# Patient Record
Sex: Female | Born: 1940 | Race: White | Hispanic: No | Smoking: Never smoker
Health system: Southern US, Community
[De-identification: ages and names within clinical notes are randomized; demographics above are authoritative.]

## PROBLEM LIST (undated history)

## (undated) DIAGNOSIS — I340 Nonrheumatic mitral (valve) insufficiency: Secondary | ICD-10-CM

## (undated) DIAGNOSIS — M4125 Other idiopathic scoliosis, thoracolumbar region: Secondary | ICD-10-CM

## (undated) DIAGNOSIS — N8111 Cystocele, midline: Secondary | ICD-10-CM

## (undated) DIAGNOSIS — Z9289 Personal history of other medical treatment: Secondary | ICD-10-CM

## (undated) DIAGNOSIS — G629 Polyneuropathy, unspecified: Secondary | ICD-10-CM

## (undated) DIAGNOSIS — G20A1 Parkinson's disease without dyskinesia, without mention of fluctuations: Secondary | ICD-10-CM

## (undated) DIAGNOSIS — M810 Age-related osteoporosis without current pathological fracture: Secondary | ICD-10-CM

## (undated) DIAGNOSIS — I10 Essential (primary) hypertension: Secondary | ICD-10-CM

## (undated) DIAGNOSIS — M1990 Unspecified osteoarthritis, unspecified site: Secondary | ICD-10-CM

## (undated) DIAGNOSIS — G8929 Other chronic pain: Secondary | ICD-10-CM

## (undated) DIAGNOSIS — G20 Parkinson's disease: Secondary | ICD-10-CM

## (undated) DIAGNOSIS — M545 Low back pain, unspecified: Secondary | ICD-10-CM

## (undated) DIAGNOSIS — K219 Gastro-esophageal reflux disease without esophagitis: Secondary | ICD-10-CM

## (undated) DIAGNOSIS — E785 Hyperlipidemia, unspecified: Secondary | ICD-10-CM

## (undated) DIAGNOSIS — K5790 Diverticulosis of intestine, part unspecified, without perforation or abscess without bleeding: Secondary | ICD-10-CM

## (undated) DIAGNOSIS — R2681 Unsteadiness on feet: Secondary | ICD-10-CM

## (undated) DIAGNOSIS — M25519 Pain in unspecified shoulder: Secondary | ICD-10-CM

## (undated) DIAGNOSIS — I480 Paroxysmal atrial fibrillation: Secondary | ICD-10-CM

## (undated) DIAGNOSIS — I341 Nonrheumatic mitral (valve) prolapse: Secondary | ICD-10-CM

## (undated) DIAGNOSIS — R928 Other abnormal and inconclusive findings on diagnostic imaging of breast: Secondary | ICD-10-CM

## (undated) HISTORY — DX: Gastro-esophageal reflux disease without esophagitis: K21.9

## (undated) HISTORY — DX: Age-related osteoporosis without current pathological fracture: M81.0

## (undated) HISTORY — DX: Other abnormal and inconclusive findings on diagnostic imaging of breast: R92.8

## (undated) HISTORY — DX: Personal history of other medical treatment: Z92.89

## (undated) HISTORY — DX: Unsteadiness on feet: R26.81

## (undated) HISTORY — DX: Hyperlipidemia, unspecified: E78.5

## (undated) HISTORY — DX: Low back pain, unspecified: M54.50

## (undated) HISTORY — DX: Diverticulosis of intestine, part unspecified, without perforation or abscess without bleeding: K57.90

## (undated) HISTORY — DX: Parkinson's disease: G20

## (undated) HISTORY — PX: VAGINAL HYSTERECTOMY: SUR661

## (undated) HISTORY — DX: Essential (primary) hypertension: I10

## (undated) HISTORY — PX: TONSILLECTOMY: SUR1361

## (undated) HISTORY — DX: Low back pain: M54.5

## (undated) HISTORY — DX: Cystocele, midline: N81.11

## (undated) HISTORY — DX: Parkinson's disease without dyskinesia, without mention of fluctuations: G20.A1

## (undated) HISTORY — DX: Polyneuropathy, unspecified: G62.9

## (undated) HISTORY — DX: Paroxysmal atrial fibrillation: I48.0

## (undated) HISTORY — DX: Other chronic pain: G89.29

## (undated) HISTORY — DX: Pain in unspecified shoulder: M25.519

## (undated) HISTORY — PX: TRANSTHORACIC ECHOCARDIOGRAM: SHX275

## (undated) HISTORY — DX: Unspecified osteoarthritis, unspecified site: M19.90

## (undated) HISTORY — DX: Nonrheumatic mitral (valve) prolapse: I34.1

## (undated) HISTORY — DX: Other idiopathic scoliosis, thoracolumbar region: M41.25

## (undated) HISTORY — PX: CYSTOCELE REPAIR: SHX163

## (undated) HISTORY — DX: Nonrheumatic mitral (valve) insufficiency: I34.0

---

## 1988-08-04 HISTORY — PX: COMBINED HYSTERECTOMY VAGINAL / OOPHORECTOMY / A&P REPAIR: SUR294

## 2000-01-03 ENCOUNTER — Encounter: Payer: Self-pay | Admitting: Obstetrics and Gynecology

## 2000-01-03 ENCOUNTER — Encounter: Admission: RE | Admit: 2000-01-03 | Discharge: 2000-01-03 | Payer: Self-pay | Admitting: Obstetrics and Gynecology

## 2000-01-08 ENCOUNTER — Other Ambulatory Visit: Admission: RE | Admit: 2000-01-08 | Discharge: 2000-01-08 | Payer: Self-pay | Admitting: Obstetrics and Gynecology

## 2001-02-18 ENCOUNTER — Encounter: Admission: RE | Admit: 2001-02-18 | Discharge: 2001-02-18 | Payer: Self-pay | Admitting: Obstetrics and Gynecology

## 2001-02-18 ENCOUNTER — Encounter: Payer: Self-pay | Admitting: Obstetrics and Gynecology

## 2001-03-01 ENCOUNTER — Encounter: Admission: RE | Admit: 2001-03-01 | Discharge: 2001-03-01 | Payer: Self-pay | Admitting: Internal Medicine

## 2001-03-17 ENCOUNTER — Encounter: Admission: RE | Admit: 2001-03-17 | Discharge: 2001-03-17 | Payer: Self-pay | Admitting: *Deleted

## 2001-03-17 ENCOUNTER — Encounter: Payer: Self-pay | Admitting: *Deleted

## 2001-03-31 ENCOUNTER — Encounter: Admission: RE | Admit: 2001-03-31 | Discharge: 2001-03-31 | Payer: Self-pay | Admitting: *Deleted

## 2001-03-31 ENCOUNTER — Encounter: Payer: Self-pay | Admitting: *Deleted

## 2002-02-02 ENCOUNTER — Other Ambulatory Visit: Admission: RE | Admit: 2002-02-02 | Discharge: 2002-02-02 | Payer: Self-pay | Admitting: Obstetrics and Gynecology

## 2002-02-22 ENCOUNTER — Encounter: Admission: RE | Admit: 2002-02-22 | Discharge: 2002-02-22 | Payer: Self-pay | Admitting: Obstetrics and Gynecology

## 2002-02-22 ENCOUNTER — Encounter: Payer: Self-pay | Admitting: Obstetrics and Gynecology

## 2002-03-01 ENCOUNTER — Encounter: Payer: Self-pay | Admitting: Obstetrics and Gynecology

## 2002-03-01 ENCOUNTER — Encounter: Admission: RE | Admit: 2002-03-01 | Discharge: 2002-03-01 | Payer: Self-pay | Admitting: Obstetrics and Gynecology

## 2002-10-03 HISTORY — PX: COLONOSCOPY: SHX174

## 2003-03-03 ENCOUNTER — Encounter: Admission: RE | Admit: 2003-03-03 | Discharge: 2003-03-03 | Payer: Self-pay | Admitting: Obstetrics and Gynecology

## 2003-03-03 ENCOUNTER — Encounter: Payer: Self-pay | Admitting: Obstetrics and Gynecology

## 2004-03-15 ENCOUNTER — Ambulatory Visit (HOSPITAL_COMMUNITY): Admission: RE | Admit: 2004-03-15 | Discharge: 2004-03-15 | Payer: Self-pay | Admitting: Obstetrics and Gynecology

## 2004-04-24 ENCOUNTER — Encounter: Payer: Self-pay | Admitting: Internal Medicine

## 2005-03-20 ENCOUNTER — Ambulatory Visit (HOSPITAL_COMMUNITY): Admission: RE | Admit: 2005-03-20 | Discharge: 2005-03-20 | Payer: Self-pay | Admitting: Obstetrics and Gynecology

## 2005-04-03 ENCOUNTER — Ambulatory Visit: Payer: Self-pay | Admitting: Internal Medicine

## 2005-04-29 ENCOUNTER — Encounter: Admission: RE | Admit: 2005-04-29 | Discharge: 2005-05-02 | Payer: Self-pay | Admitting: Orthopedic Surgery

## 2005-06-04 ENCOUNTER — Ambulatory Visit: Payer: Self-pay | Admitting: Family Medicine

## 2005-07-10 ENCOUNTER — Encounter: Admission: RE | Admit: 2005-07-10 | Discharge: 2005-07-25 | Payer: Self-pay | Admitting: Orthopedic Surgery

## 2005-09-24 ENCOUNTER — Ambulatory Visit: Payer: Self-pay

## 2006-01-20 ENCOUNTER — Ambulatory Visit: Payer: Self-pay | Admitting: Internal Medicine

## 2006-05-13 ENCOUNTER — Ambulatory Visit (HOSPITAL_COMMUNITY): Admission: RE | Admit: 2006-05-13 | Discharge: 2006-05-13 | Payer: Self-pay | Admitting: Obstetrics and Gynecology

## 2006-05-18 ENCOUNTER — Ambulatory Visit: Payer: Self-pay | Admitting: Internal Medicine

## 2006-05-21 ENCOUNTER — Ambulatory Visit: Payer: Self-pay | Admitting: Internal Medicine

## 2006-06-30 ENCOUNTER — Ambulatory Visit: Payer: Self-pay | Admitting: Internal Medicine

## 2006-06-30 LAB — CONVERTED CEMR LAB
Basophils Absolute: 0 10*3/uL (ref 0.0–0.1)
Hemoglobin: 13.7 g/dL (ref 12.0–15.0)
MCV: 88.3 fL (ref 78.0–100.0)
Neutro Abs: 2.3 10*3/uL (ref 1.4–7.7)
RBC: 4.53 M/uL (ref 3.87–5.11)
RDW: 12 % (ref 11.5–14.6)

## 2006-08-04 DIAGNOSIS — G629 Polyneuropathy, unspecified: Secondary | ICD-10-CM

## 2006-08-04 HISTORY — DX: Polyneuropathy, unspecified: G62.9

## 2007-03-30 ENCOUNTER — Ambulatory Visit: Payer: Self-pay | Admitting: Internal Medicine

## 2007-04-05 LAB — CONVERTED CEMR LAB
AST: 21 units/L (ref 0–37)
Cholesterol: 203 mg/dL (ref 0–200)
Triglycerides: 173 mg/dL — ABNORMAL HIGH (ref 0–149)
VLDL: 35 mg/dL (ref 0–40)

## 2007-04-06 ENCOUNTER — Encounter (INDEPENDENT_AMBULATORY_CARE_PROVIDER_SITE_OTHER): Payer: Self-pay | Admitting: *Deleted

## 2007-04-08 ENCOUNTER — Ambulatory Visit: Payer: Self-pay | Admitting: Internal Medicine

## 2007-04-08 DIAGNOSIS — M545 Low back pain, unspecified: Secondary | ICD-10-CM | POA: Insufficient documentation

## 2007-05-05 ENCOUNTER — Telehealth (INDEPENDENT_AMBULATORY_CARE_PROVIDER_SITE_OTHER): Payer: Self-pay | Admitting: *Deleted

## 2007-05-17 ENCOUNTER — Ambulatory Visit (HOSPITAL_COMMUNITY): Admission: RE | Admit: 2007-05-17 | Discharge: 2007-05-17 | Payer: Self-pay | Admitting: Obstetrics and Gynecology

## 2007-10-26 ENCOUNTER — Ambulatory Visit: Payer: Self-pay | Admitting: Internal Medicine

## 2007-11-01 LAB — CONVERTED CEMR LAB
ALT: 18 units/L (ref 0–35)
AST: 21 units/L (ref 0–37)
Cholesterol: 175 mg/dL (ref 0–200)
HDL: 85 mg/dL (ref >39.0)
Total CHOL/HDL Ratio: 2.1
VLDL: 18 mg/dL (ref 0–40)

## 2007-11-02 ENCOUNTER — Encounter (INDEPENDENT_AMBULATORY_CARE_PROVIDER_SITE_OTHER): Payer: Self-pay | Admitting: *Deleted

## 2007-11-23 ENCOUNTER — Encounter: Payer: Self-pay | Admitting: Internal Medicine

## 2007-11-23 ENCOUNTER — Ambulatory Visit: Payer: Self-pay | Admitting: Internal Medicine

## 2007-11-24 ENCOUNTER — Encounter: Payer: Self-pay | Admitting: Internal Medicine

## 2007-12-09 ENCOUNTER — Telehealth (INDEPENDENT_AMBULATORY_CARE_PROVIDER_SITE_OTHER): Payer: Self-pay | Admitting: *Deleted

## 2008-02-18 ENCOUNTER — Ambulatory Visit (HOSPITAL_BASED_OUTPATIENT_CLINIC_OR_DEPARTMENT_OTHER): Admission: RE | Admit: 2008-02-18 | Discharge: 2008-02-18 | Payer: Self-pay | Admitting: Urology

## 2008-05-17 ENCOUNTER — Ambulatory Visit (HOSPITAL_COMMUNITY): Admission: RE | Admit: 2008-05-17 | Discharge: 2008-05-17 | Payer: Self-pay | Admitting: Obstetrics and Gynecology

## 2008-06-08 ENCOUNTER — Ambulatory Visit: Payer: Self-pay | Admitting: Internal Medicine

## 2008-08-04 HISTORY — PX: BREAST EXCISIONAL BIOPSY: SUR124

## 2008-08-10 ENCOUNTER — Encounter
Admission: RE | Admit: 2008-08-10 | Discharge: 2008-08-10 | Payer: Self-pay | Admitting: Physical Medicine and Rehabilitation

## 2008-08-25 ENCOUNTER — Ambulatory Visit (HOSPITAL_COMMUNITY): Admission: RE | Admit: 2008-08-25 | Discharge: 2008-08-25 | Payer: Self-pay | Admitting: Obstetrics and Gynecology

## 2009-04-03 ENCOUNTER — Ambulatory Visit: Payer: Self-pay | Admitting: Internal Medicine

## 2009-04-03 DIAGNOSIS — M6740 Ganglion, unspecified site: Secondary | ICD-10-CM | POA: Insufficient documentation

## 2009-05-18 ENCOUNTER — Ambulatory Visit (HOSPITAL_COMMUNITY): Admission: RE | Admit: 2009-05-18 | Discharge: 2009-05-18 | Payer: Self-pay | Admitting: Internal Medicine

## 2009-05-29 ENCOUNTER — Telehealth (INDEPENDENT_AMBULATORY_CARE_PROVIDER_SITE_OTHER): Payer: Self-pay | Admitting: *Deleted

## 2009-05-29 ENCOUNTER — Encounter: Admission: RE | Admit: 2009-05-29 | Discharge: 2009-05-29 | Payer: Self-pay | Admitting: Internal Medicine

## 2009-05-30 ENCOUNTER — Encounter: Admission: RE | Admit: 2009-05-30 | Discharge: 2009-05-30 | Payer: Self-pay | Admitting: Internal Medicine

## 2009-05-30 ENCOUNTER — Encounter: Payer: Self-pay | Admitting: Internal Medicine

## 2009-06-04 HISTORY — PX: BREAST SURGERY: SHX581

## 2009-06-04 HISTORY — PX: OTHER SURGICAL HISTORY: SHX169

## 2009-06-11 ENCOUNTER — Encounter: Payer: Self-pay | Admitting: Internal Medicine

## 2009-06-22 ENCOUNTER — Encounter: Payer: Self-pay | Admitting: Internal Medicine

## 2009-06-25 ENCOUNTER — Ambulatory Visit (HOSPITAL_BASED_OUTPATIENT_CLINIC_OR_DEPARTMENT_OTHER): Admission: RE | Admit: 2009-06-25 | Discharge: 2009-06-25 | Payer: Self-pay | Admitting: General Surgery

## 2009-06-25 ENCOUNTER — Encounter: Admission: RE | Admit: 2009-06-25 | Discharge: 2009-06-25 | Payer: Self-pay | Admitting: General Surgery

## 2009-07-10 ENCOUNTER — Encounter: Payer: Self-pay | Admitting: Internal Medicine

## 2009-08-27 ENCOUNTER — Ambulatory Visit (HOSPITAL_COMMUNITY): Admission: RE | Admit: 2009-08-27 | Discharge: 2009-08-27 | Payer: Self-pay | Admitting: Obstetrics and Gynecology

## 2009-09-04 ENCOUNTER — Ambulatory Visit: Payer: Self-pay | Admitting: Internal Medicine

## 2009-09-04 DIAGNOSIS — M1710 Unilateral primary osteoarthritis, unspecified knee: Secondary | ICD-10-CM

## 2009-09-04 DIAGNOSIS — E785 Hyperlipidemia, unspecified: Secondary | ICD-10-CM | POA: Insufficient documentation

## 2009-09-04 DIAGNOSIS — G608 Other hereditary and idiopathic neuropathies: Secondary | ICD-10-CM | POA: Insufficient documentation

## 2009-09-04 DIAGNOSIS — IMO0002 Reserved for concepts with insufficient information to code with codable children: Secondary | ICD-10-CM | POA: Insufficient documentation

## 2009-09-04 DIAGNOSIS — G629 Polyneuropathy, unspecified: Secondary | ICD-10-CM

## 2009-09-04 DIAGNOSIS — R9431 Abnormal electrocardiogram [ECG] [EKG]: Secondary | ICD-10-CM | POA: Insufficient documentation

## 2009-09-04 HISTORY — PX: TOTAL KNEE ARTHROPLASTY: SHX125

## 2009-09-06 ENCOUNTER — Ambulatory Visit: Payer: Self-pay | Admitting: Internal Medicine

## 2009-09-06 DIAGNOSIS — M810 Age-related osteoporosis without current pathological fracture: Secondary | ICD-10-CM | POA: Insufficient documentation

## 2009-09-06 DIAGNOSIS — K5730 Diverticulosis of large intestine without perforation or abscess without bleeding: Secondary | ICD-10-CM | POA: Insufficient documentation

## 2009-09-07 ENCOUNTER — Encounter: Payer: Self-pay | Admitting: Internal Medicine

## 2009-09-07 LAB — CONVERTED CEMR LAB
ALT: 15 units/L (ref 0–35)
AST: 18 units/L (ref 0–37)
Basophils Absolute: 0 10*3/uL (ref 0.0–0.1)
CO2: 30 meq/L (ref 19–32)
Chloride: 109 meq/L (ref 96–112)
Eosinophils Absolute: 0.1 10*3/uL (ref 0.0–0.7)
Eosinophils Relative: 0.8 % (ref 0.0–5.0)
GFR calc non Af Amer: 105.54 mL/min (ref >60)
HDL: 105 mg/dL (ref >39.00)
Lymphocytes Relative: 29.1 % (ref 12.0–46.0)
MCV: 91.5 fL (ref 78.0–100.0)
Monocytes Absolute: 1 10*3/uL (ref 0.1–1.0)
Neutrophils Relative %: 55.6 % (ref 43.0–77.0)
Platelets: 197 10*3/uL (ref 150.0–400.0)
RBC: 4.38 M/uL (ref 3.87–5.11)
RDW: 12.6 % (ref 11.5–14.6)
Sodium: 143 meq/L (ref 135–145)
Total Bilirubin: 0.7 mg/dL (ref 0.3–1.2)
Total Protein: 6.9 g/dL (ref 6.0–8.3)
Triglycerides: 116 mg/dL (ref 0.0–149.0)

## 2009-09-12 ENCOUNTER — Telehealth (INDEPENDENT_AMBULATORY_CARE_PROVIDER_SITE_OTHER): Payer: Self-pay | Admitting: *Deleted

## 2009-09-13 ENCOUNTER — Ambulatory Visit: Payer: Self-pay

## 2009-09-13 ENCOUNTER — Encounter: Payer: Self-pay | Admitting: Cardiology

## 2009-09-13 ENCOUNTER — Ambulatory Visit: Payer: Self-pay | Admitting: Cardiology

## 2009-09-13 ENCOUNTER — Encounter (HOSPITAL_COMMUNITY): Admission: RE | Admit: 2009-09-13 | Discharge: 2009-11-07 | Payer: Self-pay | Admitting: Internal Medicine

## 2009-09-25 ENCOUNTER — Inpatient Hospital Stay (HOSPITAL_COMMUNITY): Admission: RE | Admit: 2009-09-25 | Discharge: 2009-09-27 | Payer: Self-pay | Admitting: Orthopedic Surgery

## 2009-10-23 ENCOUNTER — Encounter: Admission: RE | Admit: 2009-10-23 | Discharge: 2010-01-21 | Payer: Self-pay | Admitting: Orthopedic Surgery

## 2010-01-29 ENCOUNTER — Telehealth (INDEPENDENT_AMBULATORY_CARE_PROVIDER_SITE_OTHER): Payer: Self-pay | Admitting: *Deleted

## 2010-02-14 ENCOUNTER — Ambulatory Visit: Payer: Self-pay | Admitting: Internal Medicine

## 2010-02-14 ENCOUNTER — Telehealth (INDEPENDENT_AMBULATORY_CARE_PROVIDER_SITE_OTHER): Payer: Self-pay | Admitting: *Deleted

## 2010-02-21 ENCOUNTER — Ambulatory Visit: Payer: Self-pay | Admitting: Internal Medicine

## 2010-03-04 HISTORY — PX: CHOLECYSTECTOMY: SHX55

## 2010-03-12 ENCOUNTER — Ambulatory Visit: Payer: Self-pay | Admitting: Family

## 2010-03-13 ENCOUNTER — Telehealth: Payer: Self-pay | Admitting: Family

## 2010-03-13 ENCOUNTER — Encounter: Payer: Self-pay | Admitting: Family

## 2010-03-13 ENCOUNTER — Ambulatory Visit: Payer: Self-pay | Admitting: Radiology

## 2010-03-13 ENCOUNTER — Ambulatory Visit (HOSPITAL_BASED_OUTPATIENT_CLINIC_OR_DEPARTMENT_OTHER)
Admission: RE | Admit: 2010-03-13 | Discharge: 2010-03-13 | Payer: Self-pay | Source: Home / Self Care | Admitting: Internal Medicine

## 2010-03-14 ENCOUNTER — Encounter: Payer: Self-pay | Admitting: Internal Medicine

## 2010-03-14 ENCOUNTER — Telehealth: Payer: Self-pay | Admitting: Family

## 2010-03-14 ENCOUNTER — Telehealth (INDEPENDENT_AMBULATORY_CARE_PROVIDER_SITE_OTHER): Payer: Self-pay | Admitting: *Deleted

## 2010-03-14 LAB — CONVERTED CEMR LAB
Albumin: 4.2 g/dL (ref 3.5–5.2)
Amylase: 91 units/L (ref 0–105)
Basophils Relative: 0 % (ref 0–1)
Bilirubin, Direct: 0.3 mg/dL (ref 0.0–0.3)
Eosinophils Absolute: 0 10*3/uL (ref 0.0–0.7)
Eosinophils Relative: 1 % (ref 0–5)
Lymphs Abs: 1.5 10*3/uL (ref 0.7–4.0)
MCHC: 32.1 g/dL (ref 30.0–36.0)
Monocytes Absolute: 0.9 10*3/uL (ref 0.1–1.0)
Neutro Abs: 3 10*3/uL (ref 1.7–7.7)
Neutrophils Relative %: 55 % (ref 43–77)
Platelets: 201 10*3/uL (ref 150–400)
RBC: 4.28 M/uL (ref 3.87–5.11)

## 2010-03-15 ENCOUNTER — Telehealth: Payer: Self-pay | Admitting: Family

## 2010-04-09 ENCOUNTER — Encounter: Payer: Self-pay | Admitting: Internal Medicine

## 2010-04-10 ENCOUNTER — Ambulatory Visit: Payer: Self-pay | Admitting: Internal Medicine

## 2010-04-11 ENCOUNTER — Telehealth (INDEPENDENT_AMBULATORY_CARE_PROVIDER_SITE_OTHER): Payer: Self-pay | Admitting: *Deleted

## 2010-04-19 ENCOUNTER — Ambulatory Visit: Payer: Self-pay | Admitting: Internal Medicine

## 2010-04-22 LAB — CONVERTED CEMR LAB: Vit D, 25-Hydroxy: 75 ng/mL (ref 30–89)

## 2010-05-20 ENCOUNTER — Ambulatory Visit (HOSPITAL_COMMUNITY): Admission: RE | Admit: 2010-05-20 | Discharge: 2010-05-20 | Payer: Self-pay | Admitting: Internal Medicine

## 2010-08-25 ENCOUNTER — Encounter: Payer: Self-pay | Admitting: Obstetrics and Gynecology

## 2010-09-03 NOTE — Assessment & Plan Note (Signed)
Summary: Cardiology Nuclear Study  Nuclear Med Background Indications for Stress Test: Evaluation for Ischemia, Surgical Clearance, Abnormal EKG  Indications Comments: Pending (R) TKR on 09/25/09 by Dr. Durene Romans  History: Myocardial Perfusion Study, MVP  History Comments: > 5 years ago MPS:no ischemia, EF=60%  Symptoms: DOE, Palpitations    Nuclear Pre-Procedure Cardiac Risk Factors: Family History - CAD, Lipids Caffeine/Decaff Intake: None NPO After: 6:00 PM Lungs: Clear.  O2 Sat 97% on RA. IV 0.9% NS with Angio Cath: 20g     IV Site: (R) AC IV Started by: Stanton Kidney EMT-P Chest Size (in) 36     Cup Size D     Height (in): 63 Weight (lb): 145 BMI: 25.78  Nuclear Med Study 1 or 2 day study:  1 day     Stress Test Type:  Eugenie Birks Reading MD:  Marca Ancona, MD     Referring MD:  Marga Melnick, MD Resting Radionuclide:  Technetium 74m Tetrofosmin     Resting Radionuclide Dose:  10.6 mCi  Stress Radionuclide:  Technetium 21m Tetrofosmin     Stress Radionuclide Dose:  33.0 mCi   Stress Protocol   Lexiscan: 0.4 mg   Stress Test Technologist:  Rea College CMA-N     Nuclear Technologist:  Burna Mortimer Deal RT-N  Rest Procedure  Myocardial perfusion imaging was performed at rest 45 minutes following the intravenous administration of Myoview Technetium 78m Tetrofosmin.  Stress Procedure  The patient received IV Lexiscan 0.4 mg over 15-seconds.  Myoview injected at 30-seconds.  There were no significant changes with lexiscan, only occasional PVC's.  She did c/o of chest fullness.  Quantitative spect images were obtained after a 45 minute delay.  QPS Raw Data Images:  There is a breast shadow that accounts for the anterior attenuation. Stress Images:  Apical perfusion defect Rest Images:  Apical perfusion defect Subtraction (SDS):  Fixed apical defect.  Transient Ischemic Dilatation:  .93  (Normal <1.22)  Lung/Heart Ratio:  .24  (Normal <0.45)  Quantitative Gated Spect  Images QGS EDV:  79 ml QGS ESV:  25 ml QGS EF:  69 % QGS cine images:  Normal wall motion.    Overall Impression  Exercise Capacity: Lexiscan study.  BP Response: Normal blood pressure response. Clinical Symptoms: Chest fullness.  ECG Impression: Occasional PVC, no ischemic changes Overall Impression: Fixed apical perfusion defect with no ischemia.  I suspect this is breast attenuation (significant shadow on planar images) versus apical thinning.  Overall Impression Comments: Probably normal study.   Appended Document: Cardiology Nuclear Study No suggestion of unstable coronary artery disease. OK for Orthopedic surgery 09/25/2009 by Dr Charlann Boxer  Appended Document: Cardiology Nuclear Study mailed

## 2010-09-03 NOTE — Assessment & Plan Note (Signed)
Summary: surgical clearence/ns/kdc   Vital Signs:  Patient profile:   70 year old female Height:      63 inches Weight:      144.8 pounds BMI:     25.74 Temp:     98.1 degrees F oral Pulse rate:   76 / minute Resp:     14 per minute BP sitting:   102 / 66  (left arm) Cuff size:   large  Vitals Entered By: Shonna Chock (September 04, 2009 2:12 PM) CC: Right Knee Replacement surgical clearance, Pre-op Evaluation Comments REVIEWED MED LIST, PATIENT AGREED DOSE AND INSTRUCTION CORRECT    CC:  Right Knee Replacement surgical clearance and Pre-op Evaluation.  History of Present Illness: She is scheduled for R TKR 09/25/2009 by Dr Charlann Boxer  for intractable pain due to "bone on bone"  which affects walking. Rx to date:S/P bilaterall steroid injections & removal of L knee effusion. Glucosamine minimally helpful. Gabapentin has  helped neuropathic pain  in feet but not knee pain .The patient denies respiratory symptoms, GI bleeding, chest pain, edema, PND, heavy ETOH use, and smoking.  Patient has no history of acute or recent MI, unstable or severe angina, decompensated CHF, high grade AV block, symptomatic ventricular arrhythmia, and severe valvular disease.  Positive PMH placing the patient at moderate risk for surgery includes abnormal ECG(l).Her preop(Lumpectomy)  EKG 06/22/2009 : inverted T wave 1 & aVL & unifocal PVCsThese changes were NOT present on 04/24/2004 EKG.Remotely she has had a cardiac stress test which was negative. Lumpectomy was completed w/o Cardiology consult.  Patient has no history of mild angina(m), previous MI(m), compensated CHF(m), diabetes(m), renal insufficiency(m), advanced age(l), and rhythm other than sinus(l).  There is no history of antiplatelet agents, chronic steroids, warfarin, diabetes meds, antianginal meds, and bleeding disorder.    Preventive Screening-Counseling & Management  Alcohol-Tobacco     Smoking Status: never  Caffeine-Diet-Exercise     Does Patient  Exercise: no  Allergies: 1)  ! Sulfa  Past History:  Past Medical History: DDD of back with Lumbar Stenosis ; Motor Neuropathy Hyperlipidemia: NMR 01/2006: LDL 109(1466/578),TG 132,HDL 99. LDL goal = < 100, ideally < 75 Osteoporosis: T score -3.44 2 spine 2001 Diverticulosis, colon  Past Surgical History: epidurals X 2 LS spine; G3 P3; Lumpectomy 06/2009 "fatty necrosis" Tonsillectomy Hysterectomy for "uterine deviation"  Family History: Father: MI @ 55,d CVA @ 81 Mother: ovarian CA d @ 60 Siblings:bro COAD, CAD, Endocarditis   Social History: Retired Married Never Smoked Alcohol use-no Regular exercise-no Does Patient Exercise:  no  Review of Systems General:  Denies fatigue and sleep disorder. Eyes:  Denies blurring, double vision, and vision loss-both eyes. ENT:  Denies difficulty swallowing and hoarseness. CV:  Denies leg cramps with exertion, lightheadness, and near fainting. Resp:  Denies cough, shortness of breath, sputum productive, and wheezing. GI:  Denies abdominal pain, bloody stools, dark tarry stools, and indigestion. GU:  Denies discharge, dysuria, and hematuria. MS:  See HPI; Complains of joint pain and joint swelling; denies joint redness. Derm:  Denies changes in nail beds, dryness, hair loss, and lesion(s). Neuro:  Complains of numbness and tingling; N&T and burning in feet from neuropathy. Endo:  Denies cold intolerance, excessive hunger, excessive thirst, excessive urination, and heat intolerance. Heme:  Denies abnormal bruising and bleeding.  Physical Exam  General:  well-nourished,in no acute distress; alert,appropriate and cooperative throughout examination Head:  Normocephalic and atraumatic without obvious abnormalities.  Neck:  No deformities, masses, or  tenderness noted. Lungs:  Normal respiratory effort, chest expands symmetrically. Lungs are clear to auscultation, no crackles or wheezes. Heart:  Normal rate and regular rhythm. S1 and  S2 normal without gallop,  click, rub. S4.grade 1/2-1  /6 systolic murmur L base.   Abdomen:  Bowel sounds positive,abdomen soft and non-tender without masses, organomegaly or hernias noted. Msk:  No deformity or scoliosis noted of thoracic or lumbar spine.   Pulses:  R and L carotid,radial,dorsalis pedis and posterior tibial pulses are full and equal bilaterally Extremities:  No clubbing, cyanosis, edema. High arches. Crepitus & varus  deviation with ROM R knee Neurologic:  alert & oriented X3.   Skin:  Intact without suspicious lesions or rashes Cervical Nodes:  No lymphadenopathy noted Axillary Nodes:  No palpable lymphadenopathy Psych:  memory intact for recent and remote, normally interactive, and good eye contact.     Impression & Recommendations:  Problem # 1:  OSTEOARTHRITIS, KNEE, RIGHT, SEVERE (ICD-715.96) End stage                                                                                                                                                                                                                              Her updated medication list for this problem includes:    Tramadol Hcl 50 Mg Tabs (Tramadol hcl) .Marland Kitchen... 1 q 6 hrs as needed for  pain  Problem # 2:  ABNORMAL ELECTROCARDIOGRAM (ICD-794.31)  new T wave inversion & unifocal PVCs since 2005  Orders: Cardiology Referral (Cardiology)  Problem # 3:  HYPERLIPIDEMIA (ICD-272.4)  Her updated medication list for this problem includes:    Pravastatin Sodium 20 Mg Tabs (Pravastatin sodium) .Marland Kitchen... 1 by mouth mon, wed, fri  Problem # 4:  ATHEROSCLEROTIC HEART DISEASE, FAMILY HX (ICD-V17.3) Father premature CAD/MI Orders: Cardiology Referral (Cardiology)  Problem # 5:  OSTEOPOROSIS (ICD-733.00)  Problem # 6:  PERIPHERAL NEUROPATHY (ICD-356.9) stable on gabapentin  Complete Medication List: 1)  Pravastatin Sodium 20 Mg Tabs (Pravastatin sodium) .Marland Kitchen.. 1 by mouth mon, wed, fri 2)  Menest 0.625  3)  Calcium  1700mg  Qd  4)  Vit D 4000 Iuqd  5)  Glucosamine Sulfate 2 Tabs Daily  6)  Basa  7)  Tramadol Hcl 50 Mg Tabs (Tramadol hcl) .Marland Kitchen.. 1 q 6 hrs as needed for  pain 8)  Gabapentin 100 Mg Caps (Gabapentin) .Marland Kitchen.. 1 by mouth two times a day 9)  Vitamin C 500 Mg  Tabs (Ascorbic acid) .Marland Kitchen.. 1 by mouth once daily  Patient Instructions: 1)  Fasting labs:BMP ;Hepatic Panel ;Lipid Panel ;CBC w/ Diff. Rest stress test necessary pre op due to new EKG changes, cholesterol elevation, & family history of premature heart disease.

## 2010-09-03 NOTE — Progress Notes (Signed)
Summary: Test Results  Phone Note Outgoing Call   Call placed by: Lemont Fillers FNP,  March 14, 2010 10:27 AM Summary of Call: Please call patient and let her know that her liver function tests are elevated.  I suspect that this is due to her gallbladder.  I would like for her to complete a HIDA scan today if possible.  I have ordered.   I would also like for her to see a surgeon for further evaluation and follow up with Dr. Alwyn Ren on Monday.  If she develops worsening abdominal pain, then she should go to the ER.   Initial call taken by: Lemont Fillers FNP,  March 14, 2010 10:30 AM  Follow-up for Phone Call        call placed to patient at [phone], she has been advised per [facility] instructions Follow-up by: Glendell Docker CMA,  March 14, 2010 11:14 AM

## 2010-09-03 NOTE — Assessment & Plan Note (Signed)
Summary: YEARLY EXAM////SPH   Vital Signs:  Patient profile:   70 year old female Height:      63.5 inches Weight:      149.2 pounds BMI:     26.11 Temp:     97.9 degrees F oral Pulse rate:   76 / minute Resp:     14 per minute BP sitting:   128 / 78  (left arm) Cuff size:   large  Vitals Entered By: Shonna Chock CMA (February 14, 2010 1:06 PM) CC: Yearly, Lipid Management Comments REVIEWED MED LIST, PATIENT AGREED DOSE AND INSTRUCTION CORRECT    CC:  Yearly and Lipid Management.  History of Present Illness: Here for Medicare AWV: 1.Risk factors based on Past M, S, F history:neuropathy;Osteoporosis;dyslipidemia 2.Physical Activities: see Entry 3.Depression/mood: denied 4.Hearing: whisper heard @ 6 ft 5.ADL's: no limitations 6.Fall Risk: see Entry data 7.Home Safety: see data 8.Height, weight, &visual acuity:distant vision WNL reading wall chart 9.Counseling: none requested 10.Labs ordered based on risk factors: see Orders 11.Referral Coordination: Rheumatology referral requested 12. Care Plan: see Instructions; POA up to date 13.Cognitive Assessment: Orientation(X3), memory & recall , mood  WNL. "WORLD" spelled backwards Hyperlipidemia Follow-Up      This is a 70 year old woman who also presents for Hyperlipidemia follow-up.  The patient reports muscle aches, but denies GI upset, abdominal pain, flushing, itching, constipation, diarrhea, and fatigue.  Other symptoms include exercise intolerance due to easy  fatiguability & LBP  with exercise(not @ rest)  and occasioanl palpitations.  The patient denies the following symptoms: chest pain/pressure, dypsnea, syncope, and pedal edema.  Compliance with medications (by patient report) has been near 100%.  Dietary compliance has been fair.  The patient reports no exercise.  Labs & EKG were done pre TKR in 09/2009; labs reviewed  Lipid Management History:      Positive NCEP/ATP III risk factors include female age 20 years old or older  and family history for ischemic heart disease (males less than 76 years old).  Negative NCEP/ATP III risk factors include non-diabetic, HDL cholesterol greater than 60, non-tobacco-user status, non-hypertensive, no ASHD (atherosclerotic heart disease), no prior stroke/TIA, no peripheral vascular disease, and no history of aortic aneurysm.     Preventive Screening-Counseling & Management  Alcohol-Tobacco     Alcohol drinks/day: 0     Smoking Status: never  Caffeine-Diet-Exercise     Caffeine use/day: 1diet  coke qd      Diet Comments: no diet     Does Patient Exercise: no due to hip & back pain  Hep-HIV-STD-Contraception     Dental Visit-last 6 months yes     Sun Exposure-Excessive: no  Safety-Violence-Falls     Seat Belt Use: yes     Firearms in the Home: firearms in the home     Firearm Counseling: not indicated; uses recommended firearm safety measures     Smoke Detectors: yes     Violence in the Home: no risk noted     Sexual Abuse: no     Fall Risk: neuropathy & DJD; wears tennis shoes; PT completed      Sexual History:  currently monogamous.        Drug Use:  never.        Blood Transfusions:  no.        Travel History:  no travel.    Allergies: 1)  ! Sulfa  Past History:  Past Medical History: DDD of back with Lumbar Stenosis ; Motor Neuropathy Hyperlipidemia:  NMR 01/2006: LDL 109(1466/578),TG 132,HDL 99. LDL goal = < 100, ideally < 75. Framingham Study LDL goal = < 160. 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, Dr Charlann Boxer  Family History: Father: MI @ 55,d CVA @ 28 Mother: ovarian cancer, d @ 68 Siblings:bro :COAD, CAD, Endocarditis ; son : MI @ 38(smoker)  Social History: Caffeine use/day:  1diet  coke qd  Does Patient Exercise:  no due to hip &  back pain Dental Care w/in 6 mos.:  yes Sun Exposure-Excessive:  no Seat Belt Use:  yes Fall Risk:  neuropathy & DJD; wears tennis shoes; PT completed Sexual History:  currently monogamous Drug Use:  never Blood Transfusions:  no  Review of Systems  The patient denies anorexia, fever, vision loss, decreased hearing, hoarseness, prolonged cough, headaches, hemoptysis, abdominal pain, melena, hematochezia, severe indigestion/heartburn, hematuria, incontinence, suspicious skin lesions, depression, unusual weight change, abnormal bleeding, enlarged lymph nodes, and angioedema.         Weight up w/o CVE  Physical Exam  General:  well-nourished,in no acute distress; alert,appropriate and cooperative throughout examination Head:  Normocephalic and atraumatic without obvious abnormalities.  Eyes:  No corneal or conjunctival inflammation noted.Perrla. Funduscopic exam benign, without hemorrhages, exudates or papilledema. Ears:  External ear exam shows no significant lesions or deformities.  Otoscopic examination reveals clear canals, tympanic membranes are intact bilaterally without bulging, retraction, inflammation or discharge. Hearing is grossly normal bilaterally. Nose:  External nasal examination shows no deformity or inflammation. Nasal mucosa are pink and moist without lesions or exudates. Mouth:  Oral mucosa and oropharynx without lesions or exudates.  Teeth in good repair. Neck:  No deformities, masses, or tenderness noted. Lungs:  Normal respiratory effort, chest expands symmetrically. Lungs are clear to auscultation, no crackles or wheezes. Heart:  normal rate, regular rhythm, no gallop, no rub, no JVD, no HJR, and grade 1/2-1/6 systolic murmur.   Abdomen:  Bowel sounds positive,abdomen soft and non-tender without masses, organomegaly or hernias noted. Genitalia:  Dr Genice Rouge Msk:  No deformity or scoliosis noted of thoracic or lumbar spine but R lower paraspinus muscles > L.   Pulses:   R and L carotid,radia,dorsalis pedis and posterior tibial pulses are full and equal bilaterally Extremities:  No clubbing, cyanosis, edema, or deformity noted with normal full range of motion of all joints.   Neg SLR to 90 degrees Neurologic:  alert & oriented X3 and DTRs symmetrical and normal.   Skin:  Intact without suspicious lesions or rashes Cervical Nodes:  No lymphadenopathy noted Axillary Nodes:  No palpable lymphadenopathy Psych:  Oriented X3, memory intact for recent and remote, normally interactive, good eye contact, not anxious appearing, and not depressed appearing.     Impression & Recommendations:  Problem # 1:  PREVENTIVE HEALTH CARE (ICD-V70.0)  Orders: Subsequent annual wellness visit with prevention plan ([id number])  Problem # 2:  LOW BACK PAIN, CHRONIC (ICD-724.2)  The following medications were removed from the medication list:    Tramadol Hcl 50 Mg Tabs (Tramadol hcl) .Marland Kitchen... 1 q 6 hrs as needed for  pain  Orders: Orthopedic Referral (Ortho)  Problem # 3:  OSTEOPOROSIS (ICD-733.00)  Problem # 4:  HYPERLIPIDEMIA (ICD-272.4)  Her updated medication list for this problem includes:    Pravastatin Sodium 20 Mg Tabs (Pravastatin sodium) .Marland Kitchen... 1 by  mouth mon, wed, fri  Problem # 5:  PERIPHERAL NEUROPATHY (ICD-356.9)  Complete Medication List: 1)  Pravastatin Sodium 20 Mg Tabs (Pravastatin sodium) .Marland Kitchen.. 1 by mouth mon, wed, fri 2)  Menest 0.625  3)  Calcium 1700mg  Qd  4)  Vit D2 1.25  .... Once weekly 5)  Glucosamine Sulfate 2 Tabs Daily  6)  Basa  7)  Gabapentin 100 Mg Caps (Gabapentin) .Marland Kitchen.. 1am, 1noon, 2 at bedtime 8)  Vitamin C 500 Mg Tabs (Ascorbic acid) .Marland Kitchen.. 1 by mouth once daily  Other Orders: Tdap => 64yrs IM (36644) Admin 1st Vaccine (03474)  Lipid Assessment/Plan:      Based on NCEP/ATP III, the patient's risk factor category is "0-1 risk factors".  The patient's lipid goals are as follows: Total cholesterol goal is 200; LDL cholesterol goal is 160; HDL  cholesterol goal is 40; Triglyceride goal is 150.  Her LDL cholesterol goal has been met.    Patient Instructions: 1)  BMD every 25 months(scheduled 07/21).Take an Aspirin every day.Keep appt with Dr Ethelene Hal.Check vitamin D level annually @ Gyn   Immunizations Administered:  Tetanus Vaccine:    Vaccine Type: Tdap    Site: right deltoid    Mfr: GlaxoSmithKline    Dose: 0.5 ml    Route: IM    Given by: Shonna Chock CMA    Exp. Date: 10/27/2011    Lot #: [id number]    VIS given: 06/22/07 version given February 14, 2010.

## 2010-09-03 NOTE — Assessment & Plan Note (Signed)
Summary: stomache pain /cbs per chrae--Rm 5   Vital Signs:  Patient profile:   70 year old female Height:      63.5 inches Weight:      149.25 pounds BMI:     26.12 Temp:     97.5 degrees F oral Pulse rate:   84 / minute Pulse rhythm:   regular Resp:     16 per minute BP sitting:   124 / 78  (right arm) Cuff size:   regular  Vitals Entered By: Cassie White CMA Cassie White) (March 12, 2010 3:04 PM) CC: Room 5   Pt states she had episode of severe abdominal pain around noon today, very sharp & stabbing. Also noticed some nausea, symptoms seem to be easing now. Is Patient Diabetic? No Pain Assessment Patient in pain? yes     Location: abdomen Intensity: 10 Type: stabbing Onset of pain  noon today   CC:  Room 5   Pt states she had episode of severe abdominal pain around noon today, very sharp & stabbing. Also noticed some nausea, and symptoms seem to be easing now..  History of Present Illness: Cassie White is a 70 year old female here today with complaint of abdominal pain. Oset of symptoms was  24 hours ago. Course has been sudden onset and now occurs in intermittent  pattern. Problem precipitated by prolonged sitting or empty stomach. Symptom characterized as stabbing pain in her epigastric area- which today has lasted approximately 3 hours- now completely resolved at time of examination. Symptom radiates to her back- she denies chest pain or shortness of breath. Problem associated with  nausea. Symptoms are  improved by nothing. Patietn denies prior hx of same symptoms. Denies fever.  Last BM was this AM- denies melena or BRBPR.  Denies NSAID use. Denies abdominal distension.     Allergies: 1)  ! Sulfa  Past History:  Past Medical History: Last updated: 02/14/2010 DDD of back with Lumbar Stenosis ; Motor Neuropathy Hyperlipidemia: NMR 01/2006: LDL 109(1466/578),TG 132,HDL 99. LDL goal = < 100, ideally < 75. Framingham Study LDL goal = < 160. 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 Father: MI @ 55,d CVA @ 15 Mother: ovarian cancer, d @ 45 Siblings:bro :COAD, CAD, Endocarditis ; son : MI @ 38(smoker)  Social History: Reviewed history from 09/04/2009 and no changes required. Retired Married Never Smoked Alcohol use-no Regular exercise-no  Review of Systems       see HPI  Physical Exam  General:  Well-developed,well-nourished,in no acute distress; alert,appropriate and cooperative throughout examination Lungs:  Normal respiratory effort, chest expands symmetrically. Lungs are clear to auscultation, no crackles or wheezes. Heart:  Normal rate and regular rhythm. S1 and S2 normal without gallop, murmur, click, rub or other  extra sounds. Abdomen:  Abdomen is soft, non-tender, non-distended.  + BS's, negative murphy's sign.  No acute abdominal findings. Psych:  Cognition and judgment appear intact. Alert and cooperative with normal attention span and concentration. No apparent delusions, illusions, hallucinations   Impression & Recommendations:  Problem # 1:  EPIGASTRIC PAIN (ICD-789.06) Assessment New EKG notes normal sinus rythm  without acute changes.  Will send for abdominal ultrasound and below laboratories.  Differential includes acute cholecystitis, pancreatitis, PUD/GERD.  Will also start empiric PPI, pt instructed to call if recurrent symptoms.  Otherwise, plan f/u in 1 month with Dr. Alwyn Ren.  Can consider GI referral at that time. Orders: T-CBC w/Diff [phone]) T-Amylase [phone]) T-Lipase [phone]) T-Hepatic Function [phone]) EKG w/ Interpretation (93000) Abdomen Ultrasound ([facility])  Complete Medication List: 1)  Pravastatin Sodium 20 Mg Tabs (Pravastatin sodium) .Marland Kitchen.. 1 by mouth mon, wed, fri 2)  Menest 0.625  3)  Calcium 1700mg  Daily  4)  Vit D2 1.25  .... Once weekly 5)  Glucosamine Sulfate 2 Tabs Daily  6)  Ecotrin Low Strength 81 Mg Tbec (Aspirin) .... Take 1 tablet by mouth once a day 7)  Gabapentin 100 Mg Caps (Gabapentin) .Marland Kitchen.. 1am, 1noon, 2 at bedtime 8)  Vitamin C 500 Mg Tabs (Ascorbic acid) .Marland Kitchen.. 1 by mouth once daily 9)  Methocarbamol 500 Mg Tabs (Methocarbamol) .... As needed. 10)  Prilosec Otc 20 Mg Tbec (Omeprazole magnesium) .... 2 tabs by mouth daily  Patient Instructions: 1)  Start prilosec. 2)  Complete your blood work downstairs today. 3)  Complete your ultrasound in the AM tomorrow. 4)  Go to ER if you develop recurrent/worsening abdominal pain, black or bloody stools. 5)  Call if you develop fever. 6)  Follow up with Dr. Alwyn Ren in 2 weeks, sooner if symptoms worsen.  Current Allergies (reviewed today): ! SULFA

## 2010-09-03 NOTE — Progress Notes (Signed)
  Phone Note Outgoing Call   Call placed by: Lemont Fillers FNP,  March 15, 2010 1:32 PM Call placed to: Patient Summary of Call: Called patient to see how she is feeling.  She reports that she is feeling well.  She saw Dr. Michaell Cowing yesterday and he has recommended cholecystectomy which is scheduled for next Friday. Initial call taken by: Lemont Fillers FNP,  March 15, 2010 1:33 PM

## 2010-09-03 NOTE — Progress Notes (Signed)
Summary: Refill Request  Phone Note Refill Request Message from:  Patient on February 14, 2010 2:58 PM  Refills Requested: Medication #1:  PRAVASTATIN SODIUM 20 MG  TABS 1 by mouth mon   Dosage confirmed as above?Dosage Confirmed   [facility] [place]  Next Appointment Scheduled: none Initial call taken by: Lavell Islam,  February 14, 2010 2:58 PM    Prescriptions: PRAVASTATIN SODIUM 20 MG  TABS (PRAVASTATIN SODIUM) 1 by mouth mon, wed, fri  #90 Each x 2   Entered by:   Shonna Chock CMA   Authorized by:   Marga Melnick MD   Signed by:   Shonna Chock CMA on 02/14/2010   Method used:   [facility]ronically to        [phone][facility] (retail)       [address]       [place], [place]  [address]       Ph: [phone] or [phone]       Fax: [phone]   RxID:   [id number]

## 2010-09-03 NOTE — Assessment & Plan Note (Signed)
Summary: DISCUSS TREATMENT/KN   Vital Signs:  Patient profile:   70 year old female Weight:      144.4 pounds BMI:     25.27 Temp:     97.6 degrees F oral Pulse rate:   72 / minute Resp:     14 per minute BP sitting:   100 / 66  (left arm) Cuff size:   regular  Vitals Entered By: Shonna Chock CMA (April 10, 2010 9:33 AM) CC: Discuss Osteoprosis treatment   CC:  Discuss Osteoprosis treatment.  History of Present Illness: BMD reviewed : frank Osteoporosis @ spine & femoral neck. Ca++ 9.7 in 09/2009. T score was -3.44 in 2001  & now -2.7 in 2011. No vit D on record; she is taking 500 International Units once daily of vitamin D?3. Walking has been curtailed because of surgeries. PMH fractured arm age 79. No  FH  Osteoporosis. PMH of bone building therapy (Fosamax ) X  years.. No significant GI issues or symptoms.  Current Medications (verified): 1)  Pravastatin Sodium 20 Mg  Tabs (Pravastatin Sodium) .Marland Kitchen.. 1 By Mouth Mon, Wed, Fri 2)  Menest 0.625 3)  Calcium 1700mg  Daily 4)  Vit D2 1.25 .... Once Weekly 5)  Glucosamine Sulfate 2 Tabs Daily 6)  Ecotrin Low Strength 81 Mg Tbec (Aspirin) .... Take 1 Tablet By Mouth Once A Day 7)  Gabapentin 100 Mg Caps (Gabapentin) .Marland Kitchen.. 1am, 1noon, 2 At Bedtime 8)  Vitamin C 500 Mg Tabs (Ascorbic Acid) .Marland Kitchen.. 1 By Mouth Once Daily 9)  Methocarbamol 500 Mg Tabs (Methocarbamol) .... As Needed. 10)  Prilosec Otc 20 Mg Tbec (Omeprazole Magnesium) .... 2 Tabs By Mouth Daily  Allergies: 1)  ! Sulfa  Past History:  Past Medical History: DDD of back with Lumbar Stenosis ; Motor Neuropathy Hyperlipidemia: NMR 01/2006: LDL 109(1466/578),TG 132,HDL 99. LDL goal = < 100, ideally < 75. Framingham Study LDL goal = < 160. Osteoporosis: T score -3.44 @ spine in  2001; post ? 10 years of Fosamax . T score - 2.7  @ spine 02/2010 & -2.8 @ femoral neck. 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 Slightly decreased ROM R knee. Crepitus bilaterally. No significant DJD of hands   Impression & Recommendations:  Problem # 1:  OSTEOPOROSIS (ICD-733.00)  S/P > 5 years Bisphosphonate therapy; serial BMDs improved but Osteoporosis persists.  Orders: Venipuncture (16010) T-Vitamin D (25-Hydroxy) [phone])  Complete Medication List: 1)  Pravastatin Sodium 20 Mg Tabs (Pravastatin sodium) .Marland Kitchen.. 1 by mouth mon, wed, fri 2)  Menest 0.625  3)  Calcium 1700mg  Daily  4)  Vit D2 1.25  .... Once weekly 5)  Glucosamine Sulfate 2 Tabs Daily  6)  Ecotrin Low Strength 81 Mg Tbec (Aspirin) .... Take 1 tablet by mouth once a day 7)  Gabapentin 100 Mg Caps (Gabapentin) .Marland Kitchen.. 1am, 1noon, 2 at bedtime 8)  Vitamin C 500 Mg Tabs (Ascorbic acid) .Marland Kitchen.. 1 by mouth once daily 9)  Methocarbamol 500 Mg Tabs (Methocarbamol) .... As needed. 10)  Prilosec Otc 20 Mg Tbec (Omeprazole magnesium) .... 2 tabs by mouth daily  Patient Instructions: 1)  Ca++ 600 mg two times a day . Walking 30 min3-4X/ week. BMD every 25 months. (Note: on vit D 2   500 International Units once daily ). IV agents for Osteoporosis if it progresses off Fosamax.

## 2010-09-03 NOTE — Progress Notes (Signed)
Summary: Labs Due Per Dr.Hopper  Phone Note Outgoing Call Call back at [facility] Phone [phone]   Call placed by: Shonna Chock CMA,  March 14, 2010 4:39 PM Call placed to: Patient Details for Reason: Lab Appointment Due Summary of Call: Left message on VM informing patient to call to schedule lab appointment for tomorrow per Dr.Hopper  Recommend fasting LFTs , amylase & lipase in am 08/12 (789.00) Chrae Malloy CMA  March 14, 2010 4:39 PM

## 2010-09-03 NOTE — Letter (Signed)
Summary: Surgical Clearance/[facility]  Surgical Clearance/[facility]   Imported By: Lanelle Bal 09/14/2009 08:58:21  _____________________________________________________________________  External Attachment:    Type:   Image     Comment:   External Document

## 2010-09-03 NOTE — Progress Notes (Signed)
Summary: u/s result  Phone Note Outgoing Call   Call placed by: Lemont Fillers FNP,  March 13, 2010 1:10 PM Call placed to: Patient Summary of Call: Left message on patient's cell phone for her to call us to discuss her ultrasound results.  Ultrasound shows gallstones- which may or may not be causing her pain.  If she is still having symptoms I would like for her to complete a HIDA scan which would give [place] more info o whether or not these gallstones are really contributing to her pain.  Also,  did she complete lab work?  If not I would like her to do so please. Initial call taken by: Lemont Fillers FNP,  March 13, 2010 1:12 PM  Follow-up for Phone Call        Pt advised per [facility] instructions. Pt states she has not had pain since she saw [place] last. Has appt today at 3pm with surgeon.  Nicki Guadalajara Fergerson CMA Duncan Dull)  March 14, 2010 12:56 PM

## 2010-09-03 NOTE — Consult Note (Signed)
Summary: [facility]   Imported By: Lanelle Bal 04/01/2010 13:10:21  _____________________________________________________________________  External Attachment:    Type:   Image     Comment:   External Document

## 2010-09-03 NOTE — Progress Notes (Signed)
Summary: Nuclear Pre-Procedure  Phone Note Outgoing Call Call back at [facility] Phone [phone]   Call placed by: Stanton Kidney, EMT-P,  September 12, 2009 2:50 PM Call placed to: Patient Action Taken: Phone Call Completed Summary of Call: Reviewed information on Myoview Information Sheet (see scanned document for further details).  Spoke with Patient.    Nuclear Med Background Indications for Stress Test: Evaluation for Ischemia, Surgical Clearance, Abnormal EKG  Indications Comments: Pending (R) TKR: 09/25/09; Dr. Luna Fuse  History: Myocardial Perfusion Study, MVP  History Comments: > 5 years ago MPS: EF=60%, (-) ischemia     Nuclear Pre-Procedure Cardiac Risk Factors: Family History - CAD, Lipids Height (in): 63

## 2010-09-03 NOTE — Progress Notes (Signed)
Summary: INFO ON VIT D2 PER REQUEST FROM DR HOPPER  Phone Note Call from Patient Call back at Home Phone [phone]   Caller: Patient Summary of Call: PATIENT SAYS SHE SAW DR HOPPER RECEENTLY AND HE ASKED HER ABOUT VIT D THAT SHE WAS TAKING  SHE IS TAKING VIT D2--1.25 MG   SAYS BOTTLE SAYS 50,000 UNITS ---TAKES PILL ONE TIME PER WEEK Initial call taken by: Jerolyn Shin,  April 11, 2010 2:39 PM  Follow-up for Phone Call        pt aware med list updated.............Marland KitchenFelecia Deloach CMA  April 12, 2010 9:06 AM     New/Updated Medications: VITAMIN D (ERGOCALCIFEROL) 50000 UNIT CAPS (ERGOCALCIFEROL) Take 1 tab once weekly

## 2010-09-03 NOTE — Miscellaneous (Signed)
Summary: BONE DENSITY  Clinical Lists Changes  Orders: Added new Test order of T-Bone Densitometry (77080) - Signed Added new Test order of T-Lumbar Vertebral Assessment (77082) - Signed 

## 2010-09-03 NOTE — Progress Notes (Signed)
Summary: NEED LABS FOR YEARLY EXAM  Phone Note Call from Patient   Caller: Patient Summary of Call: PATIENT HAS BEEN SCHEDULED FOR A YEARLY EXAM ON 7/14, SO HER LABS ARE FOR 7/7 AT 8AM  WHAT LABS DO YOU NEED FOR HER??  LET ME KNOW AND I WILL ADD THEM IN.....THANKS Initial call taken by: Jerolyn Shin,  January 29, 2010 5:03 PM  Follow-up for Phone Call        Patient had surgical [place] 09/2009 and had labs drawn. No repeat on labs indicated. If patient needs any additional labs Dr.Hopper will indicate at OV Follow-up by: Shonna Chock,  January 30, 2010 8:34 AM  Additional Follow-up for Phone Call Additional follow up Details #1::        SPOKE TO MS Berrocal--ADVISED HER THAT SHE DID NOT NEED LAB APPOINTMENT; SHE ONLY NEEDED THE APPOINTMENT WITH DR HOPPER ON 7/14---PATIENT SAID THAT WAS OK Additional Follow-up by: Jerolyn Shin,  February 05, 2010 9:24 AM

## 2010-09-03 NOTE — Consult Note (Signed)
Summary: [facility]   Imported By: Lanelle Bal 04/29/2010 10:23:58  _____________________________________________________________________  External Attachment:    Type:   Image     Comment:   External Document

## 2010-09-19 ENCOUNTER — Inpatient Hospital Stay: Admit: 2010-09-19 | Payer: Self-pay | Admitting: Internal Medicine

## 2010-09-19 ENCOUNTER — Observation Stay (HOSPITAL_COMMUNITY)
Admission: AD | Admit: 2010-09-19 | Discharge: 2010-09-21 | DRG: 641 | Disposition: A | Discharge status: Home / Self Care | Payer: Medicare Other | Source: Other Acute Inpatient Hospital | Attending: Internal Medicine | Admitting: Internal Medicine

## 2010-09-19 ENCOUNTER — Emergency Department (HOSPITAL_BASED_OUTPATIENT_CLINIC_OR_DEPARTMENT_OTHER)
Admission: EM | Admit: 2010-09-19 | Discharge: 2010-09-19 | Disposition: A | Discharge status: Other Acute Inpatient Hospital | Payer: Medicare Other | Source: Home / Self Care | Attending: Emergency Medicine | Admitting: Emergency Medicine

## 2010-09-19 ENCOUNTER — Emergency Department (INDEPENDENT_AMBULATORY_CARE_PROVIDER_SITE_OTHER): Payer: Medicare Other

## 2010-09-19 DIAGNOSIS — E7800 Pure hypercholesterolemia, unspecified: Secondary | ICD-10-CM | POA: Diagnosis present

## 2010-09-19 DIAGNOSIS — M810 Age-related osteoporosis without current pathological fracture: Secondary | ICD-10-CM | POA: Diagnosis present

## 2010-09-19 DIAGNOSIS — Z79899 Other long term (current) drug therapy: Secondary | ICD-10-CM | POA: Insufficient documentation

## 2010-09-19 DIAGNOSIS — E538 Deficiency of other specified B group vitamins: Secondary | ICD-10-CM | POA: Diagnosis present

## 2010-09-19 DIAGNOSIS — Z882 Allergy status to sulfonamides status: Secondary | ICD-10-CM

## 2010-09-19 DIAGNOSIS — R42 Dizziness and giddiness: Secondary | ICD-10-CM | POA: Insufficient documentation

## 2010-09-19 DIAGNOSIS — R269 Unspecified abnormalities of gait and mobility: Secondary | ICD-10-CM | POA: Diagnosis present

## 2010-09-19 DIAGNOSIS — IMO0002 Reserved for concepts with insufficient information to code with codable children: Secondary | ICD-10-CM | POA: Insufficient documentation

## 2010-09-19 DIAGNOSIS — I059 Rheumatic mitral valve disease, unspecified: Secondary | ICD-10-CM | POA: Diagnosis present

## 2010-09-19 DIAGNOSIS — G589 Mononeuropathy, unspecified: Secondary | ICD-10-CM | POA: Insufficient documentation

## 2010-09-19 DIAGNOSIS — Z7982 Long term (current) use of aspirin: Secondary | ICD-10-CM

## 2010-09-19 DIAGNOSIS — M1990 Unspecified osteoarthritis, unspecified site: Secondary | ICD-10-CM | POA: Insufficient documentation

## 2010-09-19 DIAGNOSIS — K5730 Diverticulosis of large intestine without perforation or abscess without bleeding: Secondary | ICD-10-CM | POA: Diagnosis present

## 2010-09-19 DIAGNOSIS — Z96659 Presence of unspecified artificial knee joint: Secondary | ICD-10-CM

## 2010-09-19 DIAGNOSIS — E785 Hyperlipidemia, unspecified: Secondary | ICD-10-CM | POA: Diagnosis present

## 2010-09-19 LAB — PROTIME-INR
INR: 0.89 (ref 0.00–1.49)
Prothrombin Time: 12.3 seconds (ref 11.6–15.2)

## 2010-09-19 LAB — COMPREHENSIVE METABOLIC PANEL
Albumin: 4.3 g/dL (ref 3.5–5.2)
CO2: 25 mEq/L (ref 19–32)
GFR calc Af Amer: >60 mL/min (ref >60)
GFR calc non Af Amer: >60 mL/min (ref >60)
Sodium: 145 mEq/L (ref 135–145)
Total Bilirubin: 0.6 mg/dL (ref 0.3–1.2)
Total Protein: 7.6 g/dL (ref 6.0–8.3)

## 2010-09-19 LAB — CBC
HCT: 39.7 % (ref 36.0–46.0)
Hemoglobin: 13.6 g/dL (ref 12.0–15.0)
Platelets: 194 10*3/uL (ref 150–400)
WBC: 5.1 10*3/uL (ref 4.0–10.5)

## 2010-09-19 LAB — HEMOGLOBIN A1C
Hgb A1c MFr Bld: 5.3 % (ref <5.7)
Mean Plasma Glucose: 105 mg/dL (ref <117)

## 2010-09-19 LAB — POCT CARDIAC MARKERS: CKMB, poc: <1 ng/mL — ABNORMAL LOW (ref 1.0–8.0)

## 2010-09-20 ENCOUNTER — Inpatient Hospital Stay (HOSPITAL_COMMUNITY): Payer: Medicare Other

## 2010-09-20 DIAGNOSIS — I359 Nonrheumatic aortic valve disorder, unspecified: Secondary | ICD-10-CM

## 2010-09-20 LAB — CBC
Hemoglobin: 12.5 g/dL (ref 12.0–15.0)
MCHC: 33.3 g/dL (ref 30.0–36.0)
MCV: 89.3 fL (ref 78.0–100.0)
Platelets: 181 10*3/uL (ref 150–400)

## 2010-09-20 LAB — BASIC METABOLIC PANEL
CO2: 23 mEq/L (ref 19–32)
Calcium: 8.9 mg/dL (ref 8.4–10.5)
Creatinine, Ser: 0.66 mg/dL (ref 0.4–1.2)
GFR calc Af Amer: >60 mL/min (ref >60)

## 2010-09-20 LAB — URINALYSIS, ROUTINE W REFLEX MICROSCOPIC
Protein, ur: NEGATIVE mg/dL
Urine Glucose, Fasting: NEGATIVE mg/dL
Urobilinogen, UA: 0.2 mg/dL (ref 0.0–1.0)

## 2010-09-20 LAB — LIPID PANEL
LDL Cholesterol: 68 mg/dL (ref 0–99)
VLDL: 18 mg/dL (ref 0–40)

## 2010-09-23 NOTE — Discharge Summary (Signed)
Cassie White, Cassie White               ACCOUNT NO.:  [id number]  MEDICAL RECORD NO.:  [id number]           PATIENT TYPE:  I  LOCATION:  3008                         FACILITY:  [facility]  PHYSICIAN:  Cassie White, M.D. DATE OF BIRTH:  06-10-1941  DATE OF ADMISSION:  09/19/2010 DATE OF DISCHARGE:  09/21/2010                              DISCHARGE SUMMARY   PRIMARY CARE PHYSICIAN:  Cassie White. Cassie Ren, MD, FACP, FCCP  DISCHARGE DIAGNOSES: 1. Gait imbalance/dizziness, likely secondary to vitamin B12     deficiency. 2. Vitamin B12 deficiency. 3. Hyperlipidemia. 4. Mitral valve prolapse. 5. History of degenerative disk disease and osteoarthritis. 6. History of osteoporosis. 7. History of diverticulosis.  DISCHARGE MEDICATIONS: 1. Vitamin B12 1000 mcg injected intramuscularly daily for 5 days,     weekly for a month, and then monthly thereafter. 2. Neurontin 100 mg 2 tablets 3 times a day. 3. Pravastatin 20 mg on Monday, Wednesday, Friday. 4. Calcium 500 mg 1 tablet twice daily. 5. Vitamin D2 50,000 units weekly on Fridays. 6. Menest 0.625 mg 1 tablet daily. 7. Vitamin C 500 mg daily. 8. Glucosamine 2 tablets daily. 9. Aspirin 81 mg daily. 10.Fish oil 1 capsule daily. 11.Biotin 500 mg daily. 12.Tylenol Arthritis 2 tablets at bedtime for pain.  DISPOSITION AND FOLLOWUP:  Cassie White will be discharged home today in stable and improved condition.  She will need to perform a vitamin B12 injections as stated above.  I have recommended that she follow up with her PCP, Dr. Alwyn White in 3-4 weeks.  CONSULTATION THIS HOSPITALIZATION:  None.  IMAGES AND PROCEDURES: 1. A CT scan of the head on February 16 that showed no acute     abnormality with mild atrophy and minimal chronic small vessel     white matter ischemic changes. 2. MRI of the brain on February 17 that showed no acute findings with     no specific cause of the patient's described symptoms with minimal     small-vessel change  of the hemispheric white matter, less often     than seen in healthy individuals of this age. 3. The patient had a 2-D echocardiogram on February 17 that showed an     ejection fraction of 55-60% with grade 1 diastolic dysfunction with     mitral valve prolapse and moderate mitral valve regurgitation.  HISTORY AND PHYSICAL:  For full details, please see dictation on February 16 by Dr. Gwenlyn White, but in brief, Cassie White is an extremely pleasant 70 year old Caucasian lady who looks much younger than her stated age, who presented to the hospital with complaints of dizziness and some gait imbalance for 24 hours prior to her admission.  Because of this, we were asked to admit her for further evaluation and management.  HOSPITAL COURSE BY PROBLEM: 1. Gait imbalance and dizziness.  MRI and CT of the brain have been     negative for acute CVA.  Physical therapy did evaluate her for     vestibular training and they did not believe that the dizziness was     vestibular in nature.  She did not have nystagmus.  However,  she     does have vitamin B12 deficiency, we believe that this is the cause     of her symptoms. 2. Vitamin B12 deficiency.  She had a vitamin B12 level at 193.  This     is possibly the cause for her symptoms.  We have started her on     intramuscular replacement on a schedule as above, which she is     agreeable to performing at home. 3. We did notice a heart murmur on exam.  We proceeded to order an     echocardiogram which showed mitral valve prolapse with some     moderate mitral regurgitation.  This will need to be followed     clinically. 4. Hyperlipidemia.  She is on a statin and fish oil.  Her fasting     lipid panel was great and is as follows:  Total cholesterol of 172,     triglycerides of 88, HDL of 86, and LDL of 68.  We have continued     her hyperlipidemic medications as prior. 5. Rest of her issues have been stable.  VITALS ON DAY OF DISCHARGE:  Blood pressure  103/65, heart rate 82, respirations 18, sats of 94% on room air and a temperature 98.7.     Cassie White, M.D.     EH/MEDQ  D:  09/21/2010  T:  09/21/2010  Job:  [id number]  cc:   Cassie White. Cassie Ren, MD,FACP,FCCP  Electronically Signed by Cassie White M.D. on 09/23/2010 08:04:53 AM

## 2010-10-23 LAB — DIFFERENTIAL
Basophils Absolute: 0 10*3/uL (ref 0.0–0.1)
Basophils Relative: 1 % (ref 0–1)
Lymphocytes Relative: 28 % (ref 12–46)
Neutro Abs: 3.8 10*3/uL (ref 1.7–7.7)

## 2010-10-23 LAB — PROTIME-INR: Prothrombin Time: 12.7 seconds (ref 11.6–15.2)

## 2010-10-23 LAB — BASIC METABOLIC PANEL
BUN: 13 mg/dL (ref 6–23)
BUN: 2 mg/dL — ABNORMAL LOW (ref 6–23)
CO2: 27 mEq/L (ref 19–32)
Calcium: 8.3 mg/dL — ABNORMAL LOW (ref 8.4–10.5)
Calcium: 8.7 mg/dL (ref 8.4–10.5)
Calcium: 9.2 mg/dL (ref 8.4–10.5)
Chloride: 110 mEq/L (ref 96–112)
Creatinine, Ser: 0.5 mg/dL (ref 0.4–1.2)
Creatinine, Ser: 0.56 mg/dL (ref 0.4–1.2)
Creatinine, Ser: 0.7 mg/dL (ref 0.4–1.2)
GFR calc non Af Amer: >60 mL/min (ref >60)
GFR calc non Af Amer: >60 mL/min (ref >60)
Glucose, Bld: 116 mg/dL — ABNORMAL HIGH (ref 70–99)
Glucose, Bld: 93 mg/dL (ref 70–99)
Glucose, Bld: 97 mg/dL (ref 70–99)
Sodium: 138 mEq/L (ref 135–145)
Sodium: 141 mEq/L (ref 135–145)

## 2010-10-23 LAB — ABO/RH: ABO/RH(D): O POS

## 2010-10-23 LAB — URINALYSIS, ROUTINE W REFLEX MICROSCOPIC
Bilirubin Urine: NEGATIVE
Ketones, ur: NEGATIVE mg/dL
Nitrite: NEGATIVE
Protein, ur: NEGATIVE mg/dL
Specific Gravity, Urine: 1.014 (ref 1.005–1.030)
Urobilinogen, UA: 0.2 mg/dL (ref 0.0–1.0)

## 2010-10-23 LAB — CBC
Hemoglobin: 10.4 g/dL — ABNORMAL LOW (ref 12.0–15.0)
Hemoglobin: 10.6 g/dL — ABNORMAL LOW (ref 12.0–15.0)
MCHC: 34.7 g/dL (ref 30.0–36.0)
MCHC: 35 g/dL (ref 30.0–36.0)
MCV: 89.3 fL (ref 78.0–100.0)
Platelets: 143 10*3/uL — ABNORMAL LOW (ref 150–400)
Platelets: 203 10*3/uL (ref 150–400)
RDW: 13.5 % (ref 11.5–15.5)
RDW: 13.7 % (ref 11.5–15.5)
RDW: 13.9 % (ref 11.5–15.5)

## 2010-10-23 LAB — TYPE AND SCREEN: ABO/RH(D): O POS

## 2010-10-23 LAB — APTT: aPTT: 28 seconds (ref 24–37)

## 2010-11-06 LAB — DIFFERENTIAL
Basophils Absolute: 0 10*3/uL (ref 0.0–0.1)
Eosinophils Relative: 1 % (ref 0–5)
Lymphocytes Relative: 31 % (ref 12–46)
Lymphs Abs: 1.6 10*3/uL (ref 0.7–4.0)
Monocytes Absolute: 0.6 10*3/uL (ref 0.1–1.0)
Neutro Abs: 3 10*3/uL (ref 1.7–7.7)

## 2010-11-06 LAB — BASIC METABOLIC PANEL
Calcium: 9.5 mg/dL (ref 8.4–10.5)
GFR calc Af Amer: >60 mL/min (ref >60)
GFR calc non Af Amer: >60 mL/min (ref >60)
Potassium: 4.3 mEq/L (ref 3.5–5.1)
Sodium: 140 mEq/L (ref 135–145)

## 2010-11-06 LAB — CBC
HCT: 39.1 % (ref 36.0–46.0)
Hemoglobin: 13.5 g/dL (ref 12.0–15.0)
RBC: 4.38 MIL/uL (ref 3.87–5.11)
WBC: 5.3 10*3/uL (ref 4.0–10.5)

## 2010-11-20 ENCOUNTER — Ambulatory Visit (INDEPENDENT_AMBULATORY_CARE_PROVIDER_SITE_OTHER): Payer: Medicare Other | Admitting: Internal Medicine

## 2010-11-20 ENCOUNTER — Encounter: Payer: Self-pay | Admitting: Internal Medicine

## 2010-11-20 DIAGNOSIS — M1710 Unilateral primary osteoarthritis, unspecified knee: Secondary | ICD-10-CM

## 2010-11-20 DIAGNOSIS — M545 Low back pain, unspecified: Secondary | ICD-10-CM

## 2010-11-20 DIAGNOSIS — IMO0002 Reserved for concepts with insufficient information to code with codable children: Secondary | ICD-10-CM

## 2010-11-20 DIAGNOSIS — M810 Age-related osteoporosis without current pathological fracture: Secondary | ICD-10-CM

## 2010-11-20 DIAGNOSIS — M2550 Pain in unspecified joint: Secondary | ICD-10-CM

## 2010-11-20 LAB — SEDIMENTATION RATE: Sed Rate: 9 mm/hr (ref 0–22)

## 2010-11-20 LAB — URIC ACID: Uric Acid, Serum: 4.7 mg/dL (ref 2.4–7.0)

## 2010-11-20 MED ORDER — CYANOCOBALAMIN 1000 MCG/ML IJ SOLN: 1000.0000 ug | INTRAMUSCULAR | Status: DC

## 2010-11-20 MED ORDER — TRAMADOL HCL 50 MG PO TABS: 50.0000 mg | ORAL_TABLET | Freq: Four times a day (QID) | ORAL | Status: DC | PRN

## 2010-11-20 NOTE — Assessment & Plan Note (Signed)
S/P TKR

## 2010-11-20 NOTE — Progress Notes (Signed)
Addended by: Stephan Minister on: 11/20/2010 11:27 AM   Modules accepted: Orders

## 2010-11-20 NOTE — Progress Notes (Signed)
  Subjective:    Patient ID: Cassie White, female    DOB: 03-06-1941, 70 y.o.   MRN: [id number]  HPI she describes diffuse arthralgias "for years" w/o swelling or redness. This involves the arms below the shoulders as well as back, hips, knees, ankles, and feet. The right hand appears to be  Involved  most. Arthritis strength Tylenol has not helped. Ibuprofen on an as-needed basis has been more beneficial. Her knee pain is worse climbing stairs. The pain is bad enough that she is considering TKR on L  Her father did have arthritis. She has a history of osteoporosis.   Review of Systems Patient reports no  adenopathy,fever, weight change, significant heartburn or  bwoel changes,GU symptoms(dysuria, hematuria,pyuria ), focal weakness, numbness & tingling, skin/hair /nail changes,abnormal bruising or bleeding .    Objective:   Physical Exam she is in no acute distress; she appears younger than her stated age.  Skin is warm and dry. She has no jaundice.  Pupils are equally round; there is no scleral icterus.  The neck reveals no masses or thyromegaly. There is full range of motion of the neck.  She does have some mild crepitus with range of motion of the shoulders.  There are no significant hand or extremity changes. Specifically there is no clubbing, cyanosis or edema. With straight leg raising she is able to extend the legs greater than 90. Strength & DTRs are normal.Mild crepitus R > L knee w/o effusion  She has no lymphadenopathy about the neck or axilla.        Assessment & Plan:  #1 diffuse arthralgias with relative sparing of the neck and shoulder girdle  #2 osteoporosis  #3 past medical history of degenerative joint disease right knee, S/P TKR   Plan: #1 sedimentation rate, RA factor, uric acid, and vitamin D level.  #2 tramadol 50 mg every 6 hours as needed for pain.

## 2010-11-20 NOTE — Patient Instructions (Signed)
Please assess response to Tramadol  Every 6 hrs as needed

## 2010-11-21 LAB — RHEUMATOID FACTOR: Rhuematoid fact SerPl-aCnc: 11 IU/mL (ref <=14)

## 2010-12-17 NOTE — Op Note (Signed)
Cassie White, Cassie White               ACCOUNT NO.:  [id number]   MEDICAL RECORD NO.:  [id number]          PATIENT TYPE:  AMB   LOCATION:  [facility]                         FACILITY:  [facility]   PHYSICIAN:  Jamison Neighbor, M.D.  DATE OF BIRTH:  06-Oct-1940   DATE OF PROCEDURE:  02/18/2008  DATE OF DISCHARGE:                               OPERATIVE REPORT   PREOPERATIVE DIAGNOSIS:  Cystocele, with vault prolapse.   POSTOPERATIVE DIAGNOSIS:  Cystocele, with vault prolapse.   PROCEDURE:  Anterior repair with mesh, including vault prolapse repair.   SURGEON:  Jamison Neighbor, M.D.   ANESTHESIA:  General.   COMPLICATIONS:  None.   DRAINS:  Foley catheter.   HISTORY:  This 70 year old female has a cystocele that prolapses out to  and somewhat beyond the introitus.  She does not have any evidence of  stress incontinence.  The patient underwent urodynamic testing which  showed normal bladder function.  Even with a pack in place to elevate  the cystocele, she had no loss of urine.  The decision was made to  repair the cystocele but not necessary to place a sling.  The patient  can have a sling placed at a later date if necessary.  She gave full  informed consent for the procedure.  She realized there may be  postoperative stress incontinence, urgency, and incomplete emptying, or  eventual problems with either enterocele or rectocele there may require  additional surgery.  Full informed consent was obtained.   PROCEDURE:  After successful induction of general anesthesia, the  patient was placed in the dorsal lithotomy position, prepped with  Betadine, and draped in the usual sterile fashion.  The anterior vaginal  mucosa was infiltrated with local anesthetic.  An  incision was made  beginning at the proximal level of the bladder neck and extending all  way back to the vaginal cuff.  Flaps of mucosa were raised bilaterally,  trying to keep those as thick as possible.  The dissection proceeded  all  the way back to the endopelvic fascia.  Everything was mobilized, and  the spine was identified, including the ligament.  The tissue underneath  the bladder was very thinned out.  Normally, the mesh should be placed  without any plication, but it was felt that it would be safer in terms  of preventing erosion to plicate the bladder somewhat in the midline and  flatten things out.  The plication was performed with a series of  mattress sutures of 2-0 Vicryl, nicely reducing the cystocele.  The  Capio device was then placed into the ligament on both sides and then  also into the white line on both sides, so that the mesh could be  appropriately suspended.  The device was then tacked down.  The  elevation of the vaginal vault was very nice, and there was good support  for the entire bladder.  The mesh was trimmed appropriately.  The  decision was made not to place anything at the bladder neck or mid-  urethra, because the patient appeared to have good support.  A  cystoscopic examination showed a normal bladder, with no evidence of any  injury to the bladder.  With a full bladder, there was no loss of urine,  and it was felt that the patient did not require any for the urethra.  The area was carefully irrigated both before and after placement of the  mesh and was irrigated one more time.  Because there had been such a  redundant cystocele, a very small amount of mucosa was trimmed in order  to expedite the repair.  The  closure was then performed with a running  suture of 2-0 Vicryl.  The the patient underwent a rectal examination,  which showed that there had been no injury to the rectum, and there did  not appear to be any impingement of the rectum by the mesh.  The gloves  were changed.  The area was packed with an antibiotic cream and packing.  The patient tolerated the procedure well and was taken to the recovery  room in good condition.  She will have a Foley catheter in overnight  and  will have a voiding trial tomorrow morning.      Jamison Neighbor, M.D.  Electronically Signed     RJE/MEDQ  D:  02/18/2008  T:  02/18/2008  Job:  [id number]

## 2010-12-20 NOTE — Assessment & Plan Note (Signed)
[facility]                        [place] OFFICE NOTE   NAME:Cassie White, Cassie White                      MRN:          [id number]  DATE:06/30/2006                            DOB:          Oct 09, 1940    Cassie White was seen June 30, 2006, at age 70, for follow up of  her density.  She has been on Fosamax for 5 years and is on 1500 mg of  calcium and 400 international units of Vitamin D.  She has been on  hormonal replacement therapy for 14 years.  She has not been on  steroids.   She walks two times per week but is having severe pain in her feet.  There is no history of fractures.   Her bone density was reviewed.  There has been stability or slight  improvement in the femoral neck but significant decrease in the T-score  at the total hip.  She has gone from -1.2 to -1.8.  There is slight  deterioration in the spine as well at -3.2.  This is despite the  therapies listed above.   A Vitamin D level was performed which was normal at 40.  Vitamin D 1000  international units in addition to 1500 mg of calcium would be  recommended.  Pending are TSH, CBC and urine for Bence Jones protein.  If there is no evidence of secondary process then the bone density will  be repeated in 12 months.  The other consideration would be teriparatide  injections for 2 years in view of the significant osteoporosis and her  age of 62.   Her daughter was present and expressed concern about the pain in her  feet and paresthesias.  There are no skin changes noted.  The  musculoskeletal exam was unremarkable except for crepitus of her knees.   An ABI study was normal of September 24, 2005.  An RPR, sed rate, B12  level, TSH and RA have been normal or negative.  The RA was 21.3 with  normals less than 20.   The paresthesias are localized to the feet and do not suggest a spinal  condition such as spinal stenosis.A Neurology consultation will be  scheduled; I will  defer nerve conduction /EMG studies  vs.  MRI of the  lumbosacral spine to the specialist.   If that evaluation is negative then podiatric consultation could be  pursued.     Titus Dubin. Alwyn Ren, MD,FACP,FCCP  Electronically Signed    [facility]/MedQ  DD: 07/03/2006  DT: 07/03/2006  Job #: [id number]

## 2011-04-14 ENCOUNTER — Other Ambulatory Visit: Payer: Self-pay | Admitting: Obstetrics and Gynecology

## 2011-04-14 DIAGNOSIS — Z1231 Encounter for screening mammogram for malignant neoplasm of breast: Secondary | ICD-10-CM

## 2011-04-15 ENCOUNTER — Other Ambulatory Visit: Payer: Self-pay | Admitting: Internal Medicine

## 2011-05-22 ENCOUNTER — Ambulatory Visit (HOSPITAL_COMMUNITY)
Admission: RE | Admit: 2011-05-22 | Discharge: 2011-05-22 | Disposition: A | Discharge status: Home / Self Care | Payer: Medicare Other | Source: Ambulatory Visit | Attending: Obstetrics and Gynecology | Admitting: Obstetrics and Gynecology

## 2011-05-22 DIAGNOSIS — Z1231 Encounter for screening mammogram for malignant neoplasm of breast: Secondary | ICD-10-CM

## 2011-05-27 ENCOUNTER — Other Ambulatory Visit: Payer: Self-pay | Admitting: Obstetrics and Gynecology

## 2011-05-27 DIAGNOSIS — R928 Other abnormal and inconclusive findings on diagnostic imaging of breast: Secondary | ICD-10-CM

## 2011-06-10 ENCOUNTER — Ambulatory Visit
Admission: RE | Admit: 2011-06-10 | Discharge: 2011-06-10 | Disposition: A | Discharge status: Home / Self Care | Payer: Medicare Other | Source: Ambulatory Visit | Attending: Obstetrics and Gynecology | Admitting: Obstetrics and Gynecology

## 2011-06-10 DIAGNOSIS — R928 Other abnormal and inconclusive findings on diagnostic imaging of breast: Secondary | ICD-10-CM

## 2011-10-28 DIAGNOSIS — Z01419 Encounter for gynecological examination (general) (routine) without abnormal findings: Secondary | ICD-10-CM | POA: Diagnosis not present

## 2011-10-28 DIAGNOSIS — Z124 Encounter for screening for malignant neoplasm of cervix: Secondary | ICD-10-CM | POA: Diagnosis not present

## 2011-10-28 DIAGNOSIS — Z0000 Encounter for general adult medical examination without abnormal findings: Secondary | ICD-10-CM | POA: Diagnosis not present

## 2011-11-12 ENCOUNTER — Other Ambulatory Visit: Payer: Self-pay | Admitting: Internal Medicine

## 2011-11-12 NOTE — Telephone Encounter (Signed)
Lipid/Hep 272.4/995.20  

## 2011-11-28 ENCOUNTER — Other Ambulatory Visit: Payer: Self-pay | Admitting: Internal Medicine

## 2011-11-28 NOTE — Telephone Encounter (Signed)
B12 LEVEL, dx: 285.9

## 2011-12-18 ENCOUNTER — Other Ambulatory Visit: Payer: Self-pay | Admitting: Internal Medicine

## 2011-12-19 NOTE — Telephone Encounter (Signed)
Patient needs to schedule CPX and fasting labs  

## 2011-12-22 ENCOUNTER — Other Ambulatory Visit: Payer: Self-pay | Admitting: Internal Medicine

## 2011-12-23 NOTE — Telephone Encounter (Signed)
Lipid/Hep 272.4/995.20  

## 2012-02-02 ENCOUNTER — Other Ambulatory Visit: Payer: Self-pay | Admitting: Internal Medicine

## 2012-02-04 ENCOUNTER — Other Ambulatory Visit (INDEPENDENT_AMBULATORY_CARE_PROVIDER_SITE_OTHER): Payer: Medicare Other

## 2012-02-04 DIAGNOSIS — T887XXA Unspecified adverse effect of drug or medicament, initial encounter: Secondary | ICD-10-CM

## 2012-02-04 DIAGNOSIS — E785 Hyperlipidemia, unspecified: Secondary | ICD-10-CM

## 2012-02-04 LAB — HEPATIC FUNCTION PANEL
AST: 18 U/L (ref 0–37)
Albumin: 3.7 g/dL (ref 3.5–5.2)
Total Protein: 6.8 g/dL (ref 6.0–8.3)

## 2012-02-04 LAB — LIPID PANEL
Cholesterol: 231 mg/dL — ABNORMAL HIGH (ref 0–200)
Total CHOL/HDL Ratio: 2
Triglycerides: 85 mg/dL (ref 0.0–149.0)

## 2012-02-04 LAB — LDL CHOLESTEROL, DIRECT: Direct LDL: 96.7 mg/dL

## 2012-02-06 ENCOUNTER — Encounter: Payer: Self-pay | Admitting: *Deleted

## 2012-03-31 ENCOUNTER — Other Ambulatory Visit: Payer: Self-pay | Admitting: Internal Medicine

## 2012-05-10 ENCOUNTER — Other Ambulatory Visit: Payer: Self-pay | Admitting: Obstetrics and Gynecology

## 2012-05-10 DIAGNOSIS — Z1231 Encounter for screening mammogram for malignant neoplasm of breast: Secondary | ICD-10-CM

## 2012-05-24 DIAGNOSIS — Z23 Encounter for immunization: Secondary | ICD-10-CM | POA: Diagnosis not present

## 2012-06-10 ENCOUNTER — Ambulatory Visit (HOSPITAL_COMMUNITY)
Admission: RE | Admit: 2012-06-10 | Discharge: 2012-06-10 | Disposition: A | Discharge status: Home / Self Care | Payer: Medicare Other | Source: Ambulatory Visit | Attending: Obstetrics and Gynecology | Admitting: Obstetrics and Gynecology

## 2012-06-10 DIAGNOSIS — Z1231 Encounter for screening mammogram for malignant neoplasm of breast: Secondary | ICD-10-CM | POA: Diagnosis not present

## 2012-06-14 ENCOUNTER — Other Ambulatory Visit: Payer: Self-pay | Admitting: Obstetrics and Gynecology

## 2012-06-14 DIAGNOSIS — R928 Other abnormal and inconclusive findings on diagnostic imaging of breast: Secondary | ICD-10-CM

## 2012-06-22 ENCOUNTER — Ambulatory Visit
Admission: RE | Admit: 2012-06-22 | Discharge: 2012-06-22 | Disposition: A | Discharge status: Home / Self Care | Payer: Medicare Other | Source: Ambulatory Visit | Attending: Obstetrics and Gynecology | Admitting: Obstetrics and Gynecology

## 2012-06-22 DIAGNOSIS — R928 Other abnormal and inconclusive findings on diagnostic imaging of breast: Secondary | ICD-10-CM

## 2012-06-22 DIAGNOSIS — N6009 Solitary cyst of unspecified breast: Secondary | ICD-10-CM | POA: Diagnosis not present

## 2012-07-06 DIAGNOSIS — G609 Hereditary and idiopathic neuropathy, unspecified: Secondary | ICD-10-CM | POA: Diagnosis not present

## 2012-07-06 DIAGNOSIS — M5137 Other intervertebral disc degeneration, lumbosacral region: Secondary | ICD-10-CM | POA: Diagnosis not present

## 2012-07-06 DIAGNOSIS — M4802 Spinal stenosis, cervical region: Secondary | ICD-10-CM | POA: Diagnosis not present

## 2012-08-03 ENCOUNTER — Encounter: Payer: Self-pay | Admitting: Internal Medicine

## 2012-08-04 HISTORY — PX: OTHER SURGICAL HISTORY: SHX169

## 2012-09-06 ENCOUNTER — Encounter: Payer: Self-pay | Admitting: Lab

## 2012-09-07 ENCOUNTER — Encounter: Payer: Self-pay | Admitting: Internal Medicine

## 2012-09-07 ENCOUNTER — Ambulatory Visit (INDEPENDENT_AMBULATORY_CARE_PROVIDER_SITE_OTHER): Payer: Medicare Other | Admitting: Internal Medicine

## 2012-09-07 VITALS — BP 134/82 | HR 83 | Temp 98.0°F | Resp 12 | Ht 63.0 in | Wt 150.0 lb

## 2012-09-07 DIAGNOSIS — Z23 Encounter for immunization: Secondary | ICD-10-CM

## 2012-09-07 DIAGNOSIS — R079 Chest pain, unspecified: Secondary | ICD-10-CM

## 2012-09-07 DIAGNOSIS — M810 Age-related osteoporosis without current pathological fracture: Secondary | ICD-10-CM

## 2012-09-07 DIAGNOSIS — Z79899 Other long term (current) drug therapy: Secondary | ICD-10-CM | POA: Diagnosis not present

## 2012-09-07 DIAGNOSIS — E785 Hyperlipidemia, unspecified: Secondary | ICD-10-CM

## 2012-09-07 DIAGNOSIS — Z0000 Encounter for general adult medical examination without abnormal findings: Secondary | ICD-10-CM

## 2012-09-07 DIAGNOSIS — G609 Hereditary and idiopathic neuropathy, unspecified: Secondary | ICD-10-CM | POA: Diagnosis not present

## 2012-09-07 DIAGNOSIS — D510 Vitamin B12 deficiency anemia due to intrinsic factor deficiency: Secondary | ICD-10-CM | POA: Insufficient documentation

## 2012-09-07 LAB — HEPATIC FUNCTION PANEL
ALT: 19 U/L (ref 0–35)
AST: 19 U/L (ref 0–37)
Alkaline Phosphatase: 59 U/L (ref 39–117)
Bilirubin, Direct: 0 mg/dL (ref 0.0–0.3)
Total Bilirubin: 0.5 mg/dL (ref 0.3–1.2)
Total Protein: 7.1 g/dL (ref 6.0–8.3)

## 2012-09-07 LAB — BASIC METABOLIC PANEL
Calcium: 10.5 mg/dL (ref 8.4–10.5)
Chloride: 102 mEq/L (ref 96–112)
Creatinine, Ser: 0.7 mg/dL (ref 0.4–1.2)
GFR: 92.11 mL/min (ref >60.00)

## 2012-09-07 LAB — LIPID PANEL: Total CHOL/HDL Ratio: 2

## 2012-09-07 LAB — CBC WITH DIFFERENTIAL/PLATELET
Basophils Relative: 0.6 % (ref 0.0–3.0)
Eosinophils Relative: 0.8 % (ref 0.0–5.0)
Lymphocytes Relative: 29.2 % (ref 12.0–46.0)
MCV: 88.6 fl (ref 78.0–100.0)
Monocytes Relative: 13.2 % — ABNORMAL HIGH (ref 3.0–12.0)
Neutrophils Relative %: 56.2 % (ref 43.0–77.0)
RBC: 4.55 Mil/uL (ref 3.87–5.11)
WBC: 5.6 10*3/uL (ref 4.5–10.5)

## 2012-09-07 LAB — TSH: TSH: 0.61 u[IU]/mL (ref 0.35–5.50)

## 2012-09-07 LAB — VITAMIN B12: Vitamin B-12: 492 pg/mL (ref 211–911)

## 2012-09-07 NOTE — Progress Notes (Signed)
Subjective:    Patient ID: Cassie White, female    DOB: 11-02-1940, 72 y.o.   MRN: [id number]  HPI Medicare Wellness Visit:  Psychosocial & medical history were reviewed as required by Medicare (abuse,antisocial behavioral risks,firearm risk).  Social history: caffeine:1 cola/ day  , alcohol: no  ,  tobacco use:  no  Exercise :  No program No home & personal  safety / fall risk Activities of daily living: no limitations  Seatbelt  and smoke alarm employed. Power of Attorney/Living Will status : needed Ophthalmology exam current Hearing evaluation not current Orientation :oriented X 3  Memory & recall :good Spelling  testing:good Mood & affect : normal . Depression / anxiety: denied Travel history : last 2004 [place]  Immunization status :Shingles , PNA needed Transfusion history:  none  Preventive health surveillance ( colonoscopies, BMD , mammograms,PAP as per protocol/ SOC):  Colonoscopy& BMD ? due Dental care:  Every 6 mos. Chart reviewed &  Updated. Active issues reviewed & addressed.       Review of Systems She is on a heart healthy diet; as noted she is not on a regular exercise program but is physically active. She describes occasional exertional chest pain as SS tightness w/o nausea or sweating.She also has palpitations w/o dyspnea or claudication. Family history is negative for premature coronary disease. Advanced cholesterol testing reveals her LDL goal was less than 100.   She describes positional hand cramps holding a book. She also has numbness in her hands occasionally at night.     Objective:   Physical Exam Gen.: Healthy and well-nourished in appearance. Alert, appropriate and cooperative throughout exam. Appears younger than stated age  Head: Normocephalic without obvious abnormalities Eyes: No corneal or conjunctival inflammation noted. Pupils equal round reactive to light and accommodation. Fundal exam is benign without hemorrhages, exudate, papilledema.  Extraocular motion intact. Vision grossly normal. Ears: External  ear exam reveals no significant lesions or deformities. Some wax bilaterally. Hearing is grossly normal bilaterally. Nose: External nasal exam reveals no deformity or inflammation. Nasal mucosa are pink and moist. No lesions or exudates noted.   Mouth: Oral mucosa and oropharynx reveal no lesions or exudates. Teeth in good repair. Neck: No deformities, masses, or tenderness noted. Range of motion & Thyroid normal Lungs: Normal respiratory effort; chest expands symmetrically. Lungs are clear to auscultation without rales, wheezes, or increased work of breathing. Heart: Normal rate and rhythm. Normal S1 and S2. No gallop, click, or rub.Grade 1/2-1 over 6 systolic murmur  Abdomen: Bowel sounds normal; abdomen soft and nontender. No masses, organomegaly or hernias noted. Genitalia: Dr Tresa Res, Gyn                                    Musculoskeletal/extremities: Accentuated curvature of upper thoracic  spine. There is some asymmetry of the lower posterior thoracic musculature suggesting occult scoliosis . No clubbing, cyanosis, edema, or significant extremity  deformity noted. Range of motion normal .Tone & strength  normal.Joints normal . Nail health good. Able to lie down & sit up w/o help. Negative SLR bilaterally. Slight prominence of the palmar tendons without frank Dupuytren's Vascular: Carotid, radial artery, dorsalis pedis and  posterior tibial pulses are full and equal. No bruits present. Neurologic: Alert and oriented x3. Deep tendon reflexes symmetrical and normal. Skin: Intact without suspicious lesions or rashes.Tinel'snegative bilaterally. Lymph: No cervical, axillary lymphadenopathy present. Psych: Mood and affect are  normal. Normally interactive                                                                                        Assessment & Plan:  #1 Medicare Wellness Exam; criteria met ; data entered #2 exertional  substernal tightness; rule out anginal equivalent. Her neuromusculoskeletal issues preclude treadmill testing. EKG is read as low voltage in the precordial leads which would relate to body habitus and female gender. Intra-atrial conduction delay is suggested in Lead 2. 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

## 2012-09-07 NOTE — Addendum Note (Signed)
Addended by: Maurice Small on: 09/07/2012 10:19 AM   Modules accepted: Orders

## 2012-09-07 NOTE — Patient Instructions (Addendum)
Review and correct the record as indicated. Please share record with all medical staff seen.  

## 2012-09-10 ENCOUNTER — Encounter: Payer: Self-pay | Admitting: Obstetrics and Gynecology

## 2012-09-13 ENCOUNTER — Institutional Professional Consult (permissible substitution): Payer: Medicare Other | Admitting: Cardiology

## 2012-09-13 ENCOUNTER — Encounter: Payer: Self-pay | Admitting: Cardiology

## 2012-09-13 ENCOUNTER — Ambulatory Visit (INDEPENDENT_AMBULATORY_CARE_PROVIDER_SITE_OTHER): Payer: Medicare Other | Admitting: Cardiology

## 2012-09-13 VITALS — BP 128/70 | HR 96 | Ht 63.0 in | Wt 151.0 lb

## 2012-09-13 DIAGNOSIS — E785 Hyperlipidemia, unspecified: Secondary | ICD-10-CM

## 2012-09-13 DIAGNOSIS — R079 Chest pain, unspecified: Secondary | ICD-10-CM | POA: Diagnosis not present

## 2012-09-13 NOTE — Patient Instructions (Signed)
We will schedule you for a nuclear stress test.  Lexiscan Stress Electrocardiography Your caregiver has ordered a Lexiscan Stress Test with nuclear imaging. The purpose of this test is to evaluate the blood supply to your heart muscle. This procedure is referred to as a "Non-Invasive Stress Test." This is because other than having an IV started in your vein, nothing is inserted or "invades" your body. Cardiac stress tests are done to find areas of poor blood flow to the heart by determining the extent of coronary artery disease (CAD). Some patients exercise on a treadmill, which naturally increases the blood flow to your heart. However, almost half of the patients undergoing a treadmill stress test each year are unable to exercise adequately. This is due to various medical conditions. For these patients, a pharmacologic/chemical stress agent called Eugenie Birks is used on patients without a history of asthma. This medicine will mimic walking on a treadmill by temporarily increasing your coronary blood flow. The side effects of this medicine include:  Headache.  Dizziness.  Shortness of breath.  Flushing.  Chest pain/pressure.  Feeling sick to your stomach (nauseous).  Increased heart rate.  Abdominal discomfort.  Low blood pressure. BEFORE THE PROCEDURE   Avoid all forms of caffeine 24 hours before your test. This includes coffee, tea (even decaffeinated brands), caffeinated sodas, chocolate, cocoa and certain pain medications that contain caffeine.  Do not eat anything 6 hours before the test. In hospitalized patients, this means nothing by mouth after midnight.  Patients with diabetes that are not hospitalized (outpatients) should talk to their caregiver about how much, if any, insulin or oral diabetic medications they should take the day of the test. You should also talk about the type of light snack that you should eat the morning of the test.  Patients with diabetes that are  hospitalized (inpatients) will follow the cardiologist's written instructions that will be given to you.  Outpatients should bring a snack. Use the hospital vending machines so that you may eat right after the stress phase.  Wear comfortable shoes. There is at least an hour wait before the stress phase of nuclear scanning is done. The total procedure time is approximately 3 hours.  The following medications should be stopped 24 hours before your stress test: Beta-blockers and nitroglycerine (patches or paste should be removed 4 hours before the test).  Women, tell your caregiver if there is any possibility that you may be pregnant or if you are currently breastfeeding. PROCEDURE  There are two separate nuclear images taken using a nuclear camera. These images require a small dose of a radiotracer called an isotope. Most diagnostic nuclear medicine procedures result in low radiation exposure. Allergic reactions to the isotope may include slight redness going up the arm and pain during the injection. However, these reactions are extremely rare and usually mild. Eugenie Birks is given very rapidly over 10-15 seconds in the vein (intravenously) followed by a nuclear isotope. The medication causes the arteries in your heart to widen (dilate), and the nuclear isotope lights up those arteries so that the nuclear images are clear. Together, this shows whether the coronary blood flow is normal or abnormal. During this stress phase, you will be connected to an EKG machine. Your blood pressure and oxygen levels will be monitored by the cardiologist and cardiac nurse. This part usually lasts 5-10 minutes. For inpatients, the procedure is done in the patient's room. For outpatients, the procedure is done in the stress lab.  The "Resting Phase" is  done before the Lexiscan injection and shows how your heart functions at rest. The "Stress Phase" is done about 1 hour after the Lexiscan injection and determines how your heart  functions under stress.  LEXISCAN STRESS TEST IS USEFUL FOR DETERMINING:  The adequacy of blood flow to your heart during increased levels of activity in order to clear you for discharge home.  The extent of coronary artery blockage caused by CAD.  Your prognosis if you have suffered a heart attack.  The effectiveness of cardiac procedures done, such as an angioplasty, which can increase the circulation in your coronary arteries.  Cause(s) of chest pain/pressure. Finding out the results of your test  Ask when your test results will be ready. Make sure you get your test results. Document Released: 12/07/2008 Document Revised: 10/13/2011 Document Reviewed: 12/07/2008 [facility] Patient Information 2013 [place], [place].

## 2012-09-13 NOTE — Progress Notes (Signed)
Pollyann Samples Date of Birth: 1940-10-04 Medical Record #[id number]  History of Present Illness: [name] is seen at the request of Dr. Alwyn Ren for evaluation of chest tightness. She is a pleasant 72 year old white female who has a history of hyperlipidemia. She also has a history of mitral valve prolapse. She reports that over the past 6 months she has experienced symptoms of moderate tightness in her chest when she overexerts or gets tired. This is centrally located. There is no radiation. She has some associated shortness of breath. Usually she just stops and rests and her tightness will resolve within 30-60 minutes. She did have a Myoview study in 2010 which showed mild breast attenuation but was otherwise normal. She had an echocardiogram in 2011 which demonstrated moderate mitral valve prolapse with moderate mitral insufficiency. LV function was normal.  Current Outpatient Prescriptions on File Prior to Visit  Medication Sig Dispense Refill  . Ascorbic Acid (VITAMIN C) 500 MG tablet Take 500 mg by mouth daily.        Marland Kitchen aspirin 81 MG tablet Take 81 mg by mouth daily.        . Calcium Carbonate-Vitamin D (CALCIUM + D PO) Take by mouth. 1700 mg daily       . cyanocobalamin (,VITAMIN B-12,) 1000 MCG/ML injection INJECT INTO THE MUSCLE EVERY 30 DAYS  10 mL  5  . ergocalciferol (VITAMIN D2) 50000 UNITS capsule Take 50,000 Units by mouth once a week.        . esterified estrogens (MENEST) 0.625 MG tablet Take 0.625 mg by mouth daily.        Marland Kitchen gabapentin (NEURONTIN) 100 MG tablet Take 100 mg by mouth as directed. 1 @ Lunch, 1 @ dinner, 2 @ bedtime      . Glucosamine Sulfate 500 MG CAPS Take by mouth.        . methocarbamol (ROBAXIN) 500 MG tablet Take 500 mg by mouth as needed.        . pravastatin (PRAVACHOL) 20 MG tablet TAKE 1 TABLET BY MOUTH MON,WED,FRI  90 tablet  3   No current facility-administered medications on file prior to visit.    Allergies  Allergen Reactions  .  Sulfonamide Derivatives     Facial swelling    Past Medical History  Diagnosis Date  . Arthritis     DJD  . Hyperlipidemia   . GERD (gastroesophageal reflux disease)   . Osteoporosis, senile   . Diverticulosis   . Scoliosis   . Peripheral neuropathy     Past Surgical History  Procedure Laterality Date  . Tonsillectomy    . Vaginal hysterectomy      For Uterine Deviation   . Total knee arthroplasty  09/2009    Right ;Dr Charlann Boxer  . Cholecystectomy  03/2010    Dr.Gross  . Lumpectomy  06/2009    "Fatty Necrosis"  . Breast surgery  Nov 2010    Benign biopsy, right  . Cystocele repair    . Combined hysterectomy vaginal / oophorectomy / a&p repair  1990    uncertain if ovaries were removed or not    History  Smoking status  . Never Smoker   Smokeless tobacco  . Not on file    History  Alcohol Use No    Family History  Problem Relation Age of Onset  . Heart attack Father     in 52s  . Stroke Father 10  . Ovarian cancer Mother   . Coronary  artery disease Brother   . Heart attack Son 74    Smoker  . Diabetes Neg Hx     Review of Systems: The review of systems is positive for peripheral neuropathy. This limits her ability to to walk.  All other systems were reviewed and are negative.  Physical Exam: BP 128/70  Pulse 96  Ht 5\' 3"  (1.6 m)  Wt 151 lb (68.493 kg)  BMI 26.76 kg/m2  SpO2 93% She is a pleasant white female in no acute distress. The patient is alert and oriented x 3.  The mood and affect are normal.  The skin is warm and dry.  Color is normal.  The HEENT exam reveals that the sclera are nonicteric.  The mucous membranes are moist.  The carotids are 2+ without bruits.  There is no thyromegaly.  There is no JVD.  The lungs are clear.  The chest wall is non tender.  The heart exam reveals a regular rate with a normal S1 and S2.  There is a soft grade 1-2/6 systolic murmur heard best at the apex..  The PMI is not displaced.   Abdominal exam reveals good  bowel sounds.  There is no guarding or rebound.  There is no hepatosplenomegaly or tenderness.  There are no masses.  Exam of the legs reveal no clubbing, cyanosis, or edema.  The legs are without rashes.  The distal pulses are intact.  Cranial nerves II - XII are intact.  Motor and sensory functions are intact.  The gait is normal.  LABORATORY DATA: ECG demonstrates normal sinus rhythm with low voltage. It is otherwise normal.  Assessment / Plan: 1. Exertional chest tightness. The to rule out coronary disease. Her last evaluation 2002 and was unremarkable but her symptoms have been more recent in onset. We will schedule her for a Lexiscan Myoview study. I think it is unlikely m valve prolapse is causing the symptoms. On a previous echocardiogram showed moderate insufficiency her exam is fairly unimpressive.  2. Mitral valve prolapse.  3. Hyperlipidemia.

## 2012-09-20 ENCOUNTER — Ambulatory Visit (INDEPENDENT_AMBULATORY_CARE_PROVIDER_SITE_OTHER)
Admission: RE | Admit: 2012-09-20 | Discharge: 2012-09-20 | Disposition: A | Discharge status: Home / Self Care | Payer: Medicare Other | Source: Ambulatory Visit | Attending: Internal Medicine | Admitting: Internal Medicine

## 2012-09-20 DIAGNOSIS — M810 Age-related osteoporosis without current pathological fracture: Secondary | ICD-10-CM | POA: Diagnosis not present

## 2012-09-21 ENCOUNTER — Ambulatory Visit (HOSPITAL_COMMUNITY): Payer: Medicare Other | Attending: Cardiovascular Disease | Admitting: Radiology

## 2012-09-21 VITALS — BP 129/70 | Ht 63.0 in | Wt 148.0 lb

## 2012-09-21 DIAGNOSIS — R0609 Other forms of dyspnea: Secondary | ICD-10-CM | POA: Insufficient documentation

## 2012-09-21 DIAGNOSIS — R0789 Other chest pain: Secondary | ICD-10-CM | POA: Diagnosis not present

## 2012-09-21 DIAGNOSIS — Z8249 Family history of ischemic heart disease and other diseases of the circulatory system: Secondary | ICD-10-CM | POA: Diagnosis not present

## 2012-09-21 DIAGNOSIS — R0602 Shortness of breath: Secondary | ICD-10-CM | POA: Insufficient documentation

## 2012-09-21 DIAGNOSIS — R42 Dizziness and giddiness: Secondary | ICD-10-CM | POA: Diagnosis not present

## 2012-09-21 DIAGNOSIS — R0989 Other specified symptoms and signs involving the circulatory and respiratory systems: Secondary | ICD-10-CM | POA: Diagnosis not present

## 2012-09-21 DIAGNOSIS — R002 Palpitations: Secondary | ICD-10-CM | POA: Insufficient documentation

## 2012-09-21 DIAGNOSIS — R079 Chest pain, unspecified: Secondary | ICD-10-CM

## 2012-09-21 MED ORDER — TECHNETIUM TC 99M SESTAMIBI GENERIC - CARDIOLITE
11.0000 | Freq: Once | INTRAVENOUS | Status: AC | PRN
  Administered 2012-09-21: 11 via INTRAVENOUS

## 2012-09-21 MED ORDER — REGADENOSON 0.4 MG/5ML IV SOLN
0.4000 mg | Freq: Once | INTRAVENOUS | Status: AC
  Administered 2012-09-21: 0.4 mg via INTRAVENOUS

## 2012-09-21 MED ORDER — TECHNETIUM TC 99M SESTAMIBI GENERIC - CARDIOLITE
33.0000 | Freq: Once | INTRAVENOUS | Status: AC | PRN
  Administered 2012-09-21: 33 via INTRAVENOUS

## 2012-09-21 NOTE — Progress Notes (Signed)
[facility] 3 NUCLEAR MED [address] [place], [place] [address] [phone]    Cardiology Nuclear Med Study  Cassie White is a 72 y.o. female     MRN : [id number]     DOB: 07-02-1941  Procedure Date: 09/21/2012  Nuclear Med Background Indication for Stress Test:  Evaluation for Ischemia History:  MVP and 2011 ECHO: mod mitral mild MVP, MPS: EF: 69% fixed apical perfussion defect with (-) ischemia ? Breast Attenuation,  Cardiac Risk Factors: Family History - CAD and Lipids  Symptoms:  Chest Tightness, Dizziness, DOE and Palpitations   Nuclear Pre-Procedure Caffeine/Decaff Intake:  None NPO After: 7:00pm   Lungs:  clear O2 Sat: 95% on room air. IV 0.9% NS with Angio Cath:  20g  IV Site: R Wrist  IV Started by:  Cathlyn Parsons, RN  Chest Size (in):  38 Cup Size: D  Height: 5\' 3"  (1.6 m)  Weight:  148 lb (67.132 kg)  BMI:  Body mass index is 26.22 kg/(m^2). Tech Comments:  This patient was a walking Lexiscan. She was stumbling and unsteady, if she has this test again she should be a sitting Lexiscan.    Nuclear Med Study 1 or 2 day study: 1 day  Stress Test Type:  Treadmill/Lexiscan  Reading MD: Kristeen Miss, MD  Order Authorizing Provider:  Vonna Drafts  Resting Radionuclide: Technetium 27m Sestamibi  Resting Radionuclide Dose: 11.0 mCi   Stress Radionuclide:  Technetium 75m Sestamibi  Stress Radionuclide Dose: 33.0 mCi           Stress Protocol Rest HR: 63 Stress HR: 117  Rest BP: 129/70 Stress BP: 148/70  Exercise Time (min): n/a METS: n/a   Predicted Max HR: 149 bpm % Max HR: 78.52 bpm Rate Pressure Product: 78469   Dose of Adenosine (mg):  n/a Dose of Lexiscan: 0.4 mg  Dose of Atropine (mg): n/a Dose of Dobutamine: n/a mcg/kg/min (at max HR)  Stress Test Technologist: Milana Na, EMT-P  Nuclear Technologist:  Domenic Polite, CNMT     Rest Procedure:  Myocardial perfusion imaging was performed at rest 45 minutes following the  intravenous administration of Technetium 4m Sestamibi. Rest ECG: NSR - Normal EKG  Stress Procedure:  The patient received IV Lexiscan 0.4 mg over 15-seconds.  Technetium 19m Sestamibi injected at 30-seconds. This patient was Sob with Lexiscan with rare pvcs. Quantitative spect images were obtained after a 45 minute delay. Stress ECG: No significant change from baseline ECG  QPS Raw Data Images:  There is a breast shadow that accounts for the anterior attenuation. Stress Images:  There is a mild, medium sized defect in the distal anterior wall and apex with normal uptake in the other areas.  Rest Images:  There is a mild, medium sized defect in the distal anterior wall and apex with normal uptake in the other areas Subtraction (SDS):  No evidence of ischemia. Transient Ischemic Dilatation (Normal <1.22):  1.00 Lung/Heart Ratio (Normal <0.45):  0.24  Quantitative Gated Spect Images QGS EDV:  76 ml QGS ESV:  24 ml  Impression Exercise Capacity:  Lexiscan with no exercise. BP Response:  Normal blood pressure response. Clinical Symptoms:  No significant symptoms noted. ECG Impression:  No significant ST segment change suggestive of ischemia. Comparison with Prior Nuclear Study: No significant change from previous study 09/13/09  Overall Impression:  Low risk stress nuclear study.  There is a small fixed anterior defect that is likely to be breast attenuation.  This is similar to her previous study.  There is no ischemia.   LV Ejection Fraction: 69%.  LV Wall Motion:  NL LV Function; NL Wall Motion.   Vesta Mixer, Montez Hageman., MD, [facility] 09/21/2012, 6:05 PM Office - [phone] Pager [phone]

## 2012-09-26 ENCOUNTER — Encounter: Payer: Self-pay | Admitting: Internal Medicine

## 2012-09-29 ENCOUNTER — Other Ambulatory Visit: Payer: Self-pay | Admitting: *Deleted

## 2012-09-29 DIAGNOSIS — I341 Nonrheumatic mitral (valve) prolapse: Secondary | ICD-10-CM

## 2012-09-30 ENCOUNTER — Ambulatory Visit (HOSPITAL_COMMUNITY): Payer: Medicare Other | Attending: Cardiology

## 2012-09-30 DIAGNOSIS — I059 Rheumatic mitral valve disease, unspecified: Secondary | ICD-10-CM | POA: Diagnosis not present

## 2012-09-30 DIAGNOSIS — I341 Nonrheumatic mitral (valve) prolapse: Secondary | ICD-10-CM

## 2012-09-30 NOTE — Progress Notes (Signed)
Echocardiogram performed.  

## 2012-10-14 ENCOUNTER — Encounter: Payer: Self-pay | Admitting: Internal Medicine

## 2012-10-28 ENCOUNTER — Ambulatory Visit: Payer: Medicare Other | Admitting: Cardiology

## 2012-11-09 ENCOUNTER — Telehealth: Payer: Self-pay | Admitting: Internal Medicine

## 2012-11-09 NOTE — Telephone Encounter (Signed)
Patient instructed to contact insurance company to see if they cover. If so patient to let us know and we will order prolia and schedule patient for nurse visit

## 2012-11-09 NOTE — Telephone Encounter (Signed)
Patient is requesting Prolia. Wants to know if Dr. Alwyn Ren thinks she should take it.

## 2012-11-11 ENCOUNTER — Telehealth: Payer: Self-pay | Admitting: Internal Medicine

## 2012-11-11 NOTE — Telephone Encounter (Signed)
Patient called Medicare & they told her that the doctor's office has to call. They would not give her information about the injection especially since she didn't have the procedure code.

## 2012-11-16 ENCOUNTER — Ambulatory Visit: Payer: Self-pay | Admitting: Obstetrics and Gynecology

## 2012-11-16 ENCOUNTER — Encounter: Payer: Self-pay | Admitting: Obstetrics and Gynecology

## 2012-11-17 NOTE — Telephone Encounter (Signed)
All information was faxed to prolia verification, still awaiting to hear back

## 2012-12-08 ENCOUNTER — Telehealth: Payer: Self-pay | Admitting: Obstetrics and Gynecology

## 2012-12-08 NOTE — Telephone Encounter (Signed)
Dr. Tresa Res, Patient called requesting $50 fee for missed appointment 11/16/12 with be waived. The appointment was scheduled as a procedure in a procedure slot by Dr. Tresa Res with no appointment notes. Thus, the appointment was converted into EPIC as a procedure. The patient was charged $50 but, looking at patient's history in [place], I think this should have been an AEX rescheduled from 10/29/12 but the slot type was not changed. Patient got upset and hung up on me when I said I needed to look into it further. Please confirm my thoughts about the research and let me know if my conclusions are correct. If so, I will contact the patient and waive the fee. Thank you, Mervin Kung

## 2012-12-08 NOTE — Telephone Encounter (Signed)
Yes, this should have been an AnEx and the appt slot type just wasn't changed.  So it's fine to just waive the fee.  Thanks.

## 2012-12-09 NOTE — Telephone Encounter (Signed)
Patient called back and scheduled appointment for next Wednesday 12/15/12

## 2012-12-09 NOTE — Telephone Encounter (Signed)
Prolia verification received and sent for scanning, left message on voicemail for patient to call and scheduled appointment for Prolia ( I will need to order prolia, if patient ok to receive prolia), cost 171.40 (28.57 equivalent monthly cost)

## 2012-12-15 ENCOUNTER — Ambulatory Visit (INDEPENDENT_AMBULATORY_CARE_PROVIDER_SITE_OTHER): Payer: Medicare Other | Admitting: *Deleted

## 2012-12-15 DIAGNOSIS — M810 Age-related osteoporosis without current pathological fracture: Secondary | ICD-10-CM

## 2012-12-15 MED ORDER — DENOSUMAB 60 MG/ML ~~LOC~~ SOLN
60.0000 mg | Freq: Once | SUBCUTANEOUS | Status: AC
  Administered 2012-12-15: 60 mg via SUBCUTANEOUS

## 2012-12-23 ENCOUNTER — Telehealth: Payer: Self-pay | Admitting: Internal Medicine

## 2012-12-23 NOTE — Telephone Encounter (Signed)
Patient called requesting Rx for Shingles vaccine. Patient has verified coverage with Medicare. Want Rx sent to [facility] in [place] (ph# [phone]).

## 2012-12-24 MED ORDER — ZOSTER VACCINE LIVE 19400 UNT/0.65ML ~~LOC~~ SOLR: 0.6500 mL | Freq: Once | SUBCUTANEOUS | Status: DC

## 2012-12-24 NOTE — Telephone Encounter (Signed)
Rx sent 

## 2013-02-24 DIAGNOSIS — M2040 Other hammer toe(s) (acquired), unspecified foot: Secondary | ICD-10-CM | POA: Diagnosis not present

## 2013-02-24 DIAGNOSIS — R262 Difficulty in walking, not elsewhere classified: Secondary | ICD-10-CM | POA: Diagnosis not present

## 2013-02-24 DIAGNOSIS — M79609 Pain in unspecified limb: Secondary | ICD-10-CM | POA: Diagnosis not present

## 2013-05-12 ENCOUNTER — Ambulatory Visit (INDEPENDENT_AMBULATORY_CARE_PROVIDER_SITE_OTHER): Payer: Medicare Other

## 2013-05-12 DIAGNOSIS — Z23 Encounter for immunization: Secondary | ICD-10-CM | POA: Diagnosis not present

## 2013-05-15 ENCOUNTER — Other Ambulatory Visit: Payer: Self-pay | Admitting: Internal Medicine

## 2013-05-16 ENCOUNTER — Other Ambulatory Visit: Payer: Self-pay | Admitting: *Deleted

## 2013-05-16 MED ORDER — PRAVASTATIN SODIUM 20 MG PO TABS: ORAL_TABLET | ORAL | Status: DC

## 2013-05-16 NOTE — Telephone Encounter (Signed)
Pravastatin refill sent to pharmacy

## 2013-05-25 ENCOUNTER — Other Ambulatory Visit: Payer: Self-pay | Admitting: Internal Medicine

## 2013-05-25 DIAGNOSIS — Z1231 Encounter for screening mammogram for malignant neoplasm of breast: Secondary | ICD-10-CM

## 2013-06-03 ENCOUNTER — Ambulatory Visit: Payer: Medicare Other | Admitting: Cardiology

## 2013-06-13 DIAGNOSIS — I499 Cardiac arrhythmia, unspecified: Secondary | ICD-10-CM | POA: Diagnosis not present

## 2013-06-13 DIAGNOSIS — Z0000 Encounter for general adult medical examination without abnormal findings: Secondary | ICD-10-CM | POA: Diagnosis not present

## 2013-06-13 DIAGNOSIS — Z01419 Encounter for gynecological examination (general) (routine) without abnormal findings: Secondary | ICD-10-CM | POA: Diagnosis not present

## 2013-06-14 ENCOUNTER — Ambulatory Visit (INDEPENDENT_AMBULATORY_CARE_PROVIDER_SITE_OTHER)
Admission: RE | Admit: 2013-06-14 | Discharge: 2013-06-14 | Disposition: A | Discharge status: Home / Self Care | Payer: Medicare Other | Source: Ambulatory Visit | Attending: Internal Medicine | Admitting: Internal Medicine

## 2013-06-14 ENCOUNTER — Ambulatory Visit (INDEPENDENT_AMBULATORY_CARE_PROVIDER_SITE_OTHER): Payer: Medicare Other | Admitting: Internal Medicine

## 2013-06-14 ENCOUNTER — Encounter: Payer: Self-pay | Admitting: Internal Medicine

## 2013-06-14 VITALS — BP 131/72 | HR 89 | Temp 97.7°F | Wt 151.4 lb

## 2013-06-14 DIAGNOSIS — R0609 Other forms of dyspnea: Secondary | ICD-10-CM | POA: Diagnosis not present

## 2013-06-14 DIAGNOSIS — R079 Chest pain, unspecified: Secondary | ICD-10-CM | POA: Diagnosis not present

## 2013-06-14 DIAGNOSIS — R002 Palpitations: Secondary | ICD-10-CM | POA: Diagnosis not present

## 2013-06-14 DIAGNOSIS — R0600 Dyspnea, unspecified: Secondary | ICD-10-CM

## 2013-06-14 DIAGNOSIS — I341 Nonrheumatic mitral (valve) prolapse: Secondary | ICD-10-CM

## 2013-06-14 DIAGNOSIS — I059 Rheumatic mitral valve disease, unspecified: Secondary | ICD-10-CM

## 2013-06-14 MED ORDER — TRAMADOL HCL 50 MG PO TABS: 50.0000 mg | ORAL_TABLET | Freq: Three times a day (TID) | ORAL | Status: DC | PRN

## 2013-06-14 NOTE — Progress Notes (Signed)
Pre visit review using our clinic review tool, if applicable. No additional management support is needed unless otherwise documented below in the visit note. 

## 2013-06-14 NOTE — Progress Notes (Signed)
Subjective:    Patient ID: Cassie White, female    DOB: 1941-01-04, 72 y.o.   MRN: [id number]  HPI Dohle chest tightness associated with dyspnea after exercising such as yardwork or housework. This is also associated with palpitations. Symptoms can last up to 20 minutes. There is no associated nausea or sweating with the symptoms. Symptoms do improve when she takes an aspirin and rests.  She has had mitral valve prolapse. She was seen by Dr. [name] to evaluate similar symptoms in February; myocardial perfusion study was essentially normal  except for some breast  attenuation. This was similar to a prior study.  She has no history of asthma has never smoked. There is a strong family history coronary artery disease    Review of Systems She denies dyspepsia, dysphagia, abdominal pain, unexplained weight loss, melena, rectal bleeding.  She does not have any hemoptysis or but does have some rattly cough w/o sputum production. There is no associated back pain or  radicular character to the pain.  She also denies pedal edema.  She has no paroxysmal nocturnal dyspnea. She also denies claudication.  There's no history of syncope or significant associated dizziness.       Objective:   Physical Exam Gen.: Healthy and well-nourished in appearance. Alert, appropriate and cooperative throughout exam.Appears younger than stated age  Head: Normocephalic without obvious abnormalities  Eyes: No corneal or conjunctival inflammation noted. Pupils equal round reactive to light and accommodation. No icterus Ears: External  ear exam reveals no significant lesions or deformities. Canals clear .TMs normal. Hearing is grossly normal bilaterally. Nose: External nasal exam reveals no deformity or inflammation. Nasal mucosa are pink and moist. No lesions or exudates noted.   Mouth: Oral mucosa and oropharynx reveal no lesions or exudates. Teeth in good repair. Neck: No deformities, masses, or tenderness noted.  Range of motion slightly decreased. Thyroid normal Lungs: Normal respiratory effort; chest expands symmetrically. Lungs are clear to auscultation without rales, wheezes, or increased work of breathing. Heart: Normal rate and rhythm. Normal S1 and S2. No gallop, click, or rub. S4 w/o murmur. Abdomen: Bowel sounds normal; abdomen soft and nontender. No masses, organomegaly or hernias noted. Genitalia:  as per Gyn                                  Musculoskeletal/extremities:  There is some asymmetry of the posterior thoracic musculature with scoliosis. No clubbing, cyanosis, edema, or significant extremity  deformity noted. Range of motion normal .Tone & strength normal. Hand joints normal . Fingernail  health good. Able to lie down & sit up w/o help. Negative SLR bilaterally Vascular: Carotid, radial artery, dorsalis pedis and  posterior tibial pulses are full and equal. No bruits present. Neurologic: Alert and oriented x3. Deep tendon reflexes symmetrical and normal.       Skin: Intact without suspicious lesions or rashes. Lymph: No cervical, axillary, or inguinal lymphadenopathy present. Psych: Mood and affect are normal. Normally interactive  Assessment & Plan:  #1 exertional chest discomfort, dyspnea, and palpitations  Plan: Chest x-ray and pulmonary function test will be performed. If these are negative; I will ask Dr. [name] to reevaluate her. Pulmonary stress testing could also be considered

## 2013-06-14 NOTE — Patient Instructions (Signed)
I recommend a follow up Cardiology consultation if pulmonary evaluation is negative.

## 2013-06-15 ENCOUNTER — Encounter: Payer: Self-pay | Admitting: *Deleted

## 2013-06-15 NOTE — Progress Notes (Signed)
Letter mailed to patient.

## 2013-06-22 ENCOUNTER — Ambulatory Visit: Payer: Medicare Other | Admitting: Physician Assistant

## 2013-06-23 ENCOUNTER — Ambulatory Visit (HOSPITAL_COMMUNITY)
Admission: RE | Admit: 2013-06-23 | Discharge: 2013-06-23 | Disposition: A | Discharge status: Home / Self Care | Payer: Medicare Other | Source: Ambulatory Visit | Attending: Internal Medicine | Admitting: Internal Medicine

## 2013-06-23 DIAGNOSIS — Z1231 Encounter for screening mammogram for malignant neoplasm of breast: Secondary | ICD-10-CM

## 2013-07-04 ENCOUNTER — Ambulatory Visit (INDEPENDENT_AMBULATORY_CARE_PROVIDER_SITE_OTHER): Payer: Medicare Other | Admitting: Physician Assistant

## 2013-07-04 ENCOUNTER — Encounter: Payer: Self-pay | Admitting: Physician Assistant

## 2013-07-04 VITALS — BP 150/80 | HR 90 | Ht 63.0 in | Wt 151.4 lb

## 2013-07-04 DIAGNOSIS — E785 Hyperlipidemia, unspecified: Secondary | ICD-10-CM | POA: Diagnosis not present

## 2013-07-04 DIAGNOSIS — I059 Rheumatic mitral valve disease, unspecified: Secondary | ICD-10-CM

## 2013-07-04 DIAGNOSIS — I341 Nonrheumatic mitral (valve) prolapse: Secondary | ICD-10-CM

## 2013-07-04 DIAGNOSIS — R079 Chest pain, unspecified: Secondary | ICD-10-CM

## 2013-07-04 DIAGNOSIS — I340 Nonrheumatic mitral (valve) insufficiency: Secondary | ICD-10-CM

## 2013-07-04 MED ORDER — METOPROLOL TARTRATE 25 MG PO TABS: ORAL_TABLET | ORAL | Status: DC

## 2013-07-04 MED ORDER — NITROGLYCERIN 0.4 MG SL SUBL: 0.4000 mg | SUBLINGUAL_TABLET | SUBLINGUAL | Status: DC | PRN

## 2013-07-04 NOTE — Patient Instructions (Signed)
Your physician has recommended you make the following change in your medication:   1. Start Metoprolol tartrate 25 mg 1/2 tablet by mouth twice a day.  2. Start Nitroglycerin 0.4 mg as needed for CP.  Your physician recommends that you schedule a follow-up appointment in: 4-6 weeks with Dr. [name].

## 2013-07-04 NOTE — Progress Notes (Signed)
[address], [address] [place], [place]  [address] Phone: [phone] Fax:  [phone]  Date:  07/04/2013   ID:  Cassie White, DOB Jan 10, 1941, MRN [id number]  PCP:  Marga Melnick, MD  Cardiologist:  Dr. Peter [name]     History of Present Illness: Cassie White is a 72 y.o. female with a hx of MVP, mitral regurgitation, HL, GERD.  She was evaluated by Dr. Peter [name] in 09/2012 for exertional chest tightness.  She previously had a myoview in 2010 with anterior breast attenuation, o/w normal.  She underwent stress testing with a Lexiscan Myoview (2/14):  Low risk, small ant defect likely breast attenuation, no ischemia, EF 69% (no change from 2010).    Echo (2/14):  EF 60-65%, Gr 1 DD, mild AI, mild bileaflet MVP, mod post directed MR, mild LAE, PASP 35.  She was recently seen by her PCP with symptoms of chest pain with exertion.  PFTs were ordered (results not in electronic chart).  CXR was unremarkable.    She continues to note chest tightness with increased amounts of activity.  This typically occurs if she does a lot of housework or yard work.  She will have substernal tightness that lasts for approx 30 minutes after she stops her activity.  No radiating symptoms.  No assoc dyspnea, diaphoresis, nausea.  She denies orthopnea, PND, edema.  She denies syncope.  Theses symptoms are similar to what she had in 09/2012 when she had a low risk myoview.  She notes that her symptoms are largely unchanged.  They may be more frequent.  She denies pleuritic CP or CP with lying supine.  She has not done her PFTs yet.   Recent Labs: 09/07/2012: ALT 19; Creatinine 0.7; Direct LDL 87.5; HDL 101.60; Hemoglobin 13.6; Potassium 4.1; TSH 0.61  CXR (06/14/2013):   IMPRESSION: No active cardiopulmonary disease.   Wt Readings from Last 3 Encounters:  07/04/13 151 lb 6.4 oz (68.675 kg)  06/14/13 151 lb 6.4 oz (68.675 kg)  09/21/12 148 lb (67.132 kg)     Past Medical History  Diagnosis Date  .  Arthritis     DJD  . Hyperlipidemia   . GERD (gastroesophageal reflux disease)   . Osteoporosis, senile   . Diverticulosis   . Scoliosis   . Peripheral neuropathy   . Hx of cardiovascular stress test     Lexiscan Myoview (2/14):  Low risk, small ant defect likely breast attenuation, no ischemia, EF 69% (no change from 2010).    . Mitral regurgitation     Echo (2/14):  EF 60-65%, Gr 1 DD, mild AI, mild bileaflet MVP, mod post directed MR, mild LAE, PASP 35.  Marland Kitchen MVP (mitral valve prolapse)     Current Outpatient Prescriptions  Medication Sig Dispense Refill  . Ascorbic Acid (VITAMIN C) 500 MG tablet Take 500 mg by mouth daily.        Marland Kitchen aspirin 81 MG tablet Take 81 mg by mouth daily.        . Biotin 5000 MCG CAPS Take by mouth daily.      . Calcium Carbonate-Vitamin D (CALCIUM + D PO) Take by mouth. 1700 mg daily       . cyanocobalamin (,VITAMIN B-12,) 1000 MCG/ML injection INJECT INTO THE MUSCLE EVERY 30 DAYS  10 mL  5  . ergocalciferol (VITAMIN D2) 50000 UNITS capsule Take 50,000 Units by mouth once a week.        . gabapentin (NEURONTIN)  100 MG tablet Take 100 mg by mouth as directed. 1 @ Lunch, 1 @ dinner, 2 @ bedtime      . Glucosamine Sulfate 500 MG CAPS Take by mouth.        . methocarbamol (ROBAXIN) 500 MG tablet Take 500 mg by mouth as needed.        . pravastatin (PRAVACHOL) 20 MG tablet TAKE 1 TABLET BY MOUTH MONDAY,WEDNESDAY AND FRIDAY  90 tablet  2  . traMADol (ULTRAM) 50 MG tablet Take 1 tablet (50 mg total) by mouth every 8 (eight) hours as needed.  30 tablet  0   No current facility-administered medications for this visit.    Allergies:   Sulfonamide derivatives   Social History:  The patient  reports that she has never smoked. She does not have any smokeless tobacco history on file. She reports that she does not drink alcohol or use illicit drugs.   Family History:  The patient's family history includes Coronary artery disease in her brother; Heart attack in her  father; Heart attack (age of onset: 11) in her son; Ovarian cancer in her mother; Stroke (age of onset: 80) in her father. There is no history of Diabetes.   ROS:  Please see the history of present illness.   She has occ indigestion.   All other systems reviewed and negative.   PHYSICAL EXAM: VS:  BP 150/80  Pulse 90  Ht 5\' 3"  (1.6 m)  Wt 151 lb 6.4 oz (68.675 kg)  BMI 26.83 kg/m2 Well nourished, well developed, in no acute distress HEENT: normal Neck: no JVD Vascular: No carotid bruits Cardiac:  normal S1, S2; RRR; no murmur Lungs:  clear to auscultation bilaterally, no wheezing, rhonchi or rales Abd: soft, nontender, no hepatomegaly Ext: no edema Skin: warm and dry Neuro:  CNs 2-12 intact, no focal abnormalities noted  EKG:  NSR, HR 92, normal axis, no acute change     ASSESSMENT AND PLAN:  1. Chest Pain: She has exertional chest discomfort. This has been overall stable for the last year. She had a low risk Myoview less than 12 months ago. Her ECG is unchanged. Exact etiology of her symptoms is not clear.  Given her recent low risk myoview, I have suggested a trial of medical Rx. I will place her on low dose beta blocker with metoprolol tartrate 12.5 mg bid.  I will give her NTG to use prn.  She will have her PFTs soon.  If she fails medical Rx or her symptoms worsen, we may need to consider invasive evaluation.   2. Mitral Regurgitation:  Moderate by recent echo.  Her symptoms are not likely related to this.   3. Hyperlipidemia:  F/u by primary care. 4. GERD:  If symptoms persist, could consider adding H2RA or PPI.  Although, her symptoms are atypical for GERD. 5. Disposition: f/u with Dr. Peter [name] in 6-8 weeks.   Signed, Tereso Newcomer, PA-C  07/04/2013 2:16 PM

## 2013-07-15 ENCOUNTER — Ambulatory Visit (INDEPENDENT_AMBULATORY_CARE_PROVIDER_SITE_OTHER): Payer: Medicare Other | Admitting: Internal Medicine

## 2013-07-15 DIAGNOSIS — R059 Cough, unspecified: Secondary | ICD-10-CM | POA: Diagnosis not present

## 2013-07-15 DIAGNOSIS — R05 Cough: Secondary | ICD-10-CM | POA: Diagnosis not present

## 2013-07-15 LAB — PULMONARY FUNCTION TEST
DL/VA % pred: 101 %
DLCO unc: 20.05 ml/min/mmHg
FEF2575-%Change-Post: 11 %
FEF2575-%Pred-Pre: 173 %
FEV1-%Change-Post: 1 %
FEV1-%Pred-Post: 107 %
FEV1FVC-%Pred-Pre: 117 %
FEV6-%Pred-Post: 95 %
FEV6-Post: 2.54 L
FEV6-Pre: 2.52 L
FEV6FVC-%Pred-Pre: 105 %
FVC-%Pred-Pre: 90 %
FVC-Post: 2.54 L
Post FEV1/FVC ratio: 89 %
Post FEV6/FVC ratio: 100 %
Pre FEV6/FVC Ratio: 100 %
RV: 1.93 L
TLC % pred: 92 %
TLC: 4.51 L

## 2013-07-15 NOTE — Progress Notes (Signed)
PFT done today. 

## 2013-07-26 ENCOUNTER — Encounter: Payer: Self-pay | Admitting: *Deleted

## 2013-08-02 ENCOUNTER — Other Ambulatory Visit: Payer: Self-pay

## 2013-08-02 DIAGNOSIS — R079 Chest pain, unspecified: Secondary | ICD-10-CM

## 2013-08-02 MED ORDER — METOPROLOL TARTRATE 25 MG PO TABS: ORAL_TABLET | ORAL | Status: DC

## 2013-09-02 ENCOUNTER — Ambulatory Visit: Payer: Medicare Other | Admitting: Cardiology

## 2013-09-06 ENCOUNTER — Inpatient Hospital Stay (HOSPITAL_BASED_OUTPATIENT_CLINIC_OR_DEPARTMENT_OTHER)
Admission: EM | Admit: 2013-09-06 | Discharge: 2013-09-07 | DRG: 392 | Disposition: A | Discharge status: Home / Self Care | Payer: Medicare Other | Attending: Internal Medicine | Admitting: Internal Medicine

## 2013-09-06 ENCOUNTER — Encounter (HOSPITAL_BASED_OUTPATIENT_CLINIC_OR_DEPARTMENT_OTHER): Payer: Self-pay | Admitting: Emergency Medicine

## 2013-09-06 DIAGNOSIS — Z823 Family history of stroke: Secondary | ICD-10-CM | POA: Diagnosis not present

## 2013-09-06 DIAGNOSIS — M810 Age-related osteoporosis without current pathological fracture: Secondary | ICD-10-CM | POA: Diagnosis present

## 2013-09-06 DIAGNOSIS — Z96659 Presence of unspecified artificial knee joint: Secondary | ICD-10-CM

## 2013-09-06 DIAGNOSIS — E785 Hyperlipidemia, unspecified: Secondary | ICD-10-CM | POA: Diagnosis present

## 2013-09-06 DIAGNOSIS — I059 Rheumatic mitral valve disease, unspecified: Secondary | ICD-10-CM | POA: Diagnosis present

## 2013-09-06 DIAGNOSIS — Z8249 Family history of ischemic heart disease and other diseases of the circulatory system: Secondary | ICD-10-CM

## 2013-09-06 DIAGNOSIS — R5381 Other malaise: Secondary | ICD-10-CM | POA: Diagnosis present

## 2013-09-06 DIAGNOSIS — Z8041 Family history of malignant neoplasm of ovary: Secondary | ICD-10-CM

## 2013-09-06 DIAGNOSIS — E872 Acidosis, unspecified: Secondary | ICD-10-CM | POA: Diagnosis present

## 2013-09-06 DIAGNOSIS — R112 Nausea with vomiting, unspecified: Principal | ICD-10-CM | POA: Diagnosis present

## 2013-09-06 DIAGNOSIS — E860 Dehydration: Secondary | ICD-10-CM

## 2013-09-06 DIAGNOSIS — K219 Gastro-esophageal reflux disease without esophagitis: Secondary | ICD-10-CM | POA: Diagnosis present

## 2013-09-06 DIAGNOSIS — D510 Vitamin B12 deficiency anemia due to intrinsic factor deficiency: Secondary | ICD-10-CM | POA: Diagnosis present

## 2013-09-06 DIAGNOSIS — R5383 Other fatigue: Secondary | ICD-10-CM | POA: Diagnosis present

## 2013-09-06 LAB — COMPREHENSIVE METABOLIC PANEL
ALBUMIN: 3.6 g/dL (ref 3.5–5.2)
ALT: 20 U/L (ref 0–35)
AST: 28 U/L (ref 0–37)
Alkaline Phosphatase: 59 U/L (ref 39–117)
BILIRUBIN TOTAL: 0.6 mg/dL (ref 0.3–1.2)
BUN: 15 mg/dL (ref 6–23)
CHLORIDE: 101 meq/L (ref 96–112)
CO2: 21 mEq/L (ref 19–32)
CREATININE: 0.6 mg/dL (ref 0.50–1.10)
Calcium: 9.4 mg/dL (ref 8.4–10.5)
GFR calc Af Amer: >90 mL/min (ref >90)
GFR calc non Af Amer: 89 mL/min — ABNORMAL LOW (ref >90)
GLUCOSE: 142 mg/dL — AB (ref 70–99)
Potassium: 3.8 mEq/L (ref 3.7–5.3)
Sodium: 137 mEq/L (ref 137–147)
Total Protein: 7.1 g/dL (ref 6.0–8.3)

## 2013-09-06 LAB — CBC
HEMATOCRIT: 39.9 % (ref 36.0–46.0)
HEMOGLOBIN: 13.4 g/dL (ref 12.0–15.0)
MCH: 30.3 pg (ref 26.0–34.0)
MCHC: 33.6 g/dL (ref 30.0–36.0)
MCV: 90.3 fL (ref 78.0–100.0)
Platelets: 168 10*3/uL (ref 150–400)
RBC: 4.42 MIL/uL (ref 3.87–5.11)
RDW: 14.1 % (ref 11.5–15.5)
WBC: 5.6 10*3/uL (ref 4.0–10.5)

## 2013-09-06 LAB — CG4 I-STAT (LACTIC ACID): LACTIC ACID, VENOUS: 3.17 mmol/L — AB (ref 0.5–2.2)

## 2013-09-06 LAB — TROPONIN I

## 2013-09-06 MED ORDER — SODIUM CHLORIDE 0.9 % IV BOLUS (SEPSIS)
1000.0000 mL | Freq: Once | INTRAVENOUS | Status: AC
  Administered 2013-09-06: 1000 mL via INTRAVENOUS

## 2013-09-06 MED ORDER — ONDANSETRON HCL 4 MG/2ML IJ SOLN
4.0000 mg | Freq: Once | INTRAMUSCULAR | Status: AC
  Administered 2013-09-06: 4 mg via INTRAVENOUS
  Filled 2013-09-06: qty 2

## 2013-09-06 NOTE — ED Notes (Signed)
Pt in with c/o flu like symptoms with some occasional V this morning.

## 2013-09-06 NOTE — ED Provider Notes (Signed)
CSN: [id number]     Arrival date & time 09/06/13  2106 History  This chart was scribed for Osvaldo Shipper, MD by Zettie Pho, ED Scribe. This patient was seen in room MH05/MH05 and the patient's care was started at 10:02 PM.    Chief Complaint  Patient presents with  . Influenza   Patient is a 73 y.o. female presenting with flu symptoms. The history is provided by the patient. No language interpreter was used.  Influenza Presenting symptoms: myalgias, nausea, rhinorrhea and vomiting   Presenting symptoms: no cough, no diarrhea, no fever, no shortness of breath and no sore throat   Myalgias:    Location:  Generalized   Severity:  Moderate   Onset quality:  Gradual   Timing:  Constant   Progression:  Worsening Nausea:    Severity:  Moderate   Onset quality:  Gradual   Timing:  Constant   Progression:  Worsening Rhinorrhea:    Quality:  Clear   Severity:  Mild   Timing:  Intermittent   Progression:  Unchanged Severity:  Moderate Onset quality:  Gradual Progression:  Worsening Chronicity:  New Risk factors: being elderly and heart disease    HPI Comments: Cassie White is a 73 y.o. female who presents to the Emergency Department complaining of influenza-like symptoms including nausea, diffuse myalgias, and rhinorrhea onset last night that has been progressively worsening. Patient reports some associated emesis (7-8 episodes today) that was non-bloody and decreased urine output onset earlier today. She denies fever, cough, shortness of breath, sore throat, abdominal pain, diarrhea, dysuria. Patient has a history of hyperlipidemia, diverticulosis, peripheral neuropathy, mitral regurgitation, and mitral valve prolapse.   Past Medical History  Diagnosis Date  . Arthritis     DJD  . Hyperlipidemia   . GERD (gastroesophageal reflux disease)   . Osteoporosis, senile   . Diverticulosis   . Scoliosis   . Peripheral neuropathy   . Hx of cardiovascular stress test     Lexiscan  Myoview (2/14):  Low risk, small ant defect likely breast attenuation, no ischemia, EF 69% (no change from 2010).    . Mitral regurgitation     Echo (2/14):  EF 60-65%, Gr 1 DD, mild AI, mild bileaflet MVP, mod post directed MR, mild LAE, PASP 35.  Marland Kitchen MVP (mitral valve prolapse)    Past Surgical History  Procedure Laterality Date  . Tonsillectomy    . Vaginal hysterectomy      For Uterine Deviation   . 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 Cholecystectomy  03/2010    Dr.Gross  . Lumpectomy  06/2009    "Fatty Necrosis"  . Breast surgery  Nov 2010    Benign biopsy, right  . Cystocele repair    . Combined hysterectomy vaginal / oophorectomy / a&p repair  2841    uncertain if ovaries were removed or not   Family History  Problem Relation Age of Onset  . Heart attack Father     in 21s  . Stroke Father 25  . Ovarian cancer Mother   . Coronary artery disease Brother   . Heart attack Son 15    Smoker  . Diabetes Neg Hx    History  Substance Use Topics  . Smoking status: Never Smoker   . Smokeless tobacco: Not on file  . Alcohol Use: No   OB History   Grav Para Term Preterm Abortions TAB SAB Ect Mult Living  Review of Systems  Constitutional: Negative for fever.  HENT: Positive for rhinorrhea. Negative for sore throat.   Respiratory: Negative for cough and shortness of breath.   Gastrointestinal: Positive for nausea and vomiting. Negative for abdominal pain and diarrhea.  Genitourinary: Positive for decreased urine volume. Negative for dysuria.  Musculoskeletal: Positive for myalgias.  All other systems reviewed and are negative.    Allergies  Sulfonamide derivatives  Home Medications   Current Outpatient Rx  Name  Route  Sig  Dispense  Refill  . Ascorbic Acid (VITAMIN C) 500 MG tablet   Oral   Take 500 mg by mouth daily.           Marland Kitchen aspirin 81 MG tablet   Oral   Take 81 mg by mouth daily.           . Biotin 5000 MCG  CAPS   Oral   Take by mouth daily.         . Calcium Carbonate-Vitamin D (CALCIUM + D PO)   Oral   Take by mouth. 1700 mg daily          . cyanocobalamin (,VITAMIN B-12,) 1000 MCG/ML injection      INJECT 1ML INTO THE MUSCLE EVERY 30 DAYS   10 mL   5     LABS OVERDUE   . ergocalciferol (VITAMIN D2) 50000 UNITS capsule   Oral   Take 50,000 Units by mouth once a week.           . gabapentin (NEURONTIN) 100 MG tablet   Oral   Take 100 mg by mouth as directed. 1 @ Lunch, 1 @ dinner, 2 @ bedtime         . Glucosamine Sulfate 500 MG CAPS   Oral   Take by mouth.           . methocarbamol (ROBAXIN) 500 MG tablet   Oral   Take 500 mg by mouth as needed.           . metoprolol tartrate (LOPRESSOR) 25 MG tablet      1/2 tablet by mouth twice a day   90 tablet   3   . nitroGLYCERIN (NITROSTAT) 0.4 MG SL tablet   Sublingual   Place 1 tablet (0.4 mg total) under the tongue every 5 (five) minutes as needed for chest pain.   25 tablet   5   . pravastatin (PRAVACHOL) 20 MG tablet      TAKE 1 TABLET BY MOUTH MONDAY,WEDNESDAY AND FRIDAY   90 tablet   2   . traMADol (ULTRAM) 50 MG tablet   Oral   Take 1 tablet (50 mg total) by mouth every 8 (eight) hours as needed.   30 tablet   0    Triage Vitals: BP 136/74  Pulse 93  Temp(Src) 98.9 F (37.2 C) (Oral)  Resp 18  Wt 143 lb (64.864 kg)  SpO2 99%  Physical Exam  Nursing note and vitals reviewed. Constitutional: She is oriented to person, place, and time. She appears well-developed and well-nourished. No distress.  HENT:  Head: Normocephalic and atraumatic.  Mouth/Throat: Oropharynx is clear and moist. No oropharyngeal exudate.  Eyes: Conjunctivae are normal.  Neck: Normal range of motion. Neck supple.  Cardiovascular: Normal rate, regular rhythm and normal heart sounds.   Pulmonary/Chest: Effort normal and breath sounds normal. No respiratory distress.  Abdominal: Soft. Bowel sounds are normal. She  exhibits no distension. There is no tenderness. There is no  rebound and no guarding.  Musculoskeletal: Normal range of motion.  Neurological: She is alert and oriented to person, place, and time.  Skin: Skin is warm and dry.  Psychiatric: She has a normal mood and affect. Her behavior is normal.    ED Course  Procedures (including critical care time)  DIAGNOSTIC STUDIES: Oxygen Saturation is 99% on room air, normal by my interpretation.    COORDINATION OF CARE: 10:06 PM- Will order blood labs and UA. Will order IV fluids and Zofran to manage symptoms. Discussed treatment plan with patient at bedside and patient verbalized agreement.     Labs Review Labs Reviewed - No data to display Imaging Review No results found.  EKG Interpretation    Date/Time:  Tuesday September 06 2013 22:22:36 EST Ventricular Rate:  77 PR Interval:  196 QRS Duration: 96 QT Interval:  396 QTC Calculation: 448 R Axis:   20 Text Interpretation:  Normal sinus rhythm T wave flattening laterally, new from previous Confirmed by Mingo Amber  MD, [place] ([id number]) on 09/06/2013 10:45:41 PM            MDM   1. Dehydration    38F presents with vomiting. Began last night. 7-8 episodes, no hematemesis. Associated diffuse aching. She has mild rhinorrhea, sore throat. No cough, no SOB, no CP. Hx of osteoporosis. Denies dysuria. Mild decreased urination. Patient with stable vitals, benign exam. Very mild epigastric pain, likely due to multiple episodes of vomiting. Labs with elevated lactate, otherwise normal. Nausea improved. Patient would like to go home with tamiflu. Will repeat lactic acid. Repeat lactate elevated. Will admit for Observation to [facility].   I personally performed the services described in this documentation, which was scribed in my presence. The recorded information has been reviewed and is accurate.      Osvaldo Shipper, MD 09/07/13 [place]

## 2013-09-07 ENCOUNTER — Encounter (HOSPITAL_COMMUNITY): Payer: Self-pay | Admitting: *Deleted

## 2013-09-07 DIAGNOSIS — E872 Acidosis, unspecified: Secondary | ICD-10-CM | POA: Diagnosis present

## 2013-09-07 DIAGNOSIS — R112 Nausea with vomiting, unspecified: Principal | ICD-10-CM

## 2013-09-07 DIAGNOSIS — E860 Dehydration: Secondary | ICD-10-CM

## 2013-09-07 LAB — CG4 I-STAT (LACTIC ACID): Lactic Acid, Venous: 3.84 mmol/L — ABNORMAL HIGH (ref 0.5–2.2)

## 2013-09-07 LAB — URINALYSIS, ROUTINE W REFLEX MICROSCOPIC
Bilirubin Urine: NEGATIVE
GLUCOSE, UA: NEGATIVE mg/dL
Hgb urine dipstick: NEGATIVE
KETONES UR: NEGATIVE mg/dL
Leukocytes, UA: NEGATIVE
Nitrite: NEGATIVE
Protein, ur: NEGATIVE mg/dL
Specific Gravity, Urine: 1.011 (ref 1.005–1.030)
UROBILINOGEN UA: 0.2 mg/dL (ref 0.0–1.0)
pH: 6 (ref 5.0–8.0)

## 2013-09-07 LAB — CBC WITH DIFFERENTIAL/PLATELET
BASOS ABS: 0 10*3/uL (ref 0.0–0.1)
Basophils Relative: 0 % (ref 0–1)
EOS PCT: 0 % (ref 0–5)
Eosinophils Absolute: 0 10*3/uL (ref 0.0–0.7)
HCT: 35.6 % — ABNORMAL LOW (ref 36.0–46.0)
Hemoglobin: 12.1 g/dL (ref 12.0–15.0)
LYMPHS PCT: 25 % (ref 12–46)
Lymphs Abs: 0.8 10*3/uL (ref 0.7–4.0)
MCH: 30.3 pg (ref 26.0–34.0)
MCHC: 34 g/dL (ref 30.0–36.0)
MCV: 89 fL (ref 78.0–100.0)
Monocytes Absolute: 0.5 10*3/uL (ref 0.1–1.0)
Monocytes Relative: 17 % — ABNORMAL HIGH (ref 3–12)
NEUTROS ABS: 1.8 10*3/uL (ref 1.7–7.7)
Neutrophils Relative %: 58 % (ref 43–77)
PLATELETS: 138 10*3/uL — AB (ref 150–400)
RBC: 4 MIL/uL (ref 3.87–5.11)
RDW: 14 % (ref 11.5–15.5)
WBC: 3.1 10*3/uL — AB (ref 4.0–10.5)

## 2013-09-07 LAB — COMPREHENSIVE METABOLIC PANEL
ALT: 17 U/L (ref 0–35)
AST: 21 U/L (ref 0–37)
Albumin: 2.9 g/dL — ABNORMAL LOW (ref 3.5–5.2)
Alkaline Phosphatase: 49 U/L (ref 39–117)
BILIRUBIN TOTAL: 0.4 mg/dL (ref 0.3–1.2)
BUN: 8 mg/dL (ref 6–23)
CHLORIDE: 111 meq/L (ref 96–112)
CO2: 24 mEq/L (ref 19–32)
CREATININE: 0.55 mg/dL (ref 0.50–1.10)
Calcium: 8.4 mg/dL (ref 8.4–10.5)
GLUCOSE: 95 mg/dL (ref 70–99)
Potassium: 4.3 mEq/L (ref 3.7–5.3)
Sodium: 144 mEq/L (ref 137–147)
Total Protein: 5.7 g/dL — ABNORMAL LOW (ref 6.0–8.3)

## 2013-09-07 LAB — INFLUENZA PANEL BY PCR (TYPE A & B)
H1N1 flu by pcr: NOT DETECTED
INFLBPCR: NEGATIVE
Influenza A By PCR: NEGATIVE

## 2013-09-07 LAB — LACTIC ACID, PLASMA
LACTIC ACID, VENOUS: 1.7 mmol/L (ref 0.5–2.2)
Lactic Acid, Venous: 0.9 mmol/L (ref 0.5–2.2)

## 2013-09-07 MED ORDER — SODIUM CHLORIDE 0.9 % IV SOLN
INTRAVENOUS | Status: DC
Start: 2013-09-07 — End: 2013-09-07

## 2013-09-07 MED ORDER — SIMVASTATIN 10 MG PO TABS
10.0000 mg | ORAL_TABLET | Freq: Every day | ORAL | Status: DC
  Filled 2013-09-07: qty 1

## 2013-09-07 MED ORDER — NITROGLYCERIN 0.4 MG SL SUBL: 0.4000 mg | SUBLINGUAL_TABLET | SUBLINGUAL | Status: DC | PRN

## 2013-09-07 MED ORDER — METOPROLOL TARTRATE 12.5 MG HALF TABLET
12.5000 mg | ORAL_TABLET | Freq: Two times a day (BID) | ORAL | Status: DC
  Filled 2013-09-07 (×2): qty 1

## 2013-09-07 MED ORDER — ERGOCALCIFEROL 1.25 MG (50000 UT) PO CAPS
50000.0000 [IU] | ORAL_CAPSULE | ORAL | Status: DC
  Administered 2013-09-07: 50000 [IU] via ORAL
  Filled 2013-09-07: qty 1

## 2013-09-07 MED ORDER — METHOCARBAMOL 500 MG PO TABS: 500.0000 mg | ORAL_TABLET | ORAL | Status: DC | PRN

## 2013-09-07 MED ORDER — SODIUM CHLORIDE 0.9 % IV BOLUS (SEPSIS)
1000.0000 mL | Freq: Once | INTRAVENOUS | Status: AC
  Administered 2013-09-07: 1000 mL via INTRAVENOUS

## 2013-09-07 MED ORDER — ACETAMINOPHEN 325 MG PO TABS: 650.0000 mg | ORAL_TABLET | Freq: Four times a day (QID) | ORAL | Status: DC | PRN

## 2013-09-07 MED ORDER — ONDANSETRON HCL 4 MG PO TABS: 4.0000 mg | ORAL_TABLET | Freq: Four times a day (QID) | ORAL | Status: DC | PRN

## 2013-09-07 MED ORDER — VITAMIN C 500 MG PO TABS
500.0000 mg | ORAL_TABLET | Freq: Every day | ORAL | Status: DC
  Administered 2013-09-07: 500 mg via ORAL
  Filled 2013-09-07: qty 1

## 2013-09-07 MED ORDER — ENOXAPARIN SODIUM 40 MG/0.4ML ~~LOC~~ SOLN
40.0000 mg | SUBCUTANEOUS | Status: DC
  Administered 2013-09-07: 40 mg via SUBCUTANEOUS
  Filled 2013-09-07: qty 0.4

## 2013-09-07 MED ORDER — ASPIRIN 81 MG PO TABS: 81.0000 mg | ORAL_TABLET | Freq: Every day | ORAL | Status: DC

## 2013-09-07 MED ORDER — GABAPENTIN 100 MG PO CAPS
100.0000 mg | ORAL_CAPSULE | Freq: Two times a day (BID) | ORAL | Status: DC
  Administered 2013-09-07: 100 mg via ORAL
  Filled 2013-09-07 (×2): qty 1

## 2013-09-07 MED ORDER — GABAPENTIN 100 MG PO CAPS
200.0000 mg | ORAL_CAPSULE | Freq: Every day | ORAL | Status: DC
  Filled 2013-09-07: qty 2

## 2013-09-07 MED ORDER — ACETAMINOPHEN 650 MG RE SUPP: 650.0000 mg | Freq: Four times a day (QID) | RECTAL | Status: DC | PRN

## 2013-09-07 MED ORDER — ONDANSETRON HCL 4 MG/2ML IJ SOLN
4.0000 mg | Freq: Four times a day (QID) | INTRAMUSCULAR | Status: DC | PRN
Start: 2013-09-07 — End: 2013-09-07

## 2013-09-07 MED ORDER — TRAMADOL HCL 50 MG PO TABS: 50.0000 mg | ORAL_TABLET | Freq: Three times a day (TID) | ORAL | Status: DC | PRN

## 2013-09-07 MED ORDER — ASPIRIN EC 81 MG PO TBEC
81.0000 mg | DELAYED_RELEASE_TABLET | Freq: Every day | ORAL | Status: DC
Start: 2013-09-07 — End: 2013-09-07
  Administered 2013-09-07: 81 mg via ORAL
  Filled 2013-09-07: qty 1

## 2013-09-07 NOTE — Progress Notes (Signed)
UR completed. Patient changed to inpatient- requiring IVF@ 100cc/hr  

## 2013-09-07 NOTE — Progress Notes (Signed)
Pt seen and examined, admitted this am by Dr.Kakrakandy Suspect viral gastroenteritis, Lactic acid still up, but clinically improving IVF Advance diet Home later today or in am Repeat lactic acid  Domenic Polite, MD [phone]

## 2013-09-07 NOTE — Progress Notes (Signed)
NURSING PROGRESS NOTE  Cassie White [id number] Admission Data: 09/07/2013 6:31 AM Attending Provider: Rise Patience, MD UEK:[id number] Linna Darner, MD Code Status: full code   Cassie White is a 73 y.o. female patient admitted from ED:  -No acute distress noted.  -No complaints of shortness of breath.  -No complaints of chest pain.   Cardiac Monitoring: Box # N/A in place. Cardiac monitor yields;N/A.  Blood pressure 137/75, pulse 68, temperature 98.3 F (36.8 C), temperature source Oral, resp. rate 20, height 5' 3.5" (1.613 m), weight 65.772 kg (145 lb), SpO2 95.00%.   IV Fluids:  IV in place, occlusive dsg intact without redness, IV cath forearm right, condition patent and no redness normal saline.   Allergies:  Sulfonamide derivatives  Past Medical History:   has a past medical history of Arthritis; Hyperlipidemia; GERD (gastroesophageal reflux disease); Osteoporosis, senile; Diverticulosis; Scoliosis; Peripheral neuropathy; cardiovascular stress test; Mitral regurgitation; and MVP (mitral valve prolapse).  Past Surgical History:   has past surgical history that includes Tonsillectomy; Vaginal hysterectomy; Total knee arthroplasty (09/2009); Cholecystectomy (03/2010); Lumpectomy (06/2009); Breast surgery (Nov 2010); Cystocele repair; and Combined hysterectomy vaginal / oophorectomy / A&P repair (1990).  Social History:   reports that she has never smoked. She does not have any smokeless tobacco history on file. She reports that she does not drink alcohol or use illicit drugs.  Skin: NSI  Patient/Family orientated to room. Information packet given to patient/family. Admission inpatient armband information verified with patient/family to include name and date of birth and placed on patient arm. Side rails up x 2, fall assessment and education completed with patient/family. Patient/family able to verbalize understanding of risk associated with falls and verbalized understanding to  call for assistance before getting out of bed. Call light within reach. Patient/family able to voice and demonstrate understanding of unit orientation instructions.    Will continue to evaluate and treat per MD orders.

## 2013-09-07 NOTE — ED Notes (Signed)
Report to [facility] with [facility]

## 2013-09-07 NOTE — Progress Notes (Signed)
Cassie White to be D/C'd Home per MD order.  Discussed with the patient and all questions fully answered.    Medication List         aspirin 81 MG tablet  Take 81 mg by mouth daily.     Biotin 5000 MCG Caps  Take 5,000 mcg by mouth daily.     calcium-vitamin D 500-200 MG-UNIT per tablet  Commonly known as:  OSCAL WITH D  Take 1 tablet by mouth 2 (two) times daily.     cholecalciferol 1000 UNITS tablet  Commonly known as:  VITAMIN D  Take 1,000 Units by mouth daily.     cyanocobalamin 1000 MCG/ML injection  Commonly known as:  (VITAMIN B-12)  Inject 1,000 mcg into the muscle every 30 (thirty) days. On the 24th of the month     gabapentin 100 MG capsule  Commonly known as:  NEURONTIN  Take 100-200 mg by mouth 3 (three) times daily. Take  1 capsule at lunch, take 1 capsule at dinner, take 2 capsules at bedtime     glucosamine-chondroitin 500-400 MG tablet  Take 1 tablet by mouth daily.     MENEST 0.625 MG tablet  Generic drug:  esterified estrogens  Take 0.625 mg by mouth daily.     methocarbamol 500 MG tablet  Commonly known as:  ROBAXIN  Take 500 mg by mouth daily as needed for muscle spasms.     metoprolol tartrate 25 MG tablet  Commonly known as:  LOPRESSOR  Take 12.5 mg by mouth 2 (two) times daily.     nitroGLYCERIN 0.4 MG SL tablet  Commonly known as:  NITROSTAT  Place 1 tablet (0.4 mg total) under the tongue every 5 (five) minutes as needed for chest pain.     pravastatin 20 MG tablet  Commonly known as:  PRAVACHOL  Take 20 mg by mouth 3 (three) times a week. Take on Monday, Wednesday, and friday     traMADol 50 MG tablet  Commonly known as:  ULTRAM  Take 50 mg by mouth every 8 (eight) hours as needed for moderate pain.     vitamin C 500 MG tablet  Commonly known as:  ASCORBIC ACID  Take 500 mg by mouth daily.        VVS, Skin clean, dry and intact without evidence of skin break down, no evidence of skin tears noted. IV catheter discontinued  intact. Site without signs and symptoms of complications. Dressing and pressure applied.  An After Visit Summary was printed and given to the patient.  D/c education completed with patient/family including follow up instructions, medication list, d/c activities limitations if indicated, with other d/c instructions as indicated by MD - patient able to verbalize understanding, all questions fully answered.   Patient instructed to return to ED, call 911, or call MD for any changes in condition.   Patient escorted via [place], and D/C home via private auto.  Delman Cheadle 09/07/2013 5:35 PM

## 2013-09-07 NOTE — ED Notes (Signed)
Report called to [facility] on 5W

## 2013-09-07 NOTE — H&P (Signed)
[facility] History and Physical  JAMIL CASTILLO VHQ:[id number] DOB: 1941/03/22 DOA: 09/06/2013  Referring physician: Patient was transferred from [facility]. PCP: Unice Cobble, MD   Chief Complaint: Nausea vomiting and weakness.  HPI: Cassie White is a 73 y.o. female history of pernicious anemia, hyperlipidemia, mitral regurgitation has been experiencing nausea vomiting since night before last. Last afternoon was the last episode she had vomiting. Denies any abdominal pain diarrhea fever chills. Since patient was having multiple episodes of nausea vomiting patient has not had anything to eat. Patient which they felt weak and presented to the [place]. Over there labs reveal increased lactic acid levels. Patient did not have any abdominal pain and on exam patient's abdomen was benign. After IV fluids that he has never still elevated at this time patient has been admitted for hydration. Patient on my exam is appearing comfortable and denies any pain, shortness of breath and feels like eating something.    Review of Systems: As presented in the history of presenting illness, rest negative.  Past Medical History  Diagnosis Date  . Arthritis     DJD  . Hyperlipidemia   . GERD (gastroesophageal reflux disease)   . Osteoporosis, senile   . Diverticulosis   . Scoliosis   . Peripheral neuropathy   . Hx of cardiovascular stress test     Lexiscan Myoview (2/14):  Low risk, small ant defect likely breast attenuation, no ischemia, EF 69% (no change from 2010).    . Mitral regurgitation     Echo (2/14):  EF 60-65%, Gr 1 DD, mild AI, mild bileaflet MVP, mod post directed MR, mild LAE, PASP 35.  Marland Kitchen MVP (mitral valve prolapse)    Past Surgical History  Procedure Laterality Date  . Tonsillectomy    . Vaginal hysterectomy      For Uterine Deviation   . 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 Cholecystectomy  03/2010    Dr.Gross  . Lumpectomy   06/2009    "Fatty Necrosis"  . Breast surgery  Nov 2010    Benign biopsy, right  . Cystocele repair    . Combined hysterectomy vaginal / oophorectomy / a&p repair  4132    uncertain if ovaries were removed or not   Social History:  reports that she has never smoked. She does not have any smokeless tobacco history on file. She reports that she does not drink alcohol or use illicit drugs. Where does patient live  home.  Can patient participate in ADLs? yesAllergies  Allergen Reactions  . Sulfonamide Derivatives     Facial swelling Because of a history of documented adverse serious drug reaction;Medi Alert bracelet  is recommended     Family History:  Family History  Problem Relation Age of Onset  . Heart attack Father     in 56s  . Stroke Father 88  . Ovarian cancer Mother   . Coronary artery disease Brother   . Heart attack Son 69    Smoker  . Diabetes Neg Hx       Prior to Admission medications   Medication Sig Start Date End Date Taking? Authorizing Provider  Ascorbic Acid (VITAMIN C) 500 MG tablet Take 500 mg by mouth daily.      Historical Provider, MD  aspirin 81 MG tablet Take 81 mg by mouth daily.      Historical Provider, MD  Biotin 5000 MCG CAPS Take by mouth daily.  Historical Provider, MD  Calcium Carbonate-Vitamin D (CALCIUM + D PO) Take by mouth. 1700 mg daily     Historical Provider, MD  cyanocobalamin (,VITAMIN B-12,) 1000 MCG/ML injection INJECT 1ML INTO THE MUSCLE EVERY 30 DAYS 11/28/11   Hendricks Limes, MD  ergocalciferol (VITAMIN D2) 50000 UNITS capsule Take 50,000 Units by mouth once a week.      Historical Provider, MD  gabapentin (NEURONTIN) 100 MG tablet Take 100 mg by mouth as directed. 1 @ Lunch, 1 @ dinner, 2 @ bedtime    Historical Provider, MD  Glucosamine Sulfate 500 MG CAPS Take by mouth.      Historical Provider, MD  methocarbamol (ROBAXIN) 500 MG tablet Take 500 mg by mouth as needed.      Historical Provider, MD  metoprolol tartrate  (LOPRESSOR) 25 MG tablet 1/2 tablet by mouth twice a day 08/02/13   Peter M Martinique, MD  nitroGLYCERIN (NITROSTAT) 0.4 MG SL tablet Place 1 tablet (0.4 mg total) under the tongue every 5 (five) minutes as needed for chest pain. 07/04/13   Liliane Shi, PA-C  pravastatin (PRAVACHOL) 20 MG tablet TAKE 1 TABLET BY MOUTH MONDAY,WEDNESDAY AND FRIDAY 05/15/13   Hendricks Limes, MD  traMADol (ULTRAM) 50 MG tablet Take 1 tablet (50 mg total) by mouth every 8 (eight) hours as needed. 06/14/13   Hendricks Limes, MD    Physical Exam: Filed Vitals:   09/07/13 0022 09/07/13 0023 09/07/13 0024 09/07/13 0247  BP: 120/48 116/51 115/60 137/75  Pulse: 76 78 86 68  Temp:    98.3 F (36.8 C)  TempSrc:    Oral  Resp:    20  Height:    5' 3.5" (1.613 m)  Weight:    65.772 kg (145 lb)  SpO2:    95%     General:  Well-developed well-nourished.  Eyes: Anicteric no pallor.  ENT: No discharge from the ears eyes nose mouth.  Neck: No mass felt.  Cardiovascular: S1-S2 heard.  Respiratory: No rhonchi or crepitations.  Abdomen: Soft nontender bowel sounds present. No guarding no rigidity.  Skin: No rash.  Musculoskeletal: No edema.  Psychiatric: Appears normal.  Neurologic: Alert awake oriented to time place and person. Moves all extremities.  Labs on Admission:  Basic Metabolic Panel:  Recent Labs Lab 09/06/13 2212  NA 137  K 3.8  CL 101  CO2 21  GLUCOSE 142*  BUN 15  CREATININE 0.60  CALCIUM 9.4   Liver Function Tests:  Recent Labs Lab 09/06/13 2212  AST 28  ALT 20  ALKPHOS 59  BILITOT 0.6  PROT 7.1  ALBUMIN 3.6   No results found for this basename: LIPASE, AMYLASE,  in the last 168 hours No results found for this basename: AMMONIA,  in the last 168 hours CBC:  Recent Labs Lab 09/06/13 2212  WBC 5.6  HGB 13.4  HCT 39.9  MCV 90.3  PLT 168   Cardiac Enzymes:  Recent Labs Lab 09/06/13 2212  TROPONINI <0.30    BNP (last 3 results) No results found for this  basename: PROBNP,  in the last 8760 hours CBG: No results found for this basename: GLUCAP,  in the last 168 hours  Radiological Exams on Admission: No results found.  EKG: Independently reviewed. Normal sinus rhythm.  Assessment/Plan Principal Problem:   Dehydration Active Problems:   HYPERLIPIDEMIA   Pernicious anemia   Lactic acidosis   Nausea with vomiting   1. Dehydration secondary to nausea and vomiting -  nausea and vomiting most likely viral etiology. Patient states that she has come in contact with people with influenza. At this time I have continued patient IV fluids and place patient on diet as tolerated and repeat labs are been ordered including lactic acid levels. If patient is able to tolerate diet and lactic acid levels are improving then patient may be discharged home later today. Influenza PCR is pending. 2. Lactic acidosis - probably from #1 reason. 3. Pernicious anemia - on B12 shots monthly. 4. History of hyperlipidemia - continue statins. 5. History of mitral valve regurgitation - presently asymptomatic.  I have reviewed patient's old charts and labs.  Code Status: Full code.  Family Communication: None.  Disposition Plan: Admit for observation.    Nellie Chevalier N. [facility] Pager [phone].  If 7PM-7AM, please contact night-coverage [email or web address] Password [email or web address] 09/07/2013, 5:53 AM

## 2013-09-14 ENCOUNTER — Encounter: Payer: Self-pay | Admitting: Cardiology

## 2013-09-14 ENCOUNTER — Ambulatory Visit (INDEPENDENT_AMBULATORY_CARE_PROVIDER_SITE_OTHER): Payer: Medicare Other | Admitting: Cardiology

## 2013-09-14 VITALS — BP 108/61 | HR 68 | Ht 63.0 in | Wt 148.0 lb

## 2013-09-14 DIAGNOSIS — E785 Hyperlipidemia, unspecified: Secondary | ICD-10-CM

## 2013-09-14 DIAGNOSIS — R079 Chest pain, unspecified: Secondary | ICD-10-CM

## 2013-09-14 DIAGNOSIS — I059 Rheumatic mitral valve disease, unspecified: Secondary | ICD-10-CM | POA: Diagnosis not present

## 2013-09-14 DIAGNOSIS — I341 Nonrheumatic mitral (valve) prolapse: Secondary | ICD-10-CM

## 2013-09-14 NOTE — Progress Notes (Signed)
Cassie White Date of Birth: 10/27/1940 Medical Record #[id number]  History of Present Illness: [name] is seen for follow up of chest tightness. She  has a history of hyperlipidemia. She also has a history of mitral valve prolapse. She has moderate MR by Echo but exam is fairly unremarkable. She was seen by Richardson Dopp in Dec. She was started on metoprolol empirically. She states this has helped. PFTs were normal. Now she only gets tightness if she is in a real hurry or doing something more strenuous. She was admitted recently with N/V and dehyrdation with elevated lactate level. This improved with hydration.  Current Outpatient Prescriptions on File Prior to Visit  Medication Sig Dispense Refill  . aspirin 81 MG tablet Take 81 mg by mouth daily.        . Biotin 5000 MCG CAPS Take 5,000 mcg by mouth daily.      . cholecalciferol (VITAMIN D) 1000 UNITS tablet Take 1,000 Units by mouth daily.      Marland Kitchen MENEST 0.625 MG tablet Take 0.625 mg by mouth daily.       . metoprolol tartrate (LOPRESSOR) 25 MG tablet Take 12.5 mg by mouth 2 (two) times daily.      . nitroGLYCERIN (NITROSTAT) 0.4 MG SL tablet Place 1 tablet (0.4 mg total) under the tongue every 5 (five) minutes as needed for chest pain.  25 tablet  5  . pravastatin (PRAVACHOL) 20 MG tablet Take 20 mg by mouth 3 (three) times a week. Take on Monday, Wednesday, and friday      . traMADol (ULTRAM) 50 MG tablet Take 50 mg by mouth every 8 (eight) hours as needed for moderate pain.       No current facility-administered medications on file prior to visit.    Allergies  Allergen Reactions  . Sulfa Antibiotics Swelling    face    Past Medical History  Diagnosis Date  . Arthritis     DJD  . Hyperlipidemia   . GERD (gastroesophageal reflux disease)   . Osteoporosis, senile   . Diverticulosis   . Scoliosis   . Peripheral neuropathy   . Hx of cardiovascular stress test     Lexiscan Myoview (2/14):  Low risk, small ant defect  likely breast attenuation, no ischemia, EF 69% (no change from 2010).    . Mitral regurgitation     Echo (2/14):  EF 60-65%, Gr 1 DD, mild AI, mild bileaflet MVP, mod post directed MR, mild LAE, PASP 35.  Marland Kitchen MVP (mitral valve prolapse)     Past Surgical History  Procedure Laterality Date  . Tonsillectomy    . Vaginal hysterectomy      For Uterine Deviation   . 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 Cholecystectomy  03/2010    Dr.Gross  . Lumpectomy  06/2009    "Fatty Necrosis"  . Breast surgery  Nov 2010    Benign biopsy, right  . Cystocele repair    . Combined hysterectomy vaginal / oophorectomy / a&p repair  2585    uncertain if ovaries were removed or not    History  Smoking status  . Never Smoker   Smokeless tobacco  . Not on file    History  Alcohol Use No    Family History  Problem Relation Age of Onset  . Heart attack Father     in 8s  . Stroke Father 106  . Ovarian cancer Mother   .  Coronary artery disease Brother   . Heart attack Son 24    Smoker  . Diabetes Neg Hx     Review of Systems: The review of systems is positive for peripheral neuropathy. This limits her ability to to walk.  All other systems were reviewed and are negative.  Physical Exam: BP 108/61  Pulse 68  Ht 5\' 3"  (1.6 m)  Wt 148 lb (67.132 kg)  BMI 26.22 kg/m2 She is a pleasant white female in no acute distress.  The HEENT exam is normal.  The carotids are 2+ without bruits.  There is no thyromegaly.  There is no JVD.  The lungs are clear.   The heart exam reveals a regular rate with a normal S1 and S2.  There is a soft grade 0-3/8 systolic murmur heard best at the apex..  The PMI is not displaced.   Abdominal exam reveals good bowel sounds.  There is no guarding or rebound.  There is no hepatosplenomegaly or tenderness.  There are no masses.  Exam of the legs reveal no clubbing, cyanosis, or edema.   The distal pulses are intact.  Cranial nerves II - XII are intact.   Motor and sensory functions are intact.  The gait is normal.  LABORATORY DATA: ECG demonstrates normal sinus rhythm with low voltage. It is otherwise normal.  Assessment / Plan: 1. Exertional chest tightness.  [place] study last year was normal. Symptoms improved with low dose metoprolol. PFTs are normal. Will continue current therapy. If her symptoms progress will need more definitive coronary evaluation with either cath or CTA. I will follow up in 6 months.  2. Mitral valve prolapse.  3. Hyperlipidemia.

## 2013-09-14 NOTE — Patient Instructions (Signed)
Continue your current therapy   I will see you in 6 months.  Let me know if you experience chest pain or thightness with increased severity or frquency.

## 2013-09-25 NOTE — Discharge Summary (Signed)
Physician Discharge Summary  Cassie White YSA:[id number] DOB: Jan 01, 1941 DOA: 09/06/2013  PCP: Unice Cobble, MD  Admit date: 09/06/2013 Discharge date: 09/07/2013  Time spent: 45 minutes  Recommendations for Outpatient Follow-up:  1. PCP in 1 week   Discharge Diagnoses:  Principal Problem:   Dehydration Active Problems:   HYPERLIPIDEMIA   Pernicious anemia   Lactic acidosis   Nausea with vomiting   Discharge Condition: improved  Diet recommendation: regular  [facility] Weights   09/06/13 2112 09/07/13 0247  Weight: 64.864 kg (143 lb) 65.772 kg (145 lb)    History of present illness:  Chief Complaint: Nausea vomiting and weakness.  HPI: Cassie White is a 73 y.o. female history of pernicious anemia, hyperlipidemia, mitral regurgitation has been experiencing nausea vomiting since night before last. Last afternoon was the last episode she had vomiting. Denies any abdominal pain diarrhea fever chills. Since patient was having multiple episodes of nausea vomiting patient has not had anything to eat. Patient which they felt weak and presented to the [place]. Over there labs reveal increased lactic acid levels. Patient did not have any abdominal pain and on exam patient's abdomen was benign.   Hospital Course:  Admitted with suspected viral gastroeneteritis, dehydration and elevated lactic acid levels. Improved with supportive care, hydration Repeat lactic acid normalized and symptoms resolved Discharged home in stable condition   Discharge Exam: Filed Vitals:   09/07/13 1445  BP: 108/65  Pulse: 83  Temp: 97.9 F (36.6 C)  Resp: 20    General: AAOx3 Cardiovascular: S1S2/RRR Respiratory: CTAB  Discharge Instructions  Discharge Orders   Future Orders Complete By Expires   Diet general  As directed    Diet general  As directed    Increase activity slowly  As directed    Increase activity slowly  As directed        Medication List         aspirin  81 MG tablet  Take 81 mg by mouth daily.     Biotin 5000 MCG Caps  Take 5,000 mcg by mouth daily.     cholecalciferol 1000 UNITS tablet  Commonly known as:  VITAMIN D  Take 1,000 Units by mouth daily.     MENEST 0.625 MG tablet  Generic drug:  esterified estrogens  Take 0.625 mg by mouth daily.     metoprolol tartrate 25 MG tablet  Commonly known as:  LOPRESSOR  Take 12.5 mg by mouth 2 (two) times daily.     nitroGLYCERIN 0.4 MG SL tablet  Commonly known as:  NITROSTAT  Place 1 tablet (0.4 mg total) under the tongue every 5 (five) minutes as needed for chest pain.     pravastatin 20 MG tablet  Commonly known as:  PRAVACHOL  Take 20 mg by mouth 3 (three) times a week. Take on Monday, Wednesday, and friday     traMADol 50 MG tablet  Commonly known as:  ULTRAM  Take 50 mg by mouth every 8 (eight) hours as needed for moderate pain.       Allergies  Allergen Reactions  . Sulfa Antibiotics Swelling    face       Follow-up Information   Follow up with Unice Cobble, MD. Schedule an appointment as soon as possible for a visit in 1 week.   Specialty:  Internal Medicine   Contact information:   [phone] [place] [address] [phone]  The results of significant diagnostics from this hospitalization (including imaging, microbiology, ancillary and laboratory) are listed below for reference.    Significant Diagnostic Studies: No results found.  Microbiology: No results found for this or any previous visit (from the past 240 hour(s)).   Labs: Basic Metabolic Panel: No results found for this basename: NA, K, CL, CO2, GLUCOSE, BUN, CREATININE, CALCIUM, MG, PHOS,  in the last 168 hours Liver Function Tests: No results found for this basename: AST, ALT, ALKPHOS, BILITOT, PROT, ALBUMIN,  in the last 168 hours No results found for this basename: LIPASE, AMYLASE,  in the last 168 hours No results found for this basename: AMMONIA,   in the last 168 hours CBC: No results found for this basename: WBC, NEUTROABS, HGB, HCT, MCV, PLT,  in the last 168 hours Cardiac Enzymes: No results found for this basename: CKTOTAL, CKMB, CKMBINDEX, TROPONINI,  in the last 168 hours BNP: BNP (last 3 results) No results found for this basename: PROBNP,  in the last 8760 hours CBG: No results found for this basename: GLUCAP,  in the last 168 hours     Signed:  Siobhan Zaro  [facility] 09/25/2013, 8:57 PM

## 2013-09-28 DIAGNOSIS — M999 Biomechanical lesion, unspecified: Secondary | ICD-10-CM | POA: Diagnosis not present

## 2013-09-28 DIAGNOSIS — M5430 Sciatica, unspecified side: Secondary | ICD-10-CM | POA: Diagnosis not present

## 2013-09-28 DIAGNOSIS — M5137 Other intervertebral disc degeneration, lumbosacral region: Secondary | ICD-10-CM | POA: Diagnosis not present

## 2013-09-28 DIAGNOSIS — IMO0002 Reserved for concepts with insufficient information to code with codable children: Secondary | ICD-10-CM | POA: Diagnosis not present

## 2013-09-30 DIAGNOSIS — M999 Biomechanical lesion, unspecified: Secondary | ICD-10-CM | POA: Diagnosis not present

## 2013-09-30 DIAGNOSIS — M5430 Sciatica, unspecified side: Secondary | ICD-10-CM | POA: Diagnosis not present

## 2013-09-30 DIAGNOSIS — M5137 Other intervertebral disc degeneration, lumbosacral region: Secondary | ICD-10-CM | POA: Diagnosis not present

## 2013-09-30 DIAGNOSIS — IMO0002 Reserved for concepts with insufficient information to code with codable children: Secondary | ICD-10-CM | POA: Diagnosis not present

## 2013-10-01 DIAGNOSIS — M5137 Other intervertebral disc degeneration, lumbosacral region: Secondary | ICD-10-CM | POA: Diagnosis not present

## 2013-10-01 DIAGNOSIS — M5430 Sciatica, unspecified side: Secondary | ICD-10-CM | POA: Diagnosis not present

## 2013-10-01 DIAGNOSIS — M999 Biomechanical lesion, unspecified: Secondary | ICD-10-CM | POA: Diagnosis not present

## 2013-10-01 DIAGNOSIS — IMO0002 Reserved for concepts with insufficient information to code with codable children: Secondary | ICD-10-CM | POA: Diagnosis not present

## 2013-10-03 ENCOUNTER — Other Ambulatory Visit: Payer: Self-pay | Admitting: Internal Medicine

## 2013-10-03 DIAGNOSIS — M999 Biomechanical lesion, unspecified: Secondary | ICD-10-CM | POA: Diagnosis not present

## 2013-10-03 DIAGNOSIS — M5430 Sciatica, unspecified side: Secondary | ICD-10-CM | POA: Diagnosis not present

## 2013-10-03 DIAGNOSIS — IMO0002 Reserved for concepts with insufficient information to code with codable children: Secondary | ICD-10-CM | POA: Diagnosis not present

## 2013-10-03 DIAGNOSIS — M5137 Other intervertebral disc degeneration, lumbosacral region: Secondary | ICD-10-CM | POA: Diagnosis not present

## 2013-10-05 DIAGNOSIS — M5137 Other intervertebral disc degeneration, lumbosacral region: Secondary | ICD-10-CM | POA: Diagnosis not present

## 2013-10-05 DIAGNOSIS — M999 Biomechanical lesion, unspecified: Secondary | ICD-10-CM | POA: Diagnosis not present

## 2013-10-05 DIAGNOSIS — M5430 Sciatica, unspecified side: Secondary | ICD-10-CM | POA: Diagnosis not present

## 2013-10-05 DIAGNOSIS — IMO0002 Reserved for concepts with insufficient information to code with codable children: Secondary | ICD-10-CM | POA: Diagnosis not present

## 2013-10-07 DIAGNOSIS — M5137 Other intervertebral disc degeneration, lumbosacral region: Secondary | ICD-10-CM | POA: Diagnosis not present

## 2013-10-07 DIAGNOSIS — M5430 Sciatica, unspecified side: Secondary | ICD-10-CM | POA: Diagnosis not present

## 2013-10-07 DIAGNOSIS — M999 Biomechanical lesion, unspecified: Secondary | ICD-10-CM | POA: Diagnosis not present

## 2013-10-07 DIAGNOSIS — M25569 Pain in unspecified knee: Secondary | ICD-10-CM | POA: Diagnosis not present

## 2013-10-07 DIAGNOSIS — M1710 Unilateral primary osteoarthritis, unspecified knee: Secondary | ICD-10-CM | POA: Diagnosis not present

## 2013-10-07 DIAGNOSIS — Z96659 Presence of unspecified artificial knee joint: Secondary | ICD-10-CM | POA: Diagnosis not present

## 2013-10-07 DIAGNOSIS — IMO0002 Reserved for concepts with insufficient information to code with codable children: Secondary | ICD-10-CM | POA: Diagnosis not present

## 2013-10-10 DIAGNOSIS — M999 Biomechanical lesion, unspecified: Secondary | ICD-10-CM | POA: Diagnosis not present

## 2013-10-10 DIAGNOSIS — M5137 Other intervertebral disc degeneration, lumbosacral region: Secondary | ICD-10-CM | POA: Diagnosis not present

## 2013-10-10 DIAGNOSIS — M5430 Sciatica, unspecified side: Secondary | ICD-10-CM | POA: Diagnosis not present

## 2013-10-10 DIAGNOSIS — IMO0002 Reserved for concepts with insufficient information to code with codable children: Secondary | ICD-10-CM | POA: Diagnosis not present

## 2013-10-11 ENCOUNTER — Ambulatory Visit: Payer: Medicare Other | Attending: Orthopedic Surgery | Admitting: Physical Therapy

## 2013-10-11 DIAGNOSIS — R5381 Other malaise: Secondary | ICD-10-CM | POA: Diagnosis not present

## 2013-10-11 DIAGNOSIS — M25569 Pain in unspecified knee: Secondary | ICD-10-CM | POA: Diagnosis not present

## 2013-10-11 DIAGNOSIS — IMO0001 Reserved for inherently not codable concepts without codable children: Secondary | ICD-10-CM | POA: Insufficient documentation

## 2013-10-13 ENCOUNTER — Ambulatory Visit: Payer: Medicare Other | Admitting: Physical Therapy

## 2013-10-13 DIAGNOSIS — IMO0001 Reserved for inherently not codable concepts without codable children: Secondary | ICD-10-CM | POA: Diagnosis not present

## 2013-10-13 DIAGNOSIS — M25569 Pain in unspecified knee: Secondary | ICD-10-CM | POA: Diagnosis not present

## 2013-10-13 DIAGNOSIS — R5381 Other malaise: Secondary | ICD-10-CM | POA: Diagnosis not present

## 2013-10-14 DIAGNOSIS — M5137 Other intervertebral disc degeneration, lumbosacral region: Secondary | ICD-10-CM | POA: Diagnosis not present

## 2013-10-14 DIAGNOSIS — M5430 Sciatica, unspecified side: Secondary | ICD-10-CM | POA: Diagnosis not present

## 2013-10-14 DIAGNOSIS — IMO0002 Reserved for concepts with insufficient information to code with codable children: Secondary | ICD-10-CM | POA: Diagnosis not present

## 2013-10-14 DIAGNOSIS — M999 Biomechanical lesion, unspecified: Secondary | ICD-10-CM | POA: Diagnosis not present

## 2013-10-17 DIAGNOSIS — M5137 Other intervertebral disc degeneration, lumbosacral region: Secondary | ICD-10-CM | POA: Diagnosis not present

## 2013-10-17 DIAGNOSIS — M999 Biomechanical lesion, unspecified: Secondary | ICD-10-CM | POA: Diagnosis not present

## 2013-10-17 DIAGNOSIS — M5430 Sciatica, unspecified side: Secondary | ICD-10-CM | POA: Diagnosis not present

## 2013-10-17 DIAGNOSIS — IMO0002 Reserved for concepts with insufficient information to code with codable children: Secondary | ICD-10-CM | POA: Diagnosis not present

## 2013-10-18 ENCOUNTER — Other Ambulatory Visit: Payer: Self-pay | Admitting: Neurology

## 2013-10-18 ENCOUNTER — Ambulatory Visit: Payer: Medicare Other | Admitting: Physical Therapy

## 2013-10-18 DIAGNOSIS — R5381 Other malaise: Secondary | ICD-10-CM | POA: Diagnosis not present

## 2013-10-18 DIAGNOSIS — M25569 Pain in unspecified knee: Secondary | ICD-10-CM | POA: Diagnosis not present

## 2013-10-18 DIAGNOSIS — IMO0001 Reserved for inherently not codable concepts without codable children: Secondary | ICD-10-CM | POA: Diagnosis not present

## 2013-10-20 ENCOUNTER — Ambulatory Visit: Payer: Medicare Other | Admitting: Physical Therapy

## 2013-10-20 DIAGNOSIS — R5381 Other malaise: Secondary | ICD-10-CM | POA: Diagnosis not present

## 2013-10-20 DIAGNOSIS — M25569 Pain in unspecified knee: Secondary | ICD-10-CM | POA: Diagnosis not present

## 2013-10-20 DIAGNOSIS — IMO0001 Reserved for inherently not codable concepts without codable children: Secondary | ICD-10-CM | POA: Diagnosis not present

## 2013-10-21 DIAGNOSIS — M999 Biomechanical lesion, unspecified: Secondary | ICD-10-CM | POA: Diagnosis not present

## 2013-10-21 DIAGNOSIS — M5430 Sciatica, unspecified side: Secondary | ICD-10-CM | POA: Diagnosis not present

## 2013-10-21 DIAGNOSIS — M5137 Other intervertebral disc degeneration, lumbosacral region: Secondary | ICD-10-CM | POA: Diagnosis not present

## 2013-10-21 DIAGNOSIS — IMO0002 Reserved for concepts with insufficient information to code with codable children: Secondary | ICD-10-CM | POA: Diagnosis not present

## 2013-10-24 DIAGNOSIS — M5137 Other intervertebral disc degeneration, lumbosacral region: Secondary | ICD-10-CM | POA: Diagnosis not present

## 2013-10-24 DIAGNOSIS — M999 Biomechanical lesion, unspecified: Secondary | ICD-10-CM | POA: Diagnosis not present

## 2013-10-24 DIAGNOSIS — M5430 Sciatica, unspecified side: Secondary | ICD-10-CM | POA: Diagnosis not present

## 2013-10-24 DIAGNOSIS — IMO0002 Reserved for concepts with insufficient information to code with codable children: Secondary | ICD-10-CM | POA: Diagnosis not present

## 2013-10-24 DIAGNOSIS — H25049 Posterior subcapsular polar age-related cataract, unspecified eye: Secondary | ICD-10-CM | POA: Diagnosis not present

## 2013-10-25 DIAGNOSIS — M79609 Pain in unspecified limb: Secondary | ICD-10-CM | POA: Diagnosis not present

## 2013-10-25 DIAGNOSIS — M722 Plantar fascial fibromatosis: Secondary | ICD-10-CM | POA: Diagnosis not present

## 2013-10-25 DIAGNOSIS — M2040 Other hammer toe(s) (acquired), unspecified foot: Secondary | ICD-10-CM | POA: Diagnosis not present

## 2013-10-28 DIAGNOSIS — IMO0002 Reserved for concepts with insufficient information to code with codable children: Secondary | ICD-10-CM | POA: Diagnosis not present

## 2013-10-28 DIAGNOSIS — M999 Biomechanical lesion, unspecified: Secondary | ICD-10-CM | POA: Diagnosis not present

## 2013-10-28 DIAGNOSIS — M5137 Other intervertebral disc degeneration, lumbosacral region: Secondary | ICD-10-CM | POA: Diagnosis not present

## 2013-10-28 DIAGNOSIS — M5430 Sciatica, unspecified side: Secondary | ICD-10-CM | POA: Diagnosis not present

## 2013-11-02 DIAGNOSIS — IMO0002 Reserved for concepts with insufficient information to code with codable children: Secondary | ICD-10-CM | POA: Diagnosis not present

## 2013-11-02 DIAGNOSIS — M5137 Other intervertebral disc degeneration, lumbosacral region: Secondary | ICD-10-CM | POA: Diagnosis not present

## 2013-11-02 DIAGNOSIS — M999 Biomechanical lesion, unspecified: Secondary | ICD-10-CM | POA: Diagnosis not present

## 2013-11-02 DIAGNOSIS — M5430 Sciatica, unspecified side: Secondary | ICD-10-CM | POA: Diagnosis not present

## 2013-11-03 ENCOUNTER — Other Ambulatory Visit: Payer: Self-pay | Admitting: Neurology

## 2013-11-15 DIAGNOSIS — M2040 Other hammer toe(s) (acquired), unspecified foot: Secondary | ICD-10-CM | POA: Diagnosis not present

## 2013-11-15 DIAGNOSIS — M79609 Pain in unspecified limb: Secondary | ICD-10-CM | POA: Diagnosis not present

## 2013-11-19 ENCOUNTER — Other Ambulatory Visit: Payer: Self-pay | Admitting: Neurology

## 2013-11-19 DIAGNOSIS — J209 Acute bronchitis, unspecified: Secondary | ICD-10-CM | POA: Diagnosis not present

## 2013-11-19 DIAGNOSIS — J019 Acute sinusitis, unspecified: Secondary | ICD-10-CM | POA: Diagnosis not present

## 2013-11-23 DIAGNOSIS — M1710 Unilateral primary osteoarthritis, unspecified knee: Secondary | ICD-10-CM | POA: Diagnosis not present

## 2013-11-23 DIAGNOSIS — M25569 Pain in unspecified knee: Secondary | ICD-10-CM | POA: Diagnosis not present

## 2013-11-23 DIAGNOSIS — M79609 Pain in unspecified limb: Secondary | ICD-10-CM | POA: Diagnosis not present

## 2013-11-23 DIAGNOSIS — M5137 Other intervertebral disc degeneration, lumbosacral region: Secondary | ICD-10-CM | POA: Diagnosis not present

## 2013-11-24 DIAGNOSIS — M2040 Other hammer toe(s) (acquired), unspecified foot: Secondary | ICD-10-CM | POA: Diagnosis not present

## 2013-11-24 DIAGNOSIS — Z853 Personal history of malignant neoplasm of breast: Secondary | ICD-10-CM | POA: Diagnosis not present

## 2013-11-24 DIAGNOSIS — G589 Mononeuropathy, unspecified: Secondary | ICD-10-CM | POA: Diagnosis not present

## 2013-11-24 DIAGNOSIS — M810 Age-related osteoporosis without current pathological fracture: Secondary | ICD-10-CM | POA: Diagnosis not present

## 2013-11-24 DIAGNOSIS — M4120 Other idiopathic scoliosis, site unspecified: Secondary | ICD-10-CM | POA: Diagnosis not present

## 2013-11-24 DIAGNOSIS — M129 Arthropathy, unspecified: Secondary | ICD-10-CM | POA: Diagnosis not present

## 2013-11-24 DIAGNOSIS — Z79899 Other long term (current) drug therapy: Secondary | ICD-10-CM | POA: Diagnosis not present

## 2013-11-24 DIAGNOSIS — Z882 Allergy status to sulfonamides status: Secondary | ICD-10-CM | POA: Diagnosis not present

## 2013-11-24 DIAGNOSIS — M205X9 Other deformities of toe(s) (acquired), unspecified foot: Secondary | ICD-10-CM | POA: Diagnosis not present

## 2013-11-26 ENCOUNTER — Other Ambulatory Visit: Payer: Self-pay | Admitting: Neurology

## 2013-11-28 NOTE — Telephone Encounter (Signed)
I spoke with the patient, and she said she will call back to schedule appt

## 2013-11-29 NOTE — Telephone Encounter (Signed)
Pt called for refill on her gabapentin (NEURONTIN) 300 MG capsule pt did set up an apt to see Dr. Brett Fairy. Please call pt when called in. Thanks

## 2013-12-06 DIAGNOSIS — M2040 Other hammer toe(s) (acquired), unspecified foot: Secondary | ICD-10-CM | POA: Diagnosis not present

## 2013-12-12 ENCOUNTER — Other Ambulatory Visit: Payer: Self-pay | Admitting: Internal Medicine

## 2014-01-03 DIAGNOSIS — M2040 Other hammer toe(s) (acquired), unspecified foot: Secondary | ICD-10-CM | POA: Diagnosis not present

## 2014-01-30 DIAGNOSIS — J029 Acute pharyngitis, unspecified: Secondary | ICD-10-CM | POA: Diagnosis not present

## 2014-03-02 DIAGNOSIS — M79609 Pain in unspecified limb: Secondary | ICD-10-CM | POA: Diagnosis not present

## 2014-03-02 DIAGNOSIS — M4180 Other forms of scoliosis, site unspecified: Secondary | ICD-10-CM | POA: Diagnosis not present

## 2014-03-02 DIAGNOSIS — M5137 Other intervertebral disc degeneration, lumbosacral region: Secondary | ICD-10-CM | POA: Diagnosis not present

## 2014-03-09 ENCOUNTER — Telehealth: Payer: Self-pay | Admitting: *Deleted

## 2014-03-09 NOTE — Telephone Encounter (Signed)
Spoke with patient to see if she wanted a sooner appointment, patient refused, name will be removed from wait list. Patient's next appointment is for 06/13/14 at 2 pm with Dr. Brett Fairy.

## 2014-04-26 DIAGNOSIS — M5137 Other intervertebral disc degeneration, lumbosacral region: Secondary | ICD-10-CM | POA: Diagnosis not present

## 2014-04-26 DIAGNOSIS — M4180 Other forms of scoliosis, site unspecified: Secondary | ICD-10-CM | POA: Diagnosis not present

## 2014-04-28 ENCOUNTER — Encounter: Payer: Self-pay | Admitting: Internal Medicine

## 2014-04-28 ENCOUNTER — Other Ambulatory Visit: Payer: Self-pay | Admitting: Internal Medicine

## 2014-04-28 ENCOUNTER — Ambulatory Visit (INDEPENDENT_AMBULATORY_CARE_PROVIDER_SITE_OTHER): Payer: Medicare Other | Admitting: Internal Medicine

## 2014-04-28 ENCOUNTER — Other Ambulatory Visit (INDEPENDENT_AMBULATORY_CARE_PROVIDER_SITE_OTHER): Payer: Medicare Other

## 2014-04-28 VITALS — BP 140/82 | HR 57 | Temp 97.8°F | Resp 13 | Ht 63.0 in | Wt 140.4 lb

## 2014-04-28 DIAGNOSIS — E785 Hyperlipidemia, unspecified: Secondary | ICD-10-CM | POA: Diagnosis not present

## 2014-04-28 DIAGNOSIS — D510 Vitamin B12 deficiency anemia due to intrinsic factor deficiency: Secondary | ICD-10-CM | POA: Diagnosis not present

## 2014-04-28 DIAGNOSIS — I10 Essential (primary) hypertension: Secondary | ICD-10-CM

## 2014-04-28 DIAGNOSIS — Z23 Encounter for immunization: Secondary | ICD-10-CM

## 2014-04-28 DIAGNOSIS — M810 Age-related osteoporosis without current pathological fracture: Secondary | ICD-10-CM | POA: Diagnosis not present

## 2014-04-28 LAB — BASIC METABOLIC PANEL
BUN: 10 mg/dL (ref 6–23)
CHLORIDE: 105 meq/L (ref 96–112)
CO2: 28 meq/L (ref 19–32)
Calcium: 10.1 mg/dL (ref 8.4–10.5)
Creatinine, Ser: 0.8 mg/dL (ref 0.4–1.2)
GFR: 78.09 mL/min (ref >60.00)
Glucose, Bld: 89 mg/dL (ref 70–99)
POTASSIUM: 3.8 meq/L (ref 3.5–5.1)
SODIUM: 139 meq/L (ref 135–145)

## 2014-04-28 LAB — HEPATIC FUNCTION PANEL
ALT: 33 U/L (ref 0–35)
AST: 31 U/L (ref 0–37)
Albumin: 4.3 g/dL (ref 3.5–5.2)
Alkaline Phosphatase: 52 U/L (ref 39–117)
BILIRUBIN TOTAL: 0.7 mg/dL (ref 0.2–1.2)
Bilirubin, Direct: 0.1 mg/dL (ref 0.0–0.3)
Total Protein: 7.2 g/dL (ref 6.0–8.3)

## 2014-04-28 LAB — CBC WITH DIFFERENTIAL/PLATELET
BASOS PCT: 0.3 % (ref 0.0–3.0)
Basophils Absolute: 0 10*3/uL (ref 0.0–0.1)
Eosinophils Absolute: 0.1 10*3/uL (ref 0.0–0.7)
Eosinophils Relative: 0.7 % (ref 0.0–5.0)
HEMATOCRIT: 40.4 % (ref 36.0–46.0)
HEMOGLOBIN: 13.7 g/dL (ref 12.0–15.0)
LYMPHS PCT: 39.4 % (ref 12.0–46.0)
Lymphs Abs: 2.9 10*3/uL (ref 0.7–4.0)
MCHC: 33.9 g/dL (ref 30.0–36.0)
MCV: 89.3 fl (ref 78.0–100.0)
Monocytes Absolute: 0.9 10*3/uL (ref 0.1–1.0)
Monocytes Relative: 11.8 % (ref 3.0–12.0)
NEUTROS ABS: 3.5 10*3/uL (ref 1.4–7.7)
Neutrophils Relative %: 47.8 % (ref 43.0–77.0)
Platelets: 174 10*3/uL (ref 150.0–400.0)
RBC: 4.52 Mil/uL (ref 3.87–5.11)
RDW: 12.9 % (ref 11.5–15.5)
WBC: 7.3 10*3/uL (ref 4.0–10.5)

## 2014-04-28 LAB — VITAMIN D 25 HYDROXY (VIT D DEFICIENCY, FRACTURES): VITD: 80.04 ng/mL (ref 30.00–100.00)

## 2014-04-28 LAB — TSH: TSH: 0.31 u[IU]/mL — ABNORMAL LOW (ref 0.35–4.50)

## 2014-04-28 LAB — VITAMIN B12: Vitamin B-12: 1250 pg/mL — ABNORMAL HIGH (ref 211–911)

## 2014-04-28 NOTE — Assessment & Plan Note (Signed)
Lipoprofile, LFTs, TSH 

## 2014-04-28 NOTE — Progress Notes (Signed)
Subjective:    Patient ID: Cassie White, female    DOB: 10-12-1940, 73 y.o.   MRN: [id number]  HPI She is here to assess active health issues & conditions. PMH, FH, & Social history verified & updated    She is compliant with her antihypertensive and statin medications. She has no adverse effects. She is on prednisone for her scoliosis related back pain; this is causing insomnia  She is on a heart healthy diet. She is on no regular exercise program but engages in yard work  She is not checking blood pressures at home.  The family history does include premature myocardial infarction in her son who smokes.    Review of Systems Occasional positional hand cramping. She does have occasional palpitations. There's burning in feet related to her peripheral neuropathy.  All below NEGATIVE: Chest pain    Dyspnea Edema Claudication  Lightheadedness,Syncope Weight change Polyuria/phagia/dipsia    Blurred vision /diplopia/lossof vision Non healing skin lesions Abd pain, bowel changes   Myalgias      Objective:   Physical Exam   Pertinent or positive findings include: The nasal septum is deviated to the right. She has some wax in otic canals without impactions. There is asymmetry of the posterior thoracic musculature indicative of scoliosis. The right knee reflex is 1/2+; the left is 1+.  Gen.: Healthy and well-nourished in appearance. Alert, appropriate and cooperative throughout exam. Appears younger than stated age  Head: Normocephalic without obvious abnormalities  Eyes: No corneal or conjunctival inflammation noted. Pupils equal round reactive to light and accommodation. Extraocular motion intact. Ears: External  ear exam reveals no significant lesions or deformities. Hearing is grossly normal bilaterally. Nose: External nasal exam reveals no deformity or inflammation. Nasal mucosa are pink and moist. No lesions or exudates noted.   Mouth: Oral mucosa and oropharynx  reveal no lesions or exudates. Teeth in good repair. Neck: No deformities, masses, or tenderness noted. Range of motion  & Thyroid  normal. Lungs: Normal respiratory effort; chest expands symmetrically. Lungs are clear to auscultation without rales, wheezes, or increased work of breathing. Heart: Normal rate and rhythm. Normal S1 and S2. No gallop, click, or rub. No murmur. Abdomen: Bowel sounds normal; abdomen soft and nontender. No masses, organomegaly or hernias noted. Genitalia:  as per Gyn                                  Musculoskeletal/extremities:  No clubbing, cyanosis, edema, or significant extremity  deformity noted. Range of motion normal .Tone & strength normal. Hand joints normal Fingernail  health good. Able to lie down & sit up w/o help. Negative SLR bilaterally Vascular: Carotid, radial artery, dorsalis pedis and  posterior tibial pulses are full and equal. No bruits present. Neurologic: Alert and oriented x3.  Gait normal  .   Skin: Intact without suspicious lesions or rashes. Lymph: No cervical, axillary lymphadenopathy present. Psych: Mood and affect are normal. Normally interactive  Assessment & Plan:  See Current Assessment & Plan in Problem List under specific Diagnosis and and a is in the and the

## 2014-04-28 NOTE — Assessment & Plan Note (Signed)
CBC & B12 level 

## 2014-04-28 NOTE — Progress Notes (Signed)
Pre visit review using our clinic review tool, if applicable. No additional management support is needed unless otherwise documented below in the visit note. 

## 2014-04-28 NOTE — Assessment & Plan Note (Signed)
DEXA Vitamin D level

## 2014-04-28 NOTE — Patient Instructions (Addendum)
Minimal Blood Pressure Goal= AVERAGE < 140/90;  Ideal is an AVERAGE < 135/85. This AVERAGE should be calculated from @ least 5-7 BP readings taken @ different times of day on different days of week. You should not respond to isolated BP readings , but rather the AVERAGE for that week .Please bring your  blood pressure cuff to office visits to verify that it is reliable.It  can also be checked against the blood pressure device at the pharmacy. Finger or wrist cuffs are not dependable; an arm cuff is.   Plain Mucinex (NOT D) for thick secretions ;force NON dairy fluids .   Nasal cleansing in the shower as discussed with lather of mild shampoo.After 10 seconds wash off lather while  exhaling through nostrils. Make sure that all residual soap is removed to prevent irritation.  Flonase OR Nasacort AQ 1 spray in each nostril twice a day as needed. Use the "crossover" technique into opposite nostril spraying toward opposite ear @ 45 degree angle, not straight up into nostril.  Use a Neti pot daily only  as needed for significant sinus congestion; going from open side to congested side . Plain Allegra (NOT D )  160 daily , Loratidine 10 mg , OR Zyrtec 10 mg @ bedtime  as needed for itchy eyes & sneezing.  Your next office appointment will be determined based upon review of your pending labs & BMD. Those instructions will be transmitted to you  by mail.

## 2014-04-28 NOTE — Assessment & Plan Note (Signed)
Blood pressure goals reviewed. BMET 

## 2014-04-30 ENCOUNTER — Other Ambulatory Visit: Payer: Self-pay | Admitting: Internal Medicine

## 2014-04-30 DIAGNOSIS — R946 Abnormal results of thyroid function studies: Secondary | ICD-10-CM

## 2014-05-01 ENCOUNTER — Encounter: Payer: Self-pay | Admitting: Internal Medicine

## 2014-05-01 LAB — NMR LIPOPROFILE WITH LIPIDS
Cholesterol, Total: 211 mg/dL — ABNORMAL HIGH (ref 100–199)
HDL Particle Number: 44.6 umol/L (ref >=30.5)
HDL Size: 9.9 nm (ref >=9.2)
HDL-C: 101 mg/dL (ref >39)
LARGE HDL: 15.9 umol/L (ref >=4.8)
LDL (calc): 91 mg/dL (ref 0–99)
LDL Particle Number: 1022 nmol/L — ABNORMAL HIGH (ref <1000)
LDL Size: 21.8 nm (ref >=20.8)
LP-IR Score: <25 (ref <=45)
Large VLDL-P: 1.6 nmol/L (ref <=2.7)
Small LDL Particle Number: <90 nmol/L (ref <=527)
TRIGLYCERIDES: 95 mg/dL (ref 0–149)
VLDL Size: 42 nm (ref <=46.6)

## 2014-05-11 ENCOUNTER — Telehealth: Payer: Self-pay | Admitting: *Deleted

## 2014-05-11 NOTE — Telephone Encounter (Signed)
   A low TSH is suggestive of an overactive thyroid. We need the TSH to be repeated along with a free T4 and free T3 to evaluate this adequately. Those orders have been entered; she simply needs to come to the lab

## 2014-05-11 NOTE — Telephone Encounter (Signed)
Left msg on triage stating received lab results and her TSH low. She is thinking that she need medicine for her thyroid. Pt states her hair is coming out, and she think its because of her thyroid...Cassie White

## 2014-05-11 NOTE — Telephone Encounter (Signed)
Notified pt with md response.../lmb 

## 2014-05-12 ENCOUNTER — Other Ambulatory Visit (INDEPENDENT_AMBULATORY_CARE_PROVIDER_SITE_OTHER): Payer: Medicare Other

## 2014-05-12 ENCOUNTER — Ambulatory Visit (INDEPENDENT_AMBULATORY_CARE_PROVIDER_SITE_OTHER): Payer: Medicare Other | Admitting: Cardiology

## 2014-05-12 ENCOUNTER — Encounter: Payer: Self-pay | Admitting: Cardiology

## 2014-05-12 VITALS — BP 136/70 | HR 82 | Ht 63.0 in | Wt 143.2 lb

## 2014-05-12 DIAGNOSIS — I10 Essential (primary) hypertension: Secondary | ICD-10-CM

## 2014-05-12 DIAGNOSIS — I340 Nonrheumatic mitral (valve) insufficiency: Secondary | ICD-10-CM

## 2014-05-12 DIAGNOSIS — R946 Abnormal results of thyroid function studies: Secondary | ICD-10-CM | POA: Diagnosis not present

## 2014-05-12 DIAGNOSIS — I341 Nonrheumatic mitral (valve) prolapse: Secondary | ICD-10-CM

## 2014-05-12 LAB — TSH: TSH: 1.41 u[IU]/mL (ref 0.35–4.50)

## 2014-05-12 LAB — T3, FREE: T3, Free: 2.6 pg/mL (ref 2.3–4.2)

## 2014-05-12 LAB — T4, FREE: FREE T4: 1.04 ng/dL (ref 0.60–1.60)

## 2014-05-12 NOTE — Patient Instructions (Signed)
Continue your current therapy  I will see you in one year   

## 2014-05-12 NOTE — Progress Notes (Signed)
Cassie White Date of Birth: 1940-10-18 Medical Record #[id number]  History of Present Illness: Cassie White is seen for follow up. She  has a history of hyperlipidemia. She also has a history of mitral valve prolapse. She has moderate MR by Echo in 2/12 and 2/14 but exam has been unremarkable. She was experiencing some chest tightness last year.  She was started on metoprolol empirically. She states this has helped. PFTs were normal. Now she only gets tightness if she is in a real hurry or over doing it.   Current Outpatient Prescriptions on File Prior to Visit  Medication Sig Dispense Refill  . aspirin 81 MG tablet Take 81 mg by mouth daily.        . B Complex-C (B-COMPLEX WITH VITAMIN C) tablet Take 1 tablet by mouth daily.      . Biotin 5000 MCG CAPS Take 5,000 mcg by mouth daily.      . Calcium Carbonate (CALCIUM 600 PO) Take 600 mg by mouth 2 (two) times daily.       . cholecalciferol (VITAMIN D) 1000 UNITS tablet Take 5,000 Units by mouth daily.       Marland Kitchen gabapentin (NEURONTIN) 300 MG capsule TAKE 1 CAPSULE BY MOUTH AT LUNCH, 1 AT DINNER, AND 2 AT BEDTIME. NEEDS TO BE SEEN  120 capsule  6  . metoprolol tartrate (LOPRESSOR) 25 MG tablet Take 12.5 mg by mouth 2 (two) times daily.      . nitroGLYCERIN (NITROSTAT) 0.4 MG SL tablet Place 1 tablet (0.4 mg total) under the tongue every 5 (five) minutes as needed for chest pain.  25 tablet  5  . Omega-3 Fatty Acids (FISH OIL) 1200 MG CAPS Take 1,000 mg by mouth 2 (two) times daily.       . pravastatin (PRAVACHOL) 20 MG tablet Take 20 mg by mouth 3 (three) times a week. Take on Monday, Wednesday, and friday      . predniSONE (DELTASONE) 50 MG tablet 50 mg. 1 tablet daily for 1 week       No current facility-administered medications on file prior to visit.    Allergies  Allergen Reactions  . Sulfa Antibiotics Swelling    face    Past Medical History  Diagnosis Date  . Arthritis     DJD  . Hyperlipidemia   . GERD (gastroesophageal  reflux disease)   . Osteoporosis, senile   . Diverticulosis   . Scoliosis   . Peripheral neuropathy   . Hx of cardiovascular stress test     Lexiscan Myoview (2/14):  Low risk, small ant defect likely breast attenuation, no ischemia, EF 69% (no change from 2010).    . Mitral regurgitation     Echo (2/14):  EF 60-65%, Gr 1 DD, mild AI, mild bileaflet MVP, mod post directed MR, mild LAE, PASP 35.  Marland Kitchen MVP (mitral valve prolapse)     Past Surgical History  Procedure Laterality Date  . Tonsillectomy    . Vaginal hysterectomy      For Uterine Deviation   . 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 Cholecystectomy  03/2010    Dr.Gross  . Lumpectomy  06/2009    "Fatty Necrosis"  . Breast surgery  Nov 2010    Benign biopsy, right  . Cystocele repair    . Combined hysterectomy vaginal / oophorectomy / a&p repair  7353    uncertain if ovaries were removed or not  History  Smoking status  . Never Smoker   Smokeless tobacco  . Not on file    History  Alcohol Use No    Family History  Problem Relation Age of Onset  . Heart attack Father     in 59s  . Stroke Father 41  . Ovarian cancer Mother   . Coronary artery disease Brother   . Heart attack Son 84    Smoker  . Diabetes Neg Hx     Review of Systems: The review of systems is positive for peripheral neuropathy. This limits her ability to to walk.  All other systems were reviewed and are negative.  Physical Exam: BP 136/70  Pulse 82  Ht 5\' 3"  (1.6 m)  Wt 143 lb 3 oz (64.949 kg)  BMI 25.37 kg/m2 She is a pleasant white female in no acute distress.  The HEENT exam is normal.  The carotids are 2+ without bruits.  There is no thyromegaly.  There is no JVD.  The lungs are clear.   The heart exam reveals a regular rate with a normal S1 and S2.  There is a soft grade 1/6 systolic murmur heard best at the apex..  The PMI is not displaced.   Abdominal exam reveals good bowel sounds.   There is no hepatosplenomegaly  or tenderness.  Exam of the legs reveal no clubbing, cyanosis, or edema.   The distal pulses are intact.  Cranial nerves II - XII are intact.  Motor and sensory functions are intact.  The gait is normal.  LABORATORY DATA:   Assessment / Plan: 1. Exertional chest tightness.  Improved with low dose metoprolol. Continue current therapy and follow up in one year.  2. Mitral valve prolapse with moderate MR. Last Echo 2/14. Exam is stable. Will follow up Echo every 3-4 years unless there is a clinical change.  3. Hyperlipidemia.

## 2014-05-16 ENCOUNTER — Telehealth: Payer: Self-pay | Admitting: *Deleted

## 2014-05-16 NOTE — Telephone Encounter (Signed)
Left msg on triage stating had labs done for her thyroid. MD was going to rx something for her thyroid. Called pt back no answer LMOM md has mailed out lab letter thyroid testing was normal so he stated did not need thyroid replacement...Cassie White

## 2014-05-19 ENCOUNTER — Ambulatory Visit (HOSPITAL_COMMUNITY): Admission: RE | Admit: 2014-05-19 | Payer: Medicare Other | Source: Ambulatory Visit

## 2014-05-25 ENCOUNTER — Ambulatory Visit (HOSPITAL_COMMUNITY)
Admission: RE | Admit: 2014-05-25 | Discharge: 2014-05-25 | Disposition: A | Discharge status: Home / Self Care | Payer: Medicare Other | Source: Ambulatory Visit | Attending: Internal Medicine | Admitting: Internal Medicine

## 2014-05-25 DIAGNOSIS — Z780 Asymptomatic menopausal state: Secondary | ICD-10-CM | POA: Insufficient documentation

## 2014-05-25 DIAGNOSIS — Z13820 Encounter for screening for osteoporosis: Secondary | ICD-10-CM | POA: Insufficient documentation

## 2014-05-25 DIAGNOSIS — M810 Age-related osteoporosis without current pathological fracture: Secondary | ICD-10-CM | POA: Diagnosis not present

## 2014-05-28 ENCOUNTER — Encounter: Payer: Self-pay | Admitting: Internal Medicine

## 2014-05-29 ENCOUNTER — Telehealth: Payer: Self-pay

## 2014-05-29 NOTE — Telephone Encounter (Signed)
Patient has been notified

## 2014-05-29 NOTE — Telephone Encounter (Signed)
Message copied by Shelly Coss on Mon May 29, 2014  8:23 AM ------      Message from: Hendricks Limes      Created: Sun May 28, 2014  4:46 PM       BMD unchanged vs 2014; repeat in 2017 ------

## 2014-06-13 ENCOUNTER — Ambulatory Visit: Payer: Self-pay | Admitting: Neurology

## 2014-07-05 DIAGNOSIS — S46812A Strain of other muscles, fascia and tendons at shoulder and upper arm level, left arm, initial encounter: Secondary | ICD-10-CM | POA: Diagnosis not present

## 2014-07-06 ENCOUNTER — Encounter: Payer: Self-pay | Admitting: Internal Medicine

## 2014-07-12 DIAGNOSIS — M25512 Pain in left shoulder: Secondary | ICD-10-CM | POA: Diagnosis not present

## 2014-07-14 ENCOUNTER — Ambulatory Visit: Payer: Medicare Other | Admitting: Internal Medicine

## 2014-07-25 DIAGNOSIS — M25512 Pain in left shoulder: Secondary | ICD-10-CM | POA: Diagnosis not present

## 2014-08-01 DIAGNOSIS — M19012 Primary osteoarthritis, left shoulder: Secondary | ICD-10-CM | POA: Diagnosis not present

## 2014-08-01 DIAGNOSIS — M25512 Pain in left shoulder: Secondary | ICD-10-CM | POA: Diagnosis not present

## 2014-08-01 DIAGNOSIS — S43432D Superior glenoid labrum lesion of left shoulder, subsequent encounter: Secondary | ICD-10-CM | POA: Diagnosis not present

## 2014-08-06 ENCOUNTER — Other Ambulatory Visit: Payer: Self-pay | Admitting: Neurology

## 2014-08-07 ENCOUNTER — Encounter: Payer: Self-pay | Admitting: Internal Medicine

## 2014-08-07 ENCOUNTER — Ambulatory Visit (INDEPENDENT_AMBULATORY_CARE_PROVIDER_SITE_OTHER): Payer: Medicare Other | Admitting: Internal Medicine

## 2014-08-07 ENCOUNTER — Other Ambulatory Visit: Payer: Self-pay | Admitting: Neurology

## 2014-08-07 VITALS — BP 142/82 | HR 81 | Temp 97.8°F | Resp 15 | Ht 63.0 in | Wt 142.5 lb

## 2014-08-07 DIAGNOSIS — M810 Age-related osteoporosis without current pathological fracture: Secondary | ICD-10-CM | POA: Diagnosis not present

## 2014-08-07 NOTE — Patient Instructions (Signed)
Recommended lifestyle interventions to prevent Osteoporosis progression  include calcium 600 mg twice a day  & vitamin D3 supplementation to keep vit D  level @ least 40-60. The usual vitamin D3 dose is 1000 IU daily; but individual dose is determined by annual vitamin D level monitor. Also weight bearing exercise such as  walking 30-45 minutes 3-4  X per week is recommended.

## 2014-08-07 NOTE — Telephone Encounter (Signed)
Patient has appt

## 2014-08-07 NOTE — Progress Notes (Signed)
Pre visit review using our clinic review tool, if applicable. No additional management support is needed unless otherwise documented below in the visit note. 

## 2014-08-07 NOTE — Progress Notes (Signed)
   Subjective:    Patient ID: Cassie White, female    DOB: Feb 19, 1941, 74 y.o.   MRN: [id number]  HPI  She is here to review the bone densities. There was progressive loss 2011-2014. From February 2014-October 2015 there was essentially no change.  She does have significant osteoporosis with T score of -2.7 @ the left femoral neck. She's taken Fosamax for 10 years. She's had 1 Prolene injection   She does have a history of fracture of the wrist as a child. There is no family history of osteoporosis.  She has a history of GERD without history of stricture.  Vitamin D level was 80.04 in 9/15 on 5000 IU vit D3 daily.   Review of Systems She has been treated by Dr. Alma Friendly for rotator cuff tear. She's taken oral steroids for 1 week as well as having a steroid injection in the shoulder 1.   Her TSH had been 0.31; on recheck it was 1.41.    Objective:   Physical Exam  Positive or pertinent findings include: There is asymmetry of the lumbosacral musculature. These are more prominent on the right than the left suggesting occult scoliosis She does have crepitus of the knees without associated effusion There is decrease elevation of the upper extremities above the shoulders.  General appearance :adequately nourished; in no distress. Appears younger than stated age Eyes: No conjunctival inflammation or scleral icterus is present.Slight ptosis Heart:  Normal rate and regular rhythm. S1 and S2 normal without gallop, murmur, click, rub or other extra sounds   Lungs:Chest clear to auscultation; no wheezes, rhonchi,rales ,or rubs present.No increased work of breathing.  Abdomen: bowel sounds normal, soft and non-tender without masses, organomegaly or hernias noted.  No guarding or rebound.  Vascular : all pulses equal ; no bruits present. Skin:Warm & dry.  Intact without suspicious lesions or rashes ; no jaundice or tenting Lymphatic: No lymphadenopathy is noted about the head, neck,  axilla         Assessment & Plan:  #1 osteoporosis, stable at the left hip 2014-2015.  Plan: Monitor the bone density @ [facility] every 25 months. If there is progression of osteoporosis or if Dr Veverly Fells feels long term steroids indicated; reevaluate Prolia or other parenteral agent  A copy this will be sent to Dr. Alma Friendly

## 2014-08-21 ENCOUNTER — Other Ambulatory Visit (HOSPITAL_COMMUNITY): Payer: Self-pay | Admitting: Obstetrics and Gynecology

## 2014-08-21 DIAGNOSIS — Z1231 Encounter for screening mammogram for malignant neoplasm of breast: Secondary | ICD-10-CM

## 2014-08-22 ENCOUNTER — Telehealth: Payer: Self-pay | Admitting: Internal Medicine

## 2014-08-22 ENCOUNTER — Ambulatory Visit (HOSPITAL_COMMUNITY)
Admission: RE | Admit: 2014-08-22 | Discharge: 2014-08-22 | Disposition: A | Discharge status: Home / Self Care | Payer: Medicare Other | Source: Ambulatory Visit | Attending: Obstetrics and Gynecology | Admitting: Obstetrics and Gynecology

## 2014-08-22 ENCOUNTER — Telehealth: Payer: Self-pay

## 2014-08-22 DIAGNOSIS — R928 Other abnormal and inconclusive findings on diagnostic imaging of breast: Secondary | ICD-10-CM | POA: Diagnosis not present

## 2014-08-22 DIAGNOSIS — Z1231 Encounter for screening mammogram for malignant neoplasm of breast: Secondary | ICD-10-CM | POA: Diagnosis not present

## 2014-08-22 NOTE — Telephone Encounter (Signed)
-----   Message from Hendricks Limes, MD sent at 08/22/2014  4:13 PM EST ----- Please copy pre op clearance & FAX to Dr Veverly Fells. Thanks, [employer]

## 2014-08-22 NOTE — Telephone Encounter (Signed)
   She was seen 08/07/13 to review her bone densities.  In reference to clearance for the surgery; I have not seen the form.  She has been evaluated by her Cardiologist in October 2015 for mitral regurgitation. He has requested a follow-up in 1 year.  Unless she has had new symptoms develop in the interim; there would be no medical contraindication to the rotator cuff surgery.  Please print this note and fax it to the Orthopedist to serve his clearance for the surgery.

## 2014-08-22 NOTE — Telephone Encounter (Signed)
08/07/14 office note faxed to Dr Veverly Fells

## 2014-08-22 NOTE — Telephone Encounter (Signed)
Do you have the form? Maybe this is one I have already faxed if so it's down in medical records to be scanned

## 2014-08-22 NOTE — Telephone Encounter (Signed)
Pt needs clearance for her rotator cuff surgery, pt seen 1/4. Pls send to Dr Veverly Fells at [facility].

## 2014-08-24 ENCOUNTER — Other Ambulatory Visit: Payer: Self-pay | Admitting: Obstetrics and Gynecology

## 2014-08-24 ENCOUNTER — Telehealth: Payer: Self-pay | Admitting: Internal Medicine

## 2014-08-24 DIAGNOSIS — R928 Other abnormal and inconclusive findings on diagnostic imaging of breast: Secondary | ICD-10-CM

## 2014-08-24 NOTE — Telephone Encounter (Signed)
On 08/22/14 I faxed 08/07/14 office note to Dr Gilberto Better office

## 2014-08-24 NOTE — Telephone Encounter (Signed)
Pt called stated Dr. Veverly Fells do not have the medical clearance that we faxed in. Please check

## 2014-08-29 NOTE — Telephone Encounter (Signed)
Pt called in again about Clearance?  Does she need appt if she was seen on 08/07/2014?

## 2014-08-29 NOTE — Telephone Encounter (Signed)
No appt needed. Again 08/07/14 office note has been faxed to Dr Norris's office serving as her surgical clearance per Dr Linna Darner

## 2014-09-01 ENCOUNTER — Other Ambulatory Visit: Payer: Self-pay | Admitting: Neurology

## 2014-09-01 NOTE — Telephone Encounter (Signed)
Patient has appt scheduled

## 2014-09-04 ENCOUNTER — Ambulatory Visit
Admission: RE | Admit: 2014-09-04 | Discharge: 2014-09-04 | Disposition: A | Discharge status: Home / Self Care | Payer: Medicare Other | Source: Ambulatory Visit | Attending: Obstetrics and Gynecology | Admitting: Obstetrics and Gynecology

## 2014-09-04 DIAGNOSIS — R928 Other abnormal and inconclusive findings on diagnostic imaging of breast: Secondary | ICD-10-CM

## 2014-09-04 DIAGNOSIS — N63 Unspecified lump in breast: Secondary | ICD-10-CM | POA: Diagnosis not present

## 2014-09-04 DIAGNOSIS — N6002 Solitary cyst of left breast: Secondary | ICD-10-CM | POA: Diagnosis not present

## 2014-09-06 ENCOUNTER — Telehealth: Payer: Self-pay | Admitting: Internal Medicine

## 2014-09-06 NOTE — Telephone Encounter (Signed)
Danielle with [facility] looking for a Surgical clearance form that they she said she has faxed over 3 times.  Have you seen it by chance?       [phone] Dr Veverly Fells office

## 2014-09-07 DIAGNOSIS — J019 Acute sinusitis, unspecified: Secondary | ICD-10-CM | POA: Diagnosis not present

## 2014-09-07 NOTE — Telephone Encounter (Signed)
Phone call to [facility]. She had the wrong fax #. I gave her the correct # to fax the surgical clearance form.

## 2014-09-07 NOTE — Telephone Encounter (Signed)
Surgical clearance form has been faxed back to [phone] / [facility]

## 2014-09-07 NOTE — Telephone Encounter (Signed)
Pt called again about this.  Have you seen this by chance.  They cant schedule surgery until they get this clearance per pt .     [phone]-  She is request call back today.

## 2014-09-08 ENCOUNTER — Encounter: Payer: Self-pay | Admitting: Neurology

## 2014-09-08 ENCOUNTER — Ambulatory Visit (INDEPENDENT_AMBULATORY_CARE_PROVIDER_SITE_OTHER): Payer: Medicare Other | Admitting: Neurology

## 2014-09-08 VITALS — BP 111/70 | HR 70 | Resp 22 | Ht 63.25 in | Wt 145.0 lb

## 2014-09-08 DIAGNOSIS — G609 Hereditary and idiopathic neuropathy, unspecified: Secondary | ICD-10-CM

## 2014-09-08 DIAGNOSIS — G2581 Restless legs syndrome: Secondary | ICD-10-CM | POA: Diagnosis not present

## 2014-09-08 DIAGNOSIS — J209 Acute bronchitis, unspecified: Secondary | ICD-10-CM

## 2014-09-08 MED ORDER — GABAPENTIN 300 MG PO CAPS
ORAL_CAPSULE | ORAL | Status: DC
Start: 2014-09-08 — End: 2015-09-10

## 2014-09-08 MED ORDER — MAGNESIUM GLUCONATE 30 MG PO TABS: 30.0000 mg | ORAL_TABLET | Freq: Two times a day (BID) | ORAL | Status: DC

## 2014-09-08 NOTE — Patient Instructions (Signed)
Charcot-Marie-Tooth Disorder Charcot-Marie-Tooth (CMT) Disorder is a group of inherited diseases which affect the nerves to the arms and legs. The problems can range from very mild to severe weakness. The feet and legs tend to be affected first. High foot arches and curled toes are often the first signs of this disorder. Because the muscles are not getting the right signals from the brain, walking may become difficult. There may be numbness as well. Fingers and hands may also be involved. Over time, the feet and hands may be deformed.  Peripheral Neuropathy Peripheral neuropathy is a type of nerve damage. It affects nerves that carry signals between the spinal cord and other parts of the body. These are called peripheral nerves. With peripheral neuropathy, one nerve or a group of nerves may be damaged.  CAUSES  Many things can damage peripheral nerves. For some people with peripheral neuropathy, the cause is unknown. Some causes include:  Diabetes. This is the most common cause of peripheral neuropathy.  Injury to a nerve.  Pressure or stress on a nerve that lasts a long time.  Too little vitamin B. Alcoholism can lead to this.  Infections.  Autoimmune diseases, such as multiple sclerosis and systemic lupus erythematosus.  Inherited nerve diseases.  Some medicines, such as cancer drugs.  Toxic substances, such as lead and mercury.  Too little blood flowing to the legs.  Kidney disease.  Thyroid disease. SIGNS AND SYMPTOMS  Different people have different symptoms. The symptoms you have will depend on which of your nerves is damaged. Common symptoms include:  Loss of feeling (numbness) in the feet and hands.  Tingling in the feet and hands.  Pain that burns.  Very sensitive skin.  Weakness.  Not being able to move a part of the body (paralysis).  Muscle twitching.  Clumsiness or poor coordination.  Loss of balance.  Not being able to control your bladder.  Feeling  dizzy.  Sexual problems. DIAGNOSIS  Peripheral neuropathy is a symptom, not a disease. Finding the cause of peripheral neuropathy can be hard. To figure that out, your health care provider will take a medical history and do a physical exam. A neurological exam will also be done. This involves checking things affected by your brain, spinal cord, and nerves (nervous system). For example, your health care provider will check your reflexes, how you move, and what you can feel.  Other types of tests may also be ordered, such as:  Blood tests.  A test of the fluid in your spinal cord.  Imaging tests, such as CT scans or an MRI.  Electromyography (EMG). This test checks the nerves that control muscles.  Nerve conduction velocity tests. These tests check how fast messages pass through your nerves.  Nerve biopsy. A small piece of nerve is removed. It is then checked under a microscope. TREATMENT   Medicine is often used to treat peripheral neuropathy. Medicines may include:  Pain-relieving medicines. Prescription or over-the-counter medicine may be suggested.  Antiseizure medicine. This may be used for pain.  Antidepressants. These also may help ease pain from neuropathy.  Lidocaine. This is a numbing medicine. You might wear a patch or be given a shot.  Mexiletine. This medicine is typically used to help control irregular heart rhythms.  Surgery. Surgery may be needed to relieve pressure on a nerve or to destroy a nerve that is causing pain.  Physical therapy to help movement.  Assistive devices to help movement. HOME CARE INSTRUCTIONS   Only take over-the-counter  or prescription medicines as directed by your health care provider. Follow the instructions carefully for any given medicines. Do not take any other medicines without first getting approval from your health care provider.  If you have diabetes, work closely with your health care provider to keep your blood sugar under  control.  If you have numbness in your feet:  Check every day for signs of injury or infection. Watch for redness, warmth, and swelling.  Wear padded socks and comfortable shoes. These help protect your feet.  Do not do things that put pressure on your damaged nerve.  Do not smoke. Smoking keeps blood from getting to damaged nerves.  Avoid or limit alcohol. Too much alcohol can cause a lack of B vitamins. These vitamins are needed for healthy nerves.  Develop a good support system. Coping with peripheral neuropathy can be stressful. Talk to a mental health specialist or join a support group if you are struggling.  Follow up with your health care provider as directed. SEEK MEDICAL CARE IF:   You have new signs or symptoms of peripheral neuropathy.  You are struggling emotionally from dealing with peripheral neuropathy.  You have a fever. SEEK IMMEDIATE MEDICAL CARE IF:   You have an injury or infection that is not healing.  You feel very dizzy or begin vomiting.  You have chest pain.  You have trouble breathing. Document Released: 07/11/2002 Document Revised: 04/02/2011 Document Reviewed: 03/28/2013 [facility] Patient Information 2015 [place], [place]. This information is not intended to replace advice given to you by your health care provider. Make sure you discuss any questions you have with your health care provider.

## 2014-09-08 NOTE — Progress Notes (Signed)
[facility]   Provider:  Larey Seat, M D  Referring Provider: Hendricks Limes, MD Primary Care Physician:  Unice Cobble, MD  Chief Complaint  Patient presents with  . RV  Neuropathy    Rm 10, alone    HPI:  Cassie White is a 74 y.o. femaleseen here as a prolonged  revisit  from Dr. Linna Darner for  Neuropathy/ RLS.   If indicated Cassie White was previously seen here by my colleague Dr. Elvia Collum until March 2008. 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 The neuropathy was classified as idiopathic or hereditary. Numbness and burning in her feet improved but is not gone. Previous nerve conduction studies and EMGs have shown a more towards greater than sensory polyneuropathy. 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 She had tried Lyrica but could not continue due to severe drowsiness. 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 The patient walks without any assistive device and has not had any recent falls. She is here today to get refills for her gabapentin. We also added magnesium to her medication list. She still drinks caffeine once a day but denies any tobacco alcohol or drug use. She is right-handed of normal weight, and has regular,  normal vital signs. Review of Systems: Out of a complete 14 system review, the patient complains of only the following symptoms, and all other reviewed systems are negative. Burning and tingling, numbness in feet and all 10 toes.   History   Social History  . Marital Status: Married    Spouse Name: N/A    Number of Children: 3  . Years of Education: HS grad   Occupational History  . [employer]- bus driver- retired    Social History Main Topics  . Smoking status: Never  Smoker   . Smokeless tobacco: Not on file  . Alcohol Use: No  . Drug Use: No  . Sexual Activity: No   Other Topics Concern  . Not on file   Social History Narrative   Caffiene, 1 cup daily avg.   Pt is widow.      Family History  Problem Relation Age of Onset  . Heart attack Father     in 78s  . Stroke Father 11  . Ovarian cancer Mother   . Coronary artery disease Brother   . Heart attack Son 15    Smoker  . Diabetes Neg Hx     Past Medical History  Diagnosis Date  . Arthritis     DJD  . Hyperlipidemia   . GERD (gastroesophageal reflux disease)   . Osteoporosis, senile   . Diverticulosis   . Scoliosis   . Peripheral neuropathy   . Hx of cardiovascular stress test     Lexiscan Myoview (2/14):  Low risk, small ant defect likely breast attenuation, no ischemia, EF 69% (no change from 2010).    . Mitral regurgitation     Echo (2/14):  EF 60-65%, Gr 1 DD, mild AI, mild bileaflet MVP, mod post directed MR, mild LAE, PASP 35.  Marland Kitchen MVP (mitral valve prolapse)   . Shoulder pain left    to have surgery 10/02/14 for rotator cuff repair    Past Surgical History  Procedure Laterality Date  .  Tonsillectomy    . Vaginal hysterectomy      For Uterine Deviation   . 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 Cholecystectomy  03/2010    Dr.Gross  . Lumpectomy  06/2009    "Fatty Necrosis"  . Breast surgery  Nov 2010    Benign biopsy, right  . Cystocele repair    . Combined hysterectomy vaginal / oophorectomy / a&p repair  2979    uncertain if ovaries were removed or not    Current Outpatient Prescriptions  Medication Sig Dispense Refill  . amoxicillin (AMOXIL) 875 MG tablet Take 875 mg by mouth 2 (two) times daily.   0  . aspirin 81 MG tablet Take 81 mg by mouth daily.      . B Complex-C (B-COMPLEX WITH VITAMIN C) tablet Take 1 tablet by mouth daily.    . Biotin 5000 MCG CAPS Take 5,000 mcg by mouth daily.    . Calcium Carbonate (CALCIUM 600 PO) Take 600 mg by  mouth 2 (two) times daily.     . cholecalciferol (VITAMIN D) 1000 UNITS tablet Take 5,000 Units by mouth daily.     Marland Kitchen gabapentin (NEURONTIN) 300 MG capsule TAKE 1 CAPSULE BY MOUTH AT LUNCH, 1 AT DINNER, AND 2 AT BEDTIME 360 capsule 3  . metoprolol tartrate (LOPRESSOR) 25 MG tablet Take 12.5 mg by mouth 2 (two) times daily.    . nitroGLYCERIN (NITROSTAT) 0.4 MG SL tablet Place 1 tablet (0.4 mg total) under the tongue every 5 (five) minutes as needed for chest pain. 25 tablet 5  . Omega-3 Fatty Acids (FISH OIL) 1200 MG CAPS Take 1,000 mg by mouth 2 (two) times daily.     . pravastatin (PRAVACHOL) 20 MG tablet Take 20 mg by mouth 3 (three) times a week. Take on Monday, Wednesday, and friday     No current facility-administered medications for this visit.    Allergies as of 09/08/2014 - Review Complete 09/08/2014  Allergen Reaction Noted  . Sulfa antibiotics Swelling 09/07/2013    Vitals: BP 111/70 mmHg  Pulse 70  Resp 22  Ht 5' 3.25" (1.607 m)  Wt 145 lb (65.772 kg)  BMI 25.47 kg/m2 Last Weight:  Wt Readings from Last 1 Encounters:  09/08/14 145 lb (65.772 kg)       Last Height:   Ht Readings from Last 1 Encounters:  09/08/14 5' 3.25" (1.607 m)    Physical exam:  General: The patient is awake, alert and appears not in acute distress. The patient is well groomed. Head: Normocephalic, atraumatic. Neck is supple. Mallampati 3 , neck circumference: 14  Cardiovascular:  Regular rate and rhythm , without  murmurs or carotid bruit, and without distended neck veins. Respiratory: Lungs are clear to auscultation. Skin:  Without evidence of edema, or rash Trunk: scoliosis affected posture. The patent walks hunched.    Neurologic exam : The patient is awake and alert, oriented to place and time.  Memory subjective  described as intact. There is a normal attention span & concentration ability. Speech is fluent without dysarthria, dysphonia or aphasia. Mood and affect are  appropriate.  Cranial nerves: Pupils are equal and briskly reactive to light. Funduscopic exam without  evidence of pallor or edema. Extraocular movements  in vertical and horizontal planes intact and without nystagmus. Visual fields by finger perimetry are intact. Hearing to finger rub intact.  Facial sensation intact to fine touch. Facial motor strength is symmetric and tongue and  uvula move midline.  Motor exam: Normal tone  and symmetric normal strength in all extremities. Please note that the patient has over stretchable finger joints and atrophy of the meter carpal phalanx. She also can rotate her thumbs to an unusual high degree. She is not of very tall build and rather petite. I do wonder however if this could be an Ehlers  Danlos syndrome. Sensory:  Fine touch, pinprick and vibration were tested in all extremities.  Her feet are tingling and the bottom of the foot is the most affected and the toes, all 10.   Cramping sensation in the legs are improved on magnesium, Proprioception is normal.  Coordination: Rapid alternating movements in the fingers/hands/ Finger-to-nose maneuver tested and normal without evidence of ataxia, dysmetria or tremor.  Gait and station: Patient walks without assistive device and is able and assisted stool climb up to the exam table.  Strength within normal limits. Stance is stable and normal.  Tandem gait is affected by scoliosis, she is bend forwards. Steps are unfragmented. Romberg testing is  normal.  Deep tendon reflexes: in the  upper and lower extremities are symmetric and intact. Babinski maneuver response is equivocal / downgoing.     Assessment:  After physical and neurologic examination, review of laboratory studies, imaging, neurophysiology testing and pre-existing records,  30 minutes assessment :  Mrs. Peake has been diagnosed in March 2008 with an etiopathic neuropathy by my colleague Dr. Elvia Collum. Gabapentin was added at that time  previously she had tried Lyrica. The numbness and burning in her feet has improved but is never gone. Nerve conduction studies and EMGs about 5 years ago documented a more poor greater than sensory motor polyneuropathy. Please also note that this patient has limbal spinal stenosis, scoliosis and diffuse degenerative disc disease. He has hammertoes, hypermobility of the hand and finger joints. And some muscle atrophy at the basal phalanx.  I will refill the patient's Neurontin and recommend to stay on her magnesium supplement which has helped to eliminate leg cramps. Neither a thyroid disorder nor diabetes has been diagnosed in the meantime since the patient was last seen 2-1/2 years ago. She continues to take a vitamin B complex biotin vitamin D magnesium and fish oil. Remarkable is that she is on Pravachol 20 mg every other day.    Plan:  Treatment plan : see above.

## 2014-09-14 ENCOUNTER — Other Ambulatory Visit: Payer: Self-pay

## 2014-09-14 MED ORDER — METOPROLOL TARTRATE 25 MG PO TABS: 12.5000 mg | ORAL_TABLET | Freq: Two times a day (BID) | ORAL | Status: DC

## 2014-10-02 DIAGNOSIS — X58XXXA Exposure to other specified factors, initial encounter: Secondary | ICD-10-CM | POA: Diagnosis not present

## 2014-10-02 DIAGNOSIS — S43432A Superior glenoid labrum lesion of left shoulder, initial encounter: Secondary | ICD-10-CM | POA: Diagnosis not present

## 2014-10-02 DIAGNOSIS — M75102 Unspecified rotator cuff tear or rupture of left shoulder, not specified as traumatic: Secondary | ICD-10-CM | POA: Diagnosis not present

## 2014-10-02 DIAGNOSIS — Y929 Unspecified place or not applicable: Secondary | ICD-10-CM | POA: Diagnosis not present

## 2014-10-02 DIAGNOSIS — S46012A Strain of muscle(s) and tendon(s) of the rotator cuff of left shoulder, initial encounter: Secondary | ICD-10-CM | POA: Diagnosis not present

## 2014-10-02 DIAGNOSIS — M75101 Unspecified rotator cuff tear or rupture of right shoulder, not specified as traumatic: Secondary | ICD-10-CM | POA: Diagnosis not present

## 2014-10-02 DIAGNOSIS — G8918 Other acute postprocedural pain: Secondary | ICD-10-CM | POA: Diagnosis not present

## 2014-10-02 DIAGNOSIS — M19012 Primary osteoarthritis, left shoulder: Secondary | ICD-10-CM | POA: Diagnosis not present

## 2014-10-02 HISTORY — PX: ROTATOR CUFF REPAIR: SHX139

## 2014-10-09 ENCOUNTER — Ambulatory Visit: Payer: Medicare Other | Attending: Orthopedic Surgery | Admitting: Physical Therapy

## 2014-10-09 DIAGNOSIS — M25612 Stiffness of left shoulder, not elsewhere classified: Secondary | ICD-10-CM | POA: Insufficient documentation

## 2014-10-09 DIAGNOSIS — M25512 Pain in left shoulder: Secondary | ICD-10-CM

## 2014-10-09 NOTE — Therapy (Signed)
[facility] [place], [place], [address] Phone: [phone]   Fax:  [phone]  Physical Therapy Evaluation  Patient Details  Name: Cassie White MRN: [id number] Date of Birth: [birth year] Referring Provider:  Netta Cedars, MD  Encounter Date: 10/09/2014      PT End of Session - 10/09/14 1121    Visit Number 1   Number of Visits 12   PT Start Time 1116   PT Stop Time 1154   PT Time Calculation (min) 38 min   Activity Tolerance Patient tolerated treatment well   Behavior During Therapy [facility] for tasks assessed/performed      Past Medical History  Diagnosis Date  . Arthritis     DJD  . Hyperlipidemia   . GERD (gastroesophageal reflux disease)   . Osteoporosis, senile   . Diverticulosis   . Scoliosis   . Peripheral neuropathy   . Hx of cardiovascular stress test     Lexiscan Myoview (2/14):  Low risk, small ant defect likely breast attenuation, no ischemia, EF 69% (no change from 2010).    . Mitral regurgitation     Echo (2/14):  EF 60-65%, Gr 1 DD, mild AI, mild bileaflet MVP, mod post directed MR, mild LAE, PASP 35.  Marland Kitchen MVP (mitral valve prolapse)   . Shoulder pain left    to have surgery 10/02/14 for rotator cuff repair    Past Surgical History  Procedure Laterality Date  . Tonsillectomy    . Vaginal hysterectomy      For Uterine Deviation   . 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 Cholecystectomy  03/2010    Dr.Gross  . Lumpectomy  06/2009    "Fatty Necrosis"  . Breast surgery  Nov 2010    Benign biopsy, right  . Cystocele repair    . Combined hysterectomy vaginal / oophorectomy / a&p repair  9323    uncertain if ovaries were removed or not    There were no vitals taken for this visit.  Visit Diagnosis:  Left shoulder pain - Plan: PT plan of care cert/re-cert  Shoulder stiffness, left - Plan: PT plan of care cert/re-cert      Subjective Assessment - 10/09/14 1121    Symptoms  Dr. said "the worst he has ever seen."          [facility] PT Assessment - 10/09/14 0001    Assessment   Medical Diagnosis S/P Rotator Cuff Surgery.   Onset Date --  January 2016.   Next MD Visit 10/12/14   Precautions   Precautions Shoulder   Precaution Comments Left shoulder gentle ROM.   Balance Screen   Has the patient fallen in the past 6 months No   Has the patient had a decrease in activity level because of a fear of falling?  No   Is the patient reluctant to leave their home because of a fear of falling?  No   Home Environment   Living Enviornment Private residence   ROM / Strength   AROM / PROM / Strength PROM   PROM   Overall PROM Comments Flexion= 102 degrees and ER= -17 degrees from neutral.   PROM Assessment Site Shoulder   Right/Left Shoulder Left   Palpation   Palpation --  Steri-strips intact over left shoulder.                  [facility] Adult PT Treatment/Exercise - 10/09/14 0001  Manual Therapy   Manual Therapy Passive ROM   Passive ROM Left shoulder passive-assistive ROM into flexion and ER x 8 minutes.                  PT Short Term Goals - Nov 05, 2014 1157    PT SHORT TERM GOAL #1   Title STG's=LTG's.           PT Long Term Goals - Nov 05, 2014 1159    PT LONG TERM GOAL #1   Title Independent with an advanced HEP.   Time 4   Period Weeks   Status New   PT LONG TERM GOAL #2   Title Active left shoulder flexion to 150 degrees so the patient can easily reach overhead   Time 4   Period Weeks   Status New   PT LONG TERM GOAL #3   Title Active ER to 70 degrees+ to allow for easily donning/doffing of apparel   Time 4   Period Weeks   Status New   PT LONG TERM GOAL #4   Title Increase ROM so patient is able to reach behind back to L3 with left hand   Time 4   Period Weeks   Status New   PT LONG TERM GOAL #5   Title Increase left shoulder strength to a solid 4+/5 to increase stability for performance of functional activities    Additional Long Term Goals   Additional Long Term Goals Yes   PT LONG TERM GOAL #6   Title Perform ADL's with pain not > 2-3/10.   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  Left shoulder sore then hurting.  Underwent a Left RTC repair surgery 10/02/14.    Rehab Potential Excellent   PT Frequency 3x / week   PT Duration 4 weeks  12 visits.   PT Next Visit Plan Gentle let shoulder passive-asisitive ROM.   PT Home Exercise Plan Pendulum.   Consulted and Agree with Plan of Care Patient          G-Codes - 11/05/2014 11-28-1204    Functional Assessment Tool Used FOTO   Functional Limitation Mobility: Walking and moving around   Mobility: Walking and Moving Around Current Status [phone]) 100 percent impaired, limited or restricted   Mobility: Walking and Moving Around Goal Status [id number]) At least 1 percent but less than 20 percent impaired, limited or restricted       Problem List Patient Active Problem List   Diagnosis Date Noted  . Acute bronchitis 09/08/2014  . Mitral insufficiency 05/12/2014  . HTN (hypertension) 04/28/2014  . Lactic acidosis 09/07/2013  . Mitral valve prolapse 06/14/2013  . Chest pain, exertional 09/13/2012  . Pernicious anemia 09/07/2012  . DIVERTICULOSIS, COLON 09/06/2009  . Osteoporosis 09/06/2009  . HYPERLIPIDEMIA 09/04/2009  . PERIPHERAL NEUROPATHY 09/04/2009  . LOW BACK PAIN, CHRONIC 04/08/2007    APPLEGATE, [place] MPT 2014/11/05, 12:11 PM  [facility] [address] [place], [place], [address] Phone: [phone]   Fax:  [phone]

## 2014-10-12 ENCOUNTER — Encounter: Payer: BLUE CROSS/BLUE SHIELD | Admitting: Physical Therapy

## 2014-10-12 DIAGNOSIS — S43432D Superior glenoid labrum lesion of left shoulder, subsequent encounter: Secondary | ICD-10-CM | POA: Diagnosis not present

## 2014-10-13 ENCOUNTER — Ambulatory Visit: Payer: Medicare Other | Admitting: *Deleted

## 2014-10-13 ENCOUNTER — Encounter: Payer: Self-pay | Admitting: *Deleted

## 2014-10-13 DIAGNOSIS — M25612 Stiffness of left shoulder, not elsewhere classified: Secondary | ICD-10-CM

## 2014-10-13 DIAGNOSIS — M25512 Pain in left shoulder: Secondary | ICD-10-CM | POA: Diagnosis not present

## 2014-10-13 NOTE — Therapy (Signed)
[facility] [place], [place], [address] Phone: [phone]   Fax:  [phone]  Physical Therapy Treatment  Patient Details  Name: Cassie White MRN: [id number] Date of Birth: 1940-10-13 Referring Provider:  Hendricks Limes, MD  Encounter Date: 10/13/2014      PT End of Session - 10/13/14 1115    Visit Number 2   Number of Visits 12   PT Start Time 1030   PT Stop Time 1116   PT Time Calculation (min) 46 min      Past Medical History  Diagnosis Date  . Arthritis     DJD  . Hyperlipidemia   . GERD (gastroesophageal reflux disease)   . Osteoporosis, senile   . Diverticulosis   . Scoliosis   . Peripheral neuropathy   . Hx of cardiovascular stress test     Lexiscan Myoview (2/14):  Low risk, small ant defect likely breast attenuation, no ischemia, EF 69% (no change from 2010).    . Mitral regurgitation     Echo (2/14):  EF 60-65%, Gr 1 DD, mild AI, mild bileaflet MVP, mod post directed MR, mild LAE, PASP 35.  Marland Kitchen MVP (mitral valve prolapse)   . Shoulder pain left    to have surgery 10/02/14 for rotator cuff repair    Past Surgical History  Procedure Laterality Date  . Tonsillectomy    . Vaginal hysterectomy      For Uterine Deviation   . 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 Cholecystectomy  03/2010    Dr.Gross  . Lumpectomy  06/2009    "Fatty Necrosis"  . Breast surgery  Nov 2010    Benign biopsy, right  . Cystocele repair    . Combined hysterectomy vaginal / oophorectomy / a&p repair  3893    uncertain if ovaries were removed or not    There were no vitals filed for this visit.  Visit Diagnosis:  Left shoulder pain  Shoulder stiffness, left      Subjective Assessment - 10/13/14 1107    Symptoms LT shldr doing good   Currently in Pain? Yes   Pain Score 3    Pain Location Shoulder   Pain Orientation Left   Aggravating Factors  certain motions   Pain Relieving Factors meds                       [facility] Adult PT Treatment/Exercise - 10/13/14 0001    Manual Therapy   Manual Therapy Passive ROM   Passive ROM Left shoulder passive-assistive ROM into flexion and ER /IR to abdomen                  PT Short Term Goals - 10/09/14 1157    PT SHORT TERM GOAL #1   Title STG's=LTG's.           PT Long Term Goals - 10/09/14 1159    PT LONG TERM GOAL #1   Title Independent with an advanced HEP.   Time 4   Period Weeks   Status New   PT LONG TERM GOAL #2   Title Active left shoulder flexion to 150 degrees so the patient can easily reach overhead   Time 4   Period Weeks   Status New   PT LONG TERM GOAL #3   Title Active ER to 70 degrees+ to allow for easily donning/doffing of apparel   Time 4  Period Weeks   Status New   PT LONG TERM GOAL #4   Title Increase ROM so patient is able to reach behind back to L3 with left hand   Time 4   Period Weeks   Status New   PT LONG TERM GOAL #5   Title Increase left shoulder strength to a solid 4+/5 to increase stability for performance of functional activities   Additional Long Term Goals   Additional Long Term Goals Yes   PT LONG TERM GOAL #6   Title Perform ADL's with pain not > 2-3/10.   Time 4   Period Weeks   Status New               Plan - 10/13/14 1116    Clinical Impression Statement Did good after last RX   Rehab Potential Excellent   PT Frequency 3x / week   PT Duration 4 weeks   PT Next Visit Plan Gentle let shoulder passive-asisitive ROM.   PT Home Exercise Plan Pendulum.   Consulted and Agree with Plan of Care Patient        Problem List Patient Active Problem List   Diagnosis Date Noted  . Acute bronchitis 09/08/2014  . Mitral insufficiency 05/12/2014  . HTN (hypertension) 04/28/2014  . Lactic acidosis 09/07/2013  . Mitral valve prolapse 06/14/2013  . Chest pain, exertional 09/13/2012  . Pernicious anemia 09/07/2012  . DIVERTICULOSIS, COLON  09/06/2009  . Osteoporosis 09/06/2009  . HYPERLIPIDEMIA 09/04/2009  . PERIPHERAL NEUROPATHY 09/04/2009  . LOW BACK PAIN, CHRONIC 04/08/2007    Arieonna Medine,CHRIS, PTA 10/13/2014, 11:34 AM  [facility] [address] [place], [place], [address] Phone: [phone]   Fax:  [phone]

## 2014-10-18 ENCOUNTER — Ambulatory Visit: Payer: Medicare Other | Admitting: Physical Therapy

## 2014-10-18 ENCOUNTER — Encounter: Payer: Self-pay | Admitting: Physical Therapy

## 2014-10-18 DIAGNOSIS — M25512 Pain in left shoulder: Secondary | ICD-10-CM

## 2014-10-18 DIAGNOSIS — M25612 Stiffness of left shoulder, not elsewhere classified: Secondary | ICD-10-CM | POA: Diagnosis not present

## 2014-10-18 NOTE — Therapy (Signed)
[facility] [place], [place], [address] Phone: [phone]   Fax:  [phone]  Physical Therapy Treatment  Patient Details  Name: Cassie White MRN: [id number] Date of Birth: 08-Jun-1941 Referring Provider:  Hendricks Limes, MD  Encounter Date: 10/18/2014      PT End of Session - 10/18/14 1111    Visit Number 3   Number of Visits 12   PT Start Time 1030   PT Stop Time 1102   PT Time Calculation (min) 32 min   Activity Tolerance Patient tolerated treatment well   Behavior During Therapy [facility] for tasks assessed/performed      Past Medical History  Diagnosis Date  . Arthritis     DJD  . Hyperlipidemia   . GERD (gastroesophageal reflux disease)   . Osteoporosis, senile   . Diverticulosis   . Scoliosis   . Peripheral neuropathy   . Hx of cardiovascular stress test     Lexiscan Myoview (2/14):  Low risk, small ant defect likely breast attenuation, no ischemia, EF 69% (no change from 2010).    . Mitral regurgitation     Echo (2/14):  EF 60-65%, Gr 1 DD, mild AI, mild bileaflet MVP, mod post directed MR, mild LAE, PASP 35.  Marland Kitchen MVP (mitral valve prolapse)   . Shoulder pain left    to have surgery 10/02/14 for rotator cuff repair    Past Surgical History  Procedure Laterality Date  . Tonsillectomy    . Vaginal hysterectomy      For Uterine Deviation   . 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 Cholecystectomy  03/2010    Dr.Gross  . Lumpectomy  06/2009    "Fatty Necrosis"  . Breast surgery  Nov 2010    Benign biopsy, right  . Cystocele repair    . Combined hysterectomy vaginal / oophorectomy / a&p repair  3875    uncertain if ovaries were removed or not    There were no vitals filed for this visit.  Visit Diagnosis:  Left shoulder pain  Shoulder stiffness, left      Subjective Assessment - 10/18/14 1032    Symptoms Left shoulder reported as sore and was reported as painful when sling  was removed for treatment. States that her daughter has her copy of HEP but she will get that today. Patient reported that she took her pain medication 1 hour before treatment.   Currently in Pain? Yes   Pain Score 7    Pain Location Shoulder   Pain Orientation Left;Anterior  Patient palpated the painful area of left shoulder and she was predominately reporting anterior shoulder into bicipital groove region.   Pain Descriptors / Indicators Sore;Aching   Aggravating Factors  Move shoulder in wrong direction   Pain Relieving Factors Medication and rest            [facility] PT Assessment - 10/18/14 0001    Assessment   Medical Diagnosis S/P Rotator Cuff Surgery.   ROM / Strength   AROM / PROM / Strength --  PROM/AAROM   PROM   Overall PROM  Deficits   Overall PROM Comments flex=125 deg/ scap= 140 deg/ ER= 30 deg/ IR= 70 deg   PROM Assessment Site Shoulder   Right/Left Shoulder Left                           PT Education - 10/18/14  1112    Education provided Yes   Education Details Patient encouraged to complete HEP as instructed.   Person(s) Educated Patient   Methods Explanation   Comprehension Verbalized understanding;Need further instruction          PT Short Term Goals - 10/09/14 1157    PT SHORT TERM GOAL #1   Title STG's=LTG's.           PT Long Term Goals - 10/18/14 1114    PT LONG TERM GOAL #1   Title Independent with an advanced HEP.   Time 4   Period Weeks   Status On-going   PT LONG TERM GOAL #2   Title Active left shoulder flexion to 150 degrees so the patient can easily reach overhead   Time 4   Period Weeks   Status On-going   PT LONG TERM GOAL #3   Title Active ER to 70 degrees+ to allow for easily donning/doffing of apparel   Time 4   Period Weeks   Status New   PT LONG TERM GOAL #4   Title Increase ROM so patient is able to reach behind back to L3 with left hand   Time 4   Period Weeks   Status On-going   PT LONG TERM  GOAL #5   Title Increase left shoulder strength to a solid 4+/5 to increase stability for performance of functional activities   Time 4   Period Weeks   Status On-going   PT LONG TERM GOAL #6   Title Perform ADL's with pain not > 2-3/10.   Time 4   Period Weeks   Status On-going               Plan - 10/18/14 1102    Clinical Impression Statement Patient tolerated treatment well. ER demonstrated tight end-feel. Patient was encouraged to retrieve HEP from daughter in order to complete the HEP appropriately. Patient denied pain at the end of treatment.  All long term goals on-going at this treatment.   Rehab Potential Excellent   PT Frequency 3x / week   PT Duration 4 weeks   PT Treatment/Interventions ADLs/Self Care Home Management;Moist Heat;Therapeutic activities;Patient/family education;Scar mobilization;Passive range of motion;Therapeutic exercise;Ultrasound;Manual techniques;Neuromuscular re-education;Electrical Stimulation   PT Next Visit Plan Continue Passive- AAROM next session. Patient reports that she is to see the MD in three weeks.    Consulted and Agree with Plan of Care Patient        Problem List Patient Active Problem List   Diagnosis Date Noted  . Acute bronchitis 09/08/2014  . Mitral insufficiency 05/12/2014  . HTN (hypertension) 04/28/2014  . Lactic acidosis 09/07/2013  . Mitral valve prolapse 06/14/2013  . Chest pain, exertional 09/13/2012  . Pernicious anemia 09/07/2012  . DIVERTICULOSIS, COLON 09/06/2009  . Osteoporosis 09/06/2009  . HYPERLIPIDEMIA 09/04/2009  . PERIPHERAL NEUROPATHY 09/04/2009  . LOW BACK PAIN, CHRONIC 04/08/2007    Wynelle Fanny, PTA 10/18/2014, 12:59 PM  [facility] [address] [place], [place], [address] Phone: [phone]   Fax:  [phone]

## 2014-10-18 NOTE — Therapy (Signed)
[facility] [place], [place], [address] Phone: [phone]   Fax:  [phone]  Physical Therapy Treatment  Patient Details  Name: Cassie White MRN: [id number] Date of Birth: [birth year] Referring Provider:  Hendricks Limes, MD  Encounter Date: 10/18/2014      PT End of Session - 10/18/14 1111    Visit Number 3   Number of Visits 12   PT Start Time 1030   PT Stop Time 1102   PT Time Calculation (min) 32 min   Activity Tolerance Patient tolerated treatment well   Behavior During Therapy [facility] for tasks assessed/performed      Past Medical History  Diagnosis Date  . Arthritis     DJD  . Hyperlipidemia   . GERD (gastroesophageal reflux disease)   . Osteoporosis, senile   . Diverticulosis   . Scoliosis   . Peripheral neuropathy   . Hx of cardiovascular stress test     Lexiscan Myoview (2/14):  Low risk, small ant defect likely breast attenuation, no ischemia, EF 69% (no change from 2010).    . Mitral regurgitation     Echo (2/14):  EF 60-65%, Gr 1 DD, mild AI, mild bileaflet MVP, mod post directed MR, mild LAE, PASP 35.  Marland Kitchen MVP (mitral valve prolapse)   . Shoulder pain left    to have surgery 10/02/14 for rotator cuff repair    Past Surgical History  Procedure Laterality Date  . Tonsillectomy    . Vaginal hysterectomy      For Uterine Deviation   . 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 Cholecystectomy  03/2010    Dr.Gross  . Lumpectomy  06/2009    "Fatty Necrosis"  . Breast surgery  Nov 2010    Benign biopsy, right  . Cystocele repair    . Combined hysterectomy vaginal / oophorectomy / a&p repair  6962    uncertain if ovaries were removed or not    There were no vitals filed for this visit.  Visit Diagnosis:  Left shoulder pain  Shoulder stiffness, left      Subjective Assessment - 10/18/14 1032    Symptoms Left shoulder reported as sore and was reported as painful when sling  was removed for treatment. States that her daughter has her copy of HEP but she will get that today. Patient reported that she took her pain medication 1 hour before treatment.   Currently in Pain? Yes   Pain Score 7    Pain Location Shoulder   Pain Orientation Left   Pain Descriptors / Indicators Sore;Aching   Aggravating Factors  Move shoulder in wrong direction   Pain Relieving Factors Medication and rest            [facility] PT Assessment - 10/18/14 0001    Assessment   Medical Diagnosis S/P Rotator Cuff Surgery.   ROM / Strength   AROM / PROM / Strength --  PROM/AAROM   PROM   Overall PROM  Deficits   Overall PROM Comments flex=125 deg/ scap= 140 deg/ ER= 30 deg/ IR= 70 deg   PROM Assessment Site Shoulder   Right/Left Shoulder Left                           PT Education - 10/18/14 1112    Education provided Yes   Education Details Patient encouraged to complete HEP as instructed.  Person(s) Educated Patient   Methods Explanation   Comprehension Verbalized understanding;Need further instruction          PT Short Term Goals - 10/09/14 1157    PT SHORT TERM GOAL #1   Title STG's=LTG's.           PT Long Term Goals - 10/18/14 1114    PT LONG TERM GOAL #1   Title Independent with an advanced HEP.   Time 4   Period Weeks   Status On-going   PT LONG TERM GOAL #2   Title Active left shoulder flexion to 150 degrees so the patient can easily reach overhead   Time 4   Period Weeks   Status On-going   PT LONG TERM GOAL #3   Title Active ER to 70 degrees+ to allow for easily donning/doffing of apparel   Time 4   Period Weeks   Status New   PT LONG TERM GOAL #4   Title Increase ROM so patient is able to reach behind back to L3 with left hand   Time 4   Period Weeks   Status On-going   PT LONG TERM GOAL #5   Title Increase left shoulder strength to a solid 4+/5 to increase stability for performance of functional activities   Time 4    Period Weeks   Status On-going   PT LONG TERM GOAL #6   Title Perform ADL's with pain not > 2-3/10.   Time 4   Period Weeks   Status On-going               Plan - 10/18/14 1102    Clinical Impression Statement Patient tolerated treatment well. ER demonstrated tight end-feel. Patient was encouraged to retrieve HEP from daughter in order to complete the HEP appropriately. Patient denied pain at the end of treatment.  All long term goals on-going at this treatment.   Rehab Potential Excellent   PT Frequency 3x / week   PT Duration 4 weeks   PT Treatment/Interventions ADLs/Self Care Home Management;Moist Heat;Therapeutic activities;Patient/family education;Scar mobilization;Passive range of motion;Therapeutic exercise;Ultrasound;Manual techniques;Neuromuscular re-education;Electrical Stimulation   PT Next Visit Plan Continue Passive- AAROM next session. Patient reports that she is to see the MD in three weeks.    Consulted and Agree with Plan of Care Patient        Problem List Patient Active Problem List   Diagnosis Date Noted  . Acute bronchitis 09/08/2014  . Mitral insufficiency 05/12/2014  . HTN (hypertension) 04/28/2014  . Lactic acidosis 09/07/2013  . Mitral valve prolapse 06/14/2013  . Chest pain, exertional 09/13/2012  . Pernicious anemia 09/07/2012  . DIVERTICULOSIS, COLON 09/06/2009  . Osteoporosis 09/06/2009  . HYPERLIPIDEMIA 09/04/2009  . PERIPHERAL NEUROPATHY 09/04/2009  . LOW BACK PAIN, CHRONIC 04/08/2007    Wynelle Fanny, PTA 10/18/2014, 11:17 AM  [facility] [address] [place], [place], [address] Phone: [phone]   Fax:  [phone]

## 2014-10-20 ENCOUNTER — Ambulatory Visit: Payer: Medicare Other | Admitting: Physical Therapy

## 2014-10-20 ENCOUNTER — Encounter: Payer: Self-pay | Admitting: Physical Therapy

## 2014-10-20 DIAGNOSIS — M25512 Pain in left shoulder: Secondary | ICD-10-CM | POA: Diagnosis not present

## 2014-10-20 DIAGNOSIS — M25612 Stiffness of left shoulder, not elsewhere classified: Secondary | ICD-10-CM

## 2014-10-20 NOTE — Therapy (Signed)
[facility] [place], [place], [address] Phone: [phone]   Fax:  [phone]  Physical Therapy Treatment  Patient Details  Name: Cassie White MRN: [id number] Date of Birth: 1941-01-22 Referring Provider:  Hendricks Limes, MD  Encounter Date: 10/20/2014      PT End of Session - 10/20/14 1105    Visit Number 4   Number of Visits 12   PT Start Time 1030   PT Stop Time 1114   PT Time Calculation (min) 44 min   Activity Tolerance Patient tolerated treatment well   Behavior During Therapy [facility] for tasks assessed/performed      Past Medical History  Diagnosis Date  . Arthritis     DJD  . Hyperlipidemia   . GERD (gastroesophageal reflux disease)   . Osteoporosis, senile   . Diverticulosis   . Scoliosis   . Peripheral neuropathy   . Hx of cardiovascular stress test     Lexiscan Myoview (2/14):  Low risk, small ant defect likely breast attenuation, no ischemia, EF 69% (no change from 2010).    . Mitral regurgitation     Echo (2/14):  EF 60-65%, Gr 1 DD, mild AI, mild bileaflet MVP, mod post directed MR, mild LAE, PASP 35.  Marland Kitchen MVP (mitral valve prolapse)   . Shoulder pain left    to have surgery 10/02/14 for rotator cuff repair    Past Surgical History  Procedure Laterality Date  . Tonsillectomy    . Vaginal hysterectomy      For Uterine Deviation   . 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 Cholecystectomy  03/2010    Dr.Gross  . Lumpectomy  06/2009    "Fatty Necrosis"  . Breast surgery  Nov 2010    Benign biopsy, right  . Cystocele repair    . Combined hysterectomy vaginal / oophorectomy / a&p repair  1062    uncertain if ovaries were removed or not    There were no vitals filed for this visit.  Visit Diagnosis:  Left shoulder pain  Shoulder stiffness, left      Subjective Assessment - 10/20/14 1050    Pain Score 3    Pain Location Shoulder   Pain Orientation Left   Pain  Descriptors / Indicators Aching;Constant   Multiple Pain Sites No                                 PT Short Term Goals - 10/09/14 1157    PT SHORT TERM GOAL #1   Title STG's=LTG's.           PT Long Term Goals - 10/18/14 1114    PT LONG TERM GOAL #1   Title Independent with an advanced HEP.   Time 4   Period Weeks   Status On-going   PT LONG TERM GOAL #2   Title Active left shoulder flexion to 150 degrees so the patient can easily reach overhead   Time 4   Period Weeks   Status On-going   PT LONG TERM GOAL #3   Title Active ER to 70 degrees+ to allow for easily donning/doffing of apparel   Time 4   Period Weeks   Status New   PT LONG TERM GOAL #4   Title Increase ROM so patient is able to reach behind back to L3 with left hand   Time  4   Period Weeks   Status On-going   PT LONG TERM GOAL #5   Title Increase left shoulder strength to a solid 4+/5 to increase stability for performance of functional activities   Time 4   Period Weeks   Status On-going   PT LONG TERM GOAL #6   Title Perform ADL's with pain not > 2-3/10.   Time 4   Period Weeks   Status On-going               Problem List Patient Active Problem List   Diagnosis Date Noted  . Acute bronchitis 09/08/2014  . Mitral insufficiency 05/12/2014  . HTN (hypertension) 04/28/2014  . Lactic acidosis 09/07/2013  . Mitral valve prolapse 06/14/2013  . Chest pain, exertional 09/13/2012  . Pernicious anemia 09/07/2012  . DIVERTICULOSIS, COLON 09/06/2009  . Osteoporosis 09/06/2009  . HYPERLIPIDEMIA 09/04/2009  . PERIPHERAL NEUROPATHY 09/04/2009  . LOW BACK PAIN, CHRONIC 04/08/2007    Treatment:  In supine performed passive-assistive left shoulder ROM into flexion and ER x 30 minutes.  Myalynn Lingle, [place]  MPT  10/20/2014, 11:26 AM  [facility] [address] [place], [place], [address] Phone: [phone]   Fax:   [phone]

## 2014-10-24 ENCOUNTER — Ambulatory Visit: Payer: Medicare Other | Admitting: *Deleted

## 2014-10-24 ENCOUNTER — Encounter: Payer: Self-pay | Admitting: *Deleted

## 2014-10-24 DIAGNOSIS — M25512 Pain in left shoulder: Secondary | ICD-10-CM | POA: Diagnosis not present

## 2014-10-24 DIAGNOSIS — M25612 Stiffness of left shoulder, not elsewhere classified: Secondary | ICD-10-CM | POA: Diagnosis not present

## 2014-10-24 NOTE — Therapy (Signed)
[facility] [place], [place], [address] Phone: [phone]   Fax:  [phone]  Physical Therapy Treatment  Patient Details  Name: Cassie White MRN: [id number] Date of Birth: [birth year] Referring Provider:  Hendricks Limes, MD  Encounter Date: 10/24/2014      PT End of Session - 10/24/14 1130    Visit Number 5   Number of Visits 12   Date for PT Re-Evaluation 11/06/14   PT Start Time 1033   PT Stop Time 2595   PT Time Calculation (min) 58 min      Past Medical History  Diagnosis Date  . Arthritis     DJD  . Hyperlipidemia   . GERD (gastroesophageal reflux disease)   . Osteoporosis, senile   . Diverticulosis   . Scoliosis   . Peripheral neuropathy   . Hx of cardiovascular stress test     Lexiscan Myoview (2/14):  Low risk, small ant defect likely breast attenuation, no ischemia, EF 69% (no change from 2010).    . Mitral regurgitation     Echo (2/14):  EF 60-65%, Gr 1 DD, mild AI, mild bileaflet MVP, mod post directed MR, mild LAE, PASP 35.  Marland Kitchen MVP (mitral valve prolapse)   . Shoulder pain left    to have surgery 10/02/14 for rotator cuff repair    Past Surgical History  Procedure Laterality Date  . Tonsillectomy    . Vaginal hysterectomy      For Uterine Deviation   . 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 Cholecystectomy  03/2010    Dr.Gross  . Lumpectomy  06/2009    "Fatty Necrosis"  . Breast surgery  Nov 2010    Benign biopsy, right  . Cystocele repair    . Combined hysterectomy vaginal / oophorectomy / a&p repair  6387    uncertain if ovaries were removed or not    There were no vitals filed for this visit.  Visit Diagnosis:  Left shoulder pain  Shoulder stiffness, left      Subjective Assessment - 10/24/14 1105    Symptoms Left shld not as sore at rest.  certain movements cause increased pain. Pain varies from 3/10 to 5/10. continues to wear sling.   Limitations  Lifting;House hold activities   Currently in Pain? Yes   Pain Score 3    Pain Location Shoulder   Pain Orientation Left;Proximal   Pain Descriptors / Indicators Aching;Sore;Tightness;Constant   Pain Type Surgical pain   Pain Onset 1 to 4 weeks ago   Pain Frequency Intermittent   Aggravating Factors  certain movements   Pain Relieving Factors meds and rest   Effect of Pain on Daily Activities unable to do right now   Multiple Pain Sites No                       [facility] Adult PT Treatment/Exercise - 10/24/14 0001    Manual Therapy   Passive ROM --  PROM to LT shldr with low load long holds flex  and ER hold                   PT Short Term Goals - 10/09/14 1157    PT SHORT TERM GOAL #1   Title STG's=LTG's.           PT Long Term Goals - 10/18/14 1114    PT LONG TERM GOAL #1  Title Independent with an advanced HEP.   Time 4   Period Weeks   Status On-going   PT LONG TERM GOAL #2   Title Active left shoulder flexion to 150 degrees so the patient can easily reach overhead   Time 4   Period Weeks   Status On-going   PT LONG TERM GOAL #3   Title Active ER to 70 degrees+ to allow for easily donning/doffing of apparel   Time 4   Period Weeks   Status New   PT LONG TERM GOAL #4   Title Increase ROM so patient is able to reach behind back to L3 with left hand   Time 4   Period Weeks   Status On-going   PT LONG TERM GOAL #5   Title Increase left shoulder strength to a solid 4+/5 to increase stability for performance of functional activities   Time 4   Period Weeks   Status On-going   PT LONG TERM GOAL #6   Title Perform ADL's with pain not > 2-3/10.   Time 4   Period Weeks   Status On-going               Plan - 10/24/14 1141    Clinical Impression Statement Patient wearing sling and still has soreness especially with certain movements. Pain varies...3--5/10.Marland KitchenMarland KitchenPain meds help   Pt will benefit from skilled therapeutic intervention in  order to improve on the following deficits Decreased strength;Pain;Impaired UE functional use;Decreased range of motion   Rehab Potential Excellent   PT Frequency 3x / week   PT Duration 4 weeks   PT Treatment/Interventions Electrical Stimulation;Moist Heat;Passive range of motion   PT Next Visit Plan cont PROM---modalities   PT Home Exercise Plan pendulum   Consulted and Agree with Plan of Care Patient        Problem List Patient Active Problem List   Diagnosis Date Noted  . Acute bronchitis 09/08/2014  . Mitral insufficiency 05/12/2014  . HTN (hypertension) 04/28/2014  . Lactic acidosis 09/07/2013  . Mitral valve prolapse 06/14/2013  . Chest pain, exertional 09/13/2012  . Pernicious anemia 09/07/2012  . DIVERTICULOSIS, COLON 09/06/2009  . Osteoporosis 09/06/2009  . HYPERLIPIDEMIA 09/04/2009  . PERIPHERAL NEUROPATHY 09/04/2009  . LOW BACK PAIN, CHRONIC 04/08/2007    Fabienne Bruns P,PTA 10/24/2014, 11:52 AM  [facility] [address] [place], [place], [address] Phone: [phone]   Fax:  [phone]

## 2014-10-26 ENCOUNTER — Ambulatory Visit: Payer: Medicare Other | Admitting: *Deleted

## 2014-10-26 DIAGNOSIS — M25512 Pain in left shoulder: Secondary | ICD-10-CM

## 2014-10-26 DIAGNOSIS — M25612 Stiffness of left shoulder, not elsewhere classified: Secondary | ICD-10-CM | POA: Diagnosis not present

## 2014-10-26 NOTE — Therapy (Signed)
[facility] [place], [place], [address] Phone: [phone]   Fax:  [phone]  Physical Therapy Treatment  Patient Details  Name: Cassie White MRN: [id number] Date of Birth: 1941/03/11 Referring Provider:  Hendricks Limes, MD  Encounter Date: 10/26/2014      PT End of Session - 10/26/14 1053    Visit Number 6   Number of Visits 12   Date for PT Re-Evaluation 11/06/14   PT Start Time 1031      Past Medical History  Diagnosis Date  . Arthritis     DJD  . Hyperlipidemia   . GERD (gastroesophageal reflux disease)   . Osteoporosis, senile   . Diverticulosis   . Scoliosis   . Peripheral neuropathy   . Hx of cardiovascular stress test     Lexiscan Myoview (2/14):  Low risk, small ant defect likely breast attenuation, no ischemia, EF 69% (no change from 2010).    . Mitral regurgitation     Echo (2/14):  EF 60-65%, Gr 1 DD, mild AI, mild bileaflet MVP, mod post directed MR, mild LAE, PASP 35.  Marland Kitchen MVP (mitral valve prolapse)   . Shoulder pain left    to have surgery 10/02/14 for rotator cuff repair    Past Surgical History  Procedure Laterality Date  . Tonsillectomy    . Vaginal hysterectomy      For Uterine Deviation   . 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 Cholecystectomy  03/2010    Dr.Gross  . Lumpectomy  06/2009    "Fatty Necrosis"  . Breast surgery  Nov 2010    Benign biopsy, right  . Cystocele repair    . Combined hysterectomy vaginal / oophorectomy / a&p repair  0932    uncertain if ovaries were removed or not    There were no vitals filed for this visit.  Visit Diagnosis:  Left shoulder pain  Shoulder stiffness, left      Subjective Assessment - 10/26/14 1052    Symptoms Left shld not as sore at rest.  certain movements cause increased pain. Pain varies from 3/10 to 5/10. continues to wear sling.   Limitations Lifting;House hold activities   Currently in Pain? Yes   Pain  Score 2    Pain Location Shoulder   Pain Orientation Right   Pain Descriptors / Indicators Aching   Pain Type Surgical pain   Pain Onset 1 to 4 weeks ago   Pain Frequency Intermittent   Aggravating Factors  certain mvmnts   Effect of Pain on Daily Activities rest, meds            [facility] PT Assessment - 10/26/14 0001    ROM / Strength   AROM / PROM / Strength PROM   PROM   Overall PROM  Deficits   Overall PROM Comments 150   PROM Assessment Site Shoulder   Right/Left Shoulder Left   Left Shoulder External Rotation 40 Degrees                   [facility] Adult PT Treatment/Exercise - 10/26/14 0001    Manual Therapy   Manual Therapy Passive ROM   Passive ROM Left shoulder passive-assistive ROM into flexion and ER /IR to abdomen and scapular mobs                PT Education - 10/26/14 1120    Education provided Yes   Person(s) Educated  Patient   Methods Explanation;Demonstration;Tactile cues;Verbal cues   Comprehension Verbalized understanding;Returned demonstration;Verbal cues required          PT Short Term Goals - 10/09/14 1157    PT SHORT TERM GOAL #1   Title STG's=LTG's.           PT Long Term Goals - 10/18/14 1114    PT LONG TERM GOAL #1   Title Independent with an advanced HEP.   Time 4   Period Weeks   Status On-going   PT LONG TERM GOAL #2   Title Active left shoulder flexion to 150 degrees so the patient can easily reach overhead   Time 4   Period Weeks   Status On-going   PT LONG TERM GOAL #3   Title Active ER to 70 degrees+ to allow for easily donning/doffing of apparel   Time 4   Period Weeks   Status New   PT LONG TERM GOAL #4   Title Increase ROM so patient is able to reach behind back to L3 with left hand   Time 4   Period Weeks   Status On-going   PT LONG TERM GOAL #5   Title Increase left shoulder strength to a solid 4+/5 to increase stability for performance of functional activities   Time 4   Period Weeks    Status On-going   PT LONG TERM GOAL #6   Title Perform ADL's with pain not > 2-3/10.   Time 4   Period Weeks   Status On-going               Plan - 10/26/14 1118    Clinical Impression Statement pt did great with Rx and was able to relax better today   Pt will benefit from skilled therapeutic intervention in order to improve on the following deficits Decreased strength;Pain;Impaired UE functional use;Decreased range of motion   Rehab Potential Excellent   PT Frequency 3x / week   PT Duration 4 weeks   PT Treatment/Interventions Electrical Stimulation;Moist Heat;Passive range of motion   PT Next Visit Plan cont PROM---modalities   PT Home Exercise Plan pendulum, PROM cane for er   Consulted and Agree with Plan of Care Patient        Problem List Patient Active Problem List   Diagnosis Date Noted  . Acute bronchitis 09/08/2014  . Mitral insufficiency 05/12/2014  . HTN (hypertension) 04/28/2014  . Lactic acidosis 09/07/2013  . Mitral valve prolapse 06/14/2013  . Chest pain, exertional 09/13/2012  . Pernicious anemia 09/07/2012  . DIVERTICULOSIS, COLON 09/06/2009  . Osteoporosis 09/06/2009  . HYPERLIPIDEMIA 09/04/2009  . PERIPHERAL NEUROPATHY 09/04/2009  . LOW BACK PAIN, CHRONIC 04/08/2007    Kylie Simmonds,CHRIS,PTA 10/26/2014, 12:15 PM  [facility] [address] [place], [place], [address] Phone: [phone]   Fax:  [phone]

## 2014-10-26 NOTE — Patient Instructions (Addendum)
ROM: External / Internal Rotation - Wand   Holding wand with left hand palm up, push out from body with other hand, palm down. Keep both elbows bent. When stretch is felt, hold __5-10__ seconds. Repeat to other side, leading with same hand. Keep elbows bent. Repeat _10___ times per set. Do _2___ sets per session. Do _2-3___ sessions per day.  http://orth.exer.us/748   Copyright  VHI. All rights reserved.

## 2014-10-31 ENCOUNTER — Ambulatory Visit: Payer: Medicare Other | Admitting: Physical Therapy

## 2014-10-31 ENCOUNTER — Encounter: Payer: Self-pay | Admitting: Physical Therapy

## 2014-10-31 DIAGNOSIS — M25512 Pain in left shoulder: Secondary | ICD-10-CM

## 2014-10-31 DIAGNOSIS — M25612 Stiffness of left shoulder, not elsewhere classified: Secondary | ICD-10-CM | POA: Diagnosis not present

## 2014-10-31 NOTE — Therapy (Signed)
[facility] [place], [place], [address] Phone: [phone]   Fax:  [phone]  Physical Therapy Treatment  Patient Details  Name: Cassie White MRN: [id number] Date of Birth: 09/10/1940 Referring Provider:  Hendricks Limes, MD  Encounter Date: 10/31/2014      PT End of Session - 10/31/14 1134    Visit Number 7   Number of Visits 12   Date for PT Re-Evaluation 11/06/14   PT Start Time 1115   PT Stop Time 1140   PT Time Calculation (min) 25 min   Activity Tolerance Patient tolerated treatment well   Behavior During Therapy [facility] for tasks assessed/performed      Past Medical History  Diagnosis Date  . Arthritis     DJD  . Hyperlipidemia   . GERD (gastroesophageal reflux disease)   . Osteoporosis, senile   . Diverticulosis   . Scoliosis   . Peripheral neuropathy   . Hx of cardiovascular stress test     Lexiscan Myoview (2/14):  Low risk, small ant defect likely breast attenuation, no ischemia, EF 69% (no change from 2010).    . Mitral regurgitation     Echo (2/14):  EF 60-65%, Gr 1 DD, mild AI, mild bileaflet MVP, mod post directed MR, mild LAE, PASP 35.  Marland Kitchen MVP (mitral valve prolapse)   . Shoulder pain left    to have surgery 10/02/14 for rotator cuff repair    Past Surgical History  Procedure Laterality Date  . Tonsillectomy    . Vaginal hysterectomy      For Uterine Deviation   . 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 Cholecystectomy  03/2010    Dr.Gross  . Lumpectomy  06/2009    "Fatty Necrosis"  . Breast surgery  Nov 2010    Benign biopsy, right  . Cystocele repair    . Combined hysterectomy vaginal / oophorectomy / a&p repair  1941    uncertain if ovaries were removed or not    There were no vitals filed for this visit.  Visit Diagnosis:  Left shoulder pain  Shoulder stiffness, left      Subjective Assessment - 10/31/14 1120    Symptoms no new complaints only soreness   Currently in Pain? Yes   Pain Score 2    Pain Location Shoulder   Pain Orientation Right   Pain Descriptors / Indicators Aching;Sore   Pain Type Surgical pain   Pain Onset 1 to 4 weeks ago   Aggravating Factors  movement   Pain Relieving Factors rest            [facility] PT Assessment - 10/31/14 0001    PROM   Overall PROM  Deficits   PROM Assessment Site Shoulder   Right/Left Shoulder Left   Left Shoulder Flexion 140 Degrees   Left Shoulder External Rotation 50 Degrees                   [facility] Adult PT Treatment/Exercise - 10/31/14 0001    Manual Therapy   Manual Therapy Passive ROM   Passive ROM Left shoulder passive-assistive ROM into flexion and ER with gentle range                  PT Short Term Goals - 10/09/14 1157    PT SHORT TERM GOAL #1   Title STG's=LTG's.           PT Long Term  Goals - 10/18/14 1114    PT LONG TERM GOAL #1   Title Independent with an advanced HEP.   Time 4   Period Weeks   Status On-going   PT LONG TERM GOAL #2   Title Active left shoulder flexion to 150 degrees so the patient can easily reach overhead   Time 4   Period Weeks   Status On-going   PT LONG TERM GOAL #3   Title Active ER to 70 degrees+ to allow for easily donning/doffing of apparel   Time 4   Period Weeks   Status New   PT LONG TERM GOAL #4   Title Increase ROM so patient is able to reach behind back to L3 with left hand   Time 4   Period Weeks   Status On-going   PT LONG TERM GOAL #5   Title Increase left shoulder strength to a solid 4+/5 to increase stability for performance of functional activities   Time 4   Period Weeks   Status On-going   PT LONG TERM GOAL #6   Title Perform ADL's with pain not > 2-3/10.   Time 4   Period Weeks   Status On-going               Plan - 10/31/14 1141    Clinical Impression Statement tolerated tx well with no pain and imporoved P/AAROM today. Goals ongoing   Pt will benefit from skilled  therapeutic intervention in order to improve on the following deficits Decreased strength;Pain;Impaired UE functional use;Decreased range of motion   Rehab Potential Excellent   PT Frequency 3x / week   PT Duration 4 weeks   PT Treatment/Interventions Electrical Stimulation;Moist Heat;Passive range of motion   PT Next Visit Plan cont PROM (MD Veverly Fells on April 7th)   Consulted and Agree with Plan of Care Patient        Problem List Patient Active Problem List   Diagnosis Date Noted  . Acute bronchitis 09/08/2014  . Mitral insufficiency 05/12/2014  . HTN (hypertension) 04/28/2014  . Lactic acidosis 09/07/2013  . Mitral valve prolapse 06/14/2013  . Chest pain, exertional 09/13/2012  . Pernicious anemia 09/07/2012  . DIVERTICULOSIS, COLON 09/06/2009  . Osteoporosis 09/06/2009  . HYPERLIPIDEMIA 09/04/2009  . PERIPHERAL NEUROPATHY 09/04/2009  . LOW BACK PAIN, CHRONIC 04/08/2007    Ladean Raya P,PTA 10/31/2014, 11:43 AM  [facility] [address] [place], [place], [address] Phone: [phone]   Fax:  [phone]

## 2014-11-02 ENCOUNTER — Ambulatory Visit: Payer: Medicare Other | Admitting: Physical Therapy

## 2014-11-02 ENCOUNTER — Encounter: Payer: Self-pay | Admitting: Physical Therapy

## 2014-11-02 DIAGNOSIS — M25612 Stiffness of left shoulder, not elsewhere classified: Secondary | ICD-10-CM | POA: Diagnosis not present

## 2014-11-02 DIAGNOSIS — M25512 Pain in left shoulder: Secondary | ICD-10-CM | POA: Diagnosis not present

## 2014-11-02 NOTE — Therapy (Signed)
[facility] [place], [place], [address] Phone: [phone]   Fax:  [phone]  Physical Therapy Treatment  Patient Details  Name: Cassie White MRN: [id number] Date of Birth: 25-Dec-1940 Referring Provider:  Hendricks Limes, MD  Encounter Date: 11/02/2014      PT End of Session - 11/02/14 1030    Visit Number 8   Number of Visits 12   Date for PT Re-Evaluation 11/06/14   PT Start Time 6294   PT Stop Time 1110   PT Time Calculation (min) 42 min      Past Medical History  Diagnosis Date  . Arthritis     DJD  . Hyperlipidemia   . GERD (gastroesophageal reflux disease)   . Osteoporosis, senile   . Diverticulosis   . Scoliosis   . Peripheral neuropathy   . Hx of cardiovascular stress test     Lexiscan Myoview (2/14):  Low risk, small ant defect likely breast attenuation, no ischemia, EF 69% (no change from 2010).    . Mitral regurgitation     Echo (2/14):  EF 60-65%, Gr 1 DD, mild AI, mild bileaflet MVP, mod post directed MR, mild LAE, PASP 35.  Marland Kitchen MVP (mitral valve prolapse)   . Shoulder pain left    to have surgery 10/02/14 for rotator cuff repair    Past Surgical History  Procedure Laterality Date  . Tonsillectomy    . Vaginal hysterectomy      For Uterine Deviation   . 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 Cholecystectomy  03/2010    Dr.Gross  . Lumpectomy  06/2009    "Fatty Necrosis"  . Breast surgery  Nov 2010    Benign biopsy, right  . Cystocele repair    . Combined hysterectomy vaginal / oophorectomy / a&p repair  7654    uncertain if ovaries were removed or not    There were no vitals filed for this visit.  Visit Diagnosis:  Left shoulder pain  Shoulder stiffness, left      Subjective Assessment - 11/02/14 1029    Symptoms States that shoulder has been good only reports soreness.   Limitations Lifting;House hold activities   Currently in Pain? No/denies             [facility] PT Assessment - 11/02/14 0001    Assessment   Medical Diagnosis S/P Rotator Cuff Surgery.   Onset Date --  January 2016   Next MD Visit 11/09/2014   ROM / Strength   AROM / PROM / Strength PROM   PROM   Overall PROM  Deficits   Overall PROM Comments scaption 139 degrees   PROM Assessment Site Shoulder   Right/Left Shoulder Left   Left Shoulder Flexion 150 Degrees   Left Shoulder Internal Rotation 61 Degrees   Left Shoulder External Rotation 50 Degrees                   [facility] Adult PT Treatment/Exercise - 11/02/14 0001    Manual Therapy   Manual Therapy Passive ROM   Passive ROM L shoulder PROM/AAROM into flexion/scaption/ ER/IR with gentle holds at end range                  PT Short Term Goals - 10/09/14 1157    PT SHORT TERM GOAL #1   Title STG's=LTG's.           PT Long Term Goals -  11/02/14 1108    PT LONG TERM GOAL #1   Title Independent with an advanced HEP.   Time 4   Period Weeks   Status On-going   PT LONG TERM GOAL #2   Title Active left shoulder flexion to 150 degrees so the patient can easily reach overhead   Time 4   Period Weeks   Status On-going  11-02-14 150 degrees PROM   PT LONG TERM GOAL #3   Title Active ER to 70 degrees+ to allow for easily donning/doffing of apparel   Time 4   Period Weeks   Status On-going  11-02-2014 50 degrees PROM   PT LONG TERM GOAL #4   Title Increase ROM so patient is able to reach behind back to L3 with left hand   Time 4   Period Weeks   Status On-going   PT LONG TERM GOAL #5   Title Increase left shoulder strength to a solid 4+/5 to increase stability for performance of functional activities   Time 4   Period Weeks   Status On-going   PT LONG TERM GOAL #6   Title Perform ADL's with pain not > 2-3/10.   Time 4   Period Weeks   Status Achieved               Plan - 11/02/14 1116    Clinical Impression Statement Patient tolerated treatment well with no pain reported  during PROM/AAROM. Improved PROM measurements as noted today. Denied pain and soreness following treatment. Achieved goal #6 of completing ADLs with pain <2-3/10.    Pt will benefit from skilled therapeutic intervention in order to improve on the following deficits Decreased strength;Pain;Impaired UE functional use;Decreased range of motion   Rehab Potential Excellent   PT Frequency 3x / week   PT Duration 4 weeks   PT Treatment/Interventions Electrical Stimulation;Moist Heat;Passive range of motion   PT Next Visit Plan Continue PROM per PT POC. Patient has appt with Dr. Veverly Fells 11-09-2014   Consulted and Agree with Plan of Care Patient        Problem List Patient Active Problem List   Diagnosis Date Noted  . Acute bronchitis 09/08/2014  . Mitral insufficiency 05/12/2014  . HTN (hypertension) 04/28/2014  . Lactic acidosis 09/07/2013  . Mitral valve prolapse 06/14/2013  . Chest pain, exertional 09/13/2012  . Pernicious anemia 09/07/2012  . DIVERTICULOSIS, COLON 09/06/2009  . Osteoporosis 09/06/2009  . HYPERLIPIDEMIA 09/04/2009  . PERIPHERAL NEUROPATHY 09/04/2009  . LOW BACK PAIN, CHRONIC 04/08/2007    Wynelle Fanny, PTA 11/02/2014, 11:27 AM  [facility] [address] [place], [place], [address] Phone: [phone]   Fax:  [phone]

## 2014-11-06 ENCOUNTER — Ambulatory Visit: Payer: Medicare Other | Attending: Orthopedic Surgery | Admitting: Physical Therapy

## 2014-11-06 ENCOUNTER — Encounter: Payer: Self-pay | Admitting: Physical Therapy

## 2014-11-06 DIAGNOSIS — M25612 Stiffness of left shoulder, not elsewhere classified: Secondary | ICD-10-CM | POA: Diagnosis not present

## 2014-11-06 DIAGNOSIS — M25512 Pain in left shoulder: Secondary | ICD-10-CM | POA: Insufficient documentation

## 2014-11-06 NOTE — Therapy (Signed)
[facility] [place], [place], [address] Phone: [phone]   Fax:  [phone]  Physical Therapy Treatment  Patient Details  Name: Cassie White MRN: [id number] Date of Birth: [birth year] Referring Provider:  Hendricks Limes, MD  Encounter Date: 11/06/2014      PT End of Session - 11/06/14 1040    Visit Number 9   Number of Visits 12   Date for PT Re-Evaluation 11/06/14   PT Start Time 1031   PT Stop Time 1115   PT Time Calculation (min) 44 min   Activity Tolerance Patient tolerated treatment well   Behavior During Therapy [facility] for tasks assessed/performed      Past Medical History  Diagnosis Date  . Arthritis     DJD  . Hyperlipidemia   . GERD (gastroesophageal reflux disease)   . Osteoporosis, senile   . Diverticulosis   . Scoliosis   . Peripheral neuropathy   . Hx of cardiovascular stress test     Lexiscan Myoview (2/14):  Low risk, small ant defect likely breast attenuation, no ischemia, EF 69% (no change from 2010).    . Mitral regurgitation     Echo (2/14):  EF 60-65%, Gr 1 DD, mild AI, mild bileaflet MVP, mod post directed MR, mild LAE, PASP 35.  Marland Kitchen MVP (mitral valve prolapse)   . Shoulder pain left    to have surgery 10/02/14 for rotator cuff repair    Past Surgical History  Procedure Laterality Date  . Tonsillectomy    . Vaginal hysterectomy      For Uterine Deviation   . 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 Cholecystectomy  03/2010    Dr.Gross  . Lumpectomy  06/2009    "Fatty Necrosis"  . Breast surgery  Nov 2010    Benign biopsy, right  . Cystocele repair    . Combined hysterectomy vaginal / oophorectomy / a&p repair  1007    uncertain if ovaries were removed or not    There were no vitals filed for this visit.  Visit Diagnosis:  Left shoulder pain  Shoulder stiffness, left      Subjective Assessment - 11/06/14 1039    Subjective Had no problems over the weekend.  Only reports soreness in L shoulder. Did not have sling donned at beginning of appointment.    Limitations Lifting;House hold activities   Currently in Pain? No/denies            [facility] PT Assessment - 11/06/14 0001    Assessment   Medical Diagnosis S/P Rotator Cuff Surgery.   Next MD Visit 11/09/2014   PROM   Overall PROM  Deficits   Overall PROM Comments scaption 141 degrees   PROM Assessment Site Shoulder   Right/Left Shoulder Left   Left Shoulder Flexion 150 Degrees   Left Shoulder Internal Rotation 70 Degrees   Left Shoulder External Rotation 56 Degrees                   [facility] Adult PT Treatment/Exercise - 11/06/14 0001    Exercises   Exercises Shoulder   Shoulder Exercises: Supine   External Rotation AAROM;20 reps  Supine   Flexion AAROM;20 reps  Supine   Other Supine Exercises AAROM Chest press x 20 reps   Manual Therapy   Manual Therapy Passive ROM   Passive ROM L shoulder PROM into scaption/ IR with gentle hold at end range  PT Short Term Goals - 10/09/14 1157    PT SHORT TERM GOAL #1   Title STG's=LTG's.           PT Long Term Goals - 11/06/14 1117    PT LONG TERM GOAL #1   Title Independent with an advanced HEP.   Time 4   Period Weeks   Status On-going   PT LONG TERM GOAL #2   Title Active left shoulder flexion to 150 degrees so the patient can easily reach overhead   Time 4   Period Weeks   Status Partially Met  L shoulder PROM flexion 150 degrees   PT LONG TERM GOAL #3   Title Active ER to 70 degrees+ to allow for easily donning/doffing of apparel   Time 4   Period Weeks   Status On-going   PT LONG TERM GOAL #4   Title Increase ROM so patient is able to reach behind back to L3 with left hand   Time 4   Period Weeks   Status On-going  11-06-14 could reach L4   PT LONG TERM GOAL #5   Title Increase left shoulder strength to a solid 4+/5 to increase stability for performance of functional activities    Time 4   Period Weeks   Status On-going   PT LONG TERM GOAL #6   Title Perform ADL's with pain not > 2-3/10.   Time 4   Period Weeks   Status Achieved               Plan - 11/06/14 1045    Clinical Impression Statement Patient tolerated AAROM exercises well with soreness reported following exercises. PROM measurements improved as noted. Denied pain following treatment. Partially achieved goal #2 of 150 degrees of PROM into flexion.    Pt will benefit from skilled therapeutic intervention in order to improve on the following deficits Decreased strength;Pain;Impaired UE functional use;Decreased range of motion   Rehab Potential Excellent   PT Frequency 3x / week   PT Duration 4 weeks   PT Treatment/Interventions Electrical Stimulation;Moist Heat;Passive range of motion   PT Next Visit Plan Continue PROM/ AAROM per PT POC. Patient has appt with Dr. Veverly Fells 11-09-2014   PT Home Exercise Plan pendulum, PROM cane for er        Problem List Patient Active Problem List   Diagnosis Date Noted  . Acute bronchitis 09/08/2014  . Mitral insufficiency 05/12/2014  . HTN (hypertension) 04/28/2014  . Lactic acidosis 09/07/2013  . Mitral valve prolapse 06/14/2013  . Chest pain, exertional 09/13/2012  . Pernicious anemia 09/07/2012  . DIVERTICULOSIS, COLON 09/06/2009  . Osteoporosis 09/06/2009  . HYPERLIPIDEMIA 09/04/2009  . PERIPHERAL NEUROPATHY 09/04/2009  . LOW BACK PAIN, CHRONIC 04/08/2007    Wynelle Fanny, PTA 11/06/2014, 11:23 AM  [facility] [address] [place], [place], [address] Phone: [phone]   Fax:  [phone]

## 2014-11-08 ENCOUNTER — Ambulatory Visit: Payer: Medicare Other | Admitting: Physical Therapy

## 2014-11-08 DIAGNOSIS — M25612 Stiffness of left shoulder, not elsewhere classified: Secondary | ICD-10-CM | POA: Diagnosis not present

## 2014-11-08 DIAGNOSIS — M25512 Pain in left shoulder: Secondary | ICD-10-CM | POA: Diagnosis not present

## 2014-11-08 NOTE — Therapy (Signed)
[facility] [place], [place], [address] Phone: [phone]   Fax:  [phone]  Physical Therapy Treatment  Patient Details  Name: Cassie White MRN: [id number] Date of Birth: 15-Apr-1941 Referring Provider:  Hendricks Limes, MD  Encounter Date: 11/08/2014      PT End of Session - 11/08/14 1116    Visit Number 10   Number of Visits 12   Date for PT Re-Evaluation 11/06/14   PT Start Time 1032   PT Stop Time 1117   PT Time Calculation (min) 45 min   Activity Tolerance Patient tolerated treatment well   Behavior During Therapy [facility] for tasks assessed/performed      Past Medical History  Diagnosis Date  . Arthritis     DJD  . Hyperlipidemia   . GERD (gastroesophageal reflux disease)   . Osteoporosis, senile   . Diverticulosis   . Scoliosis   . Peripheral neuropathy   . Hx of cardiovascular stress test     Lexiscan Myoview (2/14):  Low risk, small ant defect likely breast attenuation, no ischemia, EF 69% (no change from 2010).    . Mitral regurgitation     Echo (2/14):  EF 60-65%, Gr 1 DD, mild AI, mild bileaflet MVP, mod post directed MR, mild LAE, PASP 35.  Marland Kitchen MVP (mitral valve prolapse)   . Shoulder pain left    to have surgery 10/02/14 for rotator cuff repair    Past Surgical History  Procedure Laterality Date  . Tonsillectomy    . Vaginal hysterectomy      For Uterine Deviation   . 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 Cholecystectomy  03/2010    Dr.Gross  . Lumpectomy  06/2009    "Fatty Necrosis"  . Breast surgery  Nov 2010    Benign biopsy, right  . Cystocele repair    . Combined hysterectomy vaginal / oophorectomy / a&p repair  5329    uncertain if ovaries were removed or not    There were no vitals filed for this visit.  Visit Diagnosis:  Left shoulder pain  Shoulder stiffness, left      Subjective Assessment - 11/08/14 1049    Subjective Shoulder feels good today and  can tell of progress being made.  Sling not donned for appointment.   Limitations Lifting;House hold activities   Currently in Pain? No/denies            [facility] PT Assessment - 11/08/14 0001    Assessment   Medical Diagnosis S/P Rotator Cuff Surgery.   Next MD Visit 11/09/2014   ROM / Strength   AROM / PROM / Strength AROM   AROM   Overall AROM  Deficits   Overall AROM Comments Scaption 170   AROM Assessment Site Shoulder   Right/Left Shoulder Left   Left Shoulder Flexion 142 Degrees   Left Shoulder Internal Rotation 71 Degrees   Left Shoulder External Rotation 73 Degrees                   [facility] Adult PT Treatment/Exercise - 11/08/14 0001    Shoulder Exercises: Supine   External Rotation AAROM;Other (comment)  30 reps   Flexion AAROM  30 reps   Other Supine Exercises AAROM Chest press x 30 reps   Shoulder Exercises: Seated   Flexion AAROM;Other (comment)  with blue ball on table x 30 reps   Other Seated Exercises Seated ball roll clockwise  30 reps, counterclockwise 30 reps   Shoulder Exercises: ROM/Strengthening   Rhythmic Stabilization, Supine Flexion 10x 30 sec, ER/IR 10x 30sec   Manual Therapy   Manual Therapy Passive ROM   Passive ROM L shoulder PROM into scaption/ IR with gentle hold at end range                  PT Short Term Goals - 10/09/14 1157    PT SHORT TERM GOAL #1   Title STG's=LTG's.           PT Long Term Goals - 11/08/14 1111    PT LONG TERM GOAL #1   Title Independent with an advanced HEP.   Time 4   Period Weeks   Status On-going  Patient reports that she does "some" of the exercises   PT LONG TERM GOAL #2   Title Active left shoulder flexion to 150 degrees so the patient can easily reach overhead   Time 4   Period Weeks   Status On-going  L shoulder AROM flexion 142 deg on 11/08/2014   PT LONG TERM GOAL #3   Title Active ER to 70 degrees+ to allow for easily donning/doffing of apparel   Time 4   Period Weeks    Status Achieved  L shoulder AROM ER 73 deg as of 11/08/2014   PT LONG TERM GOAL #4   Title Increase ROM so patient is able to reach behind back to L3 with left hand   Time 4   Period Weeks   Status Achieved  Can reach into upper lumbar vertebrae   PT LONG TERM GOAL #5   Title Increase left shoulder strength to a solid 4+/5 to increase stability for performance of functional activities   Time 4   Period Weeks   Status On-going   PT LONG TERM GOAL #6   Title Perform ADL's with pain not > 2-3/10.   Time 4   Period Weeks   Status Achieved               Plan - 11/08/14 1117    Clinical Impression Statement Patient tolerated AAROM exercises well with no complaints of pain throughout treatment. AROM measurements have improved. Patient has met her ER, IR and ADL goal this far in PT. Has MD appt 11/09/2014.   Pt will benefit from skilled therapeutic intervention in order to improve on the following deficits Decreased strength;Pain;Impaired UE functional use;Decreased range of motion   Rehab Potential Excellent   PT Frequency 3x / week   PT Duration 4 weeks   PT Treatment/Interventions Electrical Stimulation;Moist Heat;Passive range of motion   PT Next Visit Plan Continue PROM/ AAROM per PT POC. Patient has appt with Dr. Veverly Fells 11-09-2014   PT Home Exercise Plan pendulum, PROM cane for er   Consulted and Agree with Plan of Care Patient        Problem List Patient Active Problem List   Diagnosis Date Noted  . Acute bronchitis 09/08/2014  . Mitral insufficiency 05/12/2014  . HTN (hypertension) 04/28/2014  . Lactic acidosis 09/07/2013  . Mitral valve prolapse 06/14/2013  . Chest pain, exertional 09/13/2012  . Pernicious anemia 09/07/2012  . DIVERTICULOSIS, COLON 09/06/2009  . Osteoporosis 09/06/2009  . HYPERLIPIDEMIA 09/04/2009  . PERIPHERAL NEUROPATHY 09/04/2009  . LOW BACK PAIN, CHRONIC 04/08/2007    Wynelle Fanny, PTA 11/08/2014, 11:26 AM  [facility] [address] [place], [place], [address] Phone: [phone]   Fax:  [phone]      

## 2014-11-09 DIAGNOSIS — Z4789 Encounter for other orthopedic aftercare: Secondary | ICD-10-CM | POA: Diagnosis not present

## 2014-11-09 DIAGNOSIS — M75122 Complete rotator cuff tear or rupture of left shoulder, not specified as traumatic: Secondary | ICD-10-CM | POA: Diagnosis not present

## 2014-11-14 ENCOUNTER — Ambulatory Visit: Payer: Medicare Other | Admitting: Physical Therapy

## 2014-11-14 DIAGNOSIS — M25612 Stiffness of left shoulder, not elsewhere classified: Secondary | ICD-10-CM | POA: Diagnosis not present

## 2014-11-14 DIAGNOSIS — M25512 Pain in left shoulder: Secondary | ICD-10-CM | POA: Diagnosis not present

## 2014-11-14 NOTE — Therapy (Signed)
[facility] [place], [place], [address] Phone: [phone]   Fax:  [phone]  Physical Therapy Treatment  Patient Details  Name: Cassie White MRN: [id number] Date of Birth: 04/02/1941 Referring Provider:  Hendricks Limes, MD  Encounter Date: 11/14/2014      PT End of Session - 11/14/14 1038    Visit Number 11   Number of Visits 20   Date for PT Re-Evaluation 12/07/14   PT Start Time 1031   PT Stop Time 1120   PT Time Calculation (min) 49 min   Activity Tolerance Patient tolerated treatment well   Behavior During Therapy [facility] for tasks assessed/performed      Past Medical History  Diagnosis Date  . Arthritis     DJD  . Hyperlipidemia   . GERD (gastroesophageal reflux disease)   . Osteoporosis, senile   . Diverticulosis   . Scoliosis   . Peripheral neuropathy   . Hx of cardiovascular stress test     Lexiscan Myoview (2/14):  Low risk, small ant defect likely breast attenuation, no ischemia, EF 69% (no change from 2010).    . Mitral regurgitation     Echo (2/14):  EF 60-65%, Gr 1 DD, mild AI, mild bileaflet MVP, mod post directed MR, mild LAE, PASP 35.  Marland Kitchen MVP (mitral valve prolapse)   . Shoulder pain left    to have surgery 10/02/14 for rotator cuff repair    Past Surgical History  Procedure Laterality Date  . Tonsillectomy    . Vaginal hysterectomy      For Uterine Deviation   . 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 Cholecystectomy  03/2010    Dr.Gross  . Lumpectomy  06/2009    "Fatty Necrosis"  . Breast surgery  Nov 2010    Benign biopsy, right  . Cystocele repair    . Combined hysterectomy vaginal / oophorectomy / a&p repair  0347    uncertain if ovaries were removed or not    There were no vitals filed for this visit.  Visit Diagnosis:  Left shoulder pain  Shoulder stiffness, left      Subjective Assessment - 11/14/14 1038    Subjective States that MD was pleased with  her progress and to come back in one month. Woke up this morning with her L arm hurting which she attributes to sleeping in her recliner. Took pain medication prior to therapy. States that she may do more than she is supposed to at home like sweeping floors and driving.   Limitations Lifting;House hold activities   Currently in Pain? Yes   Pain Score 2    Pain Location Shoulder   Pain Orientation Left   Pain Descriptors / Indicators Aching   Pain Type Surgical pain   Pain Onset 1 to 4 weeks ago   Pain Frequency Intermittent   Aggravating Factors  movement   Pain Relieving Factors rest            [facility] PT Assessment - 11/14/14 0001    Assessment   Medical Diagnosis S/P Rotator Cuff Surgery.   Next MD Visit 12/07/2014                   [facility] Adult PT Treatment/Exercise - 11/14/14 0001    Shoulder Exercises: Supine   External Rotation AAROM;Left  x30 reps   Flexion AAROM;Left  x30 reps   Other Supine Exercises AAROM Chest press x  30 reps   Shoulder Exercises: Seated   Other Seated Exercises Seated ball rolls up/down, side/side, CW circles x30 reps   Shoulder Exercises: Standing   Protraction Strengthening;Left;Theraband  x30 reps   Theraband Level (Shoulder Protraction) Level 1 (Yellow)   External Rotation Strengthening;Left;Theraband  x30 reps   Theraband Level (Shoulder External Rotation) Level 1 (Yellow)   Internal Rotation Strengthening;Left;Theraband  x30 reps   Theraband Level (Shoulder Internal Rotation) Level 1 (Yellow)   Flexion Strengthening;Left;Theraband  x30 reps   Theraband Level (Shoulder Flexion) Level 1 (Yellow)   Row Strengthening;Left;Theraband  x30 reps   Shoulder Exercises: Pulleys   Flexion 3 minutes   Shoulder Exercises: ROM/Strengthening   Rhythmic Stabilization, Supine Flexion 10x 30 sec, ER/IR 10x 30sec   Other ROM/Strengthening Exercises UE ranger flex and circles x 30 reps each   Modalities   Modalities Cryotherapy   Cryotherapy    Number Minutes Cryotherapy 10 Minutes   Cryotherapy Location Shoulder   Type of Cryotherapy Other (comment)  Vasopneumatic                  PT Short Term Goals - 10/09/14 1157    PT SHORT TERM GOAL #1   Title STG's=LTG's.           PT Long Term Goals - 11/08/14 1111    PT LONG TERM GOAL #1   Title Independent with an advanced HEP.   Time 4   Period Weeks   Status On-going  Patient reports that she does "some" of the exercises   PT LONG TERM GOAL #2   Title Active left shoulder flexion to 150 degrees so the patient can easily reach overhead   Time 4   Period Weeks   Status On-going  L shoulder AROM flexion 142 deg on 11/08/2014   PT LONG TERM GOAL #3   Title Active ER to 70 degrees+ to allow for easily donning/doffing of apparel   Time 4   Period Weeks   Status Achieved  L shoulder AROM ER 73 deg as of 11/08/2014   PT LONG TERM GOAL #4   Title Increase ROM so patient is able to reach behind back to L3 with left hand   Time 4   Period Weeks   Status Achieved  Can reach into upper lumbar vertebrae   PT LONG TERM GOAL #5   Title Increase left shoulder strength to a solid 4+/5 to increase stability for performance of functional activities   Time 4   Period Weeks   Status On-going   PT LONG TERM GOAL #6   Title Perform ADL's with pain not > 2-3/10.   Time 4   Period Weeks   Status Achieved               Plan - 11/14/14 1111    Clinical Impression Statement Patient tolerated AAROM and gentle strengthening exercises well without complaint of pain throughout the treatment. All remaining goals are on-going at this time. Normal cryotherapy response noted following the removal of the vasopneumatic system to control pain and inflammation following the initattion of gentle strengthening. Denied any pain following treatment.   Pt will benefit from skilled therapeutic intervention in order to improve on the following deficits Decreased strength;Pain;Impaired UE  functional use;Decreased range of motion   Rehab Potential Excellent   PT Frequency 3x / week   PT Duration 4 weeks   PT Treatment/Interventions Electrical Stimulation;Moist Heat;Passive range of motion   PT Next Visit Plan Continue  per PT and MD POC. Continue gentle strengthening and AAROM next session. Give [place] 4 HEP next session.   PT Home Exercise Plan pendulum, PROM cane for er   Consulted and Agree with Plan of Care Patient        Problem List Patient Active Problem List   Diagnosis Date Noted  . Acute bronchitis 09/08/2014  . Mitral insufficiency 05/12/2014  . HTN (hypertension) 04/28/2014  . Lactic acidosis 09/07/2013  . Mitral valve prolapse 06/14/2013  . Chest pain, exertional 09/13/2012  . Pernicious anemia 09/07/2012  . DIVERTICULOSIS, COLON 09/06/2009  . Osteoporosis 09/06/2009  . HYPERLIPIDEMIA 09/04/2009  . PERIPHERAL NEUROPATHY 09/04/2009  . LOW BACK PAIN, CHRONIC 04/08/2007    Wynelle Fanny, PTA 11/14/2014, 11:21 AM  [facility] [address] [place], [place], [address] Phone: [phone]   Fax:  [phone]

## 2014-11-16 ENCOUNTER — Encounter: Payer: Self-pay | Admitting: Physical Therapy

## 2014-11-16 ENCOUNTER — Ambulatory Visit: Payer: Medicare Other | Admitting: Physical Therapy

## 2014-11-16 DIAGNOSIS — M25512 Pain in left shoulder: Secondary | ICD-10-CM

## 2014-11-16 DIAGNOSIS — M25612 Stiffness of left shoulder, not elsewhere classified: Secondary | ICD-10-CM | POA: Diagnosis not present

## 2014-11-16 NOTE — Therapy (Signed)
[facility] [place], [place], [address] Phone: [phone]   Fax:  [phone]  Physical Therapy Treatment  Patient Details  Name: Cassie White MRN: [id number] Date of Birth: 04/05/1941 Referring Provider:  Hendricks Limes, MD  Encounter Date: 11/16/2014      PT End of Session - 11/16/14 1033    Visit Number 12   Number of Visits 20   Date for PT Re-Evaluation 12/07/14   PT Start Time 1033   PT Stop Time 1115   PT Time Calculation (min) 42 min   Activity Tolerance Patient tolerated treatment well   Behavior During Therapy [facility] for tasks assessed/performed      Past Medical History  Diagnosis Date  . Arthritis     DJD  . Hyperlipidemia   . GERD (gastroesophageal reflux disease)   . Osteoporosis, senile   . Diverticulosis   . Scoliosis   . Peripheral neuropathy   . Hx of cardiovascular stress test     Lexiscan Myoview (2/14):  Low risk, small ant defect likely breast attenuation, no ischemia, EF 69% (no change from 2010).    . Mitral regurgitation     Echo (2/14):  EF 60-65%, Gr 1 DD, mild AI, mild bileaflet MVP, mod post directed MR, mild LAE, PASP 35.  Marland Kitchen MVP (mitral valve prolapse)   . Shoulder pain left    to have surgery 10/02/14 for rotator cuff repair    Past Surgical History  Procedure Laterality Date  . Tonsillectomy    . Vaginal hysterectomy      For Uterine Deviation   . 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 Cholecystectomy  03/2010    Dr.Gross  . Lumpectomy  06/2009    "Fatty Necrosis"  . Breast surgery  Nov 2010    Benign biopsy, right  . Cystocele repair    . Combined hysterectomy vaginal / oophorectomy / a&p repair  8756    uncertain if ovaries were removed or not    There were no vitals filed for this visit.  Visit Diagnosis:  Left shoulder pain  Shoulder stiffness, left      Subjective Assessment - 11/16/14 1045    Subjective States that her arm is doing  good. Reported sorenss that went away following UE ranger exercises but after warming up soreness was relieved.   Limitations Lifting;House hold activities   Currently in Pain? No/denies            [facility] PT Assessment - 11/16/14 0001    Assessment   Medical Diagnosis S/P Rotator Cuff Surgery.   Next MD Visit 12/07/2014                   [facility] Adult PT Treatment/Exercise - 11/16/14 0001    Shoulder Exercises: Supine   Flexion AAROM;Left  x30 reps   Other Supine Exercises AAROM Chest press x 30 reps   Shoulder Exercises: Seated   External Rotation AAROM;Left  x30 reps   Other Seated Exercises Seated ball rolls up/down, side/side, CW and CCW circles x30 reps   Shoulder Exercises: Pulleys   Flexion 3 minutes   Other Pulley Exercises UE Ranger flex/circles x30 reps each   Other Pulley Exercises Scaption pulleys x 3 min   Shoulder Exercises: Stretch   Table Stretch - Flexion Other (comment)  3 sec hold, x 20 reps   Manual Therapy   Manual Therapy Passive ROM   Passive ROM  L shoulder PROM flex/scap/ER/IR with gentle hold at end range                  PT Short Term Goals - 10/09/14 1157    PT SHORT TERM GOAL #1   Title STG's=LTG's.           PT Long Term Goals - 11/08/14 1111    PT LONG TERM GOAL #1   Title Independent with an advanced HEP.   Time 4   Period Weeks   Status On-going  Patient reports that she does "some" of the exercises   PT LONG TERM GOAL #2   Title Active left shoulder flexion to 150 degrees so the patient can easily reach overhead   Time 4   Period Weeks   Status On-going  L shoulder AROM flexion 142 deg on 11/08/2014   PT LONG TERM GOAL #3   Title Active ER to 70 degrees+ to allow for easily donning/doffing of apparel   Time 4   Period Weeks   Status Achieved  L shoulder AROM ER 73 deg as of 11/08/2014   PT LONG TERM GOAL #4   Title Increase ROM so patient is able to reach behind back to L3 with left hand   Time 4   Period  Weeks   Status Achieved  Can reach into upper lumbar vertebrae   PT LONG TERM GOAL #5   Title Increase left shoulder strength to a solid 4+/5 to increase stability for performance of functional activities   Time 4   Period Weeks   Status On-going   PT LONG TERM GOAL #6   Title Perform ADL's with pain not > 2-3/10.   Time 4   Period Weeks   Status Achieved               Plan - 11/16/14 1118    Clinical Impression Statement Patient tolerated AAROM exericses well without complaint of pain. Strengthening held off further by MPT at this time. All remaining goals on-going. Denied any pain following treatment.   Pt will benefit from skilled therapeutic intervention in order to improve on the following deficits Decreased strength;Pain;Impaired UE functional use;Decreased range of motion   Rehab Potential Excellent   PT Frequency 3x / week   PT Duration 4 weeks   PT Treatment/Interventions Electrical Stimulation;Moist Heat;Passive range of motion   PT Next Visit Plan Continue per PT and MD POC. Continue gentle AAROM. Will be 7 weeks post-op 11/20/2014.   PT Home Exercise Plan pendulum, PROM cane for er   Consulted and Agree with Plan of Care Patient        Problem List Patient Active Problem List   Diagnosis Date Noted  . Acute bronchitis 09/08/2014  . Mitral insufficiency 05/12/2014  . HTN (hypertension) 04/28/2014  . Lactic acidosis 09/07/2013  . Mitral valve prolapse 06/14/2013  . Chest pain, exertional 09/13/2012  . Pernicious anemia 09/07/2012  . DIVERTICULOSIS, COLON 09/06/2009  . Osteoporosis 09/06/2009  . HYPERLIPIDEMIA 09/04/2009  . PERIPHERAL NEUROPATHY 09/04/2009  . LOW BACK PAIN, CHRONIC 04/08/2007    Wynelle Fanny, PTA 11/16/2014, 11:20 AM  [facility] [address] [place], [place], [address] Phone: [phone]   Fax:  [phone]

## 2014-11-21 ENCOUNTER — Ambulatory Visit: Payer: Medicare Other | Admitting: Physical Therapy

## 2014-11-21 ENCOUNTER — Encounter: Payer: Self-pay | Admitting: Physical Therapy

## 2014-11-21 DIAGNOSIS — M25512 Pain in left shoulder: Secondary | ICD-10-CM | POA: Diagnosis not present

## 2014-11-21 DIAGNOSIS — M25612 Stiffness of left shoulder, not elsewhere classified: Secondary | ICD-10-CM

## 2014-11-21 NOTE — Therapy (Signed)
[facility] [place], [place], [address] Phone: [phone]   Fax:  [phone]  Physical Therapy Treatment  Patient Details  Name: Cassie White MRN: [id number] Date of Birth: May 31, 1941 Referring Provider:  Hendricks Limes, MD  Encounter Date: 11/21/2014      PT End of Session - 11/21/14 1039    Visit Number 13   Number of Visits 20   Date for PT Re-Evaluation 12/07/14   PT Start Time 0630   PT Stop Time 1117   PT Time Calculation (min) 42 min   Activity Tolerance Patient tolerated treatment well   Behavior During Therapy [facility] for tasks assessed/performed      Past Medical History  Diagnosis Date  . Arthritis     DJD  . Hyperlipidemia   . GERD (gastroesophageal reflux disease)   . Osteoporosis, senile   . Diverticulosis   . Scoliosis   . Peripheral neuropathy   . Hx of cardiovascular stress test     Lexiscan Myoview (2/14):  Low risk, small ant defect likely breast attenuation, no ischemia, EF 69% (no change from 2010).    . Mitral regurgitation     Echo (2/14):  EF 60-65%, Gr 1 DD, mild AI, mild bileaflet MVP, mod post directed MR, mild LAE, PASP 35.  Marland Kitchen MVP (mitral valve prolapse)   . Shoulder pain left    to have surgery 10/02/14 for rotator cuff repair    Past Surgical History  Procedure Laterality Date  . Tonsillectomy    . Vaginal hysterectomy      For Uterine Deviation   . 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 Cholecystectomy  03/2010    Dr.Gross  . Lumpectomy  06/2009    "Fatty Necrosis"  . Breast surgery  Nov 2010    Benign biopsy, right  . Cystocele repair    . Combined hysterectomy vaginal / oophorectomy / a&p repair  1601    uncertain if ovaries were removed or not    There were no vitals filed for this visit.  Visit Diagnosis:  Left shoulder pain  Shoulder stiffness, left      Subjective Assessment - 11/21/14 1038    Subjective States that her arm has the  usual soreness and doing good.   Limitations Lifting;House hold activities   Currently in Pain? No/denies  Just soreness            [facility] PT Assessment - 11/21/14 0001    Assessment   Medical Diagnosis S/P Rotator Cuff Surgery.   Next MD Visit 12/07/2014                     [facility] Adult PT Treatment/Exercise - 11/21/14 0001    Shoulder Exercises: Supine   Flexion AAROM;Left  x30 reps   Other Supine Exercises AAROM Chest press x 30 reps   Shoulder Exercises: Seated   External Rotation AAROM;Left  x30 reps   Other Seated Exercises Seated ball rolls up/down, side/side, CW and CCW circles x30 reps   Shoulder Exercises: Standing   Other Standing Exercises Wall wash x10 laps   Shoulder Exercises: Pulleys   Flexion Other (comment)  4 minutes   Other Pulley Exercises UE Ranger flex/circles x30 reps each   Other Pulley Exercises Scaption pulleys x 4 min   Shoulder Exercises: ROM/Strengthening   Rhythmic Stabilization, Supine Flexion 10x 30 sec, ER/IR 10x 30sec   Shoulder Exercises: Stretch  Table Stretch - Flexion Other (comment)  3 sec hold x20 reps   Manual Therapy   Manual Therapy Passive ROM   Passive ROM L shoulder PROM flex/scap/ER/IR with gentle hold at end range                  PT Short Term Goals - 10/09/14 1157    PT SHORT TERM GOAL #1   Title STG's=LTG's.           PT Long Term Goals - 11/08/14 1111    PT LONG TERM GOAL #1   Title Independent with an advanced HEP.   Time 4   Period Weeks   Status On-going  Patient reports that she does "some" of the exercises   PT LONG TERM GOAL #2   Title Active left shoulder flexion to 150 degrees so the patient can easily reach overhead   Time 4   Period Weeks   Status On-going  L shoulder AROM flexion 142 deg on 11/08/2014   PT LONG TERM GOAL #3   Title Active ER to 70 degrees+ to allow for easily donning/doffing of apparel   Time 4   Period Weeks   Status Achieved  L shoulder AROM ER 73  deg as of 11/08/2014   PT LONG TERM GOAL #4   Title Increase ROM so patient is able to reach behind back to L3 with left hand   Time 4   Period Weeks   Status Achieved  Can reach into upper lumbar vertebrae   PT LONG TERM GOAL #5   Title Increase left shoulder strength to a solid 4+/5 to increase stability for performance of functional activities   Time 4   Period Weeks   Status On-going   PT LONG TERM GOAL #6   Title Perform ADL's with pain not > 2-3/10.   Time 4   Period Weeks   Status Achieved               Plan - 11/21/14 1119    Clinical Impression Statement Patient tolerated treatment well today without complaint of pain. Tolerated stability exercises well without complaint of pain. All remaining goals on-going. Denied pain and soreness following treatment.   Pt will benefit from skilled therapeutic intervention in order to improve on the following deficits Decreased strength;Pain;Impaired UE functional use;Decreased range of motion   Rehab Potential Excellent   PT Frequency 3x / week   PT Duration 4 weeks   PT Treatment/Interventions Electrical Stimulation;Moist Heat;Passive range of motion   PT Next Visit Plan Continue per PT and MD POC. Continue gentle AAROM. Will be 7 weeks post-op 11/20/2014. Reassess ROM next session.   PT Home Exercise Plan pendulum, PROM cane for er   Consulted and Agree with Plan of Care Patient        Problem List Patient Active Problem List   Diagnosis Date Noted  . Acute bronchitis 09/08/2014  . Mitral insufficiency 05/12/2014  . HTN (hypertension) 04/28/2014  . Lactic acidosis 09/07/2013  . Mitral valve prolapse 06/14/2013  . Chest pain, exertional 09/13/2012  . Pernicious anemia 09/07/2012  . DIVERTICULOSIS, COLON 09/06/2009  . Osteoporosis 09/06/2009  . HYPERLIPIDEMIA 09/04/2009  . PERIPHERAL NEUROPATHY 09/04/2009  . LOW BACK PAIN, CHRONIC 04/08/2007    Wynelle Fanny, PTA 11/21/2014, 11:22 AM  [facility] [address] [place], [place], [address] Phone: [phone]   Fax:  [phone]

## 2014-11-23 ENCOUNTER — Ambulatory Visit: Payer: Medicare Other | Admitting: *Deleted

## 2014-11-23 ENCOUNTER — Encounter: Payer: Self-pay | Admitting: *Deleted

## 2014-11-23 DIAGNOSIS — M25512 Pain in left shoulder: Secondary | ICD-10-CM

## 2014-11-23 DIAGNOSIS — M25612 Stiffness of left shoulder, not elsewhere classified: Secondary | ICD-10-CM

## 2014-11-23 NOTE — Therapy (Signed)
[facility] [place], [place], [address] Phone: [phone]   Fax:  [phone]  Physical Therapy Treatment  Patient Details  Name: Cassie White MRN: [id number] Date of Birth: 09-29-1940 Referring Provider:  Hendricks Limes, MD  Encounter Date: 11/23/2014      PT End of Session - 11/23/14 1341    Visit Number 14   Number of Visits 20   Date for PT Re-Evaluation 12/07/14   PT Start Time 1300   PT Stop Time 1761   PT Time Calculation (min) 47 min      Past Medical History  Diagnosis Date  . Arthritis     DJD  . Hyperlipidemia   . GERD (gastroesophageal reflux disease)   . Osteoporosis, senile   . Diverticulosis   . Scoliosis   . Peripheral neuropathy   . Hx of cardiovascular stress test     Lexiscan Myoview (2/14):  Low risk, small ant defect likely breast attenuation, no ischemia, EF 69% (no change from 2010).    . Mitral regurgitation     Echo (2/14):  EF 60-65%, Gr 1 DD, mild AI, mild bileaflet MVP, mod post directed MR, mild LAE, PASP 35.  Marland Kitchen MVP (mitral valve prolapse)   . Shoulder pain left    to have surgery 10/02/14 for rotator cuff repair    Past Surgical History  Procedure Laterality Date  . Tonsillectomy    . Vaginal hysterectomy      For Uterine Deviation   . 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 Cholecystectomy  03/2010    Dr.Gross  . Lumpectomy  06/2009    "Fatty Necrosis"  . Breast surgery  Nov 2010    Benign biopsy, right  . Cystocele repair    . Combined hysterectomy vaginal / oophorectomy / a&p repair  6073    uncertain if ovaries were removed or not    There were no vitals filed for this visit.  Visit Diagnosis:  Left shoulder pain  Shoulder stiffness, left      Subjective Assessment - 11/23/14 1312    Subjective States that her arm has the usual soreness and doing good.   Limitations Lifting;House hold activities   Currently in Pain? No/denies   Pain  Score 1    Pain Location Shoulder   Pain Orientation Left   Pain Descriptors / Indicators Sore   Pain Onset More than a month ago   Pain Frequency Intermittent            [facility] PT Assessment - 11/23/14 0001    AROM   Overall AROM  Deficits   Overall AROM Comments --   AROM Assessment Site --   Right/Left Shoulder Left   Left Shoulder Flexion 160 Degrees  supine   Left Shoulder Internal Rotation 72 Degrees   Left Shoulder External Rotation 74 Degrees                     [facility] Adult PT Treatment/Exercise - 11/23/14 0001    Shoulder Exercises: Supine   Flexion AAROM;Left   Shoulder Exercises: Pulleys   Flexion 3 minutes   Other Pulley Exercises UE Ranger flex/circles x30 reps each   Shoulder Exercises: ROM/Strengthening   Rhythmic Stabilization, Supine Flexion 10x 30 sec, ER/IR 10x 30sec   Manual Therapy   Manual Therapy Passive ROM   Passive ROM L shoulder PROM flex/scap/ER/IR with gentle hold at end range, Rhytmic stab  for elev. and ER/IR at dif. angles     Supine cane press and overhead 3x10             PT Short Term Goals - 10/09/14 1157    PT SHORT TERM GOAL #1   Title STG's=LTG's.           PT Long Term Goals - 11/08/14 1111    PT LONG TERM GOAL #1   Title Independent with an advanced HEP.   Time 4   Period Weeks   Status On-going  Patient reports that she does "some" of the exercises   PT LONG TERM GOAL #2   Title Active left shoulder flexion to 150 degrees so the patient can easily reach overhead   Time 4   Period Weeks   Status On-going  L shoulder AROM flexion 142 deg on 11/08/2014   PT LONG TERM GOAL #3   Title Active ER to 70 degrees+ to allow for easily donning/doffing of apparel   Time 4   Period Weeks   Status Achieved  L shoulder AROM ER 73 deg as of 11/08/2014   PT LONG TERM GOAL #4   Title Increase ROM so patient is able to reach behind back to L3 with left hand   Time 4   Period Weeks   Status Achieved  Can  reach into upper lumbar vertebrae   PT LONG TERM GOAL #5   Title Increase left shoulder strength to a solid 4+/5 to increase stability for performance of functional activities   Time 4   Period Weeks   Status On-going   PT LONG TERM GOAL #6   Title Perform ADL's with pain not > 2-3/10.   Time 4   Period Weeks   Status Achieved               Plan - 11/23/14 1342    Clinical Impression Statement Pt did great today and conts to progress towards goals with minimal pain   Pt will benefit from skilled therapeutic intervention in order to improve on the following deficits Decreased strength;Pain;Impaired UE functional use;Decreased range of motion   Rehab Potential Excellent   PT Frequency 3x / week   PT Duration 4 weeks   PT Treatment/Interventions Electrical Stimulation;Moist Heat;Passive range of motion   PT Next Visit Plan Continue per PT and MD POC. Continue gentle AAROM. Will be 7 weeks post-op 11/20/2014. Reassess ROM next session.   PT Home Exercise Plan pendulum, PROM cane for er        Problem List Patient Active Problem List   Diagnosis Date Noted  . Acute bronchitis 09/08/2014  . Mitral insufficiency 05/12/2014  . HTN (hypertension) 04/28/2014  . Lactic acidosis 09/07/2013  . Mitral valve prolapse 06/14/2013  . Chest pain, exertional 09/13/2012  . Pernicious anemia 09/07/2012  . DIVERTICULOSIS, COLON 09/06/2009  . Osteoporosis 09/06/2009  . HYPERLIPIDEMIA 09/04/2009  . PERIPHERAL NEUROPATHY 09/04/2009  . LOW BACK PAIN, CHRONIC 04/08/2007    RAMSEUR,CHRIS, PTA 11/23/2014, 2:30 PM  [facility] [address] [place], [place], [address] Phone: [phone]   Fax:  [phone]

## 2014-11-28 ENCOUNTER — Ambulatory Visit: Payer: Medicare Other | Admitting: Physical Therapy

## 2014-11-28 ENCOUNTER — Encounter: Payer: Self-pay | Admitting: Physical Therapy

## 2014-11-28 DIAGNOSIS — M25612 Stiffness of left shoulder, not elsewhere classified: Secondary | ICD-10-CM | POA: Diagnosis not present

## 2014-11-28 DIAGNOSIS — M25512 Pain in left shoulder: Secondary | ICD-10-CM | POA: Diagnosis not present

## 2014-11-28 NOTE — Therapy (Signed)
[facility] [place], [place], [address] Phone: [phone]   Fax:  [phone]  Physical Therapy Treatment  Patient Details  Name: Cassie White MRN: [id number] Date of Birth: [birth year] Referring Provider:  Hendricks Limes, MD  Encounter Date: 11/28/2014      PT End of Session - 11/28/14 1037    Visit Number 15   Number of Visits 20   Date for PT Re-Evaluation 12/07/14   PT Start Time 1032   PT Stop Time 1116   PT Time Calculation (min) 44 min   Activity Tolerance Patient tolerated treatment well   Behavior During Therapy [facility] for tasks assessed/performed      Past Medical History  Diagnosis Date  . Arthritis     DJD  . Hyperlipidemia   . GERD (gastroesophageal reflux disease)   . Osteoporosis, senile   . Diverticulosis   . Scoliosis   . Peripheral neuropathy   . Hx of cardiovascular stress test     Lexiscan Myoview (2/14):  Low risk, small ant defect likely breast attenuation, no ischemia, EF 69% (no change from 2010).    . Mitral regurgitation     Echo (2/14):  EF 60-65%, Gr 1 DD, mild AI, mild bileaflet MVP, mod post directed MR, mild LAE, PASP 35.  Marland Kitchen MVP (mitral valve prolapse)   . Shoulder pain left    to have surgery 10/02/14 for rotator cuff repair    Past Surgical History  Procedure Laterality Date  . Tonsillectomy    . Vaginal hysterectomy      For Uterine Deviation   . 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 Cholecystectomy  03/2010    Dr.Gross  . Lumpectomy  06/2009    "Fatty Necrosis"  . Breast surgery  Nov 2010    Benign biopsy, right  . Cystocele repair    . Combined hysterectomy vaginal / oophorectomy / a&p repair  7253    uncertain if ovaries were removed or not    There were no vitals filed for this visit.  Visit Diagnosis:  Left shoulder pain  Shoulder stiffness, left      Subjective Assessment - 11/28/14 1046    Subjective States that her arm has the  usual soreness and doing good.   Limitations Lifting;House hold activities   Currently in Pain? No/denies            [facility] PT Assessment - 11/28/14 0001    Assessment   Medical Diagnosis S/P Rotator Cuff Surgery.   Onset Date 10/02/14   Next MD Visit 12/07/2014                     [facility] Adult PT Treatment/Exercise - 11/28/14 0001    Shoulder Exercises: Supine   Flexion AAROM;Left;Other (comment)  x30 reps   Other Supine Exercises AAROM Chest press x 30 reps   Shoulder Exercises: Seated   External Rotation AROM;Left;Other (comment)  x30 reps   Shoulder Exercises: Pulleys   Flexion 3 minutes   Other Pulley Exercises UE Ranger flex/circles x30 reps each   Shoulder Exercises: ROM/Strengthening   Rhythmic Stabilization, Supine Flexion 10x 30 sec, ER/IR 10x 30sec   Other ROM/Strengthening Exercises --   Manual Therapy   Manual Therapy Passive ROM   Passive ROM L shoulder PROM flex/scap/ER/IR with gentle hold at end range  PT Short Term Goals - 11/28/14 1036    PT SHORT TERM GOAL #1   Title STG's=LTG's.   Status On-going           PT Long Term Goals - 11/28/14 1036    PT LONG TERM GOAL #1   Title Independent with an advanced HEP.   Time 4   Period Weeks   Status On-going  Patient reports that she does "some" of the exercises   PT LONG TERM GOAL #2   Title Active left shoulder flexion to 150 degrees so the patient can easily reach overhead   Time 4   Period Weeks   Status On-going  L shoulder AROM flexion 142 deg on 11/08/2014   PT LONG TERM GOAL #3   Title Active ER to 70 degrees+ to allow for easily donning/doffing of apparel   Time 4   Period Weeks   Status Achieved  L shoulder AROM ER 73 deg as of 11/08/2014   PT LONG TERM GOAL #4   Title Increase ROM so patient is able to reach behind back to L3 with left hand   Time 4   Period Weeks   Status Achieved  Can reach into upper lumbar vertebrae   PT LONG TERM GOAL #5    Title Increase left shoulder strength to a solid 4+/5 to increase stability for performance of functional activities   Time 4   Period Weeks   Status On-going   PT LONG TERM GOAL #6   Title Perform ADL's with pain not > 2-3/10.   Time 4   Period Weeks   Status Achieved               Plan - 11/28/14 1043    Clinical Impression Statement Patient continues to tolerate treatment well with minimal to no pain in L shoulder. Firm end feels noted in every direction during PROM. Demonstrates good stability during stability exercises. Denied pain following  treatment only soreness.   Pt will benefit from skilled therapeutic intervention in order to improve on the following deficits Decreased strength;Pain;Impaired UE functional use;Decreased range of motion   Clinical Impairments Affecting Rehab Potential L shoulder surgery date 10/02/2014 (will be 9 weeks post-op 12/04/2014)   PT Frequency 3x / week   PT Duration 4 weeks   PT Treatment/Interventions Electrical Stimulation;Moist Heat;Passive range of motion   PT Next Visit Plan Continue per PT and MD POC. Continue gentle AAROM. Reassess ROM next treatment for weekly goal assessment.   PT Home Exercise Plan pendulum, PROM cane for er   Consulted and Agree with Plan of Care Patient        Problem List Patient Active Problem List   Diagnosis Date Noted  . Acute bronchitis 09/08/2014  . Mitral insufficiency 05/12/2014  . HTN (hypertension) 04/28/2014  . Lactic acidosis 09/07/2013  . Mitral valve prolapse 06/14/2013  . Chest pain, exertional 09/13/2012  . Pernicious anemia 09/07/2012  . DIVERTICULOSIS, COLON 09/06/2009  . Osteoporosis 09/06/2009  . HYPERLIPIDEMIA 09/04/2009  . PERIPHERAL NEUROPATHY 09/04/2009  . LOW BACK PAIN, CHRONIC 04/08/2007    Wynelle Fanny, PTA 11/28/2014, 11:26 AM  [facility] [address] [place], [place], [address] Phone: [phone]   Fax:   [phone]

## 2014-11-30 ENCOUNTER — Ambulatory Visit: Payer: Medicare Other | Admitting: Physical Therapy

## 2014-11-30 ENCOUNTER — Encounter: Payer: Self-pay | Admitting: Physical Therapy

## 2014-11-30 DIAGNOSIS — M25512 Pain in left shoulder: Secondary | ICD-10-CM

## 2014-11-30 DIAGNOSIS — M25612 Stiffness of left shoulder, not elsewhere classified: Secondary | ICD-10-CM

## 2014-11-30 NOTE — Therapy (Signed)
[facility] [place], [place], [address] Phone: [phone]   Fax:  [phone]  Physical Therapy Treatment  Patient Details  Name: Cassie White MRN: [id number] Date of Birth: 1941/05/01 Referring Provider:  Hendricks Limes, MD  Encounter Date: 11/30/2014      PT End of Session - 11/30/14 1034    Visit Number 16   Number of Visits 20   Date for PT Re-Evaluation 12/07/14   PT Start Time 1031   PT Stop Time 1116   PT Time Calculation (min) 45 min   Activity Tolerance Patient tolerated treatment well   Behavior During Therapy [facility] for tasks assessed/performed      Past Medical History  Diagnosis Date  . Arthritis     DJD  . Hyperlipidemia   . GERD (gastroesophageal reflux disease)   . Osteoporosis, senile   . Diverticulosis   . Scoliosis   . Peripheral neuropathy   . Hx of cardiovascular stress test     Lexiscan Myoview (2/14):  Low risk, small ant defect likely breast attenuation, no ischemia, EF 69% (no change from 2010).    . Mitral regurgitation     Echo (2/14):  EF 60-65%, Gr 1 DD, mild AI, mild bileaflet MVP, mod post directed MR, mild LAE, PASP 35.  Marland Kitchen MVP (mitral valve prolapse)   . Shoulder pain left    to have surgery 10/02/14 for rotator cuff repair    Past Surgical History  Procedure Laterality Date  . Tonsillectomy    . Vaginal hysterectomy      For Uterine Deviation   . 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 Cholecystectomy  03/2010    Dr.Gross  . Lumpectomy  06/2009    "Fatty Necrosis"  . Breast surgery  Nov 2010    Benign biopsy, right  . Cystocele repair    . Combined hysterectomy vaginal / oophorectomy / a&p repair  2025    uncertain if ovaries were removed or not    There were no vitals filed for this visit.  Visit Diagnosis:  Left shoulder pain  Shoulder stiffness, left      Subjective Assessment - 11/30/14 1033    Subjective States that her shoulder feels  "pretty good" with just a bit of soreness.   Limitations Lifting;House hold activities   Currently in Pain? No/denies            [facility] PT Assessment - 11/30/14 0001    Assessment   Medical Diagnosis S/P Rotator Cuff Surgery.   Onset Date 10/02/14   Next MD Visit 12/07/2014   AROM   Overall AROM  Deficits   Overall AROM Comments scaption 180   AROM Assessment Site Shoulder   Right/Left Shoulder Left   Left Shoulder Flexion 172 Degrees   Left Shoulder Internal Rotation 73 Degrees   Left Shoulder External Rotation 75 Degrees                     [facility] Adult PT Treatment/Exercise - 11/30/14 0001    Shoulder Exercises: Supine   Flexion AAROM;Left;Other (comment)  x30 reps   Other Supine Exercises AAROM Chest press x 30 reps   Shoulder Exercises: Seated   External Rotation AROM;Left  x30 reps   Shoulder Exercises: Pulleys   Flexion 3 minutes   Other Pulley Exercises UE Ranger flex/circles x30 reps each   Shoulder Exercises: ROM/Strengthening   Rhythmic Stabilization, Supine Flex/ext, ER/IR  x8 min   Other ROM/Strengthening Exercises Supine ABCs x1 rep   Manual Therapy   Manual Therapy Passive ROM   Passive ROM L shoulder PROM flex/scap/ER/IR with gentle hold at end range                  PT Short Term Goals - 11/30/14 1118    PT SHORT TERM GOAL #1   Title STG's=LTG's.   Status On-going           PT Long Term Goals - 11/30/14 1116    PT LONG TERM GOAL #1   Title Independent with an advanced HEP.   Time 4   Period Weeks   Status On-going  Patient reports that she does "some" of the exercises   PT LONG TERM GOAL #2   Title Active left shoulder flexion to 150 degrees so the patient can easily reach overhead   Time 4   Period Weeks   Status Achieved   PT LONG TERM GOAL #3   Title Active ER to 70 degrees+ to allow for easily donning/doffing of apparel   Time 4   Period Weeks   Status Achieved   PT LONG TERM GOAL #4   Title Increase ROM so  patient is able to reach behind back to L3 with left hand   Time 4   Period Weeks   Status Achieved   PT LONG TERM GOAL #5   Title Increase left shoulder strength to a solid 4+/5 to increase stability for performance of functional activities   Time 4   Period Weeks   Status On-going   PT LONG TERM GOAL #6   Title Perform ADL's with pain not > 2-3/10.   Time 4   Period Weeks   Status Achieved               Plan - 11/30/14 1119    Clinical Impression Statement Patient continues to excel in therapy with minimal to no pain reported at each appointment. Firm end feels noted in each direction during PROM. Each AROM measurement continues to improve. Achieved LT goal #2 of AROM L shoulder flexion of 150 deg at today's appointment. Denied pain following treatment.   Pt will benefit from skilled therapeutic intervention in order to improve on the following deficits Decreased strength;Pain;Impaired UE functional use;Decreased range of motion   Rehab Potential Excellent   Clinical Impairments Affecting Rehab Potential L shoulder surgery date 10/02/2014 (will be 9 weeks post-op 12/04/2014)   PT Frequency 3x / week   PT Duration 4 weeks   PT Treatment/Interventions Electrical Stimulation;Moist Heat;Passive range of motion   PT Next Visit Plan Continue per PT and MD POC.    PT Home Exercise Plan pendulum, PROM cane for er   Consulted and Agree with Plan of Care Patient        Problem List Patient Active Problem List   Diagnosis Date Noted  . Acute bronchitis 09/08/2014  . Mitral insufficiency 05/12/2014  . HTN (hypertension) 04/28/2014  . Lactic acidosis 09/07/2013  . Mitral valve prolapse 06/14/2013  . Chest pain, exertional 09/13/2012  . Pernicious anemia 09/07/2012  . DIVERTICULOSIS, COLON 09/06/2009  . Osteoporosis 09/06/2009  . HYPERLIPIDEMIA 09/04/2009  . PERIPHERAL NEUROPATHY 09/04/2009  . LOW BACK PAIN, CHRONIC 04/08/2007    Wynelle Fanny, PTA 11/30/2014, 11:23  AM  [facility] [address] [place], [place], [address] Phone: [phone]   Fax:  [phone]

## 2014-12-05 ENCOUNTER — Encounter: Payer: Self-pay | Admitting: Physical Therapy

## 2014-12-05 ENCOUNTER — Ambulatory Visit: Payer: Medicare Other | Attending: Orthopedic Surgery | Admitting: Physical Therapy

## 2014-12-05 DIAGNOSIS — M25612 Stiffness of left shoulder, not elsewhere classified: Secondary | ICD-10-CM

## 2014-12-05 DIAGNOSIS — M25512 Pain in left shoulder: Secondary | ICD-10-CM | POA: Diagnosis not present

## 2014-12-05 NOTE — Therapy (Signed)
[facility] [place], [place], [address] Phone: [phone]   Fax:  [phone]  Physical Therapy Treatment  Patient Details  Name: Cassie White MRN: [id number] Date of Birth: Feb 28, 1941 Referring Provider:  Hendricks Limes, MD  Encounter Date: 12/05/2014      PT End of Session - 12/05/14 1103    Visit Number 17   Number of Visits 20   Date for PT Re-Evaluation 12/07/14   PT Start Time 1029   PT Stop Time 1109   PT Time Calculation (min) 40 min   Activity Tolerance Patient tolerated treatment well   Behavior During Therapy [facility] for tasks assessed/performed      Past Medical History  Diagnosis Date  . Arthritis     DJD  . Hyperlipidemia   . GERD (gastroesophageal reflux disease)   . Osteoporosis, senile   . Diverticulosis   . Scoliosis   . Peripheral neuropathy   . Hx of cardiovascular stress test     Lexiscan Myoview (2/14):  Low risk, small ant defect likely breast attenuation, no ischemia, EF 69% (no change from 2010).    . Mitral regurgitation     Echo (2/14):  EF 60-65%, Gr 1 DD, mild AI, mild bileaflet MVP, mod post directed MR, mild LAE, PASP 35.  Marland Kitchen MVP (mitral valve prolapse)   . Shoulder pain left    to have surgery 10/02/14 for rotator cuff repair    Past Surgical History  Procedure Laterality Date  . Tonsillectomy    . Vaginal hysterectomy      For Uterine Deviation   . 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 Cholecystectomy  03/2010    Dr.Gross  . Lumpectomy  06/2009    "Fatty Necrosis"  . Breast surgery  Nov 2010    Benign biopsy, right  . Cystocele repair    . Combined hysterectomy vaginal / oophorectomy / a&p repair  2542    uncertain if ovaries were removed or not    There were no vitals filed for this visit.  Visit Diagnosis:  Left shoulder pain  Shoulder stiffness, left      Subjective Assessment - 12/05/14 1034    Subjective sore after planting flowers  using both UE   Limitations Lifting;House hold activities   Currently in Pain? Yes   Pain Score 1    Pain Location Shoulder   Pain Orientation Left   Pain Descriptors / Indicators Sore   Pain Type Surgical pain   Pain Onset More than a month ago   Pain Frequency Intermittent   Aggravating Factors  movement   Pain Relieving Factors rest            [facility] PT Assessment - 12/05/14 0001    ROM / Strength   AROM / PROM / Strength AROM;PROM   PROM   Overall PROM  Deficits   Overall PROM Comments supine AAROM   PROM Assessment Site Shoulder   Left Shoulder Flexion 150 Degrees   Left Shoulder External Rotation 68 Degrees                     [facility] Adult PT Treatment/Exercise - 12/05/14 0001    Shoulder Exercises: Supine   Other Supine Exercises AAROM chest press and flexion with cane 2x10 each   Shoulder Exercises: Standing   Other Standing Exercises wall circles 2x10 each way   Shoulder Exercises: Pulleys   Flexion --  57mn   Other Pulley Exercises UE ranger for elevation/circles 2x10 each   Manual Therapy   Manual Therapy Passive ROM   Passive ROM passive assistive ROM for flexion and ER wirh gentle range then rhythmic stabilization for IR/ER in scaption and flex/ext '@90'                   PT Short Term Goals - 11/30/14 1118    PT SHORT TERM GOAL #1   Title STG's=LTG's.   Status On-going           PT Long Term Goals - 11/30/14 1116    PT LONG TERM GOAL #1   Title Independent with an advanced HEP.   Time 4   Period Weeks   Status On-going  Patient reports that she does "some" of the exercises   PT LONG TERM GOAL #2   Title Active left shoulder flexion to 150 degrees so the patient can easily reach overhead   Time 4   Period Weeks   Status Achieved   PT LONG TERM GOAL #3   Title Active ER to 70 degrees+ to allow for easily donning/doffing of apparel   Time 4   Period Weeks   Status Achieved   PT LONG TERM GOAL #4   Title Increase ROM  so patient is able to reach behind back to L3 with left hand   Time 4   Period Weeks   Status Achieved   PT LONG TERM GOAL #5   Title Increase left shoulder strength to a solid 4+/5 to increase stability for performance of functional activities   Time 4   Period Weeks   Status On-going   PT LONG TERM GOAL #6   Title Perform ADL's with pain not > 2-3/10.   Time 4   Period Weeks   Status Achieved               Plan - 12/05/14 1104    Clinical Impression Statement patient is progressing with activities. Not a lot of pain per patient. has good P/AAROM in left shoulder today. Educated patient on non use of arm for ADL's until MD approval to avoid re-injury. no further goals met today.   Pt will benefit from skilled therapeutic intervention in order to improve on the following deficits Decreased strength;Pain;Impaired UE functional use;Decreased range of motion   Rehab Potential Excellent   Clinical Impairments Affecting Rehab Potential L shoulder surgery date 10/02/2014 (will be 9 weeks post-op 12/04/2014)   PT Frequency 3x / week   PT Duration 4 weeks   PT Treatment/Interventions Electrical Stimulation;Moist Heat;Passive range of motion   PT Next Visit Plan Continue with POC/ (will need MD note/renewal next visit from MD. NVeverly Fells   Consulted and Agree with Plan of Care Patient        Problem List Patient Active Problem List   Diagnosis Date Noted  . Acute bronchitis 09/08/2014  . Mitral insufficiency 05/12/2014  . HTN (hypertension) 04/28/2014  . Lactic acidosis 09/07/2013  . Mitral valve prolapse 06/14/2013  . Chest pain, exertional 09/13/2012  . Pernicious anemia 09/07/2012  . DIVERTICULOSIS, COLON 09/06/2009  . Osteoporosis 09/06/2009  . HYPERLIPIDEMIA 09/04/2009  . PERIPHERAL NEUROPATHY 09/04/2009  . LOW BACK PAIN, CHRONIC 04/08/2007    Aleyssa Pike P, PTA 12/05/2014, 11:10 AM  [facility]MP[place] [address]Phone: [phone]  Fax:  [phone]

## 2014-12-06 ENCOUNTER — Ambulatory Visit: Payer: Medicare Other | Admitting: Physical Therapy

## 2014-12-06 ENCOUNTER — Encounter: Payer: Self-pay | Admitting: Physical Therapy

## 2014-12-06 DIAGNOSIS — M25612 Stiffness of left shoulder, not elsewhere classified: Secondary | ICD-10-CM

## 2014-12-06 DIAGNOSIS — M25512 Pain in left shoulder: Secondary | ICD-10-CM

## 2014-12-06 NOTE — Therapy (Signed)
[facility] [place], [place], [address] Phone: [phone]   Fax:  [phone]  Physical Therapy Treatment  Patient Details  Name: Cassie White MRN: [id number] Date of Birth: [birth year] Referring Provider:  Hendricks Limes, MD  Encounter Date: 12/06/2014      PT End of Session - 12/06/14 1058    Visit Number 18   Number of Visits 20   Date for PT Re-Evaluation 12/07/14   PT Start Time 1030   PT Stop Time 1110   PT Time Calculation (min) 40 min   Activity Tolerance Patient tolerated treatment well   Behavior During Therapy [facility] for tasks assessed/performed      Past Medical History  Diagnosis Date  . Arthritis     DJD  . Hyperlipidemia   . GERD (gastroesophageal reflux disease)   . Osteoporosis, senile   . Diverticulosis   . Scoliosis   . Peripheral neuropathy   . Hx of cardiovascular stress test     Lexiscan Myoview (2/14):  Low risk, small ant defect likely breast attenuation, no ischemia, EF 69% (no change from 2010).    . Mitral regurgitation     Echo (2/14):  EF 60-65%, Gr 1 DD, mild AI, mild bileaflet MVP, mod post directed MR, mild LAE, PASP 35.  Marland Kitchen MVP (mitral valve prolapse)   . Shoulder pain left    to have surgery 10/02/14 for rotator cuff repair    Past Surgical History  Procedure Laterality Date  . Tonsillectomy    . Vaginal hysterectomy      For Uterine Deviation   . 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 Cholecystectomy  03/2010    Dr.Gross  . Lumpectomy  06/2009    "Fatty Necrosis"  . Breast surgery  Nov 2010    Benign biopsy, right  . Cystocele repair    . Combined hysterectomy vaginal / oophorectomy / a&p repair  7322    uncertain if ovaries were removed or not    There were no vitals filed for this visit.  Visit Diagnosis:  Left shoulder pain  Shoulder stiffness, left      Subjective Assessment - 12/06/14 1035    Subjective no complaints today   Limitations Lifting;House hold activities   Currently in Pain? No/denies            [facility] PT Assessment - 12/06/14 0001    ROM / Strength   AROM / PROM / Strength AROM;PROM   PROM   Overall PROM  Deficits   Overall PROM Comments supine AAROM   PROM Assessment Site Shoulder   Left Shoulder Flexion 155 Degrees   Left Shoulder External Rotation 68 Degrees                     [facility] Adult PT Treatment/Exercise - 12/06/14 0001    Shoulder Exercises: Supine   Other Supine Exercises AAROM chest press and flexion with cane 2x10 each   Shoulder Exercises: Standing   Other Standing Exercises wall circles 2x10 each way   Shoulder Exercises: Pulleys   Flexion --  67min   Other Pulley Exercises UE ranger for elevation/circles 2x10 each   Manual Therapy   Manual Therapy Passive ROM   Passive ROM passive assistive ROM for flexion and ER wirh gentle range then rhythmic stabilization for IR/ER in scaption and flex/ext @90   PT Short Term Goals - 11/30/14 1118    PT SHORT TERM GOAL #1   Title STG's=LTG's.   Status On-going           PT Long Term Goals - 12/06/14 1110    PT LONG TERM GOAL #1   Title Independent with an advanced HEP.   Time 4   Period Weeks   Status On-going   PT LONG TERM GOAL #2   Title Active left shoulder flexion to 150 degrees so the patient can easily reach overhead   Time 4   Period Weeks   Status On-going  155 degrees in supine   PT LONG TERM GOAL #3   Title Active ER to 70 degrees+ to allow for easily donning/doffing of apparel   Time 4   Period Weeks   Status On-going  68 degrees in supine   PT LONG TERM GOAL #4   Title Increase ROM so patient is able to reach behind back to L3 with left hand   Time 4   Period Weeks   Status On-going   PT LONG TERM GOAL #5   Title Increase left shoulder strength to a solid 4+/5 to increase stability for performance of functional activities   Time 4   Period Weeks    Status On-going   PT LONG TERM GOAL #6   Title Perform ADL's with pain not > 2-3/10.   Time 4   Period Weeks   Status Achieved               Plan - 12/06/14 1102    Clinical Impression Statement patient continues to progress with all activities. no pain compaints today, only soreness from using arm at home. continue to educate patient on non use of arm to avoid injury. improved P/AAROM. goals ongoing.   Pt will benefit from skilled therapeutic intervention in order to improve on the following deficits Decreased strength;Pain;Impaired UE functional use;Decreased range of motion   Clinical Impairments Affecting Rehab Potential L shoulder surgery date 10/02/2014 (will be 9 weeks post-op 12/04/2014)   PT Frequency 3x / week   PT Duration 4 weeks   PT Treatment/Interventions Electrical Stimulation;Moist Heat;Passive range of motion   PT Next Visit Plan will continue with POC per MD   Consulted and Agree with Plan of Care Patient        Problem List Patient Active Problem List   Diagnosis Date Noted  . Acute bronchitis 09/08/2014  . Mitral insufficiency 05/12/2014  . HTN (hypertension) 04/28/2014  . Lactic acidosis 09/07/2013  . Mitral valve prolapse 06/14/2013  . Chest pain, exertional 09/13/2012  . Pernicious anemia 09/07/2012  . DIVERTICULOSIS, COLON 09/06/2009  . Osteoporosis 09/06/2009  . HYPERLIPIDEMIA 09/04/2009  . PERIPHERAL NEUROPATHY 09/04/2009  . LOW BACK PAIN, CHRONIC 04/08/2007   Ladean Raya, PTA 12/06/2014 11:14 AM DUNFORD, CHRISTINA P, PTA 12/06/2014, 11:12 AM  [facility] [address] [place], [place], [address] Phone: [phone]   Fax:  [phone]

## 2014-12-07 ENCOUNTER — Encounter: Payer: BLUE CROSS/BLUE SHIELD | Admitting: Physical Therapy

## 2014-12-07 DIAGNOSIS — M75122 Complete rotator cuff tear or rupture of left shoulder, not specified as traumatic: Secondary | ICD-10-CM | POA: Diagnosis not present

## 2014-12-07 DIAGNOSIS — Z4789 Encounter for other orthopedic aftercare: Secondary | ICD-10-CM | POA: Diagnosis not present

## 2014-12-12 ENCOUNTER — Ambulatory Visit: Payer: Medicare Other | Admitting: Physical Therapy

## 2014-12-12 DIAGNOSIS — M25512 Pain in left shoulder: Secondary | ICD-10-CM

## 2014-12-12 DIAGNOSIS — M25612 Stiffness of left shoulder, not elsewhere classified: Secondary | ICD-10-CM

## 2014-12-12 NOTE — Therapy (Signed)
[facility] [place], [place], [address] Phone: [phone]   Fax:  [phone]  Physical Therapy Treatment  Patient Details  Name: Cassie White MRN: [id number] Date of Birth: January 24, 1941 Referring Provider:  Hendricks Limes, MD  Encounter Date: 12/12/2014      PT End of Session - 12/12/14 1046    Visit Number 19   Number of Visits 32   Date for PT Re-Evaluation 01/23/15   PT Start Time 1032   PT Stop Time 1117   PT Time Calculation (min) 45 min   Activity Tolerance Patient tolerated treatment well   Behavior During Therapy [employer] for tasks assessed/performed      Past Medical History  Diagnosis Date  . Arthritis     DJD  . Hyperlipidemia   . GERD (gastroesophageal reflux disease)   . Osteoporosis, senile   . Diverticulosis   . Scoliosis   . Peripheral neuropathy   . Hx of cardiovascular stress test     Lexiscan Myoview (2/14):  Low risk, small ant defect likely breast attenuation, no ischemia, EF 69% (no change from 2010).    . Mitral regurgitation     Echo (2/14):  EF 60-65%, Gr 1 DD, mild AI, mild bileaflet MVP, mod post directed MR, mild LAE, PASP 35.  Marland Kitchen MVP (mitral valve prolapse)   . Shoulder pain left    to have surgery 10/02/14 for rotator cuff repair    Past Surgical History  Procedure Laterality Date  . Tonsillectomy    . Vaginal hysterectomy      For Uterine Deviation   . 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 Cholecystectomy  03/2010    Dr.Gross  . Lumpectomy  06/2009    "Fatty Necrosis"  . Breast surgery  Nov 2010    Benign biopsy, right  . Cystocele repair    . Combined hysterectomy vaginal / oophorectomy / a&p repair  9622    uncertain if ovaries were removed or not    There were no vitals filed for this visit.  Visit Diagnosis:  Left shoulder pain  Shoulder stiffness, left      Subjective Assessment - 12/12/14 1047    Subjective States that she is a little  sore this morning.   Limitations Lifting;House hold activities   Currently in Pain? No/denies            [facility] PT Assessment - 12/12/14 0001    Assessment   Medical Diagnosis S/P Rotator Cuff Surgery.   Onset Date 10/02/14                     [facility] Adult PT Treatment/Exercise - 12/12/14 0001    Shoulder Exercises: Supine   Other Supine Exercises Supine small circles x30 reps   Shoulder Exercises: Standing   Protraction Strengthening;Left;Theraband;Other (comment)  x30 reps   Theraband Level (Shoulder Protraction) Level 1 (Yellow)   External Rotation Strengthening;Left;Theraband;Other (comment)  x30 reps   Theraband Level (Shoulder External Rotation) Level 1 (Yellow)   Internal Rotation Strengthening;Left;Theraband;Other (comment)  x30 rpes   Theraband Level (Shoulder Internal Rotation) Level 1 (Yellow)   Extension Strengthening;Left;Weights;Other (comment)  x30 reps   Extension Weight (lbs) 1   Row Strengthening;Left;Theraband;Other (comment)  x30 reps   Theraband Level (Shoulder Row) Level 1 (Yellow)   Shoulder Exercises: Pulleys   Flexion Other (comment)  x4min   Other Pulley Exercises UE ranger for elevation/circles 2x10 each  Other Pulley Exercises Wall ladder x63min   Manual Therapy   Manual Therapy Myofascial release   Myofascial Release STW/TPR to L anterior shoulder and humerus to decrease pain and tightness                  PT Short Term Goals - 11/30/14 1118    PT SHORT TERM GOAL #1   Title STG's=LTG's.   Status On-going           PT Long Term Goals - 12/06/14 1110    PT LONG TERM GOAL #1   Title Independent with an advanced HEP.   Time 4   Period Weeks   Status On-going   PT LONG TERM GOAL #2   Title Active left shoulder flexion to 150 degrees so the patient can easily reach overhead   Time 4   Period Weeks   Status On-going  155 degrees in supine   PT LONG TERM GOAL #3   Title Active ER to 70 degrees+ to allow for  easily donning/doffing of apparel   Time 4   Period Weeks   Status On-going  68 degrees in supine   PT LONG TERM GOAL #4   Title Increase ROM so patient is able to reach behind back to L3 with left hand   Time 4   Period Weeks   Status On-going   PT LONG TERM GOAL #5   Title Increase left shoulder strength to a solid 4+/5 to increase stability for performance of functional activities   Time 4   Period Weeks   Status On-going   PT LONG TERM GOAL #6   Title Perform ADL's with pain not > 2-3/10.   Time 4   Period Weeks   Status Achieved               Plan - 12/12/14 1129    Clinical Impression Statement Patient tolerated treatment well other than complaints of soreness during exercises. Tolerated strengthening exercises well. Stabilization exercises were attempted but could not be completed secondary to soreness. Manual therapy on L anterior shoulder and humerus to decrease soreness, tightness and pain associated with the region. Goals remain on-going at this time. Experienced shoulder "feeling much better" following treatment.   Pt will benefit from skilled therapeutic intervention in order to improve on the following deficits Decreased strength;Pain;Impaired UE functional use;Decreased range of motion   Rehab Potential Excellent   Clinical Impairments Affecting Rehab Potential L shoulder surgery date 10/02/2014 (will be 9 weeks post-op 12/04/2014)   PT Frequency 3x / week   PT Duration 4 weeks   PT Treatment/Interventions Electrical Stimulation;Moist Heat;Passive range of motion   PT Next Visit Plan will continue with POC per MD   PT Home Exercise Plan pendulum, PROM cane for er   Consulted and Agree with Plan of Care Patient        Problem List Patient Active Problem List   Diagnosis Date Noted  . Acute bronchitis 09/08/2014  . Mitral insufficiency 05/12/2014  . HTN (hypertension) 04/28/2014  . Lactic acidosis 09/07/2013  . Mitral valve prolapse 06/14/2013  . Chest  pain, exertional 09/13/2012  . Pernicious anemia 09/07/2012  . DIVERTICULOSIS, COLON 09/06/2009  . Osteoporosis 09/06/2009  . HYPERLIPIDEMIA 09/04/2009  . PERIPHERAL NEUROPATHY 09/04/2009  . LOW BACK PAIN, CHRONIC 04/08/2007    Wynelle Fanny, PTA 12/12/2014, 11:35 AM  [facility] [address] [place], [place], [address] Phone: [phone]   Fax:  [phone]

## 2014-12-14 ENCOUNTER — Ambulatory Visit: Payer: Medicare Other | Admitting: Physical Therapy

## 2014-12-14 ENCOUNTER — Encounter: Payer: Self-pay | Admitting: Physical Therapy

## 2014-12-14 DIAGNOSIS — M25612 Stiffness of left shoulder, not elsewhere classified: Secondary | ICD-10-CM | POA: Diagnosis not present

## 2014-12-14 DIAGNOSIS — M25512 Pain in left shoulder: Secondary | ICD-10-CM | POA: Diagnosis not present

## 2014-12-14 NOTE — Therapy (Signed)
[facility] [place], [place], [address] Phone: [phone]   Fax:  [phone]  Physical Therapy Treatment  Patient Details  Name: Cassie White MRN: [id number] Date of Birth: [birth year] Referring Provider:  Hendricks Limes, MD  Encounter Date: 12/14/2014      PT End of Session - 12/14/14 1035    Visit Number 20   Number of Visits 32   Date for PT Re-Evaluation 01/23/15   PT Start Time 1034   PT Stop Time 1117   PT Time Calculation (min) 43 min   Activity Tolerance Patient tolerated treatment well   Behavior During Therapy [facility] for tasks assessed/performed      Past Medical History  Diagnosis Date  . Arthritis     DJD  . Hyperlipidemia   . GERD (gastroesophageal reflux disease)   . Osteoporosis, senile   . Diverticulosis   . Scoliosis   . Peripheral neuropathy   . Hx of cardiovascular stress test     Lexiscan Myoview (2/14):  Low risk, small ant defect likely breast attenuation, no ischemia, EF 69% (no change from 2010).    . Mitral regurgitation     Echo (2/14):  EF 60-65%, Gr 1 DD, mild AI, mild bileaflet MVP, mod post directed MR, mild LAE, PASP 35.  Marland Kitchen MVP (mitral valve prolapse)   . Shoulder pain left    to have surgery 10/02/14 for rotator cuff repair    Past Surgical History  Procedure Laterality Date  . Tonsillectomy    . Vaginal hysterectomy      For Uterine Deviation   . 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 Cholecystectomy  03/2010    Dr.Gross  . Lumpectomy  06/2009    "Fatty Necrosis"  . Breast surgery  Nov 2010    Benign biopsy, right  . Cystocele repair    . Combined hysterectomy vaginal / oophorectomy / a&p repair  3382    uncertain if ovaries were removed or not    There were no vitals filed for this visit.  Visit Diagnosis:  Left shoulder pain  Shoulder stiffness, left      Subjective Assessment - 12/14/14 1035    Subjective States that shoulder has a  little bit of soreness but no pain. States she may have done more than she was supposed to in getting ready for company this weekend.   Limitations Lifting;House hold activities   Currently in Pain? No/denies            [facility] PT Assessment - 12/14/14 0001    Assessment   Medical Diagnosis S/P Rotator Cuff Surgery.   Onset Date 10/02/14   Next MD Visit 01/18/2015                     [facility] Adult PT Treatment/Exercise - 12/14/14 0001    Shoulder Exercises: Supine   Flexion AROM;20 reps;Other (comment)  Lawnchair progression with overpressure at L bicep tendon   Other Supine Exercises Supine small circles x30 reps, ABCs x2 reps, wall wash x30 reps   Shoulder Exercises: Standing   Protraction Strengthening;Left;Theraband;Other (comment)  x30 reps   Theraband Level (Shoulder Protraction) Level 1 (Yellow)   External Rotation Strengthening;Left;Theraband;Other (comment)  x30 reps   Theraband Level (Shoulder External Rotation) Level 1 (Yellow)   Internal Rotation Strengthening;Left;Theraband;Other (comment)  x30 reps   Theraband Level (Shoulder Internal Rotation) Level 1 (Yellow)   Extension Strengthening;Left;Weights;Other (comment)  x30 reps   Extension Weight (lbs) 1   Row Strengthening;Left;Theraband;Other (comment)  x30 reps   Theraband Level (Shoulder Row) Level 1 (Yellow)   Row Weight (lbs) 1   Shoulder Exercises: Pulleys   Flexion Other (comment)  5 minutes   Other Pulley Exercises UE ranger for elevation/circles 2x10 each   Other Pulley Exercises Wall ladder x32min   Shoulder Exercises: ROM/Strengthening   Proximal Shoulder Strengthening, Supine flex/ext, ER/IR                PT Education - 12/14/14 1103    Education provided Yes   Education Details HEP: RW4 with yellow theraband   Person(s) Educated Patient   Methods Explanation;Demonstration;Tactile cues;Verbal cues;Handout   Comprehension Verbalized understanding;Returned demonstration           PT Short Term Goals - 11/30/14 1118    PT SHORT TERM GOAL #1   Title STG's=LTG's.   Status On-going           PT Long Term Goals - 12/14/14 1120    PT LONG TERM GOAL #1   Title Independent with an advanced HEP.   Time 4   Period Weeks   Status On-going   PT LONG TERM GOAL #2   Title Active left shoulder flexion to 150 degrees so the patient can easily reach overhead   Time 4   Period Weeks   Status On-going  155 degrees in supine   PT LONG TERM GOAL #3   Title Active ER to 70 degrees+ to allow for easily donning/doffing of apparel   Time 4   Period Weeks   Status On-going  68 degrees in supine   PT LONG TERM GOAL #4   Title Increase ROM so patient is able to reach behind back to L3 with left hand   Time 4   Period Weeks   Status On-going   PT LONG TERM GOAL #5   Title Increase left shoulder strength to a solid 4+/5 to increase stability for performance of functional activities   Time 4   Period Weeks   Status On-going   PT LONG TERM GOAL #6   Title Perform ADL's with pain not > 2-3/10.   Time 4   Period Weeks   Status Achieved               Plan - 12/14/14 1121    Clinical Impression Statement Patient tolerated treatment well with only complaints of pain during lawnchair AROM flexion. Tolerated and welcomed strengthening HEP. Stabilization demonstrates improvement from the initation of exercises.  Overpressure was applied over L bicep tendon during lawnchair AROM flexion and patient was able to complete 20 reps. Goals remain on-going at this time. Denied pain following treatment only slight soreness.   Pt will benefit from skilled therapeutic intervention in order to improve on the following deficits Decreased strength;Pain;Impaired UE functional use;Decreased range of motion   Rehab Potential Excellent   Clinical Impairments Affecting Rehab Potential L shoulder surgery date 10/02/2014 (will be 9 weeks post-op 12/04/2014)   PT Frequency 3x / week    PT Duration 4 weeks   PT Treatment/Interventions Electrical Stimulation;Moist Heat;Passive range of motion   PT Next Visit Plan will continue with POC per MD   PT Home Exercise Plan RW4 with yellow theraband   Consulted and Agree with Plan of Care Patient        Problem List Patient Active Problem List   Diagnosis Date Noted  . Acute bronchitis 09/08/2014  .  Mitral insufficiency 05/12/2014  . HTN (hypertension) 04/28/2014  . Lactic acidosis 09/07/2013  . Mitral valve prolapse 06/14/2013  . Chest pain, exertional 09/13/2012  . Pernicious anemia 09/07/2012  . DIVERTICULOSIS, COLON 09/06/2009  . Osteoporosis 09/06/2009  . HYPERLIPIDEMIA 09/04/2009  . PERIPHERAL NEUROPATHY 09/04/2009  . LOW BACK PAIN, CHRONIC 04/08/2007    Ahmed Prima, PTA 12/14/2014 11:26 AM   [facility] [address] [place], [place], [address] Phone: [phone]   Fax:  [phone]

## 2014-12-14 NOTE — Patient Instructions (Signed)
Strengthening: Resisted External Rotation   Hold tubing in right hand, elbow at side and forearm across body. Rotate forearm out. Repeat _10___ times per set. Do _3___ sets per session. Do __2__ sessions per day.  http://orth.exer.us/828   Copyright  VHI. All rights reserved.  Strengthening: Resisted Internal Rotation   Hold tubing in left hand, elbow at side and forearm out. Rotate forearm in across body. Repeat __10__ times per set. Do _3___ sets per session. Do _2___ sessions per day.  http://orth.exer.us/830   Copyright  VHI. All rights reserved.  Strengthening: Resisted Flexion   Hold tubing with left arm at side. Pull forward and up. Move shoulder through pain-free range of motion. Repeat _10___ times per set. Do _3___ sets per session. Do __2__ sessions per day.  http://orth.exer.us/824   Copyright  VHI. All rights reserved.  Strengthening: Resisted Extension   Hold tubing in right hand, arm forward. Pull arm back, elbow straight. Repeat _10__ times per set. Do __3__ sets per session. Do _2___ sessions per day.  http://orth.exer.us/832   Copyright  VHI. All rights reserved.

## 2014-12-19 ENCOUNTER — Encounter: Payer: Self-pay | Admitting: *Deleted

## 2014-12-19 ENCOUNTER — Ambulatory Visit: Payer: Medicare Other | Admitting: *Deleted

## 2014-12-19 DIAGNOSIS — M25512 Pain in left shoulder: Secondary | ICD-10-CM | POA: Diagnosis not present

## 2014-12-19 DIAGNOSIS — M25612 Stiffness of left shoulder, not elsewhere classified: Secondary | ICD-10-CM

## 2014-12-19 NOTE — Therapy (Signed)
[facility] [place], [place], [address] Phone: [phone]   Fax:  [phone]  Physical Therapy Treatment  Patient Details  Name: Cassie White MRN: [id number] Date of Birth: 1941/01/05 Referring Provider:  Hendricks Limes, MD  Encounter Date: 12/19/2014      PT End of Session - 12/19/14 1104    Visit Number 21   Number of Visits 32   Date for PT Re-Evaluation 01/23/15   PT Start Time 1030   PT Stop Time 1118   PT Time Calculation (min) 48 min      Past Medical History  Diagnosis Date  . Arthritis     DJD  . Hyperlipidemia   . GERD (gastroesophageal reflux disease)   . Osteoporosis, senile   . Diverticulosis   . Scoliosis   . Peripheral neuropathy   . Hx of cardiovascular stress test     Lexiscan Myoview (2/14):  Low risk, small ant defect likely breast attenuation, no ischemia, EF 69% (no change from 2010).    . Mitral regurgitation     Echo (2/14):  EF 60-65%, Gr 1 DD, mild AI, mild bileaflet MVP, mod post directed MR, mild LAE, PASP 35.  Marland Kitchen MVP (mitral valve prolapse)   . Shoulder pain left    to have surgery 10/02/14 for rotator cuff repair    Past Surgical History  Procedure Laterality Date  . Tonsillectomy    . Vaginal hysterectomy      For Uterine Deviation   . 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 Cholecystectomy  03/2010    Dr.Gross  . Lumpectomy  06/2009    "Fatty Necrosis"  . Breast surgery  Nov 2010    Benign biopsy, right  . Cystocele repair    . Combined hysterectomy vaginal / oophorectomy / a&p repair  3546    uncertain if ovaries were removed or not    There were no vitals filed for this visit.  Visit Diagnosis:  Left shoulder pain  Shoulder stiffness, left      Subjective Assessment - 12/19/14 1043    Subjective States that shoulder has a little bit of soreness but little  pain.   Limitations Lifting;House hold activities   Pain Score 2    Pain Location  Shoulder   Pain Orientation Left   Pain Descriptors / Indicators Sore   Pain Type Surgical pain   Pain Onset More than a month ago   Pain Frequency Intermittent   Aggravating Factors  certain movements   Pain Relieving Factors rest                         [facility] Adult PT Treatment/Exercise - 12/19/14 0001    Exercises   Exercises Shoulder   Shoulder Exercises: Supine   Flexion AROM;20 reps;Other (comment)   Shoulder Exercises: Prone   Extension Strengthening;Weights;20 reps  1#,  ROWS 2x10 1#   Horizontal ABduction 1 AROM  3x10   Shoulder Exercises: Standing   External Rotation Strengthening;Left;Theraband;Other (comment)   Theraband Level (Shoulder External Rotation) Level 1 (Yellow)  2x10   Internal Rotation Strengthening;Left;Theraband;Other (comment)   Theraband Level (Shoulder Internal Rotation) Level 1 (Yellow)  2x10   Flexion Strengthening;Left;20 reps  2x10   Theraband Level (Shoulder Flexion) Level 1 (Yellow)   Row Strengthening;Left;Theraband;Other (comment)   Theraband Level (Shoulder Row) Level 1 (Yellow)  2x10   Shoulder Exercises: Pulleys   Flexion  3 minutes   Other Pulley Exercises UE ranger for elevation/circles 3x10 each   Manual Therapy   Passive ROM passive assistive ROM for flexion and ER wirh gentle range then rhythmic stabilization for IR/ER in scaption and flex/ext @90                   PT Short Term Goals - 11/30/14 1118    PT SHORT TERM GOAL #1   Title STG's=LTG's.   Status On-going           PT Long Term Goals - 12/14/14 1120    PT LONG TERM GOAL #1   Title Independent with an advanced HEP.   Time 4   Period Weeks   Status On-going   PT LONG TERM GOAL #2   Title Active left shoulder flexion to 150 degrees so the patient can easily reach overhead   Time 4   Period Weeks   Status On-going  155 degrees in supine   PT LONG TERM GOAL #3   Title Active ER to 70 degrees+ to allow for easily donning/doffing of  apparel   Time 4   Period Weeks   Status On-going  68 degrees in supine   PT LONG TERM GOAL #4   Title Increase ROM so patient is able to reach behind back to L3 with left hand   Time 4   Period Weeks   Status On-going   PT LONG TERM GOAL #5   Title Increase left shoulder strength to a solid 4+/5 to increase stability for performance of functional activities   Time 4   Period Weeks   Status On-going   PT LONG TERM GOAL #6   Title Perform ADL's with pain not > 2-3/10.   Time 4   Period Weeks   Status Achieved               Plan - 12/19/14 1305    Pt will benefit from skilled therapeutic intervention in order to improve on the following deficits Decreased strength;Pain;Impaired UE functional use;Decreased range of motion   Rehab Potential Excellent   Clinical Impairments Affecting Rehab Potential L shoulder surgery date 10/02/2014 (will be 11 weeks post-op 12/18/2014)   PT Frequency 3x / week   PT Duration 4 weeks   PT Treatment/Interventions Electrical Stimulation;Moist Heat;Passive range of motion   PT Next Visit Plan will continue with POC per MD   PT Home Exercise Plan RW4 with yellow theraband, supine flexion        Problem List Patient Active Problem List   Diagnosis Date Noted  . Acute bronchitis 09/08/2014  . Mitral insufficiency 05/12/2014  . HTN (hypertension) 04/28/2014  . Lactic acidosis 09/07/2013  . Mitral valve prolapse 06/14/2013  . Chest pain, exertional 09/13/2012  . Pernicious anemia 09/07/2012  . DIVERTICULOSIS, COLON 09/06/2009  . Osteoporosis 09/06/2009  . HYPERLIPIDEMIA 09/04/2009  . PERIPHERAL NEUROPATHY 09/04/2009  . LOW BACK PAIN, CHRONIC 04/08/2007    RAMSEUR,CHRIS, PTA 12/19/2014, 1:09 PM  [facility] [place], [place], [address] Phone: [phone]   Fax:  [phone]

## 2014-12-21 ENCOUNTER — Encounter: Payer: Self-pay | Admitting: *Deleted

## 2014-12-21 ENCOUNTER — Ambulatory Visit: Payer: Medicare Other | Admitting: *Deleted

## 2014-12-21 DIAGNOSIS — M25612 Stiffness of left shoulder, not elsewhere classified: Secondary | ICD-10-CM | POA: Diagnosis not present

## 2014-12-21 DIAGNOSIS — M25512 Pain in left shoulder: Secondary | ICD-10-CM

## 2014-12-21 NOTE — Therapy (Signed)
[facility] [place], [place], [address] Phone: [phone]   Fax:  [phone]  Physical Therapy Treatment  Patient Details  Name: Cassie White MRN: [id number] Date of Birth: 08-27-1940 Referring Provider:  Hendricks Limes, MD  Encounter Date: 12/21/2014      PT End of Session - 12/21/14 1204    Visit Number 22   Number of Visits 32   Date for PT Re-Evaluation 01/23/15   PT Start Time 1116   PT Stop Time 1205   PT Time Calculation (min) 49 min      Past Medical History  Diagnosis Date  . Arthritis     DJD  . Hyperlipidemia   . GERD (gastroesophageal reflux disease)   . Osteoporosis, senile   . Diverticulosis   . Scoliosis   . Peripheral neuropathy   . Hx of cardiovascular stress test     Lexiscan Myoview (2/14):  Low risk, small ant defect likely breast attenuation, no ischemia, EF 69% (no change from 2010).    . Mitral regurgitation     Echo (2/14):  EF 60-65%, Gr 1 DD, mild AI, mild bileaflet MVP, mod post directed MR, mild LAE, PASP 35.  Marland Kitchen MVP (mitral valve prolapse)   . Shoulder pain left    to have surgery 10/02/14 for rotator cuff repair    Past Surgical History  Procedure Laterality Date  . Tonsillectomy    . Vaginal hysterectomy      For Uterine Deviation   . 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 Cholecystectomy  03/2010    Dr.Gross  . Lumpectomy  06/2009    "Fatty Necrosis"  . Breast surgery  Nov 2010    Benign biopsy, right  . Cystocele repair    . Combined hysterectomy vaginal / oophorectomy / a&p repair  7939    uncertain if ovaries were removed or not    There were no vitals filed for this visit.  Visit Diagnosis:  Left shoulder pain  Shoulder stiffness, left      Subjective Assessment - 12/21/14 1129    Subjective Lt arm did great yesterday, but is sore today 5/10   Limitations Lifting;House hold activities   Currently in Pain? Yes   Pain Score 5    Pain  Location Shoulder   Pain Orientation Left   Pain Descriptors / Indicators Sore   Pain Type Surgical pain   Pain Onset More than a month ago   Aggravating Factors  certain mvmnts   Pain Relieving Factors rest            [facility] PT Assessment - 12/21/14 0001    PROM   Left Shoulder Flexion 156 Degrees   Left Shoulder External Rotation 70 Degrees                     [facility] Adult PT Treatment/Exercise - 12/21/14 0001    Exercises   Exercises Shoulder   Shoulder Exercises: Supine   Flexion AROM;20 reps;Other (comment)   Shoulder Exercises: Prone   Extension Strengthening;Weights;20 reps  1#, and Rows 1# 2x10   Horizontal ABduction 1 AROM;20 reps   Shoulder Exercises: Standing   External Rotation Strengthening;Left;Theraband;Other (comment)  still challenging for Pt   Theraband Level (Shoulder External Rotation) Level 1 (Yellow)  2x10    Internal Rotation Strengthening;Left;Theraband;Other (comment)   Theraband Level (Shoulder Internal Rotation) Level 1 (Yellow)  2x10   Flexion Strengthening;Left;20 reps  Punches 2x10   Theraband Level (Shoulder Flexion) Level 1 (Yellow)   Row Strengthening;Left;Theraband;Other (comment);20 reps   Theraband Level (Shoulder Row) Level 1 (Yellow)   Shoulder Exercises: Pulleys   Flexion 3 minutes   Other Pulley Exercises UE ranger for elevation/circles 3x10 each   Manual Therapy   Passive ROM passive assistive ROM for flexion and ER wirh gentle range then rhythmic stabilization for IR/ER in scaption and flex/ext @90  and 100 degrees                  PT Short Term Goals - 11/30/14 1118    PT SHORT TERM GOAL #1   Title STG's=LTG's.   Status On-going           PT Long Term Goals - 12/14/14 1120    PT LONG TERM GOAL #1   Title Independent with an advanced HEP.   Time 4   Period Weeks   Status On-going   PT LONG TERM GOAL #2   Title Active left shoulder flexion to 150 degrees so the patient can easily reach  overhead   Time 4   Period Weeks   Status On-going  155 degrees in supine   PT LONG TERM GOAL #3   Title Active ER to 70 degrees+ to allow for easily donning/doffing of apparel   Time 4   Period Weeks   Status On-going  68 degrees in supine   PT LONG TERM GOAL #4   Title Increase ROM so patient is able to reach behind back to L3 with left hand   Time 4   Period Weeks   Status On-going   PT LONG TERM GOAL #5   Title Increase left shoulder strength to a solid 4+/5 to increase stability for performance of functional activities   Time 4   Period Weeks   Status On-going   PT LONG TERM GOAL #6   Title Perform ADL's with pain not > 2-3/10.   Time 4   Period Weeks   Status Achieved               Plan - 12/21/14 1130    Clinical Impression Statement pt  did well today and was able to raise LT UE to 145 degrees and PROM to 155 degrees. She still has some mild tightness in LT shldr with mild ROM deficits. Less pain after Rx.   Pt will benefit from skilled therapeutic intervention in order to improve on the following deficits Decreased strength;Pain;Impaired UE functional use;Decreased range of motion   Rehab Potential Excellent   Clinical Impairments Affecting Rehab Potential L shoulder surgery date 10/02/2014 (will be 11 weeks post-op 12/18/2014)   PT Frequency 3x / week   PT Duration 4 weeks   PT Treatment/Interventions Electrical Stimulation;Moist Heat;Passive range of motion   PT Next Visit Plan will continue with POC per MD   PT Home Exercise Plan RW4 with yellow theraband, supine flexion        Problem List Patient Active Problem List   Diagnosis Date Noted  . Acute bronchitis 09/08/2014  . Mitral insufficiency 05/12/2014  . HTN (hypertension) 04/28/2014  . Lactic acidosis 09/07/2013  . Mitral valve prolapse 06/14/2013  . Chest pain, exertional 09/13/2012  . Pernicious anemia 09/07/2012  . DIVERTICULOSIS, COLON 09/06/2009  . Osteoporosis 09/06/2009  .  HYPERLIPIDEMIA 09/04/2009  . PERIPHERAL NEUROPATHY 09/04/2009  . LOW BACK PAIN, CHRONIC 04/08/2007    Ryatt Corsino,CHRIS, PTA 12/21/2014, 12:10 PM  [facility] [address]  [place], [place], [address] Phone: [phone]   Fax:  [phone]

## 2014-12-25 ENCOUNTER — Encounter: Payer: Self-pay | Admitting: Family Medicine

## 2014-12-25 ENCOUNTER — Ambulatory Visit (INDEPENDENT_AMBULATORY_CARE_PROVIDER_SITE_OTHER): Payer: Medicare Other | Admitting: Family Medicine

## 2014-12-25 VITALS — BP 128/78 | HR 59 | Temp 98.0°F | Resp 16 | Ht 61.75 in | Wt 144.0 lb

## 2014-12-25 DIAGNOSIS — Z1211 Encounter for screening for malignant neoplasm of colon: Secondary | ICD-10-CM | POA: Diagnosis not present

## 2014-12-25 DIAGNOSIS — Z23 Encounter for immunization: Secondary | ICD-10-CM

## 2014-12-25 NOTE — Progress Notes (Signed)
Office Note 12/25/2014  CC:  Chief Complaint  Patient presents with  . Establish Care    HPI:  Cassie White is a 74 y.o. White female who is here to transfer care from Dr. Linna Darner at [facility] (retiring).  Old records in EPIC/HL were reviewed prior to or during today's visit.  Monitors bp only once every 2-3 mo, normal every time.    She has an idiopathic PN of LE's that she says is fairly well controlled on gabapentin, gets occasional neurologist f/u. Has MVP with mild/mod regurg: most recent echo 2014 stable, cardiologist plans re-echo q 3-4 years unless clinical change noted (Dr. Martinique).  Hx of exertional chest tightness: resolved on metoprolol low dose.  She has never had to use the sl nitro she has. Stress testing was neg.   Past Medical History  Diagnosis Date  . Arthritis     DJD, back and both hips  . Hyperlipidemia   . GERD (gastroesophageal reflux disease)   . Osteoporosis, senile     stable at the left hip 2014-2015.  Took fosamax for 10 yrs.  Plan: get repeat DEXA q28mo.  . Diverticulosis     on colonoscopy 2004  . Scoliosis   . Peripheral neuropathy 2008    Idiopathic vs familial (motor>sensory): gabapentin helpful  . Hx of cardiovascular stress test     Lexiscan Myoview (2/14):  Low risk, small ant defect likely breast attenuation, no ischemia, EF 69% (no change from 2010).    . Mitral regurgitation     Echo (2/14):  EF 60-65%, Gr 1 DD, mild AI, mild bileaflet MVP, mod post directed MR, mild LAE, PASP 35.  Marland Kitchen MVP (mitral valve prolapse)   . Shoulder pain left    to have surgery 10/02/14 for rotator cuff repair    Past Surgical History  Procedure Laterality Date  . Tonsillectomy    . Vaginal hysterectomy      For Uterine Deviation   . 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 Cholecystectomy  03/2010    Dr.Gross  . Lumpectomy  06/2009    "Fatty Necrosis"  . Breast surgery  Nov 2010    Benign biopsy, right  . Cystocele repair     . Combined hysterectomy vaginal / oophorectomy / a&p repair  6761    uncertain if ovaries were removed or not  . Colonoscopy  10/03/2002    No polyps.  Repeat 10 yrs (Dr. Olevia Perches)  . Pft's  2014    NORMAL  . Rotator cuff repair Left 10/02/14    Dr. Veverly Fells    Family History  Problem Relation Age of Onset  . Heart attack Father     in 42s  . Stroke Father 25  . Ovarian cancer Mother   . Coronary artery disease Brother   . Heart attack Son 23    Smoker  . Diabetes Neg Hx     History   Social History  . Marital Status: Married    Spouse Name: N/A  . Number of Children: 3  . Years of Education: HS grad   Occupational History  . [employer]- bus driver- retired    Social History Main Topics  . Smoking status: Never Smoker   . Smokeless tobacco: Not on file  . Alcohol Use: No  . Drug Use: No  . Sexual Activity: No   Other Topics Concern  . Not on file   Social History Narrative  Widow, has 3 children, 4 grandchildren.   Orig from [place].   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   Caffiene, 1 cup daily avg.   No tob, no alc, no drugs.   Exercise: walking, limited by bilat hip pain.    Outpatient Encounter Prescriptions as of 12/25/2014  Medication Sig  . aspirin 81 MG tablet Take 81 mg by mouth daily.    . B Complex-C (B-COMPLEX WITH VITAMIN C) tablet Take 1 tablet by mouth daily.  . Biotin 5000 MCG CAPS Take 5,000 mcg by mouth daily.  . Calcium Carbonate (CALCIUM 600 PO) Take 600 mg by mouth 2 (two) times daily.   . cholecalciferol (VITAMIN D) 1000 UNITS tablet Take 5,000 Units by mouth daily.   . cyanocobalamin (,VITAMIN B-12,) 1000 MCG/ML injection Inject 1,000 mcg into the muscle every 30 (thirty) days.  Marland Kitchen esterified estrogens (MENEST) 0.625 MG tablet Take 0.625 mg by mouth daily.  Marland Kitchen gabapentin (NEURONTIN) 300 MG capsule TAKE 1 CAPSULE BY MOUTH AT LUNCH, 1 AT DINNER, AND 2 AT BEDTIME  . metoprolol tartrate (LOPRESSOR) 25  MG tablet Take 0.5 tablets (12.5 mg total) by mouth 2 (two) times daily.  . nitroGLYCERIN (NITROSTAT) 0.4 MG SL tablet Place 1 tablet (0.4 mg total) under the tongue every 5 (five) minutes as needed for chest pain.  . Omega-3 Fatty Acids (FISH OIL) 1200 MG CAPS Take 1,000 mg by mouth 2 (two) times daily.   . pravastatin (PRAVACHOL) 20 MG tablet Take 20 mg by mouth 3 (three) times a week. Take on Monday, Wednesday, and friday  . zolpidem (AMBIEN) 10 MG tablet Take 10 mg by mouth at bedtime as needed for sleep. Takes half 10mg  tablet at bedtime  . magnesium gluconate (MAGONATE) 30 MG tablet Take 1 tablet (30 mg total) by mouth 2 (two) times daily. (Patient not taking: Reported on 12/25/2014)  . [DISCONTINUED] amoxicillin (AMOXIL) 875 MG tablet Take 875 mg by mouth 2 (two) times daily.    No facility-administered encounter medications on file as of 12/25/2014.    Allergies  Allergen Reactions  . Sulfa Antibiotics Swelling    face    ROS Review of Systems  Constitutional: Negative for fever and fatigue.  HENT: Negative for congestion and sore throat.   Eyes: Negative for visual disturbance.  Respiratory: Negative for cough.   Cardiovascular: Negative for chest pain.  Gastrointestinal: Negative for nausea and abdominal pain.  Genitourinary: Negative for dysuria.  Musculoskeletal: Positive for arthralgias (still rehabing left shoulder s/p RC repair 09/2014). Negative for back pain and joint swelling.  Skin: Negative for rash.  Neurological: Negative for weakness and headaches.  Hematological: Negative for adenopathy.    PE; Blood pressure 128/78, pulse 59, temperature 98 F (36.7 C), temperature source Oral, resp. rate 16, height 5' 1.75" (1.568 m), weight 144 lb (65.318 kg), SpO2 93 %. Gen: Alert, well appearing.  Patient is oriented to person, place, time, and situation. EYC:XKGY: no injection, icteris, swelling, or exudate.  EOMI, PERRLA. Mouth: lips without lesion/swelling.  Oral  mucosa pink and moist. Oropharynx without erythema, exudate, or swelling.  Neck - No masses or thyromegaly or limitation in range of motion CV: RRR, midsystolic click with 1-8/5 systolic murmur, S1 slightly obscured by the murmur.  S2 very distinct.  No diastolic murmur. Chest is clear, no wheezing or rales. Normal symmetric air entry throughout both lung fields. No chest wall deformities or tenderness. EXT: no clubbing, cyanosis, or edema.  Pertinent labs:   Lab Results  Component Value Date   TSH 1.41 05/12/2014   Lab Results  Component Value Date   WBC 7.3 04/28/2014   HGB 13.7 04/28/2014   HCT 40.4 04/28/2014   MCV 89.3 04/28/2014   PLT 174.0 04/28/2014   Lab Results  Component Value Date   CREATININE 0.8 04/28/2014   BUN 10 04/28/2014   NA 139 04/28/2014   K 3.8 04/28/2014   CL 105 04/28/2014   CO2 28 04/28/2014   Lab Results  Component Value Date   ALT 33 04/28/2014   AST 31 04/28/2014   ALKPHOS 52 04/28/2014   BILITOT 0.7 04/28/2014   Lab Results  Component Value Date   CHOL 211* 04/28/2014   Lab Results  Component Value Date   HDL 101 04/28/2014   Lab Results  Component Value Date   LDLCALC 91 04/28/2014   Lab Results  Component Value Date   TRIG 95 04/28/2014   Lab Results  Component Value Date   CHOLHDL 2 09/07/2012   ASSESSMENT AND PLAN:   1) Hyperlipidemia; LDL 91, HDL 101, AST/ALT normal 04/29/15. Will repeat at next f/u in 6 mo.  Continue statin.  2) MVP/mitral regurg: asymptomatic/stable at f/u echo 2014. Continue routine cardiology f/u, tentative plan is re-echo 2017 or 2018 to monitor.  3) Hx of exertional chest tightness: resolved on metoprolol. Stress testing neg. Continue aspirin and metoprolol.  4) Preventative health care: prevnar 13 IM given today.  An After Visit Summary was printed and given to the patient.  Return in about 6 months (around 06/27/2015) for annual medicare wellness visit.

## 2014-12-25 NOTE — Progress Notes (Signed)
Pre visit review using our clinic review tool, if applicable. No additional management support is needed unless otherwise documented below in the visit note. 

## 2014-12-26 ENCOUNTER — Encounter: Payer: Self-pay | Admitting: *Deleted

## 2014-12-26 ENCOUNTER — Ambulatory Visit: Payer: Medicare Other | Admitting: *Deleted

## 2014-12-26 ENCOUNTER — Encounter: Payer: Self-pay | Admitting: Internal Medicine

## 2014-12-26 DIAGNOSIS — M25612 Stiffness of left shoulder, not elsewhere classified: Secondary | ICD-10-CM | POA: Diagnosis not present

## 2014-12-26 DIAGNOSIS — M25512 Pain in left shoulder: Secondary | ICD-10-CM

## 2014-12-26 NOTE — Therapy (Signed)
[facility] [place], [place], [address] Phone: [phone]   Fax:  [phone]  Physical Therapy Treatment  Patient Details  Name: Cassie White MRN: [id number] Date of Birth: [birth year] Referring Provider:  Hendricks Limes, MD  Encounter Date: 12/26/2014      PT End of Session - 12/26/14 1123    Visit Number 23   Number of Visits 32   Date for PT Re-Evaluation 01/23/15   PT Start Time 1030   PT Stop Time 1121   PT Time Calculation (min) 51 min      Past Medical History  Diagnosis Date  . Arthritis     DJD, back and both hips  . Hyperlipidemia   . GERD (gastroesophageal reflux disease)   . Osteoporosis, senile     stable at the left hip 2014-2015.  Took fosamax for 10 yrs.  Plan: get repeat DEXA q40mo.  . Diverticulosis     on colonoscopy 2004  . Scoliosis   . Peripheral neuropathy 2008    Idiopathic vs familial (motor>sensory): gabapentin helpful  . Hx of cardiovascular stress test     Lexiscan Myoview (2/14):  Low risk, small ant defect likely breast attenuation, no ischemia, EF 69% (no change from 2010).    . Mitral regurgitation     Echo (2/14):  EF 60-65%, Gr 1 DD, mild AI, mild bileaflet MVP, mod post directed MR, mild LAE, PASP 35.  Marland Kitchen MVP (mitral valve prolapse)   . Shoulder pain left    to have surgery 10/02/14 for rotator cuff repair    Past Surgical History  Procedure Laterality Date  . Tonsillectomy    . Vaginal hysterectomy      For Uterine Deviation   . 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 Cholecystectomy  03/2010    Dr.Gross  . Lumpectomy  06/2009    "Fatty Necrosis"  . Breast surgery  Nov 2010    Benign biopsy, right  . Cystocele repair    . Combined hysterectomy vaginal / oophorectomy / a&p repair  6837    uncertain if ovaries were removed or not  . Colonoscopy  10/03/2002    No polyps.  Repeat 10 yrs (Dr. Olevia Perches)  . Pft's  2014    NORMAL  . Rotator cuff repair  Left 10/02/14    Dr. Veverly Fells    There were no vitals filed for this visit.  Visit Diagnosis:  Left shoulder pain  Shoulder stiffness, left      Subjective Assessment - 12/26/14 1044    Subjective Lt shldr doing better. 3/10 today   Limitations Lifting;House hold activities   Currently in Pain? Yes   Pain Score 5    Pain Location Shoulder   Pain Orientation Left   Pain Descriptors / Indicators Sore   Pain Type Surgical pain   Pain Onset More than a month ago   Pain Frequency Intermittent   Aggravating Factors  certain mvmnts   Pain Relieving Factors rest                         [facility] Adult PT Treatment/Exercise - 12/26/14 0001    Shoulder Exercises: Standing   External Rotation Strengthening;Left;Theraband;Other (comment)  still challenging for Pt   Theraband Level (Shoulder External Rotation) Level 1 (Yellow)  2x10    Internal Rotation Strengthening;Left;Theraband;Other (comment)   Theraband Level (Shoulder Internal Rotation) Level 1 (Yellow)  2x10  Flexion Strengthening;Left;20 reps  Punches 2x10   Theraband Level (Shoulder Flexion) Level 1 (Yellow)   Row Strengthening;Left;Theraband;Other (comment);20 reps   Theraband Level (Shoulder Row) Level 1 (Yellow)  2x10   Other Standing Exercises AAROM for flexion to decrease compensatory motions   Shoulder Exercises: Pulleys   Flexion 3 minutes   Other Pulley Exercises UE ranger for elevation/circles 3x10 each   Manual Therapy   Passive ROM passive assistive ROM for flexion and ER wirh gentle range then rhythmic stabilization for IR/ER in scaption and flex/ext @90  and 100 degrees                  PT Short Term Goals - 11/30/14 1118    PT SHORT TERM GOAL #1   Title STG's=LTG's.   Status On-going           PT Long Term Goals - 12/14/14 1120    PT LONG TERM GOAL #1   Title Independent with an advanced HEP.   Time 4   Period Weeks   Status On-going   PT LONG TERM GOAL #2   Title  Active left shoulder flexion to 150 degrees so the patient can easily reach overhead   Time 4   Period Weeks   Status On-going  155 degrees in supine   PT LONG TERM GOAL #3   Title Active ER to 70 degrees+ to allow for easily donning/doffing of apparel   Time 4   Period Weeks   Status On-going  68 degrees in supine   PT LONG TERM GOAL #4   Title Increase ROM so patient is able to reach behind back to L3 with left hand   Time 4   Period Weeks   Status On-going   PT LONG TERM GOAL #5   Title Increase left shoulder strength to a solid 4+/5 to increase stability for performance of functional activities   Time 4   Period Weeks   Status On-going   PT LONG TERM GOAL #6   Title Perform ADL's with pain not > 2-3/10.   Time 4   Period Weeks   Status Achieved               Plan - 12/26/14 1123    Clinical Impression Statement Pt did well with Exs and Acts, but still needs assistance to reach full flexion ROM in standing and ER still has strength deficits   Pt will benefit from skilled therapeutic intervention in order to improve on the following deficits Decreased strength;Pain;Impaired UE functional use;Decreased range of motion   Rehab Potential Excellent   Clinical Impairments Affecting Rehab Potential L shoulder surgery date 10/02/2014 (will be 11 weeks post-op 12/18/2014)   PT Frequency 3x / week   PT Duration 4 weeks   PT Treatment/Interventions Electrical Stimulation;Moist Heat;Passive range of motion   PT Next Visit Plan will continue with POC per MD.         Problem List Patient Active Problem List   Diagnosis Date Noted  . Acute bronchitis 09/08/2014  . Mitral insufficiency 05/12/2014  . HTN (hypertension) 04/28/2014  . Lactic acidosis 09/07/2013  . Mitral valve prolapse 06/14/2013  . Chest pain, exertional 09/13/2012  . Pernicious anemia 09/07/2012  . DIVERTICULOSIS, COLON 09/06/2009  . Osteoporosis 09/06/2009  . HYPERLIPIDEMIA 09/04/2009  . PERIPHERAL  NEUROPATHY 09/04/2009  . LOW BACK PAIN, CHRONIC 04/08/2007    Camila Maita,CHRIS, PTA 12/26/2014, 11:36 AM  [facility] [address] [place], [place], [address]  Phone: [phone]   Fax:  [phone]

## 2014-12-28 ENCOUNTER — Ambulatory Visit: Payer: Medicare Other | Admitting: *Deleted

## 2014-12-28 ENCOUNTER — Encounter: Payer: Self-pay | Admitting: *Deleted

## 2014-12-28 DIAGNOSIS — M25612 Stiffness of left shoulder, not elsewhere classified: Secondary | ICD-10-CM | POA: Diagnosis not present

## 2014-12-28 DIAGNOSIS — M25512 Pain in left shoulder: Secondary | ICD-10-CM | POA: Diagnosis not present

## 2014-12-28 NOTE — Therapy (Signed)
[facility] [place], [place], [address] Phone: [phone]   Fax:  [phone]  Physical Therapy Treatment  Patient Details  Name: Cassie White MRN: [id number] Date of Birth: Sep 16, 1940 Referring Provider:  Hendricks Limes, MD  Encounter Date: 12/28/2014      PT End of Session - 12/28/14 1031    Visit Number 24   Number of Visits 32   Date for PT Re-Evaluation 01/23/15   PT Start Time 1030   PT Stop Time 1119   PT Time Calculation (min) 49 min      Past Medical History  Diagnosis Date  . Arthritis     DJD, back and both hips  . Hyperlipidemia   . GERD (gastroesophageal reflux disease)   . Osteoporosis, senile     stable at the left hip 2014-2015.  Took fosamax for 10 yrs.  Plan: get repeat DEXA q25mo.  . Diverticulosis     on colonoscopy 2004  . Scoliosis   . Peripheral neuropathy 2008    Idiopathic vs familial (motor>sensory): gabapentin helpful  . Hx of cardiovascular stress test     Lexiscan Myoview (2/14):  Low risk, small ant defect likely breast attenuation, no ischemia, EF 69% (no change from 2010).    . Mitral regurgitation     Echo (2/14):  EF 60-65%, Gr 1 DD, mild AI, mild bileaflet MVP, mod post directed MR, mild LAE, PASP 35.  Marland Kitchen MVP (mitral valve prolapse)   . Shoulder pain left    to have surgery 10/02/14 for rotator cuff repair    Past Surgical History  Procedure Laterality Date  . Tonsillectomy    . Vaginal hysterectomy      For Uterine Deviation   . 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 Cholecystectomy  03/2010    Dr.Gross  . Lumpectomy  06/2009    "Fatty Necrosis"  . Breast surgery  Nov 2010    Benign biopsy, right  . Cystocele repair    . Combined hysterectomy vaginal / oophorectomy / a&p repair  1324    uncertain if ovaries were removed or not  . Colonoscopy  10/03/2002    No polyps.  Repeat 10 yrs (Dr. Olevia Perches)  . Pft's  2014    NORMAL  . Rotator cuff repair  Left 10/02/14    Dr. Veverly Fells    There were no vitals filed for this visit.  Visit Diagnosis:  Left shoulder pain  Shoulder stiffness, left      Subjective Assessment - 12/28/14 1034    Subjective Lt shldr doing better. 3/10 today   Limitations Lifting;House hold activities   Currently in Pain? Yes   Pain Score 2    Pain Location Shoulder   Pain Orientation Left   Pain Descriptors / Indicators Sore   Pain Type Surgical pain   Pain Onset More than a month ago   Pain Frequency Intermittent   Aggravating Factors  certain mvmnts   Pain Relieving Factors rest            [facility] PT Assessment - 12/28/14 0001    AROM   Overall AROM  Deficits   Right/Left Shoulder Left   Left Shoulder Flexion 140 Degrees  standing                     [facility] Adult PT Treatment/Exercise - 12/28/14 0001    Exercises   Exercises Shoulder   Shoulder  Exercises: Prone   Extension Strengthening;Weights;20 reps  1#, and Rows 1# 3x10   Horizontal ABduction 1 AROM;20 reps   Shoulder Exercises: Standing   Other Standing Exercises AAROM for flexion to decrease compensatory motions 3x10 then 90 degrees and up 3x10   Other Standing Exercises Bicep curl 2# 3x 10   Shoulder Exercises: Pulleys   Flexion 3 minutes   Other Pulley Exercises UE ranger for elevation/circles 3x10 each   Manual Therapy   Manual Therapy Passive ROM   Passive ROM passive assistive ROM for flexion and ER wirh gentle range then rhythmic stabilization for IR/ER in scaption and flex/ext @90  and 100 degrees.end-range ER isometrics and manual resist ER                                                                                                 Wall push-ups 3x10              PT Short Term Goals - 11/30/14 1118    PT SHORT TERM GOAL #1   Title STG's=LTG's.   Status On-going           PT Long Term Goals - 12/14/14 1120    PT LONG TERM GOAL #1   Title Independent with an advanced HEP.   Time 4   Period  Weeks   Status On-going   PT LONG TERM GOAL #2   Title Active left shoulder flexion to 150 degrees so the patient can easily reach overhead   Time 4   Period Weeks   Status On-going  155 degrees in supine   PT LONG TERM GOAL #3   Title Active ER to 70 degrees+ to allow for easily donning/doffing of apparel   Time 4   Period Weeks   Status On-going  68 degrees in supine   PT LONG TERM GOAL #4   Title Increase ROM so patient is able to reach behind back to L3 with left hand   Time 4   Period Weeks   Status On-going   PT LONG TERM GOAL #5   Title Increase left shoulder strength to a solid 4+/5 to increase stability for performance of functional activities   Time 4   Period Weeks   Status On-going   PT LONG TERM GOAL #6   Title Perform ADL's with pain not > 2-3/10.   Time 4   Period Weeks   Status Achieved               Plan - 12/28/14 1120    Clinical Impression Statement Pt did great again today with EX's and Act's for LT UE. She still lacks strength in ER and is uinable to use FROM with standing elevation and needs assistance. Current goals are ongoing.   Pt will benefit from skilled therapeutic intervention in order to improve on the following deficits Decreased strength;Pain;Impaired UE functional use;Decreased range of motion   Rehab Potential Excellent   Clinical Impairments Affecting Rehab Potential L shoulder surgery date 10/02/2014 (will be 12weeks post-op 12/25/2014)   PT Frequency 3x / week   PT Duration 4  weeks   PT Treatment/Interventions Electrical Stimulation;Moist Heat;Passive range of motion   PT Next Visit Plan will continue with POC per MD. Work on ER rotation strength and anti-gravity elevation.   Consulted and Agree with Plan of Care Patient        Problem List Patient Active Problem List   Diagnosis Date Noted  . Acute bronchitis 09/08/2014  . Mitral insufficiency 05/12/2014  . HTN (hypertension) 04/28/2014  . Lactic acidosis 09/07/2013  .  Mitral valve prolapse 06/14/2013  . Chest pain, exertional 09/13/2012  . Pernicious anemia 09/07/2012  . DIVERTICULOSIS, COLON 09/06/2009  . Osteoporosis 09/06/2009  . HYPERLIPIDEMIA 09/04/2009  . PERIPHERAL NEUROPATHY 09/04/2009  . LOW BACK PAIN, CHRONIC 04/08/2007    RAMSEUR,CHRIS, PTA  12/28/2014, 11:27 AM  [facility] [address] [place], [place], [address] Phone: [phone]   Fax:  [phone]

## 2015-01-02 ENCOUNTER — Ambulatory Visit: Payer: Medicare Other | Admitting: Physical Therapy

## 2015-01-02 ENCOUNTER — Encounter: Payer: Self-pay | Admitting: Physical Therapy

## 2015-01-02 ENCOUNTER — Other Ambulatory Visit: Payer: Self-pay | Admitting: Internal Medicine

## 2015-01-02 DIAGNOSIS — M25612 Stiffness of left shoulder, not elsewhere classified: Secondary | ICD-10-CM

## 2015-01-02 DIAGNOSIS — M25512 Pain in left shoulder: Secondary | ICD-10-CM

## 2015-01-02 NOTE — Therapy (Signed)
[facility] [place], [place], [address] Phone: [phone]   Fax:  [phone]  Physical Therapy Treatment  Patient Details  Name: Cassie White MRN: [id number] Date of Birth: 07/24/1941 Referring Provider:  Tammi Sou, MD  Encounter Date: 01/02/2015      PT End of Session - 01/02/15 1032    Visit Number 25   Number of Visits 32   Date for PT Re-Evaluation 01/23/15   PT Start Time 1030   PT Stop Time 1113   PT Time Calculation (min) 43 min   Activity Tolerance Patient tolerated treatment well   Behavior During Therapy [facility] for tasks assessed/performed      Past Medical History  Diagnosis Date  . Arthritis     DJD, back and both hips  . Hyperlipidemia   . GERD (gastroesophageal reflux disease)   . Osteoporosis, senile     stable at the left hip 2014-2015.  Took fosamax for 10 yrs.  Plan: get repeat DEXA q70mo.  . Diverticulosis     on colonoscopy 2004  . Scoliosis   . Peripheral neuropathy 2008    Idiopathic vs familial (motor>sensory): gabapentin helpful  . Hx of cardiovascular stress test     Lexiscan Myoview (2/14):  Low risk, small ant defect likely breast attenuation, no ischemia, EF 69% (no change from 2010).    . Mitral regurgitation     Echo (2/14):  EF 60-65%, Gr 1 DD, mild AI, mild bileaflet MVP, mod post directed MR, mild LAE, PASP 35.  Marland Kitchen MVP (mitral valve prolapse)   . Shoulder pain left    to have surgery 10/02/14 for rotator cuff repair    Past Surgical History  Procedure Laterality Date  . Tonsillectomy    . Vaginal hysterectomy      For Uterine Deviation   . 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 Cholecystectomy  03/2010    Dr.Gross  . Lumpectomy  06/2009    "Fatty Necrosis"  . Breast surgery  Nov 2010    Benign biopsy, right  . Cystocele repair    . Combined hysterectomy vaginal / oophorectomy / a&p repair  5053    uncertain if ovaries were removed or not  .  Colonoscopy  10/03/2002    No polyps.  Repeat 10 yrs (Dr. Olevia Perches)  . Pft's  2014    NORMAL  . Rotator cuff repair Left 10/02/14    Dr. Veverly Fells    There were no vitals filed for this visit.  Visit Diagnosis:  Left shoulder pain  Shoulder stiffness, left      Subjective Assessment - 01/02/15 1032    Subjective Reports no pain in shoulder today.   Limitations Lifting;House hold activities   Currently in Pain? No/denies            [facility] PT Assessment - 01/02/15 0001    Assessment   Medical Diagnosis S/P Rotator Cuff Surgery.   Onset Date/Surgical Date 10/02/14   Next MD Visit 01/18/2015                     [facility] Adult PT Treatment/Exercise - 01/02/15 0001    Shoulder Exercises: Seated   Other Seated Exercises L Bicep curl 2# x30 reps   Shoulder Exercises: Prone   Horizontal ABduction 1 AROM  x30 reps   Shoulder Exercises: Sidelying   External Rotation Strengthening;Left;Weights  x30 reps   External Rotation Weight (lbs)  1   Shoulder Exercises: Standing   Protraction Strengthening;Left;Theraband;Other (comment)  x30 reps   Theraband Level (Shoulder Protraction) Level 1 (Yellow)   External Rotation Strengthening;Left;Theraband;Other (comment)  x30 reps   Theraband Level (Shoulder External Rotation) Level 1 (Yellow)   Internal Rotation Strengthening;Left;Theraband;Other (comment)  x30 reps   Theraband Level (Shoulder Internal Rotation) Level 1 (Yellow)   Extension Strengthening;Left;Weights  x30 reps   Extension Weight (lbs) 1   Row Strengthening;Left;Theraband;Other (comment);20 reps  x30 reps   Theraband Level (Shoulder Row) Level 1 (Yellow)   Row Weight (lbs) 1   Other Standing Exercises AROM for flexion to decrease compensatory motions 3x10 then 90 degrees and up 3x10   Other Standing Exercises Bicep curl 2# 3x 10   Shoulder Exercises: Pulleys   Flexion 3 minutes   Other Pulley Exercises UE ranger for elevation/circles 3x10 each   Other Pulley  Exercises Wall ladder x78min   Shoulder Exercises: ROM/Strengthening   Wall Wash x2 min   Wall Pushups Other (comment)  x30 reps   Other ROM/Strengthening Exercises Supine ABCs x1 rep   Other ROM/Strengthening Exercises Small circles x41min                  PT Short Term Goals - 01/02/15 1114    PT SHORT TERM GOAL #1   Title STG's=LTG's.   Status On-going           PT Long Term Goals - 01/02/15 1117    PT LONG TERM GOAL #1   Title Independent with an advanced HEP.   Time 4   Period Weeks   Status Achieved   PT LONG TERM GOAL #2   Title Active left shoulder flexion to 150 degrees so the patient can easily reach overhead   Time 4   Period Weeks   Status On-going  155 degrees in supine   PT LONG TERM GOAL #3   Title Active ER to 70 degrees+ to allow for easily donning/doffing of apparel   Time 4   Period Weeks   Status On-going  68 degrees in supine   PT LONG TERM GOAL #4   Title Increase ROM so patient is able to reach behind back to L3 with left hand   Time 4   Period Weeks   Status On-going   PT LONG TERM GOAL #5   Title Increase left shoulder strength to a solid 4+/5 to increase stability for performance of functional activities   Time 4   Period Weeks   Status On-going   PT LONG TERM GOAL #6   Title Perform ADL's with pain not > 2-3/10.   Time 4   Period Weeks   Status Achieved               Plan - 01/02/15 1114    Clinical Impression Statement Patient continues to tolerate treatment well with strengthening and stabilization. Continues to demonstrate minimal to moderate shoulder elevation compensation during AROM standing flexion. Demonstrates good L shoulder stability during stabilization exercises. Requires minimal verbal cueing to be more aware of shouler elevation during exericse and to decrease the compensation. Experienced only fatigue following treatment no pain reported.   Pt will benefit from skilled therapeutic intervention in order  to improve on the following deficits Decreased strength;Pain;Impaired UE functional use;Decreased range of motion   Rehab Potential Excellent   Clinical Impairments Affecting Rehab Potential L shoulder surgery date 10/02/2014 (will be 12weeks post-op 12/25/2014)   PT Frequency 3x / week  PT Duration 4 weeks   PT Treatment/Interventions Electrical Stimulation;Moist Heat;Passive range of motion   PT Next Visit Plan Continue per PT POC and MD POC.   PT Home Exercise Plan [email or web address] with yellow theraband, supine flexion   Consulted and Agree with Plan of Care Patient        Problem List Patient Active Problem List   Diagnosis Date Noted  . Acute bronchitis 09/08/2014  . Mitral insufficiency 05/12/2014  . HTN (hypertension) 04/28/2014  . Lactic acidosis 09/07/2013  . Mitral valve prolapse 06/14/2013  . Chest pain, exertional 09/13/2012  . Pernicious anemia 09/07/2012  . DIVERTICULOSIS, COLON 09/06/2009  . Osteoporosis 09/06/2009  . HYPERLIPIDEMIA 09/04/2009  . PERIPHERAL NEUROPATHY 09/04/2009  . LOW BACK PAIN, CHRONIC 04/08/2007    Wynelle Fanny, PTA 01/02/2015, 11:18 AM  [facility] [address] [place], [place], [address] Phone: [phone]   Fax:  [phone]

## 2015-01-04 ENCOUNTER — Encounter: Payer: Self-pay | Admitting: Physical Therapy

## 2015-01-04 ENCOUNTER — Ambulatory Visit: Payer: Medicare Other | Attending: Orthopedic Surgery | Admitting: Physical Therapy

## 2015-01-04 DIAGNOSIS — M25612 Stiffness of left shoulder, not elsewhere classified: Secondary | ICD-10-CM

## 2015-01-04 DIAGNOSIS — M25512 Pain in left shoulder: Secondary | ICD-10-CM

## 2015-01-04 NOTE — Therapy (Signed)
[facility] [place], [place], [address] Phone: [phone]   Fax:  [phone]  Physical Therapy Treatment  Patient Details  Name: Cassie White MRN: [id number] Date of Birth: May 06, 1941 Referring Provider:  Tammi Sou, MD  Encounter Date: 01/04/2015      PT End of Session - 01/04/15 1036    Visit Number 26   Number of Visits 32   Date for PT Re-Evaluation 01/23/15   PT Start Time 3953   PT Stop Time 1117   PT Time Calculation (min) 42 min   Activity Tolerance Patient tolerated treatment well   Behavior During Therapy [facility] for tasks assessed/performed      Past Medical History  Diagnosis Date  . Arthritis     DJD, back and both hips  . Hyperlipidemia   . GERD (gastroesophageal reflux disease)   . Osteoporosis, senile     stable at the left hip 2014-2015.  Took fosamax for 10 yrs.  Plan: get repeat DEXA q40mo.  . Diverticulosis     on colonoscopy 2004  . Scoliosis   . Peripheral neuropathy 2008    Idiopathic vs familial (motor>sensory): gabapentin helpful  . Hx of cardiovascular stress test     Lexiscan Myoview (2/14):  Low risk, small ant defect likely breast attenuation, no ischemia, EF 69% (no change from 2010).    . Mitral regurgitation     Echo (2/14):  EF 60-65%, Gr 1 DD, mild AI, mild bileaflet MVP, mod post directed MR, mild LAE, PASP 35.  Marland Kitchen MVP (mitral valve prolapse)   . Shoulder pain left    to have surgery 10/02/14 for rotator cuff repair    Past Surgical History  Procedure Laterality Date  . Tonsillectomy    . Vaginal hysterectomy      For Uterine Deviation   . 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 Cholecystectomy  03/2010    Dr.Gross  . Lumpectomy  06/2009    "Fatty Necrosis"  . Breast surgery  Nov 2010    Benign biopsy, right  . Cystocele repair    . Combined hysterectomy vaginal / oophorectomy / a&p repair  2023    uncertain if ovaries were removed or not  .  Colonoscopy  10/03/2002    No polyps.  Repeat 10 yrs (Dr. Olevia Perches)  . Pft's  2014    NORMAL  . Rotator cuff repair Left 10/02/14    Dr. Veverly Fells    There were no vitals filed for this visit.  Visit Diagnosis:  Left shoulder pain  Shoulder stiffness, left      Subjective Assessment - 01/04/15 1036    Subjective States she always has a little soreness prior to therapy but is loosened up by the end of the treatment.   Limitations Lifting;House hold activities   Currently in Pain? No/denies            [facility] PT Assessment - 01/04/15 0001    Assessment   Medical Diagnosis S/P Rotator Cuff Surgery.   Onset Date/Surgical Date 10/02/14   Next MD Visit 01/18/2015   AROM   Overall AROM Comments scaption AROM 122 deg   AROM Assessment Site Shoulder   Right/Left Shoulder Left   Left Shoulder Flexion 146 Degrees   Left Shoulder Internal Rotation 30 Degrees   Left Shoulder External Rotation 82 Degrees  [facility] Adult PT Treatment/Exercise - 01/04/15 0001    Shoulder Exercises: Supine   Other Supine Exercises Supine small circles x76min, LUE ABCs x2 reps   Shoulder Exercises: Seated   Other Seated Exercises L Bicep curl 2# x30 reps   Shoulder Exercises: Prone   Horizontal ABduction 1 AROM;Left  x30 reps; bent   Shoulder Exercises: Standing   Protraction Strengthening;Left;Theraband;Other (comment)  x30 reps   Theraband Level (Shoulder Protraction) Level 1 (Yellow)   External Rotation Strengthening;Left;Theraband;Other (comment)  x30 reps   Theraband Level (Shoulder External Rotation) Level 1 (Yellow)   Internal Rotation Strengthening;Left;Theraband;Other (comment)  x30 reps   Theraband Level (Shoulder Internal Rotation) Level 1 (Yellow)   Extension Strengthening;Left;Weights  x30 reps; 10 reps 1#, 20 reps 2#   Extension Weight (lbs) 2   Row Strengthening;Left;Theraband;Other (comment);20 reps  x30 reps   Row Weight (lbs) 2   Other Standing  Exercises AAROM for flexion with pressure over L shoulder to decrease compensation   Shoulder Exercises: Pulleys   Flexion 3 minutes   Other Pulley Exercises UE ranger for elevation/circles 3x10 each   Other Pulley Exercises Wall ladder x64min   Shoulder Exercises: ROM/Strengthening   Wall Wash x2 min   Wall Pushups Other (comment)  x30 reps   Shoulder Exercises: Stretch   Cross Chest Stretch 3 reps;30 seconds;Other (comment)  LUE stretch                  PT Short Term Goals - 01/02/15 1114    PT SHORT TERM GOAL #1   Title STG's=LTG's.   Status On-going           PT Long Term Goals - 01/02/15 1117    PT LONG TERM GOAL #1   Title Independent with an advanced HEP.   Time 4   Period Weeks   Status Achieved   PT LONG TERM GOAL #2   Title Active left shoulder flexion to 150 degrees so the patient can easily reach overhead   Time 4   Period Weeks   Status On-going  155 degrees in supine   PT LONG TERM GOAL #3   Title Active ER to 70 degrees+ to allow for easily donning/doffing of apparel   Time 4   Period Weeks   Status On-going  68 degrees in supine   PT LONG TERM GOAL #4   Title Increase ROM so patient is able to reach behind back to L3 with left hand   Time 4   Period Weeks   Status On-going   PT LONG TERM GOAL #5   Title Increase left shoulder strength to a solid 4+/5 to increase stability for performance of functional activities   Time 4   Period Weeks   Status On-going   PT LONG TERM GOAL #6   Title Perform ADL's with pain not > 2-3/10.   Time 4   Period Weeks   Status Achieved               Plan - 01/04/15 1119    Clinical Impression Statement Patient continues to tolerate treatment well with strengthening and stabilization. Continues to demonstrate compensation during elevation of L shoulder. Required tactile cueing to avoid L shoulder compensation and minimal verbal cueing and demosntration for correct technique of some exercises. All  AROM measurements were taken today in standing. Improved L shoulder stability noted during stabilization exercises. Experienced L shoulder fatigue only following treamtent.   Pt will benefit from skilled therapeutic intervention in  order to improve on the following deficits Decreased strength;Pain;Impaired UE functional use;Decreased range of motion   Rehab Potential Excellent   Clinical Impairments Affecting Rehab Potential L shoulder surgery date 10/02/2014 (will be 12weeks post-op 12/25/2014)   PT Frequency 3x / week   PT Duration 4 weeks   PT Treatment/Interventions Electrical Stimulation;Moist Heat;Passive range of motion   PT Next Visit Plan Continue per PT POC and MD POC.   PT Home Exercise Plan RW4 with yellow theraband, supine flexion   Consulted and Agree with Plan of Care Patient        Problem List Patient Active Problem List   Diagnosis Date Noted  . Acute bronchitis 09/08/2014  . Mitral insufficiency 05/12/2014  . HTN (hypertension) 04/28/2014  . Lactic acidosis 09/07/2013  . Mitral valve prolapse 06/14/2013  . Chest pain, exertional 09/13/2012  . Pernicious anemia 09/07/2012  . DIVERTICULOSIS, COLON 09/06/2009  . Osteoporosis 09/06/2009  . HYPERLIPIDEMIA 09/04/2009  . PERIPHERAL NEUROPATHY 09/04/2009  . LOW BACK PAIN, CHRONIC 04/08/2007    Ahmed Prima, PTA 01/04/2015 11:22 AM   [facility] [place], [place], [address] Phone: [phone]   Fax:  [phone]

## 2015-01-09 ENCOUNTER — Encounter: Payer: Self-pay | Admitting: Physical Therapy

## 2015-01-09 ENCOUNTER — Ambulatory Visit: Payer: Medicare Other | Admitting: Physical Therapy

## 2015-01-09 DIAGNOSIS — M25612 Stiffness of left shoulder, not elsewhere classified: Secondary | ICD-10-CM

## 2015-01-09 DIAGNOSIS — M25512 Pain in left shoulder: Secondary | ICD-10-CM

## 2015-01-09 NOTE — Therapy (Signed)
[facility] [place], [place], [address] Phone: [phone]   Fax:  [phone]  Physical Therapy Treatment  Patient Details  Name: Cassie White MRN: [id number] Date of Birth: Sep 20, 1940 Referring Provider:  Tammi Sou, MD  Encounter Date: 01/09/2015      PT End of Session - 01/09/15 1040    Visit Number 27   Number of Visits 32   Date for PT Re-Evaluation 01/23/15   PT Start Time 1032   PT Stop Time 1112   PT Time Calculation (min) 40 min   Activity Tolerance Patient tolerated treatment well   Behavior During Therapy [facility] for tasks assessed/performed      Past Medical History  Diagnosis Date  . Arthritis     DJD, back and both hips  . Hyperlipidemia   . GERD (gastroesophageal reflux disease)   . Osteoporosis, senile     stable at the left hip 2014-2015.  Took fosamax for 10 yrs.  Plan: get repeat DEXA q30mo.  . Diverticulosis     on colonoscopy 2004  . Scoliosis   . Peripheral neuropathy 2008    Idiopathic vs familial (motor>sensory): gabapentin helpful  . Hx of cardiovascular stress test     Lexiscan Myoview (2/14):  Low risk, small ant defect likely breast attenuation, no ischemia, EF 69% (no change from 2010).    . Mitral regurgitation     Echo (2/14):  EF 60-65%, Gr 1 DD, mild AI, mild bileaflet MVP, mod post directed MR, mild LAE, PASP 35.  Marland Kitchen MVP (mitral valve prolapse)   . Shoulder pain left    to have surgery 10/02/14 for rotator cuff repair    Past Surgical History  Procedure Laterality Date  . Tonsillectomy    . Vaginal hysterectomy      For Uterine Deviation   . 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 Cholecystectomy  03/2010    Dr.Gross  . Lumpectomy  06/2009    "Fatty Necrosis"  . Breast surgery  Nov 2010    Benign biopsy, right  . Cystocele repair    . Combined hysterectomy vaginal / oophorectomy / a&p repair  1324    uncertain if ovaries were removed or not  .  Colonoscopy  10/03/2002    No polyps.  Repeat 10 yrs (Dr. Olevia Perches)  . Pft's  2014    NORMAL  . Rotator cuff repair Left 10/02/14    Dr. Veverly Fells    There were no vitals filed for this visit.  Visit Diagnosis:  Left shoulder pain  Shoulder stiffness, left      Subjective Assessment - 01/09/15 1032    Subjective Reports no issues with pain today in L shoulder only some soreness in L bicep muscle belly at times. Continues to do chores around her home that are normal for her.   Limitations Lifting;House hold activities   Currently in Pain? No/denies            [facility] PT Assessment - 01/09/15 0001    Assessment   Medical Diagnosis S/P Rotator Cuff Surgery.   Onset Date/Surgical Date 10/02/14   Next MD Visit 01/18/2015                     [facility] Adult PT Treatment/Exercise - 01/09/15 0001    Shoulder Exercises: Supine   Flexion Left;Weights;Strengthening  x30 reps; wedge for lawnchair progression   Shoulder Flexion Weight (lbs) 1  Other Supine Exercises Supine small circles x76min, LUE ABCs x2 reps   Shoulder Exercises: Standing   Protraction Strengthening;Left;Theraband;Other (comment)  x30 reps   Theraband Level (Shoulder Protraction) Level 1 (Yellow)   Horizontal ABduction Strengthening;Left;Weights;Other (comment)  x30 reps   Horizontal ABduction Weight (lbs) 1   External Rotation Strengthening;Left;Theraband;Other (comment)  x30 reps   Theraband Level (Shoulder External Rotation) Level 1 (Yellow)   Internal Rotation Strengthening;Left;Theraband;Other (comment)  x30 reps   Theraband Level (Shoulder Internal Rotation) Level 1 (Yellow)   Extension Strengthening;Left;Weights  x30 reps   Extension Weight (lbs) 3   Row Strengthening;Left;Theraband;Other (comment);20 reps  x30 reps   Row Weight (lbs) 3   Other Standing Exercises L Bicep curl 2# 3x 10   Shoulder Exercises: Pulleys   Flexion 3 minutes   Other Pulley Exercises UE ranger for elevation/circles 3x10  each   Other Pulley Exercises Wall ladder x41min   Shoulder Exercises: ROM/Strengthening   Wall Pushups Other (comment)  x30 reps   Other ROM/Strengthening Exercises Ball rolls on wall updown, L/R, CW, CCW x30 reps each                  PT Short Term Goals - 01/02/15 1114    PT SHORT TERM GOAL #1   Title STG's=LTG's.   Status On-going           PT Long Term Goals - 01/02/15 1117    PT LONG TERM GOAL #1   Title Independent with an advanced HEP.   Time 4   Period Weeks   Status Achieved   PT LONG TERM GOAL #2   Title Active left shoulder flexion to 150 degrees so the patient can easily reach overhead   Time 4   Period Weeks   Status On-going  155 degrees in supine   PT LONG TERM GOAL #3   Title Active ER to 70 degrees+ to allow for easily donning/doffing of apparel   Time 4   Period Weeks   Status On-going  68 degrees in supine   PT LONG TERM GOAL #4   Title Increase ROM so patient is able to reach behind back to L3 with left hand   Time 4   Period Weeks   Status On-going   PT LONG TERM GOAL #5   Title Increase left shoulder strength to a solid 4+/5 to increase stability for performance of functional activities   Time 4   Period Weeks   Status On-going   PT LONG TERM GOAL #6   Title Perform ADL's with pain not > 2-3/10.   Time 4   Period Weeks   Status Achieved               Plan - 01/09/15 1117    Clinical Impression Statement Patient continues to tolerate increasing resistance and new exercises well with only reports of fatigue following. Flexion completed against wedge in order to strengthen while minimizing L shoulder compensatory strategies. Demonstrates improved L shoulder stability during stabilization exercises. Continues to require minimal verbal cueing and demonstration to correct form and technique.    Pt will benefit from skilled therapeutic intervention in order to improve on the following deficits Decreased strength;Pain;Impaired UE  functional use;Decreased range of motion   Rehab Potential Excellent   Clinical Impairments Affecting Rehab Potential L shoulder surgery date 10/02/2014 (will be 12weeks post-op 12/25/2014)   PT Frequency 3x / week   PT Duration 4 weeks   PT Treatment/Interventions Therapeutic exercise  PT Next Visit Plan Continue per PT POC and MD POC.   PT Home Exercise Plan RW4 with yellow theraband, supine flexion   Consulted and Agree with Plan of Care Patient        Problem List Patient Active Problem List   Diagnosis Date Noted  . Acute bronchitis 09/08/2014  . Mitral insufficiency 05/12/2014  . HTN (hypertension) 04/28/2014  . Lactic acidosis 09/07/2013  . Mitral valve prolapse 06/14/2013  . Chest pain, exertional 09/13/2012  . Pernicious anemia 09/07/2012  . DIVERTICULOSIS, COLON 09/06/2009  . Osteoporosis 09/06/2009  . HYPERLIPIDEMIA 09/04/2009  . PERIPHERAL NEUROPATHY 09/04/2009  . LOW BACK PAIN, CHRONIC 04/08/2007    Wynelle Fanny, PTA 01/09/2015, 11:23 AM  [facility] [address] [place], [place], [address] Phone: [phone]   Fax:  [phone]

## 2015-01-11 ENCOUNTER — Encounter: Payer: Self-pay | Admitting: Physical Therapy

## 2015-01-11 ENCOUNTER — Ambulatory Visit: Payer: Medicare Other | Admitting: Physical Therapy

## 2015-01-11 DIAGNOSIS — M25612 Stiffness of left shoulder, not elsewhere classified: Secondary | ICD-10-CM

## 2015-01-11 DIAGNOSIS — M25512 Pain in left shoulder: Secondary | ICD-10-CM | POA: Diagnosis not present

## 2015-01-11 NOTE — Therapy (Signed)
[facility] [place], [place], [address] Phone: [phone]   Fax:  [phone]  Physical Therapy Treatment  Patient Details  Name: Cassie White MRN: [id number] Date of Birth: 10-Mar-1941 Referring Provider:  Tammi Sou, MD  Encounter Date: 01/11/2015      PT End of Session - 01/11/15 1034    Visit Number 28   Number of Visits 32   Date for PT Re-Evaluation 01/23/15   PT Start Time 1032   PT Stop Time 1110   PT Time Calculation (min) 38 min   Activity Tolerance Patient tolerated treatment well   Behavior During Therapy [facility] for tasks assessed/performed      Past Medical History  Diagnosis Date  . Arthritis     DJD, back and both hips  . Hyperlipidemia   . GERD (gastroesophageal reflux disease)   . Osteoporosis, senile     stable at the left hip 2014-2015.  Took fosamax for 10 yrs.  Plan: get repeat DEXA q43mo.  . Diverticulosis     on colonoscopy 2004  . Scoliosis   . Peripheral neuropathy 2008    Idiopathic vs familial (motor>sensory): gabapentin helpful  . Hx of cardiovascular stress test     Lexiscan Myoview (2/14):  Low risk, small ant defect likely breast attenuation, no ischemia, EF 69% (no change from 2010).    . Mitral regurgitation     Echo (2/14):  EF 60-65%, Gr 1 DD, mild AI, mild bileaflet MVP, mod post directed MR, mild LAE, PASP 35.  Marland Kitchen MVP (mitral valve prolapse)   . Shoulder pain left    to have surgery 10/02/14 for rotator cuff repair    Past Surgical History  Procedure Laterality Date  . Tonsillectomy    . Vaginal hysterectomy      For Uterine Deviation   . 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 Cholecystectomy  03/2010    Dr.Gross  . Lumpectomy  06/2009    "Fatty Necrosis"  . Breast surgery  Nov 2010    Benign biopsy, right  . Cystocele repair    . Combined hysterectomy vaginal / oophorectomy / a&p repair  8938    uncertain if ovaries were removed or not  .  Colonoscopy  10/03/2002    No polyps.  Repeat 10 yrs (Dr. Olevia Perches)  . Pft's  2014    NORMAL  . Rotator cuff repair Left 10/02/14    Dr. Veverly Fells    There were no vitals filed for this visit.  Visit Diagnosis:  Left shoulder pain  Shoulder stiffness, left      Subjective Assessment - 01/11/15 1034    Subjective Reports no pain or issues today in L shoulder.   Limitations Lifting;House hold activities   Currently in Pain? No/denies            [facility] PT Assessment - 01/11/15 0001    Assessment   Medical Diagnosis S/P Rotator Cuff Surgery.   Onset Date/Surgical Date 10/02/14   Next MD Visit 01/18/2015                     [facility] Adult PT Treatment/Exercise - 01/11/15 0001    Shoulder Exercises: Seated   Flexion AROM;Both  x30 reps   Other Seated Exercises LUE ABCs x2 reps   Shoulder Exercises: Sidelying   External Rotation Strengthening;Left;Weights  x30 reps   External Rotation Weight (lbs) 1   Shoulder Exercises: Standing  Protraction Strengthening;Left;Theraband;Other (comment)  x30 reps   Theraband Level (Shoulder Protraction) Level 2 (Red)   External Rotation Strengthening;Left;Theraband;Other (comment)  x30 reps   Theraband Level (Shoulder External Rotation) Level 2 (Red)   Internal Rotation Strengthening;Left;Theraband;Other (comment)  x30 reps   Theraband Level (Shoulder Internal Rotation) Level 2 (Red)   Extension Strengthening;Left;Weights  x30 reps   Extension Weight (lbs) 3   Row Strengthening;Left;Theraband;Other (comment);20 reps  x30 reps   Row Weight (lbs) 3   Shoulder Exercises: Pulleys   Flexion 3 minutes   Other Pulley Exercises UE ranger for elevation/circles 3x10 each   Other Pulley Exercises Wall ladder x40min   Shoulder Exercises: ROM/Strengthening   Wall Pushups Other (comment)  x30 reps   Other ROM/Strengthening Exercises Ball rolls CW/ CCW x3                  PT Short Term Goals - 01/02/15 1114    PT SHORT TERM  GOAL #1   Title STG's=LTG's.   Status On-going           PT Long Term Goals - 01/02/15 1117    PT LONG TERM GOAL #1   Title Independent with an advanced HEP.   Time 4   Period Weeks   Status Achieved   PT LONG TERM GOAL #2   Title Active left shoulder flexion to 150 degrees so the patient can easily reach overhead   Time 4   Period Weeks   Status On-going  155 degrees in supine   PT LONG TERM GOAL #3   Title Active ER to 70 degrees+ to allow for easily donning/doffing of apparel   Time 4   Period Weeks   Status On-going  68 degrees in supine   PT LONG TERM GOAL #4   Title Increase ROM so patient is able to reach behind back to L3 with left hand   Time 4   Period Weeks   Status On-going   PT LONG TERM GOAL #5   Title Increase left shoulder strength to a solid 4+/5 to increase stability for performance of functional activities   Time 4   Period Weeks   Status On-going   PT LONG TERM GOAL #6   Title Perform ADL's with pain not > 2-3/10.   Time 4   Period Weeks   Status Achieved               Plan - 01/11/15 1110    Clinical Impression Statement Patient continues to tolerate and progress in physical therapy. Only experienced LUE fatigue following treatment today. AROM was completed in sitting against gravity with minimal compensatory strategy observed. Good stability of R shoulder demonstrated but should continue improving with treatments.   Pt will benefit from skilled therapeutic intervention in order to improve on the following deficits Decreased strength;Pain;Impaired UE functional use;Decreased range of motion   Rehab Potential Excellent   Clinical Impairments Affecting Rehab Potential L shoulder surgery date 10/02/2014 (will be 12weeks post-op 12/25/2014)   PT Frequency 3x / week   PT Duration 4 weeks   PT Treatment/Interventions Therapeutic exercise   PT Next Visit Plan Continue per PT POC and MD POC.   PT Home Exercise Plan RW4 with yellow theraband,  supine flexion   Consulted and Agree with Plan of Care Patient        Problem List Patient Active Problem List   Diagnosis Date Noted  . Acute bronchitis 09/08/2014  . Mitral insufficiency 05/12/2014  .  HTN (hypertension) 04/28/2014  . Lactic acidosis 09/07/2013  . Mitral valve prolapse 06/14/2013  . Chest pain, exertional 09/13/2012  . Pernicious anemia 09/07/2012  . DIVERTICULOSIS, COLON 09/06/2009  . Osteoporosis 09/06/2009  . HYPERLIPIDEMIA 09/04/2009  . PERIPHERAL NEUROPATHY 09/04/2009  . LOW BACK PAIN, CHRONIC 04/08/2007    Wynelle Fanny, PTA 01/11/2015, 11:20 AM  [facility] [address] [place], [place], [address] Phone: [phone]   Fax:  [phone]

## 2015-01-15 ENCOUNTER — Encounter: Payer: Self-pay | Admitting: Physical Therapy

## 2015-01-15 ENCOUNTER — Ambulatory Visit: Payer: Medicare Other | Admitting: Physical Therapy

## 2015-01-15 DIAGNOSIS — M25512 Pain in left shoulder: Secondary | ICD-10-CM | POA: Diagnosis not present

## 2015-01-15 DIAGNOSIS — M25612 Stiffness of left shoulder, not elsewhere classified: Secondary | ICD-10-CM | POA: Diagnosis not present

## 2015-01-15 NOTE — Therapy (Signed)
[facility] [place], [place], [address] Phone: [phone]   Fax:  [phone]  Physical Therapy Treatment  Patient Details  Name: Cassie White MRN: [id number] Date of Birth: 02-21-1941 Referring Provider:  Tammi Sou, MD  Encounter Date: 01/15/2015      PT End of Session - 01/15/15 1100    Visit Number 29   Number of Visits 32   Date for PT Re-Evaluation 01/23/15   PT Start Time 1034   PT Stop Time 1114   PT Time Calculation (min) 40 min   Activity Tolerance Patient tolerated treatment well   Behavior During Therapy [facility] for tasks assessed/performed      Past Medical History  Diagnosis Date  . Arthritis     DJD, back and both hips  . Hyperlipidemia   . GERD (gastroesophageal reflux disease)   . Osteoporosis, senile     stable at the left hip 2014-2015.  Took fosamax for 10 yrs.  Plan: get repeat DEXA q93mo  . Diverticulosis     on colonoscopy 2004  . Scoliosis   . Peripheral neuropathy 2008    Idiopathic vs familial (motor>sensory): gabapentin helpful  . Hx of cardiovascular stress test     Lexiscan Myoview (2/14):  Low risk, small ant defect likely breast attenuation, no ischemia, EF 69% (no change from 2010).    . Mitral regurgitation     Echo (2/14):  EF 60-65%, Gr 1 DD, mild AI, mild bileaflet MVP, mod post directed MR, mild LAE, PASP 35.  .Marland KitchenMVP (mitral valve prolapse)   . Shoulder pain left    to have surgery 10/02/14 for rotator cuff repair    Past Surgical History  Procedure Laterality Date  . Tonsillectomy    . Vaginal hysterectomy      For Uterine Deviation   . Total knee arthroplasty  09/2009    Right ;Dr OAlvan Dame . Cholecystectomy  03/2010    Dr.Gross  . Lumpectomy  06/2009    "Fatty Necrosis"  . Breast surgery  Nov 2010    Benign biopsy, right  . Cystocele repair    . Combined hysterectomy vaginal / oophorectomy / a&p repair  14888   uncertain if ovaries were removed or not  .  Colonoscopy  10/03/2002    No polyps.  Repeat 10 yrs (Dr. BOlevia Perches  . Pft's  2014    NORMAL  . Rotator cuff repair Left 10/02/14    Dr. NVeverly Fells   There were no vitals filed for this visit.  Visit Diagnosis:  Left shoulder pain  Shoulder stiffness, left      Subjective Assessment - 01/15/15 1036    Subjective sore after last treatment   Limitations Lifting;House hold activities   Currently in Pain? Yes   Pain Score 2    Pain Location Shoulder   Pain Orientation Left   Pain Descriptors / Indicators Sore   Pain Type Surgical pain   Pain Onset More than a month ago   Pain Frequency Intermittent   Aggravating Factors  certain movements   Pain Relieving Factors rest            [facility] PT Assessment - 01/15/15 0001    AROM   Overall AROM  Deficits   AROM Assessment Site Shoulder   Right/Left Shoulder Left   Left Shoulder Flexion 145 Degrees   Left Shoulder External Rotation 70 Degrees  [facility] Adult PT Treatment/Exercise - 01/15/15 0001    Shoulder Exercises: Prone   Other Prone Exercises kneeling for 2# ext/rows/horz abd 3x10 each   Shoulder Exercises: Sidelying   External Rotation Strengthening;Left;Weights  1# 3x10   Shoulder Exercises: Standing   Other Standing Exercises RW4 with yellow t-band 2-3x10 each   Other Standing Exercises wall slide with eccentric lowering 2x10   Shoulder Exercises: Pulleys   Flexion --  5mn   Other Pulley Exercises UE ranger for elevation/circles 3x10 each                  PT Short Term Goals - 01/02/15 1114    PT SHORT TERM GOAL #1   Title STG's=LTG's.   Status On-going           PT Long Term Goals - 01/15/15 1046    PT LONG TERM GOAL #1   Title Independent with an advanced HEP.   Time 4   Period Weeks   Status Achieved   PT LONG TERM GOAL #2   Title Active left shoulder flexion to 150 degrees so the patient can easily reach overhead   Time 4   Period Weeks   Status On-going   145 degrees   PT LONG TERM GOAL #3   Title Active ER to 70 degrees+ to allow for easily donning/doffing of apparel   Time 4   Period Weeks   Status Achieved  70 degrees   PT LONG TERM GOAL #4   Title Increase ROM so patient is able to reach behind back to L3 with left hand   Time 4   Period Weeks   Status On-going   PT LONG TERM GOAL #5   Title Increase left shoulder strength to a solid 4+/5 to increase stability for performance of functional activities   Time 4   Period Weeks   Status On-going   PT LONG TERM GOAL #6   Title Perform ADL's with pain not > 2-3/10.   Time 4   Period Weeks   Status Achieved               Plan - 01/15/15 1101    Clinical Impression Statement Patient continues to progress with all activities. Has less pain overall and soreness with increased activity. Improved AROM for left shoulder flexion and met LTG # 3 other LTG's ongoing due to ROM and strength deficit.   Pt will benefit from skilled therapeutic intervention in order to improve on the following deficits Decreased strength;Pain;Impaired UE functional use;Decreased range of motion   Rehab Potential Excellent   Clinical Impairments Affecting Rehab Potential L shoulder surgery date 10/02/2014 (will be 15weeks post-op 01/15/2015)   PT Frequency 3x / week   PT Duration 4 weeks   PT Treatment/Interventions Therapeutic exercise   PT Next Visit Plan Continue per PT POC (MD NVeverly Fells6/16/16)   Consulted and Agree with Plan of Care Patient        Problem List Patient Active Problem List   Diagnosis Date Noted  . Acute bronchitis 09/08/2014  . Mitral insufficiency 05/12/2014  . HTN (hypertension) 04/28/2014  . Lactic acidosis 09/07/2013  . Mitral valve prolapse 06/14/2013  . Chest pain, exertional 09/13/2012  . Pernicious anemia 09/07/2012  . DIVERTICULOSIS, COLON 09/06/2009  . Osteoporosis 09/06/2009  . HYPERLIPIDEMIA 09/04/2009  . PERIPHERAL NEUROPATHY 09/04/2009  . LOW BACK PAIN,  CHRONIC 04/08/2007    Andrena Margerum P, PTA 01/15/2015, 11:14 AM  [facility] [address]  [place], [place], [address] Phone: [phone]   Fax:  [phone]

## 2015-01-17 ENCOUNTER — Ambulatory Visit: Payer: Medicare Other | Admitting: Physical Therapy

## 2015-01-17 ENCOUNTER — Encounter: Payer: Self-pay | Admitting: Physical Therapy

## 2015-01-17 DIAGNOSIS — M25512 Pain in left shoulder: Secondary | ICD-10-CM | POA: Diagnosis not present

## 2015-01-17 DIAGNOSIS — M25612 Stiffness of left shoulder, not elsewhere classified: Secondary | ICD-10-CM

## 2015-01-17 NOTE — Therapy (Signed)
[facility] [place], [place], [address] Phone: [phone]   Fax:  [phone]  Physical Therapy Treatment  Patient Details  Name: Cassie White MRN: [id number] Date of Birth: [birth year] Referring Provider:  Tammi Sou, MD  Encounter Date: 01/17/2015      PT End of Session - 01/17/15 1038    Visit Number 30   Number of Visits 32   Date for PT Re-Evaluation 01/23/15   PT Start Time 1025   PT Stop Time 1107   PT Time Calculation (min) 42 min   Activity Tolerance Patient tolerated treatment well   Behavior During Therapy [facility] for tasks assessed/performed      Past Medical History  Diagnosis Date  . Arthritis     DJD, back and both hips  . Hyperlipidemia   . GERD (gastroesophageal reflux disease)   . Osteoporosis, senile     stable at the left hip 2014-2015.  Took fosamax for 10 yrs.  Plan: get repeat DEXA q49mo  . Diverticulosis     on colonoscopy 2004  . Scoliosis   . Peripheral neuropathy 2008    Idiopathic vs familial (motor>sensory): gabapentin helpful  . Hx of cardiovascular stress test     Lexiscan Myoview (2/14):  Low risk, small ant defect likely breast attenuation, no ischemia, EF 69% (no change from 2010).    . Mitral regurgitation     Echo (2/14):  EF 60-65%, Gr 1 DD, mild AI, mild bileaflet MVP, mod post directed MR, mild LAE, PASP 35.  .Marland KitchenMVP (mitral valve prolapse)   . Shoulder pain left    to have surgery 10/02/14 for rotator cuff repair    Past Surgical History  Procedure Laterality Date  . Tonsillectomy    . Vaginal hysterectomy      For Uterine Deviation   . Total knee arthroplasty  09/2009    Right ;Dr OAlvan Dame . Cholecystectomy  03/2010    Dr.Gross  . Lumpectomy  06/2009    "Fatty Necrosis"  . Breast surgery  Nov 2010    Benign biopsy, right  . Cystocele repair    . Combined hysterectomy vaginal / oophorectomy / a&p repair  14431   uncertain if ovaries were removed or not  .  Colonoscopy  10/03/2002    No polyps.  Repeat 10 yrs (Dr. BOlevia Perches  . Pft's  2014    NORMAL  . Rotator cuff repair Left 10/02/14    Dr. NVeverly Fells   There were no vitals filed for this visit.  Visit Diagnosis:  Left shoulder pain  Shoulder stiffness, left      Subjective Assessment - 01/17/15 1027    Subjective Patient has no pain only soreness in left shoulder today   Limitations Lifting;House hold activities   Currently in Pain? No/denies            [facility] - 01[facility] 0001    ROM / Strength   AROM / PROM / Strength AROM;PROM   AROM   Overall AROM  Deficits   AROM Assessment Site Shoulder   Right/Left Shoulder Left   Left Shoulder Flexion 145 Degrees   Left Shoulder External Rotation 70 Degrees   PROM   Overall PROM  Within functional limits for tasks performed   PROM Assessment Site Shoulder   Right/Left Shoulder Left   Left Shoulder Flexion 150 Degrees   Left Shoulder External Rotation 70 Degrees  [facility] Treatment/Exercise - 01/17/15 0001    Shoulder Exercises: Prone   Other Prone Exercises kneeling for 2# ext/rows/horz abd 3x10 each   Shoulder Exercises: Sidelying   External Rotation Strengthening;Left;Weights   External Rotation Weight (lbs) 1   Shoulder Exercises: Standing   Other Standing Exercises RW4 with yellow t-band 2-3x10 each   Other Standing Exercises wall slide with eccentric lowering 2x10   Shoulder Exercises: Pulleys   Flexion --  75mn   Other Pulley Exercises UE ranger for elevation/circles 3x10 each                  PT Short Term Goals - 01/02/15 1114    PT SHORT TERM GOAL #1   Title STG's=LTG's.   Status On-going           PT Long Term Goals - 01/17/15 1054    PT LONG TERM GOAL #1   Title Independent with an advanced HEP.   Time 4   Period Weeks   Status Achieved   PT LONG TERM GOAL #2   Title Active left shoulder flexion to 150 degrees so the patient can easily  reach overhead   Time 4   Period Weeks   Status On-going  AROM 145 degrees antigravity/ AAROM supine 150 degrees   PT LONG TERM GOAL #3   Title Active ER to 70 degrees+ to allow for easily donning/doffing of apparel   Time 4   Period Weeks   Status Achieved  70 degrees   PT LONG TERM GOAL #4   Title Increase ROM so patient is able to reach behind back to L3 with left hand   Time 4   Period Weeks   Status Achieved  T12    PT LONG TERM GOAL #5   Title Increase left shoulder strength to a solid 4+/5 to increase stability for performance of functional activities   Time 4   Period Weeks   Status On-going               Plan - 01/17/15 1058    Clinical Impression Statement Patient continues to progress with all activities. Patient has no pain only some soreness in left shoulder. Has improved ROM for flexion, ER and IR. Patient has 145 degrees flexion antigravity and may be due to strength deficit . AAROM in supine is WNL . Patient is also able to reach behind back with ease. MET LTG #4 today other goals ongoing due to strength deficit.   Pt will benefit from skilled therapeutic intervention in order to improve on the following deficits Decreased strength;Pain;Impaired UE functional use;Decreased range of motion   Rehab Potential Excellent   Clinical Impairments Affecting Rehab Potential L shoulder surgery date 10/02/2014 (will be 15weeks post-op 01/15/2015)/ 30th FOTO 45%limitation (initial 100% limitation)   PT Frequency 3x / week   PT Duration 4 weeks   PT Treatment/Interventions Therapeutic exercise   PT Next Visit Plan Continue per PT POC (MD NVeverly Fells6/16/16)   Consulted and Agree with Plan of Care Patient        Problem List Patient Active Problem List   Diagnosis Date Noted  . Acute bronchitis 09/08/2014  . Mitral insufficiency 05/12/2014  . HTN (hypertension) 04/28/2014  . Lactic acidosis 09/07/2013  . Mitral valve prolapse 06/14/2013  . Chest pain, exertional  09/13/2012  . Pernicious anemia 09/07/2012  . DIVERTICULOSIS, COLON 09/06/2009  . Osteoporosis 09/06/2009  . HYPERLIPIDEMIA 09/04/2009  . PERIPHERAL NEUROPATHY 09/04/2009  . LOW BACK  PAIN, CHRONIC 04/08/2007   Ladean Raya, PTA 01/17/2015 11:08 AM  Edmund Rick P 01/17/2015, 11:08 AM  [facility] [address] [place], [place], [address] Phone: [phone]   Fax:  [phone]

## 2015-01-18 DIAGNOSIS — M75122 Complete rotator cuff tear or rupture of left shoulder, not specified as traumatic: Secondary | ICD-10-CM | POA: Diagnosis not present

## 2015-01-18 DIAGNOSIS — Z4789 Encounter for other orthopedic aftercare: Secondary | ICD-10-CM | POA: Diagnosis not present

## 2015-01-22 ENCOUNTER — Encounter: Payer: BLUE CROSS/BLUE SHIELD | Admitting: Physical Therapy

## 2015-01-22 NOTE — Therapy (Signed)
[facility] [place], [place], [address] Phone: [phone]   Fax:  [phone]  Physical Therapy Treatment  Patient Details  Name: Cassie White MRN: [id number] Date of Birth: 02/09/1941 Referring Provider:  Tammi Sou, MD  Encounter Date: 01/17/2015    Past Medical History  Diagnosis Date  . Arthritis     DJD, back and both hips  . Hyperlipidemia   . GERD (gastroesophageal reflux disease)   . Osteoporosis, senile     stable at the left hip 2014-2015.  Took fosamax for 10 yrs.  Plan: get repeat DEXA q73mo  . Diverticulosis     on colonoscopy 2004  . Scoliosis   . Peripheral neuropathy 2008    Idiopathic vs familial (motor>sensory): gabapentin helpful  . Hx of cardiovascular stress test     Lexiscan Myoview (2/14):  Low risk, small ant defect likely breast attenuation, no ischemia, EF 69% (no change from 2010).    . Mitral regurgitation     Echo (2/14):  EF 60-65%, Gr 1 DD, mild AI, mild bileaflet MVP, mod post directed MR, mild LAE, PASP 35.  .Marland KitchenMVP (mitral valve prolapse)   . Shoulder pain left    to have surgery 10/02/14 for rotator cuff repair    Past Surgical History  Procedure Laterality Date  . Tonsillectomy    . Vaginal hysterectomy      For Uterine Deviation   . Total knee arthroplasty  09/2009    Right ;Dr OAlvan Dame . Cholecystectomy  03/2010    Dr.Gross  . Lumpectomy  06/2009    "Fatty Necrosis"  . Breast surgery  Nov 2010    Benign biopsy, right  . Cystocele repair    . Combined hysterectomy vaginal / oophorectomy / a&p repair  10177   uncertain if ovaries were removed or not  . Colonoscopy  10/03/2002    No polyps.  Repeat 10 yrs (Dr. BOlevia Perches  . Pft's  2014    NORMAL  . Rotator cuff repair Left 10/02/14    Dr. NVeverly Fells   There were no vitals filed for this visit.  Visit Diagnosis:  Left shoulder pain - Plan: PT plan of care cert/re-cert  Shoulder stiffness, left - Plan: PT plan of care  cert/re-cert                                 PT Short Term Goals - 01/02/15 1114    PT SHORT TERM GOAL #1   Title STG's=LTG's.   Status On-going           PT Long Term Goals - 01/17/15 1054    PT LONG TERM GOAL #1   Title Independent with an advanced HEP.   Time 4   Period Weeks   Status Achieved   PT LONG TERM GOAL #2   Title Active left shoulder flexion to 150 degrees so the patient can easily reach overhead   Time 4   Period Weeks   Status On-going  AROM 145 degrees antigravity/ AAROM supine 150 degrees   PT LONG TERM GOAL #3   Title Active ER to 70 degrees+ to allow for easily donning/doffing of apparel   Time 4   Period Weeks   Status Achieved  70 degrees   PT LONG TERM GOAL #4   Title Increase ROM so patient is able to reach behind back to L3 with left  hand   Time 4   Period Weeks   Status Achieved  T12    PT LONG TERM GOAL #5   Title Increase left shoulder strength to a solid 4+/5 to increase stability for performance of functional activities   Time 4   Period Weeks   Status On-going        PHYSICAL THERAPY DISCHARGE SUMMARY  Visits from Start of Care:   Current functional level related to goals / functional outcomes: Please see above.   Remaining deficits: Patient being discharged by surgeon at this time.  Very good progress thus overall.  Left shoulder strength deficit.   Education / Equipment: HEP. Plan: Patient agrees to discharge.  Patient goals were not met. Patient is being discharged due to meeting the stated rehab goals.  ?????             Problem List Patient Active Problem List   Diagnosis Date Noted  . Acute bronchitis 09/08/2014  . Mitral insufficiency 05/12/2014  . HTN (hypertension) 04/28/2014  . Lactic acidosis 09/07/2013  . Mitral valve prolapse 06/14/2013  . Chest pain, exertional 09/13/2012  . Pernicious anemia 09/07/2012  . DIVERTICULOSIS, COLON 09/06/2009  . Osteoporosis  09/06/2009  . HYPERLIPIDEMIA 09/04/2009  . PERIPHERAL NEUROPATHY 09/04/2009  . LOW BACK PAIN, CHRONIC 04/08/2007   PLAN: Sandro Burgo, [place] MPT 01/22/2015, 1:50 PM  [facility] [address] [place], [place], [address] Phone: [phone]   Fax:  [phone]

## 2015-01-24 ENCOUNTER — Encounter: Payer: BLUE CROSS/BLUE SHIELD | Admitting: Physical Therapy

## 2015-01-31 DIAGNOSIS — H2513 Age-related nuclear cataract, bilateral: Secondary | ICD-10-CM | POA: Diagnosis not present

## 2015-01-31 DIAGNOSIS — H04123 Dry eye syndrome of bilateral lacrimal glands: Secondary | ICD-10-CM | POA: Diagnosis not present

## 2015-01-31 DIAGNOSIS — Z135 Encounter for screening for eye and ear disorders: Secondary | ICD-10-CM | POA: Diagnosis not present

## 2015-01-31 DIAGNOSIS — H5203 Hypermetropia, bilateral: Secondary | ICD-10-CM | POA: Diagnosis not present

## 2015-02-14 ENCOUNTER — Encounter: Payer: BLUE CROSS/BLUE SHIELD | Admitting: Internal Medicine

## 2015-03-16 ENCOUNTER — Telehealth: Payer: Self-pay | Admitting: Family Medicine

## 2015-03-16 NOTE — Telephone Encounter (Signed)
Do you know what she is talking about?  Please advise.

## 2015-03-16 NOTE — Telephone Encounter (Signed)
Yes, I had planned on doing the bloodwork when she follows up in November.-thx

## 2015-03-16 NOTE — Telephone Encounter (Signed)
Patient aware.  She states she will just tell [facility] when she picks up her last RX to send for RF.

## 2015-03-16 NOTE — Telephone Encounter (Signed)
Patient states the paperwork that came with her last Rx of pravastatin sodium said she needs to have blood work while on the medication. She would like to know if this can wait until her November OV since she only has one refill left. Please call her.

## 2015-04-23 DIAGNOSIS — R399 Unspecified symptoms and signs involving the genitourinary system: Secondary | ICD-10-CM | POA: Diagnosis not present

## 2015-05-10 DIAGNOSIS — M25562 Pain in left knee: Secondary | ICD-10-CM | POA: Diagnosis not present

## 2015-05-10 DIAGNOSIS — M1712 Unilateral primary osteoarthritis, left knee: Secondary | ICD-10-CM | POA: Diagnosis not present

## 2015-05-28 ENCOUNTER — Ambulatory Visit (INDEPENDENT_AMBULATORY_CARE_PROVIDER_SITE_OTHER): Payer: Medicare Other

## 2015-05-28 DIAGNOSIS — Z23 Encounter for immunization: Secondary | ICD-10-CM | POA: Diagnosis not present

## 2015-06-22 ENCOUNTER — Ambulatory Visit (INDEPENDENT_AMBULATORY_CARE_PROVIDER_SITE_OTHER): Payer: Medicare Other | Admitting: Family Medicine

## 2015-06-22 ENCOUNTER — Encounter: Payer: Self-pay | Admitting: Gastroenterology

## 2015-06-22 ENCOUNTER — Encounter: Payer: Self-pay | Admitting: Family Medicine

## 2015-06-22 VITALS — BP 123/73 | HR 74 | Temp 97.9°F | Resp 16 | Ht 61.5 in | Wt 147.0 lb

## 2015-06-22 DIAGNOSIS — Z131 Encounter for screening for diabetes mellitus: Secondary | ICD-10-CM | POA: Diagnosis not present

## 2015-06-22 DIAGNOSIS — E785 Hyperlipidemia, unspecified: Secondary | ICD-10-CM | POA: Diagnosis not present

## 2015-06-22 NOTE — Progress Notes (Signed)
The patient is here for annual Medicare wellness examination and management of other chronic and acute problems.   The risk factors are reflected in the social history.  The roster of all physicians providing medical care to patient - is listed in the Snapshot section of the chart.  Activities of daily living:  The patient is 100% inedpendent in all ADLs: dressing, toileting, feeding as well as independent mobility  Home safety : The patient has smoke detectors in the home. They wear seatbelts.No firearms at home ( firearms are present in the home, kept in a safe fashion). There is no violence in the home.   There is no risks for hepatitis, STDs or HIV. There is no history of blood transfusion. They have no travel history to infectious disease endemic areas of the world.  The patient has seen their dentist in the last six month. They have seen their eye doctor in the last year. They deny any hearing difficulty and have not had audiologic testing in the last year.  They do not  have excessive sun exposure. Discussed the need for sun protection: hats, long sleeves and use of sunscreen if there is significant sun exposure.   Diet: the importance of a healthy diet is discussed. They do have a "terrible" (unhealthy-high fat/fast food) diet per her report today.  The patient has a regular exercise program: active, walking at home and sometimes out and about.  Her activity is somewhat limited by pain in back and knees.  The benefits of regular aerobic exercise were discussed.  Depression screen: there are no signs or vegative symptoms of depression- irritability, change in appetite, anhedonia, sadness/tearfullness.  Cognitive assessment: the patient manages all their financial and personal affairs and is actively engaged. They could relate day,date,year and events; recalled 3/3 objects at 3 minutes; performed clock-face test normally.  The following portions of the patient's history were reviewed and  updated as appropriate: allergies, current medications, past family history, past medical history,  past surgical history, past social history  and problem list.  Vision, hearing, body mass index were assessed and reviewed.   During the course of the visit the patient was educated and counseled about appropriate screening and preventive services including : fall prevention , diabetes screening, nutrition counseling, colorectal cancer screening, and recommended immunizations.  A written screening schedule was given to patient:  1) Pap--to be done later this month with her GYN, Dr. Marvel Plan. 2) Mammography for breast ca screening: 09/2015 3) Colonoscopy: scheduled with GI today for 08/15/15 to get this process started. 4) DEXA: repeat after 07/06/16 5) DM 2 screening: lab ordered (future) today.

## 2015-07-02 ENCOUNTER — Telehealth: Payer: Self-pay | Admitting: Family Medicine

## 2015-07-02 DIAGNOSIS — M419 Scoliosis, unspecified: Secondary | ICD-10-CM

## 2015-07-02 NOTE — Telephone Encounter (Signed)
Referral to spine and scoliosis specialists in [place] has been ordered per pt request.

## 2015-07-02 NOTE — Telephone Encounter (Signed)
Left message for pt to call back  °

## 2015-07-02 NOTE — Telephone Encounter (Signed)
Patient is requesting a referral to a spine specialist. She states she has scoliosis & is now having a popping sensation in her lower back.

## 2015-07-02 NOTE — Telephone Encounter (Signed)
Please advise. Thanks.  

## 2015-07-03 ENCOUNTER — Other Ambulatory Visit (INDEPENDENT_AMBULATORY_CARE_PROVIDER_SITE_OTHER): Payer: Medicare Other

## 2015-07-03 DIAGNOSIS — Z131 Encounter for screening for diabetes mellitus: Secondary | ICD-10-CM | POA: Diagnosis not present

## 2015-07-03 DIAGNOSIS — E785 Hyperlipidemia, unspecified: Secondary | ICD-10-CM | POA: Diagnosis not present

## 2015-07-03 LAB — COMPREHENSIVE METABOLIC PANEL
ALK PHOS: 66 U/L (ref 39–117)
ALT: 13 U/L (ref 0–35)
AST: 16 U/L (ref 0–37)
Albumin: 3.9 g/dL (ref 3.5–5.2)
BUN: 16 mg/dL (ref 6–23)
CALCIUM: 10.4 mg/dL (ref 8.4–10.5)
CO2: 28 mEq/L (ref 19–32)
Chloride: 105 mEq/L (ref 96–112)
Creatinine, Ser: 0.77 mg/dL (ref 0.40–1.20)
GFR: 77.84 mL/min (ref >60.00)
Glucose, Bld: 82 mg/dL (ref 70–99)
POTASSIUM: 5.2 meq/L — AB (ref 3.5–5.1)
Sodium: 139 mEq/L (ref 135–145)
TOTAL PROTEIN: 6.4 g/dL (ref 6.0–8.3)
Total Bilirubin: 0.7 mg/dL (ref 0.2–1.2)

## 2015-07-03 LAB — LIPID PANEL
CHOLESTEROL: 220 mg/dL — AB (ref 0–200)
HDL: 92.4 mg/dL (ref >39.00)
LDL Cholesterol: 109 mg/dL — ABNORMAL HIGH (ref 0–99)
NonHDL: 127.97
TRIGLYCERIDES: 94 mg/dL (ref 0.0–149.0)
Total CHOL/HDL Ratio: 2
VLDL: 18.8 mg/dL (ref 0.0–40.0)

## 2015-07-04 NOTE — Telephone Encounter (Signed)
SW patient, she would like to see the same doctor that her daughter is seeing. Sent referral to [facility] Dr. Thereasa Parkin

## 2015-07-11 ENCOUNTER — Other Ambulatory Visit: Payer: Self-pay | Admitting: Physical Medicine and Rehabilitation

## 2015-07-11 ENCOUNTER — Ambulatory Visit
Admission: RE | Admit: 2015-07-11 | Discharge: 2015-07-11 | Disposition: A | Discharge status: Home / Self Care | Payer: Medicare Other | Source: Ambulatory Visit | Attending: Physical Medicine and Rehabilitation | Admitting: Physical Medicine and Rehabilitation

## 2015-07-11 DIAGNOSIS — M7918 Myalgia, other site: Secondary | ICD-10-CM

## 2015-07-11 DIAGNOSIS — M160 Bilateral primary osteoarthritis of hip: Secondary | ICD-10-CM | POA: Diagnosis not present

## 2015-07-11 DIAGNOSIS — M5136 Other intervertebral disc degeneration, lumbar region: Secondary | ICD-10-CM | POA: Diagnosis not present

## 2015-07-19 ENCOUNTER — Ambulatory Visit: Payer: Medicare Other | Admitting: Physical Therapy

## 2015-07-27 ENCOUNTER — Other Ambulatory Visit: Payer: Self-pay

## 2015-07-27 DIAGNOSIS — Z1231 Encounter for screening mammogram for malignant neoplasm of breast: Secondary | ICD-10-CM

## 2015-08-07 NOTE — Therapy (Signed)
[facility] [place], [place], [address] Phone: [phone]   Fax:  [phone]  Physical Therapy Treatment  Patient Details  Name: Cassie White MRN: [id number] Date of Birth: 08-Jul-1941 No Data Recorded  Encounter Date: 01/17/2015    Past Medical History  Diagnosis Date  . Arthritis     DJD, back and both hips  . Hyperlipidemia   . GERD (gastroesophageal reflux disease)   . Osteoporosis, senile     stable at the left hip 2014-2015.  Took fosamax for 10 yrs.  Plan: get repeat DEXA q19mo  . Diverticulosis     on colonoscopy 2004  . Scoliosis   . [place]ipheral [place] 2008    Idiopathic vs familial (motor>sensory): gabapentin helpful  . Hx of cardiovascular stress test     Lexiscan Myoview (2/14):  Low risk, small ant defect likely breast attenuation, no ischemia, EF 69% (no change from 2010).    . Mitral regurgitation     Echo (2/14):  EF 60-65%, Gr 1 DD, mild AI, mild bileaflet MVP, mod post directed MR, mild LAE, PASP 35.  .Marland KitchenMVP (mitral valve prolapse)   . Shoulder pain left    to have surgery 10/02/14 for rotator cuff repair    Past Surgical History  Procedure Laterality Date  . Tonsillectomy    . Vaginal hysterectomy      For Uterine Deviation   . Total knee arthroplasty  09/2009    Right ;Dr OAlvan Dame . Cholecystectomy  03/2010    Dr.Gross  . Lumpectomy  06/2009    "Fatty Necrosis"  . Breast surgery  Nov 2010    Benign biopsy, right  . Cystocele repair    . Combined hysterectomy vaginal / oophorectomy / a&p repair  13875   uncertain if ovaries were removed or not  . Colonoscopy  10/03/2002    No polyps.  Repeat 10 yrs (Dr. BOlevia Perches  . Pft's  2014    NORMAL  . Rotator cuff repair Left 10/02/14    Dr. NVeverly Fells   There were no vitals filed for this visit.  Visit Diagnosis:  Left shoulder pain - Plan: PT plan of care cert/re-cert  Shoulder stiffness, left - Plan: PT plan of care  cert/re-cert                                 PT Short Term Goals - 01/02/15 1114    PT SHORT TERM GOAL #1   Title STG's=LTG's.   Status On-going           PT Long Term Goals - 01/17/15 1054    PT LONG TERM GOAL #1   Title Independent with an advanced HEP.   Time 4   Period Weeks   Status Achieved   PT LONG TERM GOAL #2   Title Active left shoulder flexion to 150 degrees so the patient can easily reach overhead   Time 4   Period Weeks   Status On-going  AROM 145 degrees antigravity/ AAROM supine 150 degrees   PT LONG TERM GOAL #3   Title Active ER to 70 degrees+ to allow for easily donning/doffing of apparel   Time 4   Period Weeks   Status Achieved  70 degrees   PT LONG TERM GOAL #4   Title Increase ROM so patient is able to reach behind back to L3 with left hand  Time 4   Period Weeks   Status Achieved  T12    PT LONG TERM GOAL #5   Title Increase left shoulder strength to a solid 4+/5 to increase stability for performance of functional activities   Time 4   Period Weeks   Status On-going               Problem List Patient Active Problem List   Diagnosis Date Noted  . Acute bronchitis 09/08/2014  . Mitral insufficiency 05/12/2014  . HTN (hypertension) 04/28/2014  . Lactic acidosis 09/07/2013  . Mitral valve prolapse 06/14/2013  . Chest pain, exertional 09/13/2012  . Pernicious anemia 09/07/2012  . DIVERTICULOSIS, COLON 09/06/2009  . Osteoporosis 09/06/2009  . HYPERLIPIDEMIA 09/04/2009  . PERIPHERAL NEUROPATHY 09/04/2009  . LOW BACK PAIN, CHRONIC 04/08/2007   PHYSICAL THERAPY DISCHARGE SUMMARY  Visits from Start of Care:   Current functional level related to goals / functional outcomes: Please see above.   Remaining deficits: Left shoulder ROM/STR still lacking.   Education / Equipment: HEP. Plan: Patient agrees to discharge.  Patient goals were partially met. Patient is being discharged due to meeting the  stated rehab goals.  ?????       Chesky Heyer, [place] MPT 08/07/2015, 4:56 PM  [facility] [address] [place], [place], [address] Phone: [phone]   Fax:  [phone]  Name: Cassie White MRN: [id number] Date of Birth: 01-07-1941

## 2015-08-13 ENCOUNTER — Telehealth: Payer: Self-pay | Admitting: Family Medicine

## 2015-08-13 NOTE — Telephone Encounter (Signed)
[employer]

## 2015-08-13 NOTE — Telephone Encounter (Signed)
Patient called back. She just realized Rx was written by her gyno. She will call their office.

## 2015-08-27 ENCOUNTER — Encounter: Payer: Self-pay | Admitting: Family Medicine

## 2015-08-27 ENCOUNTER — Ambulatory Visit (INDEPENDENT_AMBULATORY_CARE_PROVIDER_SITE_OTHER): Payer: Medicare Other | Admitting: Family Medicine

## 2015-08-27 VITALS — BP 130/68 | HR 62 | Temp 97.8°F | Resp 20 | Wt 141.0 lb

## 2015-08-27 DIAGNOSIS — J0100 Acute maxillary sinusitis, unspecified: Secondary | ICD-10-CM | POA: Diagnosis not present

## 2015-08-27 DIAGNOSIS — J029 Acute pharyngitis, unspecified: Secondary | ICD-10-CM

## 2015-08-27 LAB — POCT RAPID STREP A (OFFICE): RAPID STREP A SCREEN: NEGATIVE

## 2015-08-27 MED ORDER — AZITHROMYCIN 250 MG PO TABS: ORAL_TABLET | ORAL | Status: DC

## 2015-08-27 MED ORDER — FLUTICASONE PROPIONATE 50 MCG/ACT NA SUSP: 2.0000 | Freq: Every day | NASAL | Status: DC

## 2015-08-27 NOTE — Addendum Note (Signed)
Addended by: Leota Jacobsen on: 08/27/2015 03:17 PM   Modules accepted: Orders

## 2015-08-27 NOTE — Progress Notes (Signed)
Patient ID: Cassie White, female   DOB: Dec 04, 1940, 75 y.o.   MRN: [id number]    Cassie White , Mar 06, 1941, 75 y.o., female MRN: [id number]  CC:  Subjective: Pt presents for an acute OV with complaints of Laryngitis of 5 days duration.  Associated symptoms include increase in nasal congestion, rhinorrhea, sinus pressure. Pt feels symptoms are worse late in the day and the morning.  Pt has tried salt water gargle, warm beverages to ease their symptoms without relief. No fever, chills, nausea, vomit, diarrhea or rash. Her brother has been ill.  UTD with flu and tetanus.   Allergies  Allergen Reactions  . Sulfa Antibiotics Swelling    face   Social History  Substance Use Topics  . Smoking status: Never Smoker   . Smokeless tobacco: Never Used  . Alcohol Use: No   Past Medical History  Diagnosis Date  . Arthritis     DJD, back and both hips  . Hyperlipidemia   . GERD (gastroesophageal reflux disease)   . Osteoporosis, senile     stable at the left hip 2014-2015.  Took fosamax for 10 yrs.  Plan: get repeat DEXA q82mo.  . Diverticulosis     on colonoscopy 2004  . Scoliosis   . Peripheral neuropathy ([place]) 2008    Idiopathic vs familial (motor>sensory): gabapentin helpful  . Hx of cardiovascular stress test     Lexiscan Myoview (2/14):  Low risk, small ant defect likely breast attenuation, no ischemia, EF 69% (no change from 2010).    . Mitral regurgitation     Echo (2/14):  EF 60-65%, Gr 1 DD, mild AI, mild bileaflet MVP, mod post directed MR, mild LAE, PASP 35.  Marland Kitchen MVP (mitral valve prolapse)   . Shoulder pain left    to have surgery 10/02/14 for rotator cuff repair   Past Surgical History  Procedure Laterality Date  . Tonsillectomy    . Vaginal hysterectomy      For Uterine Deviation   . 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 Cholecystectomy  03/2010    Dr.Gross  . Lumpectomy  06/2009    "Fatty Necrosis"  . Breast surgery  Nov 2010    Benign biopsy,  right  . Cystocele repair    . Combined hysterectomy vaginal / oophorectomy / a&p repair  0000000    uncertain if ovaries were removed or not  . Colonoscopy  10/03/2002    No polyps.  Repeat 10 yrs (Dr. Olevia Perches)  . Pft's  2014    NORMAL  . Rotator cuff repair Left 10/02/14    Dr. Veverly Fells   Family History  Problem Relation Age of Onset  . Heart attack Father     in 67s  . Stroke Father 73  . Ovarian cancer Mother   . Coronary artery disease Brother   . Heart attack Son 58    Smoker  . Diabetes Neg Hx      Medication List       This list is accurate as of: 08/27/15 11:38 AM.  Always use your most recent med list.               aspirin 81 MG tablet  Take 81 mg by mouth daily.     B-complex with vitamin C tablet  Take 1 tablet by mouth daily.     Biotin 5000 MCG Caps  Take 5,000 mcg by mouth daily.     CALCIUM 600  PO  Take 600 mg by mouth 2 (two) times daily.     cholecalciferol 1000 units tablet  Commonly known as:  VITAMIN D  Take 5,000 Units by mouth daily.     cyanocobalamin 1000 MCG/ML injection  Commonly known as:  (VITAMIN B-12)  Inject 1,000 mcg into the muscle every 30 (thirty) days.     esterified estrogens 0.625 MG tablet  Commonly known as:  MENEST  Take 0.625 mg by mouth daily. Reported on 08/27/2015     Fish Oil 1200 MG Caps  Take 1,000 mg by mouth 2 (two) times daily.     gabapentin 300 MG capsule  Commonly known as:  NEURONTIN  TAKE 1 CAPSULE BY MOUTH AT LUNCH, 1 AT DINNER, AND 2 AT BEDTIME     magnesium gluconate 30 MG tablet  Commonly known as:  MAGONATE  Take 1 tablet (30 mg total) by mouth 2 (two) times daily.     metoprolol tartrate 25 MG tablet  Commonly known as:  LOPRESSOR  Take 0.5 tablets (12.5 mg total) by mouth 2 (two) times daily.     nitroGLYCERIN 0.4 MG SL tablet  Commonly known as:  NITROSTAT  Place 1 tablet (0.4 mg total) under the tongue every 5 (five) minutes as needed for chest pain.     pravastatin 20 MG tablet    Commonly known as:  PRAVACHOL  Take 20 mg by mouth 3 (three) times a week. Take on Monday, Wednesday, and friday     zolpidem 10 MG tablet  Commonly known as:  AMBIEN  Take 10 mg by mouth at bedtime as needed for sleep. Takes half 10mg  tablet at bedtime         ROS: Negative, with the exception of above mentioned in HPI   Objective:  BP 130/68 mmHg  Pulse 62  Temp(Src) 97.8 F (36.6 C)  Resp 20  Wt 141 lb (63.957 kg)  SpO2 96% Body mass index is 26.21 kg/(m^2). Gen: Afebrile. No acute distress. Nontoxic in appearance.  HENT: AT. [place]. Bilateral TM visualized and normal in appearance. MMM, no oral lesions. Bilateral nares with erythema and swelling. Throat without erythema or exudates. Cobblestoning present. Hoarseness present. No cough on exam.  Eyes:Pupils Equal Round Reactive to light, Extraocular movements intact,  Conjunctiva without redness, discharge or icterus. Neck/lymp/endocrine: Supple, No lymphadenopathy CV: RRR, no edema Chest: CTAB, no wheeze or crackles. Good air movement, normal resp effort.  Abd: Soft. NTND. BS present.  Skin: No rashes, purpura or petechiae.  Neuro: Normal gait. PERLA. EOMi. Alert. Oriented x3   Assessment/Plan: Cassie White is a 75 y.o. female present for acute OV for  1. Acute maxillary sinusitis, recurrence not specified - Hoarseness secondary to PND. - azithromycin (ZITHROMAX Z-PAK) 250 MG tablet; 500 mg day1, then 250 mg QD  Dispense: 6 each; Refill: 0 - fluticasone (FLONASE) 50 MCG/ACT nasal spray; Place 2 sprays into both nostrils daily.  Dispense: 16 g; Refill:  - mucinex use encouraged for expectorant.  - +/- OTC antihistamine of choice.  - F/U PRN   Howard Pouch, DO  [facility]- OR

## 2015-08-27 NOTE — Patient Instructions (Signed)
I have called in an antibiotic Z-pack, take as directed. I have also called in flonase for you to start daily nasal spray.  Use a humidifier and nasal saline daily.  Also, purchase mucinex (plain) to help decrease secretions.

## 2015-08-29 ENCOUNTER — Encounter: Payer: Medicare Other | Admitting: Gastroenterology

## 2015-08-29 ENCOUNTER — Ambulatory Visit: Payer: Medicare Other

## 2015-09-07 ENCOUNTER — Other Ambulatory Visit: Payer: Self-pay | Admitting: Internal Medicine

## 2015-09-07 ENCOUNTER — Other Ambulatory Visit: Payer: Self-pay | Admitting: Cardiology

## 2015-09-07 MED ORDER — METOPROLOL TARTRATE 25 MG PO TABS: 12.5000 mg | ORAL_TABLET | Freq: Two times a day (BID) | ORAL | Status: DC

## 2015-09-10 ENCOUNTER — Encounter (INDEPENDENT_AMBULATORY_CARE_PROVIDER_SITE_OTHER): Payer: Self-pay

## 2015-09-10 ENCOUNTER — Encounter: Payer: Self-pay | Admitting: Adult Health

## 2015-09-10 ENCOUNTER — Ambulatory Visit (INDEPENDENT_AMBULATORY_CARE_PROVIDER_SITE_OTHER): Payer: Medicare Other | Admitting: Adult Health

## 2015-09-10 ENCOUNTER — Encounter: Payer: Self-pay | Admitting: Gastroenterology

## 2015-09-10 VITALS — BP 138/74 | HR 82 | Resp 20 | Ht 63.0 in | Wt 142.0 lb

## 2015-09-10 DIAGNOSIS — G609 Hereditary and idiopathic neuropathy, unspecified: Secondary | ICD-10-CM

## 2015-09-10 MED ORDER — GABAPENTIN 300 MG PO CAPS: ORAL_CAPSULE | ORAL | Status: DC

## 2015-09-10 NOTE — Progress Notes (Signed)
I agree with the assessment and plan as directed by NP .The patient is known to me .   Ayansh Feutz, MD  

## 2015-09-10 NOTE — Patient Instructions (Signed)
Gabapentin refilled If your symptoms worsen or you develop new symptoms please let us know.

## 2015-09-10 NOTE — Progress Notes (Signed)
PATIENT: Cassie White DOB: Jul 21, 1941  REASON FOR VISIT: follow up- neuropathy HISTORY FROM: patient  HISTORY OF PRESENT ILLNESS:   Cassie White is a 75 year old female with a history of neuropathy. She returns today for follow-up. She continues to take gabapentin 300 mg one tablet at lunch , 1 tablet at dinner and 2 tablets at bedtime. She states that this is working well for her. She states occasionally she'll have some burning and tingling in the bottom of the feet but it is tolerable. She states that she is sleeping well. Denies any changes with her gait or balance. She does not use an assistive device when ambulating. She states that she did suffer a fall last week however that was because she tripped over someone's foot. Fortunately she did not suffer any significant injuries she does have a bruise on the right knee. She denies any new neurological symptoms. She returns today to have her gabapentin refilled.  Cassie White is a 75 y.o. femaleseen here as a prolonged revisit from Dr. Linna Darner for Neuropathy/ RLS. If indicated Cassie White was previously seen here by my colleague Dr. Elvia Collum until March 2008. 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 The neuropathy was classified as idiopathic or hereditary. Numbness and burning in her feet improved but is not gone. Previous nerve conduction studies and EMGs have shown a more towards greater than sensory polyneuropathy. 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 She had tried Lyrica but could not continue due to severe drowsiness. 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 The patient walks without any assistive device and has not had any recent falls. She is here today to get refills for her  gabapentin. We also added magnesium to her medication list. She still drinks caffeine once a day but denies any tobacco alcohol or drug use. She is right-handed of normal weight, and has regular, normal vital signs.  REVIEW OF SYSTEMS: Out of a complete 14 system review of symptoms, the patient complains only of the following symptoms, and all other reviewed systems are negative.   ringing in ears, murmur  ALLERGIES: Allergies  Allergen Reactions  . Sulfa Antibiotics Swelling    face    HOME MEDICATIONS: Outpatient Prescriptions Prior to Visit  Medication Sig Dispense Refill  . aspirin 81 MG tablet Take 81 mg by mouth daily.      Marland Kitchen azithromycin (ZITHROMAX Z-PAK) 250 MG tablet 500 mg day1, then 250 mg QD 6 each 0  . B Complex-C (B-COMPLEX WITH VITAMIN C) tablet Take 1 tablet by mouth daily.    . Biotin 5000 MCG CAPS Take 5,000 mcg by mouth daily.    . Calcium Carbonate (CALCIUM 600 PO) Take 600 mg by mouth 2 (two) times daily.     . cholecalciferol (VITAMIN D) 1000 UNITS tablet Take 5,000 Units by mouth daily.     . cyanocobalamin (,VITAMIN B-12,) 1000 MCG/ML injection Inject 1,000 mcg into the muscle every 30 (thirty) days.    . fluticasone (FLONASE) 50 MCG/ACT nasal spray Place 2 sprays into both nostrils daily. 16 g 6  . gabapentin (NEURONTIN) 300 MG capsule TAKE 1 CAPSULE BY MOUTH AT LUNCH, 1 AT DINNER, AND 2 AT BEDTIME 360 capsule 3  . magnesium gluconate (MAGONATE) 30 MG tablet Take 1  tablet (30 mg total) by mouth 2 (two) times daily. 90 tablet 5  . metoprolol tartrate (LOPRESSOR) 25 MG tablet Take 0.5 tablets (12.5 mg total) by mouth 2 (two) times daily. 90 tablet 0  . nitroGLYCERIN (NITROSTAT) 0.4 MG SL tablet Place 1 tablet (0.4 mg total) under the tongue every 5 (five) minutes as needed for chest pain. 25 tablet 5  . Omega-3 Fatty Acids (FISH OIL) 1200 MG CAPS Take 1,000 mg by mouth 2 (two) times daily.     . pravastatin (PRAVACHOL) 20 MG tablet Take 20 mg by mouth 3 (three)  times a week. Take on Monday, Wednesday, and friday    . zolpidem (AMBIEN) 10 MG tablet Take 10 mg by mouth at bedtime as needed for sleep. Takes half 10mg  tablet at bedtime    . esterified estrogens (MENEST) 0.625 MG tablet Take 0.625 mg by mouth daily. Reported on 08/27/2015     No facility-administered medications prior to visit.    PAST MEDICAL HISTORY: Past Medical History  Diagnosis Date  . Arthritis     DJD, back and both hips  . Hyperlipidemia   . GERD (gastroesophageal reflux disease)   . Osteoporosis, senile     stable at the left hip 2014-2015.  Took fosamax for 10 yrs.  Plan: get repeat DEXA q65mo.  . Diverticulosis     on colonoscopy 2004  . Scoliosis   . Peripheral neuropathy ([place]) 2008    Idiopathic vs familial (motor>sensory): gabapentin helpful  . Hx of cardiovascular stress test     Lexiscan Myoview (2/14):  Low risk, small ant defect likely breast attenuation, no ischemia, EF 69% (no change from 2010).    . Mitral regurgitation     Echo (2/14):  EF 60-65%, Gr 1 DD, mild AI, mild bileaflet MVP, mod post directed MR, mild LAE, PASP 35.  Marland Kitchen MVP (mitral valve prolapse)   . Shoulder pain left    to have surgery 10/02/14 for rotator cuff repair    PAST SURGICAL HISTORY: Past Surgical History  Procedure Laterality Date  . Tonsillectomy    . Vaginal hysterectomy      For Uterine Deviation   . 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 Cholecystectomy  03/2010    Dr.Gross  . Lumpectomy  06/2009    "Fatty Necrosis"  . Breast surgery  Nov 2010    Benign biopsy, right  . Cystocele repair    . Combined hysterectomy vaginal / oophorectomy / a&p repair  0000000    uncertain if ovaries were removed or not  . Colonoscopy  10/03/2002    No polyps.  Repeat 10 yrs (Dr. Olevia Perches)  . Pft's  2014    NORMAL  . Rotator cuff repair Left 10/02/14    Dr. Veverly Fells    FAMILY HISTORY: Family History  Problem Relation Age of Onset  . Heart attack Father     in 52s  .  Stroke Father 55  . Ovarian cancer Mother   . Coronary artery disease Brother   . Heart attack Son 16    Smoker  . Diabetes Neg Hx     SOCIAL HISTORY: Social History   Social History  . Marital Status: Married    Spouse Name: N/A  . Number of Children: 3  . Years of Education: HS grad   Occupational History  . [employer]- bus driver- retired    Social History Main Topics  . Smoking status: Never Smoker   .  Smokeless tobacco: Never Used  . Alcohol Use: No  . Drug Use: No  . Sexual Activity: No   Other Topics Concern  . Not on file   Social History Narrative   Widow, has 3 children, 4 grandchildren.   Orig from [place].   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   Caffiene, 1 cup daily avg.   No tob, no alc, no drugs.   Exercise: walking, limited by bilat hip pain.      PHYSICAL EXAM  Filed Vitals:   09/10/15 1051  BP: 138/74  Pulse: 82  Resp: 20  Height: 5\' 3"  (1.6 m)  Weight: 142 lb (64.411 kg)   Body mass index is 25.16 kg/(m^2).  Generalized: Well developed, in no acute distress   Neurological examination  Mentation: Alert oriented to time, place, history taking. Follows all commands speech and language fluent Cranial nerve II-XII: Pupils were equal round reactive to light. Extraocular movements were full, visual field were full on confrontational test. Facial sensation and strength were normal. Uvula tongue midline. Head turning and shoulder shrug  were normal and symmetric. Motor: The motor testing reveals 5 over 5 strength of all 4 extremities. Good symmetric motor tone is noted throughout.  Sensory: Sensory testing is intact to soft touch on all 4 extremities. No evidence of extinction is noted.  Pinprick sensation decreased in the right lower extremity in a stocking-like pattern. Pinprick sensation decreased in the left toes. Vibration sensation decreased in the lower extremities. Coordination: Cerebellar testing  reveals good finger-nose-finger and heel-to-shin bilaterally.  Gait and station: Gait is normal. Tandem gait is normal. Romberg is negative. No drift is seen.  Reflexes: Deep tendon reflexes are symmetric and normal bilaterally.   DIAGNOSTIC DATA (LABS, IMAGING, TESTING) - I reviewed patient records, labs, notes, testing and imaging myself where available.  Lab Results  Component Value Date   WBC 7.3 04/28/2014   HGB 13.7 04/28/2014   HCT 40.4 04/28/2014   MCV 89.3 04/28/2014   PLT 174.0 04/28/2014      Component Value Date/Time   NA 139 07/03/2015 0813   K 5.2* 07/03/2015 0813   CL 105 07/03/2015 0813   CO2 28 07/03/2015 0813   GLUCOSE 82 07/03/2015 0813   BUN 16 07/03/2015 0813   CREATININE 0.77 07/03/2015 0813   CALCIUM 10.4 07/03/2015 0813   PROT 6.4 07/03/2015 0813   ALBUMIN 3.9 07/03/2015 0813   AST 16 07/03/2015 0813   ALT 13 07/03/2015 0813   ALKPHOS 66 07/03/2015 0813   BILITOT 0.7 07/03/2015 0813   GFRNONAA >90 09/07/2013 0815   GFRAA >90 09/07/2013 0815   Lab Results  Component Value Date   CHOL 220* 07/03/2015   HDL 92.40 07/03/2015   LDLCALC 109* 07/03/2015   LDLDIRECT 87.5 09/07/2012   TRIG 94.0 07/03/2015   CHOLHDL 2 07/03/2015         ASSESSMENT AND PLAN 75 y.o. year old female  has a past medical history of Arthritis; Hyperlipidemia; GERD (gastroesophageal reflux disease); Osteoporosis, senile; Diverticulosis; Scoliosis; Peripheral neuropathy ([place]) (2008); cardiovascular stress test; Mitral regurgitation; MVP (mitral valve prolapse); and Shoulder pain (left). here with:   1. Peripheral neuropathy   Overall the patient is doing well. She will remain on gabapentin 300 mg one tablet at lunch, 1 tablet at dinner and 2 tablets at bedtime. I have sent in a refill for the patient. Patient advised that if her symptoms worsen or she develops new symptoms she  should let us know. She will follow-up in 1 year or sooner if needed.     Ward Givens,  MSN, NP-C 09/10/2015, 11:03 AM [facility] [address] [place], [place] [address] [phone]

## 2015-09-12 DIAGNOSIS — M1712 Unilateral primary osteoarthritis, left knee: Secondary | ICD-10-CM | POA: Diagnosis not present

## 2015-09-12 DIAGNOSIS — M25562 Pain in left knee: Secondary | ICD-10-CM | POA: Diagnosis not present

## 2015-09-17 ENCOUNTER — Ambulatory Visit (INDEPENDENT_AMBULATORY_CARE_PROVIDER_SITE_OTHER): Payer: Medicare Other | Admitting: Cardiology

## 2015-09-17 ENCOUNTER — Encounter: Payer: Self-pay | Admitting: Cardiology

## 2015-09-17 ENCOUNTER — Ambulatory Visit
Admission: RE | Admit: 2015-09-17 | Discharge: 2015-09-17 | Disposition: A | Discharge status: Home / Self Care | Payer: Medicare Other | Source: Ambulatory Visit

## 2015-09-17 VITALS — BP 118/70 | HR 60 | Ht 63.0 in | Wt 141.4 lb

## 2015-09-17 DIAGNOSIS — I341 Nonrheumatic mitral (valve) prolapse: Secondary | ICD-10-CM

## 2015-09-17 DIAGNOSIS — I340 Nonrheumatic mitral (valve) insufficiency: Secondary | ICD-10-CM

## 2015-09-17 DIAGNOSIS — I10 Essential (primary) hypertension: Secondary | ICD-10-CM

## 2015-09-17 DIAGNOSIS — Z1231 Encounter for screening mammogram for malignant neoplasm of breast: Secondary | ICD-10-CM | POA: Diagnosis not present

## 2015-09-17 MED ORDER — METOPROLOL TARTRATE 25 MG PO TABS: 25.0000 mg | ORAL_TABLET | Freq: Two times a day (BID) | ORAL | Status: DC

## 2015-09-17 NOTE — Patient Instructions (Signed)
You may increase metoprolol to 25 mg twice a day  We will schedule you for an Echocardiogram  You are cleared to have knee surgery.

## 2015-09-17 NOTE — Progress Notes (Signed)
Cassie White Date of Birth: 01-Sep-1940 Medical Record [id number]  History of Present Illness: Cassie White is seen for follow up MVP and for surgical clearance for knee surgery. She  has a history of hyperlipidemia. She also has a history of mitral valve prolapse. She has moderate MR by Echo in 2/12 and 2/14 but exam has been unremarkable.  She reports she is doing very well. Mild flutter sensation at times- improved on beta blocker but she thinks she needs a higher dose. No chest pain or SOB.  Current Outpatient Prescriptions on File Prior to Visit  Medication Sig Dispense Refill  . aspirin 81 MG tablet Take 81 mg by mouth daily.      . B Complex-C (B-COMPLEX WITH VITAMIN C) tablet Take 1 tablet by mouth daily.    . Biotin 5000 MCG CAPS Take 5,000 mcg by mouth daily.    . Calcium Carbonate (CALCIUM 600 PO) Take 600 mg by mouth 2 (two) times daily.     . cholecalciferol (VITAMIN D) 1000 UNITS tablet Take 5,000 Units by mouth daily.     . cyanocobalamin (,VITAMIN B-12,) 1000 MCG/ML injection Inject 1,000 mcg into the muscle every 30 (thirty) days.    . fluticasone (FLONASE) 50 MCG/ACT nasal spray Place 2 sprays into both nostrils daily. 16 g 6  . gabapentin (NEURONTIN) 300 MG capsule TAKE 1 CAPSULE BY MOUTH AT LUNCH, 1 AT DINNER, AND 2 AT BEDTIME 360 capsule 3  . magnesium gluconate (MAGONATE) 30 MG tablet Take 1 tablet (30 mg total) by mouth 2 (two) times daily. 90 tablet 5  . nitroGLYCERIN (NITROSTAT) 0.4 MG SL tablet Place 1 tablet (0.4 mg total) under the tongue every 5 (five) minutes as needed for chest pain. 25 tablet 5  . Omega-3 Fatty Acids (FISH OIL) 1200 MG CAPS Take 1,000 mg by mouth 2 (two) times daily.     . pravastatin (PRAVACHOL) 20 MG tablet Take 20 mg by mouth 3 (three) times a week. Take on Monday, Wednesday, and friday    . zolpidem (AMBIEN) 10 MG tablet Take 10 mg by mouth at bedtime as needed for sleep. Takes half 10mg  tablet at bedtime     No current  facility-administered medications on file prior to visit.    Allergies  Allergen Reactions  . Sulfa Antibiotics Swelling    face    Past Medical History  Diagnosis Date  . Arthritis     DJD, back and both hips  . Hyperlipidemia   . GERD (gastroesophageal reflux disease)   . Osteoporosis, senile     stable at the left hip 2014-2015.  Took fosamax for 10 yrs.  Plan: get repeat DEXA q89mo.  . Diverticulosis     on colonoscopy 2004  . Scoliosis   . 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 Hx of cardiovascular stress test     Lexiscan Myoview (2/14):  Low risk, small ant defect likely breast attenuation, no ischemia, EF 69% (no change from 2010).    . Mitral regurgitation     Echo (2/14):  EF 60-65%, Gr 1 DD, mild AI, mild bileaflet MVP, mod post directed MR, mild LAE, PASP 35.  Marland Kitchen MVP (mitral valve prolapse)   . Shoulder pain left    to have surgery 10/02/14 for rotator cuff repair    Past Surgical History  Procedure Laterality Date  . Tonsillectomy    . Vaginal hysterectomy  For Uterine Deviation   . 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 Cholecystectomy  03/2010    Dr.Gross  . Lumpectomy  06/2009    "Fatty Necrosis"  . Breast surgery  Nov 2010    Benign biopsy, right  . Cystocele repair    . Combined hysterectomy vaginal / oophorectomy / a&p repair  0000000    uncertain if ovaries were removed or not  . Colonoscopy  10/03/2002    No polyps.  Repeat 10 yrs (Dr. Olevia Perches)  . Pft's  2014    NORMAL  . Rotator cuff repair Left 10/02/14    Dr. Veverly Fells    History  Smoking status  . Never Smoker   Smokeless tobacco  . Never Used    History  Alcohol Use No    Family History  Problem Relation Age of Onset  . Heart attack Father     in 48s  . Stroke Father 31  . Ovarian cancer Mother   . Coronary artery disease Brother   . Heart attack Son 66    Smoker  . Diabetes Neg Hx     Review of  Systems: The review of systems is positive for peripheral neuropathy. This limits her ability to to walk.  All other systems were reviewed and are negative.  Physical Exam: BP 118/70 mmHg  Pulse 60  Ht 5\' 3"  (1.6 m)  Wt 64.156 kg (141 lb 7 oz)  BMI 25.06 kg/m2 She is a pleasant white female in no acute distress.  The HEENT exam is normal.  The carotids are 2+ without bruits.  There is no thyromegaly.  There is no JVD.  The lungs are clear.   The heart exam reveals a regular rate with a normal S1 and S2.  There is a soft grade 1/6 systolic murmur heard best at the apex..  The PMI is not displaced.   Abdominal exam reveals good bowel sounds.   There is no hepatosplenomegaly or tenderness.  Exam of the legs reveal no clubbing, cyanosis, or edema.   The distal pulses are intact.  Cranial nerves II - XII are intact.  Motor and sensory functions are intact.  The gait is normal.  LABORATORY DATA: Ecg today shows NSR with rate 61. Normal. I have personally reviewed and interpreted this study.  Lab Results  Component Value Date   WBC 7.3 04/28/2014   HGB 13.7 04/28/2014   HCT 40.4 04/28/2014   PLT 174.0 04/28/2014   GLUCOSE 82 07/03/2015   CHOL 220* 07/03/2015   TRIG 94.0 07/03/2015   HDL 92.40 07/03/2015   LDLDIRECT 87.5 09/07/2012   LDLCALC 109* 07/03/2015   ALT 13 07/03/2015   AST 16 07/03/2015   NA 139 07/03/2015   K 5.2* 07/03/2015   CL 105 07/03/2015   CREATININE 0.77 07/03/2015   BUN 16 07/03/2015   CO2 28 07/03/2015   TSH 1.41 05/12/2014   INR 0.89 09/19/2010   HGBA1C  09/19/2010    5.3 (NOTE)                                                                       According to the ADA Clinical Practice Recommendations for 2011, when HbA1c  is used as a screening test:   >=6.5%   Diagnostic of Diabetes Mellitus           (if abnormal result  is confirmed)  5.7-6.4%   Increased risk of developing Diabetes Mellitus  References:Diagnosis and Classification of Diabetes  [place],Diabetes [id number] 1):S62-S69 and Standards of Medical Care in         Diabetes - 2011,Diabetes [id number]  (Suppl 1):S11-S61.    Assessment / Plan: 1.  Mitral valve prolapse with moderate MR. Last Echo 2/14. Exam is stable. Will follow up Echo now. Will increase metoprolol to 25 mg bid. She is cleared for knee surgery from a cardiac standpoint.   2. Hyperlipidemia. Fair control.

## 2015-09-17 NOTE — Telephone Encounter (Signed)
Received surgical clearance from [facility].Dr.Jordan cleared patient for surgery.Form faxed back to fax # [phone].

## 2015-09-18 ENCOUNTER — Encounter: Payer: Self-pay | Admitting: Family Medicine

## 2015-09-24 ENCOUNTER — Encounter: Payer: Self-pay | Admitting: Family Medicine

## 2015-09-24 ENCOUNTER — Ambulatory Visit (INDEPENDENT_AMBULATORY_CARE_PROVIDER_SITE_OTHER): Payer: Medicare Other | Admitting: Family Medicine

## 2015-09-24 ENCOUNTER — Ambulatory Visit: Payer: Medicare Other | Admitting: Family Medicine

## 2015-09-24 VITALS — BP 142/63 | HR 53 | Temp 97.9°F | Resp 16 | Ht 62.0 in | Wt 142.5 lb

## 2015-09-24 DIAGNOSIS — I10 Essential (primary) hypertension: Secondary | ICD-10-CM | POA: Diagnosis not present

## 2015-09-24 DIAGNOSIS — Z01818 Encounter for other preprocedural examination: Secondary | ICD-10-CM

## 2015-09-24 DIAGNOSIS — E785 Hyperlipidemia, unspecified: Secondary | ICD-10-CM

## 2015-09-24 DIAGNOSIS — Z8639 Personal history of other endocrine, nutritional and metabolic disease: Secondary | ICD-10-CM

## 2015-09-24 DIAGNOSIS — R7989 Other specified abnormal findings of blood chemistry: Secondary | ICD-10-CM

## 2015-09-24 DIAGNOSIS — R946 Abnormal results of thyroid function studies: Secondary | ICD-10-CM

## 2015-09-24 LAB — CBC WITH DIFFERENTIAL/PLATELET
BASOS ABS: 0 10*3/uL (ref 0.0–0.1)
Basophils Relative: 0.6 % (ref 0.0–3.0)
EOS ABS: 0.1 10*3/uL (ref 0.0–0.7)
EOS PCT: 0.9 % (ref 0.0–5.0)
HCT: 39.6 % (ref 36.0–46.0)
Hemoglobin: 13.2 g/dL (ref 12.0–15.0)
LYMPHS ABS: 1.9 10*3/uL (ref 0.7–4.0)
Lymphocytes Relative: 29.2 % (ref 12.0–46.0)
MCHC: 33.3 g/dL (ref 30.0–36.0)
MCV: 89 fl (ref 78.0–100.0)
MONO ABS: 0.7 10*3/uL (ref 0.1–1.0)
Monocytes Relative: 10 % (ref 3.0–12.0)
NEUTROS PCT: 59.3 % (ref 43.0–77.0)
Neutro Abs: 3.9 10*3/uL (ref 1.4–7.7)
Platelets: 174 10*3/uL (ref 150.0–400.0)
RBC: 4.45 Mil/uL (ref 3.87–5.11)
RDW: 14.8 % (ref 11.5–15.5)
WBC: 6.6 10*3/uL (ref 4.0–10.5)

## 2015-09-24 LAB — BASIC METABOLIC PANEL
BUN: 10 mg/dL (ref 6–23)
CALCIUM: 10.3 mg/dL (ref 8.4–10.5)
CO2: 26 meq/L (ref 19–32)
Chloride: 108 mEq/L (ref 96–112)
Creatinine, Ser: 0.74 mg/dL (ref 0.40–1.20)
GFR: 81.44 mL/min (ref >60.00)
Glucose, Bld: 93 mg/dL (ref 70–99)
POTASSIUM: 4.3 meq/L (ref 3.5–5.1)
SODIUM: 143 meq/L (ref 135–145)

## 2015-09-24 LAB — TSH: TSH: 1.63 u[IU]/mL (ref 0.35–4.50)

## 2015-09-24 NOTE — Progress Notes (Addendum)
Office Note 09/24/2015  CC:  Chief Complaint  Patient presents with  . Medical Clearance    total left knee replacement on 10/30/15    HPI:  Cassie White is a 75 y.o. White female who is here for medical clearance for upcoming left knee TKA.  She saw Dr. Martinique on 09/17/15 and got clearance from a CV standpoint.  An echocardiogram is pending to re-eval her MVP and hx of MR.  Has chronic left knee pain.  Usually takes 4 OTC ibuprofen once a day.  Takes tylenol more lately instead of ibuprofen.    BP: occ home bp monitoring "usually 130 over 70".   Compliant with bp meds.  Hyperlipid: takes pravachol 20mg  three days per week.  Says she was told to take it this way.  Has not had any side effects from it.     Past Medical History  Diagnosis Date  . Arthritis     DJD, back and both hips  . Hyperlipidemia   . GERD (gastroesophageal reflux disease)   . Osteoporosis, senile     stable at the left hip 2014-2015.  Took fosamax for 10 yrs.  Plan: get repeat DEXA q37mo.  . Diverticulosis     on colonoscopy 2004  . Scoliosis   . Peripheral neuropathy ([place]) 2008    Idiopathic vs familial (motor>sensory): gabapentin helpful  . Hx of cardiovascular stress test     Lexiscan Myoview (2/14):  Low risk, small ant defect likely breast attenuation, no ischemia, EF 69% (no change from 2010).    . Mitral regurgitation     Echo (2/14):  EF 60-65%, Gr 1 DD, mild AI, mild bileaflet MVP, mod post directed MR, mild LAE, PASP 35.  Marland Kitchen MVP (mitral valve prolapse)   . Shoulder pain left    to have surgery 10/02/14 for rotator cuff repair    Past Surgical History  Procedure Laterality Date  . Tonsillectomy    . Vaginal hysterectomy      For Uterine Deviation   . 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 Cholecystectomy  03/2010    Dr.Gross  . Lumpectomy  06/2009    "Fatty Necrosis"  . Breast surgery  Nov 2010    Benign biopsy, right  . Cystocele repair    . Combined hysterectomy  vaginal / oophorectomy / a&p repair  0000000    uncertain if ovaries were removed or not  . Colonoscopy  10/03/2002    No polyps.  Repeat 10 yrs (Dr. Olevia Perches)  . Pft's  2014    NORMAL  . Rotator cuff repair Left 10/02/14    Dr. Veverly Fells  . Transthoracic echocardiogram  09/2012    EF 60-65%, Gr 1 DD, mild AI, mild bileaflet MVP, mod post directed MR, mild LAE, PASP 35.    Family History  Problem Relation Age of Onset  . Heart attack Father     in 48s  . Stroke Father 60  . Ovarian cancer Mother   . Coronary artery disease Brother   . Heart attack Son 22    Smoker  . Diabetes Neg Hx     Social History   Social History  . Marital Status: Married    Spouse Name: N/A  . Number of Children: 3  . Years of Education: HS grad   Occupational History  . [employer]- bus driver- retired    Social History Main Topics  . Smoking status: Never Smoker   .  Smokeless tobacco: Never Used  . Alcohol Use: No  . Drug Use: No  . Sexual Activity: No   Other Topics Concern  . Not on file   Social History Narrative   Widow, has 3 children, 4 grandchildren.   Orig from [place].   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   Caffiene, 1 cup daily avg.   No tob, no alc, no drugs.   Exercise: walking, limited by bilat hip pain.    Outpatient Prescriptions Prior to Visit  Medication Sig Dispense Refill  . aspirin 81 MG tablet Take 81 mg by mouth daily.      . B Complex-C (B-COMPLEX WITH VITAMIN C) tablet Take 1 tablet by mouth daily.    . Biotin 5000 MCG CAPS Take 5,000 mcg by mouth daily.    . Calcium Carbonate (CALCIUM 600 PO) Take 600 mg by mouth 2 (two) times daily.     . cholecalciferol (VITAMIN D) 1000 UNITS tablet Take 5,000 Units by mouth daily.     . cyanocobalamin (,VITAMIN B-12,) 1000 MCG/ML injection Inject 1,000 mcg into the muscle every 30 (thirty) days.    . fluticasone (FLONASE) 50 MCG/ACT nasal spray Place 2 sprays into both nostrils daily. 16 g  6  . gabapentin (NEURONTIN) 300 MG capsule TAKE 1 CAPSULE BY MOUTH AT LUNCH, 1 AT DINNER, AND 2 AT BEDTIME 360 capsule 3  . magnesium gluconate (MAGONATE) 30 MG tablet Take 1 tablet (30 mg total) by mouth 2 (two) times daily. 90 tablet 5  . metoprolol tartrate (LOPRESSOR) 25 MG tablet Take 1 tablet (25 mg total) by mouth 2 (two) times daily. 90 tablet 3  . nitroGLYCERIN (NITROSTAT) 0.4 MG SL tablet Place 1 tablet (0.4 mg total) under the tongue every 5 (five) minutes as needed for chest pain. 25 tablet 5  . Omega-3 Fatty Acids (FISH OIL) 1200 MG CAPS Take 1,000 mg by mouth 2 (two) times daily.     . pravastatin (PRAVACHOL) 20 MG tablet Take 20 mg by mouth 3 (three) times a week. Take on Monday, Wednesday, and friday    . zolpidem (AMBIEN) 10 MG tablet Take 10 mg by mouth at bedtime as needed for sleep. Reported on 09/24/2015     No facility-administered medications prior to visit.    Allergies  Allergen Reactions  . Sulfa Antibiotics Swelling    face    ROS Review of Systems  Constitutional: Negative for fever, chills, appetite change and fatigue.  HENT: Negative for congestion, dental problem, ear pain and sore throat.        Recently got over a case of laryngitis 1-2 weeks ago.  Eyes: Negative for discharge, redness and visual disturbance.  Respiratory: Negative for cough, chest tightness, shortness of breath and wheezing.   Cardiovascular: Negative for chest pain, palpitations and leg swelling.  Gastrointestinal: Negative for nausea, vomiting, abdominal pain, diarrhea and blood in stool.  Genitourinary: Negative for dysuria, urgency, frequency, hematuria, flank pain and difficulty urinating.  Musculoskeletal: Negative for myalgias, back pain, joint swelling, arthralgias and neck stiffness.  Skin: Negative for pallor and rash.  Neurological: Negative for dizziness, speech difficulty, weakness and headaches.  Hematological: Negative for adenopathy. Does not bruise/bleed easily.   Psychiatric/Behavioral: Negative for confusion and sleep disturbance. The patient is not nervous/anxious.     PE; Blood pressure 142/63, pulse 53, temperature 97.9 F (36.6 C), temperature source Oral, resp. rate 16, height 5\' 2"  (1.575 m), weight 142 lb 8 oz (  64.638 kg), SpO2 94 %. Gen: Alert, well appearing.  Patient is oriented to person, place, time, and situation. AFFECT: pleasant, lucid thought and speech. ENT: Ears: EACs clear, normal epithelium.  TMs with good light reflex and landmarks bilaterally.  Eyes: no injection, icteris, swelling, or exudate.  EOMI, PERRLA. Nose: no drainage or turbinate edema/swelling.  No injection or focal lesion.  Mouth: lips without lesion/swelling.  Oral mucosa pink and moist.  Dentition intact and without obvious caries or gingival swelling.  Oropharynx without erythema, exudate, or swelling.  Neck: supple/nontender.  No LAD, mass, or TM.  Carotid pulses 2+ bilaterally, without bruits. CV: RRR, [id number] systolic murmur heard best at RSB.  No  r/g.   LUNGS: CTA bilat, nonlabored resps, good aeration in all lung fields. ABD: soft, NT, ND, BS normal.  No hepatospenomegaly or mass.  No bruits. EXT: no clubbing, cyanosis, or edema.  Musculoskeletal: no joint swelling, erythema, warmth, or tenderness.  ROM of all joints intact. Skin - no sores or suspicious lesions or rashes or color changes  Pertinent labs:  Lab Results  Component Value Date   TSH 1.41 05/12/2014   Lab Results  Component Value Date   WBC 7.3 04/28/2014   HGB 13.7 04/28/2014   HCT 40.4 04/28/2014   MCV 89.3 04/28/2014   PLT 174.0 04/28/2014   Lab Results  Component Value Date   CREATININE 0.77 07/03/2015   BUN 16 07/03/2015   NA 139 07/03/2015   K 5.2* 07/03/2015   CL 105 07/03/2015   CO2 28 07/03/2015   Lab Results  Component Value Date   ALT 13 07/03/2015   AST 16 07/03/2015   ALKPHOS 66 07/03/2015   BILITOT 0.7 07/03/2015   Lab Results  Component Value Date   CHOL  220* 07/03/2015   Lab Results  Component Value Date   HDL 92.40 07/03/2015   Lab Results  Component Value Date   LDLCALC 109* 07/03/2015   Lab Results  Component Value Date   TRIG 94.0 07/03/2015   Lab Results  Component Value Date   CHOLHDL 2 07/03/2015   ASSESSMENT AND PLAN:   Preoperative clearance:  She is doing well and is clear for upcoming TKA surgery with Dr. Alvan Dame. Chronic med issues, particularly HTN and hyperlipidemia, are well controlled. She has remote hx of low TSH (repeat TSH a couple weeks later was normal) so I'll repeat this today.  Additionally, last K was borderline high.  Will recheck BMET today. It has been since 04/2014 since she had a CBC, so I also ordered this today. Will f/u results of echo ordered by Dr. Martinique.  An After Visit Summary was printed and given to the patient.  FOLLOW UP:  Return in about 6 months (around 03/23/2016) for routine chronic illness f/u.

## 2015-09-24 NOTE — Progress Notes (Signed)
Pre visit review using our clinic review tool, if applicable. No additional management support is needed unless otherwise documented below in the visit note. 

## 2015-09-27 ENCOUNTER — Ambulatory Visit (HOSPITAL_COMMUNITY): Payer: Medicare Other | Attending: Cardiovascular Disease

## 2015-09-27 ENCOUNTER — Encounter (HOSPITAL_COMMUNITY): Payer: Self-pay

## 2015-09-27 ENCOUNTER — Other Ambulatory Visit: Payer: Self-pay

## 2015-09-27 DIAGNOSIS — I119 Hypertensive heart disease without heart failure: Secondary | ICD-10-CM | POA: Insufficient documentation

## 2015-09-27 DIAGNOSIS — I340 Nonrheumatic mitral (valve) insufficiency: Secondary | ICD-10-CM | POA: Insufficient documentation

## 2015-09-27 DIAGNOSIS — I10 Essential (primary) hypertension: Secondary | ICD-10-CM | POA: Diagnosis not present

## 2015-09-27 DIAGNOSIS — I341 Nonrheumatic mitral (valve) prolapse: Secondary | ICD-10-CM | POA: Diagnosis not present

## 2015-09-27 DIAGNOSIS — E785 Hyperlipidemia, unspecified: Secondary | ICD-10-CM | POA: Diagnosis not present

## 2015-09-27 NOTE — Progress Notes (Signed)
Cassie White presented for echocardiogram referred by Dr. Martinique. Her heart rate was noted to be between 100 and 150 bpm throughout the exam compared to 60 bpm when she visited Dr. Martinique on September 17, 2015. DOD (Dr. Mare Ferrari) was notified and advised her to come back tomorrow at 8:45am for cardiac workup. Cassie White was able to be discharged home and was advised to keep taking her aspirin.   Wyatt Mage, [place] 09/27/2015

## 2015-09-28 ENCOUNTER — Encounter: Payer: Self-pay | Admitting: Cardiology

## 2015-09-28 ENCOUNTER — Ambulatory Visit (INDEPENDENT_AMBULATORY_CARE_PROVIDER_SITE_OTHER): Payer: Medicare Other | Admitting: Cardiology

## 2015-09-28 VITALS — BP 130/70 | HR 56 | Ht 63.0 in | Wt 143.0 lb

## 2015-09-28 DIAGNOSIS — I10 Essential (primary) hypertension: Secondary | ICD-10-CM | POA: Diagnosis not present

## 2015-09-28 DIAGNOSIS — I480 Paroxysmal atrial fibrillation: Secondary | ICD-10-CM | POA: Diagnosis not present

## 2015-09-28 DIAGNOSIS — I341 Nonrheumatic mitral (valve) prolapse: Secondary | ICD-10-CM

## 2015-09-28 MED ORDER — APIXABAN 5 MG PO TABS: 5.0000 mg | ORAL_TABLET | Freq: Two times a day (BID) | ORAL | Status: DC

## 2015-09-28 NOTE — Patient Instructions (Addendum)
Medication Instructions:  INCREASE YOUR LOPRESSOR (METOPROLOL) TO 25 MG TWICE A DAY  START ELIQUIS 5 MG TWICE A DAY  STOP ASPIRIN   Labwork: NONE  Testing/Procedures: NONE  Follow-Up: Your physician recommends that you schedule a follow-up appointment in: 2 [place] G NP OR SCOTT W PA   HOLD YOUR ELIQUIS 2 DAYS PRIOR TO YOUR UPCOMING SURGERY   If you need a refill on your cardiac medications before your next appointment, please call your pharmacy.

## 2015-09-28 NOTE — Progress Notes (Signed)
Cardiology Office Note   Date:  09/28/2015   ID:  KENNEDE White, DOB Feb 03, 1941, MRN [id number]  PCP:  Tammi Sou, MD  Cardiologist: Darlin Coco MD  Chief Complaint  Patient presents with  . New Evaluation      History of Present Illness: Cassie White is a 75 y.o. female who presents for [place] cardiology visit.  The patient is a cardiology patient of Dr. Peter Martinique.  She is a medical patient of Dr. Anitra Lauth.  She has a past history of essential hypertension, mitral valve prolapse, and hyperlipidemia.  She does not have any history of ischemic heart disease.  She saw Dr. Martinique recently and was complaining of palpitations.  A two-dimensional echocardiogram was ordered and she had her echo yesterday.  During the echo the patient was noted to be in atrial fibrillation with rapid ventricular response.  Because of this, follow-up urgent work in was arranged for today.  Today she is back in normal sinus rhythm.  I reviewed her echocardiogram from yesterday to confirm that indeed her rhythm had been atrial flutter fibrillation.  She had heart rates up to the 130s during the exam.  She states that yesterday she really wasn't all that aware of her heart rate or of the presence of palpitations.  Sometimes however she is aware of her heart skipping and she had mentioned that to Dr. Martinique when he recently saw her.  The patient does not have any history of exertional chest pain but she is relatively sedentary.  She does not get much exercise because of chronic back problems.  In terms of consideration for anticoagulation, the patient denies any problems with easy bruising or with hematochezia or hematuria etc.  She has never had any TIA or stroke symptoms.  Her echocardiogram yesterday showed good left ventricular systolic function and mitral valve prolapse with mild mitral regurgitation.  The patient has osteoarthritis and is scheduled for knee replacement at the end of March    Past  Medical History  Diagnosis Date  . Arthritis     DJD, back and both hips  . Hyperlipidemia   . GERD (gastroesophageal reflux disease)   . Osteoporosis, senile     stable at the left hip 2014-2015.  Took fosamax for 10 yrs.  Plan: get repeat DEXA q55mo.  . Diverticulosis     on colonoscopy 2004  . Scoliosis   . Peripheral neuropathy ([place]) 2008    Idiopathic vs familial (motor>sensory): gabapentin helpful  . Hx of cardiovascular stress test     Lexiscan Myoview (2/14):  Low risk, small ant defect likely breast attenuation, no ischemia, EF 69% (no change from 2010).    . Mitral regurgitation     Echo (2/14):  EF 60-65%, Gr 1 DD, mild AI, mild bileaflet MVP, mod post directed MR, mild LAE, PASP 35.  Marland Kitchen MVP (mitral valve prolapse)   . Shoulder pain left    to have surgery 10/02/14 for rotator cuff repair    Past Surgical History  Procedure Laterality Date  . Tonsillectomy    . Vaginal hysterectomy      For Uterine Deviation   . 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 Cholecystectomy  03/2010    Dr.Gross  . Lumpectomy  06/2009    "Fatty Necrosis"  . Breast surgery  Nov 2010    Benign biopsy, right  . Cystocele repair    . Combined hysterectomy vaginal / oophorectomy /  a&p repair  0000000    uncertain if ovaries were removed or not  . Colonoscopy  10/03/2002    No polyps.  Repeat 10 yrs (Dr. Olevia Perches)  . Pft's  2014    NORMAL  . Rotator cuff repair Left 10/02/14    Dr. Veverly Fells  . Transthoracic echocardiogram  09/2012    EF 60-65%, Gr 1 DD, mild AI, mild bileaflet MVP, mod post directed MR, mild LAE, PASP 35.     Current Outpatient Prescriptions  Medication Sig Dispense Refill  . B Complex-C (B-COMPLEX WITH VITAMIN C) tablet Take 1 tablet by mouth daily.    . Biotin 5000 MCG CAPS Take 5,000 mcg by mouth daily.    . Calcium Carbonate (CALCIUM 600 PO) Take 600 mg by mouth 2 (two) times daily.     . cholecalciferol (VITAMIN D) 1000 UNITS tablet Take 5,000 Units by mouth  daily.     . cyanocobalamin (,VITAMIN B-12,) 1000 MCG/ML injection Inject 1,000 mcg into the muscle every 30 (thirty) days.    . fluticasone (FLONASE) 50 MCG/ACT nasal spray Place 2 sprays into both nostrils daily. 16 g 6  . gabapentin (NEURONTIN) 300 MG capsule TAKE 1 CAPSULE BY MOUTH AT LUNCH, 1 AT DINNER, AND 2 AT BEDTIME 360 capsule 3  . magnesium gluconate (MAGONATE) 30 MG tablet Take 1 tablet (30 mg total) by mouth 2 (two) times daily. 90 tablet 5  . metoprolol tartrate (LOPRESSOR) 25 MG tablet Take 1 tablet (25 mg total) by mouth 2 (two) times daily. 90 tablet 3  . nitroGLYCERIN (NITROSTAT) 0.4 MG SL tablet Place 1 tablet (0.4 mg total) under the tongue every 5 (five) minutes as needed for chest pain. 25 tablet 5  . Omega-3 Fatty Acids (FISH OIL) 1200 MG CAPS Take 1,000 mg by mouth 2 (two) times daily.     . pravastatin (PRAVACHOL) 20 MG tablet Take 20 mg by mouth 3 (three) times a week. Take on Monday, Wednesday, and friday    . apixaban (ELIQUIS) 5 MG TABS tablet Take 1 tablet (5 mg total) by mouth 2 (two) times daily. 60 tablet 3   No current facility-administered medications for this visit.    Allergies:   Sulfa antibiotics    Social History:  The patient  reports that she has never smoked. She has never used smokeless tobacco. She reports that she does not drink alcohol or use illicit drugs.   Family History:  The patient's family history includes Coronary artery disease in her brother; Heart attack in her father; Heart attack (age of onset: 76) in her son; Ovarian cancer in her mother; Stroke (age of onset: 56) in her father. There is no history of Diabetes.    ROS:  Please see the history of present illness.   Otherwise, review of systems are positive for none.   All other systems are reviewed and negative.    PHYSICAL EXAM: VS:  BP 130/70 mmHg  Pulse 56  Ht 5\' 3"  (1.6 m)  Wt 143 lb (64.864 kg)  BMI 25.34 kg/m2 , BMI Body mass index is 25.34 kg/(m^2). GEN: Well nourished,  well developed, in no acute distress HEENT: normal Neck: no JVD, carotid bruits, or masses Cardiac: RRR; soft apical murmur, no, rubs, or gallops,no edema  Respiratory:  clear to auscultation bilaterally, normal work of breathing GI: soft, nontender, nondistended, + BS MS: no deformity or atrophy Skin: warm and dry, no rash Neuro:  Strength and sensation are intact Psych: euthymic mood, full  affect   EKG:  EKG is ordered today. The ekg ordered today demonstrates sinus bradycardia at 59 bpm.  First-degree AV block with a PR of 234 seconds.  No ischemic changes.   Recent Labs: 07/03/2015: ALT 13 09/24/2015: BUN 10; Creatinine, Ser 0.74; Hemoglobin 13.2; Platelets 174.0; Potassium 4.3; Sodium 143; TSH 1.63    Lipid Panel    Component Value Date/Time   CHOL 220* 07/03/2015 0813   CHOL 211* 04/28/2014 1026   TRIG 94.0 07/03/2015 0813   TRIG 95 04/28/2014 1026   HDL 92.40 07/03/2015 0813   HDL 101 04/28/2014 1026   CHOLHDL 2 07/03/2015 0813   VLDL 18.8 07/03/2015 0813   LDLCALC 109* 07/03/2015 0813   LDLCALC 91 04/28/2014 1026   LDLDIRECT 87.5 09/07/2012 1022      Wt Readings from Last 3 Encounters:  09/28/15 143 lb (64.864 kg)  09/24/15 142 lb 8 oz (64.638 kg)  09/17/15 141 lb 7 oz (64.156 kg)         ASSESSMENT AND PLAN:  1.  Paroxysmal atrial flutter fibrillation documented during echocardiogram yesterday.  Today she is back in normal sinus rhythm.This patients CHA2DS2-VASc Score is 3, for age, female sex, and hypertension.   Above score calculated as 1 point each if present [CHF, HTN, DM, Vascular=MI/PAD/Aortic Plaque, Age if 65-74, or Female] Above score calculated as 2 points each if present [Age > 75, or Stroke/TIA/TE] 2.  Mild mitral valve prolapse 3.  Osteoarthritis   Current medicines are reviewed at length with the patient today.  The patient has concerns regarding medicines.  The following changes have been made:  We are stopping baby aspirin and we are  starting Apixaban 5 mg twice a day for her paroxysmal atrial fibrillation.  To decrease the frequency of her symptomatic palpitations we will increase her from 12.5 mg twice a day up to 25 mg twice a day of Lopressor.  Labs/ tests ordered today include:   Orders Placed This Encounter  Procedures  . EKG 12-Lead     Disposition:   Follow-up with Dr. Martinique or NP/PA in about 2 weeks for office visit and EKG.  She is cleared for knee surgery.  She will stop her Apixaban 2 days prior to surgery.  Berna Spare MD 09/28/2015 9:54 AM    [address], [place], [place]  [address] Phone: [phone]; Fax: [phone]

## 2015-10-01 ENCOUNTER — Other Ambulatory Visit: Payer: Self-pay | Admitting: *Deleted

## 2015-10-01 MED ORDER — CYANOCOBALAMIN 1000 MCG/ML IJ SOLN: 1000.0000 ug | INTRAMUSCULAR | Status: DC

## 2015-10-01 NOTE — Telephone Encounter (Signed)
Pt called requesting refill be sent to [facility].  RF request for Vitamin B12  LOV: 09/24/15 Next ov: 03/26/16 Last written: unknown  Please advise. Thanks.

## 2015-10-03 ENCOUNTER — Telehealth: Payer: Self-pay

## 2015-10-03 ENCOUNTER — Other Ambulatory Visit: Payer: Self-pay | Admitting: Neurology

## 2015-10-03 NOTE — Telephone Encounter (Signed)
Prior auth for Eliquis 5 mg sent to [facility]. 

## 2015-10-03 NOTE — Telephone Encounter (Signed)
Eliquis approved through 08/03/2016. PA- [id number].

## 2015-10-11 NOTE — Progress Notes (Signed)
Cardiology Office Note:    Date:  10/12/2015   ID:  Cassie White, DOB 11/08/1940, MRN [id number]  PCP:  Tammi Sou, MD  Cardiologist:  Dr. Peter Martinique   Electrophysiologist:  n/a  Chief Complaint  Patient presents with  . Atrial Fibrillation    follow up    History of Present Illness:     Cassie White is a 75 y.o. female with a hx of MVP, mitral regurgitation, HL, GERD. She was evaluated by Dr. Peter Martinique in 09/2012 for exertional chest tightness. She previously had a myoview in 2010 with anterior breast attenuation, o/w normal. She underwent stress testing with a Lexiscan Myoview (2/14): Low risk, small ant defect likely breast attenuation, no ischemia, EF 69% (no change from 2010). Echo (2/14): EF 60-65%, Gr 1 DD, mild AI, mild bileaflet MVP, mod post directed MR, mild LAE, PASP 35.   Last seen by Dr. Martinique 09/17/15 for surgical clearance for upcoming knee surgery. It was felt that she could proceed with the surgery at acceptable risk. Follow-up echocardiogram was arranged. This demonstrated anterior leaflet mitral valve prolapse with mild mitral regurgitation. Patient was noted to be in atrial fibrillation during the study. She was set up to see Dr. Mare Ferrari 09/28/15. She was back in NSR. Upon further review, when in atrial fibrillation, heart rates were in the 130s.    CHADS2-VASc=3 (age, female, HTN).  She was placed on Apixaban 5 bid.  Metoprolol Tartrate dose was adjusted.  She returns for FU.  She is doing well. The patient denies chest pain, shortness of breath, syncope, orthopnea, PND or significant pedal edema. Denies palpitations.     Past Medical History  Diagnosis Date  . Arthritis     DJD, back and both hips  . Hyperlipidemia   . GERD (gastroesophageal reflux disease)   . Osteoporosis, senile     stable at the left hip 2014-2015.  Took fosamax for 10 yrs.  Plan: get repeat DEXA q30mo.  . Diverticulosis     on colonoscopy 2004  . Scoliosis   .  Peripheral neuropathy ([place]) 2008    Idiopathic vs familial (motor>sensory): gabapentin helpful  . Hx of cardiovascular stress test     Lexiscan Myoview (2/14):  Low risk, small ant defect likely breast attenuation, no ischemia, EF 69% (no change from 2010).    . Mitral regurgitation     Echo (2/14):  EF 60-65%, Gr 1 DD, mild AI, mild bileaflet MVP, mod post directed MR, mild LAE, PASP 35.  Marland Kitchen MVP (mitral valve prolapse)   . Shoulder pain left    to have surgery 10/02/14 for rotator cuff repair    Past Surgical History  Procedure Laterality Date  . Tonsillectomy    . Vaginal hysterectomy      For Uterine Deviation   . 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 Cholecystectomy  03/2010    Dr.Gross  . Lumpectomy  06/2009    "Fatty Necrosis"  . Breast surgery  Nov 2010    Benign biopsy, right  . Cystocele repair    . Combined hysterectomy vaginal / oophorectomy / a&p repair  0000000    uncertain if ovaries were removed or not  . Colonoscopy  10/03/2002    No polyps.  Repeat 10 yrs (Dr. Olevia Perches)  . Pft's  2014    NORMAL  . Rotator cuff repair Left 10/02/14    Dr. Veverly Fells  . Transthoracic echocardiogram  09/2012  EF 60-65%, Gr 1 DD, mild AI, mild bileaflet MVP, mod post directed MR, mild LAE, PASP 35.    Current Medications: Outpatient Prescriptions Prior to Visit  Medication Sig Dispense Refill  . apixaban (ELIQUIS) 5 MG TABS tablet Take 1 tablet (5 mg total) by mouth 2 (two) times daily. 60 tablet 3  . B Complex-C (B-COMPLEX WITH VITAMIN C) tablet Take 1 tablet by mouth daily.    . Biotin 5000 MCG CAPS Take 5,000 mcg by mouth daily.    . Calcium Carbonate (CALCIUM 600 PO) Take 600 mg by mouth 2 (two) times daily.     . cholecalciferol (VITAMIN D) 1000 UNITS tablet Take 5,000 Units by mouth daily.     . cyanocobalamin (,VITAMIN B-12,) 1000 MCG/ML injection Inject 1 mL (1,000 mcg total) into the muscle every 30 (thirty) days. 10 mL 1  . gabapentin (NEURONTIN) 300 MG capsule  TAKE 1 CAPSULE BY MOUTH AT LUNCH, 1 AT DINNER, AND 2 AT BEDTIME 360 capsule 3  . metoprolol tartrate (LOPRESSOR) 25 MG tablet Take 1 tablet (25 mg total) by mouth 2 (two) times daily. 90 tablet 3  . nitroGLYCERIN (NITROSTAT) 0.4 MG SL tablet Place 1 tablet (0.4 mg total) under the tongue every 5 (five) minutes as needed for chest pain. 25 tablet 5  . Omega-3 Fatty Acids (FISH OIL) 1200 MG CAPS Take 1,000 mg by mouth 2 (two) times daily.     . pravastatin (PRAVACHOL) 20 MG tablet Take 20 mg by mouth 3 (three) times a week. Take on Monday, Wednesday, and friday    . fluticasone (FLONASE) 50 MCG/ACT nasal spray Place 2 sprays into both nostrils daily. (Patient not taking: Reported on 10/12/2015) 16 g 6  . magnesium gluconate (MAGONATE) 30 MG tablet Take 1 tablet (30 mg total) by mouth 2 (two) times daily. (Patient not taking: Reported on 10/12/2015) 90 tablet 5   No facility-administered medications prior to visit.     Allergies:   Sulfa antibiotics   Social History   Social History  . Marital Status: Married    Spouse Name: N/A  . Number of Children: 3  . Years of Education: HS grad   Occupational History  . [employer]- bus driver- retired    Social History Main Topics  . Smoking status: Never Smoker   . Smokeless tobacco: Never Used  . Alcohol Use: No  . Drug Use: No  . Sexual Activity: No   Other Topics Concern  . None   Social History Narrative   Widow, has 3 children, 4 grandchildren.   Orig from [place].   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   Caffiene, 1 cup daily avg.   No tob, no alc, no drugs.   Exercise: walking, limited by bilat hip pain.     Family History:  The patient's family history includes Coronary artery disease in her brother; Heart attack in her father; Heart attack (age of onset: 49) in her son; Ovarian cancer in her mother; Stroke (age of onset: 57) in her father. There is no history of Diabetes.   ROS:   Please  see the history of present illness.    Review of Systems  Cardiovascular: Positive for irregular heartbeat.  Hematologic/Lymphatic: Bruises/bleeds easily.  Musculoskeletal: Positive for back pain and joint pain.  All other systems reviewed and are negative.   Physical Exam:    VS:  BP 118/54 mmHg  Pulse 103  Ht 5\' 3"  (1.6 m)  Wt 143 lb 1.9 oz (64.919 kg)  BMI 25.36 kg/m2   GEN: Well nourished, well developed, in no acute distress HEENT: normal Neck: no JVD, no masses Cardiac: Normal S1/S2, irreg irreg rhythm; no murmurs,  no edema    Respiratory:  clear to auscultation bilaterally; no wheezing, rhonchi or rales GI: soft, nontender, nondistended MS: no deformity or atrophy Skin: warm and dry Neuro: No focal deficits  Psych: Alert and oriented x 3, normal affect  Wt Readings from Last 3 Encounters:  10/12/15 143 lb 1.9 oz (64.919 kg)  09/28/15 143 lb (64.864 kg)  09/24/15 142 lb 8 oz (64.638 kg)      Studies/Labs Reviewed:     EKG:  EKG is  ordered today.  The ekg ordered today demonstrates AFib, HR 147  Recent Labs: 07/03/2015: ALT 13 09/24/2015: BUN 10; Creatinine, Ser 0.74; Hemoglobin 13.2; Platelets 174.0; Potassium 4.3; Sodium 143; TSH 1.63   Recent Lipid Panel    Component Value Date/Time   CHOL 220* 07/03/2015 0813   CHOL 211* 04/28/2014 1026   TRIG 94.0 07/03/2015 0813   TRIG 95 04/28/2014 1026   HDL 92.40 07/03/2015 0813   HDL 101 04/28/2014 1026   CHOLHDL 2 07/03/2015 0813   VLDL 18.8 07/03/2015 0813   LDLCALC 109* 07/03/2015 0813   LDLCALC 91 04/28/2014 1026   LDLDIRECT 87.5 09/07/2012 1022    Additional studies/ records that were reviewed today include:   Echo 09/27/15 Mild LVH, EF 55-60%, mild AI, mild anterior MVP, mild MR, PASP 36 mmHg  Myoview 2/14 Low risk stress nuclear study. There is a small fixed anterior defect that is likely to be breast attenuation. This is similar to her previous study. There is no ischemia. LV Ejection Fraction:  69%.   ASSESSMENT:     1. Paroxysmal atrial fibrillation with rapid ventricular response (HCC)   2. Essential hypertension   3. Mitral valve prolapse   4. Hyperlipidemia     PLAN:     In order of problems listed above:  1. AF with RVR - She is back in AFib today with RVR. She is completely asymptomatic. I reviewed her case with Dr. Acie Fredrickson (DOD).  She has been adherent with her medications.    -  Continue Apixaban, Metoprolol Tartrate  -  Start Flecainide 50 mg bid  -  FU with me in 3 days to recheck ECG. Consider increasing Flecainide to 75 or 100 mg bid  -  If back in NSR, plan ETT-Myoview to r/o pro-arrhythmia and ischemic heart disease  -  Plan to postpone TKR for 4-6 weeks to ensure she gets full month of anticoagulation after starting Flecainide.    -  She knows to go to the ED if symptomatic this weekend.   2. HTN - Controlled.   3. MVP - Mild MR by recent Echo.     4. HL - Continue Pravastatin.  LDL 11/16 was 109.3.   5. DJD - I have advised her to postpone her knee surgery for 4-6 weeks. She should probably have a full month of anticoagulation after return to NSR with Flecainide.    Medication Adjustments/Labs and Tests Ordered: Current medicines are reviewed at length with the patient today.  Concerns regarding medicines are outlined above.  Medication changes, Labs and Tests ordered today are outlined in the Patient Instructions noted below. Patient Instructions  Medication Instructions:  1. START FLECAINIDE 50 MG 1 TABLET TWICE DAILY; MAKE SURE TO SPACE OUT 12 APART  Labwork:  NONE  Testing/Procedures: NONE  Follow-Up: 10/15/15 @ 10:50 WITH SCOTT WEAVER, PAC   Any Other Special Instructions Will Be Listed Below (If Applicable). YOU WILL NEED TO CALL YOUR SURGEON AND POSTPONE YOUR UPCOMING KNEE SURGERY FOR 4-6 WEEKS PER SCOTT WEAVER, PAC   If you need a refill on your cardiac medications before your next appointment, please call your  pharmacy.   Signed, Richardson Dopp, PA-C  10/12/2015 3:16 PM    [facility] [place], [place], [place]  [address] Phone: [phone]; Fax: [phone]

## 2015-10-12 ENCOUNTER — Ambulatory Visit (INDEPENDENT_AMBULATORY_CARE_PROVIDER_SITE_OTHER): Payer: Medicare Other | Admitting: Physician Assistant

## 2015-10-12 ENCOUNTER — Encounter: Payer: Self-pay | Admitting: Physician Assistant

## 2015-10-12 VITALS — BP 118/54 | HR 103 | Ht 63.0 in | Wt 143.1 lb

## 2015-10-12 DIAGNOSIS — I480 Paroxysmal atrial fibrillation: Secondary | ICD-10-CM

## 2015-10-12 DIAGNOSIS — I341 Nonrheumatic mitral (valve) prolapse: Secondary | ICD-10-CM | POA: Diagnosis not present

## 2015-10-12 DIAGNOSIS — I10 Essential (primary) hypertension: Secondary | ICD-10-CM

## 2015-10-12 DIAGNOSIS — E785 Hyperlipidemia, unspecified: Secondary | ICD-10-CM

## 2015-10-12 MED ORDER — FLECAINIDE ACETATE 50 MG PO TABS: 50.0000 mg | ORAL_TABLET | Freq: Two times a day (BID) | ORAL | Status: DC

## 2015-10-12 NOTE — Patient Instructions (Addendum)
Medication Instructions:  1. START FLECAINIDE 50 MG 1 TABLET TWICE DAILY; MAKE SURE TO SPACE OUT 12 APART  Labwork: NONE  Testing/Procedures: NONE  Follow-Up: 10/15/15 @ 10:50 WITH SCOTT WEAVER, PAC   Any Other Special Instructions Will Be Listed Below (If Applicable). YOU WILL NEED TO CALL YOUR SURGEON AND POSTPONE YOUR UPCOMING KNEE SURGERY FOR 4-6 WEEKS PER SCOTT WEAVER, PAC   If you need a refill on your cardiac medications before your next appointment, please call your pharmacy.

## 2015-10-14 NOTE — Progress Notes (Signed)
Cardiology Office Note:    Date:  10/15/2015   ID:  Cassie White, DOB 1940-09-10, MRN [id number]  PCP:  Tammi Sou, MD  Cardiologist:  Dr. Peter Martinique   Electrophysiologist:  n/a  Chief Complaint  Patient presents with  . Atrial Fibrillation    ECG - Follow up    History of Present Illness:     Cassie White is a 75 y.o. female with a hx of MVP, mitral regurgitation, HL, GERD. She was evaluated by Dr. Peter Martinique in 09/2012 for exertional chest tightness. She previously had a myoview in 2010 with anterior breast attenuation, o/w normal. She underwent stress testing with a Lexiscan Myoview (2/14): Low risk, small ant defect likely breast attenuation, no ischemia, EF 69% (no change from 2010). Echo (2/14): EF 60-65%, Gr 1 DD, mild AI, mild bileaflet MVP, mod post directed MR, mild LAE, PASP 35.   Last seen by Dr. Martinique 09/17/15 for surgical clearance for upcoming knee surgery. It was felt that she could proceed with the surgery at acceptable risk. Follow-up echocardiogram was arranged. This demonstrated anterior leaflet mitral valve prolapse with mild mitral regurgitation. Patient was noted to be in atrial fibrillation during the study. She was set up to see Dr. Mare Ferrari 09/28/15. She was back in NSR. Upon further review, when in atrial fibrillation, heart rates were in the 130s.  CHADS2-VASc=3 (age, female, HTN).  She was placed on Apixaban 5 bid.  Metoprolol Tartrate dose was adjusted.    I saw her last week in follow-up. She was in atrial fibrillation with RVR. I placed her on flecainide 50 mg twice a day. She is brought back today for close follow-up. Today, she has back in sinus rhythm.  She is doing well. The patient denies chest pain, shortness of breath, syncope, orthopnea, PND or significant pedal edema. Denies palpitations. She does have some issues with her balance at times. She denies any significant falls.   Past Medical History  Diagnosis Date  . Arthritis       DJD, back and both hips  . Hyperlipidemia   . GERD (gastroesophageal reflux disease)   . Osteoporosis, senile     stable at the left hip 2014-2015.  Took fosamax for 10 yrs.  Plan: get repeat DEXA q31mo.  . Diverticulosis     on colonoscopy 2004  . Scoliosis   . Peripheral neuropathy ([place]) 2008    Idiopathic vs familial (motor>sensory): gabapentin helpful  . Hx of cardiovascular stress test     Lexiscan Myoview (2/14):  Low risk, small ant defect likely breast attenuation, no ischemia, EF 69% (no change from 2010).    . Mitral regurgitation     Echo (2/14):  EF 60-65%, Gr 1 DD, mild AI, mild bileaflet MVP, mod post directed MR, mild LAE, PASP 35.  Marland Kitchen MVP (mitral valve prolapse)   . Shoulder pain left    to have surgery 10/02/14 for rotator cuff repair    Past Surgical History  Procedure Laterality Date  . Tonsillectomy    . Vaginal hysterectomy      For Uterine Deviation   . 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 Cholecystectomy  03/2010    Dr.Gross  . Lumpectomy  06/2009    "Fatty Necrosis"  . Breast surgery  Nov 2010    Benign biopsy, right  . Cystocele repair    . Combined hysterectomy vaginal / oophorectomy / a&p repair  1990  uncertain if ovaries were removed or not  . Colonoscopy  10/03/2002    No polyps.  Repeat 10 yrs (Dr. Olevia Perches)  . Pft's  2014    NORMAL  . Rotator cuff repair Left 10/02/14    Dr. Veverly Fells  . Transthoracic echocardiogram  09/2012    EF 60-65%, Gr 1 DD, mild AI, mild bileaflet MVP, mod post directed MR, mild LAE, PASP 35.    Current Medications: Outpatient Prescriptions Prior to Visit  Medication Sig Dispense Refill  . apixaban (ELIQUIS) 5 MG TABS tablet Take 1 tablet (5 mg total) by mouth 2 (two) times daily. 60 tablet 3  . B Complex-C (B-COMPLEX WITH VITAMIN C) tablet Take 1 tablet by mouth daily.    . Biotin 5000 MCG CAPS Take 5,000 mcg by mouth daily.    . Calcium Carbonate (CALCIUM 600 PO) Take 600 mg by mouth 2 (two) times  daily.     . cholecalciferol (VITAMIN D) 1000 UNITS tablet Take 5,000 Units by mouth daily.     . cyanocobalamin (,VITAMIN B-12,) 1000 MCG/ML injection Inject 1 mL (1,000 mcg total) into the muscle every 30 (thirty) days. 10 mL 1  . flecainide (TAMBOCOR) 50 MG tablet Take 1 tablet (50 mg total) by mouth 2 (two) times daily. 60 tablet 11  . fluticasone (FLONASE) 50 MCG/ACT nasal spray Place 2 sprays into both nostrils daily as needed for allergies or rhinitis.    Marland Kitchen gabapentin (NEURONTIN) 300 MG capsule TAKE 1 CAPSULE BY MOUTH AT LUNCH, 1 AT DINNER, AND 2 AT BEDTIME 360 capsule 3  . magnesium gluconate (MAGONATE) 30 MG tablet Take 30 mg by mouth 2 (two) times daily as needed (for allergies).    . nitroGLYCERIN (NITROSTAT) 0.4 MG SL tablet Place 1 tablet (0.4 mg total) under the tongue every 5 (five) minutes as needed for chest pain. 25 tablet 5  . Omega-3 Fatty Acids (FISH OIL) 1200 MG CAPS Take 1,000 mg by mouth 2 (two) times daily.     . pravastatin (PRAVACHOL) 20 MG tablet Take 20 mg by mouth 3 (three) times a week. Take on Monday, Wednesday, and friday    . metoprolol tartrate (LOPRESSOR) 25 MG tablet Take 1 tablet (25 mg total) by mouth 2 (two) times daily. 90 tablet 3   No facility-administered medications prior to visit.     Allergies:   Sulfa antibiotics   Social History   Social History  . Marital Status: Married    Spouse Name: N/A  . Number of Children: 3  . Years of Education: HS grad   Occupational History  . [employer]- bus driver- retired    Social History Main Topics  . Smoking status: Never Smoker   . Smokeless tobacco: Never Used  . Alcohol Use: No  . Drug Use: No  . Sexual Activity: No   Other Topics Concern  . None   Social History Narrative   Widow, has 3 children, 4 grandchildren.   Orig from [place].   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   Caffiene, 1 cup daily avg.   No tob, no alc, no drugs.   Exercise:  walking, limited by bilat hip pain.     Family History:  The patient's family history includes Coronary artery disease in her brother; Heart attack in her father; Heart attack (age of onset: 41) in her son; Ovarian cancer in her mother; Stroke (age of onset: 2) in her father. There is no history  of Diabetes.   ROS:   Please see the history of present illness.    Review of Systems  Cardiovascular: Positive for dyspnea on exertion and irregular heartbeat.  Hematologic/Lymphatic: Bruises/bleeds easily.  Musculoskeletal: Positive for back pain.  Neurological: Positive for dizziness.  All other systems reviewed and are negative.   Physical Exam:    VS:  BP 142/62 mmHg  Pulse 52  Ht 5\' 3"  (1.6 m)  Wt 145 lb 12.8 oz (66.134 kg)  BMI 25.83 kg/m2   GEN: Well nourished, well developed, in no acute distress HEENT: normal Neck: no JVD, no masses Cardiac: Normal S1/S2, RRR; no murmurs,  no edema    Respiratory:  clear to auscultation bilaterally; no wheezing, rhonchi or rales GI: soft, nontender, nondistended MS: no deformity or atrophy Skin: warm and dry Neuro: No focal deficits  Psych: Alert and oriented x 3, normal affect  Wt Readings from Last 3 Encounters:  10/15/15 145 lb 12.8 oz (66.134 kg)  10/12/15 143 lb 1.9 oz (64.919 kg)  09/28/15 143 lb (64.864 kg)      Studies/Labs Reviewed:     EKG:  EKG is  ordered today.  The ekg ordered today demonstrates AFib, HR 147  Recent Labs: 07/03/2015: ALT 13 09/24/2015: BUN 10; Creatinine, Ser 0.74; Hemoglobin 13.2; Platelets 174.0; Potassium 4.3; Sodium 143; TSH 1.63   Recent Lipid Panel    Component Value Date/Time   CHOL 220* 07/03/2015 0813   CHOL 211* 04/28/2014 1026   TRIG 94.0 07/03/2015 0813   TRIG 95 04/28/2014 1026   HDL 92.40 07/03/2015 0813   HDL 101 04/28/2014 1026   CHOLHDL 2 07/03/2015 0813   VLDL 18.8 07/03/2015 0813   LDLCALC 109* 07/03/2015 0813   LDLCALC 91 04/28/2014 1026   LDLDIRECT 87.5 09/07/2012 1022      Additional studies/ records that were reviewed today include:   Echo 09/27/15 Mild LVH, EF 55-60%, mild AI, mild anterior MVP, mild MR, PASP 36 mmHg  Myoview 2/14 Low risk stress nuclear study. There is a small fixed anterior defect that is likely to be breast attenuation. This is similar to her previous study. There is no ischemia. LV Ejection Fraction: 69%.   ASSESSMENT:     1. Atrial fibrillation, unspecified type ([place])   2. Essential hypertension     PLAN:     In order of problems listed above:  1. PAF - She is back in sinus rhythm today. Heart rate is somewhat slow. I will reduce her metoprolol tartrate back to 12.5 mg twice a day. QRS is stable. Continue flecainide 50 mg twice a day. Arrange ETT-Myoview to rule out proarrhythmia and ischemic heart disease. I suspect that she may proceed with her knee replacement in approximately 4-6 weeks.   2. HTN - Controlled.   3. MVP - Mild MR by recent Echo.     4. HL - Continue Pravastatin.  LDL 11/16 was 109.3.   5. DJD - I have advised her to postpone her knee surgery for 4-6 weeks. She should probably have a full month of anticoagulation after return to NSR with Flecainide.    Medication Adjustments/Labs and Tests Ordered: Current medicines are reviewed at length with the patient today.  Concerns regarding medicines are outlined above.  Medication changes, Labs and Tests ordered today are outlined in the Patient Instructions noted below. Patient Instructions  Medication Instructions:  1. DECREASE METOPROLOL TARTRATE TO 12.5 MG TWICE DAILY (THIS IS 1/2 TABLET TWICE DAILY)  Labwork: NONE  Testing/Procedures: Your physician has requested that you have en exercise stress myoview. For further information please visit [email or web address]. Please follow instruction sheet, as given. PER Shailynn Fong W. PAC; PT HAS KNEE PROBLEMS HOWEVER; HE SAID SHE WILL NEED TO BE EXERCISED SOME SINCE PT RECENTLY STARTED ON FLECAINIDE AND WILL NEED TO  R/O PRO-ARRHYTHMIA; THEN OK TO CHANGE TO LEXISCAN IF NEEDED  Follow-Up: DR. Martinique IN 2 MONTHS  Any Other Special Instructions Will Be Listed Below (If Applicable).  If you need a refill on your cardiac medications before your next appointment, please call your pharmacy.   Signed, Richardson Dopp, PA-C  10/15/2015 3:51 PM    [facility] [place], [place],   [address] Phone: [phone]; Fax: [phone]

## 2015-10-15 ENCOUNTER — Ambulatory Visit (INDEPENDENT_AMBULATORY_CARE_PROVIDER_SITE_OTHER): Payer: Medicare Other | Admitting: Physician Assistant

## 2015-10-15 ENCOUNTER — Encounter: Payer: Self-pay | Admitting: Physician Assistant

## 2015-10-15 VITALS — BP 142/62 | HR 52 | Ht 63.0 in | Wt 145.8 lb

## 2015-10-15 DIAGNOSIS — I10 Essential (primary) hypertension: Secondary | ICD-10-CM

## 2015-10-15 DIAGNOSIS — I4891 Unspecified atrial fibrillation: Secondary | ICD-10-CM

## 2015-10-15 MED ORDER — METOPROLOL TARTRATE 25 MG PO TABS: 12.5000 mg | ORAL_TABLET | Freq: Two times a day (BID) | ORAL | Status: DC

## 2015-10-15 NOTE — Patient Instructions (Addendum)
Medication Instructions:  1. DECREASE METOPROLOL TARTRATE TO 12.5 MG TWICE DAILY (THIS IS 1/2 TABLET TWICE DAILY)  Labwork: NONE  Testing/Procedures: Your physician has requested that you have en exercise stress myoview. For further information please visit [email or web address]. Please follow instruction sheet, as given. PER SCOTT W. PAC; PT HAS KNEE PROBLEMS HOWEVER; HE SAID SHE WILL NEED TO BE EXERCISED SOME SINCE PT RECENTLY STARTED ON FLECAINIDE AND WILL NEED TO R/O PRO-ARRHYTHMIA; THEN OK TO CHANGE TO LEXISCAN IF NEEDED  Follow-Up: DR. Martinique IN 2 MONTHS  Any Other Special Instructions Will Be Listed Below (If Applicable).  If you need a refill on your cardiac medications before your next appointment, please call your pharmacy.

## 2015-10-16 ENCOUNTER — Telehealth (HOSPITAL_COMMUNITY): Payer: Self-pay | Admitting: *Deleted

## 2015-10-16 NOTE — Telephone Encounter (Signed)
Patient given detailed instructions per Myocardial Perfusion Study Information Sheet for the test on 10/18/15 at 1000. Patient notified to arrive 15 minutes early and that it is imperative to arrive on time for appointment to keep from having the test rescheduled.  If you need to cancel or reschedule your appointment, please call the office within 24 hours of your appointment. Failure to do so may result in a cancellation of your appointment, and a $50 no show fee. Patient verbalized understanding.Tehillah Cipriani, Ranae Palms

## 2015-10-18 ENCOUNTER — Ambulatory Visit (HOSPITAL_COMMUNITY): Payer: Medicare Other | Attending: Cardiology

## 2015-10-18 DIAGNOSIS — R079 Chest pain, unspecified: Secondary | ICD-10-CM | POA: Diagnosis not present

## 2015-10-18 DIAGNOSIS — R9439 Abnormal result of other cardiovascular function study: Secondary | ICD-10-CM | POA: Diagnosis not present

## 2015-10-18 DIAGNOSIS — I4891 Unspecified atrial fibrillation: Secondary | ICD-10-CM | POA: Diagnosis not present

## 2015-10-18 DIAGNOSIS — I10 Essential (primary) hypertension: Secondary | ICD-10-CM | POA: Diagnosis not present

## 2015-10-18 LAB — MYOCARDIAL PERFUSION IMAGING
CHL CUP NUCLEAR SRS: 7
CHL CUP RESTING HR STRESS: 60 {beats}/min
CSEPPHR: 85 {beats}/min
LV dias vol: 106 mL (ref 46–106)
LVSYSVOL: 38 mL
NUC STRESS TID: 1.2
RATE: 0.32
SDS: 5
SSS: 12

## 2015-10-18 MED ORDER — REGADENOSON 0.4 MG/5ML IV SOLN
0.4000 mg | Freq: Once | INTRAVENOUS | Status: AC
  Administered 2015-10-18: 0.4 mg via INTRAVENOUS

## 2015-10-18 MED ORDER — TECHNETIUM TC 99M SESTAMIBI GENERIC - CARDIOLITE
10.2000 | Freq: Once | INTRAVENOUS | Status: AC | PRN
  Administered 2015-10-18: 10 via INTRAVENOUS

## 2015-10-18 MED ORDER — TECHNETIUM TC 99M SESTAMIBI GENERIC - CARDIOLITE
32.0000 | Freq: Once | INTRAVENOUS | Status: AC | PRN
  Administered 2015-10-18: 32 via INTRAVENOUS

## 2015-10-23 ENCOUNTER — Telehealth: Payer: Self-pay | Admitting: *Deleted

## 2015-10-23 NOTE — Telephone Encounter (Signed)
Pt notified of myoview results by phone with verbal understanding to results.

## 2015-10-30 ENCOUNTER — Inpatient Hospital Stay: Admit: 2015-10-30 | Payer: Medicare Other | Admitting: Orthopedic Surgery

## 2015-10-30 SURGERY — ARTHROPLASTY, KNEE, TOTAL
Anesthesia: Spinal | Site: Knee | Laterality: Left

## 2015-11-05 ENCOUNTER — Ambulatory Visit: Payer: Medicare Other | Admitting: Family Medicine

## 2015-11-07 ENCOUNTER — Encounter: Payer: Medicare Other | Admitting: Gastroenterology

## 2015-11-14 ENCOUNTER — Ambulatory Visit (INDEPENDENT_AMBULATORY_CARE_PROVIDER_SITE_OTHER): Payer: Medicare Other | Admitting: Family Medicine

## 2015-11-14 ENCOUNTER — Encounter: Payer: Self-pay | Admitting: Family Medicine

## 2015-11-14 VITALS — BP 120/73 | HR 58 | Temp 98.6°F | Resp 16 | Ht 63.0 in | Wt 149.0 lb

## 2015-11-14 DIAGNOSIS — M4317 Spondylolisthesis, lumbosacral region: Secondary | ICD-10-CM | POA: Diagnosis not present

## 2015-11-14 DIAGNOSIS — M47817 Spondylosis without myelopathy or radiculopathy, lumbosacral region: Secondary | ICD-10-CM | POA: Diagnosis not present

## 2015-11-14 DIAGNOSIS — M5136 Other intervertebral disc degeneration, lumbar region: Secondary | ICD-10-CM

## 2015-11-14 MED ORDER — HYDROCODONE-ACETAMINOPHEN 5-325 MG PO TABS: 1.0000 | ORAL_TABLET | Freq: Four times a day (QID) | ORAL | Status: DC | PRN

## 2015-11-14 NOTE — Progress Notes (Signed)
Pre visit review using our clinic review tool, if applicable. No additional management support is needed unless otherwise documented below in the visit note. 

## 2015-11-14 NOTE — Progress Notes (Signed)
OFFICE VISIT  11/16/2015   CC:  Chief Complaint  Patient presents with  . Back Pain    x 1 year   HPI:    Patient is a 75 y.o. Caucasian female who presents for back pain.  Suffering with lower back and L hip pain for years, gotten worse over the last 1 yr.  She recently saw a specialist at [facility] 07/2015 and got plain xrays and was given a rx for PT and told to f/u with him but she did not.  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 Has never had radiation of the pain down legs until last couple of days it has moved down L leg to the mid hamstring level.  No paresthesias.  Ibuprofen is used seldom. Has never had a surgery on her back.  She has seen Dr. Nelva Bush locally one time about 6 mo ago and is set to get an epidural injection next month at some point by him.    Past Medical History  Diagnosis Date  . Arthritis     DJD, back and both hips  . Hyperlipidemia   . GERD (gastroesophageal reflux disease)   . Osteoporosis, senile     stable at the left hip 2014-2015.  Took fosamax for 10 yrs.  Plan: get repeat DEXA q99mo.  . Diverticulosis     on colonoscopy 2004  . Scoliosis   . Peripheral neuropathy ([place]) 2008    Idiopathic vs familial (motor>sensory): gabapentin helpful  . Hx of cardiovascular stress test     Lexiscan Myoview (2/14):  Low risk, small ant defect likely breast attenuation, no ischemia, EF 69% (no change from 2010).    . Mitral regurgitation     Echo (2/14):  EF 60-65%, Gr 1 DD, mild AI, mild bileaflet MVP, mod post directed MR, mild LAE, PASP 35.  Marland Kitchen MVP (mitral valve prolapse)   . Shoulder pain left    to have surgery 10/02/14 for rotator cuff repair  . PAF (paroxysmal atrial fibrillation) [facility])     Past Surgical History  Procedure Laterality Date  . Tonsillectomy    . Vaginal hysterectomy      For Uterine Deviation   . Total knee arthroplasty  09/2009    Right ;Dr Alvan Dame   . Cholecystectomy  03/2010    Dr.Gross  . Lumpectomy  06/2009    "Fatty Necrosis"  . Breast surgery  Nov 2010    Benign biopsy, right  . Cystocele repair    . Combined hysterectomy vaginal / oophorectomy / a&p repair  0000000    uncertain if ovaries were removed or not  . Colonoscopy  10/03/2002    No polyps.  Repeat 10 yrs (Dr. Olevia Perches)  . Pft's  2014    NORMAL  . Rotator cuff repair Left 10/02/14    Dr. Veverly Fells  . Transthoracic echocardiogram  09/2012    EF 60-65%, Gr 1 DD, mild AI, mild bileaflet MVP, mod post directed MR, mild LAE, PASP 35.    Outpatient Prescriptions Prior to Visit  Medication Sig Dispense Refill  . apixaban (ELIQUIS) 5 MG TABS tablet Take 1 tablet (5 mg total) by mouth 2 (two) times daily. 60 tablet 3  . B Complex-C (B-COMPLEX WITH VITAMIN C) tablet Take 1 tablet by mouth daily.    . Biotin 5000 MCG CAPS Take 5,000 mcg by mouth daily.    . Calcium Carbonate (CALCIUM 600  PO) Take 600 mg by mouth 2 (two) times daily.     . cholecalciferol (VITAMIN D) 1000 UNITS tablet Take 5,000 Units by mouth daily.     . cyanocobalamin (,VITAMIN B-12,) 1000 MCG/ML injection Inject 1 mL (1,000 mcg total) into the muscle every 30 (thirty) days. 10 mL 1  . flecainide (TAMBOCOR) 50 MG tablet Take 1 tablet (50 mg total) by mouth 2 (two) times daily. 60 tablet 11  . fluticasone (FLONASE) 50 MCG/ACT nasal spray Place 2 sprays into both nostrils daily as needed for allergies or rhinitis.    Marland Kitchen gabapentin (NEURONTIN) 300 MG capsule TAKE 1 CAPSULE BY MOUTH AT LUNCH, 1 AT DINNER, AND 2 AT BEDTIME 360 capsule 3  . magnesium gluconate (MAGONATE) 30 MG tablet Take 30 mg by mouth 2 (two) times daily as needed (for allergies).    . metoprolol tartrate (LOPRESSOR) 25 MG tablet Take 0.5 tablets (12.5 mg total) by mouth 2 (two) times daily.    . nitroGLYCERIN (NITROSTAT) 0.4 MG SL tablet Place 1 tablet (0.4 mg total) under the tongue every 5 (five) minutes as needed for chest pain. 25 tablet 5  . Omega-3  Fatty Acids (FISH OIL) 1200 MG CAPS Take 1,000 mg by mouth 2 (two) times daily.     . pravastatin (PRAVACHOL) 20 MG tablet Take 20 mg by mouth 3 (three) times a week. Take on Monday, Wednesday, and friday     No facility-administered medications prior to visit.    Allergies  Allergen Reactions  . Sulfa Antibiotics Swelling    face    ROS As per HPI  PE: Blood pressure 120/73, pulse 58, temperature 98.6 F (37 C), temperature source Oral, resp. rate 16, height 5\' 3"  (1.6 m), weight 149 lb (67.586 kg), SpO2 95 %. Gen: Alert, well appearing.  Patient is oriented to person, place, time, and situation. L spine ROM: intact w/out pain except lateral bending to the R. Nontender to palpation on L spine. Sitting SLR neg bilat. Hips: mild L greater troch TTP.  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 Spent some time reviewing past x-rays and discussing past treatments with pt today. She was a bit confused, thinking she needed a referral to yet another specialist today. However, she is already in good hands with Dr. Nelva Bush and there is a plan in place to get lumbar ESI per pt report today. I told her she did not need to pursue any other specialist help at this time, and I told her I would help her out some with the acute pain mgmt: vicodin 5/325, 1-2 q6h prn, #30.  An After Visit Summary was printed and given to the patient.  FOLLOW UP: Return if symptoms worsen or fail to improve.  Signed:  Crissie Sickles, MD           11/16/2015

## 2015-11-16 ENCOUNTER — Other Ambulatory Visit: Payer: Self-pay | Admitting: Cardiology

## 2015-11-19 ENCOUNTER — Ambulatory Visit: Payer: Medicare Other | Admitting: Cardiology

## 2015-11-19 NOTE — Telephone Encounter (Signed)
Rx(s) sent to pharmacy electronically.  

## 2015-11-21 ENCOUNTER — Telehealth: Payer: Self-pay | Admitting: *Deleted

## 2015-11-21 DIAGNOSIS — M47817 Spondylosis without myelopathy or radiculopathy, lumbosacral region: Secondary | ICD-10-CM

## 2015-11-21 NOTE — Telephone Encounter (Signed)
Pt LMOM on 11/20/15 at 4:26pm requesting a new referral be sent to Dr. Nelva Bush. She stated that she has to have a new referral due to insurance. She stated that she has an apt on May 2nd with Dr. Nelva Bush for epidural injections. She is requesting a call back once this has been done. Thanks.

## 2015-11-23 ENCOUNTER — Ambulatory Visit: Payer: Medicare Other | Admitting: Cardiology

## 2015-11-27 NOTE — Telephone Encounter (Signed)
Pt advised and voiced understanding.   

## 2015-11-27 NOTE — Telephone Encounter (Signed)
OK, referral has been ordered as per pt's request.

## 2015-12-04 DIAGNOSIS — M4125 Other idiopathic scoliosis, thoracolumbar region: Secondary | ICD-10-CM | POA: Diagnosis not present

## 2015-12-04 DIAGNOSIS — M5136 Other intervertebral disc degeneration, lumbar region: Secondary | ICD-10-CM | POA: Diagnosis not present

## 2015-12-06 DIAGNOSIS — Z01419 Encounter for gynecological examination (general) (routine) without abnormal findings: Secondary | ICD-10-CM | POA: Diagnosis not present

## 2015-12-06 DIAGNOSIS — Z6826 Body mass index (BMI) 26.0-26.9, adult: Secondary | ICD-10-CM | POA: Diagnosis not present

## 2015-12-06 DIAGNOSIS — N8111 Cystocele, midline: Secondary | ICD-10-CM | POA: Diagnosis not present

## 2015-12-06 DIAGNOSIS — N8110 Cystocele, unspecified: Secondary | ICD-10-CM | POA: Insufficient documentation

## 2015-12-06 DIAGNOSIS — Z1389 Encounter for screening for other disorder: Secondary | ICD-10-CM | POA: Diagnosis not present

## 2015-12-06 DIAGNOSIS — Z130 Encounter for screening for diseases of the blood and blood-forming organs and certain disorders involving the immune mechanism: Secondary | ICD-10-CM | POA: Diagnosis not present

## 2015-12-07 ENCOUNTER — Telehealth: Payer: Self-pay

## 2015-12-07 ENCOUNTER — Encounter: Payer: Self-pay | Admitting: Family Medicine

## 2015-12-07 NOTE — Telephone Encounter (Signed)
Received medical clearance from [facility].Dr.Jordan advised ok to hold Eliquis 3 days before injection.Form faxed back to fax # [phone].

## 2015-12-19 DIAGNOSIS — M5416 Radiculopathy, lumbar region: Secondary | ICD-10-CM | POA: Diagnosis not present

## 2016-01-10 ENCOUNTER — Encounter: Payer: Self-pay | Admitting: Cardiology

## 2016-01-10 ENCOUNTER — Ambulatory Visit (INDEPENDENT_AMBULATORY_CARE_PROVIDER_SITE_OTHER): Payer: Medicare Other | Admitting: Cardiology

## 2016-01-10 VITALS — BP 148/74 | HR 62 | Ht 61.0 in | Wt 147.0 lb

## 2016-01-10 DIAGNOSIS — I480 Paroxysmal atrial fibrillation: Secondary | ICD-10-CM

## 2016-01-10 DIAGNOSIS — I341 Nonrheumatic mitral (valve) prolapse: Secondary | ICD-10-CM

## 2016-01-10 DIAGNOSIS — I340 Nonrheumatic mitral (valve) insufficiency: Secondary | ICD-10-CM | POA: Diagnosis not present

## 2016-01-10 NOTE — Patient Instructions (Signed)
Continue your current therapy  You are clear for knee surgery  I will see you in 6 months.

## 2016-01-10 NOTE — Progress Notes (Signed)
Cardiology Office Note:    Date:  01/10/2016   ID:  Cassie White, DOB 1941/06/20, MRN [id number]  PCP:  Tammi Sou, MD  Cardiologist:  Dr. Jacyln Carmer [place]    Chief Complaint  Patient presents with  . 2 MONTHS  . Dizziness  . Edema    Feet at night.    History of Present Illness:     Cassie White is a 75 y.o. female with a hx of MVP, mitral regurgitation, HL, GERD.  She previously had a myoview in 2010 with anterior breast attenuation, o/w normal. She underwent stress testing with a Lexiscan Myoview (2/14): Low risk, small ant defect likely breast attenuation, no ischemia, EF 69% (no change from 2010). Echo (2/14): EF 60-65%, Gr 1 DD, mild AI, mild bileaflet MVP, mod post directed MR, mild LAE, PASP 35.   Seen on 09/17/15 for surgical clearance for upcoming knee surgery. It was felt that she could proceed with the surgery at acceptable risk. Follow-up echocardiogram was arranged. This demonstrated anterior leaflet mitral valve prolapse with mild mitral regurgitation. Patient was noted to be in atrial fibrillation during the study. She was seen again on 09/28/15. She was back in NSR. Upon further review, when in atrial fibrillation, heart rates were in the 130s.  CHADS2-VASc=3 (age, female, HTN).  She was placed on Apixaban 5 bid.  Metoprolol Tartrate dose was adjusted. Follow up Echo and Myoview studies noted below.   She was seen again in early March She was in atrial fibrillation with RVR. She was started on flecainide 50 mg twice a day and converted to NSR.   On follow up today she is feeling well. She did have a spinal injection but states this did not help her back pain. She is still planning to proceed with TKR by Dr. Alvan Dame.    Past Medical History  Diagnosis Date  . Arthritis     DJD, back and both hips  . Hyperlipidemia   . GERD (gastroesophageal reflux disease)   . Osteoporosis, senile     stable at the left hip 2014-2015.  Took fosamax for 10 yrs.  Plan: get  repeat DEXA q1mo.  . Diverticulosis     on colonoscopy 2004  . Scoliosis   . Peripheral neuropathy ([place]) 2008    Idiopathic vs familial (motor>sensory): gabapentin helpful  . Hx of cardiovascular stress test     Lexiscan Myoview (2/14):  Low risk, small ant defect likely breast attenuation, no ischemia, EF 69% (no change from 2010).    . Mitral regurgitation     Echo (2/14):  EF 60-65%, Gr 1 DD, mild AI, mild bileaflet MVP, mod post directed MR, mild LAE, PASP 35.  Marland Kitchen MVP (mitral valve prolapse)   . Shoulder pain left    to have surgery 10/02/14 for rotator cuff repair  . PAF (paroxysmal atrial fibrillation) ([place])   . Chronic low back pain without sciatica     DDD + scoliosis.  As of 12/04/15, Dr. Nelva Bush will do a back injection and follow her up in 1 mo.    Past Surgical History  Procedure Laterality Date  . Tonsillectomy    . Vaginal hysterectomy      For Uterine Deviation   . 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 Cholecystectomy  03/2010    Dr.Gross  . Lumpectomy  06/2009    "Fatty Necrosis"  . Breast surgery  Nov 2010    Benign biopsy, right  .  Cystocele repair    . Combined hysterectomy vaginal / oophorectomy / a&p repair  0000000    uncertain if ovaries were removed or not  . Colonoscopy  10/03/2002    No polyps.  Repeat 10 yrs (Dr. Olevia Perches)  . Pft's  2014    NORMAL  . Rotator cuff repair Left 10/02/14    Dr. Veverly Fells  . Transthoracic echocardiogram  09/2012    EF 60-65%, Gr 1 DD, mild AI, mild bileaflet MVP, mod post directed MR, mild LAE, PASP 35.    Current Medications: Outpatient Prescriptions Prior to Visit  Medication Sig Dispense Refill  . apixaban (ELIQUIS) 5 MG TABS tablet Take 1 tablet (5 mg total) by mouth 2 (two) times daily. 60 tablet 3  . B Complex-C (B-COMPLEX WITH VITAMIN C) tablet Take 1 tablet by mouth daily.    . Biotin 5000 MCG CAPS Take 5,000 mcg by mouth daily.    . Calcium Carbonate (CALCIUM 600 PO) Take 600 mg by mouth 2 (two) times  daily.     . cholecalciferol (VITAMIN D) 1000 UNITS tablet Take 5,000 Units by mouth daily.     . cyanocobalamin (,VITAMIN B-12,) 1000 MCG/ML injection Inject 1 mL (1,000 mcg total) into the muscle every 30 (thirty) days. 10 mL 1  . flecainide (TAMBOCOR) 50 MG tablet Take 1 tablet (50 mg total) by mouth 2 (two) times daily. 60 tablet 11  . fluticasone (FLONASE) 50 MCG/ACT nasal spray Place 2 sprays into both nostrils daily as needed for allergies or rhinitis.    Marland Kitchen gabapentin (NEURONTIN) 300 MG capsule TAKE 1 CAPSULE BY MOUTH AT LUNCH, 1 AT DINNER, AND 2 AT BEDTIME 360 capsule 3  . HYDROcodone-acetaminophen (NORCO/VICODIN) 5-325 MG tablet Take 1 tablet by mouth every 6 (six) hours as needed for moderate pain. 30 tablet 0  . magnesium gluconate (MAGONATE) 30 MG tablet Take 30 mg by mouth 2 (two) times daily as needed (for allergies).    . metoprolol tartrate (LOPRESSOR) 25 MG tablet Take 0.5 tablets (12.5 mg total) by mouth 2 (two) times daily.    . metoprolol tartrate (LOPRESSOR) 25 MG tablet TAKE 0.5 TABLETS (12.5 MG TOTAL) BY MOUTH 2 (TWO) TIMES DAILY. 90 tablet 1  . nitroGLYCERIN (NITROSTAT) 0.4 MG SL tablet Place 1 tablet (0.4 mg total) under the tongue every 5 (five) minutes as needed for chest pain. 25 tablet 5  . Omega-3 Fatty Acids (FISH OIL) 1200 MG CAPS Take 1,000 mg by mouth 2 (two) times daily.     . pravastatin (PRAVACHOL) 20 MG tablet Take 20 mg by mouth 3 (three) times a week. Take on Monday, Wednesday, and friday     No facility-administered medications prior to visit.     Allergies:   Sulfa antibiotics   Social History   Social History  . Marital Status: Married    Spouse Name: N/A  . Number of Children: 3  . Years of Education: HS grad   Occupational History  . [employer]- bus driver- retired    Social History Main Topics  . Smoking status: Never Smoker   . Smokeless tobacco: Never Used  . Alcohol Use: No  . Drug Use: No  . Sexual Activity: No   Other Topics  Concern  . None   Social History Narrative   Widow, has 3 children, 4 grandchildren.   Orig from [employer].   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   Caffiene, 1 cup daily avg.  No tob, no alc, no drugs.   Exercise: walking, limited by bilat hip pain.     Family History:  The patient's family history includes Coronary artery disease in her brother; Heart attack in her father; Heart attack (age of onset: 30) in her son; Ovarian cancer in her mother; Stroke (age of onset: 29) in her father. There is no history of Diabetes.   ROS:   Please see the history of present illness.    Review of Systems  Musculoskeletal: Positive for arthritis and back pain.  All other systems reviewed and are negative.   Physical Exam:    VS:  BP 148/74 mmHg  Pulse 62  Ht 5\' 1"  (1.549 m)  Wt 147 lb (66.679 kg)  BMI 27.79 kg/m2   GEN: Well nourished, well developed, in no acute distress HEENT: normal Neck: no JVD, no masses Cardiac: Normal S1/S2, RRR; no murmurs,  no edema    Respiratory:  clear to auscultation bilaterally; no wheezing, rhonchi or rales GI: soft, nontender, nondistended MS: no deformity or atrophy Skin: warm and dry Neuro: No focal deficits  Psych: Alert and oriented x 3, normal affect  Wt Readings from Last 3 Encounters:  01/10/16 147 lb (66.679 kg)  11/14/15 149 lb (67.586 kg)  10/15/15 145 lb 12.8 oz (66.134 kg)      Studies/Labs Reviewed:     EKG:  EKG is not ordered today.   Recent Labs: 07/03/2015: ALT 13 09/24/2015: BUN 10; Creatinine, Ser 0.74; Hemoglobin 13.2; Platelets 174.0; Potassium 4.3; Sodium 143; TSH 1.63   Recent Lipid Panel    Component Value Date/Time   CHOL 220* 07/03/2015 0813   CHOL 211* 04/28/2014 1026   TRIG 94.0 07/03/2015 0813   TRIG 95 04/28/2014 1026   HDL 92.40 07/03/2015 0813   HDL 101 04/28/2014 1026   CHOLHDL 2 07/03/2015 0813   VLDL 18.8 07/03/2015 0813   LDLCALC 109* 07/03/2015 0813   LDLCALC 91  04/28/2014 1026   LDLDIRECT 87.5 09/07/2012 1022    Additional studies/ records that were reviewed today include:   Echo 09/27/15 Mild LVH, EF 55-60%, mild AI, mild anterior MVP, mild MR, PASP 36 mmHg  Myoview 3/17 There is some interference on images likely secondary to breast tissue Overall, images appear low risk   ASSESSMENT:     1. Mitral insufficiency   2. Mitral valve prolapse   3. Paroxysmal atrial fibrillation with rapid ventricular response (HCC)     PLAN:     In order of problems listed above:  1. PAF - She is  in sinus rhythm today. Will continue Flecainide and metoprolol at current doses. Continue Eliquis.  She may proceed with her knee replacement now. She may stop Eliquis 48 hrs before surgery.  2. HTN - Controlled.   3. MVP - Mild MR by recent Echo.     4. HL - Continue Pravastatin.    5. DJD   Medication Adjustments/Labs and Tests Ordered: Current medicines are reviewed at length with the patient today.  Concerns regarding medicines are outlined above.  Medication changes, Labs and Tests ordered today are outlined in the Patient Instructions noted below. Patient Instructions  Continue your current therapy  You are clear for knee surgery  I will see you in 6 months.   Signed, Eloy Fehl [place], MD  01/10/2016 3:17 PM    [facility] [place], [place], [place]  [address] Phone: [phone]; Fax: [phone]

## 2016-01-31 ENCOUNTER — Telehealth: Payer: Self-pay

## 2016-01-31 NOTE — Telephone Encounter (Signed)
Received surgical clearance form from [place].Dr.Jordan cleared pt for surgery and advised ok to hold Eliquis 72 hrs prior to surgery.

## 2016-02-11 DIAGNOSIS — M1712 Unilateral primary osteoarthritis, left knee: Secondary | ICD-10-CM | POA: Diagnosis not present

## 2016-02-12 DIAGNOSIS — R21 Rash and other nonspecific skin eruption: Secondary | ICD-10-CM | POA: Diagnosis not present

## 2016-02-12 DIAGNOSIS — W57XXXA Bitten or stung by nonvenomous insect and other nonvenomous arthropods, initial encounter: Secondary | ICD-10-CM | POA: Diagnosis not present

## 2016-02-14 ENCOUNTER — Other Ambulatory Visit: Payer: Self-pay | Admitting: Internal Medicine

## 2016-02-14 NOTE — Telephone Encounter (Signed)
Routing refill request to patient's new pcp to handle

## 2016-02-19 ENCOUNTER — Other Ambulatory Visit: Payer: Self-pay

## 2016-02-19 MED ORDER — APIXABAN 5 MG PO TABS: 5.0000 mg | ORAL_TABLET | Freq: Two times a day (BID) | ORAL | Status: DC

## 2016-02-29 NOTE — Patient Instructions (Addendum)
Cassie White  02/29/2016   Your procedure is scheduled on: 03/10/16  Report to [facility] Main  Entrance take [facility]  elevators to 3rd floor to  [place] at 12:15 PM  Call this number if you have problems the morning of surgery [phone]   Remember: ONLY 1 PERSON MAY GO WITH YOU TO SHORT STAY TO GET  READY MORNING OF YOUR SURGERY.  Do not eat food  After Midnight.              MAY HAVE CLEAR LIQUIDS MORNING OF SURGERY UNTIL 0815 AM-- THEN NOTHING BY MOUTH     Take these medicines the morning of surgery with A SIP OF WATER: Flecainide, Gabapentin, METOPROLOL May take Hydrocodone/ Nitroglycerin if needed DO NOT TAKE ANY DIABETIC MEDICATIONS DAY OF YOUR SURGERY                               You may not have any metal on your body including hair pins and              piercings  Do not wear jewelry, make-up, lotions, powders or perfumes, deodorant             Do not wear nail polish.  Do not shave  48 hours prior to surgery.              Men may shave face and neck.   Do not bring valuables to the hospital. [place].  Contacts, dentures or bridgework may not be worn into surgery.  Leave suitcase in the car. After surgery it may be brought to your room.           [place] - Preparing for Surgery Before surgery, you can play an important role.  Because skin is not sterile, your skin needs to be as free of germs as possible.  You can reduce the number of germs on your skin by washing with CHG (chlorahexidine gluconate) soap before surgery.  CHG is an antiseptic cleaner which kills germs and bonds with the skin to continue killing germs even after washing. Please DO NOT use if you have an allergy to CHG or antibacterial soaps.  If your skin becomes reddened/irritated stop using the CHG and inform your nurse when you arrive at Short Stay. Do not shave (including legs and underarms) for at least 48 hours prior  to the first CHG shower.  You may shave your face/neck. Please follow these instructions carefully:  1.  Shower with CHG Soap the night before surgery and the  morning of Surgery.  2.  If you choose to wash your hair, wash your hair first as usual with your  normal  shampoo.  3.  After you shampoo, rinse your hair and body thoroughly to remove the  shampoo.                           4.  Use CHG as you would any other liquid soap.  You can apply chg directly  to the skin and wash                       Gently with  a scrungie or clean washcloth.  5.  Apply the CHG Soap to your body ONLY FROM THE NECK DOWN.   Do not use on face/ open                           Wound or open sores. Avoid contact with eyes, ears mouth and genitals (private parts).                       Wash face,  Genitals (private parts) with your normal soap.             6.  Wash thoroughly, paying special attention to the area where your surgery  will be performed.  7.  Thoroughly rinse your body with warm water from the neck down.  8.  DO NOT shower/wash with your normal soap after using and rinsing off  the CHG Soap.                9.  Pat yourself dry with a clean towel.            10.  Wear clean pajamas.            11.  Place clean sheets on your bed the night of your first shower and do not  sleep with pets. Day of Surgery : Do not apply any lotions/deodorants the morning of surgery.  Please wear clean clothes to the hospital/surgery center.  FAILURE TO FOLLOW THESE INSTRUCTIONS MAY RESULT IN THE CANCELLATION OF YOUR SURGERY PATIENT SIGNATURE_________________________________  NURSE SIGNATURE__________________________________  ________________________________________________________________________    CLEAR LIQUID DIET   Foods Allowed                                                                     Foods Excluded  Coffee and tea, regular and decaf                             liquids that you cannot  Plain Jell-O  in any flavor                                             see through such as: Fruit ices (not with fruit pulp)                                     milk, soups, orange juice  Iced Popsicles                                    All solid food Carbonated beverages, regular and diet                                    Cranberry, grape and apple juices Sports drinks like Gatorade Lightly seasoned clear broth or consume(fat free) Sugar, honey syrup  Sample Menu  Breakfast                                Lunch                                     Supper Cranberry juice                    Beef broth                            Chicken broth Jell-O                                     Grape juice                           Apple juice Coffee or tea                        Jell-O                                      Popsicle                                                Coffee or tea                        Coffee or tea  _____________________________________________________________________    Incentive Spirometer  An incentive spirometer is a tool that can help keep your lungs clear and active. This tool measures how well you are filling your lungs with each breath. Taking long deep breaths may help reverse or decrease the chance of developing breathing (pulmonary) problems (especially infection) following:  A long period of time when you are unable to move or be active. BEFORE THE PROCEDURE   If the spirometer includes an indicator to show your best effort, your nurse or respiratory therapist will set it to a desired goal.  If possible, sit up straight or lean slightly forward. Try not to slouch.  Hold the incentive spirometer in an upright position. INSTRUCTIONS FOR USE  1. Sit on the edge of your bed if possible, or sit up as far as you can in bed or on a chair. 2. Hold the incentive spirometer in an upright position. 3. Breathe out normally. 4. Place the mouthpiece in your mouth and seal your  lips tightly around it. 5. Breathe in slowly and as deeply as possible, raising the piston or the ball toward the top of the column. 6. Hold your breath for 3-5 seconds or for as long as possible. Allow the piston or ball to fall to the bottom of the column. 7. Remove the mouthpiece from your mouth and breathe out normally. 8. Rest for a few seconds and repeat Steps 1 through 7 at least 10 times every 1-2 hours when you are awake. Take your time and take a few normal breaths between deep breaths.  9. The spirometer may include an indicator to show your best effort. Use the indicator as a goal to work toward during each repetition. 10. After each set of 10 deep breaths, practice coughing to be sure your lungs are clear. If you have an incision (the cut made at the time of surgery), support your incision when coughing by placing a pillow or rolled up towels firmly against it. Once you are able to get out of bed, walk around indoors and cough well. You may stop using the incentive spirometer when instructed by your caregiver.  RISKS AND COMPLICATIONS  Take your time so you do not get dizzy or light-headed.  If you are in pain, you may need to take or ask for pain medication before doing incentive spirometry. It is harder to take a deep breath if you are having pain. AFTER USE  Rest and breathe slowly and easily.  It can be helpful to keep track of a log of your progress. Your caregiver can provide you with a simple table to help with this. If you are using the spirometer at home, follow these instructions: [place] IF:   You are having difficultly using the spirometer.  You have trouble using the spirometer as often as instructed.  Your pain medication is not giving enough relief while using the spirometer.  You develop fever of 100.5 F (38.1 C) or higher. SEEK IMMEDIATE MEDICAL CARE IF:   You cough up bloody sputum that had not been present before.  You develop fever of 102 F  (38.9 C) or greater.  You develop worsening pain at or near the incision site. MAKE SURE YOU:   Understand these instructions.  Will watch your condition.  Will get help right away if you are not doing well or get worse. Document Released: 12/01/2006 Document Revised: 10/13/2011 Document Reviewed: 02/01/2007 ExitCare Patient Information 2014 ExitCare, [place].   ________________________________________________________________________  WHAT IS A BLOOD TRANSFUSION? Blood Transfusion Information  A transfusion is the replacement of blood or some of its parts. Blood is made up of multiple cells which provide different functions.  Red blood cells carry oxygen and are used for blood loss replacement.  White blood cells fight against infection.  Platelets control bleeding.  Plasma helps clot blood.  Other blood products are available for specialized needs, such as hemophilia or other clotting disorders. BEFORE THE TRANSFUSION  Who gives blood for transfusions?   Healthy volunteers who are fully evaluated to make sure their blood is safe. This is blood bank blood. Transfusion therapy is the safest it has ever been in the practice of medicine. Before blood is taken from a donor, a complete history is taken to make sure that person has no history of diseases nor engages in risky social behavior (examples are intravenous drug use or sexual activity with multiple partners). The donor's travel history is screened to minimize risk of transmitting infections, such as malaria. The donated blood is tested for signs of infectious diseases, such as HIV and hepatitis. The blood is then tested to be sure it is compatible with you in order to minimize the chance of a transfusion reaction. If you or a relative donates blood, this is often done in anticipation of surgery and is not appropriate for emergency situations. It takes many days to process the donated blood. RISKS AND COMPLICATIONS Although  transfusion therapy is very safe and saves many lives, the main dangers of transfusion include:   Getting an infectious disease.  Developing  a transfusion reaction. This is an allergic reaction to something in the blood you were given. Every precaution is taken to prevent this. The decision to have a blood transfusion has been considered carefully by your caregiver before blood is given. Blood is not given unless the benefits outweigh the risks. AFTER THE TRANSFUSION  Right after receiving a blood transfusion, you will usually feel much better and more energetic. This is especially true if your red blood cells have gotten low (anemic). The transfusion raises the level of the red blood cells which carry oxygen, and this usually causes an energy increase.  The nurse administering the transfusion will monitor you carefully for complications. HOME CARE INSTRUCTIONS  No special instructions are needed after a transfusion. You may find your energy is better. Speak with your caregiver about any limitations on activity for underlying diseases you may have. SEEK MEDICAL CARE IF:   Your condition is not improving after your transfusion.  You develop redness or irritation at the intravenous (IV) site. SEEK IMMEDIATE MEDICAL CARE IF:  Any of the following symptoms occur over the next 12 hours:  Shaking chills.  You have a temperature by mouth above 102 F (38.9 C), not controlled by medicine.  Chest, back, or muscle pain.  People around you feel you are not acting correctly or are confused.  Shortness of breath or difficulty breathing.  Dizziness and fainting.  You get a rash or develop hives.  You have a decrease in urine output.  Your urine turns a dark color or changes to pink, red, or brown. Any of the following symptoms occur over the next 10 days:  You have a temperature by mouth above 102 F (38.9 C), not controlled by medicine.  Shortness of breath.  Weakness after normal  activity.  The white part of the eye turns yellow (jaundice).  You have a decrease in the amount of urine or are urinating less often.  Your urine turns a dark color or changes to pink, red, or brown. Document Released: 07/18/2000 Document Revised: 10/13/2011 Document Reviewed: 03/06/2008 [facility] Patient Information 2014 [place], [place].  _______________________________________________________________________

## 2016-02-29 NOTE — Progress Notes (Signed)
Clearance per phone note dated 01/31/16 with Eloquis recommendation. lov dr Martinique 6/17, eccho 2/17, stress 3/17, ekg  3/17  ALL IN EPIC

## 2016-03-03 ENCOUNTER — Encounter (HOSPITAL_COMMUNITY)
Admission: RE | Admit: 2016-03-03 | Discharge: 2016-03-03 | Disposition: A | Discharge status: Home / Self Care | Payer: Medicare Other | Source: Ambulatory Visit | Attending: Orthopedic Surgery | Admitting: Orthopedic Surgery

## 2016-03-03 ENCOUNTER — Encounter (HOSPITAL_COMMUNITY): Payer: Self-pay | Admitting: *Deleted

## 2016-03-03 DIAGNOSIS — G8929 Other chronic pain: Secondary | ICD-10-CM | POA: Insufficient documentation

## 2016-03-03 DIAGNOSIS — G629 Polyneuropathy, unspecified: Secondary | ICD-10-CM | POA: Insufficient documentation

## 2016-03-03 DIAGNOSIS — E785 Hyperlipidemia, unspecified: Secondary | ICD-10-CM | POA: Insufficient documentation

## 2016-03-03 DIAGNOSIS — Z882 Allergy status to sulfonamides status: Secondary | ICD-10-CM | POA: Diagnosis not present

## 2016-03-03 DIAGNOSIS — I10 Essential (primary) hypertension: Secondary | ICD-10-CM | POA: Insufficient documentation

## 2016-03-03 DIAGNOSIS — Z0183 Encounter for blood typing: Secondary | ICD-10-CM | POA: Insufficient documentation

## 2016-03-03 DIAGNOSIS — M1712 Unilateral primary osteoarthritis, left knee: Secondary | ICD-10-CM | POA: Diagnosis not present

## 2016-03-03 DIAGNOSIS — M549 Dorsalgia, unspecified: Secondary | ICD-10-CM | POA: Diagnosis not present

## 2016-03-03 DIAGNOSIS — M810 Age-related osteoporosis without current pathological fracture: Secondary | ICD-10-CM | POA: Diagnosis not present

## 2016-03-03 DIAGNOSIS — I340 Nonrheumatic mitral (valve) insufficiency: Secondary | ICD-10-CM | POA: Diagnosis not present

## 2016-03-03 DIAGNOSIS — Z01812 Encounter for preprocedural laboratory examination: Secondary | ICD-10-CM | POA: Diagnosis not present

## 2016-03-03 LAB — CBC
HEMATOCRIT: 42 % (ref 36.0–46.0)
HEMOGLOBIN: 13.7 g/dL (ref 12.0–15.0)
MCH: 29.9 pg (ref 26.0–34.0)
MCHC: 32.6 g/dL (ref 30.0–36.0)
MCV: 91.7 fL (ref 78.0–100.0)
PLATELETS: 182 10*3/uL (ref 150–400)
RBC: 4.58 MIL/uL (ref 3.87–5.11)
RDW: 14.1 % (ref 11.5–15.5)
WBC: 5.6 10*3/uL (ref 4.0–10.5)

## 2016-03-03 LAB — BASIC METABOLIC PANEL
Anion gap: 5 (ref 5–15)
BUN: 13 mg/dL (ref 6–20)
CHLORIDE: 107 mmol/L (ref 101–111)
CO2: 27 mmol/L (ref 22–32)
CREATININE: 0.7 mg/dL (ref 0.44–1.00)
Calcium: 10.2 mg/dL (ref 8.9–10.3)
GFR calc non Af Amer: >60 mL/min (ref >60)
Glucose, Bld: 94 mg/dL (ref 65–99)
Potassium: 4.8 mmol/L (ref 3.5–5.1)
Sodium: 139 mmol/L (ref 135–145)

## 2016-03-03 LAB — PROTIME-INR
INR: 1.06
Prothrombin Time: 13.9 seconds (ref 11.4–15.2)

## 2016-03-03 LAB — SURGICAL PCR SCREEN
MRSA, PCR: NEGATIVE
STAPHYLOCOCCUS AUREUS: NEGATIVE

## 2016-03-03 NOTE — Progress Notes (Signed)
Patient states has been instructed to stop eloquis 3 days before surgery

## 2016-03-03 NOTE — H&P (Signed)
TOTAL KNEE ADMISSION H&P  Patient is being admitted for left total knee arthroplasty.  Subjective:  Chief Complaint:    Left knee primary OA / pain  HPI: Cassie White, 75 y.o. female, has a history of pain and functional disability in the left knee due to arthritis and has failed non-surgical conservative treatments for greater than 12 weeks to include NSAID's and/or analgesics and activity modification.  Onset of symptoms was gradual, starting 6+ years ago with gradually worsening course since that time. The patient noted prior procedures on the knee to include  arthroplasty on the right knee per Dr. Alvan Dame.  Patient currently rates pain in the left knee(s) at 8 out of 10 with activity. Patient has worsening of pain with activity and weight bearing, pain that interferes with activities of daily living, pain with passive range of motion, crepitus and joint swelling.  Patient has evidence of periarticular osteophytes and joint space narrowing by imaging studies.  There is no active infection.   Risks, benefits and expectations were discussed with the patient.  Risks including but not limited to the risk of anesthesia, blood clots, nerve damage, blood vessel damage, failure of the prosthesis, infection and up to and including death.  Patient understand the risks, benefits and expectations and wishes to proceed with surgery.   PCP: Tammi Sou, MD  D/C Plans:      Home  Post-op Meds:       No Rx given  Tranexamic Acid:      To be given - IV  Decadron:      Is to be given  FYI:     Eliquis   Norco    Patient Active Problem List   Diagnosis Date Noted  . Paroxysmal atrial fibrillation with rapid ventricular response ([place]) 09/28/2015  . Maxillary sinusitis, acute 08/27/2015  . Acute bronchitis 09/08/2014  . Mitral insufficiency 05/12/2014  . HTN (hypertension) 04/28/2014  . Lactic acidosis 09/07/2013  . Mitral valve prolapse 06/14/2013  . Chest pain, exertional 09/13/2012  .  Pernicious anemia 09/07/2012  . DIVERTICULOSIS, COLON 09/06/2009  . Osteoporosis 09/06/2009  . Hyperlipidemia 09/04/2009  . PERIPHERAL NEUROPATHY 09/04/2009  . LOW BACK PAIN, CHRONIC 04/08/2007   Past Medical History:  Diagnosis Date  . Arthritis    DJD, back and both hips  . Chronic low back pain without sciatica    DDD + scoliosis.  As of 12/04/15, Dr. Nelva Bush will do a back injection and follow her up in 1 mo.  . Diverticulosis    on colonoscopy 2004  . GERD (gastroesophageal reflux disease)   . Hx of cardiovascular stress test    Lexiscan Myoview (2/14):  Low risk, small ant defect likely breast attenuation, no ischemia, EF 69% (no change from 2010).    . Hyperlipidemia   . Mitral regurgitation    Echo (2/14):  EF 60-65%, Gr 1 DD, mild AI, mild bileaflet MVP, mod post directed MR, mild LAE, PASP 35.  Marland Kitchen MVP (mitral valve prolapse)   . Osteoporosis, senile    stable at the left hip 2014-2015.  Took fosamax for 10 yrs.  Plan: get repeat DEXA q73mo.  Marland Kitchen PAF (paroxysmal atrial fibrillation) ([facility])    Dr. Martinique: Flecainide, metopr, apxiaban.  . Peripheral neuropathy ([place]) 2008   Idiopathic vs familial (motor>sensory): gabapentin helpful  . Scoliosis   . Shoulder pain left   RC surg 09/2014    Past Surgical History:  Procedure Laterality Date  . BREAST SURGERY  Nov 2010  Benign biopsy, right  . CHOLECYSTECTOMY  03/2010   Dr.Gross  . COLONOSCOPY  10/03/2002   No polyps.  Repeat 10 yrs (Dr. Olevia Perches)  . COMBINED HYSTERECTOMY VAGINAL / OOPHORECTOMY / A&P REPAIR  0000000   uncertain if ovaries were removed or not  . CYSTOCELE REPAIR    . Lumpectomy  06/2009   "Fatty Necrosis"  . PFT's  2014   NORMAL  . ROTATOR CUFF REPAIR Left 10/02/14   Dr. Veverly Fells  . TONSILLECTOMY    . TOTAL KNEE ARTHROPLASTY  09/2009   Right ;Dr Alvan Dame  . TRANSTHORACIC ECHOCARDIOGRAM  09/2012   EF 60-65%, Gr 1 DD, mild AI, mild bileaflet MVP, mod post directed MR, mild LAE, PASP 35.  Marland Kitchen VAGINAL HYSTERECTOMY     For  Uterine Deviation     No prescriptions prior to admission.   Allergies  Allergen Reactions  . Sulfa Antibiotics Swelling    face    Social History  Substance Use Topics  . Smoking status: Never Smoker  . Smokeless tobacco: Never Used  . Alcohol use No    Family History  Problem Relation Age of Onset  . Heart attack Father     in 61s  . Stroke Father 57  . Ovarian cancer Mother   . Coronary artery disease Brother   . Heart attack Son 5    Smoker  . Diabetes Neg Hx      Review of Systems  Constitutional: Negative.   HENT: Negative.   Eyes: Negative.   Respiratory: Negative.   Cardiovascular: Negative.   Gastrointestinal: Positive for heartburn.  Genitourinary: Negative.   Musculoskeletal: Positive for back pain and joint pain.  Skin: Negative.   Neurological: Negative.   Endo/Heme/Allergies: Negative.   Psychiatric/Behavioral: Negative.     Objective:  Physical Exam  Constitutional: She is oriented to person, place, and time. She appears well-developed.  HENT:  Head: Normocephalic.  Eyes: Pupils are equal, round, and reactive to light.  Neck: Neck supple. No JVD present. No tracheal deviation present. No thyromegaly present.  Cardiovascular: Normal rate, regular rhythm, normal heart sounds and intact distal pulses.   Respiratory: Effort normal and breath sounds normal. No respiratory distress. She has no wheezes.  GI: Soft. There is no tenderness. There is no guarding.  Musculoskeletal:       Left knee: She exhibits decreased range of motion, swelling and bony tenderness. She exhibits no ecchymosis, no deformity, no laceration and no erythema. Tenderness found.  Lymphadenopathy:    She has no cervical adenopathy.  Neurological: She is alert and oriented to person, place, and time. A sensory deficit (bilateral LE neuropathy) is present.  Skin: Skin is warm and dry.  Psychiatric: She has a normal mood and affect.     Labs:   Estimated body mass index is  27.78 kg/m as calculated from the following:   Height as of 01/10/16: 5\' 1"  (1.549 m).   Weight as of 01/10/16: 66.7 kg (147 lb).   Imaging Review Plain radiographs demonstrate severe degenerative joint disease of the left knee(s).  The bone quality appears to be good for age and reported activity level.  Assessment/Plan:  End stage arthritis, left knee   The patient history, physical examination, clinical judgment of the provider and imaging studies are consistent with end stage degenerative joint disease of the left knee(s) and total knee arthroplasty is deemed medically necessary. The treatment options including medical management, injection therapy arthroscopy and arthroplasty were discussed at length. The risks  and benefits of total knee arthroplasty were presented and reviewed. The risks due to aseptic loosening, infection, stiffness, patella tracking problems, thromboembolic complications and other imponderables were discussed. The patient acknowledged the explanation, agreed to proceed with the plan and consent was signed. Patient is being admitted for inpatient treatment for surgery, pain control, PT, OT, prophylactic antibiotics, VTE prophylaxis, progressive ambulation and ADL's and discharge planning. The patient is planning to be discharged home.    [place] Renel Ende   PA-C  03/03/2016, 7:46 AM

## 2016-03-05 DIAGNOSIS — H52223 Regular astigmatism, bilateral: Secondary | ICD-10-CM | POA: Diagnosis not present

## 2016-03-05 DIAGNOSIS — H524 Presbyopia: Secondary | ICD-10-CM | POA: Diagnosis not present

## 2016-03-05 DIAGNOSIS — H5203 Hypermetropia, bilateral: Secondary | ICD-10-CM | POA: Diagnosis not present

## 2016-03-05 DIAGNOSIS — H25813 Combined forms of age-related cataract, bilateral: Secondary | ICD-10-CM | POA: Diagnosis not present

## 2016-03-10 ENCOUNTER — Inpatient Hospital Stay (HOSPITAL_COMMUNITY)
Admission: RE | Admit: 2016-03-10 | Discharge: 2016-03-11 | DRG: 470 | Disposition: A | Discharge status: Home Health | Payer: Medicare Other | Source: Ambulatory Visit | Attending: Orthopedic Surgery | Admitting: Orthopedic Surgery

## 2016-03-10 ENCOUNTER — Encounter (HOSPITAL_COMMUNITY)
Admission: RE | Disposition: A | Discharge status: Home Health | Payer: Self-pay | Source: Ambulatory Visit | Attending: Orthopedic Surgery

## 2016-03-10 ENCOUNTER — Inpatient Hospital Stay (HOSPITAL_COMMUNITY): Payer: Medicare Other | Admitting: Anesthesiology

## 2016-03-10 ENCOUNTER — Encounter (HOSPITAL_COMMUNITY): Payer: Self-pay | Admitting: *Deleted

## 2016-03-10 DIAGNOSIS — M25562 Pain in left knee: Secondary | ICD-10-CM | POA: Diagnosis not present

## 2016-03-10 DIAGNOSIS — M1712 Unilateral primary osteoarthritis, left knee: Secondary | ICD-10-CM | POA: Diagnosis not present

## 2016-03-10 DIAGNOSIS — M659 Synovitis and tenosynovitis, unspecified: Secondary | ICD-10-CM | POA: Diagnosis present

## 2016-03-10 DIAGNOSIS — Z9049 Acquired absence of other specified parts of digestive tract: Secondary | ICD-10-CM | POA: Diagnosis not present

## 2016-03-10 DIAGNOSIS — I10 Essential (primary) hypertension: Secondary | ICD-10-CM | POA: Diagnosis present

## 2016-03-10 DIAGNOSIS — K219 Gastro-esophageal reflux disease without esophagitis: Secondary | ICD-10-CM | POA: Diagnosis present

## 2016-03-10 DIAGNOSIS — Z8249 Family history of ischemic heart disease and other diseases of the circulatory system: Secondary | ICD-10-CM | POA: Diagnosis not present

## 2016-03-10 DIAGNOSIS — G629 Polyneuropathy, unspecified: Secondary | ICD-10-CM | POA: Diagnosis present

## 2016-03-10 DIAGNOSIS — I482 Chronic atrial fibrillation: Secondary | ICD-10-CM | POA: Diagnosis present

## 2016-03-10 DIAGNOSIS — E785 Hyperlipidemia, unspecified: Secondary | ICD-10-CM | POA: Diagnosis present

## 2016-03-10 DIAGNOSIS — M810 Age-related osteoporosis without current pathological fracture: Secondary | ICD-10-CM | POA: Diagnosis present

## 2016-03-10 DIAGNOSIS — Z96659 Presence of unspecified artificial knee joint: Secondary | ICD-10-CM

## 2016-03-10 DIAGNOSIS — M179 Osteoarthritis of knee, unspecified: Secondary | ICD-10-CM | POA: Diagnosis not present

## 2016-03-10 DIAGNOSIS — Z96652 Presence of left artificial knee joint: Secondary | ICD-10-CM

## 2016-03-10 HISTORY — PX: TOTAL KNEE ARTHROPLASTY: SHX125

## 2016-03-10 LAB — TYPE AND SCREEN
ABO/RH(D): O POS
Antibody Screen: NEGATIVE

## 2016-03-10 SURGERY — ARTHROPLASTY, KNEE, TOTAL
Anesthesia: Spinal | Site: Knee | Laterality: Left

## 2016-03-10 MED ORDER — PHENYLEPHRINE HCL 10 MG/ML IJ SOLN
INTRAMUSCULAR | Status: DC | PRN
  Administered 2016-03-10 (×8): 80 ug via INTRAVENOUS

## 2016-03-10 MED ORDER — BUPIVACAINE-EPINEPHRINE (PF) 0.25% -1:200000 IJ SOLN
INTRAMUSCULAR | Status: DC | PRN
  Administered 2016-03-10: 30 mL

## 2016-03-10 MED ORDER — SODIUM CHLORIDE 0.9 % IR SOLN
Status: DC | PRN
  Administered 2016-03-10: 1000 mL

## 2016-03-10 MED ORDER — DEXAMETHASONE SODIUM PHOSPHATE 10 MG/ML IJ SOLN
10.0000 mg | Freq: Once | INTRAMUSCULAR | Status: AC
  Administered 2016-03-10: 10 mg via INTRAVENOUS

## 2016-03-10 MED ORDER — SODIUM CHLORIDE 0.9 % IV SOLN
1000.0000 mg | INTRAVENOUS | Status: AC
  Administered 2016-03-10: 1000 mg via INTRAVENOUS
  Filled 2016-03-10: qty 10

## 2016-03-10 MED ORDER — FLECAINIDE ACETATE 50 MG PO TABS
50.0000 mg | ORAL_TABLET | Freq: Two times a day (BID) | ORAL | Status: DC
Start: 2016-03-10 — End: 2016-03-11
  Administered 2016-03-10 – 2016-03-11 (×2): 50 mg via ORAL
  Filled 2016-03-10 (×2): qty 1

## 2016-03-10 MED ORDER — 0.9 % SODIUM CHLORIDE (POUR BTL) OPTIME
TOPICAL | Status: DC | PRN
  Administered 2016-03-10: 1000 mL

## 2016-03-10 MED ORDER — ONDANSETRON HCL 4 MG/2ML IJ SOLN
4.0000 mg | Freq: Four times a day (QID) | INTRAMUSCULAR | Status: DC | PRN
  Administered 2016-03-10: 4 mg via INTRAVENOUS
  Filled 2016-03-10: qty 2

## 2016-03-10 MED ORDER — PROPOFOL 500 MG/50ML IV EMUL
INTRAVENOUS | Status: DC | PRN
  Administered 2016-03-10: 30 mg via INTRAVENOUS

## 2016-03-10 MED ORDER — STERILE WATER FOR IRRIGATION IR SOLN
Status: DC | PRN
  Administered 2016-03-10: 2000 mL

## 2016-03-10 MED ORDER — PROPOFOL 500 MG/50ML IV EMUL
INTRAVENOUS | Status: DC | PRN
  Administered 2016-03-10: 50 ug/kg/min via INTRAVENOUS

## 2016-03-10 MED ORDER — METOPROLOL TARTRATE 5 MG/5ML IV SOLN
2.5000 mg | INTRAVENOUS | Status: DC | PRN
  Administered 2016-03-10 (×2): 2.5 mg via INTRAVENOUS

## 2016-03-10 MED ORDER — BUPIVACAINE HCL (PF) 0.75 % IJ SOLN
INTRAMUSCULAR | Status: DC | PRN
  Administered 2016-03-10: 1.6 mL via INTRATHECAL

## 2016-03-10 MED ORDER — PROMETHAZINE HCL 25 MG/ML IJ SOLN: 6.2500 mg | INTRAMUSCULAR | Status: DC | PRN

## 2016-03-10 MED ORDER — FENTANYL CITRATE (PF) 100 MCG/2ML IJ SOLN
INTRAMUSCULAR | Status: AC
  Administered 2016-03-10: 50 ug via INTRAVENOUS
  Filled 2016-03-10: qty 2

## 2016-03-10 MED ORDER — SODIUM CHLORIDE 0.9 % IJ SOLN
INTRAMUSCULAR | Status: DC | PRN
  Administered 2016-03-10: 29 mL

## 2016-03-10 MED ORDER — BUPIVACAINE-EPINEPHRINE (PF) 0.25% -1:200000 IJ SOLN
INTRAMUSCULAR | Status: AC
  Filled 2016-03-10: qty 30

## 2016-03-10 MED ORDER — MAGNESIUM CITRATE PO SOLN: 1.0000 | Freq: Once | ORAL | Status: DC | PRN

## 2016-03-10 MED ORDER — HYDROCODONE-ACETAMINOPHEN 7.5-325 MG PO TABS
1.0000 | ORAL_TABLET | ORAL | Status: DC
  Administered 2016-03-10 – 2016-03-11 (×4): 1 via ORAL
  Filled 2016-03-10 (×4): qty 1

## 2016-03-10 MED ORDER — CEFAZOLIN SODIUM-DEXTROSE 2-4 GM/100ML-% IV SOLN
INTRAVENOUS | Status: AC
  Filled 2016-03-10: qty 100

## 2016-03-10 MED ORDER — METHOCARBAMOL 1000 MG/10ML IJ SOLN
500.0000 mg | Freq: Four times a day (QID) | INTRAVENOUS | Status: DC | PRN
  Administered 2016-03-10: 500 mg via INTRAVENOUS
  Filled 2016-03-10: qty 550
  Filled 2016-03-10: qty 5

## 2016-03-10 MED ORDER — FENTANYL CITRATE (PF) 100 MCG/2ML IJ SOLN
INTRAMUSCULAR | Status: AC
  Filled 2016-03-10: qty 2

## 2016-03-10 MED ORDER — DEXAMETHASONE SODIUM PHOSPHATE 10 MG/ML IJ SOLN
10.0000 mg | Freq: Once | INTRAMUSCULAR | Status: AC
Start: 2016-03-11 — End: 2016-03-11
  Administered 2016-03-11: 10 mg via INTRAVENOUS
  Filled 2016-03-10: qty 1

## 2016-03-10 MED ORDER — SODIUM CHLORIDE 0.9 % IV SOLN
INTRAVENOUS | Status: DC
  Administered 2016-03-10: 18:00:00 via INTRAVENOUS

## 2016-03-10 MED ORDER — MENTHOL 3 MG MT LOZG: 1.0000 | LOZENGE | OROMUCOSAL | Status: DC | PRN

## 2016-03-10 MED ORDER — FERROUS SULFATE 325 (65 FE) MG PO TABS
325.0000 mg | ORAL_TABLET | Freq: Three times a day (TID) | ORAL | Status: DC
  Administered 2016-03-10 – 2016-03-11 (×3): 325 mg via ORAL
  Filled 2016-03-10 (×3): qty 1

## 2016-03-10 MED ORDER — BISACODYL 10 MG RE SUPP: 10.0000 mg | Freq: Every day | RECTAL | Status: DC | PRN

## 2016-03-10 MED ORDER — ESMOLOL HCL 100 MG/10ML IV SOLN
INTRAVENOUS | Status: DC | PRN
  Administered 2016-03-10 (×4): 10 mg via INTRAVENOUS

## 2016-03-10 MED ORDER — SODIUM CHLORIDE 0.9 % IJ SOLN
INTRAMUSCULAR | Status: AC
  Filled 2016-03-10: qty 50

## 2016-03-10 MED ORDER — METOCLOPRAMIDE HCL 5 MG/ML IJ SOLN: 5.0000 mg | Freq: Three times a day (TID) | INTRAMUSCULAR | Status: DC | PRN

## 2016-03-10 MED ORDER — CHLORHEXIDINE GLUCONATE 4 % EX LIQD: 60.0000 mL | Freq: Once | CUTANEOUS | Status: DC

## 2016-03-10 MED ORDER — CEFAZOLIN SODIUM-DEXTROSE 2-4 GM/100ML-% IV SOLN
2.0000 g | INTRAVENOUS | Status: AC
  Administered 2016-03-10: 2 g via INTRAVENOUS

## 2016-03-10 MED ORDER — PHENOL 1.4 % MT LIQD: 1.0000 | OROMUCOSAL | Status: DC | PRN

## 2016-03-10 MED ORDER — KETOROLAC TROMETHAMINE 30 MG/ML IJ SOLN
INTRAMUSCULAR | Status: DC | PRN
  Administered 2016-03-10: 30 mg

## 2016-03-10 MED ORDER — HYDROMORPHONE HCL 1 MG/ML IJ SOLN
0.5000 mg | INTRAMUSCULAR | Status: DC | PRN
  Administered 2016-03-10: 1 mg via INTRAVENOUS
  Filled 2016-03-10: qty 1

## 2016-03-10 MED ORDER — PHENYLEPHRINE HCL 10 MG/ML IJ SOLN
INTRAMUSCULAR | Status: AC
  Filled 2016-03-10: qty 5

## 2016-03-10 MED ORDER — KETOROLAC TROMETHAMINE 30 MG/ML IJ SOLN
INTRAMUSCULAR | Status: AC
  Filled 2016-03-10: qty 1

## 2016-03-10 MED ORDER — METOCLOPRAMIDE HCL 5 MG PO TABS: 5.0000 mg | ORAL_TABLET | Freq: Three times a day (TID) | ORAL | Status: DC | PRN

## 2016-03-10 MED ORDER — LACTATED RINGERS IV SOLN: INTRAVENOUS | Status: DC

## 2016-03-10 MED ORDER — ONDANSETRON HCL 4 MG PO TABS: 4.0000 mg | ORAL_TABLET | Freq: Four times a day (QID) | ORAL | Status: DC | PRN

## 2016-03-10 MED ORDER — PHENYLEPHRINE HCL 10 MG/ML IJ SOLN
INTRAVENOUS | Status: DC | PRN
  Administered 2016-03-10: 20 ug/min via INTRAVENOUS

## 2016-03-10 MED ORDER — FENTANYL CITRATE (PF) 100 MCG/2ML IJ SOLN
25.0000 ug | INTRAMUSCULAR | Status: DC | PRN
  Administered 2016-03-10 (×3): 50 ug via INTRAVENOUS

## 2016-03-10 MED ORDER — APIXABAN 2.5 MG PO TABS
2.5000 mg | ORAL_TABLET | Freq: Two times a day (BID) | ORAL | Status: DC
  Administered 2016-03-11: 2.5 mg via ORAL
  Filled 2016-03-10: qty 1

## 2016-03-10 MED ORDER — DOCUSATE SODIUM 100 MG PO CAPS
100.0000 mg | ORAL_CAPSULE | Freq: Two times a day (BID) | ORAL | Status: DC
  Administered 2016-03-10 – 2016-03-11 (×2): 100 mg via ORAL
  Filled 2016-03-10 (×2): qty 1

## 2016-03-10 MED ORDER — DIPHENHYDRAMINE HCL 25 MG PO CAPS: 25.0000 mg | ORAL_CAPSULE | Freq: Four times a day (QID) | ORAL | Status: DC | PRN

## 2016-03-10 MED ORDER — POLYETHYLENE GLYCOL 3350 17 G PO PACK
17.0000 g | PACK | Freq: Two times a day (BID) | ORAL | Status: DC
  Administered 2016-03-11: 17 g via ORAL
  Filled 2016-03-10 (×2): qty 1

## 2016-03-10 MED ORDER — ALUM & MAG HYDROXIDE-SIMETH 200-200-20 MG/5ML PO SUSP: 30.0000 mL | ORAL | Status: DC | PRN

## 2016-03-10 MED ORDER — CEFAZOLIN SODIUM-DEXTROSE 2-4 GM/100ML-% IV SOLN
2.0000 g | Freq: Four times a day (QID) | INTRAVENOUS | Status: AC
  Administered 2016-03-10 – 2016-03-11 (×2): 2 g via INTRAVENOUS
  Filled 2016-03-10 (×3): qty 100

## 2016-03-10 MED ORDER — METOPROLOL TARTRATE 25 MG PO TABS
12.5000 mg | ORAL_TABLET | Freq: Two times a day (BID) | ORAL | Status: DC
  Administered 2016-03-10 – 2016-03-11 (×2): 12.5 mg via ORAL
  Filled 2016-03-10 (×2): qty 1

## 2016-03-10 MED ORDER — METHOCARBAMOL 500 MG PO TABS: 500.0000 mg | ORAL_TABLET | Freq: Four times a day (QID) | ORAL | Status: DC | PRN

## 2016-03-10 MED ORDER — LACTATED RINGERS IV SOLN
INTRAVENOUS | Status: DC
  Administered 2016-03-10 (×3): via INTRAVENOUS

## 2016-03-10 MED ORDER — FENTANYL CITRATE (PF) 100 MCG/2ML IJ SOLN
INTRAMUSCULAR | Status: DC | PRN
  Administered 2016-03-10: 100 ug via INTRAVENOUS

## 2016-03-10 MED ORDER — PROPOFOL 10 MG/ML IV BOLUS
INTRAVENOUS | Status: AC
  Filled 2016-03-10: qty 40

## 2016-03-10 MED ORDER — MEPERIDINE HCL 50 MG/ML IJ SOLN: 6.2500 mg | INTRAMUSCULAR | Status: DC | PRN

## 2016-03-10 MED ORDER — PRAVASTATIN SODIUM 20 MG PO TABS: 20.0000 mg | ORAL_TABLET | ORAL | Status: DC

## 2016-03-10 MED ORDER — TRANEXAMIC ACID 1000 MG/10ML IV SOLN
1000.0000 mg | Freq: Once | INTRAVENOUS | Status: AC
  Administered 2016-03-10: 1000 mg via INTRAVENOUS
  Filled 2016-03-10: qty 10

## 2016-03-10 MED ORDER — METOPROLOL TARTRATE 5 MG/5ML IV SOLN
INTRAVENOUS | Status: AC
Start: 2016-03-10 — End: 2016-03-10
  Administered 2016-03-10: 2.5 mg via INTRAVENOUS
  Filled 2016-03-10: qty 5

## 2016-03-10 MED ORDER — GABAPENTIN 300 MG PO CAPS
300.0000 mg | ORAL_CAPSULE | Freq: Three times a day (TID) | ORAL | Status: DC
  Administered 2016-03-10 – 2016-03-11 (×3): 300 mg via ORAL
  Filled 2016-03-10 (×3): qty 1

## 2016-03-10 SURGICAL SUPPLY — 43 items
BAG SPEC THK2 15X12 ZIP CLS (MISCELLANEOUS) ×1
BAG ZIPLOCK 12X15 (MISCELLANEOUS) ×1 IMPLANT
BANDAGE ACE 6X5 VEL STRL LF (GAUZE/BANDAGES/DRESSINGS) ×2 IMPLANT
BLADE SAW SGTL 13.0X1.19X90.0M (BLADE) ×2 IMPLANT
BOWL SMART MIX CTS (DISPOSABLE) ×2 IMPLANT
CAPT KNEE TOTAL 3 ATTUNE ×1 IMPLANT
CEMENT HV SMART SET (Cement) ×2 IMPLANT
CLOTH BEACON ORANGE TIMEOUT ST (SAFETY) ×2 IMPLANT
CUFF TOURN SGL QUICK 34 (TOURNIQUET CUFF) ×2
CUFF TRNQT CYL 34X4X40X1 (TOURNIQUET CUFF) ×1 IMPLANT
DECANTER SPIKE VIAL GLASS SM (MISCELLANEOUS) ×2 IMPLANT
DRAPE U-SHAPE 47X51 STRL (DRAPES) ×2 IMPLANT
DRESSING AQUACEL AG SP 3.5X10 (GAUZE/BANDAGES/DRESSINGS) ×1 IMPLANT
DRSG AQUACEL AG SP 3.5X10 (GAUZE/BANDAGES/DRESSINGS) ×2
DURAPREP 26ML APPLICATOR (WOUND CARE) ×4 IMPLANT
ELECT REM PT RETURN 9FT ADLT (ELECTROSURGICAL) ×2
ELECTRODE REM PT RTRN 9FT ADLT (ELECTROSURGICAL) ×1 IMPLANT
GLOVE BIOGEL PI IND STRL 7.0 (GLOVE) IMPLANT
GLOVE BIOGEL PI IND STRL 7.5 (GLOVE) ×1 IMPLANT
GLOVE BIOGEL PI IND STRL 8.5 (GLOVE) ×1 IMPLANT
GLOVE BIOGEL PI INDICATOR 7.0 (GLOVE) ×4
GLOVE BIOGEL PI INDICATOR 7.5 (GLOVE) ×3
GLOVE BIOGEL PI INDICATOR 8.5 (GLOVE) ×1
GLOVE ECLIPSE 8.0 STRL XLNG CF (GLOVE) ×2 IMPLANT
GLOVE ORTHO TXT STRL SZ7.5 (GLOVE) ×4 IMPLANT
GOWN STRL REUS W/TWL LRG LVL3 (GOWN DISPOSABLE) ×3 IMPLANT
GOWN STRL REUS W/TWL XL LVL3 (GOWN DISPOSABLE) ×4 IMPLANT
HANDPIECE INTERPULSE COAX TIP (DISPOSABLE) ×2
LIQUID BAND (GAUZE/BANDAGES/DRESSINGS) ×2 IMPLANT
MANIFOLD NEPTUNE II (INSTRUMENTS) ×2 IMPLANT
PACK TOTAL KNEE CUSTOM (KITS) ×2 IMPLANT
POSITIONER SURGICAL ARM (MISCELLANEOUS) ×2 IMPLANT
SET HNDPC FAN SPRY TIP SCT (DISPOSABLE) ×1 IMPLANT
SET PAD KNEE POSITIONER (MISCELLANEOUS) ×2 IMPLANT
SUT MNCRL AB 4-0 PS2 18 (SUTURE) ×2 IMPLANT
SUT VIC AB 1 CT1 36 (SUTURE) ×2 IMPLANT
SUT VIC AB 2-0 CT1 27 (SUTURE) ×6
SUT VIC AB 2-0 CT1 TAPERPNT 27 (SUTURE) ×3 IMPLANT
SUT VLOC 180 0 24IN GS25 (SUTURE) ×2 IMPLANT
SYR 50ML LL SCALE MARK (SYRINGE) ×2 IMPLANT
TRAY FOLEY W/METER SILVER 14FR (SET/KITS/TRAYS/PACK) ×2 IMPLANT
WRAP KNEE MAXI GEL POST OP (GAUZE/BANDAGES/DRESSINGS) ×2 IMPLANT
YANKAUER SUCT BULB TIP 10FT TU (MISCELLANEOUS) ×2 IMPLANT

## 2016-03-10 NOTE — Anesthesia Preprocedure Evaluation (Addendum)
Anesthesia Evaluation  Patient identified by MRN, date of birth, ID band Patient awake    Reviewed: Allergy & Precautions, NPO status , Patient's Chart, lab work & pertinent test results  History of Anesthesia Complications (+) PONV and history of anesthetic complications  Airway Mallampati: II  TM Distance: >3 FB Neck ROM: Full    Dental  (+) Teeth Intact, Dental Advisory Given   Pulmonary neg pulmonary ROS,    breath sounds clear to auscultation       Cardiovascular hypertension, Pt. on home beta blockers + dysrhythmias Atrial Fibrillation + Valvular Problems/Murmurs MR  Rhythm:Irregular Rate:Abnormal     Neuro/Psych  Neuromuscular disease negative psych ROS   GI/Hepatic Neg liver ROS, GERD  ,  Endo/Other  negative endocrine ROS  Renal/GU negative Renal ROS  negative genitourinary   Musculoskeletal  (+) Arthritis , Osteoarthritis,    Abdominal   Peds negative pediatric ROS (+)  Hematology negative hematology ROS (+)   Anesthesia Other Findings   Reproductive/Obstetrics negative OB ROS                          Lab Results  Component Value Date   WBC 5.6 03/03/2016   HGB 13.7 03/03/2016   HCT 42.0 03/03/2016   MCV 91.7 03/03/2016   PLT 182 03/03/2016   Lab Results  Component Value Date   CREATININE 0.70 03/03/2016   BUN 13 03/03/2016   NA 139 03/03/2016   K 4.8 03/03/2016   CL 107 03/03/2016   CO2 27 03/03/2016   Lab Results  Component Value Date   INR 1.06 03/03/2016   INR 0.89 09/19/2010   INR 0.96 09/20/2009   10/2015 EKG: sinus bradycardia, 1st degree AV block.  09/2015 Echo - Left ventricle: The cavity size was normal. There was mild   concentric hypertrophy. Systolic function was normal. The   estimated ejection fraction was in the range of 55% to 60%. - Aortic valve: There was mild regurgitation. - Mitral valve: Mild anterior leafelt prolapse. There was mild  regurgitation. - Atrial septum: No defect or patent foramen ovale was identified. - Pulmonary arteries: PA peak pressure: 36 mm Hg (S).  Anesthesia Physical Anesthesia Plan  ASA: III  Anesthesia Plan: Spinal   Post-op Pain Management:    Induction: Intravenous  Airway Management Planned: Natural Airway  Additional Equipment:   Intra-op Plan: Utilization Of Total Body Hypothermia per surgeon request  Post-operative Plan:   Informed Consent: I have reviewed the patients History and Physical, chart, labs and discussed the procedure including the risks, benefits and alternatives for the proposed anesthesia with the patient or authorized representative who has indicated his/her understanding and acceptance.     Plan Discussed with: CRNA  Anesthesia Plan Comments: (Apixiban over 3 days ago per patient. )       Anesthesia Quick Evaluation

## 2016-03-10 NOTE — Anesthesia Postprocedure Evaluation (Signed)
Anesthesia Post Note  Patient: Cassie White  Procedure(s) Performed: Procedure(s) (LRB): TOTAL KNEE ARTHROPLASTY (Left)  Patient location during evaluation: PACU Anesthesia Type: Spinal Level of consciousness: oriented and awake and alert Pain management: pain level controlled Vital Signs Assessment: post-procedure vital signs reviewed and stable Respiratory status: spontaneous breathing, respiratory function stable and patient connected to nasal cannula oxygen Cardiovascular status: blood pressure returned to baseline and stable Postop Assessment: no headache, no backache and spinal receding Anesthetic complications: no    Last Vitals:  Vitals:   03/10/16 1544 03/10/16 1545  BP:  124/85  Pulse: 97 94  Resp: 18 20  Temp:  36.4 C    Last Pain:  Vitals:   03/10/16 1530  TempSrc:   PainSc: Branch Samin Milke

## 2016-03-10 NOTE — Anesthesia Procedure Notes (Signed)
Spinal  Start time: 03/10/2016 12:59 PM End time: 03/10/2016 1:04 PM Staffing Resident/CRNA: Herbie Drape J Preanesthetic Checklist Completed: patient identified, site marked, surgical consent, pre-op evaluation, timeout performed, IV checked, risks and benefits discussed and monitors and equipment checked Spinal Block Patient position: sitting Prep: Betadine and site prepped and draped Patient monitoring: heart rate, continuous pulse ox and blood pressure Approach: midline Location: L3-4 Injection technique: single-shot Needle Needle type: Spinocan  Needle gauge: 22 G Needle length: 9 cm Needle insertion depth: 7 cm Additional Notes Sterile prep and drape, negative paresthesia, negative heme.

## 2016-03-10 NOTE — Transfer of Care (Signed)
Immediate Anesthesia Transfer of Care Note  Patient: Cassie White  Procedure(s) Performed: Procedure(s): TOTAL KNEE ARTHROPLASTY (Left)  Patient Location: PACU  Anesthesia Type:Spinal  Level of Consciousness: awake, alert  and oriented  Airway & Oxygen Therapy: Patient Spontanous Breathing and Patient connected to face mask oxygen  Post-op Assessment: Report given to RN and Post -op Vital signs reviewed and stable  Post vital signs: Reviewed and stable  Last Vitals:  Vitals:   03/10/16 1128  BP: (!) 144/54  Pulse: (!) 58  Resp: 18  Temp: 36.4 C    Last Pain:  Vitals:   03/10/16 1128  TempSrc: Oral         Complications: No apparent anesthesia complications

## 2016-03-10 NOTE — Op Note (Signed)
NAME:  Cassie White                      MEDICAL RECORD NO.:  [id number]                             FACILITY:  [facility]      PHYSICIAN:  Pietro Cassis. Alvan Dame, M.D.  DATE OF BIRTH:  1941/07/04      DATE OF PROCEDURE:  03/10/2016                                     OPERATIVE REPORT         PREOPERATIVE DIAGNOSIS:  Left knee osteoarthritis.      POSTOPERATIVE DIAGNOSIS:  Left knee osteoarthritis.      FINDINGS:  The patient was noted to have complete loss of cartilage and   bone-on-bone arthritis with associated osteophytes in the patellofemoral and lateral compartments of   the knee with a significant synovitis and associated effusion.      PROCEDURE:  Left total knee replacement.      COMPONENTS USED:  DePuy Attune rotating platform posterior stabilized knee   system, a size 4N femur, 3 tibia, size 8 mm PS AOX insert, and 32 anatomic patellar   button.      SURGEON:  Pietro Cassis. Alvan Dame, M.D.      ASSISTANT:  Danae Orleans, PA-C.      ANESTHESIA:  Spinal.      SPECIMENS:  None.      COMPLICATION:  None.      DRAINS:  None.  EBL: <50cc      TOURNIQUET TIME:   Total Tourniquet Time Documented: Thigh (Left) - 24 minutes Total: Thigh (Left) - 24 minutes  .      The patient was stable to the recovery room.      INDICATION FOR PROCEDURE:  Cassie White is a 75 y.o. female patient of   mine.  The patient had been seen, evaluated, and treated conservatively in the   office with medication, activity modification, and injections.  The patient had   radiographic changes of bone-on-bone arthritis with endplate sclerosis and osteophytes noted.      The patient failed conservative measures including medication, injections, and activity modification, and at this point was ready for more definitive measures.   Based on the radiographic changes and failed conservative measures, the patient   decided to proceed with total knee replacement.  Risks of infection,   DVT, component failure,  need for revision surgery, postop course, and   expectations were all   discussed and reviewed.  Consent was obtained for benefit of pain   relief.      PROCEDURE IN DETAIL:  The patient was brought to the operative theater.   Once adequate anesthesia, preoperative antibiotics, 2 gm of Ancef, 1 gm of Tranexamic Acid, and 10 mg of Decaron administered, the patient was positioned supine with the left thigh tourniquet placed.  The  left lower extremity was prepped and draped in sterile fashion.  A time-   out was performed identifying the patient, planned procedure, and   extremity.      The left lower extremity was placed in the [facility] leg holder.  The leg was   exsanguinated, tourniquet elevated to 250 mmHg.  A midline incision was   made  followed by median parapatellar arthrotomy.  Following initial   exposure, attention was first directed to the patella.  Precut   measurement was noted to be 19 mm.  I resected down to 13 mm and used a   32 patellar button to restore patellar height as well as cover the cut   surface.      The lug holes were drilled and a metal shim was placed to protect the   patella from retractors and saw blades.      At this point, attention was now directed to the femur.  The femoral   canal was opened with a drill, irrigated to try to prevent fat emboli.  An   intramedullary rod was passed at 3 degrees valgus, 9 mm of bone was   resected off the distal femur.  Following this resection, the tibia was   subluxated anteriorly.  Using the extramedullary guide, 2 mm of bone was resected off   the proximal medial tibia.  We confirmed the gap would be   stable medially and laterally with a size 6 mm insert as well as confirmed   the cut was perpendicular in the coronal plane, checking with an alignment rod.      Once this was done, I sized the femur to be a size 4 in the anterior-   posterior dimension, chose a narrow component based on medial and   lateral dimension.   The size 4 rotation block was then pinned in   position anterior referenced using the C-clamp to set rotation.  The   anterior, posterior, and  chamfer cuts were made without difficulty nor   notching making certain that I was along the anterior cortex to help   with flexion gap stability.      The final box cut was made off the lateral aspect of distal femur.      At this point, the tibia was sized to be a size 3, the size 3 tray was   then pinned in position through the medial third of the tubercle,   drilled, and keel punched.  Trial reduction was now carried with a 4 femur,  3 tibia, a size 8 mm insert, and the 32 anatomic patella botton.  The knee was brought to   extension, full extension with good flexion stability with the patella   tracking through the trochlea without application of pressure.  Given   all these findings the femoral lug holes were drilled and then the trial components removed.  Final components were   opened and cement was mixed.  The knee was irrigated with normal saline   solution and pulse lavage.  The synovial lining was   then injected with 30 cc of 0.25% Marcaine with epinephrine and 1 cc of Toradol plus 30 cc of NS for a total of 61 cc.      The knee was irrigated.  Final implants were then cemented onto clean and   dried cut surfaces of bone with the knee brought to extension with a size 8   mm trial insert.      Once the cement had fully cured, the excess cement was removed   throughout the knee.  I confirmed I was satisfied with the range of   motion and stability, and the final size 8 mm PS AOX insert was chosen.  It was   placed into the knee.      The tourniquet had been let down at 24 minutes.  No significant   hemostasis required.  The   extensor mechanism was then reapproximated using #1 Vicryl and #0 V-lock sutures with the knee   in flexion.  The   remaining wound was closed with 2-0 Vicryl and running 4-0 Monocryl.   The knee was cleaned,  dried, dressed sterilely using Dermabond and   Aquacel dressing.  The patient was then   brought to recovery room in stable condition, tolerating the procedure   well.   Please note that Physician Assistant, Danae Orleans, PA-C, was present for the entirety of the case, and was utilized for pre-operative positioning, peri-operative retractor management, general facilitation of the procedure.  He was also utilized for primary wound closure at the end of the case.              Pietro Cassis Alvan Dame, M.D.    03/10/2016 2:14 PM

## 2016-03-10 NOTE — Discharge Instructions (Signed)

## 2016-03-11 LAB — BASIC METABOLIC PANEL
ANION GAP: 6 (ref 5–15)
BUN: 10 mg/dL (ref 6–20)
CALCIUM: 9 mg/dL (ref 8.9–10.3)
CO2: 23 mmol/L (ref 22–32)
Chloride: 110 mmol/L (ref 101–111)
Creatinine, Ser: 0.71 mg/dL (ref 0.44–1.00)
GFR calc Af Amer: >60 mL/min (ref >60)
GFR calc non Af Amer: >60 mL/min (ref >60)
GLUCOSE: 159 mg/dL — AB (ref 65–99)
Potassium: 4.4 mmol/L (ref 3.5–5.1)
Sodium: 139 mmol/L (ref 135–145)

## 2016-03-11 LAB — CBC
HEMATOCRIT: 33.9 % — AB (ref 36.0–46.0)
Hemoglobin: 11.2 g/dL — ABNORMAL LOW (ref 12.0–15.0)
MCH: 29.9 pg (ref 26.0–34.0)
MCHC: 33 g/dL (ref 30.0–36.0)
MCV: 90.4 fL (ref 78.0–100.0)
Platelets: 167 10*3/uL (ref 150–400)
RBC: 3.75 MIL/uL — ABNORMAL LOW (ref 3.87–5.11)
RDW: 13.5 % (ref 11.5–15.5)
WBC: 10.3 10*3/uL (ref 4.0–10.5)

## 2016-03-11 MED ORDER — DOCUSATE SODIUM 100 MG PO CAPS
100.0000 mg | ORAL_CAPSULE | Freq: Two times a day (BID) | ORAL | 0 refills | Status: DC
Start: 2016-03-11 — End: 2016-11-21

## 2016-03-11 MED ORDER — TIZANIDINE HCL 4 MG PO TABS: 4.0000 mg | ORAL_TABLET | Freq: Four times a day (QID) | ORAL | 0 refills | Status: DC | PRN

## 2016-03-11 MED ORDER — POLYETHYLENE GLYCOL 3350 17 G PO PACK: 17.0000 g | PACK | Freq: Two times a day (BID) | ORAL | 0 refills | Status: DC

## 2016-03-11 MED ORDER — HYDROCODONE-ACETAMINOPHEN 7.5-325 MG PO TABS: 1.0000 | ORAL_TABLET | ORAL | 0 refills | Status: DC | PRN

## 2016-03-11 MED ORDER — FERROUS SULFATE 325 (65 FE) MG PO TABS: 325.0000 mg | ORAL_TABLET | Freq: Three times a day (TID) | ORAL | 3 refills | Status: DC

## 2016-03-11 NOTE — Care Management Note (Signed)
Case Management Note  Patient Details  Name: Cassie White MRN: [id number] Date of Birth: 03/27/1941  Subjective/Objective:  75 y/o f admitted for L TKA. PT-recc HHPT. Patient followed by [facility] CM-d/c plan-otpt PT-already set up by [facility]-ortho CM.                  Action/Plan:d/c home no needs.   Expected Discharge Date:                  Expected Discharge Plan:  Home/Self Care  In-House Referral:     Discharge planning Services  CM Consult  Post Acute Care Choice:    Choice offered to:     DME Arranged:    DME Agency:     HH Arranged:    [place] Agency:     Status of Service:  Completed, signed off  If discussed at [employer] of Stay Meetings, dates discussed:    Additional Comments:  Dessa Phi, RN 03/11/2016, 2:10 PM

## 2016-03-11 NOTE — Evaluation (Signed)
Physical Therapy Evaluation Patient Details Name: Cassie White MRN: [id number] DOB: 09-24-1940 Today's Date: 03/11/2016   History of Present Illness  Pt is a 75 year old female s/p L TKA with hx of R TKA  Clinical Impression  Pt is s/p L TKA resulting in the deficits listed below (see PT Problem List).  Pt will benefit from skilled PT to increase their independence and safety with mobility to allow discharge to the venue listed below.  Pt with limited ambulation morning of POD #1 due to pain and nausea.  Pt plans to d/c home with assist from her daughter.     Follow Up Recommendations Home health PT;Supervision for mobility/OOB    Equipment Recommendations  None recommended by PT    Recommendations for Other Services       Precautions / Restrictions Precautions Precautions: Fall;Knee Restrictions Other Position/Activity Restrictions: WBAT      Mobility  Bed Mobility Overal bed mobility: Needs Assistance Bed Mobility: Supine to Sit     Supine to sit: Min guard;HOB elevated     General bed mobility comments: assist for L LE  Transfers Overall transfer level: Needs assistance Equipment used: Rolling walker (2 wheeled) Transfers: Sit to/from Stand Sit to Stand: Min assist         General transfer comment: assist to rise and steady  Ambulation/Gait Ambulation/Gait assistance: Min guard Ambulation Distance (Feet): 25 Feet Assistive device: Rolling walker (2 wheeled) Gait Pattern/deviations: Step-to pattern;Decreased stance time - left;Antalgic     General Gait Details: verbal cues for sequence, RW positioning, step length, posture, distance limited by nausea and pain  Stairs            Wheelchair Mobility    Modified Rankin (Stroke Patients Only)       Balance                                             Pertinent Vitals/Pain Pain Assessment: 0-10 Pain Score: 3  Pain Location: L knee Pain Descriptors / Indicators:  Aching;Sore Pain Intervention(s): Limited activity within patient's tolerance;Monitored during session;Premedicated before session;Repositioned    Home Living Family/patient expects to be discharged to:: Private residence Living Arrangements: Alone Available Help at Discharge: Family;Available 24 hours/day (daughter) Type of Home: House Home Access: Stairs to enter   [employer] of Steps: 3 Home Layout: One level Home Equipment: Walker - 2 wheels      Prior Function Level of Independence: Independent               Hand Dominance        Extremity/Trunk Assessment               Lower Extremity Assessment: LLE deficits/detail   LLE Deficits / Details: good quad contraction, ROM TBA     Communication   Communication: No difficulties  Cognition Arousal/Alertness: Awake/alert Behavior During Therapy: WFL for tasks assessed/performed Overall Cognitive Status: Within Functional Limits for tasks assessed                      General Comments      Exercises        Assessment/Plan    PT Assessment Patient needs continued PT services  PT Diagnosis Difficulty walking;Acute pain   PT Problem List Decreased strength;Decreased range of motion;Decreased mobility;Pain;Decreased knowledge of use of DME  PT Treatment Interventions  Functional mobility training;Stair training;Gait training;DME instruction;Therapeutic exercise;Therapeutic activities;Patient/family education   PT Goals (Current goals can be found in the Care Plan section) Acute Rehab PT Goals PT Goal Formulation: With patient/family Time For Goal Achievement: 03/15/16 Potential to Achieve Goals: Good    Frequency 7X/week   Barriers to discharge        Co-evaluation               End of Session Equipment Utilized During Treatment: Gait belt Activity Tolerance: Patient limited by pain Patient left: Other (comment) (BSC with OT) Nurse Communication: Mobility status          Time: [id number] PT Time Calculation (min) (ACUTE ONLY): 14 min   Charges:   PT Evaluation $PT Eval Low Complexity: 1 Procedure     PT G Codes:        Karlee Staff,KATHrine E 03/11/2016, 1:46 PM Carmelia Bake, PT, DPT 03/11/2016 Pager: [phone]

## 2016-03-11 NOTE — Progress Notes (Signed)
Occupational Therapy Evaluation Patient Details Name: Cassie White MRN: [id number] DOB: [birth year] Today's Date: 03/11/2016    History of Present Illness Pt is a 75 year old female s/p L TKA with hx of R TKA   Clinical Impression   Patient presents to OT with decreased ADL independence and safety due to the deficits listed below. She will benefit from skilled OT to maximize function and to facilitate a safe discharge. OT will follow.    Follow Up Recommendations  No OT follow up;Supervision/Assistance - 24 hour    Equipment Recommendations  3 in 1 bedside comode    Recommendations for Other Services       Precautions / Restrictions Precautions Precautions: Fall;Knee Restrictions Other Position/Activity Restrictions: WBAT      Mobility Bed Mobility            General bed mobility comments: NT -- up with PT  Transfers Overall transfer level: Needs assistance Equipment used: Rolling walker (2 wheeled) Transfers: Sit to/from [employer] Sit to Stand: Min assist Stand pivot transfers: Min assist       General transfer comment: assist to rise and steady    Balance                                            ADL Overall ADL's : Needs assistance/impaired Eating/Feeding: Independent;Sitting   Grooming: Wash/dry hands;Set up;Sitting           Upper Body Dressing : Set up;Sitting   Lower Body Dressing: Moderate assistance;Sit to/from stand   Toilet Transfer: Minimal assistance;Stand-pivot;Ambulation;BSC;RW   Toileting- Clothing Manipulation and Hygiene: Supervision/safety;Sitting/lateral lean       Functional mobility during ADLs: Minimal assistance;Rolling walker       Vision     Perception     Praxis      Pertinent Vitals/Pain Pain Assessment: 0-10 Pain Score: 8  Pain Location: L knee with transfers Pain Descriptors / Indicators: Aching;Sore Pain Intervention(s): Limited activity within patient's  tolerance;Monitored during session;Repositioned     Hand Dominance     Extremity/Trunk Assessment Upper Extremity Assessment Upper Extremity Assessment: Overall WFL for tasks assessed   Lower Extremity Assessment Lower Extremity Assessment: Defer to PT evaluation LLE Deficits / Details: good quad contraction, ROM TBA       Communication Communication Communication: No difficulties   Cognition Arousal/Alertness: Awake/alert Behavior During Therapy: WFL for tasks assessed/performed Overall Cognitive Status: Within Functional Limits for tasks assessed                     General Comments       Exercises       Shoulder Instructions      Home Living Family/patient expects to be discharged to:: Private residence Living Arrangements: Alone Available Help at Discharge: Family;Available 24 hours/day Type of Home: House Home Access: Stairs to enter [employer] of Steps: 3   Home Layout: One level     Bathroom Shower/Tub: Occupational psychologist: Handicapped height     Home Equipment: Environmental consultant - 2 wheels;Toilet riser          Prior Functioning/Environment Level of Independence: Independent             OT Diagnosis: Acute pain   OT Problem List: Decreased strength;Decreased range of motion;Decreased activity tolerance;Decreased knowledge of use of DME or AE;Pain   OT  Treatment/Interventions: Self-care/ADL training;DME and/or AE instruction;Therapeutic activities;Patient/family education    OT Goals(Current goals can be found in the care plan section) Acute Rehab OT Goals OT Goal Formulation: With patient Time For Goal Achievement: 03/25/16 Potential to Achieve Goals: Good ADL Goals Pt Will Perform Lower Body Bathing: with supervision;sit to/from stand Pt Will Perform Lower Body Dressing: with min assist;sit to/from stand Pt Will Transfer to Toilet: with supervision;bedside commode;ambulating Pt Will Perform Toileting - Clothing  Manipulation and hygiene: with supervision;sit to/from stand Pt Will Perform Tub/Shower Transfer: Shower transfer;with min guard assist;ambulating;3 in 1;rolling walker  OT Frequency: Min 2X/week   Barriers to D/C:            Co-evaluation              End of Session Equipment Utilized During Treatment: Rolling walker  Activity Tolerance: Patient tolerated treatment well Patient left: in chair;with call bell/phone within reach;with chair alarm set;with family/visitor present   Time: [id number] OT Time Calculation (min): 14 min Charges:  OT General Charges $OT Visit: 1 Procedure OT Evaluation $OT Eval Low Complexity: 1 Procedure G-Codes:    Nirali Magouirk A 2016/04/02, 2:14 PM

## 2016-03-11 NOTE — Progress Notes (Signed)
Patient ID: Cassie White, female   DOB: October 26, 1940, 75 y.o.   MRN: [id number] Subjective: 1 Day Post-Op Procedure(s) (LRB): TOTAL KNEE ARTHROPLASTY (Left)    Patient reports pain as moderate.  Not much sleep last night, mainly due to commotion on unit and required nursing care.  Daughter spent night.  No events with regards to her chronic a-fib  Objective:   VITALS:   Vitals:   03/11/16 0001 03/11/16 0630  BP: (!) 112/46 (!) 101/41  Pulse: 65 60  Resp: 18 18  Temp: 97.7 F (36.5 C) 97.9 F (36.6 C)    Neurovascular intact Incision: dressing C/D/I  LABS  Recent Labs  03/11/16 0358  HGB 11.2*  HCT 33.9*  WBC 10.3  PLT 167     Recent Labs  03/11/16 0358  NA 139  K 4.4  BUN 10  CREATININE 0.71  GLUCOSE 159*    No results for input(s): LABPT, INR in the last 72 hours.   Assessment/Plan: 1 Day Post-Op Procedure(s) (LRB): TOTAL KNEE ARTHROPLASTY (Left)   Advance diet Up with therapy Discharge home with home health either today or tomorrow depending on functional progress with therapy

## 2016-03-11 NOTE — Discharge Summary (Signed)
Physician Discharge Summary  Patient ID: Cassie White MRN: [id number] DOB/AGE: 75-Feb-1942 75 y.o.  Admit date: 03/10/2016 Discharge date:  03/11/2016  Procedures:  Procedure(s) (LRB): TOTAL KNEE ARTHROPLASTY (Left)  Attending Physician:  Dr. Paralee Cancel   Admission Diagnoses:   Left knee primary OA / pain  Discharge Diagnoses:  Principal Problem:   S/P left TKA Active Problems:   S/P knee replacement  Past Medical History:  Diagnosis Date  . Arthritis    DJD, back and both hips  . Chronic low back pain without sciatica    DDD + scoliosis.  As of 12/04/15, Dr. Nelva Bush will do a back injection and follow her up in 1 mo.  . Diverticulosis    on colonoscopy 2004  . GERD (gastroesophageal reflux disease)   . Hx of cardiovascular stress test    Lexiscan Myoview (2/14):  Low risk, small ant defect likely breast attenuation, no ischemia, EF 69% (no change from 2010).    . Hyperlipidemia   . Hypertension    per pt  . Mitral regurgitation    Echo (2/14):  EF 60-65%, Gr 1 DD, mild AI, mild bileaflet MVP, mod post directed MR, mild LAE, PASP 35.  Marland Kitchen MVP (mitral valve prolapse)   . Osteoporosis, senile    stable at the left hip 2014-2015.  Took fosamax for 10 yrs.  Plan: get repeat DEXA q53mo.  Marland Kitchen PAF (paroxysmal atrial fibrillation) (HCC)    Dr. [place]: Flecainide, metopr, apxiaban.  . Peripheral neuropathy ([place]) 2008   Idiopathic vs familial (motor>sensory): gabapentin helpful  . PONV (postoperative nausea and vomiting)   . Scoliosis   . Shoulder pain left   RC surg 09/2014    HPI:    Cassie White, 75 y.o. female, has a history of pain and functional disability in the left knee due to arthritis and has failed non-surgical conservative treatments for greater than 12 weeks to include NSAID's and/or analgesics and activity modification.  Onset of symptoms was gradual, starting 6+ years ago with gradually worsening course since that time. The patient noted prior procedures on the  knee to include  arthroplasty on the right knee per Dr. Alvan Dame.  Patient currently rates pain in the left knee(s) at 8 out of 10 with activity. Patient has worsening of pain with activity and weight bearing, pain that interferes with activities of daily living, pain with passive range of motion, crepitus and joint swelling.  Patient has evidence of periarticular osteophytes and joint space narrowing by imaging studies.  There is no active infection.   Risks, benefits and expectations were discussed with the patient.  Risks including but not limited to the risk of anesthesia, blood clots, nerve damage, blood vessel damage, failure of the prosthesis, infection and up to and including death.  Patient understand the risks, benefits and expectations and wishes to proceed with surgery.   PCP: Tammi Sou, MD   Discharged Condition: good  Hospital Course:  Patient underwent the above stated procedure on 03/10/2016. Patient tolerated the procedure well and brought to the recovery room in good condition and subsequently to the floor.  POD #1 BP: 101/41 ; Pulse: 60 ; Temp: 97.9 F (36.6 C) ; Resp: 18 Patient reports pain as moderate.  Not much sleep last night, mainly due to commotion on unit and required nursing care.  Daughter spent night.  No events with regards to her chronic a-fib. Neurovascular intact and incision: dressing C/D/I  LABS  Basename    HGB  11.2  HCT     33.9    Discharge Exam: General appearance: alert, cooperative and no distress Extremities: Homans sign is negative, no sign of DVT, no edema, redness or tenderness in the calves or thighs and no ulcers, gangrene or trophic changes  Disposition: Home with follow up in 2 weeks   Follow-up Information    Mauri Pole, MD. Schedule an appointment as soon as possible for a visit in 2 week(s).   Specialty:  Orthopedic Surgery Contact information: [address] [place] [address] [phone]             Discharge Instructions    Call MD / Call 911    Complete by:  As directed   If you experience chest pain or shortness of breath, CALL 911 and be transported to the hospital emergency room.  If you develope a fever above 101 F, pus (white drainage) or increased drainage or redness at the wound, or calf pain, call your surgeon's office.   Change dressing    Complete by:  As directed   Maintain surgical dressing until follow up in the clinic. If the edges start to pull up, may reinforce with tape. If the dressing is no longer working, may remove and cover with gauze and tape, but must keep the area dry and clean.  Call with any questions or concerns.   Constipation Prevention    Complete by:  As directed   Drink plenty of fluids.  Prune juice may be helpful.  You may use a stool softener, such as Colace (over the counter) 100 mg twice a day.  Use MiraLax (over the counter) for constipation as needed.   Diet - low sodium heart healthy    Complete by:  As directed   Discharge instructions    Complete by:  As directed   Maintain surgical dressing until follow up in the clinic. If the edges start to pull up, may reinforce with tape. If the dressing is no longer working, may remove and cover with gauze and tape, but must keep the area dry and clean.  Follow up in 2 weeks at [facility]. Call with any questions or concerns.   Increase activity slowly as tolerated    Complete by:  As directed   Weight bearing as tolerated with assist device (walker, cane, etc) as directed, use it as long as suggested by your surgeon or therapist, typically at least 4-6 weeks.   TED hose    Complete by:  As directed   Use stockings (TED hose) for 2 weeks on both leg(s).  You may remove them at night for sleeping.        Medication List    STOP taking these medications   HYDROcodone-acetaminophen 5-325 MG tablet Commonly known as:  NORCO/VICODIN Replaced by:  HYDROcodone-acetaminophen 7.5-325 MG tablet      TAKE these medications   apixaban 5 MG Tabs tablet Commonly known as:  ELIQUIS Take 1 tablet (5 mg total) by mouth 2 (two) times daily.   Biotin 5000 MCG Caps Take 5,000 mcg by mouth daily.   CALCIUM 600 PO Take 600 mg by mouth 2 (two) times daily.   cholecalciferol 1000 units tablet Commonly known as:  VITAMIN D Take 5,000 Units by mouth daily.   cyanocobalamin 1000 MCG/ML injection Commonly known as:  (VITAMIN B-12) Inject 1 mL (1,000 mcg total) into the muscle every 30 (thirty) days.   docusate sodium 100 MG capsule Commonly known as:  COLACE Take 1 capsule (100 mg total) by mouth 2 (two) times daily.   ferrous sulfate 325 (65 FE) MG tablet Take 1 tablet (325 mg total) by mouth 3 (three) times daily after meals.   Fish Oil 1200 MG Caps Take 1,000 mg by mouth 2 (two) times daily.   flecainide 50 MG tablet Commonly known as:  TAMBOCOR Take 1 tablet (50 mg total) by mouth 2 (two) times daily.   gabapentin 300 MG capsule Commonly known as:  NEURONTIN TAKE 1 CAPSULE BY MOUTH AT LUNCH, 1 AT DINNER, AND 2 AT BEDTIME What changed:  how much to take  how to take this  when to take this  additional instructions   HYDROcodone-acetaminophen 7.5-325 MG tablet Commonly known as:  NORCO Take 1-2 tablets by mouth every 4 (four) hours as needed for moderate pain. Replaces:  HYDROcodone-acetaminophen 5-325 MG tablet   metoprolol tartrate 25 MG tablet Commonly known as:  LOPRESSOR Take 0.5 tablets (12.5 mg total) by mouth 2 (two) times daily.   nitroGLYCERIN 0.4 MG SL tablet Commonly known as:  NITROSTAT Place 1 tablet (0.4 mg total) under the tongue every 5 (five) minutes as needed for chest pain.   polyethylene glycol packet Commonly known as:  MIRALAX / GLYCOLAX Take 17 g by mouth 2 (two) times daily.   pravastatin 20 MG tablet Commonly known as:  PRAVACHOL TAKE 1 TABLET BY MOUTH MON,WED,FRI   tiZANidine 4 MG tablet Commonly known as:  ZANAFLEX Take 1  tablet (4 mg total) by mouth every 6 (six) hours as needed for muscle spasms.        Signed: [place] Pugh. Aritha Huckeba   PA-C  03/11/2016, 3:50 PM

## 2016-03-11 NOTE — Progress Notes (Signed)
Physical Therapy Treatment Note    03/11/16 1500  PT Visit Information  Last PT Received On 03/11/16  Assistance Needed +1  History of Present Illness Pt is a 75 year old female s/p L TKA with hx of R TKA  Subjective Data  Subjective Pt ambulated short distance to practice steps.  Daughter present and assisted with holding RW.  Pt to d/c home today and did not wish to perform exercises yet so provided and reviewed HEP handout.  Pt to start outpatient PT on Thursday.  Pt and daughter's questions answered.  Daughter feels able to provide care for pt at home.  Precautions  Precautions Fall;Knee  Restrictions  Other Position/Activity Restrictions WBAT  Pain Assessment  Pain Assessment 0-10  Pain Score 8  Pain Location L knee with ambulation  Pain Descriptors / Indicators Aching;Sore  Pain Intervention(s) Monitored during session;Premedicated before session;Repositioned  Cognition  Arousal/Alertness Awake/alert  Behavior During Therapy WFL for tasks assessed/performed  Overall Cognitive Status Within Functional Limits for tasks assessed  Bed Mobility  General bed mobility comments pt sitting EOB on arrival (used [facility]) and daughter assisted with back to bed  Transfers  Overall transfer level Needs assistance  Equipment used Rolling walker (2 wheeled)  Transfers Sit to/from Stand  Sit to Stand Min guard  General transfer comment min/guard for safety due to pain  Ambulation/Gait  Ambulation/Gait assistance Min guard  Ambulation Distance (Feet) 20 Feet  Assistive device Rolling walker (2 wheeled)  Gait Pattern/deviations Step-to pattern;Antalgic;Decreased stance time - left  General Gait Details verbal cues for sequence, RW positioning, posture, distance limited by fatigue and pain  Stairs Yes  Stairs assistance Min guard  Stair Management Step to pattern;Backwards;With walker  Number of Stairs 2  General stair comments verbal cues for sequence, safety, RW positioning, daughter  present and assisted with RW, provided HEP handout, pt will have family assist at home  Exercises  Exercises (not performed however reviewed and explained HEP handout)  PT - End of Session  Activity Tolerance Patient limited by pain  Patient left in bed;with call bell/phone within reach;with family/visitor present  PT - Assessment/Plan  PT Plan Current plan remains appropriate  PT Frequency (ACUTE ONLY) 7X/week  Follow Up Recommendations Supervision for mobility/OOB (pt plans to start with ouptatient on Thurs)  PT equipment None recommended by PT  PT Goal Progression  Progress towards PT goals Progressing toward goals  PT Time Calculation  PT Start Time (ACUTE ONLY) 1436  PT Stop Time (ACUTE ONLY) 1448  PT Time Calculation (min) (ACUTE ONLY) 12 min  PT General Charges  $$ ACUTE PT VISIT 1 Procedure  PT Treatments  $Gait Training 8-22 mins   Carmelia Bake, PT, DPT 03/11/2016 Pager: [phone]

## 2016-03-13 ENCOUNTER — Ambulatory Visit: Payer: Medicare Other

## 2016-03-13 DIAGNOSIS — M479 Spondylosis, unspecified: Secondary | ICD-10-CM | POA: Diagnosis not present

## 2016-03-13 DIAGNOSIS — I10 Essential (primary) hypertension: Secondary | ICD-10-CM | POA: Diagnosis not present

## 2016-03-13 DIAGNOSIS — Z471 Aftercare following joint replacement surgery: Secondary | ICD-10-CM | POA: Diagnosis not present

## 2016-03-13 DIAGNOSIS — D510 Vitamin B12 deficiency anemia due to intrinsic factor deficiency: Secondary | ICD-10-CM | POA: Diagnosis not present

## 2016-03-13 DIAGNOSIS — G629 Polyneuropathy, unspecified: Secondary | ICD-10-CM | POA: Diagnosis not present

## 2016-03-13 DIAGNOSIS — I480 Paroxysmal atrial fibrillation: Secondary | ICD-10-CM | POA: Diagnosis not present

## 2016-03-14 DIAGNOSIS — I10 Essential (primary) hypertension: Secondary | ICD-10-CM | POA: Diagnosis not present

## 2016-03-14 DIAGNOSIS — I480 Paroxysmal atrial fibrillation: Secondary | ICD-10-CM | POA: Diagnosis not present

## 2016-03-14 DIAGNOSIS — Z471 Aftercare following joint replacement surgery: Secondary | ICD-10-CM | POA: Diagnosis not present

## 2016-03-14 DIAGNOSIS — D510 Vitamin B12 deficiency anemia due to intrinsic factor deficiency: Secondary | ICD-10-CM | POA: Diagnosis not present

## 2016-03-14 DIAGNOSIS — G629 Polyneuropathy, unspecified: Secondary | ICD-10-CM | POA: Diagnosis not present

## 2016-03-14 DIAGNOSIS — M479 Spondylosis, unspecified: Secondary | ICD-10-CM | POA: Diagnosis not present

## 2016-03-17 DIAGNOSIS — Z471 Aftercare following joint replacement surgery: Secondary | ICD-10-CM | POA: Diagnosis not present

## 2016-03-17 DIAGNOSIS — I10 Essential (primary) hypertension: Secondary | ICD-10-CM | POA: Diagnosis not present

## 2016-03-17 DIAGNOSIS — M479 Spondylosis, unspecified: Secondary | ICD-10-CM | POA: Diagnosis not present

## 2016-03-17 DIAGNOSIS — G629 Polyneuropathy, unspecified: Secondary | ICD-10-CM | POA: Diagnosis not present

## 2016-03-17 DIAGNOSIS — I480 Paroxysmal atrial fibrillation: Secondary | ICD-10-CM | POA: Diagnosis not present

## 2016-03-17 DIAGNOSIS — D510 Vitamin B12 deficiency anemia due to intrinsic factor deficiency: Secondary | ICD-10-CM | POA: Diagnosis not present

## 2016-03-18 ENCOUNTER — Ambulatory Visit: Payer: Medicare Other | Attending: Orthopedic Surgery | Admitting: Physical Therapy

## 2016-03-18 ENCOUNTER — Encounter: Payer: Medicare Other | Admitting: Physical Therapy

## 2016-03-18 DIAGNOSIS — R600 Localized edema: Secondary | ICD-10-CM | POA: Insufficient documentation

## 2016-03-18 DIAGNOSIS — M25662 Stiffness of left knee, not elsewhere classified: Secondary | ICD-10-CM | POA: Diagnosis not present

## 2016-03-18 DIAGNOSIS — M25562 Pain in left knee: Secondary | ICD-10-CM

## 2016-03-18 NOTE — Therapy (Signed)
[facility] [place], [place], [address] Phone: [phone]   Fax:  [phone]  Physical Therapy Evaluation  Patient Details  Name: Cassie White MRN: [id number] Date of Birth: May 13, 1941 Referring Provider: Paralee Cancel MD  Encounter Date: 03/18/2016      PT End of Session - 03/18/16 1847    Visit Number 1   Number of Visits 12   Date for PT Re-Evaluation 05/17/16   PT Start Time 0145   PT Stop Time 0244   PT Time Calculation (min) 59 min   Activity Tolerance Patient limited by pain   Behavior During Therapy [facility] for tasks assessed/performed      Past Medical History:  Diagnosis Date  . Arthritis    DJD, back and both hips  . Chronic low back pain without sciatica    DDD + scoliosis.  As of 12/04/15, Dr. Nelva Bush will do a back injection and follow her up in 1 mo.  . Diverticulosis    on colonoscopy 2004  . GERD (gastroesophageal reflux disease)   . Hx of cardiovascular stress test    Lexiscan Myoview (2/14):  Low risk, small ant defect likely breast attenuation, no ischemia, EF 69% (no change from 2010).    . Hyperlipidemia   . Hypertension    per pt  . Mitral regurgitation    Echo (2/14):  EF 60-65%, Gr 1 DD, mild AI, mild bileaflet MVP, mod post directed MR, mild LAE, PASP 35.  Marland Kitchen MVP (mitral valve prolapse)   . Osteoporosis, senile    stable at the left hip 2014-2015.  Took fosamax for 10 yrs.  Plan: get repeat DEXA q35mo.  Marland Kitchen PAF (paroxysmal atrial fibrillation) (HCC)    Dr. Martinique: Flecainide, metopr, apxiaban.  . Peripheral neuropathy ([place]) 2008   Idiopathic vs familial (motor>sensory): gabapentin helpful  . PONV (postoperative nausea and vomiting)   . Scoliosis   . Shoulder pain left   RC surg 09/2014    Past Surgical History:  Procedure Laterality Date  . BREAST SURGERY  Nov 2010   Benign biopsy, right  . CHOLECYSTECTOMY  03/2010   Dr.Gross  . COLONOSCOPY  10/03/2002   No polyps.  Repeat 10 yrs  (Dr. Olevia Perches)  . COMBINED HYSTERECTOMY VAGINAL / OOPHORECTOMY / A&P REPAIR  0000000   uncertain if ovaries were removed or not  . CYSTOCELE REPAIR    . Lumpectomy  06/2009   "Fatty Necrosis"  . PFT's  2014   NORMAL  . ROTATOR CUFF REPAIR Left 10/02/14   Dr. Veverly Fells  . TONSILLECTOMY    . TOTAL KNEE ARTHROPLASTY  09/2009   Right ;Dr Alvan Dame  . TOTAL KNEE ARTHROPLASTY Left 03/10/2016   Procedure: TOTAL KNEE ARTHROPLASTY;  Surgeon: Paralee Cancel, MD;  Location: [facility];  Service: Orthopedics;  Laterality: Left;  . TRANSTHORACIC ECHOCARDIOGRAM  09/2012   EF 60-65%, Gr 1 DD, mild AI, mild bileaflet MVP, mod post directed MR, mild LAE, PASP 35.  Marland Kitchen VAGINAL HYSTERECTOMY     For Uterine Deviation     There were no vitals filed for this visit.       Subjective Assessment - 03/18/16 1850    Subjective I had a short hospital stay and three home health visits but I am doing the exercises they showed me.     Limitations Walking   How long can you walk comfortably? Short distances.   Patient Stated Goals Get out of pain and walk better.   Pain  Score 4    Pain Location Knee   Pain Orientation Left   Pain Descriptors / Indicators Sore   Pain Type Surgical pain            [facility] PT Assessment - 03/18/16 0001      Assessment   Medical Diagnosis Left total knee replacement.   Referring Provider Paralee Cancel MD   Onset Date/Surgical Date --  03/10/16 (surgery date).     Precautions   Precautions --  No ultrasound.     Restrictions   Weight Bearing Restrictions No     Balance Screen   Has the patient fallen in the past 6 months No   Has the patient had a decrease in activity level because of a fear of falling?  No   Is the patient reluctant to leave their home because of a fear of falling?  No     Prior Function   Level of Independence Independent     Observation/Other Assessments-Edema    Edema Circumferential     Circumferential Edema   Circumferential - Right LT 5cms > LT mid-patellar  region.     ROM / Strength   AROM / PROM / Strength AROM;Strength     AROM   Overall AROM Comments -15 degrees to -10 degrees passive with active flexion to 89 degrees.     Strength   Overall Strength Comments Decreased volitional contraction of left quadriceps with left hip flexion and abduction= 3 to 3+/5.     Palpation   Palpation comment Diffuse left knee palpable tenderness.     Ambulation/Gait   Gait Comments Safe ambulation with a FWW.                   [place] Adult PT Treatment/Exercise - 03/18/16 0001      Exercises   Exercises Knee/Hip     Knee/Hip Exercises: Aerobic   Nustep ward x 1 to increase left knee flexion.     Modalities   Modalities Location manager Stimulation Location left knee.   Electrical Stimulation Action 1-10 Hz x 15 minutes.   Electrical Stimulation Parameters IFC   Electrical Stimulation Goals Edema;Pain     Vasopneumatic   Number Minutes Vasopneumatic  15 minutes   Vasopnuematic Location  --  Left knee.   Vasopneumatic Pressure Medium                  PT Short Term Goals - 03/18/16 1858      PT SHORT TERM GOAL #1   Title Ind with a HEP.   Time 2   Period Weeks   Status New     PT SHORT TERM GOAL #2   Title Full left knee extension.   Time 2   Period Weeks   Status New           PT Long Term Goals - 03/18/16 1859      PT LONG TERM GOAL #1   Title Independent with an advanced HEP.   Time 8   Period Weeks   Status New     PT LONG TERM GOAL #2   Title Active left  knee flexion to 115 degrees+ so the patient can perform functional tasks and do so with pain not > 2-3/10.   Time 8   Period Weeks   Status New     PT LONG TERM GOAL #3   Title Increase left knee strength to  a solid 4+/5 to provide good stability for accomplishment of functional activities   Time 8   Period Weeks   Status New     PT LONG TERM GOAL #4   Title Decrease  edema to within 2 cms of non-affected side to assist with pain reduction and range of motion gains.   Time 8   Period Weeks   Status New     PT LONG TERM GOAL #5   Title Perform a reciprocating stair gait with one railing with pain not > 2-3/10.   Time 8   Period Weeks   Status New               Plan - 03/18/16 1853    Clinical Impression Statement The patient underwent a left total knee replacement on 03/10/16.  She is pleased with her progress thus far.  She had three [place] visits and is compliant to her HEP.  She currently demonstrates a loss of both flexion and extension and has significant edema and strength loss currently.  She is ambulating safely with a FWW.   Rehab Potential Good   PT Frequency 3x / week   PT Duration 4 weeks   PT Treatment/Interventions Electrical Stimulation;Cryotherapy;Therapeutic activities;Therapeutic exercise;Patient/family education;Passive range of motion;Vasopneumatic Device;Manual techniques   PT Next Visit Plan TKA protocol.  Electrical stimulation and vasopneumatic.   Consulted and Agree with Plan of Care Patient      Patient will benefit from skilled therapeutic intervention in order to improve the following deficits and impairments:  Pain, Increased edema, Decreased strength, Decreased activity tolerance, Decreased range of motion  Visit Diagnosis: Pain in left knee - Plan: PT plan of care cert/re-cert  Stiffness of left knee, not elsewhere classified - Plan: PT plan of care cert/re-cert  Localized edema - Plan: PT plan of care cert/re-cert      G-Codes - [id number]    Functional Assessment Tool Used Clinical judgement.   Functional Limitation Mobility: Walking and moving around   Mobility: Walking and Moving Around Current Status [phone]) At least 40 percent but less than 60 percent impaired, limited or restricted   Mobility: Walking and Moving Around Goal Status [phone]) At least 1 percent but less than 20 percent impaired, limited  or restricted       Problem List Patient Active Problem List   Diagnosis Date Noted  . 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 Paroxysmal atrial fibrillation with rapid ventricular response ([place]) 09/28/2015  . Maxillary sinusitis, acute 08/27/2015  . Acute bronchitis 09/08/2014  . Mitral insufficiency 05/12/2014  . HTN (hypertension) 04/28/2014  . Lactic acidosis 09/07/2013  . Mitral valve prolapse 06/14/2013  . Chest pain, exertional 09/13/2012  . Pernicious anemia 09/07/2012  . DIVERTICULOSIS, COLON 09/06/2009  . Osteoporosis 09/06/2009  . Hyperlipidemia 09/04/2009  . PERIPHERAL NEUROPATHY 09/04/2009  . LOW BACK PAIN, CHRONIC 04/08/2007    Ilena Dieckman, [place]  MPT 03/18/2016, 7:07 PM  [facility] [address] [place], [place], [address] Phone: [phone]   Fax:  [phone]  Name: MARIMAR KEASLER MRN: [id number] Date of Birth: [birth year]

## 2016-03-20 ENCOUNTER — Encounter: Payer: Self-pay | Admitting: Physical Therapy

## 2016-03-20 ENCOUNTER — Ambulatory Visit: Payer: Medicare Other | Admitting: Physical Therapy

## 2016-03-20 DIAGNOSIS — M25662 Stiffness of left knee, not elsewhere classified: Secondary | ICD-10-CM

## 2016-03-20 DIAGNOSIS — R600 Localized edema: Secondary | ICD-10-CM | POA: Diagnosis not present

## 2016-03-20 DIAGNOSIS — M25562 Pain in left knee: Secondary | ICD-10-CM | POA: Diagnosis not present

## 2016-03-20 NOTE — Therapy (Signed)
[facility] [place], [place], [address] Phone: [phone]   Fax:  [phone]  Physical Therapy Treatment  Patient Details  Name: Cassie White MRN: [id number] Date of Birth: Jul 30, 1941 Referring Provider: Paralee Cancel MD  Encounter Date: 03/20/2016      PT End of Session - 03/20/16 1039    Visit Number 2   Number of Visits 12   Date for PT Re-Evaluation 05/17/16   PT Start Time 1033   PT Stop Time 1121   PT Time Calculation (min) 48 min   Activity Tolerance Patient limited by pain   Behavior During Therapy [facility] for tasks assessed/performed      Past Medical History:  Diagnosis Date  . Arthritis    DJD, back and both hips  . Chronic low back pain without sciatica    DDD + scoliosis.  As of 12/04/15, Dr. Nelva Bush will do a back injection and follow her up in 1 mo.  . Diverticulosis    on colonoscopy 2004  . GERD (gastroesophageal reflux disease)   . Hx of cardiovascular stress test    Lexiscan Myoview (2/14):  Low risk, small ant defect likely breast attenuation, no ischemia, EF 69% (no change from 2010).    . Hyperlipidemia   . Hypertension    per pt  . Mitral regurgitation    Echo (2/14):  EF 60-65%, Gr 1 DD, mild AI, mild bileaflet MVP, mod post directed MR, mild LAE, PASP 35.  Marland Kitchen MVP (mitral valve prolapse)   . Osteoporosis, senile    stable at the left hip 2014-2015.  Took fosamax for 10 yrs.  Plan: get repeat DEXA q29mo.  Marland Kitchen PAF (paroxysmal atrial fibrillation) (HCC)    Dr. Martinique: Flecainide, metopr, apxiaban.  . Peripheral neuropathy ([place]) 2008   Idiopathic vs familial (motor>sensory): gabapentin helpful  . PONV (postoperative nausea and vomiting)   . Scoliosis   . Shoulder pain left   RC surg 09/2014    Past Surgical History:  Procedure Laterality Date  . BREAST SURGERY  Nov 2010   Benign biopsy, right  . CHOLECYSTECTOMY  03/2010   Dr.Gross  . COLONOSCOPY  10/03/2002   No polyps.  Repeat 10 yrs (Dr.  Olevia Perches)  . COMBINED HYSTERECTOMY VAGINAL / OOPHORECTOMY / A&P REPAIR  0000000   uncertain if ovaries were removed or not  . CYSTOCELE REPAIR    . Lumpectomy  06/2009   "Fatty Necrosis"  . PFT's  2014   NORMAL  . ROTATOR CUFF REPAIR Left 10/02/14   Dr. Veverly Fells  . TONSILLECTOMY    . TOTAL KNEE ARTHROPLASTY  09/2009   Right ;Dr Alvan Dame  . TOTAL KNEE ARTHROPLASTY Left 03/10/2016   Procedure: TOTAL KNEE ARTHROPLASTY;  Surgeon: Paralee Cancel, MD;  Location: [facility];  Service: Orthopedics;  Laterality: Left;  . TRANSTHORACIC ECHOCARDIOGRAM  09/2012   EF 60-65%, Gr 1 DD, mild AI, mild bileaflet MVP, mod post directed MR, mild LAE, PASP 35.  Marland Kitchen VAGINAL HYSTERECTOMY     For Uterine Deviation     There were no vitals filed for this visit.      Subjective Assessment - 03/20/16 1038    Subjective Reports that she took 2 pain pills prior to PT treatment today.   Limitations Walking   How long can you walk comfortably? Short distances.   Patient Stated Goals Get out of pain and walk better.   Currently in Pain? Yes   Pain Score 4  Pain Location Knee   Pain Orientation Left   Pain Descriptors / Indicators Sore   Pain Type Surgical pain            [facility] PT Assessment - 03/20/16 0001      Assessment   Medical Diagnosis Left total knee replacement.   Onset Date/Surgical Date 03/10/16     Restrictions   Weight Bearing Restrictions No                     [facility] Adult PT Treatment/Exercise - 03/20/16 0001      Knee/Hip Exercises: Aerobic   Nustep L3, seat 11 x15 min     Knee/Hip Exercises: Standing   Rocker Board 3 minutes     Modalities   Modalities Passenger transport manager Location L knee   Electrical Stimulation Action IFC   Electrical Stimulation Parameters 1-10 hz x15 min   Electrical Stimulation Goals Edema;Pain     Vasopneumatic   Number Minutes Vasopneumatic  13 minutes   Vasopnuematic Location   Knee   Vasopneumatic Pressure Medium   Vasopneumatic Temperature  34     Manual Therapy   Manual Therapy Passive ROM   Passive ROM PROM of L knee into flex/ext with gentle holds at end range with oscillations to promote relaxation                  PT Short Term Goals - 03/18/16 1858      PT SHORT TERM GOAL #1   Title Ind with a HEP.   Time 2   Period Weeks   Status New     PT SHORT TERM GOAL #2   Title Full left knee extension.   Time 2   Period Weeks   Status New           PT Long Term Goals - 03/18/16 1859      PT LONG TERM GOAL #1   Title Independent with an advanced HEP.   Time 8   Period Weeks   Status New     PT LONG TERM GOAL #2   Title Active left  knee flexion to 115 degrees+ so the patient can perform functional tasks and do so with pain not > 2-3/10.   Time 8   Period Weeks   Status New     PT LONG TERM GOAL #3   Title Increase left knee strength to a solid 4+/5 to provide good stability for accomplishment of functional activities   Time 8   Period Weeks   Status New     PT LONG TERM GOAL #4   Title Decrease edema to within 2 cms of non-affected side to assist with pain reduction and range of motion gains.   Time 8   Period Weeks   Status New     PT LONG TERM GOAL #5   Title Perform a reciprocating stair gait with one railing with pain not > 2-3/10.   Time 8   Period Weeks   Status New               Plan - 03/20/16 1110    Clinical Impression Statement Patient arrived to treatment with mid level L knee soreness and discomfort. Patient continues to ambulate with FWW in clinic. Patient ambulates slowly and carefully with FWW secondary to pain. Post-surgical bandage still in place over L knee incision. Patient tolerated therapeutic exercise fairly well although patient  is hesitant with exercise secondary to L knee pain. Gentle PROM of L knee completed in supine to improve L knee ROM with oscillations utilized to promote  relaxation. Firm end feels noted with PROM of L knee into flex/ext. Normal modalities response noted following removal of the modalities. Goals remain on-going at this time secondary to this being patient's second visit for TKR. Patient reported sore spot in medial L knee with modalities today. Patient experienced L knee feeling "stimulated" and "sore" following today's treatment per patient report.   Rehab Potential Good   PT Frequency 3x / week   PT Duration 4 weeks   PT Treatment/Interventions Electrical Stimulation;Cryotherapy;Therapeutic activities;Therapeutic exercise;Patient/family education;Passive range of motion;Vasopneumatic Device;Manual techniques   PT Next Visit Plan Continue per L TKR protocol with modalities and PROM to control pain and increase ROM per MPT POC.   Consulted and Agree with Plan of Care Patient      Patient will benefit from skilled therapeutic intervention in order to improve the following deficits and impairments:  Pain, Increased edema, Decreased strength, Decreased activity tolerance, Decreased range of motion  Visit Diagnosis: Pain in left knee  Stiffness of left knee, not elsewhere classified  Localized edema     Problem List Patient Active Problem List   Diagnosis Date Noted  . 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 Paroxysmal atrial fibrillation with rapid ventricular response ([place]) 09/28/2015  . Maxillary sinusitis, acute 08/27/2015  . Acute bronchitis 09/08/2014  . Mitral insufficiency 05/12/2014  . HTN (hypertension) 04/28/2014  . Lactic acidosis 09/07/2013  . Mitral valve prolapse 06/14/2013  . Chest pain, exertional 09/13/2012  . Pernicious anemia 09/07/2012  . DIVERTICULOSIS, COLON 09/06/2009  . Osteoporosis 09/06/2009  . Hyperlipidemia 09/04/2009  . PERIPHERAL NEUROPATHY 09/04/2009  . LOW BACK PAIN, CHRONIC 04/08/2007    Wynelle Fanny, PTA 03/20/2016, 11:26 AM  [facility] [address] [place], [place], [address] Phone: [phone]   Fax:  [phone]  Name: Cassie White MRN: [id number] Date of Birth: 03-08-1941

## 2016-03-21 ENCOUNTER — Ambulatory Visit: Payer: Medicare Other | Admitting: *Deleted

## 2016-03-21 DIAGNOSIS — M25662 Stiffness of left knee, not elsewhere classified: Secondary | ICD-10-CM

## 2016-03-21 DIAGNOSIS — M25562 Pain in left knee: Secondary | ICD-10-CM

## 2016-03-21 DIAGNOSIS — R600 Localized edema: Secondary | ICD-10-CM | POA: Diagnosis not present

## 2016-03-21 NOTE — Therapy (Signed)
[facility] [place], [place], [address] Phone: [phone]   Fax:  [phone]  Physical Therapy Treatment  Patient Details  Name: Cassie White MRN: [id number] Date of Birth: 07-24-1941 Referring Provider: Paralee Cancel MD  Encounter Date: 03/21/2016      PT End of Session - 03/21/16 1135    Visit Number 3   Number of Visits 12   Date for PT Re-Evaluation 05/17/16   PT Start Time 1115   PT Stop Time 1215   PT Time Calculation (min) 60 min   Equipment Utilized During Treatment Gait belt   Activity Tolerance Patient limited by pain   Behavior During Therapy [facility] for tasks assessed/performed      Past Medical History:  Diagnosis Date  . Arthritis    DJD, back and both hips  . Chronic low back pain without sciatica    DDD + scoliosis.  As of 12/04/15, Dr. Nelva Bush will do a back injection and follow her up in 1 mo.  . Diverticulosis    on colonoscopy 2004  . GERD (gastroesophageal reflux disease)   . Hx of cardiovascular stress test    Lexiscan Myoview (2/14):  Low risk, small ant defect likely breast attenuation, no ischemia, EF 69% (no change from 2010).    . Hyperlipidemia   . Hypertension    per pt  . Mitral regurgitation    Echo (2/14):  EF 60-65%, Gr 1 DD, mild AI, mild bileaflet MVP, mod post directed MR, mild LAE, PASP 35.  Marland Kitchen MVP (mitral valve prolapse)   . Osteoporosis, senile    stable at the left hip 2014-2015.  Took fosamax for 10 yrs.  Plan: get repeat DEXA q39mo.  Marland Kitchen PAF (paroxysmal atrial fibrillation) (HCC)    Dr. Martinique: Flecainide, metopr, apxiaban.  . Peripheral neuropathy ([place]) 2008   Idiopathic vs familial (motor>sensory): gabapentin helpful  . PONV (postoperative nausea and vomiting)   . Scoliosis   . Shoulder pain left   RC surg 09/2014    Past Surgical History:  Procedure Laterality Date  . BREAST SURGERY  Nov 2010   Benign biopsy, right  . CHOLECYSTECTOMY  03/2010   Dr.Gross  .  COLONOSCOPY  10/03/2002   No polyps.  Repeat 10 yrs (Dr. Olevia Perches)  . COMBINED HYSTERECTOMY VAGINAL / OOPHORECTOMY / A&P REPAIR  0000000   uncertain if ovaries were removed or not  . CYSTOCELE REPAIR    . Lumpectomy  06/2009   "Fatty Necrosis"  . PFT's  2014   NORMAL  . ROTATOR CUFF REPAIR Left 10/02/14   Dr. Veverly Fells  . TONSILLECTOMY    . TOTAL KNEE ARTHROPLASTY  09/2009   Right ;Dr Alvan Dame  . TOTAL KNEE ARTHROPLASTY Left 03/10/2016   Procedure: TOTAL KNEE ARTHROPLASTY;  Surgeon: Paralee Cancel, MD;  Location: [facility];  Service: Orthopedics;  Laterality: Left;  . TRANSTHORACIC ECHOCARDIOGRAM  09/2012   EF 60-65%, Gr 1 DD, mild AI, mild bileaflet MVP, mod post directed MR, mild LAE, PASP 35.  Marland Kitchen VAGINAL HYSTERECTOMY     For Uterine Deviation     There were no vitals filed for this visit.      Subjective Assessment - 03/21/16 1133    Subjective Doing ok LT knee   Limitations Walking   How long can you walk comfortably? Short distances.   Patient Stated Goals Get out of pain and walk better.   Currently in Pain? Yes   Pain Score 4  Pain Location Knee   Pain Orientation Left   Pain Descriptors / Indicators Sore   Pain Type Surgical pain   Pain Frequency Intermittent                         [facility] Adult PT Treatment/Exercise - 03/21/16 0001      Knee/Hip Exercises: Aerobic   Nustep L3, seat 10-8 progression for ROM x15 min     Knee/Hip Exercises: Standing   Rocker Board 5 minutes  calf stretching and balance     Modalities   Modalities Electrical Stimulation;Vasopneumatic     Electrical Stimulation   Electrical Stimulation Location L knee IFC x 15 mins 1-10hz    Electrical Stimulation Goals Edema;Pain     Vasopneumatic   Number Minutes Vasopneumatic  15 minutes   Vasopnuematic Location  Knee   Vasopneumatic Pressure Medium   Vasopneumatic Temperature  34     Manual Therapy   Manual Therapy Passive ROM   Passive ROM PROM of L knee into flex/ext with gentle  holds at end range with oscillations to promote relaxation with focus on Extension      Quad sets 2x10 LT LE             PT Short Term Goals - 03/18/16 1858      PT SHORT TERM GOAL #1   Title Ind with a HEP.   Time 2   Period Weeks   Status New     PT SHORT TERM GOAL #2   Title Full left knee extension.   Time 2   Period Weeks   Status New           PT Long Term Goals - 03/18/16 1859      PT LONG TERM GOAL #1   Title Independent with an advanced HEP.   Time 8   Period Weeks   Status New     PT LONG TERM GOAL #2   Title Active left  knee flexion to 115 degrees+ so the patient can perform functional tasks and do so with pain not > 2-3/10.   Time 8   Period Weeks   Status New     PT LONG TERM GOAL #3   Title Increase left knee strength to a solid 4+/5 to provide good stability for accomplishment of functional activities   Time 8   Period Weeks   Status New     PT LONG TERM GOAL #4   Title Decrease edema to within 2 cms of non-affected side to assist with pain reduction and range of motion gains.   Time 8   Period Weeks   Status New     PT LONG TERM GOAL #5   Title Perform a reciprocating stair gait with one railing with pain not > 2-3/10.   Time 8   Period Weeks   Status New               Plan - 03/21/16 1211    Clinical Impression Statement Pt did fairly well with Rx. Her LT knee pain is about the same as yesterday. Pt ambulates with a FWW with antalgic gait pattern  and flexed posture. Post-surgical bandage still intact over incision. She was able to perform Therex for LT TKR  with minimal increase in discomfort. Goals are ongoing at this time   Rehab Potential Good   PT Frequency 3x / week   PT Duration 4 weeks   PT Treatment/Interventions Electrical  Stimulation;Cryotherapy;Therapeutic activities;Therapeutic exercise;Patient/family education;Passive range of motion;Vasopneumatic Device;Manual techniques   PT Next Visit Plan Continue per  L TKR protocol with modalities and PROM to control pain and increase ROM per MPT POC.   Consulted and Agree with Plan of Care Patient      Patient will benefit from skilled therapeutic intervention in order to improve the following deficits and impairments:  Pain, Increased edema, Decreased strength, Decreased activity tolerance, Decreased range of motion  Visit Diagnosis: Pain in left knee  Stiffness of left knee, not elsewhere classified     Problem List Patient Active Problem List   Diagnosis Date Noted  . 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 Paroxysmal atrial fibrillation with rapid ventricular response ([place]) 09/28/2015  . Maxillary sinusitis, acute 08/27/2015  . Acute bronchitis 09/08/2014  . Mitral insufficiency 05/12/2014  . HTN (hypertension) 04/28/2014  . Lactic acidosis 09/07/2013  . Mitral valve prolapse 06/14/2013  . Chest pain, exertional 09/13/2012  . Pernicious anemia 09/07/2012  . DIVERTICULOSIS, COLON 09/06/2009  . Osteoporosis 09/06/2009  . Hyperlipidemia 09/04/2009  . PERIPHERAL NEUROPATHY 09/04/2009  . LOW BACK PAIN, CHRONIC 04/08/2007    Kholton Coate,CHRIS, PTA 03/21/2016, 12:33 PM  [facility] [place], [place], [address] Phone: [phone]   Fax:  [phone]  Name: Cassie White MRN: [id number] Date of Birth: 03-26-1941

## 2016-03-24 ENCOUNTER — Ambulatory Visit: Payer: Medicare Other | Admitting: Physical Therapy

## 2016-03-24 ENCOUNTER — Encounter: Payer: Self-pay | Admitting: Physical Therapy

## 2016-03-24 DIAGNOSIS — M25562 Pain in left knee: Secondary | ICD-10-CM | POA: Diagnosis not present

## 2016-03-24 DIAGNOSIS — R600 Localized edema: Secondary | ICD-10-CM

## 2016-03-24 DIAGNOSIS — M25662 Stiffness of left knee, not elsewhere classified: Secondary | ICD-10-CM

## 2016-03-24 NOTE — Patient Instructions (Signed)
Knee Extension Mobilization: Towel Prop   With rolled towel under right ankle, place _1-5___ pound weight across knee. Hold __5+__ minutes. Repeat __2-3__ times per set. Do __2__ sets per session. Do __2-4__ sessions per day.   KNEE: Knee Hang - Prone   Lie on stomach. Place towel above knee; hang feet off surface. Keep feet straight. Hold _60__ seconds. _5__ reps per set, __2-4_ sets per day Add _0+__ lb weights to ankles.  Sit and Reach - Foot Supported   Place one foot on table. Straighten leg and attempt to keep it straight, then push down until feel a stretch. Hold _30__ seconds. Repeat __5-10_ times each leg, alternating. Do _2-4__ sessions per day.        Knee Flexion Stretch on Step  Place foot on step and lean forward until you feel a good stretch in front of knee.   hold 30 sec x 5-10 Perform 2-4 times daily

## 2016-03-24 NOTE — Therapy (Signed)
[facility] [place], [place], [address] Phone: [phone]   Fax:  [phone]  Physical Therapy Treatment  Patient Details  Name: Cassie White MRN: [id number] Date of Birth: 12/30/1940 Referring Provider: Paralee Cancel MD  Encounter Date: 03/24/2016      PT End of Session - 03/24/16 1237    Visit Number 4   Number of Visits 12   Date for PT Re-Evaluation 05/17/16   PT Start Time 1200   PT Stop Time 1497   PT Time Calculation (min) 45 min   Activity Tolerance Patient limited by pain;Patient limited by lethargy   Behavior During Therapy [facility] for tasks assessed/performed      Past Medical History:  Diagnosis Date  . Arthritis    DJD, back and both hips  . Chronic low back pain without sciatica    DDD + scoliosis.  As of 12/04/15, Dr. Nelva Bush will do a back injection and follow her up in 1 mo.  . Diverticulosis    on colonoscopy 2004  . GERD (gastroesophageal reflux disease)   . Hx of cardiovascular stress test    Lexiscan Myoview (2/14):  Low risk, small ant defect likely breast attenuation, no ischemia, EF 69% (no change from 2010).    . Hyperlipidemia   . Hypertension    per pt  . Mitral regurgitation    Echo (2/14):  EF 60-65%, Gr 1 DD, mild AI, mild bileaflet MVP, mod post directed MR, mild LAE, PASP 35.  Marland Kitchen MVP (mitral valve prolapse)   . Osteoporosis, senile    stable at the left hip 2014-2015.  Took fosamax for 10 yrs.  Plan: get repeat DEXA q66mo  .Marland KitchenPAF (paroxysmal atrial fibrillation) (HCC)    Dr. JMartinique Flecainide, metopr, apxiaban.  . Peripheral neuropathy ([place] 2008   Idiopathic vs familial (motor>sensory): gabapentin helpful  . PONV (postoperative nausea and vomiting)   . Scoliosis   . Shoulder pain left   RC surg 09/2014    Past Surgical History:  Procedure Laterality Date  . BREAST SURGERY  Nov 2010   Benign biopsy, right  . CHOLECYSTECTOMY  03/2010   Dr.Gross  . COLONOSCOPY  10/03/2002   No  polyps.  Repeat 10 yrs (Dr. BOlevia Perches  . COMBINED HYSTERECTOMY VAGINAL / OOPHORECTOMY / A&P REPAIR  10263  uncertain if ovaries were removed or not  . CYSTOCELE REPAIR    . Lumpectomy  06/2009   "Fatty Necrosis"  . PFT's  2014   NORMAL  . ROTATOR CUFF REPAIR Left 10/02/14   Dr. NVeverly Fells . TONSILLECTOMY    . TOTAL KNEE ARTHROPLASTY  09/2009   Right ;Dr OAlvan Dame . TOTAL KNEE ARTHROPLASTY Left 03/10/2016   Procedure: TOTAL KNEE ARTHROPLASTY;  Surgeon: MParalee Cancel MD;  Location: [facility];  Service: Orthopedics;  Laterality: Left;  . TRANSTHORACIC ECHOCARDIOGRAM  09/2012   EF 60-65%, Gr 1 DD, mild AI, mild bileaflet MVP, mod post directed MR, mild LAE, PASP 35.  .Marland KitchenVAGINAL HYSTERECTOMY     For Uterine Deviation     There were no vitals filed for this visit.      Subjective Assessment - 03/24/16 1204    Subjective Patient reports feeling some better and moving a little faster   Patient is accompained by: Family member   Limitations Walking   How long can you walk comfortably? Short distances.   Patient Stated Goals Get out of pain and walk better.   Currently in Pain?  Yes   Pain Score 3    Pain Location Knee   Pain Orientation Left   Pain Descriptors / Indicators Sore   Pain Type Surgical pain   Pain Frequency Intermittent   Aggravating Factors  ROM and prolong activity   Pain Relieving Factors rest            [facility] PT Assessment - 03/24/16 0001      ROM / Strength   AROM / PROM / Strength AROM;PROM     AROM   AROM Assessment Site Knee   Right/Left Knee Left   Left Knee Extension -18   Left Knee Flexion 105     PROM   PROM Assessment Site Knee   Right/Left Knee Left   Left Knee Extension -11   Left Knee Flexion 108                     [facility] Adult PT Treatment/Exercise - 03/24/16 0001      Knee/Hip Exercises: Stretches   Knee: Self-Stretch to increase Flexion Left;3 reps;30 seconds     Knee/Hip Exercises: Aerobic   Nustep L3, seat 10-8 progression for  ROM x15 min     Knee/Hip Exercises: Standing   Rocker Board 3 minutes     Electrical Stimulation   Electrical Stimulation Location left knee   Electrical Stimulation Action IFC   Electrical Stimulation Parameters 1-10hz   Electrical Stimulation Goals Edema;Pain     Vasopneumatic   Number Minutes Vasopneumatic  15 minutes   Vasopnuematic Location  Knee   Vasopneumatic Pressure Medium     Manual Therapy   Manual Therapy Passive ROM   Passive ROM PROM of L knee into flex/ext with gentle holds at end range with oscillations to promote relaxation with focus on Extension                PT Education - 03/24/16 1241    Education provided Yes   Education Details HEP   Person(s) Educated Patient;Caregiver(s)   Methods Explanation;Demonstration;Handout   Comprehension Verbalized understanding;Returned demonstration          PT Short Term Goals - 03/24/16 1238      PT SHORT TERM GOAL #1   Title Ind with a HEP.   Time 2   Period Weeks   Status Achieved     PT SHORT TERM GOAL #2   Title Full left knee extension.   Time 2   Period Weeks   Status On-going  AROM -18 degrees 03/24/16           PT Long Term Goals - 03/24/16 1240      PT LONG TERM GOAL #1   Title Independent with an advanced HEP.   Time 8   Period Weeks   Status On-going     PT LONG TERM GOAL #2   Title Active left  knee flexion to 115 degrees+ so the patient can perform functional tasks and do so with pain not > 2-3/10.   Time 8   Period Weeks   Status On-going  AROM 105 degrees 03/24/16     PT LONG TERM GOAL #3   Title Increase left knee strength to a solid 4+/5 to provide good stability for accomplishment of functional activities   Time 8   Period Weeks   Status On-going     PT LONG TERM GOAL #4   Title Decrease edema to within 2 cms of non-affected side to assist with pain reduction and range  of motion gains.   Time 8   Period Weeks   Status On-going     PT LONG TERM GOAL #5    Title Perform a reciprocating stair gait with one railing with pain not > 2-3/10.   Time 8   Period Weeks   Status On-going               Plan - 03/24/16 1242    Clinical Impression Statement Patient progressing with reports of less pain at rest and is able to move a little faster yet continues to use FWW at this time. Patient has not been performing self stretches daily, educated both patient and family member with HEP self stretches daily. Patient limited by pain with left knee flexion stretching today. Patient met STG #1 and other golas ongoing due to pain, ROM and strength deficits.   Rehab Potential Good   PT Frequency 3x / week   PT Duration 4 weeks   PT Treatment/Interventions Electrical Stimulation;Cryotherapy;Therapeutic activities;Therapeutic exercise;Patient/family education;Passive range of motion;Vasopneumatic Device;Manual techniques   PT Next Visit Plan Continue per L TKR protocol with modalities and PROM to control pain and increase ROM per MPT POC. MD Alvan Dame on 03/27/16   Consulted and Agree with Plan of Care Patient      Patient will benefit from skilled therapeutic intervention in order to improve the following deficits and impairments:  Pain, Increased edema, Decreased strength, Decreased activity tolerance, Decreased range of motion  Visit Diagnosis: Pain in left knee  Stiffness of left knee, not elsewhere classified  Localized edema     Problem List Patient Active Problem List   Diagnosis Date Noted  . 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 Paroxysmal atrial fibrillation with rapid ventricular response ([place]) 09/28/2015  . Maxillary sinusitis, acute 08/27/2015  . Acute bronchitis 09/08/2014  . Mitral insufficiency 05/12/2014  . HTN (hypertension) 04/28/2014  . Lactic acidosis 09/07/2013  . Mitral valve prolapse 06/14/2013  . Chest pain, exertional 09/13/2012  . Pernicious anemia 09/07/2012  . DIVERTICULOSIS, COLON 09/06/2009   . Osteoporosis 09/06/2009  . Hyperlipidemia 09/04/2009  . PERIPHERAL NEUROPATHY 09/04/2009  . LOW BACK PAIN, CHRONIC 04/08/2007    Caydee Talkington P, PTA 03/24/2016, 12:47 PM  [facility] [address] [place], [place], [address] Phone: [phone]   Fax:  [phone]  Name: OPHA MCGHEE MRN: [id number] Date of Birth: 22-Jun-1941

## 2016-03-25 ENCOUNTER — Encounter: Payer: Medicare Other | Admitting: Physical Therapy

## 2016-03-26 ENCOUNTER — Ambulatory Visit: Payer: Medicare Other | Admitting: Physical Therapy

## 2016-03-26 ENCOUNTER — Ambulatory Visit (INDEPENDENT_AMBULATORY_CARE_PROVIDER_SITE_OTHER): Payer: Medicare Other | Admitting: Family Medicine

## 2016-03-26 ENCOUNTER — Encounter: Payer: Self-pay | Admitting: Family Medicine

## 2016-03-26 ENCOUNTER — Encounter: Payer: Self-pay | Admitting: Physical Therapy

## 2016-03-26 VITALS — BP 120/72 | HR 68 | Temp 98.1°F | Wt 147.8 lb

## 2016-03-26 DIAGNOSIS — R079 Chest pain, unspecified: Secondary | ICD-10-CM

## 2016-03-26 DIAGNOSIS — R600 Localized edema: Secondary | ICD-10-CM | POA: Diagnosis not present

## 2016-03-26 DIAGNOSIS — E785 Hyperlipidemia, unspecified: Secondary | ICD-10-CM | POA: Diagnosis not present

## 2016-03-26 DIAGNOSIS — M25562 Pain in left knee: Secondary | ICD-10-CM | POA: Diagnosis not present

## 2016-03-26 DIAGNOSIS — I10 Essential (primary) hypertension: Secondary | ICD-10-CM

## 2016-03-26 DIAGNOSIS — M25662 Stiffness of left knee, not elsewhere classified: Secondary | ICD-10-CM | POA: Diagnosis not present

## 2016-03-26 DIAGNOSIS — I480 Paroxysmal atrial fibrillation: Secondary | ICD-10-CM

## 2016-03-26 MED ORDER — NITROGLYCERIN 0.4 MG SL SUBL: 0.4000 mg | SUBLINGUAL_TABLET | SUBLINGUAL | 5 refills | Status: DC | PRN

## 2016-03-26 NOTE — Progress Notes (Signed)
Pre visit review using our clinic review tool, if applicable. No additional management support is needed unless otherwise documented below in the visit note. 

## 2016-03-26 NOTE — Progress Notes (Signed)
OFFICE VISIT  03/26/2016   CC:  Chief Complaint  Patient presents with  . Follow-up    Pt is not fasting.    HPI:    Patient is a 75 y.o. Caucasian female who presents accompanied by her daughter for 4 mo f/u HTN, HLD, PAF. She has had a left total knee arthroplasty by Dr. Alvan Dame since I last saw her. She has started rehab and is at three times a week frequency right now.  A-fib has been well controlled except for an episode during surgery.  She converted to sinus postoperatively.  Most recent labs from the day of hospital d/c were reviewed today.  All normal except mild anemia.  She was put on iron postoperatively but could not tolerate this due to constipation.  She is now feeling fine.  No palp's, sob, CP, or racing heart UNLESS she drinks lots of caffeine. She is eating and drinking well.  No home bp monitoring since I saw her last.   She is taking her statin w/out side effect.  Past Medical History:  Diagnosis Date  . Arthritis    DJD, back and both hips  . Chronic low back pain without sciatica    DDD + scoliosis.  As of 12/04/15, Dr. Nelva Bush will do a back injection and follow her up in 1 mo.  . Diverticulosis    on colonoscopy 2004  . GERD (gastroesophageal reflux disease)   . Hx of cardiovascular stress test    Lexiscan Myoview (2/14):  Low risk, small ant defect likely breast attenuation, no ischemia, EF 69% (no change from 2010).    . Hyperlipidemia   . Hypertension    per pt  . Mitral regurgitation    Echo (2/14):  EF 60-65%, Gr 1 DD, mild AI, mild bileaflet MVP, mod post directed MR, mild LAE, PASP 35.  Marland Kitchen MVP (mitral valve prolapse)   . Osteoporosis, senile    stable at the left hip 2014-2015.  Took fosamax for 10 yrs.  Plan: get repeat DEXA q58mo.  Marland Kitchen PAF (paroxysmal atrial fibrillation) (HCC)    Dr. Martinique: Flecainide, metopr, apxiaban.  . Peripheral neuropathy ([place]) 2008   Idiopathic vs familial (motor>sensory): gabapentin helpful  . PONV (postoperative nausea  and vomiting)   . Scoliosis   . Shoulder pain left   RC surg 09/2014    Past Surgical History:  Procedure Laterality Date  . BREAST SURGERY  Nov 2010   Benign biopsy, right  . CHOLECYSTECTOMY  03/2010   Dr.Gross  . COLONOSCOPY  10/03/2002   No polyps.  Repeat 10 yrs (Dr. Olevia Perches)  . COMBINED HYSTERECTOMY VAGINAL / OOPHORECTOMY / A&P REPAIR  0000000   uncertain if ovaries were removed or not  . CYSTOCELE REPAIR    . Lumpectomy  06/2009   "Fatty Necrosis"  . PFT's  2014   NORMAL  . ROTATOR CUFF REPAIR Left 10/02/14   Dr. Veverly Fells  . TONSILLECTOMY    . TOTAL KNEE ARTHROPLASTY  09/2009   Right ;Dr Alvan Dame  . TOTAL KNEE ARTHROPLASTY Left 03/10/2016   Procedure: TOTAL KNEE ARTHROPLASTY;  Surgeon: Paralee Cancel, MD;  Location: [facility];  Service: Orthopedics;  Laterality: Left;  . TRANSTHORACIC ECHOCARDIOGRAM  09/2012   EF 60-65%, Gr 1 DD, mild AI, mild bileaflet MVP, mod post directed MR, mild LAE, PASP 35.  Marland Kitchen VAGINAL HYSTERECTOMY     For Uterine Deviation     Outpatient Medications Prior to Visit  Medication Sig Dispense Refill  . apixaban (  ELIQUIS) 5 MG TABS tablet Take 1 tablet (5 mg total) by mouth 2 (two) times daily. 60 tablet 11  . Calcium Carbonate (CALCIUM 600 PO) Take 600 mg by mouth 2 (two) times daily.     . cholecalciferol (VITAMIN D) 1000 UNITS tablet Take 5,000 Units by mouth daily.     . cyanocobalamin (,VITAMIN B-12,) 1000 MCG/ML injection Inject 1 mL (1,000 mcg total) into the muscle every 30 (thirty) days. 10 mL 1  . docusate sodium (COLACE) 100 MG capsule Take 1 capsule (100 mg total) by mouth 2 (two) times daily. 10 capsule 0  . flecainide (TAMBOCOR) 50 MG tablet Take 1 tablet (50 mg total) by mouth 2 (two) times daily. 60 tablet 11  . gabapentin (NEURONTIN) 300 MG capsule TAKE 1 CAPSULE BY MOUTH AT LUNCH, 1 AT DINNER, AND 2 AT BEDTIME (Patient taking differently: Take 300 mg by mouth 3 (three) times daily. TAKE 1 CAPSULE BY MOUTH AT LUNCH, 1 AT DINNER, AND 2 AT BEDTIME) 360  capsule 3  . HYDROcodone-acetaminophen (NORCO) 7.5-325 MG tablet Take 1-2 tablets by mouth every 4 (four) hours as needed for moderate pain. 100 tablet 0  . metoprolol tartrate (LOPRESSOR) 25 MG tablet Take 0.5 tablets (12.5 mg total) by mouth 2 (two) times daily.    . Omega-3 Fatty Acids (FISH OIL) 1200 MG CAPS Take 1,000 mg by mouth 2 (two) times daily.     . polyethylene glycol (MIRALAX / GLYCOLAX) packet Take 17 g by mouth 2 (two) times daily. 14 each 0  . pravastatin (PRAVACHOL) 20 MG tablet TAKE 1 TABLET BY MOUTH MON,WED,FRI 90 tablet 3  . nitroGLYCERIN (NITROSTAT) 0.4 MG SL tablet Place 1 tablet (0.4 mg total) under the tongue every 5 (five) minutes as needed for chest pain. 25 tablet 5  . Biotin 5000 MCG CAPS Take 5,000 mcg by mouth daily.    . ferrous sulfate 325 (65 FE) MG tablet Take 1 tablet (325 mg total) by mouth 3 (three) times daily after meals. (Patient not taking: Reported on 03/18/2016)  3  . tiZANidine (ZANAFLEX) 4 MG tablet Take 1 tablet (4 mg total) by mouth every 6 (six) hours as needed for muscle spasms. (Patient not taking: Reported on 03/18/2016) 40 tablet 0   No facility-administered medications prior to visit.     Allergies  Allergen Reactions  . Sulfa Antibiotics Swelling    face  . Tizanidine Other (See Comments)    delirium    ROS As per HPI  PE: Blood pressure 120/72, pulse 68, temperature 98.1 F (36.7 C), temperature source Temporal, weight 147 lb 12.8 oz (67 kg), SpO2 95 %. Gen: Alert, well appearing.  Patient is oriented to person, place, time, and situation. AFFECT: pleasant, lucid thought and speech. CV: RRR, 2/6 mid systolic murmur heard best at left lower sternal border.  no r/g.   LUNGS: CTA bilat, nonlabored resps, good aeration in all lung fields. EXT: trace pitting edema in both LL's.  No cyanosis or clubbing.  LABS:  Lab Results  Component Value Date   WBC 10.3 03/11/2016   HGB 11.2 (L) 03/11/2016   HCT 33.9 (L) 03/11/2016   MCV 90.4  03/11/2016   PLT 167 03/11/2016     Chemistry      Component Value Date/Time   NA 139 03/11/2016 0358   K 4.4 03/11/2016 0358   CL 110 03/11/2016 0358   CO2 23 03/11/2016 0358   BUN 10 03/11/2016 0358   CREATININE 0.71 03/11/2016  0358      Component Value Date/Time   CALCIUM 9.0 03/11/2016 0358   ALKPHOS 66 07/03/2015 0813   AST 16 07/03/2015 0813   ALT 13 07/03/2015 0813   BILITOT 0.7 07/03/2015 0813     Lab Results  Component Value Date   CHOL 220 (H) 07/03/2015   HDL 92.40 07/03/2015   LDLCALC 109 (H) 07/03/2015   LDLDIRECT 87.5 09/07/2012   TRIG 94.0 07/03/2015   CHOLHDL 2 07/03/2015   Lab Results  Component Value Date   TSH 1.63 09/24/2015   IMPRESSION AND PLAN:  1) PAF: doing well.  Continue current cardiac meds + anticoagulant.  2) HTN: The current medical regimen is effective;  continue present plan and medications. Lytes/cr good 2 wks ago.  3) HLD: tolerating statin.  Lipids good 06/2016.  4) S/P left TKA: keep f/u with Dr. Alvan Dame tomorrow.  Pain control and rehab as per Dr. Alvan Dame.  An After Visit Summary was printed and given to the patient.  FOLLOW UP: Return in about 6 months (around 09/26/2016) for medicare AWV.  Signed:  Crissie Sickles, MD           03/26/2016

## 2016-03-26 NOTE — Therapy (Signed)
[facility] [place], [place], [address] Phone: [phone]   Fax:  [phone]  Physical Therapy Treatment  Patient Details  Name: Cassie White MRN: [id number] Date of Birth: 05/17/1941 Referring Provider: Paralee Cancel MD  Encounter Date: 03/26/2016      PT End of Session - 03/26/16 1107    Visit Number 5   Number of Visits 12   Date for PT Re-Evaluation 05/17/16   PT Start Time 1030   PT Stop Time 1120   PT Time Calculation (min) 50 min   Activity Tolerance Patient tolerated treatment well   Behavior During Therapy [facility] for tasks assessed/performed      Past Medical History:  Diagnosis Date  . Arthritis    DJD, back and both hips  . Chronic low back pain without sciatica    DDD + scoliosis.  As of 12/04/15, Dr. Nelva Bush will do a back injection and follow her up in 1 mo.  . Diverticulosis    on colonoscopy 2004  . GERD (gastroesophageal reflux disease)   . Hx of cardiovascular stress test    Lexiscan Myoview (2/14):  Low risk, small ant defect likely breast attenuation, no ischemia, EF 69% (no change from 2010).    . Hyperlipidemia   . Hypertension    per pt  . Mitral regurgitation    Echo (2/14):  EF 60-65%, Gr 1 DD, mild AI, mild bileaflet MVP, mod post directed MR, mild LAE, PASP 35.  Marland Kitchen MVP (mitral valve prolapse)   . Osteoporosis, senile    stable at the left hip 2014-2015.  Took fosamax for 10 yrs.  Plan: get repeat DEXA q76mo.  Marland Kitchen PAF (paroxysmal atrial fibrillation) (HCC)    Dr. Martinique: Flecainide, metopr, apxiaban.  . Peripheral neuropathy ([place]) 2008   Idiopathic vs familial (motor>sensory): gabapentin helpful  . PONV (postoperative nausea and vomiting)   . Scoliosis   . Shoulder pain left   RC surg 09/2014    Past Surgical History:  Procedure Laterality Date  . BREAST SURGERY  Nov 2010   Benign biopsy, right  . CHOLECYSTECTOMY  03/2010   Dr.Gross  . COLONOSCOPY  10/03/2002   No polyps.  Repeat 10  yrs (Dr. Olevia Perches)  . COMBINED HYSTERECTOMY VAGINAL / OOPHORECTOMY / A&P REPAIR  0000000   uncertain if ovaries were removed or not  . CYSTOCELE REPAIR    . Lumpectomy  06/2009   "Fatty Necrosis"  . PFT's  2014   NORMAL  . ROTATOR CUFF REPAIR Left 10/02/14   Dr. Veverly Fells  . TONSILLECTOMY    . TOTAL KNEE ARTHROPLASTY  09/2009   Right ;Dr Alvan Dame  . TOTAL KNEE ARTHROPLASTY Left 03/10/2016   Procedure: TOTAL KNEE ARTHROPLASTY;  Surgeon: Paralee Cancel, MD;  Location: [facility];  Service: Orthopedics;  Laterality: Left;  . TRANSTHORACIC ECHOCARDIOGRAM  09/2012   EF 60-65%, Gr 1 DD, mild AI, mild bileaflet MVP, mod post directed MR, mild LAE, PASP 35.  Marland Kitchen VAGINAL HYSTERECTOMY     For Uterine Deviation     There were no vitals filed for this visit.      Subjective Assessment - 03/26/16 1031    Subjective Pt reports she feels more sore today   Limitations Walking   How long can you walk comfortably? Short distances.   Patient Stated Goals Get out of pain and walk better.   Currently in Pain? Yes   Pain Score 6    Pain Location Knee  Pain Orientation Left   Pain Descriptors / Indicators Sore   Pain Type Surgical pain   Pain Onset 1 to 4 weeks ago            [facility] PT Assessment - 03/26/16 0001      AROM   AROM Assessment Site Knee   Right/Left Knee Left   Left Knee Extension -18   Left Knee Flexion 105                     [facility] Adult PT Treatment/Exercise - 03/26/16 0001      Knee/Hip Exercises: Aerobic   Nustep level 4 x 10 minutes, moving seat from 9-8     Knee/Hip Exercises: Supine   Quad Sets Left;2 sets;10 reps  tactile cues for quad contraction   Short Arc Quad Sets Strengthening;Left;2 sets;10 reps   Heel Slides AROM;Strengthening;Left;2 sets;10 reps   Hip Adduction Isometric Strengthening;20 reps     Acupuncturist Location left knee   Printmaker Action IFC   Electrical Stimulation Parameters 1-10Hz    Electrical  Stimulation Goals Edema;Pain     Vasopneumatic   Number Minutes Vasopneumatic  15 minutes   Vasopnuematic Location  Knee   Vasopneumatic Pressure Medium     Manual Therapy   Manual Therapy Passive ROM   Passive ROM Left knee into extension                  PT Short Term Goals - 03/26/16 1110      PT SHORT TERM GOAL #1   Title Ind with a HEP.   Status Achieved     PT SHORT TERM GOAL #2   Title Full left knee extension.   Status On-going           PT Long Term Goals - 03/26/16 1110      PT LONG TERM GOAL #1   Title Independent with an advanced HEP.   Status On-going     PT LONG TERM GOAL #2   Title Active left  knee flexion to 115 degrees+ so the patient can perform functional tasks and do so with pain not > 2-3/10.   Status On-going     PT LONG TERM GOAL #3   Title Increase left knee strength to a solid 4+/5 to provide good stability for accomplishment of functional activities   Status On-going     PT LONG TERM GOAL #4   Title Decrease edema to within 2 cms of non-affected side to assist with pain reduction and range of motion gains.   Status On-going     PT LONG TERM GOAL #5   Title Perform a reciprocating stair gait with one railing with pain not > 2-3/10.   Status On-going               Plan - 03/26/16 1107    Clinical Impression Statement Pt able to tolerate exercises well today. Continues with decreased knee extension, improving flexion. pain with over pressure into extension but eases with use of modalities. Pt will continue to benefit from PT to improve strength, gait, balance and ROM   Rehab Potential Good   PT Frequency 3x / week   PT Duration 4 weeks   PT Treatment/Interventions Electrical Stimulation;Cryotherapy;Therapeutic activities;Therapeutic exercise;Patient/family education;Passive range of motion;Vasopneumatic Device;Manual techniques   PT Next Visit Plan continue manual to improve ROM, progress strengthening as tolerated    Consulted and Agree with Plan of Care Patient  Patient will benefit from skilled therapeutic intervention in order to improve the following deficits and impairments:  Pain, Increased edema, Decreased strength, Decreased activity tolerance, Decreased range of motion  Visit Diagnosis: Pain in left knee  Stiffness of left knee, not elsewhere classified  Localized edema     Problem List Patient Active Problem List   Diagnosis Date Noted  . 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 Paroxysmal atrial fibrillation with rapid ventricular response ([place]) 09/28/2015  . Maxillary sinusitis, acute 08/27/2015  . Acute bronchitis 09/08/2014  . Mitral insufficiency 05/12/2014  . HTN (hypertension) 04/28/2014  . Lactic acidosis 09/07/2013  . Mitral valve prolapse 06/14/2013  . Chest pain, exertional 09/13/2012  . Pernicious anemia 09/07/2012  . DIVERTICULOSIS, COLON 09/06/2009  . Osteoporosis 09/06/2009  . Hyperlipidemia 09/04/2009  . PERIPHERAL NEUROPATHY 09/04/2009  . LOW BACK PAIN, CHRONIC 04/08/2007    Isabelle Course, PT, DPT 03/26/2016, 11:12 AM  [facility] [address] [place], [place], [address] Phone: [phone]   Fax:  [phone]  Name: Cassie White MRN: [id number] Date of Birth: 1941-07-18

## 2016-03-27 ENCOUNTER — Ambulatory Visit: Payer: Medicare Other | Admitting: Physical Therapy

## 2016-03-27 DIAGNOSIS — Z96652 Presence of left artificial knee joint: Secondary | ICD-10-CM | POA: Diagnosis not present

## 2016-03-27 DIAGNOSIS — Z471 Aftercare following joint replacement surgery: Secondary | ICD-10-CM | POA: Diagnosis not present

## 2016-03-27 DIAGNOSIS — R600 Localized edema: Secondary | ICD-10-CM

## 2016-03-27 DIAGNOSIS — M25562 Pain in left knee: Secondary | ICD-10-CM

## 2016-03-27 DIAGNOSIS — M25662 Stiffness of left knee, not elsewhere classified: Secondary | ICD-10-CM

## 2016-03-27 NOTE — Therapy (Signed)
[facility] [place], [place], [address] Phone: [phone]   Fax:  [phone]  Physical Therapy Treatment  Patient Details  Name: Cassie White MRN: [id number] Date of Birth: 06-26-1941 Referring Provider: Paralee Cancel MD  Encounter Date: 03/27/2016      PT End of Session - 03/27/16 1210    Visit Number 6   Number of Visits 12   Date for PT Re-Evaluation 05/17/16   PT Start Time 1121   PT Stop Time 1215   PT Time Calculation (min) 54 min   Activity Tolerance Patient tolerated treatment well   Behavior During Therapy [facility] for tasks assessed/performed      Past Medical History:  Diagnosis Date  . Arthritis    DJD, back and both hips  . Chronic low back pain without sciatica    DDD + scoliosis.  As of 12/04/15, Dr. Nelva Bush will do a back injection and follow her up in 1 mo.  . Diverticulosis    on colonoscopy 2004  . GERD (gastroesophageal reflux disease)   . Hx of cardiovascular stress test    Lexiscan Myoview (2/14):  Low risk, small ant defect likely breast attenuation, no ischemia, EF 69% (no change from 2010).    . Hyperlipidemia   . Hypertension    per pt  . Mitral regurgitation    Echo (2/14):  EF 60-65%, Gr 1 DD, mild AI, mild bileaflet MVP, mod post directed MR, mild LAE, PASP 35.  Marland Kitchen MVP (mitral valve prolapse)   . Osteoporosis, senile    stable at the left hip 2014-2015.  Took fosamax for 10 yrs.  Plan: get repeat DEXA q49mo.  Marland Kitchen PAF (paroxysmal atrial fibrillation) (HCC)    Dr. Martinique: Flecainide, metopr, apxiaban.  . Peripheral neuropathy ([place]) 2008   Idiopathic vs familial (motor>sensory): gabapentin helpful  . PONV (postoperative nausea and vomiting)   . Scoliosis   . Shoulder pain left   RC surg 09/2014    Past Surgical History:  Procedure Laterality Date  . BREAST SURGERY  Nov 2010   Benign biopsy, right  . CHOLECYSTECTOMY  03/2010   Dr.Gross  . COLONOSCOPY  10/03/2002   No polyps.  Repeat 10  yrs (Dr. Olevia Perches)  . COMBINED HYSTERECTOMY VAGINAL / OOPHORECTOMY / A&P REPAIR  0000000   uncertain if ovaries were removed or not  . CYSTOCELE REPAIR    . Lumpectomy  06/2009   "Fatty Necrosis"  . PFT's  2014   NORMAL  . ROTATOR CUFF REPAIR Left 10/02/14   Dr. Veverly Fells  . TONSILLECTOMY    . TOTAL KNEE ARTHROPLASTY  09/2009   Right ;Dr Alvan Dame  . TOTAL KNEE ARTHROPLASTY Left 03/10/2016   Procedure: TOTAL KNEE ARTHROPLASTY;  Surgeon: Paralee Cancel, MD;  Location: [facility];  Service: Orthopedics;  Laterality: Left;  . TRANSTHORACIC ECHOCARDIOGRAM  09/2012   EF 60-65%, Gr 1 DD, mild AI, mild bileaflet MVP, mod post directed MR, mild LAE, PASP 35.  Marland Kitchen VAGINAL HYSTERECTOMY     For Uterine Deviation     There were no vitals filed for this visit.      Subjective Assessment - 03/27/16 1211    Subjective I'm going to the Dr. today.   Pain Score 5    Pain Location Knee   Pain Orientation Left   Pain Descriptors / Indicators Sore   Pain Type Surgical pain   Pain Onset 1 to 4 weeks ago  [facility] Adult PT Treatment/Exercise - 03/27/16 0001      Exercises   Exercises Knee/Hip     Knee/Hip Exercises: Aerobic   Nustep Level 4 moving forward x 3 to increase flexion over 20 minutes.     Knee/Hip Exercises: Supine   Short Arc Quad Sets Limitations SAQ's (non-resisted) x 18 minutes faciliated with VMS with 10 sec extendion holds and 10 sec rest.     Vasopneumatic   Number Minutes Vasopneumatic  10 minutes   Vasopnuematic Location  --  Left knee.   Vasopneumatic Pressure Medium                  PT Short Term Goals - 03/26/16 1110      PT SHORT TERM GOAL #1   Title Ind with a HEP.   Status Achieved     PT SHORT TERM GOAL #2   Title Full left knee extension.   Status On-going           PT Long Term Goals - 03/26/16 1110      PT LONG TERM GOAL #1   Title Independent with an advanced HEP.   Status On-going     PT LONG TERM GOAL #2    Title Active left  knee flexion to 115 degrees+ so the patient can perform functional tasks and do so with pain not > 2-3/10.   Status On-going     PT LONG TERM GOAL #3   Title Increase left knee strength to a solid 4+/5 to provide good stability for accomplishment of functional activities   Status On-going     PT LONG TERM GOAL #4   Title Decrease edema to within 2 cms of non-affected side to assist with pain reduction and range of motion gains.   Status On-going     PT LONG TERM GOAL #5   Title Perform a reciprocating stair gait with one railing with pain not > 2-3/10.   Status On-going               Plan - 03/27/16 1208    Clinical Impression Statement The patient is progressing well with PT though she currently lacks 15 degrees of full left knee extension.  Today she exhibited 105 degrees of active left knee flexion and passive= 110 degrees.      Patient will benefit from skilled therapeutic intervention in order to improve the following deficits and impairments:     Visit Diagnosis: Pain in left knee  Stiffness of left knee, not elsewhere classified  Localized edema     Problem List Patient Active Problem List   Diagnosis Date Noted  . 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 Paroxysmal atrial fibrillation with rapid ventricular response ([place]) 09/28/2015  . Maxillary sinusitis, acute 08/27/2015  . Acute bronchitis 09/08/2014  . Mitral insufficiency 05/12/2014  . HTN (hypertension) 04/28/2014  . Lactic acidosis 09/07/2013  . Mitral valve prolapse 06/14/2013  . Chest pain, exertional 09/13/2012  . Pernicious anemia 09/07/2012  . DIVERTICULOSIS, COLON 09/06/2009  . Osteoporosis 09/06/2009  . Hyperlipidemia 09/04/2009  . PERIPHERAL NEUROPATHY 09/04/2009  . LOW BACK PAIN, CHRONIC 04/08/2007    Zayda Angell, [place] 03/27/2016, 12:25 PM  [facility] [address] [place], [place],  [address] Phone: [phone]   Fax:  [phone]  Name: SHARDONNAY TABOR MRN: [id number] Date of Birth: 10-27-1940

## 2016-03-27 NOTE — Therapy (Signed)
[facility] [place], [place], [address] Phone: [phone]   Fax:  [phone]  Physical Therapy Treatment  Patient Details  Name: Cassie White MRN: [id number] Date of Birth: 10/15/1940 Referring Provider: Paralee Cancel MD  Encounter Date: 03/27/2016      PT End of Session - 03/27/16 1210    Visit Number 6   Number of Visits 12   Date for PT Re-Evaluation 05/17/16   PT Start Time 1121      Past Medical History:  Diagnosis Date  . Arthritis    DJD, back and both hips  . Chronic low back pain without sciatica    DDD + scoliosis.  As of 12/04/15, Dr. Nelva Bush will do a back injection and follow her up in 1 mo.  . Diverticulosis    on colonoscopy 2004  . GERD (gastroesophageal reflux disease)   . Hx of cardiovascular stress test    Lexiscan Myoview (2/14):  Low risk, small ant defect likely breast attenuation, no ischemia, EF 69% (no change from 2010).    . Hyperlipidemia   . Hypertension    per pt  . Mitral regurgitation    Echo (2/14):  EF 60-65%, Gr 1 DD, mild AI, mild bileaflet MVP, mod post directed MR, mild LAE, PASP 35.  Marland Kitchen MVP (mitral valve prolapse)   . Osteoporosis, senile    stable at the left hip 2014-2015.  Took fosamax for 10 yrs.  Plan: get repeat DEXA q3mo.  Marland Kitchen PAF (paroxysmal atrial fibrillation) (HCC)    Dr. [place]: Flecainide, metopr, apxiaban.  . Peripheral neuropathy ([place]) 2008   Idiopathic vs familial (motor>sensory): gabapentin helpful  . PONV (postoperative nausea and vomiting)   . Scoliosis   . Shoulder pain left   RC surg 09/2014    Past Surgical History:  Procedure Laterality Date  . BREAST SURGERY  Nov 2010   Benign biopsy, right  . CHOLECYSTECTOMY  03/2010   Dr.Gross  . COLONOSCOPY  10/03/2002   No polyps.  Repeat 10 yrs (Dr. Olevia Perches)  . COMBINED HYSTERECTOMY VAGINAL / OOPHORECTOMY / A&P REPAIR  0000000   uncertain if ovaries were removed or not  . CYSTOCELE REPAIR    . Lumpectomy   06/2009   "Fatty Necrosis"  . PFT's  2014   NORMAL  . ROTATOR CUFF REPAIR Left 10/02/14   Dr. Veverly Fells  . TONSILLECTOMY    . TOTAL KNEE ARTHROPLASTY  09/2009   Right ;Dr Alvan Dame  . TOTAL KNEE ARTHROPLASTY Left 03/10/2016   Procedure: TOTAL KNEE ARTHROPLASTY;  Surgeon: Paralee Cancel, MD;  Location: [facility];  Service: Orthopedics;  Laterality: Left;  . TRANSTHORACIC ECHOCARDIOGRAM  09/2012   EF 60-65%, Gr 1 DD, mild AI, mild bileaflet MVP, mod post directed MR, mild LAE, PASP 35.  Marland Kitchen VAGINAL HYSTERECTOMY     For Uterine Deviation     There were no vitals filed for this visit.      Subjective Assessment - 03/27/16 1211    Subjective I'm going to the Dr. today.   Pain Score 5    Pain Location Knee   Pain Orientation Left   Pain Descriptors / Indicators Sore   Pain Type Surgical pain   Pain Onset 1 to 4 weeks ago                         [facility] Adult PT Treatment/Exercise - 03/27/16 0001      Exercises  Exercises Knee/Hip     Knee/Hip Exercises: Aerobic   Nustep Level 4 moving forwrad x 3 to increase flexion over 20 minutes.     Knee/Hip Exercises: Supine   Short Arc Quad Sets Limitations SAQ's (non-resisted) x 18 minutes faciliated with VMS with 10 sec extendion holds and 10 sec rest.     Vasopneumatic   Number Minutes Vasopneumatic  10 minutes   Vasopnuematic Location  --  Left knee.   Vasopneumatic Pressure Medium                  PT Short Term Goals - 03/26/16 1110      PT SHORT TERM GOAL #1   Title Ind with a HEP.   Status Achieved     PT SHORT TERM GOAL #2   Title Full left knee extension.   Status On-going           PT Long Term Goals - 03/26/16 1110      PT LONG TERM GOAL #1   Title Independent with an advanced HEP.   Status On-going     PT LONG TERM GOAL #2   Title Active left  knee flexion to 115 degrees+ so the patient can perform functional tasks and do so with pain not > 2-3/10.   Status On-going     PT LONG TERM GOAL #3    Title Increase left knee strength to a solid 4+/5 to provide good stability for accomplishment of functional activities   Status On-going     PT LONG TERM GOAL #4   Title Decrease edema to within 2 cms of non-affected side to assist with pain reduction and range of motion gains.   Status On-going     PT LONG TERM GOAL #5   Title Perform a reciprocating stair gait with one railing with pain not > 2-3/10.   Status On-going               Plan - 03/27/16 1208    Clinical Impression Statement The patient is progressing well with PT though she currently lacks 15 degrees of full left knee extension.  Today she exhibited 105 degrees of active left knee flexion and passive= 110 degrees.      Patient will benefit from skilled therapeutic intervention in order to improve the following deficits and impairments:     Visit Diagnosis: Pain in left knee  Stiffness of left knee, not elsewhere classified  Localized edema     Problem List Patient Active Problem List   Diagnosis Date Noted  . 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 Paroxysmal atrial fibrillation with rapid ventricular response ([place]) 09/28/2015  . Maxillary sinusitis, acute 08/27/2015  . Acute bronchitis 09/08/2014  . Mitral insufficiency 05/12/2014  . HTN (hypertension) 04/28/2014  . Lactic acidosis 09/07/2013  . Mitral valve prolapse 06/14/2013  . Chest pain, exertional 09/13/2012  . Pernicious anemia 09/07/2012  . DIVERTICULOSIS, COLON 09/06/2009  . Osteoporosis 09/06/2009  . Hyperlipidemia 09/04/2009  . PERIPHERAL NEUROPATHY 09/04/2009  . LOW BACK PAIN, CHRONIC 04/08/2007    Shanel Prazak, [place] MPT 03/27/2016, 12:15 PM  [facility] [address] [place], [place], [address] Phone: [phone]   Fax:  [phone]  Name: Cassie White MRN: [id number] Date of Birth: [birth year]

## 2016-03-31 ENCOUNTER — Ambulatory Visit: Payer: Medicare Other | Admitting: Physical Therapy

## 2016-03-31 DIAGNOSIS — M25562 Pain in left knee: Secondary | ICD-10-CM | POA: Diagnosis not present

## 2016-03-31 DIAGNOSIS — M25662 Stiffness of left knee, not elsewhere classified: Secondary | ICD-10-CM | POA: Diagnosis not present

## 2016-03-31 DIAGNOSIS — R600 Localized edema: Secondary | ICD-10-CM

## 2016-03-31 NOTE — Therapy (Signed)
[facility] [place], [place], [address] Phone: [phone]   Fax:  [phone]  Physical Therapy Treatment  Patient Details  Name: Cassie White MRN: [id number] Date of Birth: 02-Jun-1941 Referring Provider: Paralee Cancel MD  Encounter Date: 03/31/2016      PT End of Session - 03/31/16 1241    Visit Number 7   Number of Visits 12   Date for PT Re-Evaluation 05/17/16   PT Start Time 1030   PT Stop Time 1133   PT Time Calculation (min) 63 min   Activity Tolerance Patient tolerated treatment well   Behavior During Therapy [facility] for tasks assessed/performed      Past Medical History:  Diagnosis Date  . Arthritis    DJD, back and both hips  . Chronic low back pain without sciatica    DDD + scoliosis.  As of 12/04/15, Dr. Nelva Bush will do a back injection and follow her up in 1 mo.  . Diverticulosis    on colonoscopy 2004  . GERD (gastroesophageal reflux disease)   . Hx of cardiovascular stress test    Lexiscan Myoview (2/14):  Low risk, small ant defect likely breast attenuation, no ischemia, EF 69% (no change from 2010).    . Hyperlipidemia   . Hypertension    per pt  . Mitral regurgitation    Echo (2/14):  EF 60-65%, Gr 1 DD, mild AI, mild bileaflet MVP, mod post directed MR, mild LAE, PASP 35.  Marland Kitchen MVP (mitral valve prolapse)   . Osteoporosis, senile    stable at the left hip 2014-2015.  Took fosamax for 10 yrs.  Plan: get repeat DEXA q8mo.  Marland Kitchen PAF (paroxysmal atrial fibrillation) (HCC)    Dr. Martin[place]: Flecainide, metopr, apxiaban.  . Peripheral neuropathy ([place]) 2008   Idiopathic vs familial (motor>sensory): gabapentin helpful  . PONV (postoperative nausea and vomiting)   . Scoliosis   . Shoulder pain left   RC surg 09/2014    Past Surgical History:  Procedure Laterality Date  . BREAST SURGERY  Nov 2010   Benign biopsy, right  . CHOLECYSTECTOMY  03/2010   Dr.Gross  . COLONOSCOPY  10/03/2002   No polyps.  Repeat 10  yrs (Dr. Olevia Perches)  . COMBINED HYSTERECTOMY VAGINAL / OOPHORECTOMY / A&P REPAIR  0000000   uncertain if ovaries were removed or not  . CYSTOCELE REPAIR    . Lumpectomy  06/2009   "Fatty Necrosis"  . PFT's  2014   NORMAL  . ROTATOR CUFF REPAIR Left 10/02/14   Dr. Veverly Fells  . TONSILLECTOMY    . TOTAL KNEE ARTHROPLASTY  09/2009   Right ;Dr Alvan Dame  . TOTAL KNEE ARTHROPLASTY Left 03/10/2016   Procedure: TOTAL KNEE ARTHROPLASTY;  Surgeon: Paralee Cancel, MD;  Location: [facility];  Service: Orthopedics;  Laterality: Left;  . TRANSTHORACIC ECHOCARDIOGRAM  09/2012   EF 60-65%, Gr 1 DD, mild AI, mild bileaflet MVP, mod post directed MR, mild LAE, PASP 35.  Marland Kitchen VAGINAL HYSTERECTOMY     For Uterine Deviation     There were no vitals filed for this visit.      Subjective Assessment - 03/31/16 1242    Subjective The doctor was well pleased with my progress.  He said I was doing good.  My knee swelled quite a bit over the weekend but I was up a lot.  Doctor said I can start using a cane around the house but I'm not confident enough yet.   Limitations  Walking   How long can you walk comfortably? Short distances.   Patient Stated Goals Get out of pain and walk better.   Pain Score 5    Pain Location Knee   Pain Orientation Left   Pain Descriptors / Indicators Sore   Pain Type Surgical pain   Pain Onset 1 to 4 weeks ago   Pain Frequency Intermittent                         [facility] Adult PT Treatment/Exercise - 03/31/16 0001      Exercises   Exercises Knee/Hip     Knee/Hip Exercises: Aerobic   Nustep Level 2  moving forward x 3 to increase left knee flexion. over 20 minutes.     Knee/Hip Exercises: Supine   Short Arc Quad Sets Limitations SAQ's x 18 minutes facilitated with VMS to left quadriceps (10 sec extension holds and 10 sec rest).     Modalities   Modalities Cryotherapy;Electrical Stimulation     Cryotherapy   Number Minutes Cryotherapy 15 Minutes   Cryotherapy Location --   Left knee.   Type of Cryotherapy Ice pack     Electrical Stimulation   Electrical Stimulation Location Left knee.   Electrical Stimulation Action IFC at 1-10 Hz x 15 minutes.   Electrical Stimulation Goals Edema;Pain                  PT Short Term Goals - 03/26/16 1110      PT SHORT TERM GOAL #1   Title Ind with a HEP.   Status Achieved     PT SHORT TERM GOAL #2   Title Full left knee extension.   Status On-going           PT Long Term Goals - 03/26/16 1110      PT LONG TERM GOAL #1   Title Independent with an advanced HEP.   Status On-going     PT LONG TERM GOAL #2   Title Active left  knee flexion to 115 degrees+ so the patient can perform functional tasks and do so with pain not > 2-3/10.   Status On-going     PT LONG TERM GOAL #3   Title Increase left knee strength to a solid 4+/5 to provide good stability for accomplishment of functional activities   Status On-going     PT LONG TERM GOAL #4   Title Decrease edema to within 2 cms of non-affected side to assist with pain reduction and range of motion gains.   Status On-going     PT LONG TERM GOAL #5   Title Perform a reciprocating stair gait with one railing with pain not > 2-3/10.   Status On-going             Patient will benefit from skilled therapeutic intervention in order to improve the following deficits and impairments:     Visit Diagnosis: Pain in left knee  Stiffness of left knee, not elsewhere classified  Localized edema     Problem List Patient Active Problem List   Diagnosis Date Noted  . 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 Paroxysmal atrial fibrillation with rapid ventricular response ([place]) 09/28/2015  . Maxillary sinusitis, acute 08/27/2015  . Acute bronchitis 09/08/2014  . Mitral insufficiency 05/12/2014  . HTN (hypertension) 04/28/2014  . Lactic acidosis 09/07/2013  . Mitral valve prolapse 06/14/2013  . Chest pain, exertional  09/13/2012  .  Pernicious anemia 09/07/2012  . DIVERTICULOSIS, COLON 09/06/2009  . Osteoporosis 09/06/2009  . Hyperlipidemia 09/04/2009  . PERIPHERAL NEUROPATHY 09/04/2009  . LOW BACK PAIN, CHRONIC 04/08/2007    Herbert Marken, [place] MPT 03/31/2016, 12:49 PM  [facility] [address] [place], [place], [address] Phone: [phone]   Fax:  [phone]  Name: Cassie White MRN: [id number] Date of Birth: Aug 28, 1940

## 2016-04-02 ENCOUNTER — Ambulatory Visit: Payer: Medicare Other | Admitting: Physical Therapy

## 2016-04-02 ENCOUNTER — Encounter: Payer: Self-pay | Admitting: Physical Therapy

## 2016-04-02 DIAGNOSIS — M25562 Pain in left knee: Secondary | ICD-10-CM | POA: Diagnosis not present

## 2016-04-02 DIAGNOSIS — M25662 Stiffness of left knee, not elsewhere classified: Secondary | ICD-10-CM | POA: Diagnosis not present

## 2016-04-02 DIAGNOSIS — R600 Localized edema: Secondary | ICD-10-CM

## 2016-04-02 NOTE — Therapy (Signed)
[facility] [place], [place], [address] Phone: [phone]   Fax:  [phone]  Physical Therapy Treatment  Patient Details  Name: Cassie White MRN: [id number] Date of Birth: [birth year] Referring Provider: Paralee Cancel MD  Encounter Date: 04/02/2016      PT End of Session - 04/02/16 1259    Visit Number 8   Number of Visits 12   Date for PT Re-Evaluation 05/17/16   PT Start Time 1229   PT Stop Time 1330   PT Time Calculation (min) 61 min   Activity Tolerance Patient tolerated treatment well   Behavior During Therapy [facility] [place] [facility] for tasks assessed/performed      Past Medical History:  Diagnosis Date  . Arthritis    DJD, back and both hips  . Chronic low back pain without sciatica    DDD + scoliosis.  As of 12/04/15, Dr. Nelva Bush will do a back injection and follow her up in 1 mo.  . Diverticulosis    on colonoscopy 2004  . GERD (gastroesophageal reflux disease)   . Hx of cardiovascular stress test    Lexiscan Myoview (2/14):  Low risk, small ant defect likely breast attenuation, no ischemia, EF 69% (no change from 2010).    . Hyperlipidemia   . Hypertension    per pt  . Mitral regurgitation    Echo (2/14):  EF 60-65%, Gr 1 DD, mild AI, mild bileaflet MVP, mod post directed MR, mild LAE, PASP 35.  Marland Kitchen MVP (mitral valve prolapse)   . Osteoporosis, senile    stable at the left hip 2014-2015.  Took fosamax for 10 yrs.  Plan: get repeat DEXA q19mo.  Marland Kitchen PAF (paroxysmal atrial fibrillation) (HCC)    Dr. [place]: Flecainide, metopr, apxiaban.  . Peripheral neuropathy ([place]) 2008   Idiopathic vs familial (motor>sensory): gabapentin helpful  . PONV (postoperative nausea and vomiting)   . Scoliosis   . Shoulder pain left   RC surg 09/2014    Past Surgical History:  Procedure Laterality Date  . BREAST SURGERY  Nov 2010   Benign biopsy, right  . CHOLECYSTECTOMY  03/2010   Dr.Gross  . COLONOSCOPY  10/03/2002   No polyps.  Repeat 10  yrs (Dr. Olevia Perches)  . COMBINED HYSTERECTOMY VAGINAL / OOPHORECTOMY / A&P REPAIR  0000000   uncertain if ovaries were removed or not  . CYSTOCELE REPAIR    . Lumpectomy  06/2009   "Fatty Necrosis"  . PFT's  2014   NORMAL  . ROTATOR CUFF REPAIR Left 10/02/14   Dr. Veverly Fells  . TONSILLECTOMY    . TOTAL KNEE ARTHROPLASTY  09/2009   Right ;Dr Alvan Dame  . TOTAL KNEE ARTHROPLASTY Left 03/10/2016   Procedure: TOTAL KNEE ARTHROPLASTY;  Surgeon: Paralee Cancel, MD;  Location: [facility];  Service: Orthopedics;  Laterality: Left;  . TRANSTHORACIC ECHOCARDIOGRAM  09/2012   EF 60-65%, Gr 1 DD, mild AI, mild bileaflet MVP, mod post directed MR, mild LAE, PASP 35.  Marland Kitchen VAGINAL HYSTERECTOMY     For Uterine Deviation     There were no vitals filed for this visit.      Subjective Assessment - 04/02/16 1234    Subjective Patient feels better each day and still using a FWW today   Patient is accompained by: Family member   Limitations Walking   How long can you walk comfortably? Short distances.   Patient Stated Goals Get out of pain and walk better.   Currently in Pain? Yes  Pain Score 4    Pain Location Knee   Pain Orientation Left   Pain Descriptors / Indicators Sore   Pain Type Surgical pain   Pain Onset 1 to 4 weeks ago   Pain Frequency Intermittent   Aggravating Factors  ROM and prolong activity   Pain Relieving Factors at rest            [facility] PT Assessment - 04/02/16 0001      AROM   AROM Assessment Site Knee   Right/Left Knee Left   Left Knee Extension -18   Left Knee Flexion 102     PROM   PROM Assessment Site Knee   Right/Left Knee Left   Left Knee Extension -13   Left Knee Flexion 115                     [facility] Adult PT Treatment/Exercise - 04/02/16 0001      Knee/Hip Exercises: Aerobic   Nustep 39min L5     Electrical Stimulation   Electrical Stimulation Location left knee   Electrical Stimulation Action VMS to VMO   Electrical Stimulation Parameters 10/10 x59min  with SAQ for activation   Electrical Stimulation Goals Neuromuscular facilitation;Strength     Vasopneumatic   Number Minutes Vasopneumatic  15 minutes   Vasopnuematic Location  Knee   Vasopneumatic Pressure Medium     Manual Therapy   Manual Therapy Passive ROM   Passive ROM gentle stretching for left knee flexion and ext with low load holds                  PT Short Term Goals - 03/26/16 1110      PT SHORT TERM GOAL #1   Title Ind with a HEP.   Status Achieved     PT SHORT TERM GOAL #2   Title Full left knee extension.   Status On-going           PT Long Term Goals - 03/26/16 1110      PT LONG TERM GOAL #1   Title Independent with an advanced HEP.   Status On-going     PT LONG TERM GOAL #2   Title Active left  knee flexion to 115 degrees+ so the patient can perform functional tasks and do so with pain not > 2-3/10.   Status On-going     PT LONG TERM GOAL #3   Title Increase left knee strength to a solid 4+/5 to provide good stability for accomplishment of functional activities   Status On-going     PT LONG TERM GOAL #4   Title Decrease edema to within 2 cms of non-affected side to assist with pain reduction and range of motion gains.   Status On-going     PT LONG TERM GOAL #5   Title Perform a reciprocating stair gait with one railing with pain not > 2-3/10.   Status On-going               Plan - 04/02/16 1302    Clinical Impression Statement Patient progressing with overall less pain and has improved PROM for left knee flexion today. Patient tolerated treatment well today. Patient has reported using cane some at home yet not comfortable to use all the time. May try cane in clinic the next visit or two when patient able. Patient goals progressing yet ongoing at this time due to ROM, pain and strength deficits.    Rehab Potential Good   PT  Frequency 3x / week   PT Duration 4 weeks   PT Treatment/Interventions Electrical  Stimulation;Cryotherapy;Therapeutic activities;Therapeutic exercise;Patient/family education;Passive range of motion;Vasopneumatic Device;Manual techniques   PT Next Visit Plan continue manual to improve ROM, progress strengthening as tolerated   Consulted and Agree with Plan of Care Patient      Patient will benefit from skilled therapeutic intervention in order to improve the following deficits and impairments:  Pain, Increased edema, Decreased strength, Decreased activity tolerance, Decreased range of motion  Visit Diagnosis: Pain in left knee  Stiffness of left knee, not elsewhere classified  Localized edema     Problem List Patient Active Problem List   Diagnosis Date Noted  . 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 Paroxysmal atrial fibrillation with rapid ventricular response ([place]) 09/28/2015  . Maxillary sinusitis, acute 08/27/2015  . Acute bronchitis 09/08/2014  . Mitral insufficiency 05/12/2014  . HTN (hypertension) 04/28/2014  . Lactic acidosis 09/07/2013  . Mitral valve prolapse 06/14/2013  . Chest pain, exertional 09/13/2012  . Pernicious anemia 09/07/2012  . DIVERTICULOSIS, COLON 09/06/2009  . Osteoporosis 09/06/2009  . Hyperlipidemia 09/04/2009  . PERIPHERAL NEUROPATHY 09/04/2009  . LOW BACK PAIN, CHRONIC 04/08/2007    Alycia Cooperwood P, PTA 04/02/2016, 1:32 PM  [facility] [address] [place], [place], [address] Phone: [phone]   Fax:  [phone]  Name: Cassie White MRN: [id number] Date of Birth: Apr 15, 1941

## 2016-04-04 ENCOUNTER — Ambulatory Visit: Payer: Medicare Other | Attending: Orthopedic Surgery | Admitting: *Deleted

## 2016-04-04 DIAGNOSIS — M25662 Stiffness of left knee, not elsewhere classified: Secondary | ICD-10-CM | POA: Diagnosis not present

## 2016-04-04 DIAGNOSIS — R600 Localized edema: Secondary | ICD-10-CM | POA: Diagnosis not present

## 2016-04-04 DIAGNOSIS — M25562 Pain in left knee: Secondary | ICD-10-CM | POA: Diagnosis not present

## 2016-04-04 NOTE — Therapy (Signed)
[facility] [place], [place], [address] Phone: [phone]   Fax:  [phone]  Physical Therapy Treatment  Patient Details  Name: Cassie White MRN: [id number] Date of Birth: Oct 06, 1940 Referring Provider: Paralee Cancel MD  Encounter Date: 04/04/2016    Past Medical History:  Diagnosis Date  . Arthritis    DJD, back and both hips  . Chronic low back pain without sciatica    DDD + scoliosis.  As of 12/04/15, Dr. Nelva Bush will do a back injection and follow her up in 1 mo.  . Diverticulosis    on colonoscopy 2004  . GERD (gastroesophageal reflux disease)   . Hx of cardiovascular stress test    Lexiscan Myoview (2/14):  Low risk, small ant defect likely breast attenuation, no ischemia, EF 69% (no change from 2010).    . Hyperlipidemia   . Hypertension    per pt  . Mitral regurgitation    Echo (2/14):  EF 60-65%, Gr 1 DD, mild AI, mild bileaflet MVP, mod post directed MR, mild LAE, PASP 35.  Marland Kitchen MVP (mitral valve prolapse)   . Osteoporosis, senile    stable at the left hip 2014-2015.  Took fosamax for 10 yrs.  Plan: get repeat DEXA q61mo.  Marland Kitchen PAF (paroxysmal atrial fibrillation) (HCC)    Dr. Martinique: Flecainide, metopr, apxiaban.  . Peripheral neuropathy ([place]) 2008   Idiopathic vs familial (motor>sensory): gabapentin helpful  . PONV (postoperative nausea and vomiting)   . Scoliosis   . Shoulder pain left   RC surg 09/2014    Past Surgical History:  Procedure Laterality Date  . BREAST SURGERY  Nov 2010   Benign biopsy, right  . CHOLECYSTECTOMY  03/2010   Dr.Gross  . COLONOSCOPY  10/03/2002   No polyps.  Repeat 10 yrs (Dr. Olevia Perches)  . COMBINED HYSTERECTOMY VAGINAL / OOPHORECTOMY / A&P REPAIR  0000000   uncertain if ovaries were removed or not  . CYSTOCELE REPAIR    . Lumpectomy  06/2009   "Fatty Necrosis"  . PFT's  2014   NORMAL  . ROTATOR CUFF REPAIR Left 10/02/14   Dr. Veverly Fells  . TONSILLECTOMY    . TOTAL KNEE  ARTHROPLASTY  09/2009   Right ;Dr Alvan Dame  . TOTAL KNEE ARTHROPLASTY Left 03/10/2016   Procedure: TOTAL KNEE ARTHROPLASTY;  Surgeon: Paralee Cancel, MD;  Location: [facility];  Service: Orthopedics;  Laterality: Left;  . TRANSTHORACIC ECHOCARDIOGRAM  09/2012   EF 60-65%, Gr 1 DD, mild AI, mild bileaflet MVP, mod post directed MR, mild LAE, PASP 35.  Marland Kitchen VAGINAL HYSTERECTOMY     For Uterine Deviation     There were no vitals filed for this visit.      Subjective Assessment - 04/04/16 1053    Subjective Had some increased soreness in LT calf   Patient is accompained by: Family member   Limitations Walking   How long can you walk comfortably? Short distances.   Patient Stated Goals Get out of pain and walk better.   Currently in Pain? Yes   Pain Score 7    Pain Location Knee   Pain Orientation Left   Pain Descriptors / Indicators Sore   Pain Type Surgical pain   Pain Onset 1 to 4 weeks ago                         [facility] Adult PT Treatment/Exercise - 04/04/16 0001      Exercises  Exercises Knee/Hip     Knee/Hip Exercises: Aerobic   Nustep L5 x 15 mins with progression for flexion     Modalities   Modalities Cryotherapy;Social worker Location left knee Premod x 15 mins to 80-150hz    Electrical Stimulation Goals Pain;Edema     Vasopneumatic   Number Minutes Vasopneumatic  15 minutes   Vasopnuematic Location  Knee   Vasopneumatic Pressure Medium   Vasopneumatic Temperature  34     Manual Therapy   Manual Therapy Passive ROM;Soft tissue mobilization   Soft tissue mobilization STW to medial calf  and  scar mobs   Passive ROM gentle stretching for left knee flexion and ext with low load holds     Rocker board x 5 mins calf stretching.   LAQs with DF at top for calf stretch x 20              PT Short Term Goals - 03/26/16 1110      PT SHORT TERM GOAL #1   Title Ind with a HEP.   Status Achieved      PT SHORT TERM GOAL #2   Title Full left knee extension.   Status On-going           PT Long Term Goals - 03/26/16 1110      PT LONG TERM GOAL #1   Title Independent with an advanced HEP.   Status On-going     PT LONG TERM GOAL #2   Title Active left  knee flexion to 115 degrees+ so the patient can perform functional tasks and do so with pain not > 2-3/10.   Status On-going     PT LONG TERM GOAL #3   Title Increase left knee strength to a solid 4+/5 to provide good stability for accomplishment of functional activities   Status On-going     PT LONG TERM GOAL #4   Title Decrease edema to within 2 cms of non-affected side to assist with pain reduction and range of motion gains.   Status On-going     PT LONG TERM GOAL #5   Title Perform a reciprocating stair gait with one railing with pain not > 2-3/10.   Status On-going             Patient will benefit from skilled therapeutic intervention in order to improve the following deficits and impairments:     Visit Diagnosis: Pain in left knee  Stiffness of left knee, not elsewhere classified  Localized edema     Problem List Patient Active Problem List   Diagnosis Date Noted  . 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 Paroxysmal atrial fibrillation with rapid ventricular response ([place]) 09/28/2015  . Maxillary sinusitis, acute 08/27/2015  . Acute bronchitis 09/08/2014  . Mitral insufficiency 05/12/2014  . HTN (hypertension) 04/28/2014  . Lactic acidosis 09/07/2013  . Mitral valve prolapse 06/14/2013  . Chest pain, exertional 09/13/2012  . Pernicious anemia 09/07/2012  . DIVERTICULOSIS, COLON 09/06/2009  . Osteoporosis 09/06/2009  . Hyperlipidemia 09/04/2009  . PERIPHERAL NEUROPATHY 09/04/2009  . LOW BACK PAIN, CHRONIC 04/08/2007    Joelie Schou,CHRIS, PTA 04/04/2016, 12:50 PM  [facility] [address] [place], [place], [address] Phone:  [phone]   Fax:  [phone]  Name: Cassie White MRN: [id number] Date of Birth: [birth year]

## 2016-04-08 ENCOUNTER — Encounter: Payer: Self-pay | Admitting: Physical Therapy

## 2016-04-08 ENCOUNTER — Ambulatory Visit: Payer: Medicare Other | Admitting: Physical Therapy

## 2016-04-08 DIAGNOSIS — M25562 Pain in left knee: Secondary | ICD-10-CM

## 2016-04-08 DIAGNOSIS — M25662 Stiffness of left knee, not elsewhere classified: Secondary | ICD-10-CM

## 2016-04-08 DIAGNOSIS — R600 Localized edema: Secondary | ICD-10-CM | POA: Diagnosis not present

## 2016-04-08 NOTE — Therapy (Signed)
[facility] [place], [place], [address] Phone: [phone]   Fax:  [phone]  Physical Therapy Treatment  Patient Details  Name: Cassie White MRN: [id number] Date of Birth: [birth year] Referring Provider: Paralee Cancel MD  Encounter Date: 04/08/2016      PT End of Session - 04/08/16 1032    Visit Number 9   Number of Visits 12   Date for PT Re-Evaluation 05/17/16   PT Start Time 1030   PT Stop Time 1124   PT Time Calculation (min) 54 min   Activity Tolerance Patient tolerated treatment well   Behavior During Therapy [facility] for tasks assessed/performed      Past Medical History:  Diagnosis Date  . Arthritis    DJD, back and both hips  . Chronic low back pain without sciatica    DDD + scoliosis.  As of 12/04/15, Dr. Nelva Bush will do a back injection and follow her up in 1 mo.  . Diverticulosis    on colonoscopy 2004  . GERD (gastroesophageal reflux disease)   . Hx of cardiovascular stress test    Lexiscan Myoview (2/14):  Low risk, small ant defect likely breast attenuation, no ischemia, EF 69% (no change from 2010).    . Hyperlipidemia   . Hypertension    per pt  . Mitral regurgitation    Echo (2/14):  EF 60-65%, Gr 1 DD, mild AI, mild bileaflet MVP, mod post directed MR, mild LAE, PASP 35.  Marland Kitchen MVP (mitral valve prolapse)   . Osteoporosis, senile    stable at the left hip 2014-2015.  Took fosamax for 10 yrs.  Plan: get repeat DEXA q27mo.  Marland Kitchen PAF (paroxysmal atrial fibrillation) (HCC)    Dr. Martinique: Flecainide, metopr, apxiaban.  . Peripheral neuropathy ([place]) 2008   Idiopathic vs familial (motor>sensory): gabapentin helpful  . PONV (postoperative nausea and vomiting)   . Scoliosis   . Shoulder pain left   RC surg 09/2014    Past Surgical History:  Procedure Laterality Date  . BREAST SURGERY  Nov 2010   Benign biopsy, right  . CHOLECYSTECTOMY  03/2010   Dr.Gross  . COLONOSCOPY  10/03/2002   No polyps.  Repeat 10  yrs (Dr. Olevia Perches)  . COMBINED HYSTERECTOMY VAGINAL / OOPHORECTOMY / A&P REPAIR  0000000   uncertain if ovaries were removed or not  . CYSTOCELE REPAIR    . Lumpectomy  06/2009   "Fatty Necrosis"  . PFT's  2014   NORMAL  . ROTATOR CUFF REPAIR Left 10/02/14   Dr. Veverly Fells  . TONSILLECTOMY    . TOTAL KNEE ARTHROPLASTY  09/2009   Right ;Dr Alvan Dame  . TOTAL KNEE ARTHROPLASTY Left 03/10/2016   Procedure: TOTAL KNEE ARTHROPLASTY;  Surgeon: Paralee Cancel, MD;  Location: [facility];  Service: Orthopedics;  Laterality: Left;  . TRANSTHORACIC ECHOCARDIOGRAM  09/2012   EF 60-65%, Gr 1 DD, mild AI, mild bileaflet MVP, mod post directed MR, mild LAE, PASP 35.  Marland Kitchen VAGINAL HYSTERECTOMY     For Uterine Deviation     There were no vitals filed for this visit.      Subjective Assessment - 04/08/16 1031    Subjective Reports that she was tired of using FWW and now uses SPC. Patient denies any pain today and reports that calf muscle is better today.   Limitations Walking   How long can you walk comfortably? Short distances.   Patient Stated Goals Get out of pain and walk better.  Currently in Pain? No/denies            [facility] PT Assessment - 04/08/16 0001      Assessment   Medical Diagnosis Left total knee replacement.   Onset Date/Surgical Date 03/10/16   Next MD Visit 04/2016     Restrictions   Weight Bearing Restrictions No     ROM / Strength   AROM / PROM / Strength AROM     AROM   Overall AROM  Deficits   AROM Assessment Site Knee   Right/Left Knee Left   Left Knee Extension -10   Left Knee Flexion 117                     [facility] Adult PT Treatment/Exercise - 04/08/16 0001      Knee/Hip Exercises: Stretches   Active Hamstring Stretch Left;3 reps;30 seconds     Knee/Hip Exercises: Aerobic   Nustep L5, seat 10 x15 min     Knee/Hip Exercises: Standing   Hip Flexion Stengthening;Left;2 sets;10 reps;Knee bent   Hip Flexion Limitations 3#   Forward Lunges Left;2 sets;10 reps    Hip Abduction Stengthening;Left;2 sets;10 reps;Knee straight   Abduction Limitations 3#   Rocker Board 2 minutes     Knee/Hip Exercises: Seated   Long Arc Quad Strengthening;Left;2 sets;10 reps   Long Arc Quad Weight 3 lbs.     Modalities   Modalities Passenger transport manager Location L knee    Electrical Stimulation Action IFC   Electrical Stimulation Parameters 1-10 hz x15 min   Electrical Stimulation Goals Pain;Edema     Vasopneumatic   Number Minutes Vasopneumatic  15 minutes   Vasopnuematic Location  Knee   Vasopneumatic Pressure Medium   Vasopneumatic Temperature  34     Manual Therapy   Manual Therapy Passive ROM;Soft tissue mobilization   Soft tissue mobilization L patella mobilizations in lat/med, sup/inf directions to promote proper mobility, minimal STW to L superior gastroc to decrease tightness   Passive ROM PROM of R knee into flex/ext with gentle holds at end range and oscillations to promote relaxation                  PT Short Term Goals - 04/08/16 1130      PT SHORT TERM GOAL #1   Title Ind with a HEP.   Status Achieved     PT SHORT TERM GOAL #2   Title Full left knee extension.   Time 2   Period Weeks   Status On-going  AROM L knee ext -10 deg from neutral 04/08/2016           PT Long Term Goals - 04/08/16 1130      PT LONG TERM GOAL #1   Title Independent with an advanced HEP.   Time 8   Period Weeks   Status On-going     PT LONG TERM GOAL #2   Title Active left  knee flexion to 115 degrees+ so the patient can perform functional tasks and do so with pain not > 2-3/10.   Status Achieved  AROM L knee flexion measured as 117 deg 04/08/2016     PT LONG TERM GOAL #3   Title Increase left knee strength to a solid 4+/5 to provide good stability for accomplishment of functional activities   Status On-going     PT LONG TERM GOAL #4   Title Decrease edema to within 2  cms  of non-affected side to assist with pain reduction and range of motion gains.   Status On-going     PT LONG TERM GOAL #5   Title Perform a reciprocating stair gait with one railing with pain not > 2-3/10.   Status On-going               Plan - 04/08/16 1111    Clinical Impression Statement Patient continues to progress in regards to activity and ROM of L knee. Patient able to tolerate more exercises today without report of pain during or prior to treatment. Patient now utilizing [facility] for ambulation with trunk flexion and decreased L knee flexion. Patient's AROM of L knee measured as 10-117 deg in supine with good L patellar mobility noted as well. Minimal superior L gastroc tightness noted today upon palpation. Normal modalities response noted following removal of the modalities.    Rehab Potential Good   PT Frequency 3x / week   PT Duration 4 weeks   PT Treatment/Interventions Electrical Stimulation;Cryotherapy;Therapeutic activities;Therapeutic exercise;Patient/family education;Passive range of motion;Vasopneumatic Device;Manual techniques   PT Next Visit Plan Continue with exercises and ROM especially into extension with modalites per MPT POC.   Consulted and Agree with Plan of Care Patient      Patient will benefit from skilled therapeutic intervention in order to improve the following deficits and impairments:  Pain, Increased edema, Decreased strength, Decreased activity tolerance, Decreased range of motion  Visit Diagnosis: Pain in left knee  Stiffness of left knee, not elsewhere classified  Localized edema     Problem List Patient Active Problem List   Diagnosis Date Noted  . 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 Paroxysmal atrial fibrillation with rapid ventricular response ([place]) 09/28/2015  . Maxillary sinusitis, acute 08/27/2015  . Acute bronchitis 09/08/2014  . Mitral insufficiency 05/12/2014  . HTN (hypertension) 04/28/2014  .  Lactic acidosis 09/07/2013  . Mitral valve prolapse 06/14/2013  . Chest pain, exertional 09/13/2012  . Pernicious anemia 09/07/2012  . DIVERTICULOSIS, COLON 09/06/2009  . Osteoporosis 09/06/2009  . Hyperlipidemia 09/04/2009  . PERIPHERAL NEUROPATHY 09/04/2009  . LOW BACK PAIN, CHRONIC 04/08/2007    Wynelle Fanny, PTA 04/08/2016, 11:33 AM  [facility] [address] [place], [place], [address] Phone: [phone]   Fax:  [phone]  Name: Cassie White MRN: [id number] Date of Birth: 05/13/1941

## 2016-04-10 ENCOUNTER — Ambulatory Visit: Payer: Medicare Other | Admitting: Physical Therapy

## 2016-04-10 ENCOUNTER — Encounter: Payer: Self-pay | Admitting: Physical Therapy

## 2016-04-10 DIAGNOSIS — M25662 Stiffness of left knee, not elsewhere classified: Secondary | ICD-10-CM

## 2016-04-10 DIAGNOSIS — R600 Localized edema: Secondary | ICD-10-CM

## 2016-04-10 DIAGNOSIS — M25562 Pain in left knee: Secondary | ICD-10-CM

## 2016-04-10 NOTE — Therapy (Signed)
[facility] [place], [place], [address] Phone: [phone]   Fax:  [phone]  Physical Therapy Treatment  Patient Details  Name: Cassie White MRN: [id number] Date of Birth: 11/22/1940 Referring Provider: Paralee Cancel MD  Encounter Date: 04/10/2016      PT End of Session - 04/10/16 1034    Visit Number 10   Number of Visits 12   Date for PT Re-Evaluation 05/17/16   PT Start Time 1034   PT Stop Time 1125   PT Time Calculation (min) 51 min   Activity Tolerance Patient tolerated treatment well   Behavior During Therapy [facility] for tasks assessed/performed      Past Medical History:  Diagnosis Date  . Arthritis    DJD, back and both hips  . Chronic low back pain without sciatica    DDD + scoliosis.  As of 12/04/15, Dr. Nelva Bush will do a back injection and follow her up in 1 mo.  . Diverticulosis    on colonoscopy 2004  . GERD (gastroesophageal reflux disease)   . Hx of cardiovascular stress test    Lexiscan Myoview (2/14):  Low risk, small ant defect likely breast attenuation, no ischemia, EF 69% (no change from 2010).    . Hyperlipidemia   . Hypertension    per pt  . Mitral regurgitation    Echo (2/14):  EF 60-65%, Gr 1 DD, mild AI, mild bileaflet MVP, mod post directed MR, mild LAE, PASP 35.  Marland Kitchen MVP (mitral valve prolapse)   . Osteoporosis, senile    stable at the left hip 2014-2015.  Took fosamax for 10 yrs.  Plan: get repeat DEXA q77mo.  Marland Kitchen PAF (paroxysmal atrial fibrillation) (HCC)    Dr. Martinique: Flecainide, metopr, apxiaban.  . Peripheral neuropathy ([place]) 2008   Idiopathic vs familial (motor>sensory): gabapentin helpful  . PONV (postoperative nausea and vomiting)   . Scoliosis   . Shoulder pain left   RC surg 09/2014    Past Surgical History:  Procedure Laterality Date  . BREAST SURGERY  Nov 2010   Benign biopsy, right  . CHOLECYSTECTOMY  03/2010   Dr.Gross  . COLONOSCOPY  10/03/2002   No polyps.  Repeat 10  yrs (Dr. Olevia Perches)  . COMBINED HYSTERECTOMY VAGINAL / OOPHORECTOMY / A&P REPAIR  0000000   uncertain if ovaries were removed or not  . CYSTOCELE REPAIR    . Lumpectomy  06/2009   "Fatty Necrosis"  . PFT's  2014   NORMAL  . ROTATOR CUFF REPAIR Left 10/02/14   Dr. Veverly Fells  . TONSILLECTOMY    . TOTAL KNEE ARTHROPLASTY  09/2009   Right ;Dr Alvan Dame  . TOTAL KNEE ARTHROPLASTY Left 03/10/2016   Procedure: TOTAL KNEE ARTHROPLASTY;  Surgeon: Paralee Cancel, MD;  Location: [facility];  Service: Orthopedics;  Laterality: Left;  . TRANSTHORACIC ECHOCARDIOGRAM  09/2012   EF 60-65%, Gr 1 DD, mild AI, mild bileaflet MVP, mod post directed MR, mild LAE, PASP 35.  Marland Kitchen VAGINAL HYSTERECTOMY     For Uterine Deviation     There were no vitals filed for this visit.      Subjective Assessment - 04/10/16 1022    Subjective Reports that her knee feels "pretty good" today and took pain medication prior to coming to PT.   Limitations Walking   How long can you walk comfortably? Short distances.   Patient Stated Goals Get out of pain and walk better.   Currently in Pain? No/denies  [facility] PT Assessment - 04/10/16 0001      Assessment   Medical Diagnosis Left total knee replacement.   Onset Date/Surgical Date 03/10/16   Next MD Visit 04/2016     Restrictions   Weight Bearing Restrictions No                     [facility] Adult PT Treatment/Exercise - 04/10/16 0001      Knee/Hip Exercises: Aerobic   Nustep L5, seat 10-7 x12 min     Knee/Hip Exercises: Standing   Hip Flexion Stengthening;Left;2 sets;10 reps;Knee bent   Hip Flexion Limitations 3#   Forward Lunges Left;2 sets;10 reps   Hip Abduction Stengthening;Left;2 sets;10 reps;Knee straight   Abduction Limitations 3#   Forward Step Up Left;2 sets;10 reps;Hand Hold: 2;Step Height: 6"   Rocker Board 2 minutes     Knee/Hip Exercises: Seated   Long Arc Quad Strengthening;Left;2 sets;10 reps   Long Arc Quad Weight 4 lbs.     Modalities    Modalities Passenger transport manager Location L knee    Electrical Stimulation Action IFC   Electrical Stimulation Parameters 1-10 hz x15 min   Electrical Stimulation Goals Pain;Edema     Vasopneumatic   Number Minutes Vasopneumatic  15 minutes   Vasopnuematic Location  Knee   Vasopneumatic Pressure Medium   Vasopneumatic Temperature  67     Manual Therapy   Manual Therapy Passive ROM;Soft tissue mobilization   Soft tissue mobilization L incision mobilizations to promote proper mobility   Passive ROM PROM of R knee into flex/ext with gentle holds at end range and oscillations to promote relaxation                  PT Short Term Goals - 04/08/16 1130      PT SHORT TERM GOAL #1   Title Ind with a HEP.   Status Achieved     PT SHORT TERM GOAL #2   Title Full left knee extension.   Time 2   Period Weeks   Status On-going  AROM L knee ext -10 deg from neutral 04/08/2016           PT Long Term Goals - 04/08/16 1130      PT LONG TERM GOAL #1   Title Independent with an advanced HEP.   Time 8   Period Weeks   Status On-going     PT LONG TERM GOAL #2   Title Active left  knee flexion to 115 degrees+ so the patient can perform functional tasks and do so with pain not > 2-3/10.   Status Achieved  AROM L knee flexion measured as 117 deg 04/08/2016     PT LONG TERM GOAL #3   Title Increase left knee strength to a solid 4+/5 to provide good stability for accomplishment of functional activities   Status On-going     PT LONG TERM GOAL #4   Title Decrease edema to within 2 cms of non-affected side to assist with pain reduction and range of motion gains.   Status On-going     PT LONG TERM GOAL #5   Title Perform a reciprocating stair gait with one railing with pain not > 2-3/10.   Status On-going               Plan - 04/10/16 1115    Clinical Impression Statement Patient continues to progress  well following L TKR  and has arrived for the past several treatments with no report of pain. Patient advanced herself to use of [facility] for ambulation from [facility]. Patient able to tolerate L hip/knee strengthening well with ankle weights. Patient able to tolerate the initiation of stair ambulation today with UE support without complaint. Fairly good scar mobility noted today with inferior incision slightly restricted and firm end feels noted with PROM of L knee. Normal modalities response noted following removal of the modalities.   Rehab Potential Good   PT Frequency 3x / week   PT Duration 4 weeks   PT Treatment/Interventions Electrical Stimulation;Cryotherapy;Therapeutic activities;Therapeutic exercise;Patient/family education;Passive range of motion;Vasopneumatic Device;Manual techniques   PT Next Visit Plan Continue with exercises and ROM especially into extension with modalites per MPT POC.   Consulted and Agree with Plan of Care Patient      Patient will benefit from skilled therapeutic intervention in order to improve the following deficits and impairments:  Pain, Increased edema, Decreased strength, Decreased activity tolerance, Decreased range of motion  Visit Diagnosis: Pain in left knee  Stiffness of left knee, not elsewhere classified  Localized edema     Problem List Patient Active Problem List   Diagnosis Date Noted  . 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 Paroxysmal atrial fibrillation with rapid ventricular response ([place]) 09/28/2015  . Maxillary sinusitis, acute 08/27/2015  . Acute bronchitis 09/08/2014  . Mitral insufficiency 05/12/2014  . HTN (hypertension) 04/28/2014  . Lactic acidosis 09/07/2013  . Mitral valve prolapse 06/14/2013  . Chest pain, exertional 09/13/2012  . Pernicious anemia 09/07/2012  . DIVERTICULOSIS, COLON 09/06/2009  . Osteoporosis 09/06/2009  . Hyperlipidemia 09/04/2009  . PERIPHERAL NEUROPATHY 09/04/2009  . LOW BACK  PAIN, CHRONIC 04/08/2007    Ahmed Prima, PTA 04/10/16 11:28 AM  [facility] [place], [place], [address] Phone: [phone]   Fax:  [phone]  Name: KAILAH MARRON MRN: [id number] Date of Birth: 10-Jun-1941

## 2016-04-14 ENCOUNTER — Encounter: Payer: Self-pay | Admitting: Physical Therapy

## 2016-04-14 ENCOUNTER — Ambulatory Visit: Payer: Medicare Other | Admitting: Physical Therapy

## 2016-04-14 DIAGNOSIS — M25562 Pain in left knee: Secondary | ICD-10-CM | POA: Diagnosis not present

## 2016-04-14 DIAGNOSIS — M25662 Stiffness of left knee, not elsewhere classified: Secondary | ICD-10-CM

## 2016-04-14 DIAGNOSIS — R600 Localized edema: Secondary | ICD-10-CM

## 2016-04-14 NOTE — Therapy (Signed)
[facility] [place], [place], [address] Phone: [phone]   Fax:  [phone]  Physical Therapy Treatment  Patient Details  Name: Cassie White MRN: [id number] Date of Birth: May 25, 1941 Referring Provider: Paralee Cancel MD  Encounter Date: 04/14/2016      PT End of Session - 04/14/16 1438    Visit Number 11   Number of Visits 12   Date for PT Re-Evaluation 05/17/16   PT Start Time 1400   PT Stop Time 1456   PT Time Calculation (min) 56 min   Activity Tolerance Patient tolerated treatment well   Behavior During Therapy [facility] for tasks assessed/performed      Past Medical History:  Diagnosis Date  . Arthritis    DJD, back and both hips  . Chronic low back pain without sciatica    DDD + scoliosis.  As of 12/04/15, Dr. Nelva Bush will do a back injection and follow her up in 1 mo.  . Diverticulosis    on colonoscopy 2004  . GERD (gastroesophageal reflux disease)   . Hx of cardiovascular stress test    Lexiscan Myoview (2/14):  Low risk, small ant defect likely breast attenuation, no ischemia, EF 69% (no change from 2010).    . Hyperlipidemia   . Hypertension    per pt  . Mitral regurgitation    Echo (2/14):  EF 60-65%, Gr 1 DD, mild AI, mild bileaflet MVP, mod post directed MR, mild LAE, PASP 35.  Marland Kitchen MVP (mitral valve prolapse)   . Osteoporosis, senile    stable at the left hip 2014-2015.  Took fosamax for 10 yrs.  Plan: get repeat DEXA q90mo.  Marland Kitchen PAF (paroxysmal atrial fibrillation) (HCC)    Dr. Martinique: Flecainide, metopr, apxiaban.  . Peripheral neuropathy ([place]) 2008   Idiopathic vs familial (motor>sensory): gabapentin helpful  . PONV (postoperative nausea and vomiting)   . Scoliosis   . Shoulder pain left   RC surg 09/2014    Past Surgical History:  Procedure Laterality Date  . BREAST SURGERY  Nov 2010   Benign biopsy, right  . CHOLECYSTECTOMY  03/2010   Dr.Gross  . COLONOSCOPY  10/03/2002   No polyps.  Repeat  10 yrs (Dr. Olevia Perches)  . COMBINED HYSTERECTOMY VAGINAL / OOPHORECTOMY / A&P REPAIR  0000000   uncertain if ovaries were removed or not  . CYSTOCELE REPAIR    . Lumpectomy  06/2009   "Fatty Necrosis"  . PFT's  2014   NORMAL  . ROTATOR CUFF REPAIR Left 10/02/14   Dr. Veverly Fells  . TONSILLECTOMY    . TOTAL KNEE ARTHROPLASTY  09/2009   Right ;Dr Alvan Dame  . TOTAL KNEE ARTHROPLASTY Left 03/10/2016   Procedure: TOTAL KNEE ARTHROPLASTY;  Surgeon: Paralee Cancel, MD;  Location: [facility];  Service: Orthopedics;  Laterality: Left;  . TRANSTHORACIC ECHOCARDIOGRAM  09/2012   EF 60-65%, Gr 1 DD, mild AI, mild bileaflet MVP, mod post directed MR, mild LAE, PASP 35.  Marland Kitchen VAGINAL HYSTERECTOMY     For Uterine Deviation     There were no vitals filed for this visit.      Subjective Assessment - 04/14/16 1400    Subjective Patient arrived with [facility] today and feels knee is improving, has no pain upon arival   Patient is accompained by: Family member   Limitations Walking   How long can you walk comfortably? Short distances.   Patient Stated Goals Get out of pain and walk better.   Currently  in Pain? No/denies            [facility] PT Assessment - 04/14/16 0001      ROM / Strength   AROM / PROM / Strength AROM;PROM     AROM   AROM Assessment Site Knee   Right/Left Knee Left   Left Knee Extension -15     PROM   PROM Assessment Site Knee   Right/Left Knee Left   Left Knee Extension -11                     [facility] Adult PT Treatment/Exercise - 04/14/16 0001      Knee/Hip Exercises: Aerobic   Nustep 52min L5 with seat adjusted for ROM     Knee/Hip Exercises: Standing   Forward Step Up Left;10 reps;3 sets;Step Height: 6"   Rocker Board 3 minutes     Acupuncturist Location L knee    Electrical Stimulation Action IFC   Electrical Stimulation Parameters 1-10hz  x31min   Electrical Stimulation Goals Pain;Edema     Vasopneumatic   Number Minutes Vasopneumatic  15  minutes   Vasopnuematic Location  Knee   Vasopneumatic Pressure Medium     Manual Therapy   Manual Therapy Passive ROM   Passive ROM PROM for left knee ext with gentle overpressure and holds                  PT Short Term Goals - 04/14/16 1446      PT SHORT TERM GOAL #1   Title Ind with a HEP.   Time 2   Period Weeks   Status Achieved     PT SHORT TERM GOAL #2   Title Full left knee extension.   Time 2   Period Weeks   Status On-going  AROM -15 degrees 04/14/16           PT Long Term Goals - 04/08/16 1130      PT LONG TERM GOAL #1   Title Independent with an advanced HEP.   Time 8   Period Weeks   Status On-going     PT LONG TERM GOAL #2   Title Active left  knee flexion to 115 degrees+ so the patient can perform functional tasks and do so with pain not > 2-3/10.   Status Achieved  AROM L knee flexion measured as 117 deg 04/08/2016     PT LONG TERM GOAL #3   Title Increase left knee strength to a solid 4+/5 to provide good stability for accomplishment of functional activities   Status On-going     PT LONG TERM GOAL #4   Title Decrease edema to within 2 cms of non-affected side to assist with pain reduction and range of motion gains.   Status On-going     PT LONG TERM GOAL #5   Title Perform a reciprocating stair gait with one railing with pain not > 2-3/10.   Status On-going               Plan - 04/14/16 1442    Clinical Impression Statement Patient progressing with all activities today. Patient is ambulating independently with SPC with slight gait deviation due to left knee ext deficit. Patient is improving with PROM for ext today. Patient doing stretches daily and feels improvement overall. Patient had some pain with stretching into ext today. Performed ES with VASO following to decrease soreness and edema in knee. Goals progressing yet ongoing due to  strenth and ROM deficits.    Rehab Potential Good   PT Frequency 3x / week   PT Duration 4  weeks   PT Treatment/Interventions Electrical Stimulation;Cryotherapy;Therapeutic activities;Therapeutic exercise;Patient/family education;Passive range of motion;Vasopneumatic Device;Manual techniques   PT Next Visit Plan Continue with exercises and ROM into extension with modalites per MPT POC. (MD. Alvan Dame 04/30/16)   Consulted and Agree with Plan of Care Patient      Patient will benefit from skilled therapeutic intervention in order to improve the following deficits and impairments:  Pain, Increased edema, Decreased strength, Decreased activity tolerance, Decreased range of motion  Visit Diagnosis: Pain in left knee  Stiffness of left knee, not elsewhere classified  Localized edema     Problem List Patient Active Problem List   Diagnosis Date Noted  . 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 Paroxysmal atrial fibrillation with rapid ventricular response ([place]) 09/28/2015  . Maxillary sinusitis, acute 08/27/2015  . Acute bronchitis 09/08/2014  . Mitral insufficiency 05/12/2014  . HTN (hypertension) 04/28/2014  . Lactic acidosis 09/07/2013  . Mitral valve prolapse 06/14/2013  . Chest pain, exertional 09/13/2012  . Pernicious anemia 09/07/2012  . DIVERTICULOSIS, COLON 09/06/2009  . Osteoporosis 09/06/2009  . Hyperlipidemia 09/04/2009  . PERIPHERAL NEUROPATHY 09/04/2009  . LOW BACK PAIN, CHRONIC 04/08/2007    Charnell Peplinski P, PTA 04/14/2016, 3:00 PM  [facility] [place], [place], [address] Phone: [phone]   Fax:  [phone]  Name: Cassie White MRN: [id number] Date of Birth: [birth year]

## 2016-04-16 ENCOUNTER — Encounter: Payer: Self-pay | Admitting: Physical Therapy

## 2016-04-16 ENCOUNTER — Ambulatory Visit: Payer: Medicare Other | Admitting: Physical Therapy

## 2016-04-16 DIAGNOSIS — R600 Localized edema: Secondary | ICD-10-CM | POA: Diagnosis not present

## 2016-04-16 DIAGNOSIS — M25662 Stiffness of left knee, not elsewhere classified: Secondary | ICD-10-CM

## 2016-04-16 DIAGNOSIS — M25562 Pain in left knee: Secondary | ICD-10-CM | POA: Diagnosis not present

## 2016-04-16 NOTE — Addendum Note (Signed)
Addended by: Gerik Coberly, [place] W on: 04/16/2016 05:49 PM   Modules accepted: Orders

## 2016-04-16 NOTE — Therapy (Signed)
[facility] [place], [place], [address] Phone: [phone]   Fax:  [phone]  Physical Therapy Treatment  Patient Details  Name: Cassie White MRN: [id number] Date of Birth: June 24, 1941 Referring Provider: Paralee Cancel MD  Encounter Date: 04/16/2016      PT End of Session - 04/16/16 1313    Visit Number 12   Number of Visits 12   Date for PT Re-Evaluation 05/17/16   PT Start Time 1230   PT Stop Time 1328   PT Time Calculation (min) 58 min   Activity Tolerance Patient tolerated treatment well   Behavior During Therapy [facility] for tasks assessed/performed      Past Medical History:  Diagnosis Date  . Arthritis    DJD, back and both hips  . Chronic low back pain without sciatica    DDD + scoliosis.  As of 12/04/15, Dr. Nelva Bush will do a back injection and follow her up in 1 mo.  . Diverticulosis    on colonoscopy 2004  . GERD (gastroesophageal reflux disease)   . Hx of cardiovascular stress test    Lexiscan Myoview (2/14):  Low risk, small ant defect likely breast attenuation, no ischemia, EF 69% (no change from 2010).    . Hyperlipidemia   . Hypertension    per pt  . Mitral regurgitation    Echo (2/14):  EF 60-65%, Gr 1 DD, mild AI, mild bileaflet MVP, mod post directed MR, mild LAE, PASP 35.  Marland Kitchen MVP (mitral valve prolapse)   . Osteoporosis, senile    stable at the left hip 2014-2015.  Took fosamax for 10 yrs.  Plan: get repeat DEXA q33mo.  Marland Kitchen PAF (paroxysmal atrial fibrillation) (HCC)    Dr. Martinique: Flecainide, metopr, apxiaban.  . Peripheral neuropathy ([place]) 2008   Idiopathic vs familial (motor>sensory): gabapentin helpful  . PONV (postoperative nausea and vomiting)   . Scoliosis   . Shoulder pain left   RC surg 09/2014    Past Surgical History:  Procedure Laterality Date  . BREAST SURGERY  Nov 2010   Benign biopsy, right  . CHOLECYSTECTOMY  03/2010   Dr.Gross  . COLONOSCOPY  10/03/2002   No polyps.  Repeat  10 yrs (Dr. Olevia Perches)  . COMBINED HYSTERECTOMY VAGINAL / OOPHORECTOMY / A&P REPAIR  0000000   uncertain if ovaries were removed or not  . CYSTOCELE REPAIR    . Lumpectomy  06/2009   "Fatty Necrosis"  . PFT's  2014   NORMAL  . ROTATOR CUFF REPAIR Left 10/02/14   Dr. Veverly Fells  . TONSILLECTOMY    . TOTAL KNEE ARTHROPLASTY  09/2009   Right ;Dr Alvan Dame  . TOTAL KNEE ARTHROPLASTY Left 03/10/2016   Procedure: TOTAL KNEE ARTHROPLASTY;  Surgeon: Paralee Cancel, MD;  Location: [facility];  Service: Orthopedics;  Laterality: Left;  . TRANSTHORACIC ECHOCARDIOGRAM  09/2012   EF 60-65%, Gr 1 DD, mild AI, mild bileaflet MVP, mod post directed MR, mild LAE, PASP 35.  Marland Kitchen VAGINAL HYSTERECTOMY     For Uterine Deviation     There were no vitals filed for this visit.      Subjective Assessment - 04/16/16 1234    Subjective Patient arrived with reports of some swelling this AM for unknown reason   Patient is accompained by: Family member   Limitations Walking   How long can you walk comfortably? Short distances.   Patient Stated Goals Get out of pain and walk better.   Currently in Pain? Yes  Pain Score 1    Pain Location Knee   Pain Orientation Left   Pain Descriptors / Indicators Tightness   Pain Type Surgical pain   Pain Onset More than a month ago   Pain Frequency Intermittent   Aggravating Factors  prolong activity   Pain Relieving Factors at rest            [facility] PT Assessment - 04/16/16 0001      AROM   AROM Assessment Site Knee   Right/Left Knee Left   Left Knee Extension -15     PROM   PROM Assessment Site Knee   Right/Left Knee Left   Left Knee Extension -11                     [facility] Adult PT Treatment/Exercise - 04/16/16 0001      Knee/Hip Exercises: Aerobic   Nustep 25min L5 seat adjusted for ROM     Knee/Hip Exercises: Standing   Rocker Board 3 minutes     Electrical Stimulation   Electrical Stimulation Location L knee    Electrical Stimulation Action IFC    Electrical Stimulation Parameters 1-10hz  x35min   Electrical Stimulation Goals Pain;Edema     Vasopneumatic   Number Minutes Vasopneumatic  15 minutes   Vasopnuematic Location  Knee   Vasopneumatic Pressure Medium     Manual Therapy   Manual Therapy Passive ROM   Passive ROM PROM for left knee ext with gentle overpressure and holds                  PT Short Term Goals - 04/14/16 1446      PT SHORT TERM GOAL #1   Title Ind with a HEP.   Time 2   Period Weeks   Status Achieved     PT SHORT TERM GOAL #2   Title Full left knee extension.   Time 2   Period Weeks   Status On-going  AROM -15 degrees 04/14/16           PT Long Term Goals - 04/08/16 1130      PT LONG TERM GOAL #1   Title Independent with an advanced HEP.   Time 8   Period Weeks   Status On-going     PT LONG TERM GOAL #2   Title Active left  knee flexion to 115 degrees+ so the patient can perform functional tasks and do so with pain not > 2-3/10.   Status Achieved  AROM L knee flexion measured as 117 deg 04/08/2016     PT LONG TERM GOAL #3   Title Increase left knee strength to a solid 4+/5 to provide good stability for accomplishment of functional activities   Status On-going     PT LONG TERM GOAL #4   Title Decrease edema to within 2 cms of non-affected side to assist with pain reduction and range of motion gains.   Status On-going     PT LONG TERM GOAL #5   Title Perform a reciprocating stair gait with one railing with pain not > 2-3/10.   Status On-going               Plan - 04/16/16 1315    Clinical Impression Statement Patient progressing with little pain overall and tolerated treatment very well. Patient had minimal soreness with stretching today. Patient noticed some swelling this morning for unkown reason. Patient's knee ext did not improve today yet same as last measurement  this week. Educated patient on continued self stretches to improve knee ext and improve functional  independence. Patient current goals ongoing due to ROM and strength deficits.   Rehab Potential Good   PT Frequency 3x / week   PT Duration 4 weeks   PT Treatment/Interventions Electrical Stimulation;Cryotherapy;Therapeutic activities;Therapeutic exercise;Patient/family education;Passive range of motion;Vasopneumatic Device;Manual techniques   PT Next Visit Plan Continue with exercises and ROM into extension with modalites per MPT POC. (MD. Alvan Dame 04/30/16) Follow up sent to MPT today   Consulted and Agree with Plan of Care Patient      Patient will benefit from skilled therapeutic intervention in order to improve the following deficits and impairments:  Pain, Increased edema, Decreased strength, Decreased activity tolerance, Decreased range of motion  Visit Diagnosis: Pain in left knee  Stiffness of left knee, not elsewhere classified  Localized edema     Problem List Patient Active Problem List   Diagnosis Date Noted  . 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 Paroxysmal atrial fibrillation with rapid ventricular response ([place]) 09/28/2015  . Maxillary sinusitis, acute 08/27/2015  . Acute bronchitis 09/08/2014  . Mitral insufficiency 05/12/2014  . HTN (hypertension) 04/28/2014  . Lactic acidosis 09/07/2013  . Mitral valve prolapse 06/14/2013  . Chest pain, exertional 09/13/2012  . Pernicious anemia 09/07/2012  . DIVERTICULOSIS, COLON 09/06/2009  . Osteoporosis 09/06/2009  . Hyperlipidemia 09/04/2009  . PERIPHERAL NEUROPATHY 09/04/2009  . LOW BACK PAIN, CHRONIC 04/08/2007    Cinda Hara P, PTA 04/16/2016, 1:28 PM  [facility] [place], [place], [address] Phone: [phone]   Fax:  [phone]  Name: Cassie White MRN: [id number] Date of Birth: [birth year]

## 2016-04-18 ENCOUNTER — Ambulatory Visit: Payer: Medicare Other | Admitting: Physical Therapy

## 2016-04-18 DIAGNOSIS — M25662 Stiffness of left knee, not elsewhere classified: Secondary | ICD-10-CM

## 2016-04-18 DIAGNOSIS — M25562 Pain in left knee: Secondary | ICD-10-CM | POA: Diagnosis not present

## 2016-04-18 DIAGNOSIS — R600 Localized edema: Secondary | ICD-10-CM | POA: Diagnosis not present

## 2016-04-18 NOTE — Therapy (Signed)
[facility] [place], [place], [address] Phone: [phone]   Fax:  [phone]  Physical Therapy Treatment  Patient Details  Name: Cassie White MRN: [id number] Date of Birth: 12/15/1940 Referring Provider: Paralee Cancel MD  Encounter Date: 04/18/2016      PT End of Session - 04/18/16 1110    Visit Number 13   Number of Visits 18   Date for PT Re-Evaluation 05/17/16   PT Start Time 0945   PT Stop Time 1052   PT Time Calculation (min) 67 min   Activity Tolerance Patient tolerated treatment well   Behavior During Therapy [facility] for tasks assessed/performed      Past Medical History:  Diagnosis Date  . Arthritis    DJD, back and both hips  . Chronic low back pain without sciatica    DDD + scoliosis.  As of 12/04/15, Dr. Nelva Bush will do a back injection and follow her up in 1 mo.  . Diverticulosis    on colonoscopy 2004  . GERD (gastroesophageal reflux disease)   . Hx of cardiovascular stress test    Lexiscan Myoview (2/14):  Low risk, small ant defect likely breast attenuation, no ischemia, EF 69% (no change from 2010).    . Hyperlipidemia   . Hypertension    per pt  . Mitral regurgitation    Echo (2/14):  EF 60-65%, Gr 1 DD, mild AI, mild bileaflet MVP, mod post directed MR, mild LAE, PASP 35.  Marland Kitchen MVP (mitral valve prolapse)   . Osteoporosis, senile    stable at the left hip 2014-2015.  Took fosamax for 10 yrs.  Plan: get repeat DEXA q53mo.  Marland Kitchen PAF (paroxysmal atrial fibrillation) (HCC)    Dr. Martinique: Flecainide, metopr, apxiaban.  . Peripheral neuropathy ([place]) 2008   Idiopathic vs familial (motor>sensory): gabapentin helpful  . PONV (postoperative nausea and vomiting)   . Scoliosis   . Shoulder pain left   RC surg 09/2014    Past Surgical History:  Procedure Laterality Date  . BREAST SURGERY  Nov 2010   Benign biopsy, right  . CHOLECYSTECTOMY  03/2010   Dr.Gross  . COLONOSCOPY  10/03/2002   No polyps.  Repeat  10 yrs (Dr. Olevia Perches)  . COMBINED HYSTERECTOMY VAGINAL / OOPHORECTOMY / A&P REPAIR  0000000   uncertain if ovaries were removed or not  . CYSTOCELE REPAIR    . Lumpectomy  06/2009   "Fatty Necrosis"  . PFT's  2014   NORMAL  . ROTATOR CUFF REPAIR Left 10/02/14   Dr. Veverly Fells  . TONSILLECTOMY    . TOTAL KNEE ARTHROPLASTY  09/2009   Right ;Dr Alvan Dame  . TOTAL KNEE ARTHROPLASTY Left 03/10/2016   Procedure: TOTAL KNEE ARTHROPLASTY;  Surgeon: Paralee Cancel, MD;  Location: [facility];  Service: Orthopedics;  Laterality: Left;  . TRANSTHORACIC ECHOCARDIOGRAM  09/2012   EF 60-65%, Gr 1 DD, mild AI, mild bileaflet MVP, mod post directed MR, mild LAE, PASP 35.  Marland Kitchen VAGINAL HYSTERECTOMY     For Uterine Deviation     There were no vitals filed for this visit.          [facility] PT Assessment - 04/18/16 0001      AROM   Left Knee Flexion --  115 degrees active and 120 degrees passively.                     [facility] Adult PT Treatment/Exercise - 04/18/16 0001  Exercises   Exercises Knee/Hip     Knee/Hip Exercises: Stretches   Other Knee/Hip Stretches Supine 5# overpressure stretch to increase left knee extension.     Knee/Hip Exercises: Aerobic   Nustep 15 minutes at level 6.     Knee/Hip Exercises: Supine   Short Arc Quad Sets Limitations 15 minutes SAQ's facilitated with VMs (10 sec extension holds f/b 10 sec rest).     Acupuncturist Location Left knee.   Electrical Stimulation Action IFC   Electrical Stimulation Parameters 1-10 Hz x 15 minutes.   Electrical Stimulation Goals Edema;Pain     Vasopneumatic   Number Minutes Vasopneumatic  15 minutes   Vasopnuematic Location  --  Left knee.   Vasopneumatic Pressure Medium                PT Education - 04/18/16 1114    Education provided Yes   Education Details Overpressure stretch and LAQ's.   Person(s) Educated Patient   Methods Explanation;Demonstration   Comprehension Verbalized  understanding;Returned demonstration          PT Short Term Goals - 04/14/16 1446      PT SHORT TERM GOAL #1   Title Ind with a HEP.   Time 2   Period Weeks   Status Achieved     PT SHORT TERM GOAL #2   Title Full left knee extension.   Time 2   Period Weeks   Status On-going  AROM -15 degrees 04/14/16           PT Long Term Goals - 04/08/16 1130      PT LONG TERM GOAL #1   Title Independent with an advanced HEP.   Time 8   Period Weeks   Status On-going     PT LONG TERM GOAL #2   Title Active left  knee flexion to 115 degrees+ so the patient can perform functional tasks and do so with pain not > 2-3/10.   Status Achieved  AROM L knee flexion measured as 117 deg 04/08/2016     PT LONG TERM GOAL #3   Title Increase left knee strength to a solid 4+/5 to provide good stability for accomplishment of functional activities   Status On-going     PT LONG TERM GOAL #4   Title Decrease edema to within 2 cms of non-affected side to assist with pain reduction and range of motion gains.   Status On-going     PT LONG TERM GOAL #5   Title Perform a reciprocating stair gait with one railing with pain not > 2-3/10.   Status On-going             Patient will benefit from skilled therapeutic intervention in order to improve the following deficits and impairments:  Pain, Increased edema, Decreased strength, Decreased activity tolerance, Decreased range of motion  Visit Diagnosis: Pain in left knee  Stiffness of left knee, not elsewhere classified  Localized edema     Problem List Patient Active Problem List   Diagnosis Date Noted  . 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 Paroxysmal atrial fibrillation with rapid ventricular response ([place]) 09/28/2015  . Maxillary sinusitis, acute 08/27/2015  . Acute bronchitis 09/08/2014  . Mitral insufficiency 05/12/2014  . HTN (hypertension) 04/28/2014  . Lactic acidosis 09/07/2013  . Mitral valve  prolapse 06/14/2013  . Chest pain, exertional 09/13/2012  . Pernicious anemia 09/07/2012  . DIVERTICULOSIS, COLON 09/06/2009  .  Osteoporosis 09/06/2009  . Hyperlipidemia 09/04/2009  . PERIPHERAL NEUROPATHY 09/04/2009  . LOW BACK PAIN, CHRONIC 04/08/2007    [place], [place] 04/18/2016, 11:21 AM  [facility] [address] [place], [place], [address] Phone: [phone]   Fax:  [phone]  Name: Cassie White MRN: [id number] Date of Birth: 04-05-1941

## 2016-04-21 ENCOUNTER — Ambulatory Visit: Payer: Medicare Other | Admitting: Physical Therapy

## 2016-04-21 ENCOUNTER — Encounter: Payer: Self-pay | Admitting: Physical Therapy

## 2016-04-21 DIAGNOSIS — R600 Localized edema: Secondary | ICD-10-CM | POA: Diagnosis not present

## 2016-04-21 DIAGNOSIS — M25562 Pain in left knee: Secondary | ICD-10-CM | POA: Diagnosis not present

## 2016-04-21 DIAGNOSIS — M25662 Stiffness of left knee, not elsewhere classified: Secondary | ICD-10-CM | POA: Diagnosis not present

## 2016-04-21 NOTE — Therapy (Signed)
[facility] [place], [place], [address] Phone: [phone]   Fax:  [phone]  Physical Therapy Treatment  Patient Details  Name: Cassie White MRN: [id number] Date of Birth: 11-18-1940 Referring Provider: Paralee Cancel MD  Encounter Date: 04/21/2016      PT End of Session - 04/21/16 1438    Visit Number 14   Number of Visits 18   Date for PT Re-Evaluation 05/17/16   PT Start Time 1435   PT Stop Time 1525   PT Time Calculation (min) 50 min   Activity Tolerance Patient tolerated treatment well   Behavior During Therapy [facility] for tasks assessed/performed      Past Medical History:  Diagnosis Date  . Arthritis    DJD, back and both hips  . Chronic low back pain without sciatica    DDD + scoliosis.  As of 12/04/15, Dr. Nelva Bush will do a back injection and follow her up in 1 mo.  . Diverticulosis    on colonoscopy 2004  . GERD (gastroesophageal reflux disease)   . Hx of cardiovascular stress test    Lexiscan Myoview (2/14):  Low risk, small ant defect likely breast attenuation, no ischemia, EF 69% (no change from 2010).    . Hyperlipidemia   . Hypertension    per pt  . Mitral regurgitation    Echo (2/14):  EF 60-65%, Gr 1 DD, mild AI, mild bileaflet MVP, mod post directed MR, mild LAE, PASP 35.  Marland Kitchen MVP (mitral valve prolapse)   . Osteoporosis, senile    stable at the left hip 2014-2015.  Took fosamax for 10 yrs.  Plan: get repeat DEXA q75mo.  Marland Kitchen PAF (paroxysmal atrial fibrillation) (HCC)    Dr. Martinique: Flecainide, metopr, apxiaban.  . Peripheral neuropathy ([place]) 2008   Idiopathic vs familial (motor>sensory): gabapentin helpful  . PONV (postoperative nausea and vomiting)   . Scoliosis   . Shoulder pain left   RC surg 09/2014    Past Surgical History:  Procedure Laterality Date  . BREAST SURGERY  Nov 2010   Benign biopsy, right  . CHOLECYSTECTOMY  03/2010   Dr.Gross  . COLONOSCOPY  10/03/2002   No polyps.  Repeat  10 yrs (Dr. Olevia Perches)  . COMBINED HYSTERECTOMY VAGINAL / OOPHORECTOMY / A&P REPAIR  0000000   uncertain if ovaries were removed or not  . CYSTOCELE REPAIR    . Lumpectomy  06/2009   "Fatty Necrosis"  . PFT's  2014   NORMAL  . ROTATOR CUFF REPAIR Left 10/02/14   Dr. Veverly Fells  . TONSILLECTOMY    . TOTAL KNEE ARTHROPLASTY  09/2009   Right ;Dr Alvan Dame  . TOTAL KNEE ARTHROPLASTY Left 03/10/2016   Procedure: TOTAL KNEE ARTHROPLASTY;  Surgeon: Paralee Cancel, MD;  Location: [facility];  Service: Orthopedics;  Laterality: Left;  . TRANSTHORACIC ECHOCARDIOGRAM  09/2012   EF 60-65%, Gr 1 DD, mild AI, mild bileaflet MVP, mod post directed MR, mild LAE, PASP 35.  Marland Kitchen VAGINAL HYSTERECTOMY     For Uterine Deviation     There were no vitals filed for this visit.      Subjective Assessment - 04/21/16 1438    Subjective Denies any knee pain today. Denies any difficulty with anything at home and reports ambulating stairs. Patient reported during PROM into extension that she was very sore and PROM was uncomfortable which may be due to her using an old antique iron to stretch her knee this weekend as MPT directed in  HEP last session.   Limitations Walking   How long can you sit comfortably? No limitation   How long can you stand comfortably? No limitation   How long can you walk comfortably? 15 minutes   Patient Stated Goals Get out of pain and walk better.   Currently in Pain? No/denies            [facility] PT Assessment - 04/21/16 0001      Assessment   Medical Diagnosis Left total knee replacement.   Onset Date/Surgical Date 03/10/16   Next MD Visit 04/30/2016     Restrictions   Weight Bearing Restrictions No     Observation/Other Assessments-Edema    Edema Circumferential     Circumferential Edema   Circumferential - Right 36.9 cm   Circumferential - Left  38.4 cm     ROM / Strength   AROM / PROM / Strength AROM     AROM   Overall AROM  Deficits   AROM Assessment Site Knee   Right/Left Knee Left    Left Knee Extension -7   Left Knee Flexion 116                     [facility] Adult PT Treatment/Exercise - 04/21/16 0001      Knee/Hip Exercises: Stretches   Active Hamstring Stretch Left;3 reps;30 seconds     Knee/Hip Exercises: Aerobic   Nustep L5, seat 9 x12 min     Knee/Hip Exercises: Standing   Hip Abduction Stengthening;Left;2 sets;10 reps;Knee straight   Abduction Limitations 3#   Forward Step Up Left;2 sets;10 reps;Hand Hold: 2;Step Height: 6"   Rocker Board 3 minutes     Knee/Hip Exercises: Seated   Long Arc Quad Strengthening;Left;2 sets;10 reps   Long Arc Quad Weight 5 lbs.     Modalities   Modalities Passenger transport manager Location L knee   Electrical Stimulation Action IFC   Electrical Stimulation Parameters 1-10 hz x15 min   Electrical Stimulation Goals Edema;Pain     Vasopneumatic   Number Minutes Vasopneumatic  15 minutes   Vasopnuematic Location  Knee   Vasopneumatic Pressure Medium   Vasopneumatic Temperature  64     Manual Therapy   Manual Therapy Soft tissue mobilization;Passive ROM   Soft tissue mobilization L incision and patellar mobilizations to promote proper mobility; STW to L superior gastroc and HS to decrease soreness and palpate any increased tightness   Passive ROM PROM of L knee into extension with gentle holds at end range                  PT Short Term Goals - 04/21/16 1512      PT SHORT TERM GOAL #1   Title Ind with a HEP.   Time 2   Period Weeks   Status Achieved     PT SHORT TERM GOAL #2   Title Full left knee extension.   Time 2   Period Weeks   Status On-going  AROM -7 degrees 04/21/16           PT Long Term Goals - 04/21/16 1512      PT LONG TERM GOAL #1   Title Independent with an advanced HEP.   Time 8   Period Weeks   Status Achieved     PT LONG TERM GOAL #2   Title Active left  knee flexion to 115 degrees+ so the  patient  can perform functional tasks and do so with pain not > 2-3/10.   Status Achieved  AROM L knee flexion measured as 117 deg 04/08/2016     PT LONG TERM GOAL #3   Title Increase left knee strength to a solid 4+/5 to provide good stability for accomplishment of functional activities   Status On-going     PT LONG TERM GOAL #4   Title Decrease edema to within 2 cms of non-affected side to assist with pain reduction and range of motion gains.   Status Achieved  1.5 cm difference L>R 04/21/2016     PT LONG TERM GOAL #5   Title Perform a reciprocating stair gait with one railing with pain not > 2-3/10.   Status On-going               Plan - 04/21/16 1514    Clinical Impression Statement Patient tolerated today's treatment fairly well as she arrived with no pain but reported soreness with PROM into extension. Patient able to complete therapeutic exercise as directed with minimal multimodal cueing for proper technique. Patient able to advance with resistance for LAQ but fatigue compensation was noted by end of exercise. AROM of L knee improved to 7-116 deg in supine today. Patient experienced soreness and uncomfortableness in supine with PROM into extension in posterior L knee. Patient able to achieve edema goal today with 1.5 cm difference noted today. Good L patellar and incision mobilization noted today with no reports of discomfort with mobilizations. Patient educated to consider another way of stretching L knee into extension to avoid excessive L knee soreness. Patient reported feeling decreased soreness following removal of modalities today.   Rehab Potential Good   PT Frequency 3x / week   PT Duration 4 weeks   PT Treatment/Interventions Electrical Stimulation;Cryotherapy;Therapeutic activities;Therapeutic exercise;Patient/family education;Passive range of motion;Vasopneumatic Device;Manual techniques   PT Next Visit Plan Continue with exercises and ROM into extension with modalites  per MPT POC. (MD. Alvan Dame 04/30/16)    Consulted and Agree with Plan of Care Patient      Patient will benefit from skilled therapeutic intervention in order to improve the following deficits and impairments:  Pain, Increased edema, Decreased strength, Decreased activity tolerance, Decreased range of motion  Visit Diagnosis: Pain in left knee  Stiffness of left knee, not elsewhere classified  Localized edema     Problem List Patient Active Problem List   Diagnosis Date Noted  . 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 Paroxysmal atrial fibrillation with rapid ventricular response ([place]) 09/28/2015  . Maxillary sinusitis, acute 08/27/2015  . Acute bronchitis 09/08/2014  . Mitral insufficiency 05/12/2014  . HTN (hypertension) 04/28/2014  . Lactic acidosis 09/07/2013  . Mitral valve prolapse 06/14/2013  . Chest pain, exertional 09/13/2012  . Pernicious anemia 09/07/2012  . DIVERTICULOSIS, COLON 09/06/2009  . Osteoporosis 09/06/2009  . Hyperlipidemia 09/04/2009  . PERIPHERAL NEUROPATHY 09/04/2009  . LOW BACK PAIN, CHRONIC 04/08/2007    Wynelle Fanny, PTA 04/21/2016, 3:31 PM  [facility] [address] [place], [place], [address] Phone: [phone]   Fax:  [phone]  Name: Cassie White MRN: [id number] Date of Birth: 1941-05-04

## 2016-04-23 ENCOUNTER — Ambulatory Visit: Payer: Medicare Other | Admitting: Physical Therapy

## 2016-04-23 DIAGNOSIS — M25662 Stiffness of left knee, not elsewhere classified: Secondary | ICD-10-CM | POA: Diagnosis not present

## 2016-04-23 DIAGNOSIS — R600 Localized edema: Secondary | ICD-10-CM

## 2016-04-23 DIAGNOSIS — M25562 Pain in left knee: Secondary | ICD-10-CM | POA: Diagnosis not present

## 2016-04-23 NOTE — Therapy (Signed)
[facility] [place], [place], [address] Phone: [phone]   Fax:  [phone]  Physical Therapy Treatment  Patient Details  Name: Cassie White MRN: [id number] Date of Birth: 1940-09-08 Referring Provider: Paralee Cancel MD  Encounter Date: 04/23/2016      PT End of Session - 04/23/16 1120    Visit Number 15   Number of Visits 18   Date for PT Re-Evaluation 05/17/16   PT Start Time 0945   PT Stop Time [id number]   PT Time Calculation (min) 59 min      Past Medical History:  Diagnosis Date  . Arthritis    DJD, back and both hips  . Chronic low back pain without sciatica    DDD + scoliosis.  As of 12/04/15, Dr. Nelva Bush will do a back injection and follow her up in 1 mo.  . Diverticulosis    on colonoscopy 2004  . GERD (gastroesophageal reflux disease)   . Hx of cardiovascular stress test    Lexiscan Myoview (2/14):  Low risk, small ant defect likely breast attenuation, no ischemia, EF 69% (no change from 2010).    . Hyperlipidemia   . Hypertension    per pt  . Mitral regurgitation    Echo (2/14):  EF 60-65%, Gr 1 DD, mild AI, mild bileaflet MVP, mod post directed MR, mild LAE, PASP 35.  Marland Kitchen MVP (mitral valve prolapse)   . Osteoporosis, senile    stable at the left hip 2014-2015.  Took fosamax for 10 yrs.  Plan: get repeat DEXA q13mo.  Marland Kitchen PAF (paroxysmal atrial fibrillation) (HCC)    Dr. Martinique: Flecainide, metopr, apxiaban.  . Peripheral neuropathy ([place]) 2008   Idiopathic vs familial (motor>sensory): gabapentin helpful  . PONV (postoperative nausea and vomiting)   . Scoliosis   . Shoulder pain left   RC surg 09/2014    Past Surgical History:  Procedure Laterality Date  . BREAST SURGERY  Nov 2010   Benign biopsy, right  . CHOLECYSTECTOMY  03/2010   Dr.Gross  . COLONOSCOPY  10/03/2002   No polyps.  Repeat 10 yrs (Dr. Olevia Perches)  . COMBINED HYSTERECTOMY VAGINAL / OOPHORECTOMY / A&P REPAIR  0000000   uncertain if ovaries were  removed or not  . CYSTOCELE REPAIR    . Lumpectomy  06/2009   "Fatty Necrosis"  . PFT's  2014   NORMAL  . ROTATOR CUFF REPAIR Left 10/02/14   Dr. Veverly Fells  . TONSILLECTOMY    . TOTAL KNEE ARTHROPLASTY  09/2009   Right ;Dr Alvan Dame  . TOTAL KNEE ARTHROPLASTY Left 03/10/2016   Procedure: TOTAL KNEE ARTHROPLASTY;  Surgeon: Paralee Cancel, MD;  Location: [facility];  Service: Orthopedics;  Laterality: Left;  . TRANSTHORACIC ECHOCARDIOGRAM  09/2012   EF 60-65%, Gr 1 DD, mild AI, mild bileaflet MVP, mod post directed MR, mild LAE, PASP 35.  Marland Kitchen VAGINAL HYSTERECTOMY     For Uterine Deviation     There were no vitals filed for this visit.      Subjective Assessment - 04/23/16 1121    Subjective Recommend patient use 5# rice bag for overpressure stretching.   Pain Score 1    Pain Location Knee   Pain Orientation Left   Pain Descriptors / Indicators Tightness   Pain Onset More than a month ago     Treatment:  Nustep level 5 x 20 minutes f/b SAQ's x 18 minutes facilitated with VMS to patient's left quadriceps (10 sec on and  10 sec off) f/b Pre-mod e'stim and medium vasopneumatic x 10 minutes.  Excellent job today.                              PT Short Term Goals - 04/21/16 1512      PT SHORT TERM GOAL #1   Title Ind with a HEP.   Time 2   Period Weeks   Status Achieved     PT SHORT TERM GOAL #2   Title Full left knee extension.   Time 2   Period Weeks   Status On-going  AROM -7 degrees 04/21/16           PT Long Term Goals - 04/21/16 1512      PT LONG TERM GOAL #1   Title Independent with an advanced HEP.   Time 8   Period Weeks   Status Achieved     PT LONG TERM GOAL #2   Title Active left  knee flexion to 115 degrees+ so the patient can perform functional tasks and do so with pain not > 2-3/10.   Status Achieved  AROM L knee flexion measured as 117 deg 04/08/2016     PT LONG TERM GOAL #3   Title Increase left knee strength to a solid 4+/5 to provide  good stability for accomplishment of functional activities   Status On-going     PT LONG TERM GOAL #4   Title Decrease edema to within 2 cms of non-affected side to assist with pain reduction and range of motion gains.   Status Achieved  1.5 cm difference L>R 04/21/2016     PT LONG TERM GOAL #5   Title Perform a reciprocating stair gait with one railing with pain not > 2-3/10.   Status On-going             Patient will benefit from skilled therapeutic intervention in order to improve the following deficits and impairments:  Pain, Increased edema, Decreased strength, Decreased activity tolerance, Decreased range of motion  Visit Diagnosis: Pain in left knee  Stiffness of left knee, not elsewhere classified  Localized edema     Problem List Patient Active Problem List   Diagnosis Date Noted  . 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 Paroxysmal atrial fibrillation with rapid ventricular response ([place]) 09/28/2015  . Maxillary sinusitis, acute 08/27/2015  . Acute bronchitis 09/08/2014  . Mitral insufficiency 05/12/2014  . HTN (hypertension) 04/28/2014  . Lactic acidosis 09/07/2013  . Mitral valve prolapse 06/14/2013  . Chest pain, exertional 09/13/2012  . Pernicious anemia 09/07/2012  . DIVERTICULOSIS, COLON 09/06/2009  . Osteoporosis 09/06/2009  . Hyperlipidemia 09/04/2009  . PERIPHERAL NEUROPATHY 09/04/2009  . LOW BACK PAIN, CHRONIC 04/08/2007    Annita Ratliff, [place] MPT 04/23/2016, 12:22 PM  [facility] [address] [place], [place], [address] Phone: [phone]   Fax:  [phone]  Name: Cassie White MRN: [id number] Date of Birth: [birth year]

## 2016-04-25 ENCOUNTER — Ambulatory Visit: Payer: Medicare Other | Admitting: Physical Therapy

## 2016-04-25 ENCOUNTER — Encounter: Payer: Self-pay | Admitting: Physical Therapy

## 2016-04-25 DIAGNOSIS — M25662 Stiffness of left knee, not elsewhere classified: Secondary | ICD-10-CM

## 2016-04-25 DIAGNOSIS — R600 Localized edema: Secondary | ICD-10-CM

## 2016-04-25 DIAGNOSIS — M25562 Pain in left knee: Secondary | ICD-10-CM | POA: Diagnosis not present

## 2016-04-25 NOTE — Therapy (Signed)
[facility] [place], [place], [address] Phone: [phone]   Fax:  [phone]  Physical Therapy Treatment  Patient Details  Name: Cassie White MRN: [id number] Date of Birth: 1941/02/10 Referring Provider: Paralee Cancel MD  Encounter Date: 04/25/2016      PT End of Session - 04/25/16 1035    Visit Number 16   Number of Visits 18   Date for PT Re-Evaluation 05/17/16   PT Start Time 1031   PT Stop Time 1120   PT Time Calculation (min) 49 min   Activity Tolerance Patient tolerated treatment well   Behavior During Therapy [facility] for tasks assessed/performed      Past Medical History:  Diagnosis Date  . Arthritis    DJD, back and both hips  . Chronic low back pain without sciatica    DDD + scoliosis.  As of 12/04/15, Dr. Nelva Bush will do a back injection and follow her up in 1 mo.  . Diverticulosis    on colonoscopy 2004  . GERD (gastroesophageal reflux disease)   . Hx of cardiovascular stress test    Lexiscan Myoview (2/14):  Low risk, small ant defect likely breast attenuation, no ischemia, EF 69% (no change from 2010).    . Hyperlipidemia   . Hypertension    per pt  . Mitral regurgitation    Echo (2/14):  EF 60-65%, Gr 1 DD, mild AI, mild bileaflet MVP, mod post directed MR, mild LAE, PASP 35.  Marland Kitchen MVP (mitral valve prolapse)   . Osteoporosis, senile    stable at the left hip 2014-2015.  Took fosamax for 10 yrs.  Plan: get repeat DEXA q26mo.  Marland Kitchen PAF (paroxysmal atrial fibrillation) (HCC)    Dr. Martinique: Flecainide, metopr, apxiaban.  . Peripheral neuropathy ([place]) 2008   Idiopathic vs familial (motor>sensory): gabapentin helpful  . PONV (postoperative nausea and vomiting)   . Scoliosis   . Shoulder pain left   RC surg 09/2014    Past Surgical History:  Procedure Laterality Date  . BREAST SURGERY  Nov 2010   Benign biopsy, right  . CHOLECYSTECTOMY  03/2010   Dr.Gross  . COLONOSCOPY  10/03/2002   No polyps.  Repeat  10 yrs (Dr. Olevia Perches)  . COMBINED HYSTERECTOMY VAGINAL / OOPHORECTOMY / A&P REPAIR  0000000   uncertain if ovaries were removed or not  . CYSTOCELE REPAIR    . Lumpectomy  06/2009   "Fatty Necrosis"  . PFT's  2014   NORMAL  . ROTATOR CUFF REPAIR Left 10/02/14   Dr. Veverly Fells  . TONSILLECTOMY    . TOTAL KNEE ARTHROPLASTY  09/2009   Right ;Dr Alvan Dame  . TOTAL KNEE ARTHROPLASTY Left 03/10/2016   Procedure: TOTAL KNEE ARTHROPLASTY;  Surgeon: Paralee Cancel, MD;  Location: [facility];  Service: Orthopedics;  Laterality: Left;  . TRANSTHORACIC ECHOCARDIOGRAM  09/2012   EF 60-65%, Gr 1 DD, mild AI, mild bileaflet MVP, mod post directed MR, mild LAE, PASP 35.  Marland Kitchen VAGINAL HYSTERECTOMY     For Uterine Deviation     There were no vitals filed for this visit.      Subjective Assessment - 04/25/16 1034    Subjective Reports that she has noticed her L knee was swollen today so she iced her knee prior to coming to therapy.    Limitations Walking   How long can you sit comfortably? No limitation   How long can you stand comfortably? No limitation   How long can you  walk comfortably? 15 minutes   Currently in Pain? No/denies            [facility] PT Assessment - 04/25/16 0001      Assessment   Medical Diagnosis Left total knee replacement.   Onset Date/Surgical Date 03/10/16   Next MD Visit 04/30/2016     Restrictions   Weight Bearing Restrictions No                     [facility] Adult PT Treatment/Exercise - 04/25/16 0001      Ambulation/Gait   Stairs Yes   Stairs Assistance 6: Modified independent (Device/Increase time)   Stair Management Technique One rail Right;Alternating pattern;Forwards   Number of Stairs 4   Height of Stairs 6     Knee/Hip Exercises: Aerobic   Nustep L6, seat 9 x15 min     Knee/Hip Exercises: Machines for Strengthening   Cybex Knee Extension 10# 3x10 reps   Cybex Knee Flexion 20# 3x10 reps     Knee/Hip Exercises: Standing   Rocker Board 3 minutes      Modalities   Modalities Electrical Stimulation;Vasopneumatic     Electrical Stimulation   Electrical Stimulation Location L knee   Electrical Stimulation Action IFC   Electrical Stimulation Parameters 1-10 hz x15 min   Electrical Stimulation Goals Edema;Pain     Vasopneumatic   Number Minutes Vasopneumatic  15 minutes   Vasopnuematic Location  Knee   Vasopneumatic Pressure Medium   Vasopneumatic Temperature  34                  PT Short Term Goals - 04/21/16 1512      PT SHORT TERM GOAL #1   Title Ind with a HEP.   Time 2   Period Weeks   Status Achieved     PT SHORT TERM GOAL #2   Title Full left knee extension.   Time 2   Period Weeks   Status On-going  AROM -7 degrees 04/21/16           PT Long Term Goals - 04/21/16 1512      PT LONG TERM GOAL #1   Title Independent with an advanced HEP.   Time 8   Period Weeks   Status Achieved     PT LONG TERM GOAL #2   Title Active left  knee flexion to 115 degrees+ so the patient can perform functional tasks and do so with pain not > 2-3/10.   Status Achieved  AROM L knee flexion measured as 117 deg 04/08/2016     PT LONG TERM GOAL #3   Title Increase left knee strength to a solid 4+/5 to provide good stability for accomplishment of functional activities   Status On-going     PT LONG TERM GOAL #4   Title Decrease edema to within 2 cms of non-affected side to assist with pain reduction and range of motion gains.   Status Achieved  1.5 cm difference L>R 04/21/2016     PT LONG TERM GOAL #5   Title Perform a reciprocating stair gait with one railing with pain not > 2-3/10.   Status On-going               Plan - 04/25/16 1044    Clinical Impression Statement Patient tolerated today's treatment fairly well as she arrived with only reports of swelling but no pain. Patient had no complaints with exercises today even with the introduction to machine knee strengthening.  Able to ambulate stairs reciprically  although increased time required but weakness with LLE noted although pain was denied by patient. PROM of L knee into extension attempted but had to be stopped secondary to discomfort reported by patient. Normal modalites response noted following removal of the modalities.   Rehab Potential Good   PT Frequency 3x / week   PT Duration 4 weeks   PT Treatment/Interventions Electrical Stimulation;Cryotherapy;Therapeutic activities;Therapeutic exercise;Patient/family education;Passive range of motion;Vasopneumatic Device;Manual techniques   PT Next Visit Plan Continue with exercises and ROM into extension with modalites per MPT POC. (MD. Alvan Dame 04/30/16)    Consulted and Agree with Plan of Care Patient      Patient will benefit from skilled therapeutic intervention in order to improve the following deficits and impairments:  Pain, Increased edema, Decreased strength, Decreased activity tolerance, Decreased range of motion  Visit Diagnosis: Pain in left knee  Stiffness of left knee, not elsewhere classified  Localized edema     Problem List Patient Active Problem List   Diagnosis Date Noted  . 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 Paroxysmal atrial fibrillation with rapid ventricular response ([place]) 09/28/2015  . Maxillary sinusitis, acute 08/27/2015  . Acute bronchitis 09/08/2014  . Mitral insufficiency 05/12/2014  . HTN (hypertension) 04/28/2014  . Lactic acidosis 09/07/2013  . Mitral valve prolapse 06/14/2013  . Chest pain, exertional 09/13/2012  . Pernicious anemia 09/07/2012  . DIVERTICULOSIS, COLON 09/06/2009  . Osteoporosis 09/06/2009  . Hyperlipidemia 09/04/2009  . PERIPHERAL NEUROPATHY 09/04/2009  . LOW BACK PAIN, CHRONIC 04/08/2007   Ahmed Prima, PTA 04/25/16 12:01 PM  [facility] [place], [place], [address] Phone: [phone]   Fax:  [phone]  Name: TONIETTE STIGER MRN:  [id number] Date of Birth: [birth year]

## 2016-04-28 ENCOUNTER — Encounter: Payer: Self-pay | Admitting: Physical Therapy

## 2016-04-28 ENCOUNTER — Ambulatory Visit: Payer: Medicare Other | Admitting: Physical Therapy

## 2016-04-28 ENCOUNTER — Ambulatory Visit (INDEPENDENT_AMBULATORY_CARE_PROVIDER_SITE_OTHER): Payer: Medicare Other

## 2016-04-28 DIAGNOSIS — M25662 Stiffness of left knee, not elsewhere classified: Secondary | ICD-10-CM | POA: Diagnosis not present

## 2016-04-28 DIAGNOSIS — Z23 Encounter for immunization: Secondary | ICD-10-CM

## 2016-04-28 DIAGNOSIS — R600 Localized edema: Secondary | ICD-10-CM

## 2016-04-28 DIAGNOSIS — M25562 Pain in left knee: Secondary | ICD-10-CM | POA: Diagnosis not present

## 2016-04-28 NOTE — Therapy (Addendum)
[facility] [place], [place], [address] Phone: [phone]   Fax:  [phone]  Physical Therapy Treatment  Patient Details  Name: Cassie White MRN: [id number] Date of Birth: September 23, 1940 Referring Provider: Paralee Cancel MD  Encounter Date: 04/28/2016      PT End of Session - 04/28/16 1319    Visit Number 17   Number of Visits 18   Date for PT Re-Evaluation 05/17/16   PT Start Time 1230   PT Stop Time 1326   PT Time Calculation (min) 56 min   Activity Tolerance Patient tolerated treatment well   Behavior During Therapy [facility] for tasks assessed/performed      Past Medical History:  Diagnosis Date  . Arthritis    DJD, back and both hips  . Chronic low back pain without sciatica    DDD + scoliosis.  As of 12/04/15, Dr. Nelva Bush will do a back injection and follow her up in 1 mo.  . Diverticulosis    on colonoscopy 2004  . GERD (gastroesophageal reflux disease)   . Hx of cardiovascular stress test    Lexiscan Myoview (2/14):  Low risk, small ant defect likely breast attenuation, no ischemia, EF 69% (no change from 2010).    . Hyperlipidemia   . Hypertension    per pt  . Mitral regurgitation    Echo (2/14):  EF 60-65%, Gr 1 DD, mild AI, mild bileaflet MVP, mod post directed MR, mild LAE, PASP 35.  Marland Kitchen MVP (mitral valve prolapse)   . Osteoporosis, senile    stable at the left hip 2014-2015.  Took fosamax for 10 yrs.  Plan: get repeat DEXA q70mo  .Marland KitchenPAF (paroxysmal atrial fibrillation) (HCC)    Dr. JMartinique Flecainide, metopr, apxiaban.  . Peripheral neuropathy ([place] 2008   Idiopathic vs familial (motor>sensory): gabapentin helpful  . PONV (postoperative nausea and vomiting)   . Scoliosis   . Shoulder pain left   RC surg 09/2014    Past Surgical History:  Procedure Laterality Date  . BREAST SURGERY  Nov 2010   Benign biopsy, right  . CHOLECYSTECTOMY  03/2010   Dr.Gross  . COLONOSCOPY  10/03/2002   No polyps.  Repeat  10 yrs (Dr. BOlevia Perches  . COMBINED HYSTERECTOMY VAGINAL / OOPHORECTOMY / A&P REPAIR  14174  uncertain if ovaries were removed or not  . CYSTOCELE REPAIR    . Lumpectomy  06/2009   "Fatty Necrosis"  . PFT's  2014   NORMAL  . ROTATOR CUFF REPAIR Left 10/02/14   Dr. NVeverly Fells . TONSILLECTOMY    . TOTAL KNEE ARTHROPLASTY  09/2009   Right ;Dr OAlvan Dame . TOTAL KNEE ARTHROPLASTY Left 03/10/2016   Procedure: TOTAL KNEE ARTHROPLASTY;  Surgeon: MParalee Cancel MD;  Location: [facility];  Service: Orthopedics;  Laterality: Left;  . TRANSTHORACIC ECHOCARDIOGRAM  09/2012   EF 60-65%, Gr 1 DD, mild AI, mild bileaflet MVP, mod post directed MR, mild LAE, PASP 35.  .Marland KitchenVAGINAL HYSTERECTOMY     For Uterine Deviation     There were no vitals filed for this visit.      Subjective Assessment - 04/28/16 1236    Subjective Patient reported no pain upon arrival today and knee has been feeling a lot better.   Patient is accompained by: Family member   Limitations Walking   How long can you sit comfortably? No limitation   How long can you stand comfortably? No limitation   How long can  you walk comfortably? 15 minutes   Patient Stated Goals Get out of pain and walk better.   Currently in Pain? No/denies            [facility] PT Assessment - 04/28/16 0001      AROM   AROM Assessment Site Knee   Right/Left Knee Left   Left Knee Extension -13     PROM   PROM Assessment Site Knee   Right/Left Knee Left   Left Knee Extension -7                     [facility] Adult PT Treatment/Exercise - 04/28/16 0001      Knee/Hip Exercises: Aerobic   Nustep L6, seat 9 x15 min UE/LE activity     Knee/Hip Exercises: Machines for Strengthening   Cybex Knee Extension 10# 3x10 reps   Cybex Knee Flexion 30# 3x10 reps     Knee/Hip Exercises: Standing   Rocker Board 3 minutes     Electrical Stimulation   Electrical Stimulation Location L knee   Electrical Stimulation Action IFC   Electrical Stimulation Parameters  1-'10hz'  x77mn   Electrical Stimulation Goals Edema;Pain     Vasopneumatic   Number Minutes Vasopneumatic  15 minutes   Vasopnuematic Location  Knee   Vasopneumatic Pressure Medium     Manual Therapy   Manual Therapy Passive ROM   Passive ROM PROM of L knee into extension with gentle holds at end range                  PT Short Term Goals - 04/28/16 1333      PT SHORT TERM GOAL #1   Title Ind with a HEP.   Time 2   Status Achieved     PT SHORT TERM GOAL #2   Title Full left knee extension.   Time 2   Period Weeks   Status On-going  AROM -13 degrees 04/28/16           PT Long Term Goals - 04/28/16 1252      PT LONG TERM GOAL #1   Title Independent with an advanced HEP.   Time 8   Period Weeks   Status Achieved     PT LONG TERM GOAL #2   Title Active left  knee flexion to 115 degrees+ so the patient can perform functional tasks and do so with pain not > 2-3/10.   Time 8   Period Weeks   Status Achieved     PT LONG TERM GOAL #3   Title Increase left knee strength to a solid 4+/5 to provide good stability for accomplishment of functional activities   Time 8   Period Weeks   Status Achieved  Quad 5/5, HS 4+/5, hip abd 5/5 04/28/16     PT LONG TERM GOAL #4   Title Decrease edema to within 2 cms of non-affected side to assist with pain reduction and range of motion gains.   Time 8   Period Weeks   Status Achieved     PT LONG TERM GOAL #5   Title Perform a reciprocating stair gait with one railing with pain not > 2-3/10.   Time 8   Period Weeks   Status On-going  able to perform non-reciprocating at this time 04/28/16               Plan - 04/28/16 1321    Clinical Impression Statement Patient continues to progress overall with 0 pain  pre or post treatment and only pain was with PROM stretching to patient's left knee ext up to 2/10 and requested ES with VASO after. Patient has improved with overall left knee and hip strength. Patient has met  all but two remaiing goals due to lack of ext in left knee and dificulty with assending/decending stairs with a reciprocating star gait.   Rehab Potential Good   PT Frequency 3x / week   PT Duration 4 weeks   PT Treatment/Interventions Electrical Stimulation;Cryotherapy;Therapeutic activities;Therapeutic exercise;Patient/family education;Passive range of motion;Vasopneumatic Device;Manual techniques   PT Next Visit Plan Cont per MD Alvan Dame appt 04/30/16   Consulted and Agree with Plan of Care Patient      Patient will benefit from skilled therapeutic intervention in order to improve the following deficits and impairments:  Pain, Increased edema, Decreased strength, Decreased activity tolerance, Decreased range of motion  Visit Diagnosis: Pain in left knee  Stiffness of left knee, not elsewhere classified  Localized edema     Problem List Patient Active Problem List   Diagnosis Date Noted  . 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 Paroxysmal atrial fibrillation with rapid ventricular response ([place]) 09/28/2015  . Maxillary sinusitis, acute 08/27/2015  . Acute bronchitis 09/08/2014  . Mitral insufficiency 05/12/2014  . HTN (hypertension) 04/28/2014  . Lactic acidosis 09/07/2013  . Mitral valve prolapse 06/14/2013  . Chest pain, exertional 09/13/2012  . Pernicious anemia 09/07/2012  . DIVERTICULOSIS, COLON 09/06/2009  . Osteoporosis 09/06/2009  . Hyperlipidemia 09/04/2009  . PERIPHERAL NEUROPATHY 09/04/2009  . LOW BACK PAIN, CHRONIC 04/08/2007   PHYSICAL THERAPY DISCHARGE SUMMARY  Visits from Start of Care: 17.  Current functional level related to goals / functional outcomes: Please see above.   Remaining deficits: Loss of left knee extension and a non-reciprocating stair gait.   Education / Equipment: HEP. Plan: Patient agrees to discharge.  Patient goals were partially met. Patient is being discharged due to the physician's request.  ?????       APPLE[place]ATE, [place], MPT 04/28/2016, 2:01 PM   Mali Applegate MPT  [facility] [place], [place], [address] Phone: [phone]   Fax:  [phone]  Name: Cassie White MRN: [id number] Date of Birth: 07/13/1941

## 2016-04-30 DIAGNOSIS — Z96652 Presence of left artificial knee joint: Secondary | ICD-10-CM | POA: Diagnosis not present

## 2016-04-30 DIAGNOSIS — M1712 Unilateral primary osteoarthritis, left knee: Secondary | ICD-10-CM | POA: Diagnosis not present

## 2016-04-30 DIAGNOSIS — Z471 Aftercare following joint replacement surgery: Secondary | ICD-10-CM | POA: Diagnosis not present

## 2016-05-02 ENCOUNTER — Encounter: Payer: Medicare Other | Admitting: Physical Therapy

## 2016-05-14 ENCOUNTER — Ambulatory Visit: Payer: Medicare Other | Attending: Orthopedic Surgery | Admitting: Physical Therapy

## 2016-05-14 ENCOUNTER — Encounter: Payer: Self-pay | Admitting: Physical Therapy

## 2016-05-14 DIAGNOSIS — M25662 Stiffness of left knee, not elsewhere classified: Secondary | ICD-10-CM | POA: Insufficient documentation

## 2016-05-14 DIAGNOSIS — M25562 Pain in left knee: Secondary | ICD-10-CM | POA: Diagnosis not present

## 2016-05-14 DIAGNOSIS — R600 Localized edema: Secondary | ICD-10-CM | POA: Diagnosis not present

## 2016-05-14 NOTE — Therapy (Signed)
[facility] [place], [place], [address] Phone: [phone]   Fax:  [phone]  Physical Therapy Treatment  Patient Details  Name: Cassie White MRN: [id number] Date of Birth: 1940-09-25 Referring Provider: Paralee Cancel MD  Encounter Date: 05/14/2016      PT End of Session - 05/14/16 1131    Visit Number 18   Number of Visits 18   Date for PT Re-Evaluation 05/17/16   PT Start Time 1116   PT Stop Time 1156   PT Time Calculation (min) 40 min   Activity Tolerance Patient tolerated treatment well   Behavior During Therapy [facility] for tasks assessed/performed      Past Medical History:  Diagnosis Date  . Arthritis    DJD, back and both hips  . Chronic low back pain without sciatica    DDD + scoliosis.  As of 12/04/15, Dr. Nelva Bush will do a back injection and follow her up in 1 mo.  . Diverticulosis    on colonoscopy 2004  . GERD (gastroesophageal reflux disease)   . Hx of cardiovascular stress test    Lexiscan Myoview (2/14):  Low risk, small ant defect likely breast attenuation, no ischemia, EF 69% (no change from 2010).    . Hyperlipidemia   . Hypertension    per pt  . Mitral regurgitation    Echo (2/14):  EF 60-65%, Gr 1 DD, mild AI, mild bileaflet MVP, mod post directed MR, mild LAE, PASP 35.  Marland Kitchen MVP (mitral valve prolapse)   . Osteoporosis, senile    stable at the left hip 2014-2015.  Took fosamax for 10 yrs.  Plan: get repeat DEXA q68mo.  Marland Kitchen PAF (paroxysmal atrial fibrillation) (HCC)    Dr. Martinique: Flecainide, metopr, apxiaban.  . Peripheral neuropathy ([place]) 2008   Idiopathic vs familial (motor>sensory): gabapentin helpful  . PONV (postoperative nausea and vomiting)   . Scoliosis   . Shoulder pain left   RC surg 09/2014    Past Surgical History:  Procedure Laterality Date  . BREAST SURGERY  Nov 2010   Benign biopsy, right  . CHOLECYSTECTOMY  03/2010   Dr.Gross  . COLONOSCOPY  10/03/2002   No polyps.  Repeat  10 yrs (Dr. Olevia Perches)  . COMBINED HYSTERECTOMY VAGINAL / OOPHORECTOMY / A&P REPAIR  0000000   uncertain if ovaries were removed or not  . CYSTOCELE REPAIR    . Lumpectomy  06/2009   "Fatty Necrosis"  . PFT's  2014   NORMAL  . ROTATOR CUFF REPAIR Left 10/02/14   Dr. Veverly Fells  . TONSILLECTOMY    . TOTAL KNEE ARTHROPLASTY  09/2009   Right ;Dr Alvan Dame  . TOTAL KNEE ARTHROPLASTY Left 03/10/2016   Procedure: TOTAL KNEE ARTHROPLASTY;  Surgeon: Paralee Cancel, MD;  [facility]: [facility];  Service: Orthopedics;  Laterality: Left;  . TRANSTHORACIC ECHOCARDIOGRAM  09/2012   EF 60-65%, Gr 1 DD, mild AI, mild bileaflet MVP, mod post directed MR, mild LAE, PASP 35.  Marland Kitchen VAGINAL HYSTERECTOMY     For Uterine Deviation     There were no vitals filed for this visit.      Subjective Assessment - 05/14/16 1124    Subjective Patient reported no pain upon arrival today and knee has been feeling a lot better yet would like it to be stronger and straight   Patient is accompained by: Family member   Limitations Walking   How long can you sit comfortably? No limitation   How long can you  stand comfortably? No limitation   How long can you walk comfortably? 15 minutes   Patient Stated Goals Get out of pain and walk better.   Currently in Pain? No/denies            [facility] PT Assessment - 05/14/16 0001      AROM   AROM Assessment Site Knee   Right/Left Knee Left   Left Knee Extension -16     PROM   PROM Assessment Site Knee   Right/Left Knee Left   Left Knee Extension -9                     [facility] Adult PT Treatment/Exercise - 05/14/16 0001      Knee/Hip Exercises: Aerobic   Nustep L6, seat 9 x15 min UE/LE activity     Knee/Hip Exercises: Machines for Strengthening   Cybex Knee Extension 10# 3x10 reps   Cybex Knee Flexion 30# 3x10 reps     Manual Therapy   Manual Therapy Passive ROM   Passive ROM PROM of L knee into extension with gentle holds at end range                  PT  Short Term Goals - 05/14/16 1151      PT SHORT TERM GOAL #1   Title Ind with a HEP.   Time 2   Period Weeks   Status Achieved     PT SHORT TERM GOAL #2   Title Full left knee extension.   Time 2   Period Weeks   Status On-going  AROM -16 degrees 05/14/16           PT Long Term Goals - 04/28/16 1252      PT LONG TERM GOAL #1   Title Independent with an advanced HEP.   Time 8   Period Weeks   Status Achieved     PT LONG TERM GOAL #2   Title Active left  knee flexion to 115 degrees+ so the patient can perform functional tasks and do so with pain not > 2-3/10.   Time 8   Period Weeks   Status Achieved     PT LONG TERM GOAL #3   Title Increase left knee strength to a solid 4+/5 to provide good stability for accomplishment of functional activities   Time 8   Period Weeks   Status Achieved  Quad 5/5, HS 4+/5, hip abd 5/5 04/28/16     PT LONG TERM GOAL #4   Title Decrease edema to within 2 cms of non-affected side to assist with pain reduction and range of motion gains.   Time 8   Period Weeks   Status Achieved     PT LONG TERM GOAL #5   Title Perform a reciprocating stair gait with one railing with pain not > 2-3/10.   Time 8   Period Weeks   Status On-going  able to perform non-reciprocating at this time 04/28/16               Plan - 05/14/16 1152    Clinical Impression Statement Patient progressing slowy toward goals today due to slight loss in ROM for left knee ext. Patient reported not performing her self stretches like instructed by MD. Patient had good report from MD and was instructed to perform ext stretches up to six times daily. Patient tolerated treatment well today with no pain complaints. Patient remaining goals ongoing due to ROM deficits.  Rehab Potential Good   PT Frequency 3x / week   PT Duration 4 weeks   PT Treatment/Interventions Electrical Stimulation;Cryotherapy;Therapeutic activities;Therapeutic exercise;Patient/family  education;Passive range of motion;Vasopneumatic Device;Manual techniques   PT Next Visit Plan cont per MPT, follow up sent for re-cert   Consulted and Agree with Plan of Care Patient      Patient will benefit from skilled therapeutic intervention in order to improve the following deficits and impairments:  Pain, Increased edema, Decreased strength, Decreased activity tolerance, Decreased range of motion  Visit Diagnosis: Stiffness of left knee, not elsewhere classified  Localized edema  Left knee pain, unspecified chronicity     Problem List Patient Active Problem List   Diagnosis Date Noted  . 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 Paroxysmal atrial fibrillation with rapid ventricular response ([place]) 09/28/2015  . Maxillary sinusitis, acute 08/27/2015  . Acute bronchitis 09/08/2014  . Mitral insufficiency 05/12/2014  . HTN (hypertension) 04/28/2014  . Lactic acidosis 09/07/2013  . Mitral valve prolapse 06/14/2013  . Chest pain, exertional 09/13/2012  . Pernicious anemia 09/07/2012  . DIVERTICULOSIS, COLON 09/06/2009  . Osteoporosis 09/06/2009  . Hyperlipidemia 09/04/2009  . PERIPHERAL NEUROPATHY 09/04/2009  . LOW BACK PAIN, CHRONIC 04/08/2007    Deroy Noah P, PTA 05/14/2016, 11:57 AM  Ladean Raya, PTA 05/14/16 11:57 AM [facility] [place], [place], [address] Phone: [phone]   Fax:  [phone]  Name: Cassie White MRN: [id number] Date of Birth: 03-11-1941

## 2016-05-15 ENCOUNTER — Other Ambulatory Visit: Payer: Self-pay | Admitting: Cardiology

## 2016-05-15 ENCOUNTER — Other Ambulatory Visit: Payer: Self-pay

## 2016-05-15 DIAGNOSIS — I4891 Unspecified atrial fibrillation: Secondary | ICD-10-CM

## 2016-05-15 MED ORDER — METOPROLOL TARTRATE 25 MG PO TABS: 12.5000 mg | ORAL_TABLET | Freq: Two times a day (BID) | ORAL | 3 refills | Status: DC

## 2016-05-16 ENCOUNTER — Ambulatory Visit: Payer: Medicare Other | Admitting: Physical Therapy

## 2016-05-16 ENCOUNTER — Encounter: Payer: Self-pay | Admitting: Physical Therapy

## 2016-05-16 DIAGNOSIS — R600 Localized edema: Secondary | ICD-10-CM | POA: Diagnosis not present

## 2016-05-16 DIAGNOSIS — M25562 Pain in left knee: Secondary | ICD-10-CM

## 2016-05-16 DIAGNOSIS — M25662 Stiffness of left knee, not elsewhere classified: Secondary | ICD-10-CM | POA: Diagnosis not present

## 2016-05-16 NOTE — Therapy (Signed)
[facility] [place], [place], [address] Phone: [phone]   Fax:  [phone]  Physical Therapy Treatment  Patient Details  Name: Cassie White MRN: [id number] Date of Birth: [birth year] Referring Provider: Paralee Cancel MD  Encounter Date: 05/16/2016      PT End of Session - 05/16/16 0738    Visit Number 19   Number of Visits 18   Date for PT Re-Evaluation 05/17/16   PT Start Time 0732   PT Stop Time 0815   PT Time Calculation (min) 43 min   Activity Tolerance Patient tolerated treatment well   Behavior During Therapy [facility] for tasks assessed/performed      Past Medical History:  Diagnosis Date  . Arthritis    DJD, back and both hips  . Chronic low back pain without sciatica    DDD + scoliosis.  As of 12/04/15, Dr. Nelva Bush will do a back injection and follow her up in 1 mo.  . Diverticulosis    on colonoscopy 2004  . GERD (gastroesophageal reflux disease)   . Hx of cardiovascular stress test    Lexiscan Myoview (2/14):  Low risk, small ant defect likely breast attenuation, no ischemia, EF 69% (no change from 2010).    . Hyperlipidemia   . Hypertension    per pt  . Mitral regurgitation    Echo (2/14):  EF 60-65%, Gr 1 DD, mild AI, mild bileaflet MVP, mod post directed MR, mild LAE, PASP 35.  Marland Kitchen MVP (mitral valve prolapse)   . Osteoporosis, senile    stable at the left hip 2014-2015.  Took fosamax for 10 yrs.  Plan: get repeat DEXA q29mo.  Marland Kitchen PAF (paroxysmal atrial fibrillation) (HCC)    Dr. Martinique: Flecainide, metopr, apxiaban.  . Peripheral neuropathy ([place]) 2008   Idiopathic vs familial (motor>sensory): gabapentin helpful  . PONV (postoperative nausea and vomiting)   . Scoliosis   . Shoulder pain left   RC surg 09/2014    Past Surgical History:  Procedure Laterality Date  . BREAST SURGERY  Nov 2010   Benign biopsy, right  . CHOLECYSTECTOMY  03/2010   Dr.Gross  . COLONOSCOPY  10/03/2002   No polyps.  Repeat  10 yrs (Dr. Olevia Perches)  . COMBINED HYSTERECTOMY VAGINAL / OOPHORECTOMY / A&P REPAIR  0000000   uncertain if ovaries were removed or not  . CYSTOCELE REPAIR    . Lumpectomy  06/2009   "Fatty Necrosis"  . PFT's  2014   NORMAL  . ROTATOR CUFF REPAIR Left 10/02/14   Dr. Veverly Fells  . TONSILLECTOMY    . TOTAL KNEE ARTHROPLASTY  09/2009   Right ;Dr Alvan Dame  . TOTAL KNEE ARTHROPLASTY Left 03/10/2016   Procedure: TOTAL KNEE ARTHROPLASTY;  Surgeon: Paralee Cancel, MD;  Location: [facility];  Service: Orthopedics;  Laterality: Left;  . TRANSTHORACIC ECHOCARDIOGRAM  09/2012   EF 60-65%, Gr 1 DD, mild AI, mild bileaflet MVP, mod post directed MR, mild LAE, PASP 35.  Marland Kitchen VAGINAL HYSTERECTOMY     For Uterine Deviation     There were no vitals filed for this visit.      Subjective Assessment - 05/16/16 0737    Subjective Reports that she has no pain currently and worked on her knee yesterday.   Limitations Walking   How long can you sit comfortably? No limitation   How long can you stand comfortably? No limitation   How long can you walk comfortably? 15 minutes   Patient Stated Goals  Get out of pain and walk better.   Currently in Pain? No/denies            [facility] PT Assessment - 05/16/16 0001      Assessment   Medical Diagnosis Left total knee replacement.   Onset Date/Surgical Date 03/10/16   Next MD Visit 06/2016     Restrictions   Weight Bearing Restrictions No     AROM   Overall AROM  Deficits   AROM Assessment Site Knee   Right/Left Knee Left   Left Knee Extension -5                     [facility] Adult PT Treatment/Exercise - 05/16/16 0001      Knee/Hip Exercises: Aerobic   Nustep L6, seat 7 x11 min LE/UE     Knee/Hip Exercises: Machines for Strengthening   Cybex Knee Extension 10# 3x10 reps   Cybex Knee Flexion 30# 3x10 reps     Knee/Hip Exercises: Standing   Hip Abduction Stengthening;Left;2 sets;10 reps;Knee straight   Abduction Limitations 4#   Step Down Left;3 sets;10  reps;Hand Hold: 2;Step Height: 6"     Knee/Hip Exercises: Seated   Sit to Sand 3 sets;10 reps;without UE support     Manual Therapy   Manual Therapy Soft tissue mobilization;Passive ROM   Soft tissue mobilization L patellar mobilizations in sup/inf, lat/med to promote proper mobility   Passive ROM PROM of L knee into extension with gentle holds at end range                  PT Short Term Goals - 05/14/16 1151      PT SHORT TERM GOAL #1   Title Ind with a HEP.   Time 2   Period Weeks   Status Achieved     PT SHORT TERM GOAL #2   Title Full left knee extension.   Time 2   Period Weeks   Status On-going  AROM -16 degrees 05/14/16           PT Long Term Goals - 04/28/16 1252      PT LONG TERM GOAL #1   Title Independent with an advanced HEP.   Time 8   Period Weeks   Status Achieved     PT LONG TERM GOAL #2   Title Active left  knee flexion to 115 degrees+ so the patient can perform functional tasks and do so with pain not > 2-3/10.   Time 8   Period Weeks   Status Achieved     PT LONG TERM GOAL #3   Title Increase left knee strength to a solid 4+/5 to provide good stability for accomplishment of functional activities   Time 8   Period Weeks   Status Achieved  Quad 5/5, HS 4+/5, hip abd 5/5 04/28/16     PT LONG TERM GOAL #4   Title Decrease edema to within 2 cms of non-affected side to assist with pain reduction and range of motion gains.   Time 8   Period Weeks   Status Achieved     PT LONG TERM GOAL #5   Title Perform a reciprocating stair gait with one railing with pain not > 2-3/10.   Time 8   Period Weeks   Status On-going  able to perform non-reciprocating at this time 04/28/16               Plan - 05/16/16 0823    Clinical Impression  Statement Patient arrived to treatment with reports of no current L knee pain. Patient able to complete all exercises as directed without complaint. Patient required moderate multimodal cueing for  proper technique regarding step down. Firm end feel noted with PROM of L knee into extension and good patellar mobilziation noted in all directions assessed. AROM L knee extension -5 deg from neutral today in supine. Patient experienced L knee soreness upon end of treatment.   Rehab Potential Good   PT Frequency 3x / week   PT Duration 4 weeks   PT Treatment/Interventions Electrical Stimulation;Cryotherapy;Therapeutic activities;Therapeutic exercise;Patient/family education;Passive range of motion;Vasopneumatic Device;Manual techniques   PT Next Visit Plan cont per MPT, follow up sent for re-cert   Consulted and Agree with Plan of Care Patient      Patient will benefit from skilled therapeutic intervention in order to improve the following deficits and impairments:  Pain, Increased edema, Decreased strength, Decreased activity tolerance, Decreased range of motion  Visit Diagnosis: Stiffness of left knee, not elsewhere classified  Localized edema  Left knee pain, unspecified chronicity     Problem List Patient Active Problem List   Diagnosis Date Noted  . 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 Paroxysmal atrial fibrillation with rapid ventricular response ([place]) 09/28/2015  . Maxillary sinusitis, acute 08/27/2015  . Acute bronchitis 09/08/2014  . Mitral insufficiency 05/12/2014  . HTN (hypertension) 04/28/2014  . Lactic acidosis 09/07/2013  . Mitral valve prolapse 06/14/2013  . Chest pain, exertional 09/13/2012  . Pernicious anemia 09/07/2012  . DIVERTICULOSIS, COLON 09/06/2009  . Osteoporosis 09/06/2009  . Hyperlipidemia 09/04/2009  . PERIPHERAL NEUROPATHY 09/04/2009  . LOW BACK PAIN, CHRONIC 04/08/2007    Wynelle Fanny, PTA 05/16/2016, 8:36 AM  [facility] [address] [place], [place], [address] Phone: [phone]   Fax:  [phone]  Name: JEANELLY KEISTER MRN: [id number] Date of Birth:  Jan 10, 1941

## 2016-05-19 ENCOUNTER — Ambulatory Visit: Payer: Medicare Other | Admitting: Physical Therapy

## 2016-05-19 DIAGNOSIS — R600 Localized edema: Secondary | ICD-10-CM | POA: Diagnosis not present

## 2016-05-19 DIAGNOSIS — M25662 Stiffness of left knee, not elsewhere classified: Secondary | ICD-10-CM | POA: Diagnosis not present

## 2016-05-19 DIAGNOSIS — M25562 Pain in left knee: Secondary | ICD-10-CM | POA: Diagnosis not present

## 2016-05-19 NOTE — Therapy (Signed)
[facility] [place], [place], [address] Phone: [phone]   Fax:  [phone]  Physical Therapy Treatment  Patient Details  Name: Cassie White MRN: [id number] Date of Birth: Jul 24, 1941 Referring Provider: Paralee Cancel MD  Encounter Date: 05/19/2016      PT End of Session - 05/19/16 0947    Visit Number 20   Date for PT Re-Evaluation 05/17/16   PT Start Time 0945   PT Stop Time 1028   PT Time Calculation (min) 43 min   Behavior During Therapy [facility] for tasks assessed/performed      Past Medical History:  Diagnosis Date  . Arthritis    DJD, back and both hips  . Chronic low back pain without sciatica    DDD + scoliosis.  As of 12/04/15, Dr. Nelva Bush will do a back injection and follow her up in 1 mo.  . Diverticulosis    on colonoscopy 2004  . GERD (gastroesophageal reflux disease)   . Hx of cardiovascular stress test    Lexiscan Myoview (2/14):  Low risk, small ant defect likely breast attenuation, no ischemia, EF 69% (no change from 2010).    . Hyperlipidemia   . Hypertension    per pt  . Mitral regurgitation    Echo (2/14):  EF 60-65%, Gr 1 DD, mild AI, mild bileaflet MVP, mod post directed MR, mild LAE, PASP 35.  Marland Kitchen MVP (mitral valve prolapse)   . Osteoporosis, senile    stable at the left hip 2014-2015.  Took fosamax for 10 yrs.  Plan: get repeat DEXA q59mo  .Marland KitchenPAF (paroxysmal atrial fibrillation) (HCC)    Dr. JMartinique Flecainide, metopr, apxiaban.  . Peripheral neuropathy ([place] 2008   Idiopathic vs familial (motor>sensory): gabapentin helpful  . PONV (postoperative nausea and vomiting)   . Scoliosis   . Shoulder pain left   RC surg 09/2014    Past Surgical History:  Procedure Laterality Date  . BREAST SURGERY  Nov 2010   Benign biopsy, right  . CHOLECYSTECTOMY  03/2010   Dr.Gross  . COLONOSCOPY  10/03/2002   No polyps.  Repeat 10 yrs (Dr. BOlevia Perches  . COMBINED HYSTERECTOMY VAGINAL / OOPHORECTOMY / A&P  REPAIR  17341  uncertain if ovaries were removed or not  . CYSTOCELE REPAIR    . Lumpectomy  06/2009   "Fatty Necrosis"  . PFT's  2014   NORMAL  . ROTATOR CUFF REPAIR Left 10/02/14   Dr. NVeverly Fells . TONSILLECTOMY    . TOTAL KNEE ARTHROPLASTY  09/2009   Right ;Dr OAlvan Dame . TOTAL KNEE ARTHROPLASTY Left 03/10/2016   Procedure: TOTAL KNEE ARTHROPLASTY;  Surgeon: MParalee Cancel MD;  Location: [facility];  Service: Orthopedics;  Laterality: Left;  . TRANSTHORACIC ECHOCARDIOGRAM  09/2012   EF 60-65%, Gr 1 DD, mild AI, mild bileaflet MVP, mod post directed MR, mild LAE, PASP 35.  .Marland KitchenVAGINAL HYSTERECTOMY     For Uterine Deviation     There were no vitals filed for this visit.      Subjective Assessment - 05/19/16 0948    Subjective Patient has no complaints.   Currently in Pain? No/denies            [facility] - 05/19/16 0001      AROM   Left Knee Extension -8   Left Knee Flexion 117     PROM   Left Knee Extension -5   Left Knee Flexion 125  [facility] Adult PT Treatment/Exercise - 05/19/16 0001      Knee/Hip Exercises: Aerobic   Nustep L6, seat 7 x11 min LE/UE     Knee/Hip Exercises: Machines for Strengthening   Cybex Knee Extension 10# 3x10 reps   Cybex Knee Flexion 30# 3x10 reps     Knee/Hip Exercises: Standing   Forward Step Up Left;20 reps;Hand Hold: 2;Step Height: 8"   Forward Step Up Limitations VCs to squeeze quads/gluts   Step Down Right;4 sets;5 reps;Hand Hold: 2;Step Height: 8"   Functional Squat 15 reps  to chair with foam riser   Functional Squat Limitations shifts to R   Wall Squat 10 reps   Wall Squat Limitations VCs to WB equally   Other Standing Knee Exercises sit to stand x 10 VCs for knees straight and equal WB                PT Education - 05/19/16 1030    Education provided Yes   Education Details HEP   Person(s) Educated Patient   Methods Explanation;Demonstration;Verbal cues;Handout   Comprehension  Verbalized understanding;Returned demonstration          PT Short Term Goals - 05/19/16 1035      PT SHORT TERM GOAL #1   Title Ind with a HEP.   Time 2   Period Weeks   Status Achieved     PT SHORT TERM GOAL #2   Title Full left knee extension.   Time 2   Period Weeks   Status Not Met           PT Long Term Goals - 05/19/16 0950      PT LONG TERM GOAL #1   Title Independent with an advanced HEP.   Time 8   Period Weeks   Status Achieved     PT LONG TERM GOAL #2   Title Active left  knee flexion to 115 degrees+ so the patient can perform functional tasks and do so with pain not > 2-3/10.   Time 8   Period Weeks   Status Achieved     PT LONG TERM GOAL #3   Title Increase left knee strength to a solid 4+/5 to provide good stability for accomplishment of functional activities   Time 8   Period Weeks   Status Achieved     PT LONG TERM GOAL #4   Title Decrease edema to within 2 cms of non-affected side to assist with pain reduction and range of motion gains.   Time 8   Period Weeks   Status Achieved     PT LONG TERM GOAL #5   Title Perform a reciprocating stair gait with one railing with pain not > 2-3/10.   Time 8   Period Weeks   Status Achieved               Plan - 05/19/16 1031    Clinical Impression Statement Patient has met all LTGs. She still demonstrates some functional weakness in the LLE, but avoids compensation with VCs. Additionally, patient has not achieved full knee extension. Passively she has -5 degrees and is compliant with stretching. Patient should continue to gain strength and ROM with HEP.   Rehab Potential Good   PT Frequency 3x / week   PT Duration 4 weeks   PT Treatment/Interventions Electrical Stimulation;Cryotherapy;Therapeutic activities;Therapeutic exercise;Patient/family education;Passive range of motion;Vasopneumatic Device;Manual techniques   PT Next Visit Plan see d/c summary   PT Home Exercise Plan step ups/downs,  sit  to stand and wall squats   Consulted and Agree with Plan of Care Patient      Patient will benefit from skilled therapeutic intervention in order to improve the following deficits and impairments:  Pain, Increased edema, Decreased strength, Decreased activity tolerance, Decreased range of motion  Visit Diagnosis: Stiffness of left knee, not elsewhere classified       G-Codes - May 22, 2016 1033    Functional Assessment Tool Used Clinical judgement....20th visit. D/C visit.   Functional Limitation Mobility: Walking and moving around   Mobility: Walking and Moving Around Current Status [phone]) At least 1 percent but less than 20 percent impaired, limited or restricted   Mobility: Walking and Moving Around Goal Status [phone]) At least 1 percent but less than 20 percent impaired, limited or restricted   Mobility: Walking and Moving Around Discharge Status [phone]) At least 1 percent but less than 20 percent impaired, limited or restricted      Problem List Patient Active Problem List   Diagnosis Date Noted  . 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 Paroxysmal atrial fibrillation with rapid ventricular response ([place]) 09/28/2015  . Maxillary sinusitis, acute 08/27/2015  . Acute bronchitis 09/08/2014  . Mitral insufficiency 05/12/2014  . HTN (hypertension) 04/28/2014  . Lactic acidosis 09/07/2013  . Mitral valve prolapse 06/14/2013  . Chest pain, exertional 09/13/2012  . Pernicious anemia 09/07/2012  . DIVERTICULOSIS, COLON 09/06/2009  . Osteoporosis 09/06/2009  . Hyperlipidemia 09/04/2009  . PERIPHERAL NEUROPATHY 09/04/2009  . LOW BACK PAIN, CHRONIC 04/08/2007   Madelyn Flavors PT 22-May-2016, 10:37 AM  [facility] [address] [place], [place], [address] Phone: [phone]   Fax:  [phone]  Name: Cassie White MRN: [id number] Date of Birth: February 22, 1941   PHYSICAL THERAPY DISCHARGE SUMMARY  Visits from  Start of Care: 20  Current functional level related to goals / functional outcomes: See above.   Remaining deficits: See above.   Education / Equipment: HEP Plan: Patient agrees to discharge.  Patient goals were partially met. Patient is being discharged due to being pleased with the current functional level.  ?????         Madelyn Flavors, PT 05-22-2016 10:39 AM [facility] [address] [place], [place], [address] Phone: [phone]   Fax:  [phone]

## 2016-05-19 NOTE — Patient Instructions (Signed)
Back Wall Slide   With feet _10___ inches from wall, lean as much of back against the wall as possible. Gently squat down a safe distance, keeping back against wall. Hold _3-5___ seconds while counting out loud. Repeat _10-30__ times. Do _1-2__ sessions per day.    Sit to Stand (you may put a cushion on the chair to make it a higher surface)    Sit on edge of chair, feet flat on floor. Stand upright, extending knees fully. KEEP KNEES STRAIGHT, NOT TOUCHING. KEEP WEIGHT EQUAL THROUGH BOTH LEGS. Repeat _10___ times per set. Do _1-3___ sets per session. Do __1__ sessions per day.   Anterior Step-Down    Stand with both feet on a step. Step down forward with the Right foot slowly so the foot does not just plop! Return to start position. Repeat. 1-3 sets of 10 reps. 1-2 times per day.  Anterior Step-Up    Step onto step with the left foot without pushing off with the ground foot. Squeeze your thigh and buttocks.  Finish with right foot in touch balance on step and return _10__ times. _1-3__ sets _1-2__ times per day.   Madelyn Flavors, PT 05/19/16 10:24 AM [facility] [address] [place], [place], [address] Phone: [phone]   Fax:  [phone]

## 2016-05-21 ENCOUNTER — Encounter: Payer: Medicare Other | Admitting: Physical Therapy

## 2016-05-23 ENCOUNTER — Encounter: Payer: Medicare Other | Admitting: Physical Therapy

## 2016-06-18 ENCOUNTER — Telehealth: Payer: Self-pay

## 2016-06-18 NOTE — Telephone Encounter (Signed)
LM for patient to return call to schedule AWV.   

## 2016-07-02 DIAGNOSIS — M545 Low back pain: Secondary | ICD-10-CM | POA: Diagnosis not present

## 2016-07-02 DIAGNOSIS — M7062 Trochanteric bursitis, left hip: Secondary | ICD-10-CM | POA: Diagnosis not present

## 2016-07-02 DIAGNOSIS — M4120 Other idiopathic scoliosis, site unspecified: Secondary | ICD-10-CM | POA: Diagnosis not present

## 2016-07-02 DIAGNOSIS — Z4689 Encounter for fitting and adjustment of other specified devices: Secondary | ICD-10-CM | POA: Diagnosis not present

## 2016-07-02 DIAGNOSIS — M549 Dorsalgia, unspecified: Secondary | ICD-10-CM | POA: Diagnosis not present

## 2016-07-04 ENCOUNTER — Ambulatory Visit: Payer: Medicare Other

## 2016-07-09 ENCOUNTER — Ambulatory Visit: Payer: Medicare Other | Attending: Orthopedic Surgery | Admitting: Physical Therapy

## 2016-07-09 ENCOUNTER — Other Ambulatory Visit: Payer: Self-pay | Admitting: Orthopaedic Surgery

## 2016-07-09 DIAGNOSIS — G8929 Other chronic pain: Secondary | ICD-10-CM

## 2016-07-09 DIAGNOSIS — M4120 Other idiopathic scoliosis, site unspecified: Secondary | ICD-10-CM

## 2016-07-09 DIAGNOSIS — R293 Abnormal posture: Secondary | ICD-10-CM

## 2016-07-09 DIAGNOSIS — M545 Low back pain: Secondary | ICD-10-CM | POA: Insufficient documentation

## 2016-07-09 NOTE — Therapy (Signed)
[facility] [place], [place], [address] Phone: [phone]   Fax:  [phone]  Physical Therapy Evaluation  Patient Details  Name: Cassie White MRN: [id number] Date of Birth: January 11, 1941 Referring Provider: Rennis Harding MD  Encounter Date: 07/09/2016      PT End of Session - 07/09/16 1610    Visit Number 1   Number of Visits 16   Date for PT Re-Evaluation 09/07/16   PT Start Time 1115   PT Stop Time 1202   PT Time Calculation (min) 47 min   Activity Tolerance Patient tolerated treatment well   Behavior During Therapy [facility] for tasks assessed/performed      Past Medical History:  Diagnosis Date  . Arthritis    DJD, back and both hips  . Chronic low back pain without sciatica    DDD + scoliosis.  As of 12/04/15, Dr. Nelva Bush will do a back injection and follow her up in 1 mo.  . Diverticulosis    on colonoscopy 2004  . GERD (gastroesophageal reflux disease)   . Hx of cardiovascular stress test    Lexiscan Myoview (2/14):  Low risk, small ant defect likely breast attenuation, no ischemia, EF 69% (no change from 2010).    . Hyperlipidemia   . Hypertension    per pt  . Mitral regurgitation    Echo (2/14):  EF 60-65%, Gr 1 DD, mild AI, mild bileaflet MVP, mod post directed MR, mild LAE, PASP 35.  Marland Kitchen MVP (mitral valve prolapse)   . Osteoporosis, senile    stable at the left hip 2014-2015.  Took fosamax for 10 yrs.  Plan: get repeat DEXA q41mo.  Marland Kitchen PAF (paroxysmal atrial fibrillation) (HCC)    Dr. Martinique: Flecainide, metopr, apxiaban.  . Peripheral neuropathy ([place]) 2008   Idiopathic vs familial (motor>sensory): gabapentin helpful  . PONV (postoperative nausea and vomiting)   . Scoliosis   . Shoulder pain left   RC surg 09/2014    Past Surgical History:  Procedure Laterality Date  . BREAST SURGERY  Nov 2010   Benign biopsy, right  . CHOLECYSTECTOMY  03/2010   Dr.Gross  . COLONOSCOPY  10/03/2002   No polyps.  Repeat 10  yrs (Dr. Olevia Perches)  . COMBINED HYSTERECTOMY VAGINAL / OOPHORECTOMY / A&P REPAIR  0000000   uncertain if ovaries were removed or not  . CYSTOCELE REPAIR    . Lumpectomy  06/2009   "Fatty Necrosis"  . PFT's  2014   NORMAL  . ROTATOR CUFF REPAIR Left 10/02/14   Dr. Veverly Fells  . TONSILLECTOMY    . TOTAL KNEE ARTHROPLASTY  09/2009   Right ;Dr Alvan Dame  . TOTAL KNEE ARTHROPLASTY Left 03/10/2016   Procedure: TOTAL KNEE ARTHROPLASTY;  Surgeon: Paralee Cancel, MD;  Location: [facility];  Service: Orthopedics;  Laterality: Left;  . TRANSTHORACIC ECHOCARDIOGRAM  09/2012   EF 60-65%, Gr 1 DD, mild AI, mild bileaflet MVP, mod post directed MR, mild LAE, PASP 35.  Marland Kitchen VAGINAL HYSTERECTOMY     For Uterine Deviation     There were no vitals filed for this visit.       Subjective Assessment - 07/09/16 1547    Subjective The patient reports a h/o low back pain dating back to 2000.  Her current pain-level is a 7-8/10.  Sitting decreases her pain and standing increases her pain-level.   Pain Score 8    Pain Location Back   Pain Orientation Mid;Lower;Right;Left   Pain Descriptors / Indicators  Aching   Pain Type Chronic pain   Pain Onset More than a month ago   Pain Frequency Constant   Aggravating Factors  See above.            [facility] PT Assessment - 07/09/16 0001      Assessment   Medical Diagnosis Low back pain.   Referring Provider Rennis Harding MD   Onset Date/Surgical Date --  2000.     Precautions   Precautions None     Restrictions   Weight Bearing Restrictions No     Balance Screen   Has the patient fallen in the past 6 months No   Has the patient had a decrease in activity level because of a fear of falling?  Yes   Is the patient reluctant to leave their home because of a fear of falling?  No     Home Environment   Living Environment Private residence     Prior Function   Level of Independence Independent     Posture/Postural Control   Posture Comments Notable lumbat scolosis; loss lumbar  lordosis; unequal hip heights.     AROM   Overall AROM Comments WFL for LE (bilateral TKA's).     Strength   Overall Strength Comments Essentially normal bilateral LE strength.     Palpation   Palpation comment ery tender to palpation at L5-S1 and bilaterally across from this region.     Ambulation/Gait   Gait Comments The patient walks with a cane with spinal flexion in obvious pain.  She transitions from sit to stand and supine to sit slowly.                   [facility] Adult PT Treatment/Exercise - 07/09/16 0001      Electrical Stimulation   Electrical Stimulation Location Low back.   Electrical Stimulation Action IFC   Electrical Stimulation Parameters 80-150 Hz x 20 minutes.   Electrical Stimulation Goals Pain                  PT Short Term Goals - 07/09/16 1616      PT SHORT TERM GOAL #1   Title STG's=LTG's.           PT Long Term Goals - 07/09/16 1617      PT LONG TERM GOAL #1   Title Independent with a HEP.   Time 8   Period Weeks   Status New     PT LONG TERM GOAL #2   Title Stand 20 minutes with pain not > 3-4/10.   Time 8   Period Weeks   Status New     PT LONG TERM GOAL #3   Title Perform ADL's with pain not > 4/10.   Time 8   Status New               Plan - 07/09/16 1611    Clinical Impression Statement The patient presents with chronic low back pain and significant scoliosis.  Her CC is that of pain in her lower lumbar region that radiates to both sides.  Achilles reflexes are absent.   Rehab Potential Good   PT Frequency 2x / week   PT Duration 6 weeks   PT Treatment/Interventions ADLs/Self Care Home Management;Electrical Stimulation;Moist Heat;Ultrasound;Therapeutic activities;Therapeutic exercise;Patient/family education;Manual techniques;Dry needling   PT Next Visit Plan STW/M; U/S; HMP and electrical stimulation and low level lumbar exercises.      Patient will benefit from skilled therapeutic intervention in  order to improve the following deficits and impairments:  Pain, Decreased activity tolerance, Decreased range of motion  Visit Diagnosis: Chronic midline low back pain, with sciatica presence unspecified - Plan: PT plan of care cert/re-cert  Abnormal posture - Plan: PT plan of care cert/re-cert      G-Codes - [id number]    Functional Assessment Tool Used Clinical judgement....   Functional Limitation Mobility: Walking and moving around   Mobility: Walking and Moving Around Current Status [phone]) At least 40 percent but less than 60 percent impaired, limited or restricted   Mobility: Walking and Moving Around Goal Status [phone]) At least 1 percent but less than 20 percent impaired, limited or restricted       Problem List Patient Active Problem List   Diagnosis Date Noted  . 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 Paroxysmal atrial fibrillation with rapid ventricular response ([place]) 09/28/2015  . Maxillary sinusitis, acute 08/27/2015  . Acute bronchitis 09/08/2014  . Mitral insufficiency 05/12/2014  . HTN (hypertension) 04/28/2014  . Lactic acidosis 09/07/2013  . Mitral valve prolapse 06/14/2013  . Chest pain, exertional 09/13/2012  . Pernicious anemia 09/07/2012  . DIVERTICULOSIS, COLON 09/06/2009  . Osteoporosis 09/06/2009  . Hyperlipidemia 09/04/2009  . PERIPHERAL NEUROPATHY 09/04/2009  . LOW BACK PAIN, CHRONIC 04/08/2007    Yizel Canby, [place] MPT 07/09/2016, 4:35 PM  [facility] [address] [place], [place], [address] Phone: [phone]   Fax:  [phone]  Name: Cassie White MRN: [id number] Date of Birth: [birth year]

## 2016-07-10 ENCOUNTER — Other Ambulatory Visit: Payer: Self-pay

## 2016-07-14 ENCOUNTER — Ambulatory Visit: Payer: Medicare Other | Admitting: Physical Therapy

## 2016-07-14 DIAGNOSIS — M545 Low back pain: Secondary | ICD-10-CM | POA: Diagnosis not present

## 2016-07-14 DIAGNOSIS — G8929 Other chronic pain: Secondary | ICD-10-CM

## 2016-07-14 DIAGNOSIS — R293 Abnormal posture: Secondary | ICD-10-CM

## 2016-07-14 NOTE — Therapy (Signed)
[facility] [place], [place], [address] Phone: [phone]   Fax:  [phone]  Physical Therapy Treatment  Patient Details  Name: Cassie White MRN: [id number] Date of Birth: Jul 07, 1941 Referring Provider: Rennis Harding MD  Encounter Date: 07/14/2016      PT End of Session - 07/14/16 1345    Visit Number 2   Number of Visits 16   Date for PT Re-Evaluation 09/07/16   PT Start Time 0902   PT Stop Time 0952   PT Time Calculation (min) 50 min   Activity Tolerance Patient tolerated treatment well   Behavior During Therapy [facility] for tasks assessed/performed      Past Medical History:  Diagnosis Date  . Arthritis    DJD, back and both hips  . Chronic low back pain without sciatica    DDD + scoliosis.  As of 12/04/15, Dr. Nelva Bush will do a back injection and follow her up in 1 mo.  . Diverticulosis    on colonoscopy 2004  . GERD (gastroesophageal reflux disease)   . Hx of cardiovascular stress test    Lexiscan Myoview (2/14):  Low risk, small ant defect likely breast attenuation, no ischemia, EF 69% (no change from 2010).    . Hyperlipidemia   . Hypertension    per pt  . Mitral regurgitation    Echo (2/14):  EF 60-65%, Gr 1 DD, mild AI, mild bileaflet MVP, mod post directed MR, mild LAE, PASP 35.  Marland Kitchen MVP (mitral valve prolapse)   . Osteoporosis, senile    stable at the left hip 2014-2015.  Took fosamax for 10 yrs.  Plan: get repeat DEXA q39mo.  Marland Kitchen PAF (paroxysmal atrial fibrillation) (HCC)    Dr. Martinique: Flecainide, metopr, apxiaban.  . Peripheral neuropathy ([place]) 2008   Idiopathic vs familial (motor>sensory): gabapentin helpful  . PONV (postoperative nausea and vomiting)   . Scoliosis   . Shoulder pain left   RC surg 09/2014    Past Surgical History:  Procedure Laterality Date  . BREAST SURGERY  Nov 2010   Benign biopsy, right  . CHOLECYSTECTOMY  03/2010   Dr.Gross  . COLONOSCOPY  10/03/2002   No polyps.  Repeat 10  yrs (Dr. Olevia Perches)  . COMBINED HYSTERECTOMY VAGINAL / OOPHORECTOMY / A&P REPAIR  0000000   uncertain if ovaries were removed or not  . CYSTOCELE REPAIR    . Lumpectomy  06/2009   "Fatty Necrosis"  . PFT's  2014   NORMAL  . ROTATOR CUFF REPAIR Left 10/02/14   Dr. Veverly Fells  . TONSILLECTOMY    . TOTAL KNEE ARTHROPLASTY  09/2009   Right ;Dr Alvan Dame  . TOTAL KNEE ARTHROPLASTY Left 03/10/2016   Procedure: TOTAL KNEE ARTHROPLASTY;  Surgeon: Paralee Cancel, MD;  Location: [facility];  Service: Orthopedics;  Laterality: Left;  . TRANSTHORACIC ECHOCARDIOGRAM  09/2012   EF 60-65%, Gr 1 DD, mild AI, mild bileaflet MVP, mod post directed MR, mild LAE, PASP 35.  Marland Kitchen VAGINAL HYSTERECTOMY     For Uterine Deviation     There were no vitals filed for this visit.      Subjective Assessment - 07/14/16 1340    Subjective I'm okay today.   Pain Score 7    Pain Location Back   Pain Orientation Right;Lower;Left;Mid   Pain Descriptors / Indicators Aching   Pain Type Chronic pain   Pain Onset More than a month ago    Treatment:  Right sdly position with pillow between  knees for comfort:  U/S at 1.50 W/CM2 x 12 minutes to low back f/b STW/M x 11 minutes f/b HMP and IFC x 15 minutes.                               PT Short Term Goals - 07/09/16 1616      PT SHORT TERM GOAL #1   Title STG's=LTG's.           PT Long Term Goals - 07/09/16 1617      PT LONG TERM GOAL #1   Title Independent with a HEP.   Time 8   Period Weeks   Status New     PT LONG TERM GOAL #2   Title Stand 20 minutes with pain not > 3-4/10.   Time 8   Period Weeks   Status New     PT LONG TERM GOAL #3   Title Perform ADL's with pain not > 4/10.   Time 8   Status New             Patient will benefit from skilled therapeutic intervention in order to improve the following deficits and impairments:  Pain, Decreased activity tolerance, Decreased range of motion  Visit Diagnosis: Chronic midline low back  pain, with sciatica presence unspecified  Abnormal posture     Problem List Patient Active Problem List   Diagnosis Date Noted  . 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 Paroxysmal atrial fibrillation with rapid ventricular response ([place]) 09/28/2015  . Maxillary sinusitis, acute 08/27/2015  . Acute bronchitis 09/08/2014  . Mitral insufficiency 05/12/2014  . HTN (hypertension) 04/28/2014  . Lactic acidosis 09/07/2013  . Mitral valve prolapse 06/14/2013  . Chest pain, exertional 09/13/2012  . Pernicious anemia 09/07/2012  . DIVERTICULOSIS, COLON 09/06/2009  . Osteoporosis 09/06/2009  . Hyperlipidemia 09/04/2009  . PERIPHERAL NEUROPATHY 09/04/2009  . LOW BACK PAIN, CHRONIC 04/08/2007    Sally-Anne Wamble, [place] MPT 07/14/2016, 1:45 PM  [facility] [address] [place], [place], [address] Phone: [phone]   Fax:  [phone]  Name: Cassie White MRN: [id number] Date of Birth: Feb 20, 1941

## 2016-07-17 ENCOUNTER — Ambulatory Visit: Payer: Medicare Other | Admitting: Physical Therapy

## 2016-07-17 DIAGNOSIS — M545 Low back pain: Secondary | ICD-10-CM | POA: Diagnosis not present

## 2016-07-17 DIAGNOSIS — R293 Abnormal posture: Secondary | ICD-10-CM

## 2016-07-17 DIAGNOSIS — G8929 Other chronic pain: Secondary | ICD-10-CM

## 2016-07-17 NOTE — Therapy (Signed)
[facility] [place], [place], [address] Phone: [phone]   Fax:  [phone]  Physical Therapy Treatment  Patient Details  Name: Cassie White MRN: [id number] Date of Birth: Jul 23, 1941 Referring Provider: Rennis Harding MD  Encounter Date: 07/17/2016      PT End of Session - 07/17/16 1625    Visit Number 3   Number of Visits 16   Date for PT Re-Evaluation 09/07/16   PT Start Time 0145   PT Stop Time 0233   PT Time Calculation (min) 48 min   Activity Tolerance Patient tolerated treatment well   Behavior During Therapy [facility] for tasks assessed/performed      Past Medical History:  Diagnosis Date  . Arthritis    DJD, back and both hips  . Chronic low back pain without sciatica    DDD + scoliosis.  As of 12/04/15, Dr. Nelva Bush will do a back injection and follow her up in 1 mo.  . Diverticulosis    on colonoscopy 2004  . GERD (gastroesophageal reflux disease)   . Hx of cardiovascular stress test    Lexiscan Myoview (2/14):  Low risk, small ant defect likely breast attenuation, no ischemia, EF 69% (no change from 2010).    . Hyperlipidemia   . Hypertension    per pt  . Mitral regurgitation    Echo (2/14):  EF 60-65%, Gr 1 DD, mild AI, mild bileaflet MVP, mod post directed MR, mild LAE, PASP 35.  Marland Kitchen MVP (mitral valve prolapse)   . Osteoporosis, senile    stable at the left hip 2014-2015.  Took fosamax for 10 yrs.  Plan: get repeat DEXA q84mo.  Marland Kitchen PAF (paroxysmal atrial fibrillation) (HCC)    Dr. Martinique: Flecainide, metopr, apxiaban.  . Peripheral neuropathy ([place]) 2008   Idiopathic vs familial (motor>sensory): gabapentin helpful  . PONV (postoperative nausea and vomiting)   . Scoliosis   . Shoulder pain left   RC surg 09/2014    Past Surgical History:  Procedure Laterality Date  . BREAST SURGERY  Nov 2010   Benign biopsy, right  . CHOLECYSTECTOMY  03/2010   Dr.Gross  . COLONOSCOPY  10/03/2002   No polyps.  Repeat 10  yrs (Dr. Olevia Perches)  . COMBINED HYSTERECTOMY VAGINAL / OOPHORECTOMY / A&P REPAIR  0000000   uncertain if ovaries were removed or not  . CYSTOCELE REPAIR    . Lumpectomy  06/2009   "Fatty Necrosis"  . PFT's  2014   NORMAL  . ROTATOR CUFF REPAIR Left 10/02/14   Dr. Veverly Fells  . TONSILLECTOMY    . TOTAL KNEE ARTHROPLASTY  09/2009   Right ;Dr Alvan Dame  . TOTAL KNEE ARTHROPLASTY Left 03/10/2016   Procedure: TOTAL KNEE ARTHROPLASTY;  Surgeon: Paralee Cancel, MD;  Location: [facility];  Service: Orthopedics;  Laterality: Left;  . TRANSTHORACIC ECHOCARDIOGRAM  09/2012   EF 60-65%, Gr 1 DD, mild AI, mild bileaflet MVP, mod post directed MR, mild LAE, PASP 35.  Marland Kitchen VAGINAL HYSTERECTOMY     For Uterine Deviation     There were no vitals filed for this visit.      Subjective Assessment - 07/17/16 1626    Subjective Temporary relief from treatments.   Pain Score 7    Pain Location Back   Pain Orientation Right;Lower;Left;Mid   Pain Descriptors / Indicators Aching   Pain Type Chronic pain   Pain Onset More than a month ago     Treatment;  HMP and IFC to  low abck x 15 minutes f/b U/S at 1.50 W/CM2 to patient affected L-S region x 12 minutes and STW/M x 11 minutes in sdly position.  Patient seeing a Neurologist and plans on receiving an injection.                              PT Short Term Goals - 07/09/16 1616      PT SHORT TERM GOAL #1   Title STG's=LTG's.           PT Long Term Goals - 07/09/16 1617      PT LONG TERM GOAL #1   Title Independent with a HEP.   Time 8   Period Weeks   Status New     PT LONG TERM GOAL #2   Title Stand 20 minutes with pain not > 3-4/10.   Time 8   Period Weeks   Status New     PT LONG TERM GOAL #3   Title Perform ADL's with pain not > 4/10.   Time 8   Status New             Patient will benefit from skilled therapeutic intervention in order to improve the following deficits and impairments:  Pain, Decreased activity  tolerance, Decreased range of motion  Visit Diagnosis: Chronic midline low back pain, with sciatica presence unspecified  Abnormal posture     Problem List Patient Active Problem List   Diagnosis Date Noted  . 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 Paroxysmal atrial fibrillation with rapid ventricular response ([place]) 09/28/2015  . Maxillary sinusitis, acute 08/27/2015  . Acute bronchitis 09/08/2014  . Mitral insufficiency 05/12/2014  . HTN (hypertension) 04/28/2014  . Lactic acidosis 09/07/2013  . Mitral valve prolapse 06/14/2013  . Chest pain, exertional 09/13/2012  . Pernicious anemia 09/07/2012  . DIVERTICULOSIS, COLON 09/06/2009  . Osteoporosis 09/06/2009  . Hyperlipidemia 09/04/2009  . PERIPHERAL NEUROPATHY 09/04/2009  . LOW BACK PAIN, CHRONIC 04/08/2007    [place], [place] MPT 07/17/2016, 4:29 PM  [facility] [place], [place], [address] Phone: [phone]   Fax:  [phone]  Name: Cassie White MRN: [id number] Date of Birth: [birth year]

## 2016-07-19 ENCOUNTER — Ambulatory Visit
Admission: RE | Admit: 2016-07-19 | Discharge: 2016-07-19 | Disposition: A | Discharge status: Home / Self Care | Payer: Medicare Other | Source: Ambulatory Visit | Attending: Orthopaedic Surgery | Admitting: Orthopaedic Surgery

## 2016-07-19 DIAGNOSIS — M4120 Other idiopathic scoliosis, site unspecified: Secondary | ICD-10-CM

## 2016-07-19 DIAGNOSIS — M5126 Other intervertebral disc displacement, lumbar region: Secondary | ICD-10-CM | POA: Diagnosis not present

## 2016-07-21 ENCOUNTER — Ambulatory Visit: Payer: Medicare Other | Admitting: Physical Therapy

## 2016-07-21 DIAGNOSIS — M545 Low back pain: Principal | ICD-10-CM

## 2016-07-21 DIAGNOSIS — R293 Abnormal posture: Secondary | ICD-10-CM

## 2016-07-21 DIAGNOSIS — G8929 Other chronic pain: Secondary | ICD-10-CM | POA: Diagnosis not present

## 2016-07-21 NOTE — Therapy (Signed)
[facility] [place], [place], [address] Phone: [phone]   Fax:  [phone]  Physical Therapy Treatment  Patient Details  Name: Cassie White MRN: [id number] Date of Birth: 1940-08-22 Referring Provider: Rennis Harding MD  Encounter Date: 07/21/2016      PT End of Session - 07/21/16 1740    PT Start Time 1105   PT Stop Time 1155   PT Time Calculation (min) 50 min   Activity Tolerance Patient tolerated treatment well   Behavior During Therapy [facility] for tasks assessed/performed      Past Medical History:  Diagnosis Date  . Arthritis    DJD, back and both hips  . Chronic low back pain without sciatica    DDD + scoliosis.  As of 12/04/15, Dr. Nelva Bush will do a back injection and follow her up in 1 mo.  . Diverticulosis    on colonoscopy 2004  . GERD (gastroesophageal reflux disease)   . Hx of cardiovascular stress test    Lexiscan Myoview (2/14):  Low risk, small ant defect likely breast attenuation, no ischemia, EF 69% (no change from 2010).    . Hyperlipidemia   . Hypertension    per pt  . Mitral regurgitation    Echo (2/14):  EF 60-65%, Gr 1 DD, mild AI, mild bileaflet MVP, mod post directed MR, mild LAE, PASP 35.  Marland Kitchen MVP (mitral valve prolapse)   . Osteoporosis, senile    stable at the left hip 2014-2015.  Took fosamax for 10 yrs.  Plan: get repeat DEXA q46mo.  Marland Kitchen PAF (paroxysmal atrial fibrillation) (HCC)    Dr. Martinique: Flecainide, metopr, apxiaban.  . Peripheral neuropathy ([place]) 2008   Idiopathic vs familial (motor>sensory): gabapentin helpful  . PONV (postoperative nausea and vomiting)   . Scoliosis   . Shoulder pain left   RC surg 09/2014    Past Surgical History:  Procedure Laterality Date  . BREAST SURGERY  Nov 2010   Benign biopsy, right  . CHOLECYSTECTOMY  03/2010   Dr.Gross  . COLONOSCOPY  10/03/2002   No polyps.  Repeat 10 yrs (Dr. Olevia Perches)  . COMBINED HYSTERECTOMY VAGINAL / OOPHORECTOMY / A&P REPAIR   0000000   uncertain if ovaries were removed or not  . CYSTOCELE REPAIR    . Lumpectomy  06/2009   "Fatty Necrosis"  . PFT's  2014   NORMAL  . ROTATOR CUFF REPAIR Left 10/02/14   Dr. Veverly Fells  . TONSILLECTOMY    . TOTAL KNEE ARTHROPLASTY  09/2009   Right ;Dr Alvan Dame  . TOTAL KNEE ARTHROPLASTY Left 03/10/2016   Procedure: TOTAL KNEE ARTHROPLASTY;  Surgeon: Paralee Cancel, MD;  Location: [facility];  Service: Orthopedics;  Laterality: Left;  . TRANSTHORACIC ECHOCARDIOGRAM  09/2012   EF 60-65%, Gr 1 DD, mild AI, mild bileaflet MVP, mod post directed MR, mild LAE, PASP 35.  Marland Kitchen VAGINAL HYSTERECTOMY     For Uterine Deviation     There were no vitals filed for this visit.      Subjective Assessment - 07/21/16 1142    Subjective I had my MRI Saturday.  It was actually very comfortable.  My back feels better today.   Pain Score 3    Pain Location Back   Pain Orientation Right;Left;Mid;Lower   Pain Descriptors / Indicators Aching   Pain Type Chronic pain   Pain Onset More than a month ago  [facility] Adult PT Treatment/Exercise - 07/21/16 0001      Modalities   Modalities Electrical Stimulation;Moist Heat;Ultrasound     Moist Heat Therapy   Number Minutes Moist Heat 18 Minutes  Low back.   Moist Heat Location Lumbar Spine     Electrical Stimulation   Electrical Stimulation Location Low back.   Electrical Stimulation Action IFC   Electrical Stimulation Parameters 80-150 Hz x 18 minutes.   Electrical Stimulation Goals Pain     Ultrasound   Ultrasound Location --  Low back.   Ultrasound Parameters 1.50 W/CM2 x 12 minutes.   Ultrasound Goals Pain     Manual Therapy   Manual Therapy Soft tissue mobilization   Passive ROM Rt sdly position with 2 pillows betwen knees for comfort:  STW/M especially in lower lumbar region x 11 minutes.                  PT Short Term Goals - 07/09/16 1616      PT SHORT TERM GOAL #1   Title STG's=LTG's.            PT Long Term Goals - 07/09/16 1617      PT LONG TERM GOAL #1   Title Independent with a HEP.   Time 8   Period Weeks   Status New     PT LONG TERM GOAL #2   Title Stand 20 minutes with pain not > 3-4/10.   Time 8   Period Weeks   Status New     PT LONG TERM GOAL #3   Title Perform ADL's with pain not > 4/10.   Time 8   Status New             Patient will benefit from skilled therapeutic intervention in order to improve the following deficits and impairments:  Pain, Decreased activity tolerance, Decreased range of motion  Visit Diagnosis: Chronic midline low back pain, with sciatica presence unspecified  Abnormal posture     Problem List Patient Active Problem List   Diagnosis Date Noted  . 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 Paroxysmal atrial fibrillation with rapid ventricular response ([place]) 09/28/2015  . Maxillary sinusitis, acute 08/27/2015  . Acute bronchitis 09/08/2014  . Mitral insufficiency 05/12/2014  . HTN (hypertension) 04/28/2014  . Lactic acidosis 09/07/2013  . Mitral valve prolapse 06/14/2013  . Chest pain, exertional 09/13/2012  . Pernicious anemia 09/07/2012  . DIVERTICULOSIS, COLON 09/06/2009  . Osteoporosis 09/06/2009  . Hyperlipidemia 09/04/2009  . PERIPHERAL NEUROPATHY 09/04/2009  . LOW BACK PAIN, CHRONIC 04/08/2007    Lonnie Rosado, [place] MPT 07/21/2016, 5:42 PM  [facility] [address] [place], [place], [address] Phone: [phone]   Fax:  [phone]  Name: Cassie White MRN: [id number] Date of Birth: 05-10-1941

## 2016-07-24 ENCOUNTER — Encounter: Payer: Self-pay | Admitting: Family Medicine

## 2016-07-24 DIAGNOSIS — M4120 Other idiopathic scoliosis, site unspecified: Secondary | ICD-10-CM | POA: Diagnosis not present

## 2016-07-24 DIAGNOSIS — M47816 Spondylosis without myelopathy or radiculopathy, lumbar region: Secondary | ICD-10-CM | POA: Diagnosis not present

## 2016-07-24 DIAGNOSIS — M545 Low back pain: Secondary | ICD-10-CM | POA: Diagnosis not present

## 2016-07-25 ENCOUNTER — Ambulatory Visit: Payer: Medicare Other | Admitting: Physical Therapy

## 2016-07-25 ENCOUNTER — Encounter: Payer: Self-pay | Admitting: Physical Therapy

## 2016-07-25 DIAGNOSIS — G8929 Other chronic pain: Secondary | ICD-10-CM

## 2016-07-25 DIAGNOSIS — R293 Abnormal posture: Secondary | ICD-10-CM

## 2016-07-25 DIAGNOSIS — M545 Low back pain: Principal | ICD-10-CM

## 2016-07-25 NOTE — Therapy (Signed)
[facility] [place], [place], [address] Phone: [phone]   Fax:  [phone]  Physical Therapy Treatment  Patient Details  Name: Cassie White MRN: [id number] Date of Birth: [birth year] Referring Provider: Rennis Harding MD  Encounter Date: 07/25/2016      PT End of Session - 07/25/16 1031    Visit Number 5   Number of Visits 16   Date for PT Re-Evaluation 09/07/16   PT Start Time 1032   PT Stop Time 1114   PT Time Calculation (min) 42 min   Activity Tolerance Patient tolerated treatment well   Behavior During Therapy [facility] for tasks assessed/performed      Past Medical History:  Diagnosis Date  . Arthritis    DJD, back and both hips  . Chronic low back pain without sciatica    Lumbar spondylosis + scoliosis.  Summer 2017 Dr. Nelva Bush did ESI and pt got no relief.  Pt then saw Dr. Maia Petties [facility]: felt she had facet mediated pain; plan for B L345 MBB (??).  Marland Kitchen Diverticulosis    on colonoscopy 2004  . GERD (gastroesophageal reflux disease)   . Hx of cardiovascular stress test    Lexiscan Myoview (2/14):  Low risk, small ant defect likely breast attenuation, no ischemia, EF 69% (no change from 2010).    . Hyperlipidemia   . Hypertension    per pt  . Idiopathic scoliosis of thoracolumbar region   . Mitral regurgitation    Echo (2/14):  EF 60-65%, Gr 1 DD, mild AI, mild bileaflet MVP, mod post directed MR, mild LAE, PASP 35.  Marland Kitchen MVP (mitral valve prolapse)   . Osteoporosis, senile    stable at the left hip 2014-2015.  Took fosamax for 10 yrs.  Plan: get repeat DEXA q11mo.  Marland Kitchen PAF (paroxysmal atrial fibrillation) (HCC)    Dr. Martinique: Flecainide, metopr, apxiaban.  . Peripheral neuropathy ([place]) 2008   Idiopathic vs familial (motor>sensory): gabapentin helpful  . PONV (postoperative nausea and vomiting)   . Shoulder pain left   RC surg 09/2014    Past Surgical History:  Procedure Laterality Date  . BREAST  SURGERY  Nov 2010   Benign biopsy, right  . CHOLECYSTECTOMY  03/2010   Dr.Gross  . COLONOSCOPY  10/03/2002   No polyps.  Repeat 10 yrs (Dr. Olevia Perches)  . COMBINED HYSTERECTOMY VAGINAL / OOPHORECTOMY / A&P REPAIR  0000000   uncertain if ovaries were removed or not  . CYSTOCELE REPAIR    . Lumpectomy  06/2009   "Fatty Necrosis"  . PFT's  2014   NORMAL  . ROTATOR CUFF REPAIR Left 10/02/14   Dr. Veverly Fells  . TONSILLECTOMY    . TOTAL KNEE ARTHROPLASTY  09/2009   Right ;Dr Alvan Dame  . TOTAL KNEE ARTHROPLASTY Left 03/10/2016   Procedure: TOTAL KNEE ARTHROPLASTY;  Surgeon: Paralee Cancel, MD;  Location: [facility];  Service: Orthopedics;  Laterality: Left;  . TRANSTHORACIC ECHOCARDIOGRAM  09/2012   EF 60-65%, Gr 1 DD, mild AI, mild bileaflet MVP, mod post directed MR, mild LAE, PASP 35.  Marland Kitchen VAGINAL HYSTERECTOMY     For Uterine Deviation     There were no vitals filed for this visit.      Subjective Assessment - 07/25/16 1125    Subjective Reports that MD wants her to take shots in her back and that arthritis is the cause. Did not report any pain as she states she has been sitting.   Limitations  Walking   How long can you sit comfortably? No limitation   How long can you stand comfortably? No limitation   How long can you walk comfortably? 15 minutes   Patient Stated Goals Get out of pain and walk better.   Currently in Pain? No/denies            [facility] PT Assessment - 07/25/16 0001      Assessment   Medical Diagnosis Low back pain.     Precautions   Precautions None     Restrictions   Weight Bearing Restrictions No                     [facility] Adult PT Treatment/Exercise - 07/25/16 0001      Modalities   Modalities Electrical Stimulation;Moist Heat;Ultrasound     Moist Heat Therapy   Number Minutes Moist Heat 15 Minutes   Moist Heat Location Lumbar Spine     Electrical Stimulation   Electrical Stimulation Location B lumbar paraspinals   Electrical Stimulation Action Pre-Mod    Electrical Stimulation Parameters 80-150 hz x15 min   Electrical Stimulation Goals Pain     Ultrasound   Ultrasound Location B lumbar paraspinals   Ultrasound Parameters 1.5 w/cm2, 100%, 1 mhz x10 min   Ultrasound Goals Pain     Manual Therapy   Manual Therapy Soft tissue mobilization   Passive ROM STW to B lumbar paraspinals and QL in R sidelying to decrease pain and minimal tightness present with 2 pillows for comfort                  PT Short Term Goals - 07/09/16 1616      PT SHORT TERM GOAL #1   Title STG's=LTG's.           PT Long Term Goals - 07/09/16 1617      PT LONG TERM GOAL #1   Title Independent with a HEP.   Time 8   Period Weeks   Status New     PT LONG TERM GOAL #2   Title Stand 20 minutes with pain not > 3-4/10.   Time 8   Period Weeks   Status New     PT LONG TERM GOAL #3   Title Perform ADL's with pain not > 4/10.   Time 8   Status New               Plan - 07/25/16 1124    Clinical Impression Statement Patient presented in clinic with trunk flexion secondary to low back pain and scoliosis. Patient did not report any pain this treatment as she had not been doing a lot of activity per patient report. Minimal tightness noted along B lumbar paraspinals and QL with no reports of sensitivity or pain with manual therapy. Normal modalities respones noted following removal of the modalities.   Rehab Potential Good   PT Frequency 2x / week   PT Duration 6 weeks   PT Treatment/Interventions ADLs/Self Care Home Management;Electrical Stimulation;Moist Heat;Ultrasound;Therapeutic activities;Therapeutic exercise;Patient/family education;Manual techniques;Dry needling   PT Next Visit Plan STW/M; U/S; HMP and electrical stimulation and low level lumbar exercises.   Consulted and Agree with Plan of Care Patient      Patient will benefit from skilled therapeutic intervention in order to improve the following deficits and impairments:  Pain,  Decreased activity tolerance, Decreased range of motion  Visit Diagnosis: Chronic midline low back pain, with sciatica presence unspecified  Abnormal posture  Problem List Patient Active Problem List   Diagnosis Date Noted  . 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 Paroxysmal atrial fibrillation with rapid ventricular response ([place]) 09/28/2015  . Maxillary sinusitis, acute 08/27/2015  . Acute bronchitis 09/08/2014  . Mitral insufficiency 05/12/2014  . HTN (hypertension) 04/28/2014  . Lactic acidosis 09/07/2013  . Mitral valve prolapse 06/14/2013  . Chest pain, exertional 09/13/2012  . Pernicious anemia 09/07/2012  . DIVERTICULOSIS, COLON 09/06/2009  . Osteoporosis 09/06/2009  . Hyperlipidemia 09/04/2009  . PERIPHERAL NEUROPATHY 09/04/2009  . LOW BACK PAIN, CHRONIC 04/08/2007    Wynelle Fanny, PTA 07/25/2016, 11:28 AM  [facility] [place], [place], [address] Phone: [phone]   Fax:  [phone]  Name: Cassie White MRN: [id number] Date of Birth: 03/15/1941

## 2016-07-30 ENCOUNTER — Ambulatory Visit: Payer: Medicare Other | Admitting: *Deleted

## 2016-07-30 DIAGNOSIS — M545 Low back pain: Secondary | ICD-10-CM | POA: Diagnosis not present

## 2016-07-30 DIAGNOSIS — G8929 Other chronic pain: Secondary | ICD-10-CM

## 2016-07-30 DIAGNOSIS — R293 Abnormal posture: Secondary | ICD-10-CM

## 2016-07-30 NOTE — Patient Instructions (Signed)
Isometric Abdominal   Lying on back with knees bent, tighten stomach by pulling navel down. Hold ____ seconds. Repeat __5__ times per set. Do __5__ sets per session. Do _3-5___ sessions per day.  http://orth.exer.us/1086   Copyright  VHI. All rights reserved.  Bent Leg Lift (Hook-Lying)   Tighten stomach and slowly raise right leg _6___ inches from floor. Keep trunk rigid. Hold _2-3___ seconds. Repeat _10___ times per set. Do _3___ sets per session. Do _2-3___ sessions per day.  http://orth.exer.us/1090   Copyright  VHI. All rights reserved.  Bridging   Slowly raise buttocks from floor, keeping stomach tight. Repeat __10__ times per set. Do _3___ sets per session. Do __2-3__ sessions per day.  http://orth.exer.us/1096   Copyright  VHI. All rights reserved.   

## 2016-07-30 NOTE — Therapy (Signed)
[facility] [place], [place], [address] Phone: [phone]   Fax:  [phone]  Physical Therapy Treatment  Patient Details  Name: AIYA AZOULAY MRN: [id number] Date of Birth: 06-23-1941 Referring Provider: Rennis Harding MD  Encounter Date: 07/30/2016      PT End of Session - 07/30/16 1303    Visit Number 6   Number of Visits 16   Date for PT Re-Evaluation 09/07/16   PT Start Time 1300   PT Stop Time 1350   PT Time Calculation (min) 50 min   Activity Tolerance Patient tolerated treatment well   Behavior During Therapy [facility] for tasks assessed/performed      Past Medical History:  Diagnosis Date  . Arthritis    DJD, back and both hips  . Chronic low back pain without sciatica    Lumbar spondylosis + scoliosis.  Summer 2017 Dr. Nelva Bush did ESI and pt got no relief.  Pt then saw Dr. Maia Petties [facility]: felt she had facet mediated pain; plan for B L345 MBB (??).  Marland Kitchen Diverticulosis    on colonoscopy 2004  . GERD (gastroesophageal reflux disease)   . Hx of cardiovascular stress test    Lexiscan Myoview (2/14):  Low risk, small ant defect likely breast attenuation, no ischemia, EF 69% (no change from 2010).    . Hyperlipidemia   . Hypertension    per pt  . Idiopathic scoliosis of thoracolumbar region   . Mitral regurgitation    Echo (2/14):  EF 60-65%, Gr 1 DD, mild AI, mild bileaflet MVP, mod post directed MR, mild LAE, PASP 35.  Marland Kitchen MVP (mitral valve prolapse)   . Osteoporosis, senile    stable at the left hip 2014-2015.  Took fosamax for 10 yrs.  Plan: get repeat DEXA q34mo.  Marland Kitchen PAF (paroxysmal atrial fibrillation) (HCC)    Dr. [place]: Flecainide, metopr, apxiaban.  . Peripheral neuropathy ([place]) 2008   Idiopathic vs familial (motor>sensory): gabapentin helpful  . PONV (postoperative nausea and vomiting)   . Shoulder pain left   RC surg 09/2014    Past Surgical History:  Procedure Laterality Date  . BREAST  SURGERY  Nov 2010   Benign biopsy, right  . CHOLECYSTECTOMY  03/2010   Dr.Gross  . COLONOSCOPY  10/03/2002   No polyps.  Repeat 10 yrs (Dr. Olevia Perches)  . COMBINED HYSTERECTOMY VAGINAL / OOPHORECTOMY / A&P REPAIR  0000000   uncertain if ovaries were removed or not  . CYSTOCELE REPAIR    . Lumpectomy  06/2009   "Fatty Necrosis"  . PFT's  2014   NORMAL  . ROTATOR CUFF REPAIR Left 10/02/14   Dr. Veverly Fells  . TONSILLECTOMY    . TOTAL KNEE ARTHROPLASTY  09/2009   Right ;Dr Alvan Dame  . TOTAL KNEE ARTHROPLASTY Left 03/10/2016   Procedure: TOTAL KNEE ARTHROPLASTY;  Surgeon: Paralee Cancel, MD;  Location: [facility];  Service: Orthopedics;  Laterality: Left;  . TRANSTHORACIC ECHOCARDIOGRAM  09/2012   EF 60-65%, Gr 1 DD, mild AI, mild bileaflet MVP, mod post directed MR, mild LAE, PASP 35.  Marland Kitchen VAGINAL HYSTERECTOMY     For Uterine Deviation     There were no vitals filed for this visit.      Subjective Assessment - 07/30/16 1300    Subjective Reports that MD wants her to take shots in her back and that arthritis is the cause. Did not report any pain as she states she has been sitting.   Patient  is accompained by: Family member   Limitations Walking   How long can you sit comfortably? No limitation   How long can you stand comfortably? No limitation   How long can you walk comfortably? 15 minutes   Patient Stated Goals Get out of pain and walk better.   Currently in Pain? Yes   Pain Score 2    Pain Location Back   Pain Orientation Right;Left;Mid;Lower   Pain Descriptors / Indicators Aching   Pain Type Chronic pain                         [facility] Adult PT Treatment/Exercise - 07/30/16 0001      Exercises   Exercises Lumbar     Lumbar Exercises: Supine   Ab Set 20 reps;5 seconds  Drawins   Bent Knee Raise 20 reps;5 seconds     Modalities   Modalities Electrical Stimulation;Moist Heat;Ultrasound     Moist Heat Therapy   Number Minutes Moist Heat 15 Minutes   Moist Heat Location  Lumbar Spine     Electrical Stimulation   Electrical Stimulation Location B lumbar paraspinals  IFC 80-150hz  x 15 mins   Electrical Stimulation Goals Pain     Ultrasound   Ultrasound Location B lumbar paras   Ultrasound Parameters 1.5 w/cm2 x 10 mins   Ultrasound Goals Pain     Manual Therapy   Manual Therapy Soft tissue mobilization   Passive ROM STW to B lumbar paraspinals and QL in R sidelying to decrease pain and minimal tightness present with 2 pillows for comfort                  PT Short Term Goals - 07/09/16 1616      PT SHORT TERM GOAL #1   Title STG's=LTG's.           PT Long Term Goals - 07/09/16 1617      PT LONG TERM GOAL #1   Title Independent with a HEP.   Time 8   Period Weeks   Status New     PT LONG TERM GOAL #2   Title Stand 20 minutes with pain not > 3-4/10.   Time 8   Period Weeks   Status New     PT LONG TERM GOAL #3   Title Perform ADL's with pain not > 4/10.   Time 8   Status New               Plan - 07/30/16 1350    Clinical Impression Statement Pt arrived to clinic today doing a little better with mainly soreness in her LB. Her pain increases the more she does. We added core activation Exs today and added to HEP.    Rehab Potential Good   PT Frequency 2x / week   PT Duration 6 weeks   PT Treatment/Interventions ADLs/Self Care Home Management;Electrical Stimulation;Moist Heat;Ultrasound;Therapeutic activities;Therapeutic exercise;Patient/family education;Manual techniques;Dry needling   PT Next Visit Plan STW/M; U/S; HMP and electrical stimulation and low level lumbar exercises.   PT Home Exercise Plan step ups/downs, sit to stand and wall squats   Consulted and Agree with Plan of Care Patient      Patient will benefit from skilled therapeutic intervention in order to improve the following deficits and impairments:  Pain, Decreased activity tolerance, Decreased range of motion  Visit Diagnosis: Chronic midline  low back pain, with sciatica presence unspecified  Abnormal posture  Problem List Patient Active Problem List   Diagnosis Date Noted  . 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 Paroxysmal atrial fibrillation with rapid ventricular response ([place]) 09/28/2015  . Maxillary sinusitis, acute 08/27/2015  . Acute bronchitis 09/08/2014  . Mitral insufficiency 05/12/2014  . HTN (hypertension) 04/28/2014  . Lactic acidosis 09/07/2013  . Mitral valve prolapse 06/14/2013  . Chest pain, exertional 09/13/2012  . Pernicious anemia 09/07/2012  . DIVERTICULOSIS, COLON 09/06/2009  . Osteoporosis 09/06/2009  . Hyperlipidemia 09/04/2009  . PERIPHERAL NEUROPATHY 09/04/2009  . LOW BACK PAIN, CHRONIC 04/08/2007    RAMSEUR,CHRIS, PTA 07/30/2016, 2:08 PM  [facility] [place], [place], [address] Phone: [phone]   Fax:  [phone]  Name: JANESHA DADDIO MRN: [id number] Date of Birth: [birth year]

## 2016-08-01 ENCOUNTER — Telehealth: Payer: Self-pay | Admitting: Family Medicine

## 2016-08-01 NOTE — Telephone Encounter (Signed)
Patient refused appointment for AWV.  Just FYI.

## 2016-08-01 NOTE — Telephone Encounter (Signed)
Patient now has had a change in heart and called back to schedule AWV.

## 2016-08-01 NOTE — Telephone Encounter (Signed)
Form completed, signed and put up front for p/u. Pt advised and voiced understanding.

## 2016-08-01 NOTE — Telephone Encounter (Signed)
Patient would like handicap placard for car.  Please advise.

## 2016-08-01 NOTE — Telephone Encounter (Signed)
Noted  

## 2016-08-05 ENCOUNTER — Ambulatory Visit: Payer: Medicare Other | Admitting: *Deleted

## 2016-08-06 ENCOUNTER — Encounter: Payer: Self-pay | Admitting: Physical Therapy

## 2016-08-06 ENCOUNTER — Ambulatory Visit: Payer: Medicare Other | Attending: Orthopedic Surgery | Admitting: Physical Therapy

## 2016-08-06 DIAGNOSIS — G8929 Other chronic pain: Secondary | ICD-10-CM | POA: Diagnosis not present

## 2016-08-06 DIAGNOSIS — R293 Abnormal posture: Secondary | ICD-10-CM | POA: Diagnosis not present

## 2016-08-06 DIAGNOSIS — M545 Low back pain: Secondary | ICD-10-CM | POA: Diagnosis not present

## 2016-08-06 NOTE — Therapy (Addendum)
[facility] [place], [place], [address] Phone: [phone]   Fax:  [phone]  Physical Therapy Treatment  Patient Details  Name: Cassie White MRN: [id number] Date of Birth: [birth year] Referring Provider: Rennis Harding MD  Encounter Date: 08/06/2016      PT End of Session - 08/06/16 1051    Visit Number 7   Number of Visits 16   Date for PT Re-Evaluation 09/07/16   PT Start Time 1029   PT Stop Time 1116   PT Time Calculation (min) 47 min   Activity Tolerance Patient tolerated treatment well   Behavior During Therapy [facility] for tasks assessed/performed      Past Medical History:  Diagnosis Date  . Arthritis    DJD, back and both hips  . Chronic low back pain without sciatica    Lumbar spondylosis + scoliosis.  Summer 2017 Dr. Nelva Bush did ESI and pt got no relief.  Pt then saw Dr. Maia Petties with [facility]: felt she had facet mediated pain; plan for B L345 MBB (??).  Marland Kitchen Diverticulosis    on colonoscopy 2004  . GERD (gastroesophageal reflux disease)   . Hx of cardiovascular stress test    Lexiscan Myoview (2/14):  Low risk, small ant defect likely breast attenuation, no ischemia, EF 69% (no change from 2010).    . Hyperlipidemia   . Hypertension    per pt  . Idiopathic scoliosis of thoracolumbar region   . Mitral regurgitation    Echo (2/14):  EF 60-65%, Gr 1 DD, mild AI, mild bileaflet MVP, mod post directed MR, mild LAE, PASP 35.  Marland Kitchen MVP (mitral valve prolapse)   . Osteoporosis, senile    stable at the left hip 2014-2015.  Took fosamax for 10 yrs.  Plan: get repeat DEXA q41mo  .Marland KitchenPAF (paroxysmal atrial fibrillation) (HCC)    Dr. JMartinique Flecainide, metopr, apxiaban.  . [place] 2008   Idiopathic vs familial (motor>sensory): gabapentin helpful  . PONV (postoperative nausea and vomiting)   . Shoulder pain left   RC surg 09/2014    Past Surgical History:  Procedure Laterality Date  . BREAST  SURGERY  Nov 2010   Benign biopsy, right  . CHOLECYSTECTOMY  03/2010   Dr.Gross  . COLONOSCOPY  10/03/2002   No polyps.  Repeat 10 yrs (Dr. BOlevia Perches  . COMBINED HYSTERECTOMY VAGINAL / OOPHORECTOMY / A&P REPAIR  18416  uncertain if ovaries were removed or not  . CYSTOCELE REPAIR    . Lumpectomy  06/2009   "Fatty Necrosis"  . PFT's  2014   NORMAL  . ROTATOR CUFF REPAIR Left 10/02/14   Dr. NVeverly Fells . TONSILLECTOMY    . TOTAL KNEE ARTHROPLASTY  09/2009   Right ;Dr OAlvan Dame . TOTAL KNEE ARTHROPLASTY Left 03/10/2016   Procedure: TOTAL KNEE ARTHROPLASTY;  Surgeon: MParalee Cancel MD;  Location: [facility];  Service: Orthopedics;  Laterality: Left;  . TRANSTHORACIC ECHOCARDIOGRAM  09/2012   EF 60-65%, Gr 1 DD, mild AI, mild bileaflet MVP, mod post directed MR, mild LAE, PASP 35.  .Marland KitchenVAGINAL HYSTERECTOMY     For Uterine Deviation     There were no vitals filed for this visit.      Subjective Assessment - 08/06/16 1037    Subjective Patient has reported no relief thus far   Limitations Walking   How long can you sit comfortably? No limitation   How long can you stand comfortably? No limitation  How long can you walk comfortably? 15 minutes   Patient Stated Goals Get out of pain and walk better.   Currently in Pain? Yes   Pain Score 5    Pain Location Back   Pain Orientation Right;Left;Lower   Pain Descriptors / Indicators Aching   Pain Type Chronic pain   Pain Onset More than a month ago   Pain Frequency Constant   Aggravating Factors  prolong standing or activity   Pain Relieving Factors at rest                         [facility] Adult PT Treatment/Exercise - 08/06/16 0001      Electrical Stimulation   Electrical Stimulation Location B lumbar paraspinals  IFC 80-150hz x 15 mins   Electrical Stimulation Goals Pain     Ultrasound   Ultrasound Location bil lumbar paraspinals   Ultrasound Parameters 1.5w/cm2/50%/58mz x126m   Ultrasound Goals Pain     Manual Therapy    Manual Therapy Soft tissue mobilization   Passive ROM STW to B lumbar paraspinals and QL in R sidelying to decrease pain and minimal tightness present with 2 pillows for comfort                  PT Short Term Goals - 07/09/16 1616      PT SHORT TERM GOAL #1   Title STG's=LTG's.           PT Long Term Goals - 08/06/16 1052      PT LONG TERM GOAL #1   Title Independent with a HEP.   Time 8   Period Weeks   Status Achieved     PT LONG TERM GOAL #2   Title Stand 20 minutes with pain not > 3-4/10.   Time 8   Period Weeks   Status On-going     PT LONG TERM GOAL #3   Title Perform ADL's with pain not > 4/10.   Time 8   Period Weeks   Status On-going               Plan - 08/06/16 1052    Clinical Impression Statement Patient tolerated treatment well overall and reported feeling relief yet overall its temporary relief. Patient reported doing her HEP with no difficulty. Patient was educated today on posture awareness techniqes in all positions to help protect back from further pain or injury. Patient met LTG #1 yet others ongoing due to ongoing pain deficts.    Rehab Potential Good   PT Frequency 2x / week   PT Duration 6 weeks   PT Treatment/Interventions ADLs/Self Care Home Management;Electrical Stimulation;Moist Heat;Ultrasound;Therapeutic activities;Therapeutic exercise;Patient/family education;Manual techniques;Dry needling   PT Next Visit Plan on hold per patient due to getting shots in back and will call to reschedule   Consulted and Agree with Plan of Care Patient      Patient will benefit from skilled therapeutic intervention in order to improve the following deficits and impairments:  Pain, Decreased activity tolerance, Decreased range of motion  Visit Diagnosis: Chronic midline low back pain, with sciatica presence unspecified  Abnormal posture     Problem List Patient Active Problem List   Diagnosis Date Noted  . S/P left TKA 03/10/2016   . 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 [place]  . Maxillary sinusitis, acute 08/27/2015  . Acute bronchitis 09/08/2014  . Mitral insufficiency 05/12/2014  . HTN (hypertension)  04/28/2014  . Lactic acidosis 09/07/2013  . Mitral valve prolapse 06/14/2013  . Chest pain, exertional 09/13/2012  . Pernicious anemia 09/07/2012  . DIVERTICULOSIS, COLON 09/06/2009  . Osteoporosis 09/06/2009  . Hyperlipidemia 09/04/2009  . PERIPHERAL NEUROPATHY 09/04/2009  . LOW BACK PAIN, CHRONIC 04/08/2007    DUNFORD, CHRISTINA P, PTA 08/06/2016, 11:19 AM  [facility] [place], [place], [address] Phone: [phone]   Fax:  [phone]  Name: Cassie White MRN: [id number] Date of Birth: Apr 19, 1941  PHYSICAL THERAPY DISCHARGE SUMMARY  Visits from Start of Care: 7  Current functional level related to goals / functional outcomes: See above   Remaining deficits: No goals met   Education / Equipment: HEP  Plan: Patient agrees to discharge.  Patient goals were not met. Patient is being discharged due to lack of progress.  ?????         Mali Applegate MPT

## 2016-08-08 ENCOUNTER — Ambulatory Visit (INDEPENDENT_AMBULATORY_CARE_PROVIDER_SITE_OTHER): Payer: Medicare Other

## 2016-08-08 VITALS — BP 142/72 | HR 58 | Resp 18 | Ht 62.0 in | Wt 144.8 lb

## 2016-08-08 DIAGNOSIS — Z1239 Encounter for other screening for malignant neoplasm of breast: Secondary | ICD-10-CM

## 2016-08-08 DIAGNOSIS — Z1231 Encounter for screening mammogram for malignant neoplasm of breast: Secondary | ICD-10-CM

## 2016-08-08 DIAGNOSIS — E2839 Other primary ovarian failure: Secondary | ICD-10-CM | POA: Diagnosis not present

## 2016-08-08 DIAGNOSIS — Z0000 Encounter for general adult medical examination without abnormal findings: Secondary | ICD-10-CM

## 2016-08-08 NOTE — Patient Instructions (Addendum)
Continue to eat heart healthy diet (full of fruits, vegetables, whole grains, lean protein, water--limit salt, fat, and sugar intake) and increase physical activity as tolerated.  Continue doing brain stimulating activities (puzzles, reading, adult coloring books, staying active) to keep memory sharp.   Schedule mammogram and bone scan after 09/17/2016.   Complete cologuard testing.   Bring a copy of your advance directives to your next office visit once complete.      Fall Prevention in the Home Introduction Falls can cause injuries. They can happen to people of all ages. There are many things you can do to make your home safe and to help prevent falls. What can I do on the outside of my home?  Regularly fix the edges of walkways and driveways and fix any cracks.  Remove anything that might make you trip as you walk through a door, such as a raised step or threshold.  Trim any bushes or trees on the path to your home.  Use bright outdoor lighting.  Clear any walking paths of anything that might make someone trip, such as rocks or tools.  Regularly check to see if handrails are loose or broken. Make sure that both sides of any steps have handrails.  Any raised decks and porches should have guardrails on the edges.  Have any leaves, snow, or ice cleared regularly.  Use sand or salt on walking paths during winter.  Clean up any spills in your garage right away. This includes oil or grease spills. What can I do in the bathroom?  Use night lights.  Install grab bars by the toilet and in the tub and shower. Do not use towel bars as grab bars.  Use non-skid mats or decals in the tub or shower.  If you need to sit down in the shower, use a plastic, non-slip stool.  Keep the floor dry. Clean up any water that spills on the floor as soon as it happens.  Remove soap buildup in the tub or shower regularly.  Attach bath mats securely with double-sided non-slip rug tape.  Do  not have throw rugs and other things on the floor that can make you trip. What can I do in the bedroom?  Use night lights.  Make sure that you have a light by your bed that is easy to reach.  Do not use any sheets or blankets that are too big for your bed. They should not hang down onto the floor.  Have a firm chair that has side arms. You can use this for support while you get dressed.  Do not have throw rugs and other things on the floor that can make you trip. What can I do in the kitchen?  Clean up any spills right away.  Avoid walking on wet floors.  Keep items that you use a lot in easy-to-reach places.  If you need to reach something above you, use a strong step stool that has a grab bar.  Keep electrical cords out of the way.  Do not use floor polish or wax that makes floors slippery. If you must use wax, use non-skid floor wax.  Do not have throw rugs and other things on the floor that can make you trip. What can I do with my stairs?  Do not leave any items on the stairs.  Make sure that there are handrails on both sides of the stairs and use them. Fix handrails that are broken or loose. Make sure that handrails are  as long as the stairways.  Check any carpeting to make sure that it is firmly attached to the stairs. Fix any carpet that is loose or worn.  Avoid having throw rugs at the top or bottom of the stairs. If you do have throw rugs, attach them to the floor with carpet tape.  Make sure that you have a light switch at the top of the stairs and the bottom of the stairs. If you do not have them, ask someone to add them for you. What else can I do to help prevent falls?  Wear shoes that:  Do not have high heels.  Have rubber bottoms.  Are comfortable and fit you well.  Are closed at the toe. Do not wear sandals.  If you use a stepladder:  Make sure that it is fully opened. Do not climb a closed stepladder.  Make sure that both sides of the stepladder  are locked into place.  Ask someone to hold it for you, if possible.  Clearly mark and make sure that you can see:  Any grab bars or handrails.  First and last steps.  Where the edge of each step is.  Use tools that help you move around (mobility aids) if they are needed. These include:  Canes.  Walkers.  Scooters.  Crutches.  Turn on the lights when you go into a dark area. Replace any light bulbs as soon as they burn out.  Set up your furniture so you have a clear path. Avoid moving your furniture around.  If any of your floors are uneven, fix them.  If there are any pets around you, be aware of where they are.  Review your medicines with your doctor. Some medicines can make you feel dizzy. This can increase your chance of falling. Ask your doctor what other things that you can do to help prevent falls. This information is not intended to replace advice given to you by your health care provider. Make sure you discuss any questions you have with your health care provider. Document Released: 05/17/2009 Document Revised: 12/27/2015 Document Reviewed: 08/25/2014  2017 [employer]  Health Maintenance, Female Introduction Adopting a healthy lifestyle and getting preventive care can go a long way to promote health and wellness. Talk with your health care provider about what schedule of regular examinations is right for you. This is a good chance for you to check in with your provider about disease prevention and staying healthy. In between checkups, there are plenty of things you can do on your own. Experts have done a lot of research about which lifestyle changes and preventive measures are most likely to keep you healthy. Ask your health care provider for more information. Weight and diet Eat a healthy diet  Be sure to include plenty of vegetables, fruits, low-fat dairy products, and lean protein.  Do not eat a lot of foods high in solid fats, added sugars, or salt.  Get  regular exercise. This is one of the most important things you can do for your health.  Most adults should exercise for at least 150 minutes each week. The exercise should increase your heart rate and make you sweat (moderate-intensity exercise).  Most adults should also do strengthening exercises at least twice a week. This is in addition to the moderate-intensity exercise. Maintain a healthy weight  Body mass index (BMI) is a measurement that can be used to identify possible weight problems. It estimates body fat based on height and weight. Your health  care provider can help determine your BMI and help you achieve or maintain a healthy weight.  For females 41 years of age and older:  A BMI below 18.5 is considered underweight.  A BMI of 18.5 to 24.9 is normal.  A BMI of 25 to 29.9 is considered overweight.  A BMI of 30 and above is considered obese. Watch levels of cholesterol and blood lipids  You should start having your blood tested for lipids and cholesterol at 76 years of age, then have this test every 5 years.  You may need to have your cholesterol levels checked more often if:  Your lipid or cholesterol levels are high.  You are older than 76 years of age.  You are at high risk for heart disease. Cancer screening Lung Cancer  Lung cancer screening is recommended for adults 29-13 years old who are at high risk for lung cancer because of a history of smoking.  A yearly low-dose CT scan of the lungs is recommended for people who:  Currently smoke.  Have quit within the past 15 years.  Have at least a 30-pack-year history of smoking. A pack year is smoking an average of one pack of cigarettes a day for 1 year.  Yearly screening should continue until it has been 15 years since you quit.  Yearly screening should stop if you develop a health problem that would prevent you from having lung cancer treatment. Breast Cancer  Practice breast self-awareness. This means  understanding how your breasts normally appear and feel.  It also means doing regular breast self-exams. Let your health care provider know about any changes, no matter how small.  If you are in your 20s or 30s, you should have a clinical breast exam (CBE) by a health care provider every 1-3 years as part of a regular health exam.  If you are 52 or older, have a CBE every year. Also consider having a breast X-ray (mammogram) every year.  If you have a family history of breast cancer, talk to your health care provider about genetic screening.  If you are at high risk for breast cancer, talk to your health care provider about having an MRI and a mammogram every year.  Breast cancer gene (BRCA) assessment is recommended for women who have family members with BRCA-related cancers. BRCA-related cancers include:  Breast.  Ovarian.  Tubal.  Peritoneal cancers.  Results of the assessment will determine the need for genetic counseling and BRCA1 and BRCA2 testing. Cervical Cancer  Your health care provider may recommend that you be screened regularly for cancer of the pelvic organs (ovaries, uterus, and vagina). This screening involves a pelvic examination, including checking for microscopic changes to the surface of your cervix (Pap test). You may be encouraged to have this screening done every 3 years, beginning at age 50.  For women ages 28-65, health care providers may recommend pelvic exams and Pap testing every 3 years, or they may recommend the Pap and pelvic exam, combined with testing for human papilloma virus (HPV), every 5 years. Some types of HPV increase your risk of cervical cancer. Testing for HPV may also be done on women of any age with unclear Pap test results.  Other health care providers may not recommend any screening for nonpregnant women who are considered low risk for pelvic cancer and who do not have symptoms. Ask your health care provider if a screening pelvic exam is  right for you.  If you have had past treatment  for cervical cancer or a condition that could lead to cancer, you need Pap tests and screening for cancer for at least 20 years after your treatment. If Pap tests have been discontinued, your risk factors (such as having a new sexual partner) need to be reassessed to determine if screening should resume. Some women have medical problems that increase the chance of getting cervical cancer. In these cases, your health care provider may recommend more frequent screening and Pap tests. Colorectal Cancer  This type of cancer can be detected and often prevented.  Routine colorectal cancer screening usually begins at 76 years of age and continues through 76 years of age.  Your health care provider may recommend screening at an earlier age if you have risk factors for colon cancer.  Your health care provider may also recommend using home test kits to check for hidden blood in the stool.  A small camera at the end of a tube can be used to examine your colon directly (sigmoidoscopy or colonoscopy). This is done to check for the earliest forms of colorectal cancer.  Routine screening usually begins at age 72.  Direct examination of the colon should be repeated every 5-10 years through 76 years of age. However, you may need to be screened more often if early forms of precancerous polyps or small growths are found. Skin Cancer  Check your skin from head to toe regularly.  Tell your health care provider about any new moles or changes in moles, especially if there is a change in a mole's shape or color.  Also tell your health care provider if you have a mole that is larger than the size of a pencil eraser.  Always use sunscreen. Apply sunscreen liberally and repeatedly throughout the day.  Protect yourself by wearing long sleeves, pants, a wide-brimmed hat, and sunglasses whenever you are outside. Heart disease, diabetes, and high blood pressure  High  blood pressure causes heart disease and increases the risk of stroke. High blood pressure is more likely to develop in:  People who have blood pressure in the high end of the normal range (130-139/85-89 mm Hg).  People who are overweight or obese.  People who are African American.  If you are 93-42 years of age, have your blood pressure checked every 3-5 years. If you are 59 years of age or older, have your blood pressure checked every year. You should have your blood pressure measured twice-once when you are at a hospital or clinic, and once when you are not at a hospital or clinic. Record the average of the two measurements. To check your blood pressure when you are not at a hospital or clinic, you can use:  An automated blood pressure machine at a pharmacy.  A home blood pressure monitor.  If you are between 21 years and 76 years old, ask your health care provider if you should take aspirin to prevent strokes.  Have regular diabetes screenings. This involves taking a blood sample to check your fasting blood sugar level.  If you are at a normal weight and have a low risk for diabetes, have this test once every three years after 76 years of age.  If you are overweight and have a high risk for diabetes, consider being tested at a younger age or more often. Preventing infection Hepatitis B  If you have a higher risk for hepatitis B, you should be screened for this virus. You are considered at high risk for hepatitis B if:  You were born in a country where hepatitis B is common. Ask your health care provider which countries are considered high risk.  Your parents were born in a high-risk country, and you have not been immunized against hepatitis B (hepatitis B vaccine).  You have HIV or AIDS.  You use needles to inject street drugs.  You live with someone who has hepatitis B.  You have had sex with someone who has hepatitis B.  You get hemodialysis treatment.  You take certain  medicines for conditions, including cancer, organ transplantation, and autoimmune conditions. Hepatitis C  Blood testing is recommended for:  Everyone born from [birth year] through 1965.  Anyone with known risk factors for hepatitis C. Sexually transmitted infections (STIs)  You should be screened for sexually transmitted infections (STIs) including gonorrhea and chlamydia if:  You are sexually active and are younger than 76 years of age.  You are older than 76 years of age and your health care provider tells you that you are at risk for this type of infection.  Your sexual activity has changed since you were last screened and you are at an increased risk for chlamydia or gonorrhea. Ask your health care provider if you are at risk.  If you do not have HIV, but are at risk, it may be recommended that you take a prescription medicine daily to prevent HIV infection. This is called pre-exposure prophylaxis (PrEP). You are considered at risk if:  You are sexually active and do not regularly use condoms or know the HIV status of your partner(s).  You take drugs by injection.  You are sexually active with a partner who has HIV. Talk with your health care provider about whether you are at high risk of being infected with HIV. If you choose to begin PrEP, you should first be tested for HIV. You should then be tested every 3 months for as long as you are taking PrEP. Pregnancy  If you are premenopausal and you may become pregnant, ask your health care provider about preconception counseling.  If you may become pregnant, take 400 to 800 micrograms (mcg) of folic acid every day.  If you want to prevent pregnancy, talk to your health care provider about birth control (contraception). Osteoporosis and menopause  Osteoporosis is a disease in which the bones lose minerals and strength with aging. This can result in serious bone fractures. Your risk for osteoporosis can be identified using a bone density  scan.  If you are 70 years of age or older, or if you are at risk for osteoporosis and fractures, ask your health care provider if you should be screened.  Ask your health care provider whether you should take a calcium or vitamin D supplement to lower your risk for osteoporosis.  Menopause may have certain physical symptoms and risks.  Hormone replacement therapy may reduce some of these symptoms and risks. Talk to your health care provider about whether hormone replacement therapy is right for you. Follow these instructions at home:  Schedule regular health, dental, and eye exams.  Stay current with your immunizations.  Do not use any tobacco products including cigarettes, chewing tobacco, or electronic cigarettes.  If you are pregnant, do not drink alcohol.  If you are breastfeeding, limit how much and how often you drink alcohol.  Limit alcohol intake to no more than 1 drink per day for nonpregnant women. One drink equals 12 ounces of beer, 5 ounces of wine, or 1 ounces of hard liquor.  Do not use street drugs.  Do not share needles.  Ask your health care provider for help if you need support or information about quitting drugs.  Tell your health care provider if you often feel depressed.  Tell your health care provider if you have ever been abused or do not feel safe at home. This information is not intended to replace advice given to you by your health care provider. Make sure you discuss any questions you have with your health care provider. Document Released: 02/03/2011 Document Revised: 12/27/2015 Document Reviewed: 04/24/2015  2017 [employer]

## 2016-08-08 NOTE — Progress Notes (Signed)
Subjective:   Cassie White is a 76 y.o. female who presents for Medicare Annual (Subsequent) preventive examination accompanied by her sister-in-law, Jeanett Schlein.   The Patient was informed that the wellness visit is to identify future health risk and educate and initiate measures that can reduce risk for increased disease through the lifespan.   Review of Systems:  No ROS.  Medicare Wellness Visit.  Cardiac Risk Factors include: advanced age (>18men, >69 women);dyslipidemia;hypertension   Sleep patterns: sleeps 6-7 hours, up to void x 1.   Home Safety/Smoke Alarms:  Smoke detectors and security in place.  Living environment; residence and Firearm Safety: Lives alone in single story home. Feels safe in home. Firearm safety discussed.  Seat Belt Safety/Bike Helmet: Wears seat belt.   Counseling:   Eye Exam-Last exam 02/2016, every 2 years by "[facility]" in [place].   Dental-Last exam 08/2015, appointment scheduled for this month. Yearly by Dr. Berdine Addison in [place]  Female:   Pap-N/A      Mammo-09/18/2015, negative. Order placed.          Dexa scan-05/25/2014, osteoporosis. Calcium and Vitamin D. Order placed.       CCS-colonoscopy 10/03/2002, diverticulosis. Recall 10 years. Request Cologuard testing, ordered.       Objective:     Vitals: BP (!) 142/72 (BP Location: Left Arm, Patient Position: Sitting, Cuff Size: Normal)   Pulse (!) 58   Resp 18   Ht 5\' 2"  (1.575 m)   Wt 144 lb 12.8 oz (65.7 kg)   SpO2 98%   BMI 26.48 kg/m   Body mass index is 26.48 kg/m.   Tobacco History  Smoking Status  . Never Smoker  Smokeless Tobacco  . Never Used     Counseling given: Not Answered   Past Medical History:  Diagnosis Date  . Arthritis    DJD, back and both hips  . Chronic low back pain without sciatica    Lumbar spondylosis + scoliosis.  Summer 2017 Dr. Nelva Bush did ESI and pt got no relief.  Pt then saw Dr. Maia [facility]: felt she had facet mediated pain;  plan for B L345 MBB (??).  Marland Kitchen Diverticulosis    on colonoscopy 2004  . GERD (gastroesophageal reflux disease)   . Hx of cardiovascular stress test    Lexiscan Myoview (2/14):  Low risk, small ant defect likely breast attenuation, no ischemia, EF 69% (no change from 2010).    . Hyperlipidemia   . Hypertension    per pt  . Idiopathic scoliosis of thoracolumbar region   . Mitral regurgitation    Echo (2/14):  EF 60-65%, Gr 1 DD, mild AI, mild bileaflet MVP, mod post directed MR, mild LAE, PASP 35.  Marland Kitchen MVP (mitral valve prolapse)   . Osteoporosis, senile    stable at the left hip 2014-2015.  Took fosamax for 10 yrs.  Plan: get repeat DEXA q75mo.  Marland Kitchen PAF (paroxysmal atrial fibrillation) (HCC)    Dr. Martinique: Flecainide, metopr, apxiaban.  . Peripheral neuropathy ([place]) 2008   Idiopathic vs familial (motor>sensory): gabapentin helpful  . PONV (postoperative nausea and vomiting)   . Shoulder pain left   RC surg 09/2014   Past Surgical History:  Procedure Laterality Date  . BREAST SURGERY  Nov 2010   Benign biopsy, right  . CHOLECYSTECTOMY  03/2010   Dr.Gross  . COLONOSCOPY  10/03/2002   No polyps.  Repeat 10 yrs (Dr. Olevia Perches)  . COMBINED HYSTERECTOMY VAGINAL / OOPHORECTOMY / A&P REPAIR  0000000   uncertain if ovaries were removed or not  . CYSTOCELE REPAIR    . Lumpectomy  06/2009   "Fatty Necrosis"  . PFT's  2014   NORMAL  . ROTATOR CUFF REPAIR Left 10/02/14   Dr. Veverly Fells  . TONSILLECTOMY    . TOTAL KNEE ARTHROPLASTY  09/2009   Right ;Dr Alvan Dame  . TOTAL KNEE ARTHROPLASTY Left 03/10/2016   Procedure: TOTAL KNEE ARTHROPLASTY;  Surgeon: Paralee Cancel, MD;  Location: [facility];  Service: Orthopedics;  Laterality: Left;  . TRANSTHORACIC ECHOCARDIOGRAM  09/2012   EF 60-65%, Gr 1 DD, mild AI, mild bileaflet MVP, mod post directed MR, mild LAE, PASP 35.  Marland Kitchen VAGINAL HYSTERECTOMY     For Uterine Deviation    Family History  Problem Relation Age of Onset  . Heart attack Father     in 46s  . Stroke  Father 65  . Ovarian cancer Mother   . Heart attack Son 28    Smoker  . Coronary artery disease Brother   . Diabetes Neg Hx    History  Sexual Activity  . Sexual activity: No    Outpatient Encounter Prescriptions as of 08/08/2016  Medication Sig  . apixaban (ELIQUIS) 5 MG TABS tablet Take 1 tablet (5 mg total) by mouth 2 (two) times daily.  . Biotin 5000 MCG CAPS Take 5,000 mcg by mouth daily.  . Calcium Carbonate (CALCIUM 600 PO) Take 600 mg by mouth 2 (two) times daily.   . cholecalciferol (VITAMIN D) 1000 UNITS tablet Take 5,000 Units by mouth daily.   . cyanocobalamin (,VITAMIN B-12,) 1000 MCG/ML injection Inject 1 mL (1,000 mcg total) into the muscle every 30 (thirty) days.  Marland Kitchen docusate sodium (COLACE) 100 MG capsule Take 1 capsule (100 mg total) by mouth 2 (two) times daily.  . flecainide (TAMBOCOR) 50 MG tablet Take 1 tablet (50 mg total) by mouth 2 (two) times daily.  Marland Kitchen gabapentin (NEURONTIN) 300 MG capsule TAKE 1 CAPSULE BY MOUTH AT LUNCH, 1 AT DINNER, AND 2 AT BEDTIME (Patient taking differently: Take 300 mg by mouth 3 (three) times daily. TAKE 1 CAPSULE BY MOUTH AT LUNCH, 1 AT DINNER, AND 2 AT BEDTIME)  . HYDROcodone-acetaminophen (NORCO) 7.5-325 MG tablet Take 1-2 tablets by mouth every 4 (four) hours as needed for moderate pain.  . metoprolol tartrate (LOPRESSOR) 25 MG tablet Take 0.5 tablets (12.5 mg total) by mouth 2 (two) times daily.  . nitroGLYCERIN (NITROSTAT) 0.4 MG SL tablet Place 1 tablet (0.4 mg total) under the tongue every 5 (five) minutes as needed for chest pain.  . Omega-3 Fatty Acids (FISH OIL) 1200 MG CAPS Take 1,000 mg by mouth 2 (two) times daily.   . polyethylene glycol (MIRALAX / GLYCOLAX) packet Take 17 g by mouth 2 (two) times daily.  . pravastatin (PRAVACHOL) 20 MG tablet TAKE 1 TABLET BY MOUTH MON,WED,FRI   No facility-administered encounter medications on file as of 08/08/2016.     Activities of Daily Living In your present state of health, do you  have any difficulty performing the following activities: 08/08/2016 03/10/2016  Hearing? N N  Vision? N N  Difficulty concentrating or making decisions? N N  Walking or climbing stairs? N Y  Dressing or bathing? N N  Doing errands, shopping? N N  Preparing Food and eating ? N -  Using the Toilet? N -  In the past six months, have you accidently leaked urine? N -  Do you have problems with loss  of bowel control? N -  Managing your Medications? N -  Managing your Finances? N -  Housekeeping or managing your Housekeeping? N -  Some recent data might be hidden    Patient Care Team: Tammi Sou, MD as PCP - General (Family Medicine) Peter M Martinique, MD as Consulting Physician (Cardiology) Larey Seat, MD as Consulting Physician (Neurology) Suella Broad, MD as Consulting Physician (Physical Medicine and Rehabilitation) Zonia Kief, MD as Consulting Physician (Rehabilitation)    Assessment:    Physical assessment deferred to PCP.  Exercise Activities and Dietary recommendations Current Exercise Habits: The patient does not participate in regular exercise at present (housekeeping, walks to mailbox), Exercise limited by: orthopedic condition(s) (hip and back pain)   Diet (meal preparation, eat out, water intake, caffeinated beverages, dairy products, fruits and vegetables):  Drinks water and soda (20 oz).  Breakfast: Toast, peanut butter, honey, orange juice Lunch: sandwich Dinner: vegetables, some meat.   Discussed healthy food options and increasing water intake. Encouraged to increase activity and exercise as tolerated after back injections.   Goals      Patient Stated   . <enter goal here> (pt-stated)          "to be able to walk better". Plans to receive injections in back then become more active.       Fall Risk Fall Risk  08/08/2016 07/10/2016 06/22/2015 08/07/2014 09/07/2012  Falls in the past year? No No No No No   Depression Screen PHQ 2/9 Scores 08/08/2016  06/22/2015 08/07/2014 09/07/2012  PHQ - 2 Score 0 0 0 1     Cognitive Function MMSE - Mini Mental State Exam 08/08/2016  Orientation to time 5  Orientation to Place 5  Registration 3  Attention/ Calculation 5  Recall 2  Language- name 2 objects 2  Language- repeat 1  Language- follow 3 step command 3  Language- read & follow direction 1  Write a sentence 1  Copy design 1  Total score 29        Immunization History  Administered Date(s) Administered  . Influenza Whole 05/04/2012  . Influenza, High Dose Seasonal PF 05/12/2013, 04/28/2014, 05/28/2015, 04/28/2016  . Pneumococcal Conjugate-13 12/25/2014  . Pneumococcal Polysaccharide-23 09/07/2012  . Td 02/14/2010   Screening Tests Health Maintenance  Topic Date Due  . ZOSTAVAX  04/10/2001  . COLONOSCOPY  10/02/2012  . TETANUS/TDAP  02/15/2020  . INFLUENZA VACCINE  Completed  . DEXA SCAN  Completed  . PNA vac Low Risk Adult  Completed      Plan:     Eat heart healthy diet (full of fruits, vegetables, whole grains, lean protein, water--limit salt, fat, and sugar intake) and increase physical activity as tolerated.  Continue doing brain stimulating activities (puzzles, reading, adult coloring books, staying active) to keep memory sharp.   Schedule mammogram and bone scan after 09/17/2016.   Complete cologuard testing.   Bring a copy of your advance directives to your next office visit once complete.    During the course of the visit the patient was educated and counseled about the following appropriate screening and preventive services:   Vaccines to include Pneumoccal, Influenza, Hepatitis B, Td, Zostavax, HCV  Cardiovascular Disease  Colorectal cancer screening  Bone density screening  Diabetes screening  Glaucoma screening  Mammography/PAP  Nutrition counseling   Patient Instructions (the written plan) was given to the patient.   Gerilyn Nestle, RN  08/08/2016

## 2016-08-08 NOTE — Progress Notes (Signed)
Pre visit review using our clinic review tool, if applicable. No additional management support is needed unless otherwise documented below in the visit note. 

## 2016-08-09 NOTE — Progress Notes (Signed)
AWV reviewed and agree.  Signed:  Phil [name], MD           08/09/2016  

## 2016-08-29 ENCOUNTER — Encounter: Payer: Self-pay | Admitting: Family Medicine

## 2016-08-29 ENCOUNTER — Ambulatory Visit (INDEPENDENT_AMBULATORY_CARE_PROVIDER_SITE_OTHER): Payer: Medicare Other | Admitting: Family Medicine

## 2016-08-29 VITALS — BP 135/73 | HR 104 | Temp 98.3°F | Resp 16 | Ht 61.0 in | Wt 144.2 lb

## 2016-08-29 DIAGNOSIS — E7800 Pure hypercholesterolemia, unspecified: Secondary | ICD-10-CM | POA: Diagnosis not present

## 2016-08-29 DIAGNOSIS — D649 Anemia, unspecified: Secondary | ICD-10-CM

## 2016-08-29 DIAGNOSIS — Z1211 Encounter for screening for malignant neoplasm of colon: Secondary | ICD-10-CM

## 2016-08-29 DIAGNOSIS — I480 Paroxysmal atrial fibrillation: Secondary | ICD-10-CM

## 2016-08-29 DIAGNOSIS — I10 Essential (primary) hypertension: Secondary | ICD-10-CM

## 2016-08-29 DIAGNOSIS — I4891 Unspecified atrial fibrillation: Secondary | ICD-10-CM

## 2016-08-29 LAB — LIPID PANEL
Cholesterol: 222 mg/dL — ABNORMAL HIGH (ref 0–200)
HDL: 82.5 mg/dL (ref >39.00)
LDL Cholesterol: 117 mg/dL — ABNORMAL HIGH (ref 0–99)
NONHDL: 139.6
Total CHOL/HDL Ratio: 3
Triglycerides: 115 mg/dL (ref 0.0–149.0)
VLDL: 23 mg/dL (ref 0.0–40.0)

## 2016-08-29 LAB — CBC WITH DIFFERENTIAL/PLATELET
BASOS ABS: 0 10*3/uL (ref 0.0–0.1)
Basophils Relative: 0.6 % (ref 0.0–3.0)
EOS ABS: 0.1 10*3/uL (ref 0.0–0.7)
Eosinophils Relative: 1 % (ref 0.0–5.0)
HCT: 40.9 % (ref 36.0–46.0)
HEMOGLOBIN: 13.7 g/dL (ref 12.0–15.0)
LYMPHS PCT: 27.2 % (ref 12.0–46.0)
Lymphs Abs: 1.6 10*3/uL (ref 0.7–4.0)
MCHC: 33.6 g/dL (ref 30.0–36.0)
MCV: 90.4 fl (ref 78.0–100.0)
MONO ABS: 0.7 10*3/uL (ref 0.1–1.0)
Monocytes Relative: 11.8 % (ref 3.0–12.0)
Neutro Abs: 3.5 10*3/uL (ref 1.4–7.7)
Neutrophils Relative %: 59.4 % (ref 43.0–77.0)
Platelets: 198 10*3/uL (ref 150.0–400.0)
RBC: 4.52 Mil/uL (ref 3.87–5.11)
RDW: 14.5 % (ref 11.5–15.5)
WBC: 5.8 10*3/uL (ref 4.0–10.5)

## 2016-08-29 LAB — BASIC METABOLIC PANEL
BUN: 13 mg/dL (ref 6–23)
CALCIUM: 10.5 mg/dL (ref 8.4–10.5)
CO2: 30 mEq/L (ref 19–32)
Chloride: 106 mEq/L (ref 96–112)
Creatinine, Ser: 0.81 mg/dL (ref 0.40–1.20)
GFR: 73.19 mL/min (ref >60.00)
GLUCOSE: 94 mg/dL (ref 70–99)
POTASSIUM: 4.9 meq/L (ref 3.5–5.1)
Sodium: 141 mEq/L (ref 135–145)

## 2016-08-29 MED ORDER — METOPROLOL TARTRATE 25 MG PO TABS: 25.0000 mg | ORAL_TABLET | Freq: Two times a day (BID) | ORAL | 3 refills | Status: DC

## 2016-08-29 NOTE — Patient Instructions (Signed)
Slowly ween yourself off of coffee and any other caffienated beverages. This should help decrease your tremulousness and your periods of palpitations.

## 2016-08-29 NOTE — Progress Notes (Signed)
Pre visit review using our clinic review tool, if applicable. No additional management support is needed unless otherwise documented below in the visit note. 

## 2016-08-29 NOTE — Progress Notes (Signed)
OFFICE VISIT  08/29/2016   CC:  Chief Complaint  Patient presents with  . Follow-up    RCI, pt is fasting.   HPI:    Patient is a 76 y.o. Caucasian female who presents for 6 mo f/u HTN, hyperlipidemia, and paroxysmal atrial fibrillation.    No home bp monitoring but has cuff to do this if needed.  Compliant with lopressor. She does feel palpitations several times a week that lasts < 1 min.  These periods bother her.  No dizziness or chest pain.  She does drink 3-5 cups of coffee per day.  Has bilat hand tremulousness that is likely connected to her caffeine intake.  HLD: takes pravastatin 3 days a week.  She doesn't recall daily ingestion causing any side effects.  Past Medical History:  Diagnosis Date  . Arthritis    DJD, back and both hips  . Chronic low back pain without sciatica    Lumbar spondylosis + scoliosis.  Summer 2017 Dr. Nelva Bush did ESI and pt got no relief.  Pt then saw Dr. Maia Petties [facility]: felt she had facet mediated pain; plan for B L345 MBB (??).  Marland Kitchen Diverticulosis    on colonoscopy 2004  . GERD (gastroesophageal reflux disease)   . Hx of cardiovascular stress test    Lexiscan Myoview (2/14):  Low risk, small ant defect likely breast attenuation, no ischemia, EF 69% (no change from 2010).    . Hyperlipidemia   . Hypertension    per pt  . Idiopathic scoliosis of thoracolumbar region   . Mitral regurgitation    Echo (2/14):  EF 60-65%, Gr 1 DD, mild AI, mild bileaflet MVP, mod post directed MR, mild LAE, PASP 35.  Marland Kitchen MVP (mitral valve prolapse)   . Osteoporosis, senile    stable at the left hip 2014-2015.  Took fosamax for 10 yrs.  Plan: get repeat DEXA q20mo.  Marland Kitchen PAF (paroxysmal atrial fibrillation) (HCC)    Dr. Martinique: Flecainide, metopr, apxiaban.  . Peripheral neuropathy ([place]) 2008   Idiopathic vs familial (motor>sensory): gabapentin helpful  . Shoulder pain left   [facility] surg 09/2014    Past Surgical History:  Procedure Laterality Date  .  BREAST SURGERY  Nov 2010   Benign biopsy, right  . CHOLECYSTECTOMY  03/2010   Dr.Gross  . COLONOSCOPY  10/03/2002   No polyps.  Repeat 10 yrs (Dr. Olevia Perches)  . COMBINED HYSTERECTOMY VAGINAL / OOPHORECTOMY / A&P REPAIR  0000000   uncertain if ovaries were removed or not  . CYSTOCELE REPAIR    . Lumpectomy  06/2009   "Fatty Necrosis"  . PFT's  2014   NORMAL  . ROTATOR CUFF REPAIR Left 10/02/14   Dr. Veverly Fells  . TONSILLECTOMY    . TOTAL KNEE ARTHROPLASTY  09/2009   Right ;Dr Alvan Dame  . TOTAL KNEE ARTHROPLASTY Left 03/10/2016   Procedure: TOTAL KNEE ARTHROPLASTY;  Surgeon: Paralee Cancel, MD;  Location: [facility];  Service: Orthopedics;  Laterality: Left;  . TRANSTHORACIC ECHOCARDIOGRAM  09/2012; 09/27/15   2014: EF 60-65%, Gr 1 DD, mild AI, mild bileaflet MVP, mod post directed MR, mild LAE, PASP 35.  Repeat 2017: EF 55-60%, mild AR, mild MVP and mild MV regurg.  Marland Kitchen VAGINAL HYSTERECTOMY     For Uterine Deviation     Outpatient Medications Prior to Visit  Medication Sig Dispense Refill  . apixaban (ELIQUIS) 5 MG TABS tablet Take 1 tablet (5 mg total) by mouth 2 (two) times daily. 60 tablet  11  . Biotin 5000 MCG CAPS Take 5,000 mcg by mouth daily.    . Calcium Carbonate (CALCIUM 600 PO) Take 600 mg by mouth 2 (two) times daily.     . cholecalciferol (VITAMIN D) 1000 UNITS tablet Take 5,000 Units by mouth daily.     . cyanocobalamin (,VITAMIN B-12,) 1000 MCG/ML injection Inject 1 mL (1,000 mcg total) into the muscle every 30 (thirty) days. 10 mL 1  . docusate sodium (COLACE) 100 MG capsule Take 1 capsule (100 mg total) by mouth 2 (two) times daily. 10 capsule 0  . flecainide (TAMBOCOR) 50 MG tablet Take 1 tablet (50 mg total) by mouth 2 (two) times daily. 60 tablet 11  . gabapentin (NEURONTIN) 300 MG capsule TAKE 1 CAPSULE BY MOUTH AT LUNCH, 1 AT DINNER, AND 2 AT BEDTIME (Patient taking differently: Take 300 mg by mouth 3 (three) times daily. TAKE 1 CAPSULE BY MOUTH AT LUNCH, 1 AT DINNER, AND 2 AT BEDTIME) 360  capsule 3  . HYDROcodone-acetaminophen (NORCO) 7.5-325 MG tablet Take 1-2 tablets by mouth every 4 (four) hours as needed for moderate pain. 100 tablet 0  . nitroGLYCERIN (NITROSTAT) 0.4 MG SL tablet Place 1 tablet (0.4 mg total) under the tongue every 5 (five) minutes as needed for chest pain. 25 tablet 5  . Omega-3 Fatty Acids (FISH OIL) 1200 MG CAPS Take 1,000 mg by mouth 2 (two) times daily.     . polyethylene glycol (MIRALAX / GLYCOLAX) packet Take 17 g by mouth 2 (two) times daily. 14 each 0  . pravastatin (PRAVACHOL) 20 MG tablet TAKE 1 TABLET BY MOUTH MON,WED,FRI 90 tablet 3  . metoprolol tartrate (LOPRESSOR) 25 MG tablet Take 0.5 tablets (12.5 mg total) by mouth 2 (two) times daily. 45 tablet 3   No facility-administered medications prior to visit.     Allergies  Allergen Reactions  . Sulfa Antibiotics Swelling    face  . Tizanidine Other (See Comments)    delirium    ROS As per HPI  PE: Blood pressure 135/73, pulse (!) 104, temperature 98.3 F (36.8 C), temperature source Oral, resp. rate 16, height 5\' 1"  (1.549 m), weight 144 lb 4 oz (65.4 kg), SpO2 97 %. Gen: Alert, well appearing.  Patient is oriented to person, place, time, and situation. AFFECT: pleasant, lucid thought and speech. CV: irreg irreg, rate about 100, no audible m/r. Chest is clear, no wheezing or rales. Normal symmetric air entry throughout both lung fields. No chest wall deformities or tenderness. EXT: no clubbing, cyanosis, or edema.  Mild bilat tremor in hands when she holds arms outstretched. No pill rolling tremor.  LABS:  Lab Results  Component Value Date   TSH 1.63 09/24/2015   Lab Results  Component Value Date   WBC 10.3 03/11/2016   HGB 11.2 (L) 03/11/2016   HCT 33.9 (L) 03/11/2016   MCV 90.4 03/11/2016   PLT 167 03/11/2016   Lab Results  Component Value Date   CREATININE 0.71 03/11/2016   BUN 10 03/11/2016   NA 139 03/11/2016   K 4.4 03/11/2016   CL 110 03/11/2016   CO2 23  03/11/2016   Lab Results  Component Value Date   ALT 13 07/03/2015   AST 16 07/03/2015   ALKPHOS 66 07/03/2015   BILITOT 0.7 07/03/2015   Lab Results  Component Value Date   CHOL 220 (H) 07/03/2015   Lab Results  Component Value Date   HDL 92.40 07/03/2015   Lab Results  Component Value Date   LDLCALC 109 (H) 07/03/2015   Lab Results  Component Value Date   TRIG 94.0 07/03/2015   Lab Results  Component Value Date   CHOLHDL 2 07/03/2015    IMPRESSION AND PLAN:  1) HTN; The current medical regimen is effective;  continue present plan and medications. Lytes/cr today.  2) HLD: tolerating statin three days a week.  Recheck FLP today and if not good LDL then we'll increase her dosing frequency to DAILY.  3) PAF: having bothersome palpitations several times a week, although these are brief and not accompanied by any other symptoms.  Resting HR 100. Will increase lopressor to 25mg  bid.  Continue flecainide and eliquis. Instructions: Slowly ween yourself off of coffee and any other caffienated beverages. This should help decrease your tremulousness and your periods of palpitations.  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  See #3 above.  5) Colon ca screening: referred to [facility] for repeat colonoscopy: last was 2004.  An After Visit Summary was printed and given to the patient.  FOLLOW UP: Return in about 6 months (around 02/26/2017) for routine chronic illness f/u.  Signed:  Crissie Sickles, MD           08/29/2016

## 2016-09-01 ENCOUNTER — Encounter: Payer: Self-pay | Admitting: Family Medicine

## 2016-09-01 ENCOUNTER — Other Ambulatory Visit: Payer: Self-pay | Admitting: *Deleted

## 2016-09-01 MED ORDER — PRAVASTATIN SODIUM 20 MG PO TABS: ORAL_TABLET | ORAL | 1 refills | Status: DC

## 2016-09-05 ENCOUNTER — Ambulatory Visit (INDEPENDENT_AMBULATORY_CARE_PROVIDER_SITE_OTHER): Payer: Medicare Other | Admitting: Family Medicine

## 2016-09-05 ENCOUNTER — Encounter: Payer: Self-pay | Admitting: Family Medicine

## 2016-09-05 ENCOUNTER — Other Ambulatory Visit: Payer: Self-pay | Admitting: Family Medicine

## 2016-09-05 ENCOUNTER — Ambulatory Visit: Payer: Medicare Other | Admitting: Family Medicine

## 2016-09-05 VITALS — BP 127/71 | HR 60 | Temp 99.0°F | Resp 20 | Wt 145.8 lb

## 2016-09-05 DIAGNOSIS — J069 Acute upper respiratory infection, unspecified: Secondary | ICD-10-CM | POA: Diagnosis not present

## 2016-09-05 DIAGNOSIS — Z1231 Encounter for screening mammogram for malignant neoplasm of breast: Secondary | ICD-10-CM

## 2016-09-05 MED ORDER — AMOXICILLIN-POT CLAVULANATE 875-125 MG PO TABS: 1.0000 | ORAL_TABLET | Freq: Two times a day (BID) | ORAL | 0 refills | Status: DC

## 2016-09-05 NOTE — Progress Notes (Signed)
Cassie White , March 24, 1941, 76 y.o., female MRN: [id number] Patient Care Team    Relationship Specialty Notifications Start End  Tammi Sou, MD PCP - General Family Medicine  12/25/14   Peter M Martinique, MD Consulting Physician Cardiology  06/22/15   Larey Seat, MD Consulting Physician Neurology  06/22/15   Suella Broad, MD Consulting Physician Physical Medicine and Rehabilitation  12/07/15   Zonia Kief, MD Consulting Physician Rehabilitation  07/24/16    Comment: [facility]  Starling Manns, MD  Orthopedic Surgery  08/08/16     CC: URI Subjective: Pt presents for an acute OV with complaints of URI symptoms of 2 days duration.  Associated symptoms include headache/frontal, rhinorrhea, mild sore throat a few days ago, that resolved. She denies fever, chills, nausea or vomit.  Pt has tried a coricidin to help with symptoms.   Allergies  Allergen Reactions  . Sulfa Antibiotics Swelling    face  . Tizanidine Other (See Comments)    delirium   Social History  Substance Use Topics  . Smoking status: Never Smoker  . Smokeless tobacco: Never Used  . Alcohol use No   Past Medical History:  Diagnosis Date  . Arthritis    DJD, back and both hips  . Chronic low back pain without sciatica    Lumbar spondylosis + scoliosis.  Summer 2017 Dr. Nelva Bush did ESI and pt got no relief.  Pt then saw Dr. Maia Petties [facility]: felt she had facet mediated pain; plan for B L345 MBB (??).  Marland Kitchen Diverticulosis    on colonoscopy 2004  . GERD (gastroesophageal reflux disease)   . Hx of cardiovascular stress test    Lexiscan Myoview (2/14):  Low risk, small ant defect likely breast attenuation, no ischemia, EF 69% (no change from 2010).    . Hyperlipidemia   . Hypertension    per pt  . Idiopathic scoliosis of thoracolumbar region   . Mitral regurgitation    Echo (2/14):  EF 60-65%, Gr 1 DD, mild AI, mild bileaflet MVP, mod post directed MR, mild LAE, PASP 35.  Marland Kitchen MVP  (mitral valve prolapse)   . Osteoporosis, senile    stable at the left hip 2014-2015.  Took fosamax for 10 yrs.  Plan: get repeat DEXA q14mo.  Marland Kitchen PAF (paroxysmal atrial fibrillation) (HCC)    Dr. Martinique: Flecainide, metopr, apxiaban.  . Peripheral neuropathy ([place]) 2008   Idiopathic vs familial (motor>sensory): gabapentin helpful  . Shoulder pain left   RC surg 09/2014   Past Surgical History:  Procedure Laterality Date  . BREAST SURGERY  Nov 2010   Benign biopsy, right  . CHOLECYSTECTOMY  03/2010   Dr.Gross  . COLONOSCOPY  10/03/2002   No polyps.  Repeat 10 yrs recommended but pt declines.  Pt declined cologuard 08/2016.  Marland Kitchen COMBINED HYSTERECTOMY VAGINAL / OOPHORECTOMY / A&P REPAIR  0000000   uncertain if ovaries were removed or not  . CYSTOCELE REPAIR    . Lumpectomy  06/2009   "Fatty Necrosis"  . PFT's  2014   NORMAL  . ROTATOR CUFF REPAIR Left 10/02/14   Dr. Veverly Fells  . TONSILLECTOMY    . TOTAL KNEE ARTHROPLASTY  09/2009   Right ;Dr Alvan Dame  . TOTAL KNEE ARTHROPLASTY Left 03/10/2016   Procedure: TOTAL KNEE ARTHROPLASTY;  Surgeon: Paralee Cancel, MD;  Location: [facility];  Service: Orthopedics;  Laterality: Left;  . TRANSTHORACIC ECHOCARDIOGRAM  09/2012; 09/27/15   2014: EF 60-65%, Gr  1 DD, mild AI, mild bileaflet MVP, mod post directed MR, mild LAE, PASP 35.  Repeat 2017: EF 55-60%, mild AR, mild MVP and mild MV regurg.  Marland Kitchen VAGINAL HYSTERECTOMY     For Uterine Deviation    Family History  Problem Relation Age of Onset  . Heart attack Father     in 35s  . Stroke Father 51  . Ovarian cancer Mother   . Heart attack Son 1    Smoker  . Coronary artery disease Brother   . Diabetes Neg Hx    Allergies as of 09/05/2016      Reactions   Sulfa Antibiotics Swelling   face   Tizanidine Other (See Comments)   delirium      Medication List       Accurate as of 09/05/16  3:50 PM. Always use your most recent med list.          apixaban 5 MG Tabs tablet Commonly known as:  ELIQUIS Take 1  tablet (5 mg total) by mouth 2 (two) times daily.   Biotin 5000 MCG Caps Take 5,000 mcg by mouth daily.   CALCIUM 600 PO Take 600 mg by mouth 2 (two) times daily.   cholecalciferol 1000 units tablet Commonly known as:  VITAMIN D Take 5,000 Units by mouth daily.   cyanocobalamin 1000 MCG/ML injection Commonly known as:  (VITAMIN B-12) Inject 1 mL (1,000 mcg total) into the muscle every 30 (thirty) days.   docusate sodium 100 MG capsule Commonly known as:  COLACE Take 1 capsule (100 mg total) by mouth 2 (two) times daily.   Fish Oil 1200 MG Caps Take 1,000 mg by mouth 2 (two) times daily.   flecainide 50 MG tablet Commonly known as:  TAMBOCOR Take 1 tablet (50 mg total) by mouth 2 (two) times daily.   gabapentin 300 MG capsule Commonly known as:  NEURONTIN TAKE 1 CAPSULE BY MOUTH AT LUNCH, 1 AT DINNER, AND 2 AT BEDTIME   HYDROcodone-acetaminophen 7.5-325 MG tablet Commonly known as:  NORCO Take 1-2 tablets by mouth every 4 (four) hours as needed for moderate pain.   metoprolol tartrate 25 MG tablet Commonly known as:  LOPRESSOR Take 1 tablet (25 mg total) by mouth 2 (two) times daily.   nitroGLYCERIN 0.4 MG SL tablet Commonly known as:  NITROSTAT Place 1 tablet (0.4 mg total) under the tongue every 5 (five) minutes as needed for chest pain.   polyethylene glycol packet Commonly known as:  MIRALAX / GLYCOLAX Take 17 g by mouth 2 (two) times daily.   pravastatin 20 MG tablet Commonly known as:  PRAVACHOL Take 1 tablet by mouth daily       No results found for this or any previous visit (from the past 24 hour(s)). No results found.   ROS: Negative, with the exception of above mentioned in HPI  Objective:  BP 127/71 (BP Location: Left Arm, Patient Position: Sitting, Cuff Size: Normal)   Pulse 60   Temp 99 F (37.2 C)   Resp 20   Wt 145 lb 12.8 oz (66.1 kg)   SpO2 98%   BMI 27.55 kg/m  Body mass index is 27.55 kg/m. Gen: Afebrile. No acute distress.  Nontoxic in appearance, well developed, well nourished.  HENT: AT. [place]. Bilateral TM visualized WNL. MMM, no oral lesions. Bilateral nares with erythema. Throat without erythema or exudates. No cough. Hoarseness present.  Eyes:Pupils Equal Round Reactive to light, Extraocular movements intact,  Conjunctiva without redness, discharge  or icterus. Neck/lymp/endocrine: Supple,no  lymphadenopathy CV: RRR  Chest: CTAB, no wheeze or crackles. Good air movement, normal resp effort.  Abd: Soft. NTND. BS present  Neuro: Normal gait. PERLA. EOMi. Alert. Oriented x3.  Assessment/Plan: MARWAH LASTRAPES is a 76 y.o. female present for acute OV for  Upper respiratory tract infection, unspecified type - rest, hydrate, flonase, coricidin.  - if not improving or worsening by Sunday, then get script filled and start.  - F/u PRN   electronically signed by:  Howard Pouch, DO  [place]

## 2016-09-05 NOTE — Patient Instructions (Signed)
Coricidin, start Flonase. Rest and hydrate.  You can try plain mucinex as well for drainage.   If on Sunday your symptoms are improving or are worsening start antibiotic.    Right now this is probably a virus, and you should start seeing improvement.     Cough, Adult Introduction A cough helps to clear your throat and lungs. A cough may last only 2-3 weeks (acute), or it may last longer than 8 weeks (chronic). Many different things can cause a cough. A cough may be a sign of an illness or another medical condition. Follow these instructions at home:  Pay attention to any changes in your cough.  Take medicines only as told by your doctor.  If you were prescribed an antibiotic medicine, take it as told by your doctor. Do not stop taking it even if you start to feel better.  Talk with your doctor before you try using a cough medicine.  Drink enough fluid to keep your pee (urine) clear or pale yellow.  If the air is dry, use a cold steam vaporizer or humidifier in your home.  Stay away from things that make you cough at work or at home.  If your cough is worse at night, try using extra pillows to raise your head up higher while you sleep.  Do not smoke, and try not to be around smoke. If you need help quitting, ask your doctor.  Do not have caffeine.  Do not drink alcohol.  Rest as needed. Contact a doctor if:  You have new problems (symptoms).  You cough up yellow fluid (pus).  Your cough does not get better after 2-3 weeks, or your cough gets worse.  Medicine does not help your cough and you are not sleeping well.  You have pain that gets worse or pain that is not helped with medicine.  You have a fever.  You are losing weight and you do not know why.  You have night sweats. Get help right away if:  You cough up blood.  You have trouble breathing.  Your heartbeat is very fast. This information is not intended to replace advice given to you by your health  care provider. Make sure you discuss any questions you have with your health care provider. Document Released: 04/03/2011 Document Revised: 12/27/2015 Document Reviewed: 09/27/2014  2017 Elsevier

## 2016-09-08 DIAGNOSIS — Z20828 Contact with and (suspected) exposure to other viral communicable diseases: Secondary | ICD-10-CM | POA: Diagnosis not present

## 2016-09-10 ENCOUNTER — Ambulatory Visit: Payer: Medicare Other | Admitting: Adult Health

## 2016-09-19 ENCOUNTER — Ambulatory Visit
Admission: RE | Admit: 2016-09-19 | Discharge: 2016-09-19 | Disposition: A | Discharge status: Home / Self Care | Payer: Medicare Other | Source: Ambulatory Visit | Attending: Family Medicine | Admitting: Family Medicine

## 2016-09-19 DIAGNOSIS — Z780 Asymptomatic menopausal state: Secondary | ICD-10-CM | POA: Diagnosis not present

## 2016-09-19 DIAGNOSIS — Z1231 Encounter for screening mammogram for malignant neoplasm of breast: Secondary | ICD-10-CM | POA: Diagnosis not present

## 2016-09-19 DIAGNOSIS — E2839 Other primary ovarian failure: Secondary | ICD-10-CM

## 2016-09-19 DIAGNOSIS — M810 Age-related osteoporosis without current pathological fracture: Secondary | ICD-10-CM | POA: Diagnosis not present

## 2016-09-22 ENCOUNTER — Other Ambulatory Visit: Payer: Self-pay | Admitting: Family Medicine

## 2016-09-22 DIAGNOSIS — R928 Other abnormal and inconclusive findings on diagnostic imaging of breast: Secondary | ICD-10-CM

## 2016-09-24 ENCOUNTER — Encounter: Payer: Self-pay | Admitting: Family Medicine

## 2016-09-24 ENCOUNTER — Ambulatory Visit
Admission: RE | Admit: 2016-09-24 | Discharge: 2016-09-24 | Disposition: A | Discharge status: Home / Self Care | Payer: Medicare Other | Source: Ambulatory Visit | Attending: Family Medicine | Admitting: Family Medicine

## 2016-09-24 DIAGNOSIS — R928 Other abnormal and inconclusive findings on diagnostic imaging of breast: Secondary | ICD-10-CM

## 2016-09-24 DIAGNOSIS — R921 Mammographic calcification found on diagnostic imaging of breast: Secondary | ICD-10-CM | POA: Diagnosis not present

## 2016-09-26 ENCOUNTER — Encounter: Payer: Self-pay | Admitting: Family Medicine

## 2016-09-29 DIAGNOSIS — M47816 Spondylosis without myelopathy or radiculopathy, lumbar region: Secondary | ICD-10-CM | POA: Diagnosis not present

## 2016-09-30 ENCOUNTER — Other Ambulatory Visit: Payer: Self-pay | Admitting: Physician Assistant

## 2016-09-30 DIAGNOSIS — I480 Paroxysmal atrial fibrillation: Secondary | ICD-10-CM

## 2016-10-02 ENCOUNTER — Other Ambulatory Visit: Payer: Self-pay | Admitting: *Deleted

## 2016-10-02 MED ORDER — ALENDRONATE SODIUM 70 MG PO TABS: 70.0000 mg | ORAL_TABLET | ORAL | 3 refills | Status: DC

## 2016-10-04 ENCOUNTER — Other Ambulatory Visit: Payer: Self-pay | Admitting: Adult Health

## 2016-10-04 DIAGNOSIS — G609 Hereditary and idiopathic neuropathy, unspecified: Secondary | ICD-10-CM

## 2016-10-15 DIAGNOSIS — M47816 Spondylosis without myelopathy or radiculopathy, lumbar region: Secondary | ICD-10-CM | POA: Diagnosis not present

## 2016-10-27 ENCOUNTER — Encounter: Payer: Self-pay | Admitting: Family Medicine

## 2016-10-29 ENCOUNTER — Ambulatory Visit: Payer: Medicare Other | Admitting: Adult Health

## 2016-10-30 ENCOUNTER — Encounter: Payer: Self-pay | Admitting: Family Medicine

## 2016-11-01 ENCOUNTER — Other Ambulatory Visit: Payer: Self-pay | Admitting: Cardiology

## 2016-11-01 DIAGNOSIS — I480 Paroxysmal atrial fibrillation: Secondary | ICD-10-CM

## 2016-11-07 ENCOUNTER — Telehealth: Payer: Self-pay | Admitting: *Deleted

## 2016-11-07 ENCOUNTER — Other Ambulatory Visit: Payer: Self-pay | Admitting: *Deleted

## 2016-11-07 DIAGNOSIS — Z1211 Encounter for screening for malignant neoplasm of colon: Secondary | ICD-10-CM

## 2016-11-07 MED ORDER — CYANOCOBALAMIN 1000 MCG/ML IJ SOLN: 1000.0000 ug | INTRAMUSCULAR | 1 refills | Status: DC

## 2016-11-07 NOTE — Telephone Encounter (Signed)
OK.  I guess she decided not to get the colonoscopy after all. Cologuard ordered as "future" in EMR.-thx

## 2016-11-07 NOTE — Telephone Encounter (Signed)
[facility].  RF request for Vitamin B12 LOV: 08/29/16 Next ov: None Last written: 10/02/15 #87ml w/ 1RF  Please advise. Thanks.

## 2016-11-07 NOTE — Telephone Encounter (Signed)
Pt called requesting order for cologuard. Please advise. Thanks.

## 2016-11-10 NOTE — Telephone Encounter (Signed)
Cologuard form completed and put up front for pt to come by and sign to be faxed. Pt has been advised.

## 2016-11-16 ENCOUNTER — Other Ambulatory Visit: Payer: Self-pay | Admitting: Cardiology

## 2016-11-16 DIAGNOSIS — I480 Paroxysmal atrial fibrillation: Secondary | ICD-10-CM

## 2016-11-19 ENCOUNTER — Telehealth: Payer: Self-pay | Admitting: Cardiology

## 2016-11-19 NOTE — Telephone Encounter (Signed)
New Message   Bjorn Loser called this in   Request for surgical clearance:  1. What type of surgery is being performed? Bi lateral Lumbar 3-4-5- RFA (radio frequence ablation)  2. When is this surgery scheduled? 12/17/16   3. Are there any medications that need to be held prior to surgery and how long? Eliquis 5 days prior  4. Name of physician performing surgery? [facility]  What is your office phone and fax number?  [phone] fax [phone]    Pt Has appt on 11/21/16 with Almyra Deforest

## 2016-11-21 ENCOUNTER — Encounter: Payer: Self-pay | Admitting: Physician Assistant

## 2016-11-21 ENCOUNTER — Ambulatory Visit (INDEPENDENT_AMBULATORY_CARE_PROVIDER_SITE_OTHER): Payer: Medicare Other | Admitting: Physician Assistant

## 2016-11-21 VITALS — BP 130/56 | HR 52 | Ht 61.0 in | Wt 151.4 lb

## 2016-11-21 DIAGNOSIS — I480 Paroxysmal atrial fibrillation: Secondary | ICD-10-CM

## 2016-11-21 DIAGNOSIS — I340 Nonrheumatic mitral (valve) insufficiency: Secondary | ICD-10-CM | POA: Diagnosis not present

## 2016-11-21 DIAGNOSIS — Z01810 Encounter for preprocedural cardiovascular examination: Secondary | ICD-10-CM

## 2016-11-21 DIAGNOSIS — Z79899 Other long term (current) drug therapy: Secondary | ICD-10-CM

## 2016-11-21 DIAGNOSIS — E785 Hyperlipidemia, unspecified: Secondary | ICD-10-CM

## 2016-11-21 MED ORDER — PRAVASTATIN SODIUM 40 MG PO TABS: ORAL_TABLET | ORAL | 5 refills | Status: DC

## 2016-11-21 MED ORDER — FLECAINIDE ACETATE 100 MG PO TABS: 100.0000 mg | ORAL_TABLET | Freq: Two times a day (BID) | ORAL | Status: DC

## 2016-11-21 NOTE — Telephone Encounter (Signed)
Returned call to [employer] with [place] office.Left message on personal voice mail Almyra Deforest PA cleared pt for upcoming surgery.Advised ok to hold Eliquis 48 hours prior to surgery.Hao's 11/21/16 office note faxed to you at fax # [phone].

## 2016-11-21 NOTE — Patient Instructions (Signed)
Medication Instructions:  INCREASE PRAVASTATIN TO 40MG  DAILY  If you need a refill on your cardiac medications before your next appointment, please call your pharmacy.  Labwork: FLP AND LFT IN 2 MONTHS (~June 2018) AT [facility] ON THE 1ST FLOOR  Follow-Up: Your physician wants you to follow-up in: 5-6 MONTHS WITH DR Martinique You will receive a reminder letter in the mail two months in advance. If you don't receive a letter, please call our office AUGUST 2018  to schedule the October 2018 follow-up appointment.  Special Instructions: HOLD ELIQUIS 76 HOURS PRIOR TO SCHEDULED SURGERY  CLEARED FOR SURGERY  Thank you for choosing [facility] at [facility]!!

## 2016-11-21 NOTE — Progress Notes (Signed)
Cardiology Office Note    Date:  11/22/2016   ID:  Cassie White, DOB 1941/03/19, MRN [id number]  PCP:  Tammi Sou, MD  Cardiologist:  Dr. Martinique   Cassie White is a 76 y.o. female who is being seen today for the preoperative clearance and advise on eliquis at the request of Dr. Eugenio Hoes prior to back surgery   Chief Complaint  Patient presents with  . Follow-up    pt is having surgery 5/16/8, needs to know when to stop her Eliquis    History of Present Illness:  Cassie White is a 76 y.o. female with PMH of MVP, MR, HLD, GERD and PAF. She had a Myoview in 2010 with anterior breast attenuation, otherwise normal. Review obtained in February 2014 was low risk, small anterior defect likely breast attenuation, however no ischemia, EF 69%. Echocardiogram obtained in February 2014 showed EF 29-93%, grade 1 diastolic dysfunction, moderate posterior directed MR, PASP 35 mmHg. Last echocardiogram obtained on 09/27/2015 showed EF 55-60%, mild AR, mild MR, PA peak pressure 36 mmHg. Patient was noted to be in atrial fibrillation during the study. She was placed on eliquis and flecainide and subsequently went back to sinus rhythm. She underwent Myoview on 10/18/2015 to rule out pro arrhythmia and the ischemic heart disease, there was moderate sized and intensity partially reversible anterior and apical defect, EF was 64%, this is likely representing shifting breast artifact, overall it was consider intermediate risk study. The study was reviewed by Dr. Martinique, no further testing was felt to be needed, the image was consider low risk.  She was last seen on 01/10/2016, she was doing well at this time. Most recent lipid panel obtained on 08/29/2016 showed total cholesterol 222, LDL 117, HDL 82, triglycerides 115. According to the patient, her activity is limited by her back pain. Even so, she would have no problem walking up 2 flight of stairs. She never had any history of coronary artery disease, she  denies any shortness of breath or chest discomfort. She is cleared from cardiology perspective to proceed with back surgery. She is a low risk patient for the intended surgery. Otherwise, she can stop to eliquis 48 hours prior to the surgery and restart as soon as possible. The only change she had compared to last year was the flecainide increased to 100 mg twice a day due to increased palpitation. Otherwise she has not felt any recurrence of atrial fibrillation since the dose was increased. I did recommend her to increase Pravachol to 40 mg daily to help control the cholesterol better. She will need a fasting lipid panel and LFTs in 6 weeks.   Past Medical History:  Diagnosis Date  . Abnormal mammogram of left breast    Likely benign microcalcifications--repeat L diag mammo 03/24/2017  . Arthritis    DJD, back and both hips  . Chronic low back pain without sciatica    Lumbar spondylosis + scoliosis.  Summer 2017 Dr. Nelva Bush did ESI and pt got no relief.  Pt then saw Dr. Maia Petties [facility]: felt she had facet mediated pain; plan for B L345 MBB (??).  Marland Kitchen Diverticulosis    on colonoscopy 2004  . GERD (gastroesophageal reflux disease)   . Hx of cardiovascular stress test    Lexiscan Myoview (2/14):  Low risk, small ant defect likely breast attenuation, no ischemia, EF 69% (no change from 2010).    . Hyperlipidemia   . Hypertension    per pt  .  Idiopathic scoliosis of thoracolumbar region   . Mitral regurgitation    Echo (2/14):  EF 60-65%, Gr 1 DD, mild AI, mild bileaflet MVP, mod post directed MR, mild LAE, PASP 35.  Marland Kitchen MVP (mitral valve prolapse)   . Osteoporosis, senile    stable at the left hip 2014-2015 (T-score -2.7 when she stopped bisphosphonate).  Took fosamax for 10 yrs.  Repeat DEXA 15mo later (09/2016) T-score -.3.1 (different machine, though).  Marland Kitchen PAF (paroxysmal atrial fibrillation) ([facility])    Dr. Martinique: Flecainide, metopr, apxiaban.  . Peripheral neuropathy 2008    Idiopathic vs familial (motor>sensory): gabapentin helpful  . Shoulder pain left   RC surg 09/2014    Past Surgical History:  Procedure Laterality Date  . BREAST EXCISIONAL BIOPSY Right 2010   Benign  . BREAST SURGERY  Nov 2010   Benign biopsy, right  . CHOLECYSTECTOMY  03/2010   Dr.Gross  . COLONOSCOPY  10/03/2002   No polyps.  Repeat 10 yrs recommended but pt declines.  Pt declined cologuard 08/2016.  Marland Kitchen COMBINED HYSTERECTOMY VAGINAL / OOPHORECTOMY / A&P REPAIR  4097   uncertain if ovaries were removed or not  . CYSTOCELE REPAIR    . Lumpectomy  06/2009   "Fatty Necrosis"  . PFT's  2014   NORMAL  . ROTATOR CUFF REPAIR Left 10/02/14   Dr. Veverly Fells  . TONSILLECTOMY    . TOTAL KNEE ARTHROPLASTY  09/2009   Right ;Dr Alvan Dame  . TOTAL KNEE ARTHROPLASTY Left 03/10/2016   Procedure: TOTAL KNEE ARTHROPLASTY;  Surgeon: Paralee Cancel, MD;  Location: [facility];  Service: Orthopedics;  Laterality: Left;  . TRANSTHORACIC ECHOCARDIOGRAM  09/2012; 09/27/15   2014: EF 60-65%, Gr 1 DD, mild AI, mild bileaflet MVP, mod post directed MR, mild LAE, PASP 35.  Repeat 2017: EF 55-60%, mild AR, mild MVP and mild MV regurg.  Marland Kitchen VAGINAL HYSTERECTOMY     For Uterine Deviation     Current Medications: Outpatient Medications Prior to Visit  Medication Sig Dispense Refill  . alendronate (FOSAMAX) 70 MG tablet Take 1 tablet (70 mg total) by mouth every 7 (seven) days. Take with a full glass of water on an empty stomach. 12 tablet 3  . apixaban (ELIQUIS) 5 MG TABS tablet Take 1 tablet (5 mg total) by mouth 2 (two) times daily. 60 tablet 11  . Biotin 5000 MCG CAPS Take 5,000 mcg by mouth daily.    . Calcium Carbonate (CALCIUM 600 PO) Take 600 mg by mouth 2 (two) times daily.     . cholecalciferol (VITAMIN D) 1000 UNITS tablet Take 5,000 Units by mouth daily.     . cyanocobalamin (,VITAMIN B-12,) 1000 MCG/ML injection Inject 1 mL (1,000 mcg total) into the muscle every 30 (thirty) days. 10 mL 1  . gabapentin (NEURONTIN)  300 MG capsule TAKE 1 CAPSULE BY MOUTH AT LUNCH, 1 AT DINNER, AND 2 AT BEDTIME 360 capsule 3  . HYDROcodone-acetaminophen (NORCO) 7.5-325 MG tablet Take 1-2 tablets by mouth every 4 (four) hours as needed for moderate pain. 100 tablet 0  . metoprolol tartrate (LOPRESSOR) 25 MG tablet Take 1 tablet (25 mg total) by mouth 2 (two) times daily. 180 tablet 3  . nitroGLYCERIN (NITROSTAT) 0.4 MG SL tablet Place 1 tablet (0.4 mg total) under the tongue every 5 (five) minutes as needed for chest pain. 25 tablet 5  . Omega-3 Fatty Acids (FISH OIL) 1200 MG CAPS Take 1,000 mg by mouth 2 (two) times daily.     Marland Kitchen  flecainide (TAMBOCOR) 50 MG tablet TAKE 1 TABLET (50 MG TOTAL) BY MOUTH 2 (TWO) TIMES DAILY. 30 tablet 0  . pravastatin (PRAVACHOL) 20 MG tablet Take 1 tablet by mouth daily 90 tablet 1  . amoxicillin-clavulanate (AUGMENTIN) 875-125 MG tablet Take 1 tablet by mouth 2 (two) times daily. (Patient not taking: Reported on 11/21/2016) 20 tablet 0  . docusate sodium (COLACE) 100 MG capsule Take 1 capsule (100 mg total) by mouth 2 (two) times daily. (Patient not taking: Reported on 11/21/2016) 10 capsule 0  . polyethylene glycol (MIRALAX / GLYCOLAX) packet Take 17 g by mouth 2 (two) times daily. (Patient not taking: Reported on 11/21/2016) 14 each 0   No facility-administered medications prior to visit.      Allergies:   Sulfa antibiotics and Tizanidine   Social History   Social History  . Marital status: Married    Spouse name: N/A  . Number of children: 3  . Years of education: HS grad   Occupational History  . [employer]- bus driver- retired    Social History Main Topics  . Smoking status: Never Smoker  . Smokeless tobacco: Never Used  . Alcohol use No  . Drug use: No  . Sexual activity: No   Other Topics Concern  . None   Social History Narrative   Widow, has 3 children, 4 grandchildren.   Orig from [place].   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     Caffiene, 1 cup daily avg.   No tob, no alc, no drugs.   Exercise: walking, limited by bilat hip pain.     Family History:  The patient's family history includes Coronary artery disease in her brother; Heart attack in her father; Heart attack (age of onset: 65) in her son; Ovarian cancer in her mother; Stroke (age of onset: 82) in her father.   ROS:   Please see the history of present illness.    ROS All other systems reviewed and are negative.   PHYSICAL EXAM:   VS:  BP (!) 130/56 (BP Location: Right Arm)   Pulse (!) 52   Ht 5\' 1"  (1.549 m)   Wt 151 lb 6.4 oz (68.7 kg)   BMI 28.61 kg/m    GEN: Well nourished, well developed, in no acute distress  HEENT: normal  Neck: no JVD, carotid bruits, or masses Cardiac: RRR; no murmurs, rubs, or gallops,no edema  Respiratory:  clear to auscultation bilaterally, normal work of breathing GI: soft, nontender, nondistended, + BS MS: no deformity or atrophy  Skin: warm and dry, no rash Neuro:  Alert and Oriented x 3, Strength and sensation are intact Psych: euthymic mood, full affect  Wt Readings from Last 3 Encounters:  11/21/16 151 lb 6.4 oz (68.7 kg)  09/05/16 145 lb 12.8 oz (66.1 kg)  08/29/16 144 lb 4 oz (65.4 kg)      Studies/Labs Reviewed:   EKG:  EKG is ordered today.  The ekg ordered today demonstrates Sinus bradycardia without significant ST-T wave changes.  Recent Labs: 08/29/2016: BUN 13; Creatinine, Ser 0.81; Hemoglobin 13.7; Platelets 198.0; Potassium 4.9; Sodium 141   Lipid Panel    Component Value Date/Time   CHOL 222 (H) 08/29/2016 1024   CHOL 211 (H) 04/28/2014 1026   TRIG 115.0 08/29/2016 1024   TRIG 95 04/28/2014 1026   HDL 82.50 08/29/2016 1024   HDL 101 04/28/2014 1026   CHOLHDL 3 08/29/2016 1024   VLDL 23.0 08/29/2016 1024  LDLCALC 117 (H) 08/29/2016 1024   LDLCALC 91 04/28/2014 1026   LDLDIRECT 87.5 09/07/2012 1022    Additional studies/ records that were reviewed today include:   Echo  2017 LV EF: 55% -   60%  Study Conclusions  - Left ventricle: The cavity size was normal. There was mild   concentric hypertrophy. Systolic function was normal. The   estimated ejection fraction was in the range of 55% to 60%. - Aortic valve: There was mild regurgitation. - Mitral valve: Mild anterior leafelt prolapse. There was mild   regurgitation. - Atrial septum: No defect or patent foramen ovale was identified. - Pulmonary arteries: PA peak pressure: 36 mm Hg (S).     Myoview 10/18/2015 Study Highlights    The left ventricular ejection fraction is moderately decreased (30-44%).  Nuclear stress EF: 64%.  There was no ST segment deviation noted during stress.  No T wave inversion was noted during stress.  Defect 1: There is a medium defect of moderate severity.  This is an intermediate risk study.  Findings consistent with prior myocardial infarction with peri-infarct ischemia.   Moderate size and intensity partially reversible anterior and apical wall defect (SDS 4). LVEF 64% with normal wall motion. This likely represents shifting breast artifact given normal wall motion and LV function. Clinical correlation is advised. This is an intermediate risk study.       ASSESSMENT:    1. Preop cardiovascular exam   2. PAF (paroxysmal atrial fibrillation) ([place])   3. Hyperlipidemia, unspecified hyperlipidemia type   4. Medication management   5. Non-rheumatic mitral regurgitation      PLAN:  In order of problems listed above:  1. Preoperative clearance for back surgery by Dr. Eugenio Hoes  - She can complete 4 METS without any issue. She denies ever having chest discomfort or shortness of breath. She has no prior history of CAD. She is cleared from cardiology perspective to proceed with surgery without any additional workup. She is a low risk patient. She will need to hold eliquis 48 hours prior to the surgery.   2. PAF on eliquis: She is on flecainide, dosage was  increased to 100 mg twice a day since last visit. Since the increased dosage, she has not had any significant recurrence.   3. Hyperlipidemia: I did increase Pravachol to 40 mg daily. Last lipid panel obtained in January 2018 showed LDL 117, cholesterol 222, HDL 82, triglyceride 115. She will need a fasting lipid panel and LFT in 2 month.    Medication Adjustments/Labs and Tests Ordered: Current medicines are reviewed at length with the patient today.  Concerns regarding medicines are outlined above.  Medication changes, Labs and Tests ordered today are listed in the Patient Instructions below. Patient Instructions  Medication Instructions:  INCREASE PRAVASTATIN TO 40MG  DAILY  If you need a refill on your cardiac medications before your next appointment, please call your pharmacy.  Labwork: FLP AND LFT IN 2 MONTHS (~June 2018) AT [facility] ON THE 1ST FLOOR  Follow-Up: Your physician wants you to follow-up in: 5-6 MONTHS WITH DR Martinique You will receive a reminder letter in the mail two months in advance. If you don't receive a letter, please call our office AUGUST 2018  to schedule the October 2018 follow-up appointment.  Special Instructions: HOLD ELIQUIS 48 HOURS PRIOR TO SCHEDULED SURGERY  CLEARED FOR SURGERY  Thank you for choosing [facility], [place], [place]  11/22/2016 10:07 AM    [address], [place], [place]  [address] Phone: [phone]; Fax: [phone]

## 2016-11-22 ENCOUNTER — Encounter: Payer: Self-pay | Admitting: Physician Assistant

## 2016-11-25 ENCOUNTER — Encounter: Payer: Self-pay | Admitting: Family Medicine

## 2016-12-05 ENCOUNTER — Other Ambulatory Visit: Payer: Self-pay | Admitting: Cardiology

## 2016-12-05 DIAGNOSIS — I480 Paroxysmal atrial fibrillation: Secondary | ICD-10-CM

## 2016-12-05 NOTE — Telephone Encounter (Signed)
REFILL 

## 2016-12-10 ENCOUNTER — Ambulatory Visit (INDEPENDENT_AMBULATORY_CARE_PROVIDER_SITE_OTHER): Payer: Medicare Other | Admitting: Adult Health

## 2016-12-10 ENCOUNTER — Encounter: Payer: Self-pay | Admitting: Adult Health

## 2016-12-10 VITALS — BP 146/62 | HR 52 | Ht 61.0 in | Wt 152.2 lb

## 2016-12-10 DIAGNOSIS — G629 Polyneuropathy, unspecified: Secondary | ICD-10-CM

## 2016-12-10 DIAGNOSIS — M7130 Other bursal cyst, unspecified site: Secondary | ICD-10-CM | POA: Diagnosis not present

## 2016-12-10 NOTE — Patient Instructions (Signed)
Continue Gabapentin Follow-up with PCP or MD in [facility] for cyst on feet If your symptoms worsen or you develop new symptoms please let us know.

## 2016-12-10 NOTE — Progress Notes (Signed)
I agree with the assessment and plan as directed by NP .The patient is known to me .   Rhyen Mazariego, MD  

## 2016-12-10 NOTE — Progress Notes (Signed)
PATIENT: Cassie White DOB: Jul 16, 1941  REASON FOR VISIT: follow up- neuropathy HISTORY FROM: patient  HISTORY OF PRESENT ILLNESS: [name] is a 76 year old female with a history of neuropathy. She returns today for follow-up. She continues on gabapentin 600 mg twice a day. She feels that this is working well for her she states that she still has burning and tingling in the feet however is tolerable. She does state that she's having back pain and is scheduled for radiotherapy and ablation on the lumbar spine. She states because of this pain she does have difficulty walking. But denies any falls. She also reports to pocket of fluid on both feet. She states that this has occurred in the past and she had the fluid pulled off by a physician in [place]. However she states that the fluid keeps reoccurring. She returns today for an evaluation.  HISTORY 09/10/15: [name] is a 76 year old female with a history of neuropathy. She returns today for follow-up. She continues to take gabapentin 300 mg one tablet at lunch , 1 tablet at dinner and 2 tablets at bedtime. She states that this is working well for her. She states occasionally she'll have some burning and tingling in the bottom of the feet but it is tolerable. She states that she is sleeping well. Denies any changes with her gait or balance. She does not use an assistive device when ambulating. She states that she did suffer a fall last week however that was because she tripped over someone's foot. Fortunately she did not suffer any significant injuries she does have a bruise on the right knee. She denies any new neurological symptoms. She returns today to have her gabapentin refilled.   REVIEW OF SYSTEMS: Out of a complete 14 system review of symptoms, the patient complains only of the following symptoms, and all other reviewed systems are negative.  Palpitations, murmur, ringing in ears, back pain  ALLERGIES: Allergies  Allergen  Reactions  . Sulfa Antibiotics Swelling    face  . Tizanidine Other (See Comments)    delirium    HOME MEDICATIONS: Outpatient Medications Prior to Visit  Medication Sig Dispense Refill  . alendronate (FOSAMAX) 70 MG tablet Take 1 tablet (70 mg total) by mouth every 7 (seven) days. Take with a full glass of water on an empty stomach. 12 tablet 3  . apixaban (ELIQUIS) 5 MG TABS tablet Take 1 tablet (5 mg total) by mouth 2 (two) times daily. 60 tablet 11  . Biotin 5000 MCG CAPS Take 5,000 mcg by mouth daily.    . Calcium Carbonate (CALCIUM 600 PO) Take 600 mg by mouth 2 (two) times daily.     . cholecalciferol (VITAMIN D) 1000 UNITS tablet Take 5,000 Units by mouth daily.     . cyanocobalamin (,VITAMIN B-12,) 1000 MCG/ML injection Inject 1 mL (1,000 mcg total) into the muscle every 30 (thirty) days. 10 mL 1  . flecainide (TAMBOCOR) 50 MG tablet TAKE 1 TABLET (50 MG TOTAL) BY MOUTH 2 (TWO) TIMES DAILY.**PATIENT OVERDUE FOR FOLLOW UP VISIT* 30 tablet 5  . gabapentin (NEURONTIN) 300 MG capsule TAKE 1 CAPSULE BY MOUTH AT LUNCH, 1 AT DINNER, AND 2 AT BEDTIME 360 capsule 3  . HYDROcodone-acetaminophen (NORCO) 7.5-325 MG tablet Take 1-2 tablets by mouth every 4 (four) hours as needed for moderate pain. 100 tablet 0  . metoprolol tartrate (LOPRESSOR) 25 MG tablet Take 1 tablet (25 mg total) by mouth 2 (two) times daily. 180 tablet  3  . nitroGLYCERIN (NITROSTAT) 0.4 MG SL tablet Place 1 tablet (0.4 mg total) under the tongue every 5 (five) minutes as needed for chest pain. 25 tablet 5  . Omega-3 Fatty Acids (FISH OIL) 1200 MG CAPS Take 1,000 mg by mouth 2 (two) times daily.     . pravastatin (PRAVACHOL) 40 MG tablet Take 1 tablet by mouth daily 30 tablet 5   No facility-administered medications prior to visit.     PAST MEDICAL HISTORY: Past Medical History:  Diagnosis Date  . Abnormal mammogram of left breast    Likely benign microcalcifications--repeat L diag mammo 03/24/2017  . Arthritis     DJD, back and both hips  . Chronic low back pain without sciatica    Lumbar spondylosis + scoliosis.  Summer 2017 Dr. Nelva Bush did ESI and pt got no relief.  Pt then saw Dr[facility] Maia [facility]: felt she had facet mediated pain; plan for B L345 MBB (??).  Marland Kitchen Diverticulosis    on colonoscopy 2004  . GERD (gastroesophageal reflux disease)   . Hx of cardiovascular stress test    Lexiscan Myoview (2/14):  Low risk, small ant defect likely breast attenuation, no ischemia, EF 69% (no change from 2010).    . Hyperlipidemia   . Hypertension    per pt  . Idiopathic scoliosis of thoracolumbar region   . Lumbar back pain   . Mitral regurgitation    Echo (2/14):  EF 60-65%, Gr 1 DD, mild AI, mild bileaflet MVP, mod post directed MR, mild LAE, PASP 35.  Marland Kitchen MVP (mitral valve prolapse)   . Osteoporosis, senile    stable at the left hip 2014-2015 (T-score -2.7 when she stopped bisphosphonate).  Took fosamax for 10 yrs.  Repeat DEXA 2mo later (09/2016) T-score -.3.1 (different machine, though).  Marland Kitchen PAF (paroxysmal atrial fibrillation) (HCC)    Dr. Martinique: Flecainide, metopr, apxiaban.  . Peripheral neuropathy 2008   Idiopathic vs familial (motor>sensory): gabapentin helpful  . Shoulder pain left   RC surg 09/2014    PAST SURGICAL HISTORY: Past Surgical History:  Procedure Laterality Date  . BREAST EXCISIONAL BIOPSY Right 2010   Benign  . BREAST SURGERY  Nov 2010   Benign biopsy, right  . CHOLECYSTECTOMY  03/2010   Dr.Gross  . COLONOSCOPY  10/03/2002   No polyps.  Repeat 10 yrs recommended but pt declines.  Pt declined cologuard 08/2016.  Marland Kitchen COMBINED HYSTERECTOMY VAGINAL / OOPHORECTOMY / A&P REPAIR  6962   uncertain if ovaries were removed or not  . CYSTOCELE REPAIR    . Lumpectomy  06/2009   "Fatty Necrosis"  . PFT's  2014   NORMAL  . ROTATOR CUFF REPAIR Left 10/02/14   Dr. Veverly Fells  . TONSILLECTOMY    . TOTAL KNEE ARTHROPLASTY  09/2009   Right ;Dr Alvan Dame  . TOTAL KNEE ARTHROPLASTY Left  03/10/2016   Procedure: TOTAL KNEE ARTHROPLASTY;  Surgeon: Paralee Cancel, MD;  Location: [facility];  Service: Orthopedics;  Laterality: Left;  . TRANSTHORACIC ECHOCARDIOGRAM  09/2012; 09/27/15   2014: EF 60-65%, Gr 1 DD, mild AI, mild bileaflet MVP, mod post directed MR, mild LAE, PASP 35.  Repeat 2017: EF 55-60%, mild AR, mild MVP and mild MV regurg.  Marland Kitchen VAGINAL HYSTERECTOMY     For Uterine Deviation     FAMILY HISTORY: Family History  Problem Relation Age of Onset  . Heart attack Father     in 11s  . Stroke Father 58  . Ovarian  cancer Mother   . Heart attack Son 71    Smoker  . Coronary artery disease Brother   . Diabetes Neg Hx     SOCIAL HISTORY: Social History   Social History  . Marital status: Married    Spouse name: N/A  . Number of children: 3  . Years of education: HS grad   Occupational History  . [employer]- bus driver- retired    Social History Main Topics  . Smoking status: Never Smoker  . Smokeless tobacco: Never Used  . Alcohol use No  . Drug use: No  . Sexual activity: No   Other Topics Concern  . Not on file   Social History Narrative   Widow, has 3 children, 4 grandchildren.   Orig from [place].   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   Caffiene, 1 cup daily avg.   No tob, no alc, no drugs.   Exercise: walking, limited by bilat hip pain.      PHYSICAL EXAM  Vitals:   12/10/16 0924  BP: (!) 146/62  Pulse: (!) 52  Weight: 152 lb 3.2 oz (69 kg)  Height: 5\' 1"  (1.549 m)   Body mass index is 28.76 kg/m.  Generalized: Well developed, in no acute distress Lower extremities: Two fluid filled pockets on the top of the right and left foot    Neurological examination  Mentation: Alert oriented to time, place, history taking. Follows all commands speech and language fluent Cranial nerve II-XII: Pupils were equal round reactive to light. Extraocular movements were full, visual field were full on  confrontational test. Facial sensation and strength were normal. Uvula tongue midline. Head turning and shoulder shrug  were normal and symmetric. Motor: The motor testing reveals 5 over 5 strength of all 4 extremities. Good symmetric motor tone is noted throughout.  Sensory: Sensory testing is intact to soft touch on all 4 extremities. No evidence of extinction is noted.  Coordination: Cerebellar testing reveals good finger-nose-finger and heel-to-shin bilaterally.  Gait and station: Gait is normal. Tandem gait is unsteady. Romberg is negative. No drift is seen.    DIAGNOSTIC DATA (LABS, IMAGING, TESTING) - I reviewed patient records, labs, notes, testing and imaging myself where available.  Lab Results  Component Value Date   WBC 5.8 08/29/2016   HGB 13.7 08/29/2016   HCT 40.9 08/29/2016   MCV 90.4 08/29/2016   PLT 198.0 08/29/2016      Component Value Date/Time   NA 141 08/29/2016 1024   K 4.9 08/29/2016 1024   CL 106 08/29/2016 1024   CO2 30 08/29/2016 1024   GLUCOSE 94 08/29/2016 1024   BUN 13 08/29/2016 1024   CREATININE 0.81 08/29/2016 1024   CALCIUM 10.5 08/29/2016 1024   PROT 6.4 07/03/2015 0813   ALBUMIN 3.9 07/03/2015 0813   AST 16 07/03/2015 0813   ALT 13 07/03/2015 0813   ALKPHOS 66 07/03/2015 0813   BILITOT 0.7 07/03/2015 0813   GFRNONAA >60 03/11/2016 0358   GFRAA >60 03/11/2016 0358   Lab Results  Component Value Date   CHOL 222 (H) 08/29/2016   HDL 82.50 08/29/2016   LDLCALC 117 (H) 08/29/2016   LDLDIRECT 87.5 09/07/2012   TRIG 115.0 08/29/2016   CHOLHDL 3 08/29/2016   Lab Results  Component Value Date   HGBA1C  09/19/2010    5.3 (NOTE)  According to the ADA Clinical Practice Recommendations for 2011, when HbA1c is used as a screening test:   >=6.5%   Diagnostic of Diabetes Mellitus           (if abnormal result  is confirmed)  5.7-6.4%   Increased risk of developing Diabetes  [place]  References:Diagnosis and Classification of Diabetes Mellitus,Diabetes IFBP,7943,27(MDYJW 1):S62-S69 and Standards of Medical Care in         Diabetes - 2011,Diabetes LKHV,7473,40  (Suppl 1):S11-S61.   Lab Results  Component Value Date   VITAMINB12 1,250 (H) 04/28/2014   Lab Results  Component Value Date   TSH 1.63 09/24/2015      ASSESSMENT AND PLAN 76 y.o. year old female  has a past medical history of Abnormal mammogram of left breast; Arthritis; Chronic low back pain without sciatica; Diverticulosis; GERD (gastroesophageal reflux disease); cardiovascular stress test; Hyperlipidemia; Hypertension; Idiopathic scoliosis of thoracolumbar region; Lumbar back pain; Mitral regurgitation; MVP (mitral valve prolapse); Osteoporosis, senile; PAF (paroxysmal atrial fibrillation) ([facility]); Peripheral neuropathy (2008); and Shoulder pain (left). here with:  1. Neuropathy  2. Synovial cyst  Overall the patient is doing well. She will continue on gabapentin 600 mg twice a day. It appears that she may have possible synovial cysts on the top of the right and left foot. I have advised that she can follow-up with her primary care or the physician in [place] to have the fluid drawn off. She voiced understanding. She will follow-up in one year with Dr. Brett Fairy.   Ward Givens, MSN, NP-C 12/10/2016, 9:40 AM [facility] [address], [place], [place] [address] [phone]

## 2017-01-06 ENCOUNTER — Telehealth: Payer: Self-pay | Admitting: Family Medicine

## 2017-01-06 NOTE — Telephone Encounter (Signed)
Patient states Dr. Jordan(cardiology) has ordered labs for her.  She wants to know if she can come to [facility] office to have labs done.  When asked what labs she needs done, patient states she does not know.  No lab orders are showing in patient's chart.  Patient was advised pcp out of office this afternoon and it would be tomorrow before the message is addressed.  Patient states she has an appt tomorrow morning, please call her back tomorrow afternoon.

## 2017-01-07 DIAGNOSIS — M47816 Spondylosis without myelopathy or radiculopathy, lumbar region: Secondary | ICD-10-CM | POA: Diagnosis not present

## 2017-01-07 DIAGNOSIS — M47817 Spondylosis without myelopathy or radiculopathy, lumbosacral region: Secondary | ICD-10-CM | POA: Diagnosis not present

## 2017-01-08 ENCOUNTER — Telehealth: Payer: Self-pay | Admitting: Cardiology

## 2017-01-08 DIAGNOSIS — E7800 Pure hypercholesterolemia, unspecified: Secondary | ICD-10-CM

## 2017-01-08 NOTE — Telephone Encounter (Signed)
I do not have orders for any labs.   Patient was made aware that the orders need to be in the computer in order for me to be able to draw them.  Patient will contact her cardiologist to get orders placed as future.

## 2017-01-08 NOTE — Telephone Encounter (Signed)
Spoke with pt, aware lab work orders placed per instruction from dr [employer] office.

## 2017-01-08 NOTE — Telephone Encounter (Signed)
New message     Pt needs you to send lab orders to Dr Ernestine Conrad office so she can have blood work done at his office

## 2017-01-09 ENCOUNTER — Other Ambulatory Visit (INDEPENDENT_AMBULATORY_CARE_PROVIDER_SITE_OTHER): Payer: Medicare Other

## 2017-01-09 DIAGNOSIS — E7800 Pure hypercholesterolemia, unspecified: Secondary | ICD-10-CM

## 2017-01-09 LAB — HEPATIC FUNCTION PANEL
ALBUMIN: 4.2 g/dL (ref 3.5–5.2)
ALT: 14 U/L (ref 0–35)
AST: 15 U/L (ref 0–37)
Alkaline Phosphatase: 56 U/L (ref 39–117)
BILIRUBIN DIRECT: 0.2 mg/dL (ref 0.0–0.3)
TOTAL PROTEIN: 6.5 g/dL (ref 6.0–8.3)
Total Bilirubin: 0.9 mg/dL (ref 0.2–1.2)

## 2017-01-09 LAB — LIPID PANEL
CHOL/HDL RATIO: 2
Cholesterol: 185 mg/dL (ref 0–200)
HDL: 85.2 mg/dL (ref >39.00)
LDL CALC: 77 mg/dL (ref 0–99)
NONHDL: 99.4
TRIGLYCERIDES: 111 mg/dL (ref 0.0–149.0)
VLDL: 22.2 mg/dL (ref 0.0–40.0)

## 2017-01-18 NOTE — Progress Notes (Deleted)
Cardiology Office Note:    Date:  01/18/2017   ID:  [name], DOB 1941/07/07, MRN [id number]  PCP:  Tammi Sou, MD  Cardiologist:  Dr. Stephens Shreve Martinique    No chief complaint on file.   History of Present Illness:     Cassie White is a 76 y.o. female with a hx of MVP, mitral regurgitation, HL, GERD.  She previously had a myoview in 2010 with anterior breast attenuation, o/w normal. She underwent stress testing with a Lexiscan Myoview (2/14): Low risk, small ant defect likely breast attenuation, no ischemia, EF 69% (no change from 2010). Echo (2/14): EF 60-65%, Gr 1 DD, mild AI, mild bileaflet MVP, mod post directed MR, mild LAE, PASP 35.   Seen on 09/17/15 for surgical clearance for  knee surgery. Follow-up echocardiogram was arranged. This demonstrated anterior leaflet mitral valve prolapse with mild mitral regurgitation. Patient was noted to be in atrial fibrillation during the study. She was seen again on 09/28/15. She was back in NSR. Upon further review, when in atrial fibrillation, heart rates were in the 130s.  CHADS2-VASc=3 (age, female, HTN).  She was placed on Apixaban 5 bid.  Metoprolol Tartrate dose was adjusted. Myoview was low risk with some breast attenuation.   She was seen again in  March 2017. She was in atrial fibrillation with RVR. She was started on flecainide 50 mg twice a day and converted to NSR.    In August 2017 she underwent left TKR. She did have some Afib post op.   On follow up today she is feeling well. She does have chronic back pain.   Past Medical History:  Diagnosis Date  . Abnormal mammogram of left breast    Likely benign microcalcifications--repeat L diag mammo 03/24/2017  . Arthritis    DJD, back and both hips  . Chronic low back pain without sciatica    Lumbar spondylosis + scoliosis.  Summer 2017 Dr. Nelva Bush did ESI and pt got no relief.  Pt then saw Dr. Maia Petties [facility]: felt she had facet mediated pain; plan for  B L345 MBB (??).  Marland Kitchen Diverticulosis    on colonoscopy 2004  . GERD (gastroesophageal reflux disease)   . Hx of cardiovascular stress test    Lexiscan Myoview (2/14):  Low risk, small ant defect likely breast attenuation, no ischemia, EF 69% (no change from 2010).    . Hyperlipidemia   . Hypertension    per pt  . Idiopathic scoliosis of thoracolumbar region   . Lumbar back pain   . Mitral regurgitation    Echo (2/14):  EF 60-65%, Gr 1 DD, mild AI, mild bileaflet MVP, mod post directed MR, mild LAE, PASP 35.  Marland Kitchen MVP (mitral valve prolapse)   . Osteoporosis, senile    stable at the left hip 2014-2015 (T-score -2.7 when she stopped bisphosphonate).  Took fosamax for 10 yrs.  Repeat DEXA 81mo later (09/2016) T-score -.3.1 (different machine, though).  Marland Kitchen PAF (paroxysmal atrial fibrillation) (HCC)    Dr. Martinique: Flecainide, metopr, apxiaban.  . Peripheral neuropathy 2008   Idiopathic vs familial (motor>sensory): gabapentin helpful  . Shoulder pain left   RC surg 09/2014    Past Surgical History:  Procedure Laterality Date  . BREAST EXCISIONAL BIOPSY Right 2010   Benign  . BREAST SURGERY  Nov 2010   Benign biopsy, right  . CHOLECYSTECTOMY  03/2010   Dr.Gross  . COLONOSCOPY  10/03/2002   No polyps.  Repeat 10  yrs recommended but pt declines.  Pt declined cologuard 08/2016.  Marland Kitchen COMBINED HYSTERECTOMY VAGINAL / OOPHORECTOMY / A&P REPAIR  5400   uncertain if ovaries were removed or not  . CYSTOCELE REPAIR    . Lumpectomy  06/2009   "Fatty Necrosis"  . PFT's  2014   NORMAL  . ROTATOR CUFF REPAIR Left 10/02/14   Dr. Veverly Fells  . TONSILLECTOMY    . TOTAL KNEE ARTHROPLASTY  09/2009   Right ;Dr Alvan Dame  . TOTAL KNEE ARTHROPLASTY Left 03/10/2016   Procedure: TOTAL KNEE ARTHROPLASTY;  Surgeon: Paralee Cancel, MD;  Location: [facility];  Service: Orthopedics;  Laterality: Left;  . TRANSTHORACIC ECHOCARDIOGRAM  09/2012; 09/27/15   2014: EF 60-65%, Gr 1 DD, mild AI, mild bileaflet MVP, mod post directed MR, mild  LAE, PASP 35.  Repeat 2017: EF 55-60%, mild AR, mild MVP and mild MV regurg.  Marland Kitchen VAGINAL HYSTERECTOMY     For Uterine Deviation     Current Medications: Outpatient Medications Prior to Visit  Medication Sig Dispense Refill  . alendronate (FOSAMAX) 70 MG tablet Take 1 tablet (70 mg total) by mouth every 7 (seven) days. Take with a full glass of water on an empty stomach. 12 tablet 3  . apixaban (ELIQUIS) 5 MG TABS tablet Take 1 tablet (5 mg total) by mouth 2 (two) times daily. 60 tablet 11  . Biotin 5000 MCG CAPS Take 5,000 mcg by mouth daily.    . Calcium Carbonate (CALCIUM 600 PO) Take 600 mg by mouth 2 (two) times daily.     . cholecalciferol (VITAMIN D) 1000 UNITS tablet Take 5,000 Units by mouth daily.     . cyanocobalamin (,VITAMIN B-12,) 1000 MCG/ML injection Inject 1 mL (1,000 mcg total) into the muscle every 30 (thirty) days. 10 mL 1  . flecainide (TAMBOCOR) 50 MG tablet TAKE 1 TABLET (50 MG TOTAL) BY MOUTH 2 (TWO) TIMES DAILY.**PATIENT OVERDUE FOR FOLLOW UP VISIT* 30 tablet 5  . gabapentin (NEURONTIN) 300 MG capsule TAKE 1 CAPSULE BY MOUTH AT LUNCH, 1 AT DINNER, AND 2 AT BEDTIME 360 capsule 3  . HYDROcodone-acetaminophen (NORCO) 7.5-325 MG tablet Take 1-2 tablets by mouth every 4 (four) hours as needed for moderate pain. 100 tablet 0  . metoprolol tartrate (LOPRESSOR) 25 MG tablet Take 1 tablet (25 mg total) by mouth 2 (two) times daily. 180 tablet 3  . nitroGLYCERIN (NITROSTAT) 0.4 MG SL tablet Place 1 tablet (0.4 mg total) under the tongue every 5 (five) minutes as needed for chest pain. 25 tablet 5  . Omega-3 Fatty Acids (FISH OIL) 1200 MG CAPS Take 1,000 mg by mouth 2 (two) times daily.     . pravastatin (PRAVACHOL) 40 MG tablet Take 1 tablet by mouth daily 30 tablet 5   No facility-administered medications prior to visit.      Allergies:   Sulfa antibiotics and Tizanidine   Social History   Social History  . Marital status: Married    Spouse name: N/A  . Number of  children: 3  . Years of education: HS grad   Occupational History  . [employer]- bus driver- retired    Social History Main Topics  . Smoking status: Never Smoker  . Smokeless tobacco: Never Used  . Alcohol use No  . Drug use: No  . Sexual activity: No   Other Topics Concern  . Not on file   Social History Narrative   Widow, has 3 children, 4 grandchildren.   Orig from [place].  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   Caffiene, 1 cup daily avg.   No tob, no alc, no drugs.   Exercise: walking, limited by bilat hip pain.     Family History:  The patient's family history includes Coronary artery disease in her brother; Heart attack in her father; Heart attack (age of onset: 3) in her son; Ovarian cancer in her mother; Stroke (age of onset: 58) in her father.   ROS:   Please see the history of present illness.    Review of Systems  Musculoskeletal: Positive for arthritis and back pain.  All other systems reviewed and are negative.   Physical Exam:    VS:  There were no vitals taken for this visit.   GEN: Well nourished, well developed, in no acute distress  HEENT: normal  Neck: no JVD, no masses Cardiac: Normal S1/S2, RRR; no murmurs,  no edema    Respiratory:  clear to auscultation bilaterally; no wheezing, rhonchi or rales GI: soft, nontender, nondistended MS: no deformity or atrophy  Skin: warm and dry Neuro: No focal deficits  Psych: Alert and oriented x 3, normal affect  Wt Readings from Last 3 Encounters:  12/10/16 152 lb 3.2 oz (69 kg)  11/21/16 151 lb 6.4 oz (68.7 kg)  09/05/16 145 lb 12.8 oz (66.1 kg)      Studies/Labs Reviewed:     EKG:  EKG is not ordered today.   Recent Labs: 08/29/2016: BUN 13; Creatinine, Ser 0.81; Hemoglobin 13.7; Platelets 198.0; Potassium 4.9; Sodium 141 01/09/2017: ALT 14   Recent Lipid Panel    Component Value Date/Time   CHOL 185 01/09/2017 0807   CHOL 211 (H) 04/28/2014 1026    TRIG 111.0 01/09/2017 0807   TRIG 95 04/28/2014 1026   HDL 85.20 01/09/2017 0807   HDL 101 04/28/2014 1026   CHOLHDL 2 01/09/2017 0807   VLDL 22.2 01/09/2017 0807   LDLCALC 77 01/09/2017 0807   LDLCALC 91 04/28/2014 1026   LDLDIRECT 87.5 09/07/2012 1022    Additional studies/ records that were reviewed today include:   Echo 09/27/15 Mild LVH, EF 55-60%, mild AI, mild anterior MVP, mild MR, PASP 36 mmHg  Myoview 3/17 There is some interference on images likely secondary to breast tissue Overall, images appear low risk   ASSESSMENT:     No diagnosis found.  PLAN:     In order of problems listed above:  1. PAF - She is  in sinus rhythm today. Will continue Flecainide and metoprolol at current doses. Continue Eliquis.  She may proceed with her knee replacement now. She may stop Eliquis 48 hrs before surgery.  2. HTN - Controlled.   3. MVP - Mild MR by recent Echo.     4. HL - Continue Pravastatin.    5. DJD   Medication Adjustments/Labs and Tests Ordered: Current medicines are reviewed at length with the patient today.  Concerns regarding medicines are outlined above.  Medication changes, Labs and Tests ordered today are outlined in the Patient Instructions noted below. There are no Patient Instructions on file for this visit. Signed, Ernestene Coover Martinique, MD  01/18/2017 8:03 PM    [facility] [place], [place], [place]  [address] Phone: [phone]; Fax: [phone]

## 2017-01-21 ENCOUNTER — Ambulatory Visit: Payer: Medicare Other | Admitting: Cardiology

## 2017-01-27 DIAGNOSIS — M67471 Ganglion, right ankle and foot: Secondary | ICD-10-CM | POA: Diagnosis not present

## 2017-01-27 DIAGNOSIS — M67472 Ganglion, left ankle and foot: Secondary | ICD-10-CM | POA: Diagnosis not present

## 2017-01-27 DIAGNOSIS — M79671 Pain in right foot: Secondary | ICD-10-CM | POA: Diagnosis not present

## 2017-01-27 DIAGNOSIS — M79672 Pain in left foot: Secondary | ICD-10-CM | POA: Diagnosis not present

## 2017-02-05 DIAGNOSIS — G894 Chronic pain syndrome: Secondary | ICD-10-CM | POA: Diagnosis not present

## 2017-02-05 DIAGNOSIS — M47816 Spondylosis without myelopathy or radiculopathy, lumbar region: Secondary | ICD-10-CM | POA: Diagnosis not present

## 2017-02-05 DIAGNOSIS — M545 Low back pain: Secondary | ICD-10-CM | POA: Diagnosis not present

## 2017-02-05 DIAGNOSIS — M791 Myalgia: Secondary | ICD-10-CM | POA: Diagnosis not present

## 2017-02-05 DIAGNOSIS — Z79899 Other long term (current) drug therapy: Secondary | ICD-10-CM | POA: Diagnosis not present

## 2017-02-05 DIAGNOSIS — Z79891 Long term (current) use of opiate analgesic: Secondary | ICD-10-CM | POA: Diagnosis not present

## 2017-02-05 DIAGNOSIS — M4120 Other idiopathic scoliosis, site unspecified: Secondary | ICD-10-CM | POA: Diagnosis not present

## 2017-02-14 ENCOUNTER — Emergency Department (HOSPITAL_BASED_OUTPATIENT_CLINIC_OR_DEPARTMENT_OTHER)
Admission: EM | Admit: 2017-02-14 | Discharge: 2017-02-14 | Disposition: A | Discharge status: Home / Self Care | Payer: Medicare Other | Attending: Emergency Medicine | Admitting: Emergency Medicine

## 2017-02-14 ENCOUNTER — Encounter (HOSPITAL_BASED_OUTPATIENT_CLINIC_OR_DEPARTMENT_OTHER): Payer: Self-pay | Admitting: Emergency Medicine

## 2017-02-14 ENCOUNTER — Telehealth: Payer: Self-pay | Admitting: Cardiology

## 2017-02-14 ENCOUNTER — Other Ambulatory Visit: Payer: Self-pay | Admitting: Cardiology

## 2017-02-14 DIAGNOSIS — Z79899 Other long term (current) drug therapy: Secondary | ICD-10-CM | POA: Insufficient documentation

## 2017-02-14 DIAGNOSIS — Y939 Activity, unspecified: Secondary | ICD-10-CM | POA: Insufficient documentation

## 2017-02-14 DIAGNOSIS — Y999 Unspecified external cause status: Secondary | ICD-10-CM | POA: Insufficient documentation

## 2017-02-14 DIAGNOSIS — Z96653 Presence of artificial knee joint, bilateral: Secondary | ICD-10-CM | POA: Diagnosis not present

## 2017-02-14 DIAGNOSIS — S7012XA Contusion of left thigh, initial encounter: Secondary | ICD-10-CM | POA: Insufficient documentation

## 2017-02-14 DIAGNOSIS — Y929 Unspecified place or not applicable: Secondary | ICD-10-CM | POA: Insufficient documentation

## 2017-02-14 DIAGNOSIS — X58XXXA Exposure to other specified factors, initial encounter: Secondary | ICD-10-CM | POA: Insufficient documentation

## 2017-02-14 DIAGNOSIS — Z7901 Long term (current) use of anticoagulants: Secondary | ICD-10-CM | POA: Diagnosis not present

## 2017-02-14 DIAGNOSIS — Z7902 Long term (current) use of antithrombotics/antiplatelets: Secondary | ICD-10-CM | POA: Insufficient documentation

## 2017-02-14 DIAGNOSIS — I10 Essential (primary) hypertension: Secondary | ICD-10-CM | POA: Diagnosis not present

## 2017-02-14 LAB — CBC WITH DIFFERENTIAL/PLATELET
BASOS ABS: 0 10*3/uL (ref 0.0–0.1)
BASOS PCT: 0 %
EOS ABS: 0.1 10*3/uL (ref 0.0–0.7)
Eosinophils Relative: 1 %
HCT: 41 % (ref 36.0–46.0)
Hemoglobin: 13.7 g/dL (ref 12.0–15.0)
Lymphocytes Relative: 18 %
Lymphs Abs: 1.7 10*3/uL (ref 0.7–4.0)
MCH: 31.1 pg (ref 26.0–34.0)
MCHC: 33.4 g/dL (ref 30.0–36.0)
MCV: 93.2 fL (ref 78.0–100.0)
MONO ABS: 1.4 10*3/uL — AB (ref 0.1–1.0)
Monocytes Relative: 16 %
NEUTROS PCT: 65 %
Neutro Abs: 5.9 10*3/uL (ref 1.7–7.7)
Platelets: 189 10*3/uL (ref 150–400)
RBC: 4.4 MIL/uL (ref 3.87–5.11)
RDW: 14.6 % (ref 11.5–15.5)
WBC: 9 10*3/uL (ref 4.0–10.5)

## 2017-02-14 LAB — BASIC METABOLIC PANEL
Anion gap: 7 (ref 5–15)
BUN: 21 mg/dL — ABNORMAL HIGH (ref 6–20)
CALCIUM: 9.7 mg/dL (ref 8.9–10.3)
CO2: 27 mmol/L (ref 22–32)
CREATININE: 0.78 mg/dL (ref 0.44–1.00)
Chloride: 106 mmol/L (ref 101–111)
Glucose, Bld: 111 mg/dL — ABNORMAL HIGH (ref 65–99)
Potassium: 4.4 mmol/L (ref 3.5–5.1)
SODIUM: 140 mmol/L (ref 135–145)

## 2017-02-14 NOTE — ED Provider Notes (Signed)
[place] [facility] Provider Note   CSN: [id number] Arrival date & time: 02/14/17  2110     History   Chief Complaint Chief Complaint  Patient presents with  . Bleeding/Bruising    HPI Cassie White is a 76 y.o. female.  Patient is a 76 year old female with past history of atrial fibrillation on eliquis. She presents today for evaluation of a large bruise to the medial aspect of her left thigh. She noticed this earlier today. It began in the absence of any specific injury or trauma. She does report some pain in her groin with ambulation recently, however no new injuries. No chest pain or shortness of breath.   The history is provided by the patient.    Past Medical History:  Diagnosis Date  . Abnormal mammogram of left breast    Likely benign microcalcifications--repeat L diag mammo 03/24/2017  . Arthritis    DJD, back and both hips  . Chronic low back pain without sciatica    Lumbar spondylosis + scoliosis.  Summer 2017 Dr. Nelva Bush did ESI and pt got no relief.  Pt then saw Dr. Maia Petties [facility]: felt she had facet mediated pain; plan for B L345 MBB (??).  Marland Kitchen Diverticulosis    on colonoscopy 2004  . GERD (gastroesophageal reflux disease)   . Hx of cardiovascular stress test    Lexiscan Myoview (2/14):  Low risk, small ant defect likely breast attenuation, no ischemia, EF 69% (no change from 2010).    . Hyperlipidemia   . Hypertension    per pt  . Idiopathic scoliosis of thoracolumbar region   . Lumbar back pain   . Mitral regurgitation    Echo (2/14):  EF 60-65%, Gr 1 DD, mild AI, mild bileaflet MVP, mod post directed MR, mild LAE, PASP 35.  Marland Kitchen MVP (mitral valve prolapse)   . Osteoporosis, senile    stable at the left hip 2014-2015 (T-score -2.7 when she stopped bisphosphonate).  Took fosamax for 10 yrs.  Repeat DEXA 68mo later (09/2016) T-score -.3.1 (different machine, though).  Marland Kitchen PAF (paroxysmal atrial fibrillation) (HCC)    Dr. Martinique: Flecainide,  metopr, apxiaban.  . Peripheral neuropathy 2008   Idiopathic vs familial (motor>sensory): gabapentin helpful  . Shoulder pain left   RC surg 09/2014    Patient Active Problem List   Diagnosis Date Noted  . 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 Paroxysmal atrial fibrillation with rapid ventricular response ([place]) 09/28/2015  . Maxillary sinusitis, acute 08/27/2015  . Acute bronchitis 09/08/2014  . Mitral insufficiency 05/12/2014  . HTN (hypertension) 04/28/2014  . Lactic acidosis 09/07/2013  . Mitral valve prolapse 06/14/2013  . Chest pain, exertional 09/13/2012  . Pernicious anemia 09/07/2012  . DIVERTICULOSIS, COLON 09/06/2009  . Osteoporosis 09/06/2009  . Hyperlipidemia 09/04/2009  . PERIPHERAL NEUROPATHY 09/04/2009  . LOW BACK PAIN, CHRONIC 04/08/2007    Past Surgical History:  Procedure Laterality Date  . BREAST EXCISIONAL BIOPSY Right 2010   Benign  . BREAST SURGERY  Nov 2010   Benign biopsy, right  . CHOLECYSTECTOMY  03/2010   Dr.Gross  . COLONOSCOPY  10/03/2002   No polyps.  Repeat 10 yrs recommended but pt declines.  Pt declined cologuard 08/2016.  Marland Kitchen COMBINED HYSTERECTOMY VAGINAL / OOPHORECTOMY / A&P REPAIR  2951   uncertain if ovaries were removed or not  . CYSTOCELE REPAIR    . Lumpectomy  06/2009   "Fatty Necrosis"  . PFT's  2014  NORMAL  . ROTATOR CUFF REPAIR Left 10/02/14   Dr. Veverly Fells  . TONSILLECTOMY    . TOTAL KNEE ARTHROPLASTY  09/2009   Right ;Dr Alvan Dame  . TOTAL KNEE ARTHROPLASTY Left 03/10/2016   Procedure: TOTAL KNEE ARTHROPLASTY;  Surgeon: Paralee Cancel, MD;  Location: [facility];  Service: Orthopedics;  Laterality: Left;  . TRANSTHORACIC ECHOCARDIOGRAM  09/2012; 09/27/15   2014: EF 60-65%, Gr 1 DD, mild AI, mild bileaflet MVP, mod post directed MR, mild LAE, PASP 35.  Repeat 2017: EF 55-60%, mild AR, mild MVP and mild MV regurg.  Marland Kitchen VAGINAL HYSTERECTOMY     For Uterine Deviation     OB History    No data available       Home  Medications    Prior to Admission medications   Medication Sig Start Date End Date Taking? Authorizing Provider  alendronate (FOSAMAX) 70 MG tablet Take 1 tablet (70 mg total) by mouth every 7 (seven) days. Take with a full glass of water on an empty stomach. 10/02/16   [name], Adrian Blackwater, MD  apixaban (ELIQUIS) 5 MG TABS tablet Take 1 tablet (5 mg total) by mouth 2 (two) times daily. 02/19/16   Martinique, Peter M, MD  Biotin 5000 MCG CAPS Take 5,000 mcg by mouth daily.    [provider]  Calcium Carbonate (CALCIUM 600 PO) Take 600 mg by mouth 2 (two) times daily.     [provider]  cholecalciferol (VITAMIN D) 1000 UNITS tablet Take 5,000 Units by mouth daily.     [provider]  cyanocobalamin (,VITAMIN B-12,) 1000 MCG/ML injection Inject 1 mL (1,000 mcg total) into the muscle every 30 (thirty) days. 11/07/16   [name], Adrian Blackwater, MD  flecainide (TAMBOCOR) 50 MG tablet TAKE 1 TABLET (50 MG TOTAL) BY MOUTH 2 (TWO) TIMES DAILY.**PATIENT OVERDUE FOR FOLLOW UP VISIT* 12/05/16   Martinique, Peter M, MD  gabapentin (NEURONTIN) 300 MG capsule TAKE 1 CAPSULE BY MOUTH AT LUNCH, 1 AT [place], AND 2 AT BEDTIME 10/06/16   Dohmeier, Asencion Partridge, MD  HYDROcodone-acetaminophen (NORCO) 7.5-325 MG tablet Take 1-2 tablets by mouth every 4 (four) hours as needed for moderate pain. 03/11/16   Danae Orleans, PA-C  metoprolol tartrate (LOPRESSOR) 25 MG tablet Take 1 tablet (25 mg total) by mouth 2 (two) times daily. 08/29/16   [name], Adrian Blackwater, MD  nitroGLYCERIN (NITROSTAT) 0.4 MG SL tablet Place 1 tablet (0.4 mg total) under the tongue every 5 (five) minutes as needed for chest pain. 03/26/16   [name], Adrian Blackwater, MD  Omega-3 Fatty Acids (FISH OIL) 1200 MG CAPS Take 1,000 mg by mouth 2 (two) times daily.     [provider]  pravastatin (PRAVACHOL) 40 MG tablet Take 1 tablet by mouth daily 11/21/16   Almyra Deforest, PA    Family History Family History  Problem Relation Age of Onset  . Heart attack Father          in 13s  . Stroke Father 65  . Ovarian cancer Mother   . Heart attack Son 5       Smoker  . Coronary artery disease Brother   . Diabetes Neg Hx     Social History Social History  Substance Use Topics  . Smoking status: Never Smoker  . Smokeless tobacco: Never Used  . Alcohol use No     Allergies   Sulfa antibiotics and Tizanidine   Review of Systems Review of Systems  All other systems reviewed and are negative.  Physical Exam Updated Vital Signs BP 115/75   Pulse (!) 106   Temp 97.9 F (36.6 C) (Oral)   Resp (!) 23   SpO2 94%   Physical Exam  Constitutional: She is oriented to person, place, and time. She appears well-developed and well-nourished. No distress.  HENT:  Head: Normocephalic and atraumatic.  Neck: Normal range of motion. Neck supple.  Cardiovascular: Exam reveals no gallop and no friction rub.   No murmur heard. Heart is irregularly irregular.   Pulmonary/Chest: Effort normal and breath sounds normal. No respiratory distress. She has no wheezes.  Abdominal: Soft. Bowel sounds are normal. She exhibits no distension. There is no tenderness.  Musculoskeletal: Normal range of motion.  There is a hematoma to the medial aspect of the mid left thigh. There is no warmth, redness. Distal pulses easily palpable.  Neurological: She is alert and oriented to person, place, and time.  Skin: Skin is warm and dry. She is not diaphoretic.  Nursing note and vitals reviewed.    ED Treatments / Results  Labs (all labs ordered are listed, but only abnormal results are displayed) Labs Reviewed  CBC WITH DIFFERENTIAL/PLATELET  BASIC METABOLIC PANEL    EKG  EKG Interpretation None       Radiology No results found.  Procedures Procedures (including critical care time)  Medications Ordered in ED Medications - No data to display   Initial Impression / Assessment and Plan / ED Course  I have reviewed the triage vital signs and the nursing  notes.  Pertinent labs & imaging results that were available during my care of the patient were reviewed by me and considered in my medical decision making (see chart for details).  Patient presents here with complaints of a bruise to the back of her left thigh that occurred in the absence of any injury or trauma. She is taking eliquis and I suspect this bruises related to a ruptured varicose vein. She does report having some pain in her left groin for the past several days, however this hematoma is distal to where this pain is and I doubt related. Her hemoglobin is normal at 13.7.  I will advise her to apply heating pad, rest, and follow-up as needed for any problems.  Final Clinical Impressions(s) / ED Diagnoses   Final diagnoses:  None    New Prescriptions New Prescriptions   No medications on file     Veryl Speak, MD 02/14/17 2332

## 2017-02-14 NOTE — Telephone Encounter (Signed)
Patient's daughter called saying that patient has a large bruise on her inner thigh.  She is on Eliquis for PAF.  She has had a pain on her inner thigh for 4-5 days and had her daughter look at it.  She says that it is 5 x 6.5 inches in diameter on her upper thigh but there is no hardness to her thigh.  She does not recall injurying that area.  Her daughter states that she is not always reliable.  Her daughter thinks she may be back in atrial fibrillation as her pulse is irregular.  I have asked her to take her mom to [facility] ER to evaluate her leg and assess irregular rhythm.  Daughter agrees with plan

## 2017-02-14 NOTE — ED Triage Notes (Addendum)
PT presents with bruising and pain to left groin for the past few days. PT is on eliquis .

## 2017-02-14 NOTE — Discharge Instructions (Signed)
Apply heating pad intermittently for the next several days.  Continue medications as before.  Return to the emergency department if symptoms significantly worsen or change.

## 2017-02-16 ENCOUNTER — Encounter: Payer: Self-pay | Admitting: Family Medicine

## 2017-02-16 ENCOUNTER — Ambulatory Visit (INDEPENDENT_AMBULATORY_CARE_PROVIDER_SITE_OTHER): Payer: Medicare Other | Admitting: Family Medicine

## 2017-02-16 VITALS — BP 117/70 | HR 61 | Temp 97.8°F | Resp 20 | Wt 151.2 lb

## 2017-02-16 DIAGNOSIS — T148XXA Other injury of unspecified body region, initial encounter: Secondary | ICD-10-CM

## 2017-02-16 DIAGNOSIS — Z9229 Personal history of other drug therapy: Secondary | ICD-10-CM

## 2017-02-16 LAB — CBC WITH DIFFERENTIAL/PLATELET
BASOS ABS: 0 {cells}/uL (ref 0–200)
Basophils Relative: 0 %
Eosinophils Absolute: 56 cells/uL (ref 15–500)
Eosinophils Relative: 1 %
HCT: 38.8 % (ref 35.0–45.0)
Hemoglobin: 12.9 g/dL (ref 11.7–15.5)
LYMPHS PCT: 27 %
Lymphs Abs: 1512 cells/uL (ref 850–3900)
MCH: 31.2 pg (ref 27.0–33.0)
MCHC: 33.2 g/dL (ref 32.0–36.0)
MCV: 93.7 fL (ref 80.0–100.0)
MONO ABS: 672 {cells}/uL (ref 200–950)
MPV: 13.4 fL — ABNORMAL HIGH (ref 7.5–12.5)
Monocytes Relative: 12 %
NEUTROS PCT: 60 %
Neutro Abs: 3360 cells/uL (ref 1500–7800)
Platelets: 204 10*3/uL (ref 140–400)
RBC: 4.14 MIL/uL (ref 3.80–5.10)
RDW: 13.8 % (ref 11.0–15.0)
WBC: 5.6 10*3/uL (ref 3.8–10.8)

## 2017-02-16 NOTE — Patient Instructions (Signed)
We will check your blood work today.   If any symptoms pain, shortness of breath, chest pain, dizziness then please be seen urgently in ED.    Hematoma A hematoma is a collection of blood. The collection of blood can turn into a hard, painful lump under the skin. Your skin may turn blue or yellow if the hematoma is close to the surface of the skin. Most hematomas get better in a few days to weeks. Some hematomas are serious and need medical care. Hematomas can be very small or very big. Follow these instructions at home:  Apply ice to the injured area: ? Put ice in a plastic bag. ? Place a towel between your skin and the bag. ? Leave the ice on for 20 minutes, 2-3 times a day for the first 1 to 2 days.  After the first 2 days, switch to using warm packs on the injured area.  Raise (elevate) the injured area to lessen pain and puffiness (swelling). You may also wrap the area with an elastic bandage. Make sure the bandage is not wrapped too tight.  If you have a painful hematoma on your leg or foot, you may use crutches for a couple days.  Only take medicines as told by your doctor. Get help right away if:  Your pain gets worse.  Your pain is not controlled with medicine.  You have a fever.  Your puffiness gets worse.  Your skin turns more blue or yellow.  Your skin over the hematoma breaks or starts bleeding.  Your hematoma is in your chest or belly (abdomen) and you are short of breath, feel weak, or have a change in consciousness.  Your hematoma is on your scalp and you have a headache that gets worse or a change in alertness or consciousness. This information is not intended to replace advice given to you by your health care provider. Make sure you discuss any questions you have with your health care provider. Document Released: 08/28/2004 Document Revised: 12/27/2015 Document Reviewed: 12/29/2012 Elsevier Interactive Patient Education  2017 [employer].

## 2017-02-16 NOTE — Progress Notes (Signed)
Cassie White , 1940/09/27, 76 y.o., female MRN: [id number] Patient Care Team    Relationship Specialty Notifications Start End  [name], Adrian Blackwater, MD PCP - General Family Medicine  12/25/14   Martinique, Peter M, MD Consulting Physician Cardiology  06/22/15   Dohmeier, Asencion Partridge, MD Consulting Physician Neurology  06/22/15   Suella Broad, MD Consulting Physician Physical Medicine and Rehabilitation  12/07/15   Zonia Kief, MD Consulting Physician Rehabilitation  07/24/16    Comment: [facility]  Starling Manns, MD  Orthopedic Surgery  08/08/16   Ward Givens, NP Consulting Physician Gerontology  12/11/16    Comment: [facility]    Chief Complaint  Patient presents with  . hematoma    left thigh x 2 days ago      Subjective: Pt presents for an OV with complaints of hematoma of medial left thigh since Saturday (3 days).  Associated symptoms include nothing. She does not recall any injury to the area. She denies pain or fever. She was seen in the ED Saturday after she saw the hematoma in the mirror. She is uncertain how long it has been there. She is on eliquis for A.fib. She has been using a heating pad to help with hematoma. She has also noticed some discomfort higher up in inguinal area, not near the area of concern. She endorses possibly mild intermittent dizziness. Her daughter is with her today and is concerned secondary to the extension of the discoloration. Initially she states it was approximately 5.5 x 6.5", and now the bruising appearance is extended to her knee. Patient states if she did not see in the mirror, she would've never done it was there it is not causing her any issues. She's been up moving around performing her normal routine since being seen in the emergency room. She states she is very active.   Depression screen [facility] 2/9 08/08/2016 06/22/2015 08/07/2014 09/07/2012  Decreased Interest 0 0 0 0  Down, Depressed, Hopeless 0 0 0 1  PHQ - 2 Score 0 0  0 1    Allergies  Allergen Reactions  . Sulfa Antibiotics Swelling    face  . Tizanidine Other (See Comments)    delirium   Social History  Substance Use Topics  . Smoking status: Never Smoker  . Smokeless tobacco: Never Used  . Alcohol use No   Past Medical History:  Diagnosis Date  . Abnormal mammogram of left breast    Likely benign microcalcifications--repeat L diag mammo 03/24/2017  . Arthritis    DJD, back and both hips  . Chronic low back pain without sciatica    Lumbar spondylosis + scoliosis.  Summer 2017 Dr. Nelva Bush did ESI and pt got no relief.  Pt then saw Dr. Maia Petties [facility]: felt she had facet mediated pain; plan for B L345 MBB (??).  Marland Kitchen Diverticulosis    on colonoscopy 2004  . GERD (gastroesophageal reflux disease)   . Hx of cardiovascular stress test    Lexiscan Myoview (2/14):  Low risk, small ant defect likely breast attenuation, no ischemia, EF 69% (no change from 2010).    . Hyperlipidemia   . Hypertension    per pt  . Idiopathic scoliosis of thoracolumbar region   . Lumbar back pain   . Mitral regurgitation    Echo (2/14):  EF 60-65%, Gr 1 DD, mild AI, mild bileaflet MVP, mod post directed MR, mild LAE, PASP 35.  Marland Kitchen MVP (mitral valve prolapse)   .  Osteoporosis, senile    stable at the left hip 2014-2015 (T-score -2.7 when she stopped bisphosphonate).  Took fosamax for 10 yrs.  Repeat DEXA 21mo later (09/2016) T-score -.3.1 (different machine, though).  Marland Kitchen PAF (paroxysmal atrial fibrillation) (HCC)    Dr. Martinique: Flecainide, metopr, apxiaban.  . Peripheral neuropathy 2008   Idiopathic vs familial (motor>sensory): gabapentin helpful  . Shoulder pain left   RC surg 09/2014   Past Surgical History:  Procedure Laterality Date  . BREAST EXCISIONAL BIOPSY Right 2010   Benign  . BREAST SURGERY  Nov 2010   Benign biopsy, right  . CHOLECYSTECTOMY  03/2010   Dr.Gross  . COLONOSCOPY  10/03/2002   No polyps.  Repeat 10 yrs recommended but pt  declines.  Pt declined cologuard 08/2016.  Marland Kitchen COMBINED HYSTERECTOMY VAGINAL / OOPHORECTOMY / A&P REPAIR  7017   uncertain if ovaries were removed or not  . CYSTOCELE REPAIR    . Lumpectomy  06/2009   "Fatty Necrosis"  . PFT's  2014   NORMAL  . ROTATOR CUFF REPAIR Left 10/02/14   Dr. Veverly Fells  . TONSILLECTOMY    . TOTAL KNEE ARTHROPLASTY  09/2009   Right ;Dr Alvan Dame  . TOTAL KNEE ARTHROPLASTY Left 03/10/2016   Procedure: TOTAL KNEE ARTHROPLASTY;  Surgeon: Paralee Cancel, MD;  Location: [facility];  Service: Orthopedics;  Laterality: Left;  . TRANSTHORACIC ECHOCARDIOGRAM  09/2012; 09/27/15   2014: EF 60-65%, Gr 1 DD, mild AI, mild bileaflet MVP, mod post directed MR, mild LAE, PASP 35.  Repeat 2017: EF 55-60%, mild AR, mild MVP and mild MV regurg.  Marland Kitchen VAGINAL HYSTERECTOMY     For Uterine Deviation    Family History  Problem Relation Age of Onset  . Heart attack Father        in 51s  . Stroke Father 71  . Ovarian cancer Mother   . Heart attack Son 55       Smoker  . Coronary artery disease Brother   . Diabetes Neg Hx    Allergies as of 02/16/2017      Reactions   Sulfa Antibiotics Swelling   face   Tizanidine Other (See Comments)   delirium      Medication List       Accurate as of 02/16/17  3:05 PM. Always use your most recent med list.          alendronate 70 MG tablet Commonly known as:  FOSAMAX Take 1 tablet (70 mg total) by mouth every 7 (seven) days. Take with a full glass of water on an empty stomach.   apixaban 5 MG Tabs tablet Commonly known as:  ELIQUIS Take 1 tablet (5 mg total) by mouth 2 (two) times daily.   Biotin 5000 MCG Caps Take 5,000 mcg by mouth daily.   CALCIUM 600 PO Take 600 mg by mouth 2 (two) times daily.   cholecalciferol 1000 units tablet Commonly known as:  VITAMIN D Take 5,000 Units by mouth daily.   cyanocobalamin 1000 MCG/ML injection Commonly known as:  (VITAMIN B-12) Inject 1 mL (1,000 mcg total) into the muscle every 30 (thirty) days.     Fish Oil 1200 MG Caps Take 1,000 mg by mouth 2 (two) times daily.   flecainide 50 MG tablet Commonly known as:  TAMBOCOR TAKE 1 TABLET (50 MG TOTAL) BY MOUTH 2 (TWO) TIMES DAILY.**PATIENT OVERDUE FOR FOLLOW UP VISIT*   gabapentin 300 MG capsule Commonly known as:  NEURONTIN TAKE 1 CAPSULE BY MOUTH  AT LUNCH, 1 AT DINNER, AND 2 AT BEDTIME   HYDROcodone-acetaminophen 7.5-325 MG tablet Commonly known as:  NORCO Take 1-2 tablets by mouth every 4 (four) hours as needed for moderate pain.   metoprolol tartrate 25 MG tablet Commonly known as:  LOPRESSOR Take 1 tablet (25 mg total) by mouth 2 (two) times daily.   nitroGLYCERIN 0.4 MG SL tablet Commonly known as:  NITROSTAT Place 1 tablet (0.4 mg total) under the tongue every 5 (five) minutes as needed for chest pain.   pravastatin 40 MG tablet Commonly known as:  PRAVACHOL Take 1 tablet by mouth daily       All past medical history, surgical history, allergies, family history, immunizations andmedications were updated in the EMR today and reviewed under the history and medication portions of their EMR.     ROS: Negative, with the exception of above mentioned in HPI   Objective:  BP 117/70 (BP Location: Left Arm, Patient Position: Sitting, Cuff Size: Normal)   Pulse 61   Temp 97.8 F (36.6 C)   Resp 20   Wt 151 lb 4 oz (68.6 kg)   SpO2 97%   BMI 28.58 kg/m  Body mass index is 28.58 kg/m. Gen: Afebrile. No acute distress. Nontoxic in appearance, well developed, well nourished. Very pleasant Caucasian female. HENT: AT. [place].  MMM, no oral lesions.  Eyes:Pupils Equal Round Reactive to light, Extraocular movements intact,  Conjunctiva without redness, discharge or icterus. CV: Irregularly irregular Chest: CTAB, no wheeze or crackles. Good air movement, normal resp effort.  MSK: Hematoma medial aspect of upper thigh. With purple/bruising discoloration extending to just superior of medial knee. No warmth, no redness, no  tenderness. Distal pulses equal bilaterally. Neuro:  Normal gait. PERLA. EOMi. Alert. Oriented x3   No exam data present No results found. No results found for this or any previous visit (from the past 24 hour(s)).  Assessment/Plan: Cassie White is a 76 y.o. female present for OV for  Hematoma Hx of long term use of blood thinners - Patient is asymptomatic. Perform her normal routine. Vitals are stable.  - Actual hematoma feels to be mostly located up in the medial upper thigh. Given that she is rather asymptomatic, this may actually just be bruising/discoloration secondary to gravity and not extension of actual hematoma. Discussed with her  emergency symptoms and to seek immediate treatment in the emergency room if she experiences. Will recheck the CBC today to see if there is additional blood loss. If signs of additional bleeding are present, will need to image, and discuss her anticoagulation.  - CBC w/Diff - f/u 1 week with pcp, sooner if worsening   Reviewed expectations re: course of current medical issues.  Discussed self-management of symptoms.  Outlined signs and symptoms indicating need for more acute intervention.  Patient verbalized understanding and all questions were answered.  Patient received an After-Visit Summary.    No orders of the defined types were placed in this encounter.    Note is dictated utilizing voice recognition software. Although note has been proof read prior to signing, occasional typographical errors still can be missed. If any questions arise, please do not hesitate to call for verification.   electronically signed by:  Howard Pouch, DO  [place]

## 2017-02-17 ENCOUNTER — Telehealth: Payer: Self-pay | Admitting: Family Medicine

## 2017-02-17 NOTE — Telephone Encounter (Signed)
Patient notified and verbalized understanding. 

## 2017-02-17 NOTE — Telephone Encounter (Signed)
Please call pt: - her labs are stable from collection in the ED.  - She can use a light compression over the upper thigh (area of original spot) with a ace bandage. Try to keep legs elevated when at rest, not over do it with activity, but should be able to continue a normal daily routine. Drink plenty of water and stay hydrated and use heating pad when able.  - follow up before the weekend if not seeing improvment, or sooner if leg swelling, pain, dizziness or fever presents.

## 2017-02-24 DIAGNOSIS — M67471 Ganglion, right ankle and foot: Secondary | ICD-10-CM | POA: Diagnosis not present

## 2017-02-24 DIAGNOSIS — R601 Generalized edema: Secondary | ICD-10-CM | POA: Diagnosis not present

## 2017-03-02 ENCOUNTER — Ambulatory Visit: Payer: Medicare Other | Admitting: Student

## 2017-03-10 DIAGNOSIS — M47816 Spondylosis without myelopathy or radiculopathy, lumbar region: Secondary | ICD-10-CM | POA: Diagnosis not present

## 2017-03-10 DIAGNOSIS — M545 Low back pain: Secondary | ICD-10-CM | POA: Diagnosis not present

## 2017-03-10 DIAGNOSIS — M4120 Other idiopathic scoliosis, site unspecified: Secondary | ICD-10-CM | POA: Diagnosis not present

## 2017-03-17 ENCOUNTER — Telehealth: Payer: Self-pay | Admitting: Family Medicine

## 2017-03-17 NOTE — Telephone Encounter (Signed)
Patient got emmi call today and would like to get cologuard ordered.  Please advise.

## 2017-03-18 ENCOUNTER — Encounter: Payer: Self-pay | Admitting: Family Medicine

## 2017-03-18 NOTE — Telephone Encounter (Signed)
Yes, cologuard OK to give her.  Dx is colon cancer screening.-thx

## 2017-03-18 NOTE — Telephone Encounter (Signed)
Order placed via Cologuard portal.

## 2017-03-18 NOTE — Telephone Encounter (Signed)
Please advise. Thanks.  

## 2017-03-18 NOTE — Telephone Encounter (Signed)
Maudie Mercury will you please send in the order. Thanks.

## 2017-03-18 NOTE — Telephone Encounter (Signed)
Pt advised and voiced understanding.   

## 2017-03-19 ENCOUNTER — Ambulatory Visit (INDEPENDENT_AMBULATORY_CARE_PROVIDER_SITE_OTHER): Payer: Medicare Other | Admitting: Physician Assistant

## 2017-03-19 ENCOUNTER — Encounter: Payer: Self-pay | Admitting: Physician Assistant

## 2017-03-19 VITALS — BP 122/62 | HR 49 | Ht 61.0 in | Wt 151.2 lb

## 2017-03-19 DIAGNOSIS — E785 Hyperlipidemia, unspecified: Secondary | ICD-10-CM

## 2017-03-19 DIAGNOSIS — I480 Paroxysmal atrial fibrillation: Secondary | ICD-10-CM | POA: Diagnosis not present

## 2017-03-19 DIAGNOSIS — I340 Nonrheumatic mitral (valve) insufficiency: Secondary | ICD-10-CM | POA: Diagnosis not present

## 2017-03-19 NOTE — Progress Notes (Signed)
Cardiology Office Note    Date:  03/21/2017   ID:  Cassie White, DOB 1941/07/31, MRN [id number]  PCP:  Tammi Sou, MD  Cardiologist:  Dr. Martinique  Chief Complaint  Patient presents with  . Follow-up    seen for Dr. Martinique.    History of Present Illness:  Cassie White is a 76 y.o. female with PMH of MVP, MR, HLD, GERD and PAF. She had a Myoview in 2010 with anterior breast attenuation, otherwise normal. Review obtained in February 2014 was low risk, small anterior defect likely breast attenuation, however no ischemia, EF 69%. Echocardiogram obtained in February 2014 showed EF 49-67%, grade 1 diastolic dysfunction, moderate posterior directed MR, PASP 35 mmHg. Last echocardiogram obtained on 09/27/2015 showed EF 55-60%, mild AR, mild MR, PA peak pressure 36 mmHg. Patient was noted to be in atrial fibrillation during the study. She was placed on eliquis and flecainide and subsequently went back to sinus rhythm. She underwent Myoview on 10/18/2015 to rule out pro arrhythmia and the ischemic heart disease, there was moderate sized and intensity partially reversible anterior and apical defect, EF was 64%, this is likely representing shifting breast artifact, overall it was consider intermediate risk study. The study was reviewed by Dr. Martinique, no further testing was felt to be needed, the image was consider low risk.  I last saw the patient on 11/21/2016 for preoperative clearance, she was doing well at the time, she was cleared to proceed with back surgery.  She is on Pravachol, fasting lipid panel obtained on 01/09/2017 showed cholesterol 185, HDL 85, LDL 77, triglyceride 111. She did go to the hospital on 02/14/2017 with a large bruise on the medial aspect of the left thigh. This occurred in the absence of trauma. She was on eliquis, it was suspected that she may have had a ruptured varicose vein. This was managed conservatively. I do not see any image that was obtained. EKG obtained in the ED  that they did show atrial fibrillation with RVR.  Patient presents today for cardiology office follow-up. She is currently in sinus bradycardia with first-degree AV block, heart rate 49. She denies any dizziness, chest discomfort, shortness of breath, orthopnea or PND. She has chronic back pain and cannot walk very far. She may end up needing back surgery. Given the fact that her echocardiogram and Myoview looks good last year, I would not recommend any additional workup if she were to have surgery in the next few month. Otherwise, my main concern is her heart rate when she has recurrent atrial fibrillation. Despite that fact she has sinus bradycardia when she is use normal rhythm, however every time she go back into atrial fibrillation, her heart rate is mildly elevated in the 110 to 120s. Due to baseline bradycardia, I am unable to increase the metoprolol level. We discussed the various options including other antiarrhythmic drugs versus increasing flecainide 200 mg twice a day. She wished to hold off on that. She will check her pulse at least twice a week, and let us know you've she has prolonged episode of atrial fibrillation. If so, I would be in favor of increasing flecainide to 100 mg twice a day or even to 150 mg twice daily if needed. Otherwise she can follow-up in 4-5 month period   Past Medical History:  Diagnosis Date  . Abnormal mammogram of left breast    Likely benign microcalcifications--repeat L diag mammo 03/24/2017  . Arthritis    DJD, back and  both hips  . Chronic low back pain without sciatica    Lumbar spondylosis + scoliosis.  Summer 2017 Dr. Nelva Bush did ESI and pt got no relief.  Pt then saw Dr. Maia Petties [facility]: felt she had facet mediated pain; plan for B L345 MBB (??).  Marland Kitchen Diverticulosis    on colonoscopy 2004  . GERD (gastroesophageal reflux disease)   . Hx of cardiovascular stress test    Lexiscan Myoview (2/14):  Low risk, small ant defect likely breast  attenuation, no ischemia, EF 69% (no change from 2010).    . Hyperlipidemia   . Hypertension    per pt  . Idiopathic scoliosis of thoracolumbar region   . Lumbar back pain   . Mitral regurgitation    Echo (2/14):  EF 60-65%, Gr 1 DD, mild AI, mild bileaflet MVP, mod post directed MR, mild LAE, PASP 35.  Marland Kitchen MVP (mitral valve prolapse)   . Osteoporosis, senile    stable at the left hip 2014-2015 (T-score -2.7 when she stopped bisphosphonate).  Took fosamax for 10 yrs.  Repeat DEXA 32mo later (09/2016) T-score -.3.1 (different machine, though).  Marland Kitchen PAF (paroxysmal atrial fibrillation) ([facility])    Dr. Martinique: Flecainide, metopr, apxiaban.  . Peripheral neuropathy 2008   Idiopathic vs familial (motor>sensory): gabapentin helpful  . Shoulder pain left   RC surg 09/2014    Past Surgical History:  Procedure Laterality Date  . BREAST EXCISIONAL BIOPSY Right 2010   Benign  . BREAST SURGERY  Nov 2010   Benign biopsy, right  . CHOLECYSTECTOMY  03/2010   Dr.Gross  . COLONOSCOPY  10/03/2002   No polyps.  Repeat 10 yrs recommended but pt declines.  Pt declined cologuard 08/2016 but changed her mind 03/2017.  Marland Kitchen COMBINED HYSTERECTOMY VAGINAL / OOPHORECTOMY / A&P REPAIR  6294   uncertain if ovaries were removed or not  . CYSTOCELE REPAIR    . Lumpectomy  06/2009   "Fatty Necrosis"  . PFT's  2014   NORMAL  . ROTATOR CUFF REPAIR Left 10/02/14   Dr. Veverly Fells  . TONSILLECTOMY    . TOTAL KNEE ARTHROPLASTY  09/2009   Right ;Dr Alvan Dame  . TOTAL KNEE ARTHROPLASTY Left 03/10/2016   Procedure: TOTAL KNEE ARTHROPLASTY;  Surgeon: Paralee Cancel, MD;  Location: [facility];  Service: Orthopedics;  Laterality: Left;  . TRANSTHORACIC ECHOCARDIOGRAM  09/2012; 09/27/15   2014: EF 60-65%, Gr 1 DD, mild AI, mild bileaflet MVP, mod post directed MR, mild LAE, PASP 35.  Repeat 2017: EF 55-60%, mild AR, mild MVP and mild MV regurg.  Marland Kitchen VAGINAL HYSTERECTOMY     For Uterine Deviation     Current Medications: Outpatient Medications  Prior to Visit  Medication Sig Dispense Refill  . Biotin 5000 MCG CAPS Take 5,000 mcg by mouth daily.    . Calcium Carbonate (CALCIUM 600 PO) Take 600 mg by mouth 2 (two) times daily.     . cholecalciferol (VITAMIN D) 1000 UNITS tablet Take 5,000 Units by mouth daily.     . cyanocobalamin (,VITAMIN B-12,) 1000 MCG/ML injection Inject 1 mL (1,000 mcg total) into the muscle every 30 (thirty) days. 10 mL 1  . ELIQUIS 5 MG TABS tablet TAKE 1 TABLET (5 MG TOTAL) BY MOUTH 2 (TWO) TIMES DAILY. 180 tablet 1  . flecainide (TAMBOCOR) 50 MG tablet TAKE 1 TABLET (50 MG TOTAL) BY MOUTH 2 (TWO) TIMES DAILY.**PATIENT OVERDUE FOR FOLLOW UP VISIT* 30 tablet 5  . gabapentin (NEURONTIN) 300 MG capsule  TAKE 1 CAPSULE BY MOUTH AT LUNCH, 1 AT DINNER, AND 2 AT BEDTIME 360 capsule 3  . HYDROcodone-acetaminophen (NORCO) 7.5-325 MG tablet Take 1-2 tablets by mouth every 4 (four) hours as needed for moderate pain. 100 tablet 0  . metoprolol tartrate (LOPRESSOR) 25 MG tablet Take 1 tablet (25 mg total) by mouth 2 (two) times daily. 180 tablet 3  . nitroGLYCERIN (NITROSTAT) 0.4 MG SL tablet Place 1 tablet (0.4 mg total) under the tongue every 5 (five) minutes as needed for chest pain. 25 tablet 5  . Omega-3 Fatty Acids (FISH OIL) 1200 MG CAPS Take 1,000 mg by mouth 2 (two) times daily.     . pravastatin (PRAVACHOL) 40 MG tablet Take 1 tablet by mouth daily 30 tablet 5  . alendronate (FOSAMAX) 70 MG tablet Take 1 tablet (70 mg total) by mouth every 7 (seven) days. Take with a full glass of water on an empty stomach. 12 tablet 3   No facility-administered medications prior to visit.      Allergies:   Sulfa antibiotics and Tizanidine   Social History   Social History  . Marital status: Married    Spouse name: N/A  . Number of children: 3  . Years of education: HS grad   Occupational History  . [employer]- bus driver- retired    Social History Main Topics  . Smoking status: Never Smoker  . Smokeless tobacco:  Never Used  . Alcohol use No  . Drug use: No  . Sexual activity: No   Other Topics Concern  . None   Social History Narrative   Widow, has 3 children, 4 grandchildren.   Orig from [place].   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   Caffiene, 1 cup daily avg.   No tob, no alc, no drugs.   Exercise: walking, limited by bilat hip pain.     Family History:  The patient's family history includes Coronary artery disease in her brother; Heart attack in her father; Heart attack (age of onset: 49) in her son; Ovarian cancer in her mother; Stroke (age of onset: 4) in her father.   ROS:   Please see the history of present illness.    ROS All other systems reviewed and are negative.   PHYSICAL EXAM:   VS:  BP 122/62   Pulse (!) 49   Ht 5\' 1"  (1.549 m)   Wt 151 lb 3.2 oz (68.6 kg)   BMI 28.57 kg/m    GEN: Well nourished, well developed, in no acute distress  HEENT: normal  Neck: no JVD, carotid bruits, or masses Cardiac: RRR; no murmurs, rubs, or gallops,no edema  Respiratory:  clear to auscultation bilaterally, normal work of breathing GI: soft, nontender, nondistended, + BS MS: no deformity or atrophy  Skin: warm and dry, no rash Neuro:  Alert and Oriented x 3, Strength and sensation are intact Psych: euthymic mood, full affect  Wt Readings from Last 3 Encounters:  03/19/17 151 lb 3.2 oz (68.6 kg)  02/16/17 151 lb 4 oz (68.6 kg)  12/10/16 152 lb 3.2 oz (69 kg)      Studies/Labs Reviewed:   EKG:  EKG is ordered today.  The ekg ordered today demonstrates Sinus bradycardia, first-degree AV block, heart rate 49  Recent Labs: 01/09/2017: ALT 14 02/14/2017: BUN 21; Creatinine, Ser 0.78; Potassium 4.4; Sodium 140 02/16/2017: Hemoglobin 12.9; Platelets 204   Lipid Panel    Component Value Date/Time   CHOL  185 01/09/2017 0807   CHOL 211 (H) 04/28/2014 1026   TRIG 111.0 01/09/2017 0807   TRIG 95 04/28/2014 1026   HDL 85.20 01/09/2017 0807    HDL 101 04/28/2014 1026   CHOLHDL 2 01/09/2017 0807   VLDL 22.2 01/09/2017 0807   LDLCALC 77 01/09/2017 0807   LDLCALC 91 04/28/2014 1026   LDLDIRECT 87.5 09/07/2012 1022    Additional studies/ records that were reviewed today include:   Echo 09/27/2015 LV EF: 55% -   60%  Study Conclusions  - Left ventricle: The cavity size was normal. There was mild   concentric hypertrophy. Systolic function was normal. The   estimated ejection fraction was in the range of 55% to 60%. - Aortic valve: There was mild regurgitation. - Mitral valve: Mild anterior leafelt prolapse. There was mild   regurgitation. - Atrial septum: No defect or patent foramen ovale was identified. - Pulmonary arteries: PA peak pressure: 36 mm Hg (S).   Myoview 10/18/2015 Study Highlights    The left ventricular ejection fraction is moderately decreased (30-44%).  Nuclear stress EF: 64%.  There was no ST segment deviation noted during stress.  No T wave inversion was noted during stress.  Defect 1: There is a medium defect of moderate severity.  This is an intermediate risk study.  Findings consistent with prior myocardial infarction with peri-infarct ischemia.   Moderate size and intensity partially reversible anterior and apical wall defect (SDS 4). LVEF 64% with normal wall motion. This likely represents shifting breast artifact given normal wall motion and LV function. Clinical correlation is advised. This is an intermediate risk study.      ASSESSMENT:    1. PAF (paroxysmal atrial fibrillation) ([place])   2. Hyperlipidemia, unspecified hyperlipidemia type   3. Moderate mitral regurgitation      PLAN:  In order of problems listed above:  1. PAF: Currently on flecainide, metoprolol and eliquis. Recently had hematoma of the left thigh, this has healed on physical exam. EKG obtained in the ED showed atrial fibrillation with mildly elevated heart rate, today's EKG showed started went back into  sinus rhythm. She does not have significant cardiac awareness. We discussed various options including ocular off for now. If she does have frequent recurrences, I plan to increase flecainide. She is bradycardic on today's EKG, however when she goes into atrial fibrillation, her heart rate tended to be on the higher side. I am unable to up titrate metoprolol given Baseline sinus bradycardia.  2. Hyperlipidemia: Last fasting lipid panel and LFTs in June 2018, cholesterol 185, HDL 85, LDL 77, triglyceride 111. Well-controlled  3. Moderate mitral regurgitation: Continue to monitor    Medication Adjustments/Labs and Tests Ordered: Current medicines are reviewed at length with the patient today.  Concerns regarding medicines are outlined above.  Medication changes, Labs and Tests ordered today are listed in the Patient Instructions below. Patient Instructions  Medication Instructions:  Your physician recommends that you continue on your current medications as directed. Please refer to the Current Medication list given to you today.  If you need a refill on your cardiac medications before your next appointment, please call your pharmacy.  Follow-Up: Your physician wants you to follow-up in: 4-5 MONTHS WITH DR Martinique.    Thank you for choosing [facility] at [employer], [place]  03/21/2017 10:26 AM    [address], [place], [place]  [address] Phone: [phone]; Fax: [phone]

## 2017-03-19 NOTE — Patient Instructions (Signed)
Medication Instructions:  Your physician recommends that you continue on your current medications as directed. Please refer to the Current Medication list given to you today.  If you need a refill on your cardiac medications before your next appointment, please call your pharmacy.  Follow-Up: Your physician wants you to follow-up in: 4-5 MONTHS WITH DR Martinique.    Thank you for choosing [facility] at [facility]!!

## 2017-03-20 ENCOUNTER — Other Ambulatory Visit: Payer: Self-pay | Admitting: Cardiology

## 2017-03-20 DIAGNOSIS — I480 Paroxysmal atrial fibrillation: Secondary | ICD-10-CM

## 2017-03-21 ENCOUNTER — Encounter: Payer: Self-pay | Admitting: Physician Assistant

## 2017-03-23 DIAGNOSIS — Z1211 Encounter for screening for malignant neoplasm of colon: Secondary | ICD-10-CM | POA: Diagnosis not present

## 2017-03-23 DIAGNOSIS — Z1212 Encounter for screening for malignant neoplasm of rectum: Secondary | ICD-10-CM | POA: Diagnosis not present

## 2017-03-23 LAB — COLOGUARD: COLOGUARD: NEGATIVE

## 2017-03-23 NOTE — Telephone Encounter (Signed)
Rx(s) sent to pharmacy electronically.  

## 2017-03-26 ENCOUNTER — Other Ambulatory Visit: Payer: Self-pay | Admitting: Family Medicine

## 2017-03-26 DIAGNOSIS — R921 Mammographic calcification found on diagnostic imaging of breast: Secondary | ICD-10-CM

## 2017-04-03 ENCOUNTER — Other Ambulatory Visit: Payer: Self-pay | Admitting: Family Medicine

## 2017-04-03 ENCOUNTER — Ambulatory Visit
Admission: RE | Admit: 2017-04-03 | Discharge: 2017-04-03 | Disposition: A | Discharge status: Home / Self Care | Payer: Medicare Other | Source: Ambulatory Visit | Attending: Family Medicine | Admitting: Family Medicine

## 2017-04-03 DIAGNOSIS — R921 Mammographic calcification found on diagnostic imaging of breast: Secondary | ICD-10-CM | POA: Diagnosis not present

## 2017-04-10 ENCOUNTER — Ambulatory Visit
Admission: RE | Admit: 2017-04-10 | Discharge: 2017-04-10 | Disposition: A | Discharge status: Home / Self Care | Payer: Medicare Other | Source: Ambulatory Visit | Attending: Family Medicine | Admitting: Family Medicine

## 2017-04-10 ENCOUNTER — Encounter: Payer: Self-pay | Admitting: Family Medicine

## 2017-04-10 DIAGNOSIS — R921 Mammographic calcification found on diagnostic imaging of breast: Secondary | ICD-10-CM | POA: Diagnosis not present

## 2017-04-10 DIAGNOSIS — N6489 Other specified disorders of breast: Secondary | ICD-10-CM | POA: Diagnosis not present

## 2017-04-10 HISTORY — PX: BREAST BIOPSY: SHX20

## 2017-04-13 ENCOUNTER — Encounter: Payer: Self-pay | Admitting: Family Medicine

## 2017-04-14 ENCOUNTER — Encounter: Payer: Self-pay | Admitting: Family Medicine

## 2017-04-16 ENCOUNTER — Other Ambulatory Visit: Payer: Self-pay | Admitting: Physician Assistant

## 2017-04-16 NOTE — Telephone Encounter (Signed)
Please review for refill, Thanks !  

## 2017-04-16 NOTE — Telephone Encounter (Signed)
Rx(s) sent to pharmacy electronically.  

## 2017-04-20 DIAGNOSIS — M47816 Spondylosis without myelopathy or radiculopathy, lumbar region: Secondary | ICD-10-CM | POA: Diagnosis not present

## 2017-04-20 DIAGNOSIS — M4120 Other idiopathic scoliosis, site unspecified: Secondary | ICD-10-CM | POA: Diagnosis not present

## 2017-04-23 DIAGNOSIS — D485 Neoplasm of uncertain behavior of skin: Secondary | ICD-10-CM | POA: Diagnosis not present

## 2017-04-23 DIAGNOSIS — L570 Actinic keratosis: Secondary | ICD-10-CM | POA: Diagnosis not present

## 2017-04-23 DIAGNOSIS — C44319 Basal cell carcinoma of skin of other parts of face: Secondary | ICD-10-CM | POA: Diagnosis not present

## 2017-04-24 ENCOUNTER — Encounter: Payer: Self-pay | Admitting: Family Medicine

## 2017-04-26 ENCOUNTER — Encounter: Payer: Self-pay | Admitting: Family Medicine

## 2017-04-26 ENCOUNTER — Telehealth: Payer: Self-pay | Admitting: Family Medicine

## 2017-04-26 NOTE — Telephone Encounter (Signed)
Pls notify pt that her Cologuard test came back negative. This is good.  We'll repeat this in 3 yrs if she desires further colon cancer screening at that time.

## 2017-04-27 NOTE — Telephone Encounter (Signed)
Pt advised and voiced understanding.   

## 2017-04-30 DIAGNOSIS — N6489 Other specified disorders of breast: Secondary | ICD-10-CM | POA: Diagnosis not present

## 2017-05-06 ENCOUNTER — Telehealth: Payer: Self-pay | Admitting: Cardiology

## 2017-05-06 NOTE — Telephone Encounter (Signed)
Spoke to triage RN at [facility].She stated patient has a breast problem.Patient wants to monitor and think about surgery.They will fax request when she decides on surgery.

## 2017-05-06 NOTE — Telephone Encounter (Signed)
She wanted to know if pt will need to be seen before her surgery since she is on Eliquis?he also wanted you to know she faxex over the surgical clearnance.

## 2017-05-07 ENCOUNTER — Telehealth: Payer: Self-pay

## 2017-05-07 NOTE — Telephone Encounter (Signed)
   [facility] Pre-operative Risk Assessment    Request for surgical clearance:  1. What type of surgery is being performed- Left Breast Seed Guided Excisional Bx.  2. When is this surgery scheduled-not scheduled yet.  3. Are there any medications that need to be held prior to surgery and how long-Eliquis.  4. Practice name and name of physician performing surgery-[facility], Dr.Matthew Wakefield  5. What is your office phone and fax number- phone# [phone] fax # [phone].  6. Anesthesia type -General   Kathyrn Lass 05/07/2017, 8:45 AM  _________________________________________________________________   (provider comments below)

## 2017-05-07 NOTE — Telephone Encounter (Signed)
    Chart reviewed as part of pre-operative protocol coverage. Patient contaced 05/07/2017 in reference to pre-operative risk assessment for pending surgery as outlined below.  Cassie White was last seen on 03/19/2017 by myself, Almyra Deforest PA-C.  Since that day, Cassie White has done well from cardiac perspective.  Therefore, based on ACC/AHA guidelines, the patient would be at acceptable risk for the planned procedure without further cardiovascular testing.   Only recommendation is for her to hold her eliquis for 48 hours prior to the surgery and restart as soon as possible after the surgery once deemed low bleeding risk by the surgeon.   [place], [place] 05/07/2017, 3:20 PM

## 2017-05-07 NOTE — Telephone Encounter (Signed)
Electronically faxed to [facility] at number provided.

## 2017-05-11 DIAGNOSIS — C44319 Basal cell carcinoma of skin of other parts of face: Secondary | ICD-10-CM | POA: Diagnosis not present

## 2017-05-15 ENCOUNTER — Ambulatory Visit: Payer: Medicare Other

## 2017-05-18 ENCOUNTER — Ambulatory Visit (INDEPENDENT_AMBULATORY_CARE_PROVIDER_SITE_OTHER): Payer: Medicare Other

## 2017-05-18 DIAGNOSIS — Z23 Encounter for immunization: Secondary | ICD-10-CM

## 2017-05-19 ENCOUNTER — Ambulatory Visit (INDEPENDENT_AMBULATORY_CARE_PROVIDER_SITE_OTHER): Payer: Medicare Other | Admitting: Neurology

## 2017-05-19 ENCOUNTER — Encounter: Payer: Self-pay | Admitting: Neurology

## 2017-05-19 VITALS — BP 139/62 | HR 58 | Ht 62.0 in | Wt 152.0 lb

## 2017-05-19 DIAGNOSIS — M545 Low back pain, unspecified: Secondary | ICD-10-CM

## 2017-05-19 DIAGNOSIS — G629 Polyneuropathy, unspecified: Secondary | ICD-10-CM | POA: Diagnosis not present

## 2017-05-19 DIAGNOSIS — R0683 Snoring: Secondary | ICD-10-CM

## 2017-05-19 DIAGNOSIS — I480 Paroxysmal atrial fibrillation: Secondary | ICD-10-CM | POA: Diagnosis not present

## 2017-05-19 DIAGNOSIS — G608 Other hereditary and idiopathic neuropathies: Secondary | ICD-10-CM

## 2017-05-19 MED ORDER — CYCLOBENZAPRINE HCL 5 MG PO TABS: ORAL_TABLET | ORAL | 1 refills | Status: DC

## 2017-05-19 NOTE — Patient Instructions (Signed)
Peripheral Neuropathy Peripheral neuropathy is a type of nerve damage. It affects nerves that carry signals between the spinal cord and other parts of the body. These are called peripheral nerves. With peripheral neuropathy, one nerve or a group of nerves may be damaged. What are the causes? Many things can damage peripheral nerves. For some people with peripheral neuropathy, the cause is unknown. Some causes include:  Diabetes. This is the most common cause of peripheral neuropathy.  Injury to a nerve.  Pressure or stress on a nerve that lasts a long time.  Too little vitamin B. Alcoholism can lead to this.  Infections.  Autoimmune diseases, such as multiple sclerosis and systemic lupus erythematosus.  Inherited nerve diseases.  Some medicines, such as cancer drugs.  Toxic substances, such as lead and mercury.  Too little blood flowing to the legs.  Kidney disease.  Thyroid disease.  What are the signs or symptoms? Different people have different symptoms. The symptoms you have will depend on which of your nerves is damaged. Common symptoms include:  Loss of feeling (numbness) in the feet and hands.  Tingling in the feet and hands.  Pain that burns.  Very sensitive skin.  Weakness.  Not being able to move a part of the body (paralysis).  Muscle twitching.  Clumsiness or poor coordination.  Loss of balance.  Not being able to control your bladder.  Feeling dizzy.  Sexual problems.  How is this diagnosed? Peripheral neuropathy is a symptom, not a disease. Finding the cause of peripheral neuropathy can be hard. To figure that out, your health care provider will take a medical history and do a physical exam. A neurological exam will also be done. This involves checking things affected by your brain, spinal cord, and nerves (nervous system). For example, your health care provider will check your reflexes, how you move, and what you can feel. Other types of tests  may also be ordered, such as:  Blood tests.  A test of the fluid in your spinal cord.  Imaging tests, such as CT scans or an MRI.  Electromyography (EMG). This test checks the nerves that control muscles.  Nerve conduction velocity tests. These tests check how fast messages pass through your nerves.  Nerve biopsy. A small piece of nerve is removed. It is then checked under a microscope.  How is this treated?  Medicine is often used to treat peripheral neuropathy. Medicines may include: ? Pain-relieving medicines. Prescription or over-the-counter medicine may be suggested. ? Antiseizure medicine. This may be used for pain. ? Antidepressants. These also may help ease pain from neuropathy. ? Lidocaine. This is a numbing medicine. You might wear a patch or be given a shot. ? Mexiletine. This medicine is typically used to help control irregular heart rhythms.  Surgery. Surgery may be needed to relieve pressure on a nerve or to destroy a nerve that is causing pain.  Physical therapy to help movement.  Assistive devices to help movement. Follow these instructions at home:  Only take over-the-counter or prescription medicines as directed by your health care provider. Follow the instructions carefully for any given medicines. Do not take any other medicines without first getting approval from your health care provider.  If you have diabetes, work closely with your health care provider to keep your blood sugar under control.  If you have numbness in your feet: ? Check every day for signs of injury or infection. Watch for redness, warmth, and swelling. ? Wear padded socks and comfortable   shoes. These help protect your feet.  Do not do things that put pressure on your damaged nerve.  Do not smoke. Smoking keeps blood from getting to damaged nerves.  Avoid or limit alcohol. Too much alcohol can cause a lack of B vitamins. These vitamins are needed for healthy nerves.  Develop a good  support system. Coping with peripheral neuropathy can be stressful. Talk to a mental health specialist or join a support group if you are struggling.  Follow up with your health care provider as directed. Contact a health care provider if:  You have new signs or symptoms of peripheral neuropathy.  You are struggling emotionally from dealing with peripheral neuropathy.  You have a fever. Get help right away if:  You have an injury or infection that is not healing.  You feel very dizzy or begin vomiting.  You have chest pain.  You have trouble breathing. This information is not intended to replace advice given to you by your health care provider. Make sure you discuss any questions you have with your health care provider. Document Released: 07/11/2002 Document Revised: 12/27/2015 Document Reviewed: 03/28/2013 Elsevier Interactive Patient Education  2017 [employer]  

## 2017-05-19 NOTE — Progress Notes (Signed)
PATIENT: Cassie White DOB: 02-20-1941  REASON FOR VISIT: follow up- neuropathy HISTORY FROM: patient  HISTORY OF PRESENT ILLNESS: I have the pleasure of seeing [name] today, a 76 year old Caucasian female patient with a history of neuropathy. I have followed her off and on for 11 years! 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 She begun working with a cane - single-prong. Feet are swollen. When she gets up in the morning, her right foot has a cramping pain, localized to the foot's bottom.  She would like to inquire about augmenting therapies to control the dysesthesiae as well as the cramps. She also has atrial fib, is chronically anticoagulated and on metoprolol for rate control. She sleeps well. She snores. She is not interested in a PSG now , but may reconsider after taking to her PCP , Dr Ernestine Conrad at [place]. .    MM 11/2016 [name] is a 76 year old female with a history of neuropathy. She returns today for follow-up. She continues on gabapentin 600 mg twice a day. She feels that this is working well for her she states that she still has burning and tingling in the feet however is tolerable. She does state that she's having back pain and is scheduled for radiotherapy and ablation on the lumbar spine. She states because of this pain she does have difficulty walking. But denies any falls. She also reports to pocket of fluid on both feet. She states that this has occurred in the past and she had the fluid pulled off by a physician in [place]. However she states that the fluid keeps reoccurring. She returns today for an evaluation.  HISTORY 09/10/15: [name] is a 76 year old female with a history of neuropathy. She returns today for follow-up. She continues to take gabapentin 300 mg one tablet at lunch , 1 tablet at dinner  and 2 tablets at bedtime. She states that this is working well for her. She states occasionally she'll have some burning and tingling in the bottom of the feet but it is tolerable. She states that she is sleeping well. Denies any changes with her gait or balance. She does not use an assistive device when ambulating. She states that she did suffer a fall last week however that was because she tripped over someone's foot. Fortunately she did not suffer any significant injuries she does have a bruise on the right knee. She denies any new neurological symptoms. She returns today to have her gabapentin refilled.      REVIEW OF SYSTEMS: Out of a complete 14 system review of symptoms, the patient complains only of the following symptoms, and all other reviewed systems are negative. Foot edema, cramping, mostly right foot.  Palpitations, murmur, ringing in ears, back pain- radiating   ALLERGIES: Allergies  Allergen Reactions  . Sulfa Antibiotics Swelling    face  . Tizanidine Other (See Comments)    delirium    HOME MEDICATIONS: Outpatient Medications Prior to Visit  Medication Sig Dispense Refill  . Biotin 5000 MCG CAPS Take 5,000 mcg by mouth daily.    . Calcium Carbonate (CALCIUM 600 PO) Take 600 mg by mouth 2 (two) times daily.     . cholecalciferol (VITAMIN D) 1000 UNITS tablet Take 5,000 Units by mouth daily.     . cyanocobalamin (,VITAMIN B-12,) 1000 MCG/ML injection  Inject 1 mL (1,000 mcg total) into the muscle every 30 (thirty) days. 10 mL 1  . ELIQUIS 5 MG TABS tablet TAKE 1 TABLET (5 MG TOTAL) BY MOUTH 2 (TWO) TIMES DAILY. 180 tablet 1  . flecainide (TAMBOCOR) 50 MG tablet Take 1 tablet (50 mg total) by mouth 2 (two) times daily. 30 tablet 5  . gabapentin (NEURONTIN) 300 MG capsule TAKE 1 CAPSULE BY MOUTH AT LUNCH, 1 AT DINNER, AND 2 AT BEDTIME 360 capsule 3  . HYDROcodone-acetaminophen (NORCO) 7.5-325 MG tablet Take 1-2 tablets by mouth every 4 (four) hours as needed for moderate  pain. 100 tablet 0  . metoprolol tartrate (LOPRESSOR) 25 MG tablet Take 1 tablet (25 mg total) by mouth 2 (two) times daily. 180 tablet 3  . nitroGLYCERIN (NITROSTAT) 0.4 MG SL tablet Place 1 tablet (0.4 mg total) under the tongue every 5 (five) minutes as needed for chest pain. 25 tablet 5  . Omega-3 Fatty Acids (FISH OIL) 1200 MG CAPS Take 1,000 mg by mouth 2 (two) times daily.     . pravastatin (PRAVACHOL) 40 MG tablet TAKE 1 TABLET BY MOUTH DAILY 30 tablet 6   No facility-administered medications prior to visit.     PAST MEDICAL HISTORY: Past Medical History:  Diagnosis Date  . Abnormal mammogram of left breast    Likely benign microcalcifications--repeat L diag mammo 03/24/2017.  COMPLEX SCLEROSING LESION WITH CALCIFICATIONS --no sign of malignancy.  Excision recommended--pt has been referred to Dr. Donne Hazel.  . Arthritis    DJD, back and both hips  . Chronic low back pain without sciatica    Lumbar spondylosis + scoliosis.  Summer 2017 Dr. Nelva Bush did ESI and pt got no relief.  Pt then saw Dr. Maia Petties [facility]: felt she had facet mediated pain; plan for B L345 MBB (??).  Marland Kitchen Diverticulosis    on colonoscopy 2004  . GERD (gastroesophageal reflux disease)   . Hx of cardiovascular stress test    Lexiscan Myoview (2/14):  Low risk, small ant defect likely breast attenuation, no ischemia, EF 69% (no change from 2010).    . Hyperlipidemia   . Hypertension    per pt  . Idiopathic scoliosis of thoracolumbar region   . Lumbar back pain   . Mitral regurgitation    Echo (2/14):  EF 60-65%, Gr 1 DD, mild AI, mild bileaflet MVP, mod post directed MR, mild LAE, PASP 35.  Marland Kitchen MVP (mitral valve prolapse)   . Osteoporosis, senile    stable at the left hip 2014-2015 (T-score -2.7 when she stopped bisphosphonate).  Took fosamax for 10 yrs.  Repeat DEXA 10mo later (09/2016) T-score -.3.1 (different machine, though).  Marland Kitchen PAF (paroxysmal atrial fibrillation) (HCC)    Dr. Martinique: Flecainide,  metopr, apxiaban.  . Peripheral neuropathy 2008   Idiopathic vs familial (motor>sensory): gabapentin helpful  . Shoulder pain left   RC surg 09/2014    PAST SURGICAL HISTORY: Past Surgical History:  Procedure Laterality Date  . BREAST BIOPSY  04/10/2017   Left breast core needle biopsy of calcifications.  COMPLEX SCLEROSING LESION WITH CALCIFICATIONS --no sign of malignancy.  Excision recommended--pt has been referred to Dr. Donne Hazel.  Marland Kitchen BREAST EXCISIONAL BIOPSY Right 2010   Benign  . BREAST SURGERY  Nov 2010   Benign biopsy, right  . CHOLECYSTECTOMY  03/2010   Dr.Gross  . COLONOSCOPY  10/03/2002   No polyps.  Repeat 10 yrs recommended but pt declines.  Pt declined cologuard 08/2016 but changed  her mind and this test was NEG on 03/23/17.  . COMBINED HYSTERECTOMY VAGINAL / OOPHORECTOMY / A&P REPAIR  6387   uncertain if ovaries were removed or not  . CYSTOCELE REPAIR    . Lumpectomy  06/2009   "Fatty Necrosis"  . PFT's  2014   NORMAL  . ROTATOR CUFF REPAIR Left 10/02/14   Dr. Veverly Fells  . TONSILLECTOMY    . TOTAL KNEE ARTHROPLASTY  09/2009   Right ;Dr Alvan Dame  . TOTAL KNEE ARTHROPLASTY Left 03/10/2016   Procedure: TOTAL KNEE ARTHROPLASTY;  Surgeon: Paralee Cancel, MD;  Location: [facility];  Service: Orthopedics;  Laterality: Left;  . TRANSTHORACIC ECHOCARDIOGRAM  09/2012; 09/27/15   2014: EF 60-65%, Gr 1 DD, mild AI, mild bileaflet MVP, mod post directed MR, mild LAE, PASP 35.  Repeat 2017: EF 55-60%, mild AR, mild MVP and mild MV regurg.  Marland Kitchen VAGINAL HYSTERECTOMY     For Uterine Deviation     FAMILY HISTORY: Family History  Problem Relation Age of Onset  . Heart attack Father        in 23s  . Stroke Father 76  . Ovarian cancer Mother   . Heart attack Son 29       Smoker  . Coronary artery disease Brother   . Diabetes Neg Hx     SOCIAL HISTORY: Social History   Social History  . Marital status: Married    Spouse name: N/A  . Number of children: 3  . Years of education: HS grad     Occupational History  . [employer]- bus driver- retired    Social History Main Topics  . Smoking status: Never Smoker  . Smokeless tobacco: Never Used  . Alcohol use No  . Drug use: No  . Sexual activity: No   Other Topics Concern  . Not on file   Social History Narrative   Widow, has 3 children, 4 grandchildren.   Orig from [place].   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   Caffiene, 1 cup daily avg.   No tob, no alc, no drugs.   Exercise: walking, limited by bilat hip pain.      PHYSICAL EXAM  Vitals:   05/19/17 0916  BP: 139/62  Pulse: (!) 58  Weight: 152 lb (68.9 kg)  Height: 5\' 2"  (1.575 m)   Body mass index is 27.8 kg/m.   Retrognathia.   Generalized: Well developed, in no acute distress Lower extremities: Two fluid filled pockets on the top of the right and left foot, No cyanosis, normal pulses, warm feet.     Neurological examination  Mentation: Alert oriented to time, place, history taking. Follows all commands speech and language fluent Cranial nerve : intact taste and smell-Pupils were equal round reactive to light. Uvula and  tongue move in midline. Head turning and shoulder shrug  were  symmetric. Motor:  5 / 5 strength of all 4 extremities with symmetric motor tone is noted throughout. Grip strength is weaker.  Sensory: Sensory testing is intact to soft touch on all 4 extremities. No evidence of extinction is noted. No dermatome .  Coordination:  good finger-nose-finger movements. DTR 2/2  Loss of patella reflexes.  Gait and station: Gait is normal. Tandem gait is unsteady. Romberg is negative. No drift is seen.    DIAGNOSTIC DATA (LABS, IMAGING, TESTING) - I reviewed patient records, labs, notes, testing and imaging myself where available.  Lab Results  Component Value Date  WBC 5.6 02/16/2017   HGB 12.9 02/16/2017   HCT 38.8 02/16/2017   MCV 93.7 02/16/2017   PLT 204 02/16/2017      Component  Value Date/Time   NA 140 02/14/2017 2216   K 4.4 02/14/2017 2216   CL 106 02/14/2017 2216   CO2 27 02/14/2017 2216   GLUCOSE 111 (H) 02/14/2017 2216   BUN 21 (H) 02/14/2017 2216   CREATININE 0.78 02/14/2017 2216   CALCIUM 9.7 02/14/2017 2216   PROT 6.5 01/09/2017 0807   ALBUMIN 4.2 01/09/2017 0807   AST 15 01/09/2017 0807   ALT 14 01/09/2017 0807   ALKPHOS 56 01/09/2017 0807   BILITOT 0.9 01/09/2017 0807   GFRNONAA >60 02/14/2017 2216   GFRAA >60 02/14/2017 2216   Lab Results  Component Value Date   CHOL 185 01/09/2017   HDL 85.20 01/09/2017   LDLCALC 77 01/09/2017   LDLDIRECT 87.5 09/07/2012   TRIG 111.0 01/09/2017   CHOLHDL 2 01/09/2017   Lab Results  Component Value Date   HGBA1C  09/19/2010    5.3 (NOTE)                                                                       According to the ADA Clinical Practice Recommendations for 2011, when HbA1c is used as a screening test:   >=6.5%   Diagnostic of Diabetes Mellitus           (if abnormal result  is confirmed)  5.7-6.4%   Increased risk of developing Diabetes Mellitus  References:Diagnosis and Classification of Diabetes Mellitus,Diabetes VQMG,8676,19(JKDTO 1):S62-S69 and Standards of Medical Care in         Diabetes - 2011,Diabetes Care,2011,34  (Suppl 1):S11-S61.   Lab Results  Component Value Date   VITAMINB12 1,250 (H) 04/28/2014   Lab Results  Component Value Date   TSH 1.63 09/24/2015      ASSESSMENT AND PLAN  35 minute RV- more than 50% dediated to discussion and explanation of current neurological condition. Treatment options.  76 y.o. year old female  has a past medical history of Abnormal mammogram of left breast; Arthritis; Chronic low back pain without sciatica; Diverticulosis; GERD (gastroesophageal reflux disease); cardiovascular stress test; Hyperlipidemia; Hypertension; Idiopathic scoliosis of thoracolumbar region; Lumbar back pain; Mitral regurgitation; MVP (mitral valve prolapse); Osteoporosis,  senile; PAF (paroxysmal atrial fibrillation) ([facility]); Peripheral neuropathy (2008); and Shoulder pain (left). here with:  1. Neuropathy ( NP ) , pain full- partially related to DDD lumbar area? Non diabetic neuropathy .  2. Atrial fib on chronic anticoagulation.  She will continue on gabapentin 600 mg twice a day. It appears that she may have possible synovial cysts on the top of the right and left foot. Draining the cysts has not helped the NP. She reports cramping in her foot, has not tried low dose muscle relaxants, which I will try now [facility]) it may help her localized back pain, too.  Plan b would be a trial of a topical analgesic cream- compounded.  I like for her to think about a sleep study- she has paroxysmal atrial fibrillation, high risk for OSA.  She will see Np [place]ikan in 4-6 month and let her know if she is ready.  Larey Seat, MD     Ward Givens, MSN, NP-C 05/19/2017, 9:51 AM [facility] [address], [place], [place] [address] [phone]

## 2017-06-01 DIAGNOSIS — M47816 Spondylosis without myelopathy or radiculopathy, lumbar region: Secondary | ICD-10-CM | POA: Diagnosis not present

## 2017-06-01 DIAGNOSIS — M545 Low back pain: Secondary | ICD-10-CM | POA: Diagnosis not present

## 2017-06-01 DIAGNOSIS — M4120 Other idiopathic scoliosis, site unspecified: Secondary | ICD-10-CM | POA: Diagnosis not present

## 2017-06-15 ENCOUNTER — Encounter: Payer: Self-pay | Admitting: Family Medicine

## 2017-06-19 ENCOUNTER — Ambulatory Visit: Payer: Medicare Other | Admitting: Cardiology

## 2017-07-03 DIAGNOSIS — H543 Unqualified visual loss, both eyes: Secondary | ICD-10-CM | POA: Diagnosis not present

## 2017-07-03 DIAGNOSIS — H25813 Combined forms of age-related cataract, bilateral: Secondary | ICD-10-CM | POA: Diagnosis not present

## 2017-07-14 ENCOUNTER — Ambulatory Visit: Payer: Medicare Other | Admitting: Cardiology

## 2017-07-16 ENCOUNTER — Telehealth: Payer: Self-pay | Admitting: Cardiology

## 2017-07-16 DIAGNOSIS — I480 Paroxysmal atrial fibrillation: Secondary | ICD-10-CM

## 2017-07-16 MED ORDER — FLECAINIDE ACETATE 50 MG PO TABS: 50.0000 mg | ORAL_TABLET | Freq: Two times a day (BID) | ORAL | 5 refills | Status: DC

## 2017-07-16 NOTE — Telephone Encounter (Signed)
New Medication      *STAT* If patient is at the pharmacy, call can be transferred to refill team.   1. Which medications need to be refilled? (please list name of each medication and dose if known  flecainide (TAMBOCOR) 50 MG tablet Take 1 tablet (50 mg total) by mouth 2 (two) times daily.  2. Which pharmacy/location (including street and city if local pharmacy) is medication to be sent to? [facility]  3. Do they need a 30 day or 90 day supply? 30 day but enough to last until her appt in Feb

## 2017-07-16 NOTE — Telephone Encounter (Signed)
Rx has been sent to the pharmacy electronically. ° °

## 2017-07-17 DIAGNOSIS — C44319 Basal cell carcinoma of skin of other parts of face: Secondary | ICD-10-CM | POA: Diagnosis not present

## 2017-07-17 DIAGNOSIS — D225 Melanocytic nevi of trunk: Secondary | ICD-10-CM | POA: Diagnosis not present

## 2017-07-17 DIAGNOSIS — D485 Neoplasm of uncertain behavior of skin: Secondary | ICD-10-CM | POA: Diagnosis not present

## 2017-07-17 DIAGNOSIS — L578 Other skin changes due to chronic exposure to nonionizing radiation: Secondary | ICD-10-CM | POA: Diagnosis not present

## 2017-07-17 DIAGNOSIS — Z85828 Personal history of other malignant neoplasm of skin: Secondary | ICD-10-CM | POA: Diagnosis not present

## 2017-08-04 DIAGNOSIS — G629 Polyneuropathy, unspecified: Secondary | ICD-10-CM

## 2017-08-04 HISTORY — DX: Polyneuropathy, unspecified: G62.9

## 2017-08-06 ENCOUNTER — Other Ambulatory Visit: Payer: Self-pay | Admitting: Cardiology

## 2017-08-14 ENCOUNTER — Ambulatory Visit: Payer: Medicare Other | Admitting: Family Medicine

## 2017-08-14 ENCOUNTER — Ambulatory Visit: Payer: Medicare Other

## 2017-08-24 ENCOUNTER — Other Ambulatory Visit: Payer: Self-pay | Admitting: Family Medicine

## 2017-08-24 DIAGNOSIS — I4891 Unspecified atrial fibrillation: Secondary | ICD-10-CM

## 2017-08-24 NOTE — Telephone Encounter (Signed)
Pt is over due for f/u [facility] with Dr. Anitra Lauth, will send Rx for #180 w/ 0RF. Needs office visit for more refills.

## 2017-08-25 NOTE — Telephone Encounter (Signed)
Left message for pt to call back.   Okay for [facility] to advise pt and schedule apt. 

## 2017-08-26 ENCOUNTER — Telehealth: Payer: Self-pay

## 2017-08-26 NOTE — Telephone Encounter (Signed)
Pt declines AWV.

## 2017-08-26 NOTE — Telephone Encounter (Signed)
Pt scheduled an apt on 09/04/17 at 11:00pm.

## 2017-09-03 ENCOUNTER — Encounter: Payer: Self-pay | Admitting: *Deleted

## 2017-09-03 DIAGNOSIS — I459 Conduction disorder, unspecified: Secondary | ICD-10-CM | POA: Insufficient documentation

## 2017-09-04 ENCOUNTER — Encounter: Payer: Self-pay | Admitting: Family Medicine

## 2017-09-04 ENCOUNTER — Ambulatory Visit (INDEPENDENT_AMBULATORY_CARE_PROVIDER_SITE_OTHER): Payer: Medicare Other | Admitting: Family Medicine

## 2017-09-04 VITALS — BP 122/64 | HR 48 | Temp 97.9°F | Resp 16 | Ht 62.0 in | Wt 151.0 lb

## 2017-09-04 DIAGNOSIS — G250 Essential tremor: Secondary | ICD-10-CM | POA: Diagnosis not present

## 2017-09-04 DIAGNOSIS — I480 Paroxysmal atrial fibrillation: Secondary | ICD-10-CM

## 2017-09-04 DIAGNOSIS — E7800 Pure hypercholesterolemia, unspecified: Secondary | ICD-10-CM | POA: Diagnosis not present

## 2017-09-04 DIAGNOSIS — Z7901 Long term (current) use of anticoagulants: Secondary | ICD-10-CM | POA: Diagnosis not present

## 2017-09-04 DIAGNOSIS — I10 Essential (primary) hypertension: Secondary | ICD-10-CM

## 2017-09-04 LAB — CBC WITH DIFFERENTIAL/PLATELET
BASOS PCT: 0.8 % (ref 0.0–3.0)
Basophils Absolute: 0 10*3/uL (ref 0.0–0.1)
EOS ABS: 0.1 10*3/uL (ref 0.0–0.7)
Eosinophils Relative: 1.1 % (ref 0.0–5.0)
HCT: 41.4 % (ref 36.0–46.0)
HEMOGLOBIN: 13.9 g/dL (ref 12.0–15.0)
Lymphocytes Relative: 31.8 % (ref 12.0–46.0)
Lymphs Abs: 1.6 10*3/uL (ref 0.7–4.0)
MCHC: 33.7 g/dL (ref 30.0–36.0)
MCV: 90.5 fl (ref 78.0–100.0)
MONO ABS: 0.5 10*3/uL (ref 0.1–1.0)
Monocytes Relative: 10.7 % (ref 3.0–12.0)
NEUTROS PCT: 55.6 % (ref 43.0–77.0)
Neutro Abs: 2.8 10*3/uL (ref 1.4–7.7)
Platelets: 174 10*3/uL (ref 150.0–400.0)
RBC: 4.57 Mil/uL (ref 3.87–5.11)
RDW: 13.4 % (ref 11.5–15.5)
WBC: 5.1 10*3/uL (ref 4.0–10.5)

## 2017-09-04 LAB — LIPID PANEL
CHOLESTEROL: 175 mg/dL (ref 0–200)
HDL: 65.4 mg/dL (ref >39.00)
LDL CALC: 93 mg/dL (ref 0–99)
NonHDL: 109.32
Total CHOL/HDL Ratio: 3
Triglycerides: 80 mg/dL (ref 0.0–149.0)
VLDL: 16 mg/dL (ref 0.0–40.0)

## 2017-09-04 LAB — COMPREHENSIVE METABOLIC PANEL
ALBUMIN: 4.2 g/dL (ref 3.5–5.2)
ALK PHOS: 64 U/L (ref 39–117)
ALT: 14 U/L (ref 0–35)
AST: 16 U/L (ref 0–37)
BILIRUBIN TOTAL: 0.8 mg/dL (ref 0.2–1.2)
BUN: 16 mg/dL (ref 6–23)
CALCIUM: 9.9 mg/dL (ref 8.4–10.5)
CHLORIDE: 105 meq/L (ref 96–112)
CO2: 28 mEq/L (ref 19–32)
CREATININE: 0.73 mg/dL (ref 0.40–1.20)
GFR: 82.29 mL/min (ref >60.00)
Glucose, Bld: 89 mg/dL (ref 70–99)
Potassium: 4.8 mEq/L (ref 3.5–5.1)
SODIUM: 139 meq/L (ref 135–145)
TOTAL PROTEIN: 6.6 g/dL (ref 6.0–8.3)

## 2017-09-04 MED ORDER — ZOSTER VAC RECOMB ADJUVANTED 50 MCG/0.5ML IM SUSR: 0.5000 mL | Freq: Once | INTRAMUSCULAR | 1 refills | Status: AC

## 2017-09-04 NOTE — Progress Notes (Signed)
OFFICE VISIT  09/09/2017   CC:  Chief Complaint  Patient presents with  . Follow-up    [facility], pt is fasting.    HPI:    Patient is a 77 y.o. Caucasian female who presents for f/u PAF, HTN, HLD.  PAF: feels some palpitations.  Occ, brief.  No dizzines, CP, or SOB.  Has appt with cardiologist later this month. No melena, hematochezia, excessive bleeding, or nose bleeds.  HLD: taking pravastatin EVERY DAY.  No side effects.  HTN: no home bp monitoring.    Her tremulousness of both UEs is gradually worsening.  She has cut back on caffeine--1 coke in morning.  Occ ice tea at lunch.  No ataxia.     Past Medical History:  Diagnosis Date  . Abnormal mammogram of left breast    Likely benign microcalcifications--repeat L diag mammo 03/24/2017.  COMPLEX SCLEROSING LESION WITH CALCIFICATIONS --no sign of malignancy.  Excision recommended--pt has been referred to Dr. Donne Hazel.  . Arthritis    DJD, back and both hips  . Chronic low back pain without sciatica    Lumbar spondylosis + scoliosis.  Summer 2017 Dr. Nelva Bush did ESI and pt got no relief.  Pt then saw Dr. Maia Petties [facility]: felt she had facet mediated pain; plan for B L345 MBB (??).  Marland Kitchen Diverticulosis    on colonoscopy 2004  . GERD (gastroesophageal reflux disease)   . Hx of cardiovascular stress test    Lexiscan Myoview (2/14):  Low risk, small ant defect likely breast attenuation, no ischemia, EF 69% (no change from 2010).    . Hyperlipidemia   . Hypertension    per pt  . Idiopathic scoliosis of thoracolumbar region   . Lumbar back pain   . Mitral regurgitation    Echo (2/14):  EF 60-65%, Gr 1 DD, mild AI, mild bileaflet MVP, mod post directed MR, mild LAE, PASP 35.  Marland Kitchen MVP (mitral valve prolapse)   . Osteoporosis, senile    stable at the left hip 2014-2015 (T-score -2.7 when she stopped bisphosphonate).  Took fosamax for 10 yrs.  Repeat DEXA 46mo later (09/2016) T-score -.3.1 (different machine, though).  Marland Kitchen PAF  (paroxysmal atrial fibrillation) (HCC)    Dr. Martinique: Flecainide, metopr, apxiaban.  . Peripheral neuropathy 2008   Idiopathic vs familial (motor>sensory) vs lumbar disc dz: gabapentin helpful  . Shoulder pain left   RC surg 09/2014    Past Surgical History:  Procedure Laterality Date  . BREAST BIOPSY  04/10/2017   Left breast core needle biopsy of calcifications.  COMPLEX SCLEROSING LESION WITH CALCIFICATIONS --no sign of malignancy.  Excision recommended--pt has been referred to Dr. Donne Hazel.  Marland Kitchen BREAST EXCISIONAL BIOPSY Right 2010   Benign  . BREAST SURGERY  Nov 2010   Benign biopsy, right  . CHOLECYSTECTOMY  03/2010   Dr.Gross  . COLONOSCOPY  10/03/2002   No polyps.  Repeat 10 yrs recommended but pt declines.  Pt declined cologuard 08/2016 but changed her mind and this test was NEG on 03/23/17.  . COMBINED HYSTERECTOMY VAGINAL / OOPHORECTOMY / A&P REPAIR  1610   uncertain if ovaries were removed or not  . CYSTOCELE REPAIR    . Lumpectomy  06/2009   "Fatty Necrosis"  . PFT's  2014   NORMAL  . ROTATOR CUFF REPAIR Left 10/02/14   Dr. Veverly Fells  . TONSILLECTOMY    . TOTAL KNEE ARTHROPLASTY  09/2009   Right ;Dr Alvan Dame  . TOTAL KNEE ARTHROPLASTY Left 03/10/2016  Procedure: TOTAL KNEE ARTHROPLASTY;  Surgeon: Paralee Cancel, MD;  Location: [facility];  Service: Orthopedics;  Laterality: Left;  . TRANSTHORACIC ECHOCARDIOGRAM  09/2012; 09/27/15   2014: EF 60-65%, Gr 1 DD, mild AI, mild bileaflet MVP, mod post directed MR, mild LAE, PASP 35.  Repeat 2017: EF 55-60%, mild AR, mild MVP and mild MV regurg.  Marland Kitchen VAGINAL HYSTERECTOMY     For Uterine Deviation     Outpatient Medications Prior to Visit  Medication Sig Dispense Refill  . Biotin 5000 MCG CAPS Take 5,000 mcg by mouth daily.    . Calcium Carbonate (CALCIUM 600 PO) Take 600 mg by mouth 2 (two) times daily.     . cholecalciferol (VITAMIN D) 1000 UNITS tablet Take 5,000 Units by mouth daily.     . cyanocobalamin (,VITAMIN B-12,) 1000 MCG/ML  injection Inject 1 mL (1,000 mcg total) into the muscle every 30 (thirty) days. 10 mL 1  . cyclobenzaprine (FLEXERIL) 5 MG tablet Use one at night to allow relaxation of muscle spasms. 30 tablet 1  . ELIQUIS 5 MG TABS tablet TAKE 1 TABLET BY MOUTH TWICE A DAY 180 tablet 1  . flecainide (TAMBOCOR) 50 MG tablet Take 1 tablet (50 mg total) by mouth 2 (two) times daily. 60 tablet 5  . gabapentin (NEURONTIN) 300 MG capsule TAKE 1 CAPSULE BY MOUTH AT LUNCH, 1 AT DINNER, AND 2 AT BEDTIME 360 capsule 3  . HYDROcodone-acetaminophen (NORCO) 7.5-325 MG tablet Take 1-2 tablets by mouth every 4 (four) hours as needed for moderate pain. 100 tablet 0  . metoprolol tartrate (LOPRESSOR) 25 MG tablet TAKE 1 TABLET (25 MG TOTAL) BY MOUTH 2 (TWO) TIMES DAILY. 180 tablet 0  . nitroGLYCERIN (NITROSTAT) 0.4 MG SL tablet Place 1 tablet (0.4 mg total) under the tongue every 5 (five) minutes as needed for chest pain. 25 tablet 5  . Omega-3 Fatty Acids (FISH OIL) 1200 MG CAPS Take 1,000 mg by mouth 2 (two) times daily.     . pravastatin (PRAVACHOL) 40 MG tablet TAKE 1 TABLET BY MOUTH DAILY 30 tablet 6   No facility-administered medications prior to visit.     Allergies  Allergen Reactions  . Sulfa Antibiotics Swelling    face  . Tizanidine Other (See Comments)    delirium    ROS Review of Systems  Constitutional: Negative for appetite change, chills, fatigue and fever.  HENT: Negative for congestion, dental problem, ear pain and sore throat.   Eyes: Negative for discharge, redness and visual disturbance.  Respiratory: Negative for cough, chest tightness, shortness of breath and wheezing.   Cardiovascular: Negative for chest pain, palpitations and leg swelling.  Gastrointestinal: Negative for abdominal pain, blood in stool, diarrhea, nausea and vomiting.  Genitourinary: Negative for difficulty urinating, dysuria, flank pain, frequency, hematuria and urgency.  Musculoskeletal: Negative for arthralgias, back pain,  joint swelling, myalgias and neck stiffness.  Skin: Negative for pallor and rash.  Neurological: Positive for tremors (see hpi). Negative for dizziness, speech difficulty, weakness and headaches.  Hematological: Negative for adenopathy. Does not bruise/bleed easily.  Psychiatric/Behavioral: Negative for confusion and sleep disturbance. The patient is not nervous/anxious.     PE: Blood pressure 122/64, pulse (!) 48, temperature 97.9 F (36.6 C), temperature source Oral, resp. rate 16, height 5\' 2"  (1.575 m), weight 151 lb (68.5 kg), SpO2 96 %. Gen: Alert, well appearing.  Patient is oriented to person, place, time, and situation. AFFECT: pleasant, lucid thought and speech. ENT: Ears: EACs clear, normal epithelium.  TMs with good light reflex and landmarks bilaterally.  Eyes: no injection, icteris, swelling, or exudate.  EOMI, PERRLA. Nose: no drainage or turbinate edema/swelling.  No injection or focal lesion.  Mouth: lips without lesion/swelling.  Oral mucosa pink and moist.  Dentition intact and without obvious caries or gingival swelling.  Oropharynx without erythema, exudate, or swelling.  Neck: supple/nontender.  No LAD, mass, or TM.  Carotid pulses 2+ bilaterally, without bruits. CV: RRR, no m/r/g.   LUNGS: CTA bilat, nonlabored resps, good aeration in all lung fields. ABD: soft, NT, ND, BS normal.  No hepatospenomegaly or mass.  No bruits. EXT: no clubbing, cyanosis, or edema.  Musculoskeletal: no joint swelling, erythema, warmth, or tenderness.  ROM of all joints intact. Skin - no sores or suspicious lesions or rashes or color changes Neuro: CN 2-12 intact bilaterally, strength 5/5 in proximal and distal upper extremities and lower extremities bilaterally. No tremor.  No disdiadochokinesis.  No ataxia.  Upper extremity and lower extremity DTRs symmetric.  No pronator drift.   LABS:  Lab Results  Component Value Date   TSH 1.63 09/24/2015   Lab Results  Component Value Date   WBC  5.1 09/04/2017   HGB 13.9 09/04/2017   HCT 41.4 09/04/2017   MCV 90.5 09/04/2017   PLT 174.0 09/04/2017   Lab Results  Component Value Date   CREATININE 0.73 09/04/2017   BUN 16 09/04/2017   NA 139 09/04/2017   K 4.8 09/04/2017   CL 105 09/04/2017   CO2 28 09/04/2017   Lab Results  Component Value Date   ALT 14 09/04/2017   AST 16 09/04/2017   ALKPHOS 64 09/04/2017   BILITOT 0.8 09/04/2017   Lab Results  Component Value Date   CHOL 175 09/04/2017   Lab Results  Component Value Date   HDL 65.40 09/04/2017   Lab Results  Component Value Date   LDLCALC 93 09/04/2017   Lab Results  Component Value Date   TRIG 80.0 09/04/2017   Lab Results  Component Value Date   CHOLHDL 3 09/04/2017   Lab Results  Component Value Date   HGBA1C  09/19/2010    5.3 (NOTE)                                                                       According to the ADA Clinical Practice Recommendations for 2011, when HbA1c is used as a screening test:   >=6.5%   Diagnostic of Diabetes Mellitus           (if abnormal result  is confirmed)  5.7-6.4%   Increased risk of developing Diabetes Mellitus  References:Diagnosis and Classification of Diabetes Mellitus,Diabetes YNWG,9562,13(YQMVH 1):S62-S69 and Standards of Medical Care in         Diabetes - 2011,Diabetes Care,2011,34  (Suppl 1):S11-S61.     IMPRESSION AND PLAN:  1) Essential tremor: discussed treatment---she cannot take propranolol b/c she is on lopressor already. Primodone discussed but after weighing potential risks vs potential benefits, she decided against a trial of this med.  Watchful waiting approach at this time.  2) PAF, HTN, HLD---all stable. Labs: CBC, CMET, FLP. Cervical ca screening: n/a due to hysterectomy for benign reason. Breast ca screening: abnl mammo 03/2017 (L  breast): bx showed benign features but excision was recommended---was referred to Gen surgeon (Dr. Donne Hazel).  Surgeon recommended repeat mammogram 106mo and  only remove if signs of potential malignancy.   Colon ca screening UTD with cologuard neg 2018. Vaccines UTD other than shingrix--printed rx for this for her today.  An After Visit Summary was printed and given to the patient.  FOLLOW UP: Return in about 6 months (around 03/04/2018) for routine chronic illness f/u.  Signed:  Crissie Sickles, MD           09/09/2017

## 2017-09-14 ENCOUNTER — Other Ambulatory Visit: Payer: Self-pay

## 2017-09-14 DIAGNOSIS — I480 Paroxysmal atrial fibrillation: Secondary | ICD-10-CM

## 2017-09-14 MED ORDER — FLECAINIDE ACETATE 50 MG PO TABS: 50.0000 mg | ORAL_TABLET | Freq: Two times a day (BID) | ORAL | 2 refills | Status: DC

## 2017-09-15 IMAGING — MG 2D DIGITAL DIAGNOSTIC UNILATERAL LEFT MAMMOGRAM WITH CAD AND ADJ
8 of 13 series · 8 of 25 positions shown · non-contrast
Comparison: Previous exam(s).

CLINICAL DATA: Screening recall for a possible distortion,
asymmetry and calcifications in the left breast.

EXAM:
2D DIGITAL DIAGNOSTIC UNILATERAL LEFT MAMMOGRAM WITH CAD AND ADJUNCT
TOMO

[L ML (1 of 3)]
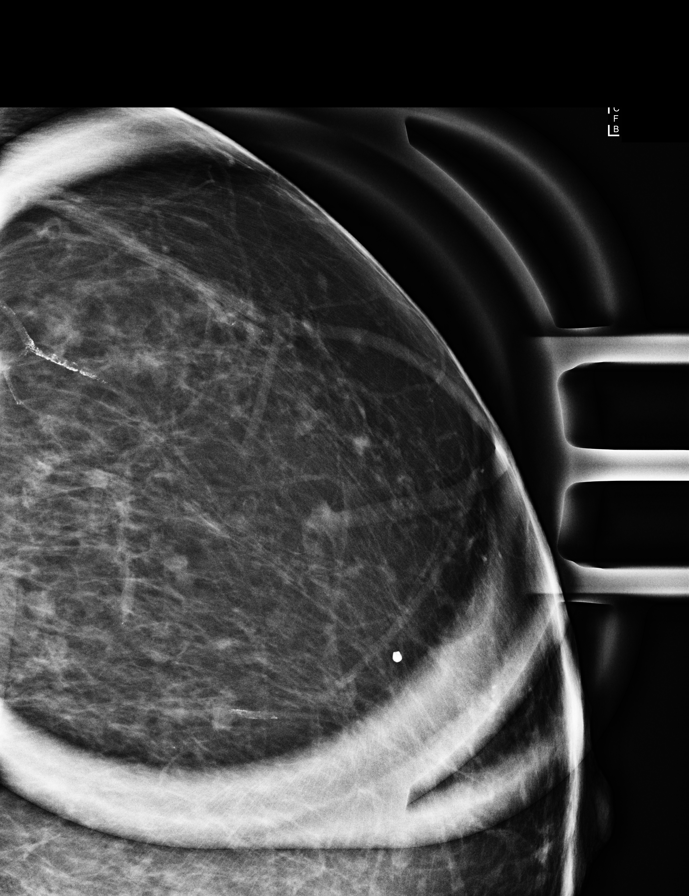

[L CC (1 of 3)]
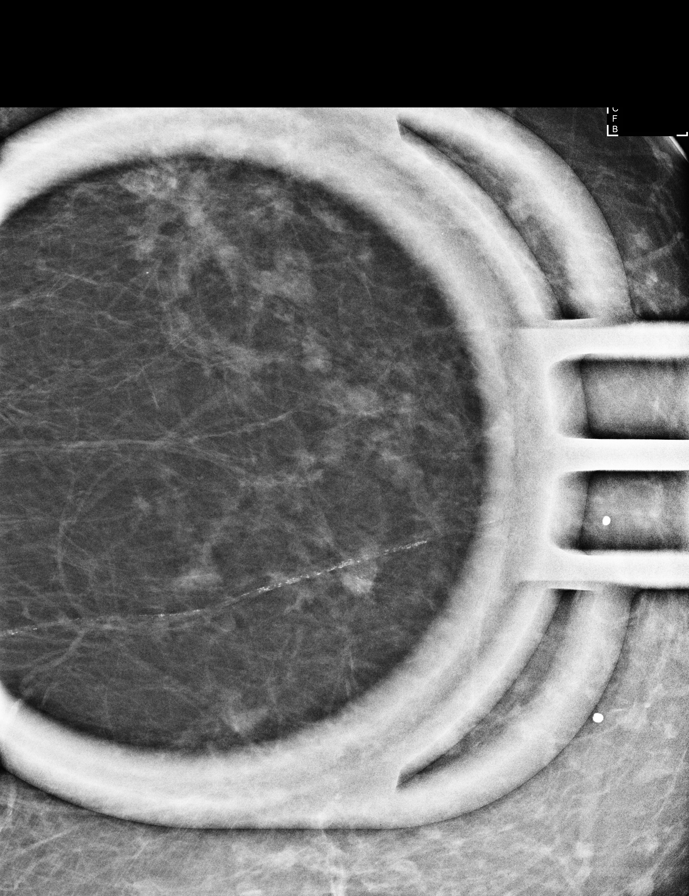

[L CC (2 of 3)]
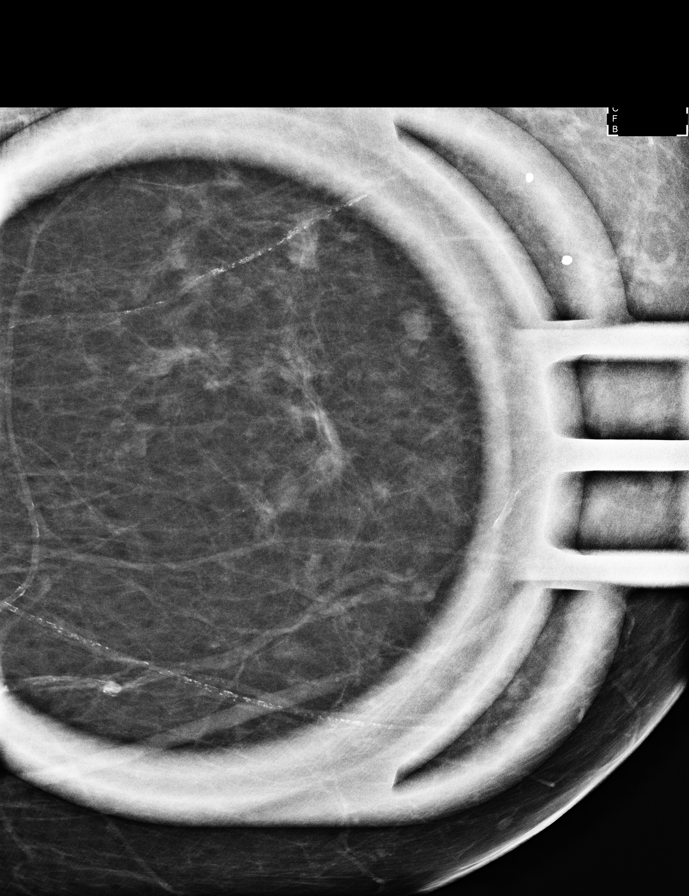

[L ML (2 of 3)]
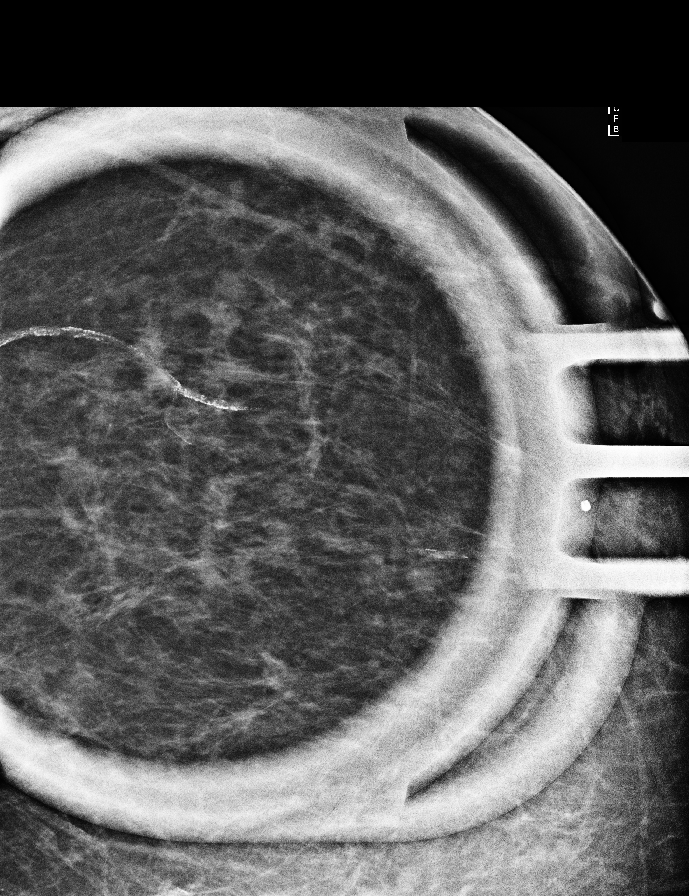

[L ML (3 of 3)]
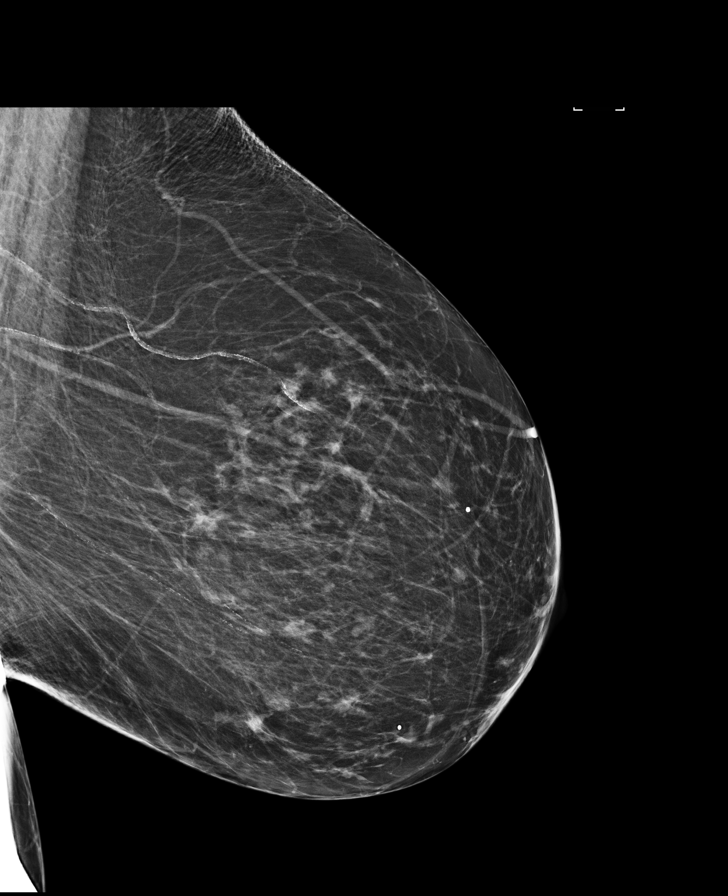

[L CC (3 of 3)]
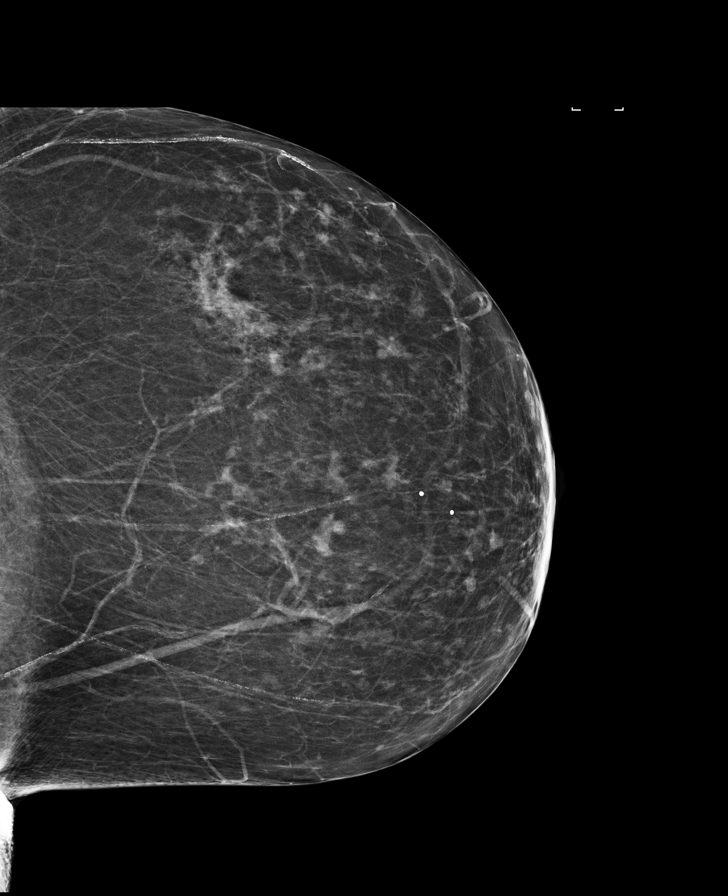

[L CC synth-2D]
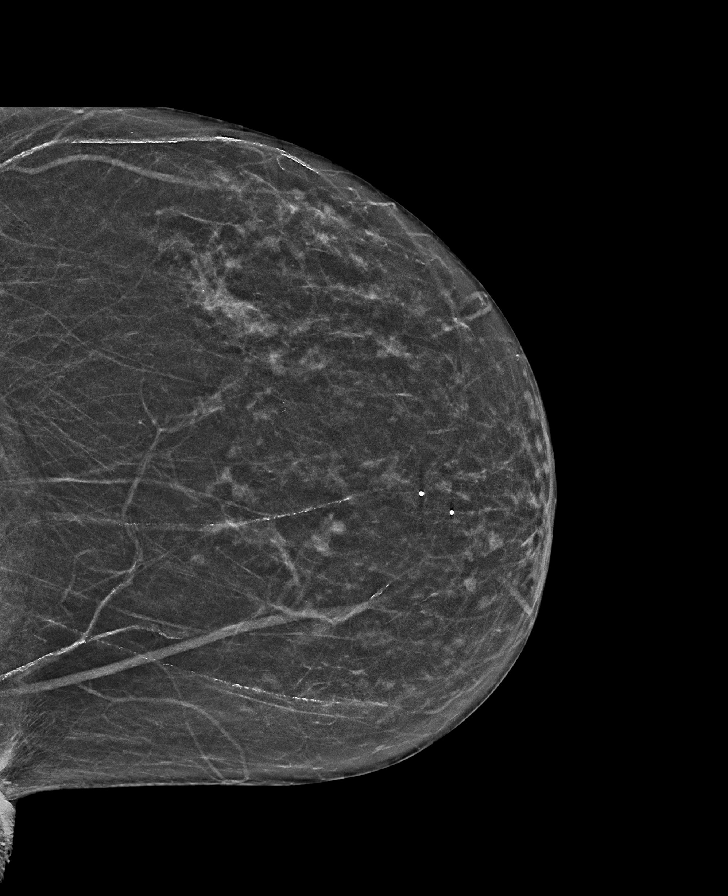

[L ML synth-2D]
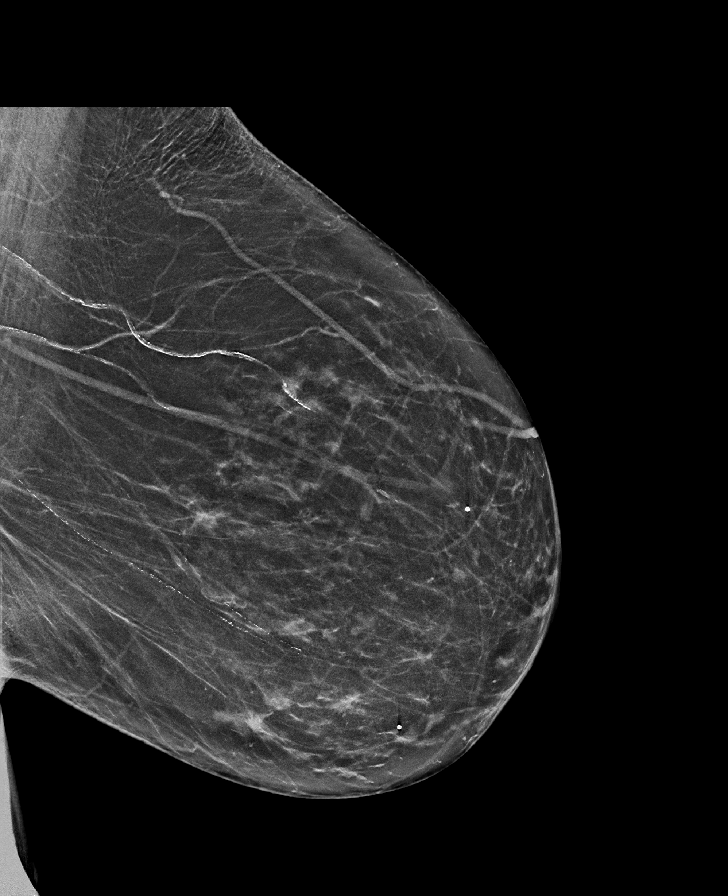

[8 of 25 positions shown; findings below may reference images not displayed]

ACR Breast Density Category b: There are scattered areas of
fibroglandular density.
FINDINGS: There is no persistent distortion or concerning asymmetry in the
left breast. The punctate 9 mm group of calcifications identified in
the upper slightly outer aspect of the left breast in the middle
depth knee be vascular. There is no suspicious morphology or
distribution of calcifications identified.

Mammographic images were processed with CAD.
IMPRESSION: 1. Resolution of the asymmetry and questioned distortion in the left
breast consistent with overlapping fibroglandular tissue.

2. There is a 9 mm probably benign group of calcifications in the
left breast, possibly vascular in nature.

RECOMMENDATION:
Six-month follow-up diagnostic left breast mammogram is recommended.

I have discussed the findings and recommendations with the patient.
Results were also provided in writing at the conclusion of the
visit. If applicable, a reminder letter will be sent to the patient
regarding the next appointment.

BI-RADS CATEGORY  3: Probably benign.

## 2017-09-16 NOTE — Progress Notes (Signed)
Cardiology Office Note    Date:  09/18/2017   ID:  JALEIYAH ALAS, DOB [birth year], MRN [id number]  PCP:  Tammi Sou, MD  Cardiologist:  Dr. Martinique  Chief Complaint  Patient presents with  . Palpitations    History of Present Illness:  Cassie White is a 77 y.o. female with PMH of MVP, MR, HLD, GERD and PAF. She had a Myoview in 2010 with anterior breast attenuation, otherwise normal. Myoview obtained in February 2014 was low risk, small anterior defect likely breast attenuation, however no ischemia, EF 69%. Echocardiogram obtained in February 2014 showed EF 29-93%, grade 1 diastolic dysfunction, moderate posterior directed MR, PASP 35 mmHg. Last echocardiogram obtained on 09/27/2015 showed EF 55-60%, mild AR, mild MR, PA peak pressure 36 mmHg. Patient was noted to be in atrial fibrillation during the study. She was placed on eliquis and flecainide and subsequently went back to sinus rhythm. She underwent Myoview on 10/18/2015 to rule out pro arrhythmia and the ischemic heart disease, there was moderate sized and intensity partially reversible anterior and apical defect, EF was 64%, this is likely representing shifting breast artifact, overall it was considered intermediate risk study. The study was reviewed by Dr. Martinique, no further testing was felt to be needed, the image was consider low risk.  She was seen on 11/21/2016 for preoperative clearance, she was doing well at the time, she was cleared to proceed with back surgery.   She did go to the hospital on 02/14/2017 with a large bruise on the medial aspect of the left thigh. This occurred in the absence of trauma. She was on eliquis, it was suspected that she may have had a ruptured varicose vein. This was managed conservatively.  On follow up today she is doing very well from a cardiac standpoint. She really has very infrequent palpitations. No dyspnea or chest pain. She never did have spinal surgery- was told nothing could be done. She  did receive injection without improvement. She wants to consider a second opinion.   Past Medical History:  Diagnosis Date  . Abnormal mammogram of left breast    Likely benign microcalcifications--repeat L diag mammo 03/24/2017.  COMPLEX SCLEROSING LESION WITH CALCIFICATIONS --no sign of malignancy.  Excision recommended--pt has been referred to Dr. Donne Hazel.  . Arthritis    DJD, back and both hips  . Chronic low back pain without sciatica    Lumbar spondylosis + scoliosis.  Summer 2017 Dr. Nelva Bush did ESI and pt got no relief.  Pt then saw Dr. Maia Petties [facility]: felt she had facet mediated pain; plan for B L345 MBB (??).  Marland Kitchen Diverticulosis    on colonoscopy 2004  . GERD (gastroesophageal reflux disease)   . Hx of cardiovascular stress test    Lexiscan Myoview (2/14):  Low risk, small ant defect likely breast attenuation, no ischemia, EF 69% (no change from 2010).    . Hyperlipidemia   . Hypertension    per pt  . Idiopathic scoliosis of thoracolumbar region   . Lumbar back pain   . Mitral regurgitation    Echo (2/14):  EF 60-65%, Gr 1 DD, mild AI, mild bileaflet MVP, mod post directed MR, mild LAE, PASP 35.  Marland Kitchen MVP (mitral valve prolapse)   . Osteoporosis, senile    stable at the left hip 2014-2015 (T-score -2.7 when she stopped bisphosphonate).  Took fosamax for 10 yrs.  Repeat DEXA 30mo later (09/2016) T-score -.3.1 (different machine, though).  Marland Kitchen PAF (  paroxysmal atrial fibrillation) (HCC)    Dr. Martinique: Flecainide, metopr, apxiaban.  . Peripheral neuropathy 2008   Idiopathic vs familial (motor>sensory) vs lumbar disc dz: gabapentin helpful  . Shoulder pain left   RC surg 09/2014    Past Surgical History:  Procedure Laterality Date  . BREAST BIOPSY  04/10/2017   Left breast core needle biopsy of calcifications.  COMPLEX SCLEROSING LESION WITH CALCIFICATIONS --no sign of malignancy.  Excision recommended--pt has been referred to Dr. Donne Hazel.  Marland Kitchen BREAST EXCISIONAL  BIOPSY Right 2010   Benign  . BREAST SURGERY  Nov 2010   Benign biopsy, right  . CHOLECYSTECTOMY  03/2010   Dr.Gross  . COLONOSCOPY  10/03/2002   No polyps.  Repeat 10 yrs recommended but pt declines.  Pt declined cologuard 08/2016 but changed her mind and this test was NEG on 03/23/17.  . COMBINED HYSTERECTOMY VAGINAL / OOPHORECTOMY / A&P REPAIR  5732   uncertain if ovaries were removed or not  . CYSTOCELE REPAIR    . Lumpectomy  06/2009   "Fatty Necrosis"  . PFT's  2014   NORMAL  . ROTATOR CUFF REPAIR Left 10/02/14   Dr. Veverly Fells  . TONSILLECTOMY    . TOTAL KNEE ARTHROPLASTY  09/2009   Right ;Dr Alvan Dame  . TOTAL KNEE ARTHROPLASTY Left 03/10/2016   Procedure: TOTAL KNEE ARTHROPLASTY;  Surgeon: Paralee Cancel, MD;  Location: [facility];  Service: Orthopedics;  Laterality: Left;  . TRANSTHORACIC ECHOCARDIOGRAM  09/2012; 09/27/15   2014: EF 60-65%, Gr 1 DD, mild AI, mild bileaflet MVP, mod post directed MR, mild LAE, PASP 35.  Repeat 2017: EF 55-60%, mild AR, mild MVP and mild MV regurg.  Marland Kitchen VAGINAL HYSTERECTOMY     For Uterine Deviation     Current Medications: Outpatient Medications Prior to Visit  Medication Sig Dispense Refill  . Biotin 5000 MCG CAPS Take 5,000 mcg by mouth daily.    . Calcium Carbonate (CALCIUM 600 PO) Take 600 mg by mouth 2 (two) times daily.     . cholecalciferol (VITAMIN D) 1000 UNITS tablet Take 5,000 Units by mouth daily.     . cyanocobalamin (,VITAMIN B-12,) 1000 MCG/ML injection Inject 1 mL (1,000 mcg total) into the muscle every 30 (thirty) days. 10 mL 1  . cyclobenzaprine (FLEXERIL) 5 MG tablet Use one at night to allow relaxation of muscle spasms. 30 tablet 1  . ELIQUIS 5 MG TABS tablet TAKE 1 TABLET BY MOUTH TWICE A DAY 180 tablet 1  . flecainide (TAMBOCOR) 50 MG tablet Take 1 tablet (50 mg total) by mouth 2 (two) times daily. 180 tablet 2  . gabapentin (NEURONTIN) 300 MG capsule TAKE 1 CAPSULE BY MOUTH AT LUNCH, 1 AT DINNER, AND 2 AT BEDTIME 360 capsule 3  .  HYDROcodone-acetaminophen (NORCO) 7.5-325 MG tablet Take 1-2 tablets by mouth every 4 (four) hours as needed for moderate pain. 100 tablet 0  . metoprolol tartrate (LOPRESSOR) 25 MG tablet TAKE 1 TABLET (25 MG TOTAL) BY MOUTH 2 (TWO) TIMES DAILY. 180 tablet 0  . nitroGLYCERIN (NITROSTAT) 0.4 MG SL tablet Place 1 tablet (0.4 mg total) under the tongue every 5 (five) minutes as needed for chest pain. 25 tablet 5  . Omega-3 Fatty Acids (FISH OIL) 1200 MG CAPS Take 1,000 mg by mouth 2 (two) times daily.     . pravastatin (PRAVACHOL) 40 MG tablet TAKE 1 TABLET BY MOUTH DAILY 30 tablet 6   No facility-administered medications prior to visit.  Allergies:   Sulfa antibiotics and Tizanidine   Social History   Socioeconomic History  . Marital status: Married    Spouse name: None  . Number of children: 3  . Years of education: HS grad  . Highest education level: None  Social Needs  . Financial resource strain: None  . Food insecurity - worry: None  . Food insecurity - inability: None  . Transportation needs - medical: None  . Transportation needs - non-medical: None  Occupational History  . 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 Smoking status: Never Smoker  . Smokeless tobacco: Never Used  Substance and Sexual Activity  . Alcohol use: No    Alcohol/week: 0.0 oz  . Drug use: No  . Sexual activity: No    Birth control/protection: Post-menopausal  Other Topics Concern  . None  Social History Narrative   Widow, has 3 children, 4 grandchildren.   Orig from [place].   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   Caffiene, 1 cup daily avg.   No tob, no alc, no drugs.   Exercise: walking, limited by bilat hip pain.     Family History:  The patient's family history includes Coronary artery disease in her brother; Heart attack in her father; Heart attack (age of onset: 68) in her son; Ovarian cancer in her mother; Stroke (age of  onset: 53) in her father.   ROS:   Please see the history of present illness.    ROS All other systems reviewed and are negative.   PHYSICAL EXAM:   VS:  BP 122/72   Pulse 62   Ht 5\' 2"  (1.575 m)   Wt 145 lb 12.8 oz (66.1 kg)   BMI 26.67 kg/m    GENERAL:  Well appearing WF in NAD HEENT:  PERRL, EOMI, sclera are clear. Oropharynx is clear. NECK:  No jugular venous distention, carotid upstroke brisk and symmetric, no bruits, no thyromegaly or adenopathy LUNGS:  Clear to auscultation bilaterally CHEST:  Unremarkable HEART:  RRR,  PMI not displaced or sustained,S1 and S2 within normal limits, no S3, no S4: no clicks, no rubs, no murmurs ABD:  Soft, nontender. BS +, no masses or bruits. No hepatomegaly, no splenomegaly EXT:  2 + pulses throughout, no edema, no cyanosis no clubbing SKIN:  Warm and dry.  No rashes NEURO:  Alert and oriented x 3. Cranial nerves II through XII intact. PSYCH:  Cognitively intact    Wt Readings from Last 3 Encounters:  09/18/17 145 lb 12.8 oz (66.1 kg)  09/04/17 151 lb (68.5 kg)  05/19/17 152 lb (68.9 kg)      Studies/Labs Reviewed:   EKG:  EKG is not ordered today.  Recent Labs: 09/04/2017: ALT 14; BUN 16; Creatinine, Ser 0.73; Hemoglobin 13.9; Platelets 174.0; Potassium 4.8; Sodium 139   Lipid Panel    Component Value Date/Time   CHOL 175 09/04/2017 1135   CHOL 211 (H) 04/28/2014 1026   TRIG 80.0 09/04/2017 1135   TRIG 95 04/28/2014 1026   HDL 65.40 09/04/2017 1135   HDL 101 04/28/2014 1026   CHOLHDL 3 09/04/2017 1135   VLDL 16.0 09/04/2017 1135   LDLCALC 93 09/04/2017 1135   LDLCALC 91 04/28/2014 1026   LDLDIRECT 87.5 09/07/2012 1022    Additional studies/ records that were reviewed today include:   Echo 09/27/2015 LV EF: 55% -   60%  Study Conclusions  - Left ventricle: The cavity size was  normal. There was mild   concentric hypertrophy. Systolic function was normal. The   estimated ejection fraction was in the range of 55%  to 60%. - Aortic valve: There was mild regurgitation. - Mitral valve: Mild anterior leafelt prolapse. There was mild   regurgitation. - Atrial septum: No defect or patent foramen ovale was identified. - Pulmonary arteries: PA peak pressure: 36 mm Hg (S).   Myoview 10/18/2015 Study Highlights    The left ventricular ejection fraction is moderately decreased (30-44%).  Nuclear stress EF: 64%.  There was no ST segment deviation noted during stress.  No T wave inversion was noted during stress.  Defect 1: There is a medium defect of moderate severity.  This is an intermediate risk study.  Findings consistent with prior myocardial infarction with peri-infarct ischemia.   Moderate size and intensity partially reversible anterior and apical wall defect (SDS 4). LVEF 64% with normal wall motion. This likely represents shifting breast artifact given normal wall motion and LV function. Clinical correlation is advised. This is an intermediate risk study.      ASSESSMENT:    1. PAF (paroxysmal atrial fibrillation) (HCC)   2. Chest pain, unspecified   3. Moderate mitral regurgitation   4. Non-rheumatic mitral regurgitation   5. Mitral valve prolapse      PLAN:  In order of problems listed above:  1. PAF: Currently on flecainide, metoprolol and eliquis. Symptoms are well controlled with infrequent palpitations. No further bleeding issues. Will continue current therapy.  2. Hyperlipidemia: Last fasting lipid panel and LFTs in June 2018, cholesterol 185, HDL 85, LDL 77, triglyceride 111. Well-controlled  3. Moderate mitral regurgitation with MV prolapse: Continue to monitor. She is asymptomatic.     Medication Adjustments/Labs and Tests Ordered: Current medicines are reviewed at length with the patient today.  Concerns regarding medicines are outlined above.  Medication changes, Labs and Tests ordered today are listed in the Patient Instructions below. Patient Instructions    Continue your current therapy  I will see you in 6 months.  For a second opinion concerning your spine I would recommend Dr. Kristeen Miss # [phone]    Signed, Jehan Ranganathan Martinique, MD  09/18/2017 2:24 PM    [address] [place], [place], [place]  [address] Phone: [phone]; Fax: [phone]

## 2017-09-18 ENCOUNTER — Ambulatory Visit (INDEPENDENT_AMBULATORY_CARE_PROVIDER_SITE_OTHER): Payer: Medicare Other | Admitting: Cardiology

## 2017-09-18 ENCOUNTER — Encounter: Payer: Self-pay | Admitting: Cardiology

## 2017-09-18 VITALS — BP 122/72 | HR 62 | Ht 62.0 in | Wt 145.8 lb

## 2017-09-18 DIAGNOSIS — I341 Nonrheumatic mitral (valve) prolapse: Secondary | ICD-10-CM | POA: Diagnosis not present

## 2017-09-18 DIAGNOSIS — I340 Nonrheumatic mitral (valve) insufficiency: Secondary | ICD-10-CM

## 2017-09-18 DIAGNOSIS — R079 Chest pain, unspecified: Secondary | ICD-10-CM

## 2017-09-18 DIAGNOSIS — I480 Paroxysmal atrial fibrillation: Secondary | ICD-10-CM

## 2017-09-18 MED ORDER — NITROGLYCERIN 0.4 MG SL SUBL: 0.4000 mg | SUBLINGUAL_TABLET | SUBLINGUAL | 5 refills | Status: DC | PRN

## 2017-09-18 NOTE — Patient Instructions (Signed)
Continue your current therapy  I will see you in 6 months.  For a second opinion concerning your spine I would recommend Dr. Kristeen Miss # [phone]

## 2017-10-03 ENCOUNTER — Other Ambulatory Visit: Payer: Self-pay | Admitting: Neurology

## 2017-10-03 DIAGNOSIS — G609 Hereditary and idiopathic neuropathy, unspecified: Secondary | ICD-10-CM

## 2017-10-05 DIAGNOSIS — C44319 Basal cell carcinoma of skin of other parts of face: Secondary | ICD-10-CM | POA: Diagnosis not present

## 2017-11-02 DIAGNOSIS — M47816 Spondylosis without myelopathy or radiculopathy, lumbar region: Secondary | ICD-10-CM | POA: Diagnosis not present

## 2017-11-02 DIAGNOSIS — G894 Chronic pain syndrome: Secondary | ICD-10-CM | POA: Diagnosis not present

## 2017-11-02 DIAGNOSIS — M4120 Other idiopathic scoliosis, site unspecified: Secondary | ICD-10-CM | POA: Diagnosis not present

## 2017-11-02 DIAGNOSIS — Z79891 Long term (current) use of opiate analgesic: Secondary | ICD-10-CM | POA: Diagnosis not present

## 2017-11-02 DIAGNOSIS — M545 Low back pain: Secondary | ICD-10-CM | POA: Diagnosis not present

## 2017-11-02 DIAGNOSIS — Z79899 Other long term (current) drug therapy: Secondary | ICD-10-CM | POA: Diagnosis not present

## 2017-11-05 ENCOUNTER — Other Ambulatory Visit: Payer: Self-pay | Admitting: Cardiology

## 2017-11-19 ENCOUNTER — Other Ambulatory Visit: Payer: Self-pay | Admitting: Family Medicine

## 2017-11-19 DIAGNOSIS — I4891 Unspecified atrial fibrillation: Secondary | ICD-10-CM

## 2017-11-23 ENCOUNTER — Ambulatory Visit: Payer: Medicare Other | Admitting: Adult Health

## 2017-12-02 ENCOUNTER — Other Ambulatory Visit: Payer: Self-pay | Admitting: Family Medicine

## 2017-12-02 NOTE — Telephone Encounter (Signed)
[facility]  RF request for cyanocobalamin LOV: 09/04/17 Next ov: 03/05/18 Last written: 11/07/16 #69ml w/ 1RF  Please advise. Thanks.

## 2017-12-04 ENCOUNTER — Other Ambulatory Visit: Payer: Self-pay | Admitting: General Surgery

## 2017-12-04 DIAGNOSIS — N6489 Other specified disorders of breast: Secondary | ICD-10-CM

## 2017-12-11 ENCOUNTER — Ambulatory Visit
Admission: RE | Admit: 2017-12-11 | Discharge: 2017-12-11 | Disposition: A | Discharge status: Home / Self Care | Payer: Medicare Other | Source: Ambulatory Visit | Attending: General Surgery | Admitting: General Surgery

## 2017-12-11 ENCOUNTER — Ambulatory Visit: Payer: Medicare Other

## 2017-12-11 DIAGNOSIS — R928 Other abnormal and inconclusive findings on diagnostic imaging of breast: Secondary | ICD-10-CM | POA: Diagnosis not present

## 2017-12-11 DIAGNOSIS — N6489 Other specified disorders of breast: Secondary | ICD-10-CM

## 2017-12-14 ENCOUNTER — Ambulatory Visit (INDEPENDENT_AMBULATORY_CARE_PROVIDER_SITE_OTHER): Payer: Medicare Other | Admitting: Neurology

## 2017-12-14 ENCOUNTER — Encounter: Payer: Self-pay | Admitting: Neurology

## 2017-12-14 VITALS — BP 113/72 | HR 52 | Ht 61.0 in | Wt 152.0 lb

## 2017-12-14 DIAGNOSIS — M545 Low back pain, unspecified: Secondary | ICD-10-CM | POA: Insufficient documentation

## 2017-12-14 DIAGNOSIS — G8929 Other chronic pain: Secondary | ICD-10-CM

## 2017-12-14 DIAGNOSIS — M79671 Pain in right foot: Secondary | ICD-10-CM | POA: Diagnosis not present

## 2017-12-14 DIAGNOSIS — I480 Paroxysmal atrial fibrillation: Secondary | ICD-10-CM

## 2017-12-14 DIAGNOSIS — M79672 Pain in left foot: Secondary | ICD-10-CM | POA: Diagnosis not present

## 2017-12-14 DIAGNOSIS — G250 Essential tremor: Secondary | ICD-10-CM | POA: Diagnosis not present

## 2017-12-14 DIAGNOSIS — G629 Polyneuropathy, unspecified: Secondary | ICD-10-CM | POA: Insufficient documentation

## 2017-12-14 NOTE — Patient Instructions (Signed)
Sleep study

## 2017-12-14 NOTE — Progress Notes (Signed)
PATIENT: Cassie White DOB: August 13, 1940  REASON FOR VISIT: follow up- neuropathy HISTORY FROM: patient  HISTORY OF PRESENT ILLNESS:  10-14-2017, RV in presence of the daughter, Butch Penny. Patient with essential tremor, cystic fluid pockets, pain in both feet. Worse at night.  [name] reports that she has restless legs in the evenings, trying to sit still and watching TV is difficult.  There is also some stinging in her feet, and she has seen the podiatrist and have the cyst drained that I described above.  The draining of the cyst did not lead to better sleep or less sensitivity or dysesthesias.  She also reports an essential tremor but she does not have a masked face visit, she does not enact dreams, and there has been no rigidity.  She is still physically active, she walks.  The swelling in her feet goes down overnight and then returns as the day progresses.  She has not felt that the essential tremors have caused her social awkwardness or self-consciousness.  They are very mild here today.  She does have quite a bit of arthritic changes to the hands and finger joints.     I have the pleasure of seeing [name] today, a 77 year old Caucasian female patient with a history of neuropathy. I have followed her off and on for 11 years! 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 She begun working with a cane - single-prong. Feet are swollen. When she gets up in the morning, her right foot has a cramping pain, localized to the foot's bottom.  She would like to inquire about augmenting therapies to control the dysesthesiae as well as the cramps. She also has atrial fib, is chronically anticoagulated and on metoprolol for rate control. She sleeps well. She snores. She is not interested in a PSG now , but may reconsider after taking to  her PCP , Dr Ernestine Conrad at [facility]. .    MM 11/2016 [name] is a 77 year old female with a history of neuropathy. She returns today for follow-up. She continues on gabapentin 600 mg twice a day. She feels that this is working well for her she states that she still has burning and tingling in the feet however is tolerable. She does state that she's having back pain and is scheduled for radiotherapy and ablation on the lumbar spine. She states because of this pain she does have difficulty walking. But denies any falls. She also reports to pocket of fluid on both feet. She states that this has occurred in the past and she had the fluid pulled off by a physician in [place]. However she states that the fluid keeps reoccurring. She returns today for an evaluation.  HISTORY 09/10/15: Cassie White is a 77 year old female with a history of neuropathy. She returns today for follow-up. She continues to take gabapentin 300 mg one tablet at lunch , 1 tablet at dinner and 2 tablets at bedtime. She states that this is working well for her. She states occasionally she'll have some burning and tingling in the bottom of the feet but it is tolerable. She states that she is sleeping well. Denies any changes with her gait or balance. She does not use an assistive device when ambulating. She states that she did suffer a fall last week however that was because she  tripped over someone's foot. Fortunately she did not suffer any significant injuries she does have a bruise on the right knee. She denies any new neurological symptoms. She returns today to have her gabapentin refilled.      REVIEW OF SYSTEMS: Out of a complete 14 system review of symptoms, the patient complains only of the following symptoms, and all other reviewed systems are negative. Foot edema, cramping, mostly right foot.  Ganglion cysts- fluid filled pockets over the ankle- may impinge a nerve.  Essential tremor. Palpitations, murmur, ringing in ears, back  pain- radiating   ALLERGIES: Allergies  Allergen Reactions  . Sulfa Antibiotics Swelling    face  . Tizanidine Other (See Comments)    delirium    HOME MEDICATIONS: Outpatient Medications Prior to Visit  Medication Sig Dispense Refill  . Biotin 5000 MCG CAPS Take 5,000 mcg by mouth daily.    . Calcium Carbonate (CALCIUM 600 PO) Take 600 mg by mouth 2 (two) times daily.     . cholecalciferol (VITAMIN D) 1000 UNITS tablet Take 5,000 Units by mouth daily.     . cyanocobalamin (,VITAMIN B-12,) 1000 MCG/ML injection INJECT 1 ML (1,000 MCG TOTAL) INTO THE MUSCLE EVERY 30 (THIRTY) DAYS. 10 mL 1  . ELIQUIS 5 MG TABS tablet TAKE 1 TABLET BY MOUTH TWICE A DAY 180 tablet 1  . flecainide (TAMBOCOR) 50 MG tablet Take 1 tablet (50 mg total) by mouth 2 (two) times daily. 180 tablet 2  . gabapentin (NEURONTIN) 300 MG capsule TAKE 1 CAPSULE BY MOUTH AT LUNCH, 1 AT DINNER, AND 2 AT BEDTIME 360 capsule 2  . HYDROcodone-acetaminophen (NORCO) 7.5-325 MG tablet Take 1-2 tablets by mouth every 4 (four) hours as needed for moderate pain. 100 tablet 0  . metoprolol tartrate (LOPRESSOR) 25 MG tablet TAKE 1 TABLET BY MOUTH TWICE A DAY 180 tablet 0  . nitroGLYCERIN (NITROSTAT) 0.4 MG SL tablet Place 1 tablet (0.4 mg total) under the tongue every 5 (five) minutes as needed for chest pain. 25 tablet 5  . Omega-3 Fatty Acids (FISH OIL) 1200 MG CAPS Take 1,000 mg by mouth 2 (two) times daily.     . pravastatin (PRAVACHOL) 40 MG tablet TAKE 1 TABLET BY MOUTH EVERY DAY 30 tablet 6  . cyclobenzaprine (FLEXERIL) 5 MG tablet Use one at night to allow relaxation of muscle spasms. 30 tablet 1   No facility-administered medications prior to visit.     PAST MEDICAL HISTORY: Past Medical History:  Diagnosis Date  . Abnormal mammogram of left breast    Likely benign microcalcifications--repeat L diag mammo 03/24/2017.  COMPLEX SCLEROSING LESION WITH CALCIFICATIONS --no sign of malignancy.  Excision recommended--pt has been  referred to Dr. Donne Hazel.  . Arthritis    DJD, back and both hips  . Chronic low back pain without sciatica    Lumbar spondylosis + scoliosis.  Summer 2017 Dr. Nelva Bush did ESI and pt got no relief.  Pt then saw Dr. Maia Petties [facility]: felt she had facet mediated pain; plan for B L345 MBB (??).  Marland Kitchen Diverticulosis    on colonoscopy 2004  . GERD (gastroesophageal reflux disease)   . Hx of cardiovascular stress test    Lexiscan Myoview (2/14):  Low risk, small ant defect likely breast attenuation, no ischemia, EF 69% (no change from 2010).    . Hyperlipidemia   . Hypertension    per pt  . Idiopathic scoliosis of thoracolumbar region   . Lumbar back pain   .  Mitral regurgitation    Echo (2/14):  EF 60-65%, Gr 1 DD, mild AI, mild bileaflet MVP, mod post directed MR, mild LAE, PASP 35.  Marland Kitchen MVP (mitral valve prolapse)   . Osteoporosis, senile    stable at the left hip 2014-2015 (T-score -2.7 when she stopped bisphosphonate).  Took fosamax for 10 yrs.  Repeat DEXA 71mo later (09/2016) T-score -.3.1 (different machine, though).  Marland Kitchen PAF (paroxysmal atrial fibrillation) ([facility])    Dr. [place]: Flecainide, metopr, apxiaban.  . Peripheral neuropathy 2008   Idiopathic vs familial (motor>sensory) vs lumbar disc dz: gabapentin helpful  . Shoulder pain left   RC surg 09/2014    PAST SURGICAL HISTORY: Past Surgical History:  Procedure Laterality Date  . BREAST BIOPSY  04/10/2017   Left breast core needle biopsy of calcifications.  COMPLEX SCLEROSING LESION WITH CALCIFICATIONS --no sign of malignancy.  Excision recommended--pt has been referred to Dr. Donne Hazel.  Marland Kitchen BREAST EXCISIONAL BIOPSY Right 2010   Benign  . BREAST SURGERY  Nov 2010   Benign biopsy, right  . CHOLECYSTECTOMY  03/2010   Dr.Gross  . COLONOSCOPY  10/03/2002   No polyps.  Repeat 10 yrs recommended but pt declines.  Pt declined cologuard 08/2016 but changed her mind and this test was NEG on 03/23/17.  . COMBINED HYSTERECTOMY  VAGINAL / OOPHORECTOMY / A&P REPAIR  6606   uncertain if ovaries were removed or not  . CYSTOCELE REPAIR    . Lumpectomy  06/2009   "Fatty Necrosis"  . PFT's  2014   NORMAL  . ROTATOR CUFF REPAIR Left 10/02/14   Dr. Veverly Fells  . TONSILLECTOMY    . TOTAL KNEE ARTHROPLASTY  09/2009   Right ;Dr Alvan Dame  . TOTAL KNEE ARTHROPLASTY Left 03/10/2016   Procedure: TOTAL KNEE ARTHROPLASTY;  Surgeon: Paralee Cancel, MD;  Location: [facility];  Service: Orthopedics;  Laterality: Left;  . TRANSTHORACIC ECHOCARDIOGRAM  09/2012; 09/27/15   2014: EF 60-65%, Gr 1 DD, mild AI, mild bileaflet MVP, mod post directed MR, mild LAE, PASP 35.  Repeat 2017: EF 55-60%, mild AR, mild MVP and mild MV regurg.  Marland Kitchen VAGINAL HYSTERECTOMY     For Uterine Deviation     FAMILY HISTORY: Family History  Problem Relation Age of Onset  . Heart attack Father        in 31s  . Stroke Father 29  . Ovarian cancer Mother   . Heart attack Son 62       Smoker  . Coronary artery disease Brother   . Diabetes Neg Hx     SOCIAL HISTORY: Social History   Socioeconomic History  . Marital status: Married    Spouse name: Not on file  . Number of children: 3  . Years of education: HS grad  . Highest education level: Not on file  Occupational History  . 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 Financial resource strain: Not on file  . Food insecurity:    Worry: Not on file    Inability: Not on file  . Transportation needs:    Medical: Not on file    Non-medical: Not on file  Tobacco Use  . Smoking status: Never Smoker  . Smokeless tobacco: Never Used  Substance and Sexual Activity  . Alcohol use: No    Alcohol/week: 0.0 oz  . Drug use: No  . Sexual activity: Never    Birth control/protection: Post-menopausal  Lifestyle  . Physical activity:    Days per  week: Not on file    Minutes per session: Not on file  . Stress: Not on file  Relationships  . Social connections:    Talks on phone: Not on file     Gets together: Not on file    Attends religious service: Not on file    Active member of club or organization: Not on file    Attends meetings of clubs or organizations: Not on file    Relationship status: Not on file  . Intimate partner violence:    Fear of current or ex partner: Not on file    Emotionally abused: Not on file    Physically abused: Not on file    Forced sexual activity: Not on file  Other Topics Concern  . Not on file  Social History Narrative   Widow, has 3 children, 4 grandchildren.   Orig from [place].   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   Caffiene, 1 cup daily avg.   No tob, no alc, no drugs.   Exercise: walking, limited by bilat hip pain.      PHYSICAL EXAM  Vitals:   12/14/17 0938  BP: 113/72  Pulse: (!) 52  Weight: 152 lb (68.9 kg)  Height: 5\' 1"  (1.549 m)   Body mass index is 28.72 kg/m.   Atrial fibrillation- slow heart rate, regular , on Metoprolol and Lactamide.   Retrognathia.   Generalized: Well developed, in no acute distress, Lower extremities: Two fluid filled pockets on the top of the right and left foot ankles lateral , No cyanosis, normal pulses, warm feet. Good pulses and capillary refill.   Mild dysphonia.   Neurological examination  Mentation: Alert oriented to time, place, history taking. Follows all commands speech and language fluent Cranial nerve : intact taste and smell- Pupils were equal round reactive to light. Biological lenses.  Uvula and  tongue move in midline. Head turning and shoulder shrug are symmetric. Motor:  5 / 5 strength of all 4 extremities with symmetric motor tone is noted throughout. Grip strength is weaker. She has  arthritis changes to fingers and hand joints bilaterally. She has cystic fluid pockets on her lateral ankles.    Sensory: Sensory testing is intact - to vibration and fine touch. temperature  Coordination:  good finger-nose-finger movements. Mild  tremor, no cog wheeling.  DTR 2/2  Loss of patella reflexes.  Gait and station: Gait is normal. Tandem gait is unsteady. Romberg is negative. No drift is seen.    DIAGNOSTIC DATA (LABS, IMAGING, TESTING) - I reviewed patient records, labs, notes, testing and imaging myself where available.  Lab Results  Component Value Date   WBC 5.1 09/04/2017   HGB 13.9 09/04/2017   HCT 41.4 09/04/2017   MCV 90.5 09/04/2017   PLT 174.0 09/04/2017      Component Value Date/Time   NA 139 09/04/2017 1135   K 4.8 09/04/2017 1135   CL 105 09/04/2017 1135   CO2 28 09/04/2017 1135   GLUCOSE 89 09/04/2017 1135   BUN 16 09/04/2017 1135   CREATININE 0.73 09/04/2017 1135   CALCIUM 9.9 09/04/2017 1135   PROT 6.6 09/04/2017 1135   ALBUMIN 4.2 09/04/2017 1135   AST 16 09/04/2017 1135   ALT 14 09/04/2017 1135   ALKPHOS 64 09/04/2017 1135   BILITOT 0.8 09/04/2017 1135   GFRNONAA >60 02/14/2017 2216   GFRAA >60 02/14/2017 2216   Lab Results  Component Value Date   CHOL  175 09/04/2017   HDL 65.40 09/04/2017   LDLCALC 93 09/04/2017   LDLDIRECT 87.5 09/07/2012   TRIG 80.0 09/04/2017   CHOLHDL 3 09/04/2017   Lab Results  Component Value Date   HGBA1C  09/19/2010    5.3 (NOTE)                                                                       According to the ADA Clinical Practice Recommendations for 2011, when HbA1c is used as a screening test:   >=6.5%   Diagnostic of Diabetes Mellitus           (if abnormal result  is confirmed)  5.7-6.4%   Increased risk of developing Diabetes Mellitus  References:Diagnosis and Classification of Diabetes Mellitus,Diabetes HYQM,5784,69(GEXBM 1):S62-S69 and Standards of Medical Care in         Diabetes - 2011,Diabetes Care,2011,34  (Suppl 1):S11-S61.   Lab Results  Component Value Date   VITAMINB12 1,250 (H) 04/28/2014   Lab Results  Component Value Date   TSH 1.63 09/24/2015    ASSESSMENT AND PLAN  35 minute RV- more than 50% dediated to discussion  and explanation of current neurological condition. Treatment options.   77 y.o. year old female  has a past medical history of Abnormal mammogram of left breast, Arthritis, Chronic low back pain without sciatica, Diverticulosis, GERD (gastroesophageal reflux disease), cardiovascular stress test, Hyperlipidemia, Hypertension, Idiopathic scoliosis of thoracolumbar region, Lumbar back pain, Mitral regurgitation, MVP (mitral valve prolapse), Osteoporosis, senile, PAF (paroxysmal atrial fibrillation) ([facility]), Peripheral neuropathy (2008), and Shoulder pain (left). here with:  1. Neuropathy ( NP ) , pain full- partially related to DDD lumbar area? Non diabetic neuropathy- and worse at night.   Responding to Neurontin/ gabapentin. We changed the intake time, not to take in AM only PM 300 mg and at bedtime, 600 mg.  2. Atrial fib on chronic anticoagulation, Flecainide, metoprolol. 3. Essential tremor, very mild but more on the dominant hand. She reports no abnormalities of REM sleep( but lives alone), no masked face. No trouble swallowing.   She will continue on gabapentin 600 mg twice a day. It appears that she may have possible synovial cysts on the top of the right and left foot. Draining the cysts has not helped the NP.  She agreed to a HST to screen for OSA in atrial fib.    Larey Seat, MD  12/14/2017, 9:51 AM [facility] [address] [place], [place] [address] [phone]

## 2017-12-17 ENCOUNTER — Other Ambulatory Visit: Payer: Self-pay | Admitting: Neurology

## 2017-12-17 ENCOUNTER — Telehealth: Payer: Self-pay

## 2017-12-17 DIAGNOSIS — R0683 Snoring: Secondary | ICD-10-CM

## 2017-12-17 NOTE — Telephone Encounter (Signed)
Order is placed.

## 2017-12-17 NOTE — Telephone Encounter (Signed)
Medicare will not pay for a home sleep study due to A-Fib. She will need a Split night ordered. Spoke with Dr. Brett Fairy and she approved this change.

## 2017-12-22 ENCOUNTER — Telehealth: Payer: Self-pay

## 2017-12-22 NOTE — Telephone Encounter (Signed)
Called patient to schedule in lab sleep study.  Pt stated that she has thought about it and is not interested in performing any type of sleep study.

## 2018-01-07 ENCOUNTER — Encounter: Payer: Self-pay | Admitting: Family Medicine

## 2018-01-19 ENCOUNTER — Encounter: Payer: Self-pay | Admitting: Cardiovascular Disease

## 2018-01-31 ENCOUNTER — Other Ambulatory Visit: Payer: Self-pay | Admitting: Cardiology

## 2018-02-16 ENCOUNTER — Other Ambulatory Visit: Payer: Self-pay | Admitting: Family Medicine

## 2018-02-16 DIAGNOSIS — I4891 Unspecified atrial fibrillation: Secondary | ICD-10-CM

## 2018-03-05 ENCOUNTER — Ambulatory Visit: Payer: Medicare Other | Admitting: Family Medicine

## 2018-03-19 ENCOUNTER — Ambulatory Visit: Payer: Medicare Other | Admitting: Family Medicine

## 2018-03-26 ENCOUNTER — Ambulatory Visit (INDEPENDENT_AMBULATORY_CARE_PROVIDER_SITE_OTHER): Payer: Medicare Other | Admitting: Family Medicine

## 2018-03-26 ENCOUNTER — Encounter: Payer: Self-pay | Admitting: Family Medicine

## 2018-03-26 VITALS — BP 127/62 | HR 48 | Temp 98.1°F | Resp 16 | Ht 61.0 in | Wt 150.0 lb

## 2018-03-26 DIAGNOSIS — E7800 Pure hypercholesterolemia, unspecified: Secondary | ICD-10-CM | POA: Diagnosis not present

## 2018-03-26 DIAGNOSIS — G2571 Drug induced akathisia: Secondary | ICD-10-CM

## 2018-03-26 DIAGNOSIS — R251 Tremor, unspecified: Secondary | ICD-10-CM

## 2018-03-26 DIAGNOSIS — I10 Essential (primary) hypertension: Secondary | ICD-10-CM | POA: Diagnosis not present

## 2018-03-26 DIAGNOSIS — E663 Overweight: Secondary | ICD-10-CM | POA: Diagnosis not present

## 2018-03-26 DIAGNOSIS — T50905A Adverse effect of unspecified drugs, medicaments and biological substances, initial encounter: Secondary | ICD-10-CM | POA: Diagnosis not present

## 2018-03-26 LAB — BASIC METABOLIC PANEL
BUN: 15 mg/dL (ref 6–23)
CALCIUM: 10.6 mg/dL — AB (ref 8.4–10.5)
CO2: 31 meq/L (ref 19–32)
CREATININE: 0.82 mg/dL (ref 0.40–1.20)
Chloride: 103 mEq/L (ref 96–112)
GFR: 71.86 mL/min (ref >60.00)
GLUCOSE: 90 mg/dL (ref 70–99)
Potassium: 4.5 mEq/L (ref 3.5–5.1)
Sodium: 139 mEq/L (ref 135–145)

## 2018-03-26 LAB — LIPID PANEL
CHOL/HDL RATIO: 2
Cholesterol: 186 mg/dL (ref 0–200)
HDL: 75.9 mg/dL (ref >39.00)
LDL Cholesterol: 83 mg/dL (ref 0–99)
NONHDL: 109.69
Triglycerides: 133 mg/dL (ref 0.0–149.0)
VLDL: 26.6 mg/dL (ref 0.0–40.0)

## 2018-03-26 NOTE — Patient Instructions (Signed)
Take one gabapentin tab twice per day for 2 weeks, then take 1 gabapentin tab once per day for 2 weeks, then take 1 gabapentin tab once every other day for 5 doses, then stop this med completely.

## 2018-03-26 NOTE — Progress Notes (Signed)
OFFICE VISIT  03/26/2018   CC:  Chief Complaint  Patient presents with  . Follow-up    RCI, pt is fasting.     HPI:    Patient is a 77 y.o. Caucasian female who presents for 6 mo f/u HTN, HLD, tremor.  Labs all normal 6 mo ago (CBC, CMET, FLP).  HTN: occ bp check at home is <130/80.  HLD: tolerating daily statin.  She is concerned about gabapentin being the cause of her tremor, says she did not have tremor before getting on this med.  Also some akathesia sensation since getting on gabapentin.  Has been on this med long term for neuropathy pain but says she doesn't think it has helped her much.   Tremor is in hands, R>L, gets worse as day goes on.   Takes 2 of the 300mg  gabapentin twice a day. PN pain managed by Dr. Brett Fairy but pt states she only goes to her on a prn basis.  ROS: no CP, no SOB, no wheezing, no cough, no dizziness, no HAs, no rashes, no melena/hematochezia.  No polyuria or polydipsia.  +chronic LBP.  Past Medical History:  Diagnosis Date  . Abnormal mammogram of left breast    Likely benign microcalcifications--repeat L diag mammo 03/24/2017.  COMPLEX SCLEROSING LESION WITH CALCIFICATIONS --no sign of malignancy.  Excision recommended--pt has been referred to Dr. Donne Hazel, who recommended f/u mammo and this was normal 12/2017--consider rpt 1 yr per rad.  . Arthritis    DJD, back and both hips  . Chronic low back pain without sciatica    Lumbar spondylosis + scoliosis.  Summer 2017 Dr. Nelva Bush did ESI and pt got no relief.  Pt then saw Dr. Maia Petties [facility]: felt she had facet mediated pain; plan for B L345 MBB (??).  Marland Kitchen Diverticulosis    on colonoscopy 2004  . GERD (gastroesophageal reflux disease)   . Hx of cardiovascular stress test    Lexiscan Myoview (2/14):  Low risk, small ant defect likely breast attenuation, no ischemia, EF 69% (no change from 2010).    . Hyperlipidemia   . Hypertension    per pt  . Idiopathic scoliosis of thoracolumbar  region   . Lumbar back pain   . Mitral regurgitation    Echo (2/14):  EF 60-65%, Gr 1 DD, mild AI, mild bileaflet MVP, mod post directed MR, mild LAE, PASP 35.  Marland Kitchen MVP (mitral valve prolapse)   . 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  Responding to gabapentin (Dr. Brett Fairy).  . Osteoporosis, senile    stable at the left hip 2014-2015 (T-score -2.7 when she stopped bisphosphonate).  Took fosamax for 10 yrs.  Repeat DEXA 86mo later (09/2016) T-score -.3.1 (different machine, though).  Marland Kitchen PAF (paroxysmal atrial fibrillation) (HCC)    Dr. Martinique: Flecainide, metopr, apxiaban.  . Peripheral neuropathy 2008   Idiopathic vs familial (motor>sensory) vs lumbar disc dz: gabapentin helpful  . Shoulder pain left   RC surg 09/2014    Past Surgical History:  Procedure Laterality Date  . BREAST BIOPSY  04/10/2017   Left breast core needle biopsy of calcifications.  COMPLEX SCLEROSING LESION WITH CALCIFICATIONS --no sign of malignancy.  Excision recommended--pt has been referred to Dr. Donne Hazel, who recommended rpt mammo and this was NORMAL 12/2017.  Marland Kitchen BREAST EXCISIONAL BIOPSY Right 2010   Benign  . BREAST SURGERY  Nov 2010   Benign biopsy, right  . CHOLECYSTECTOMY  03/2010   Dr.Gross  .  COLONOSCOPY  10/03/2002   No polyps.  Repeat 10 yrs recommended but pt declines.  Pt declined cologuard 08/2016 but changed her mind and this test was NEG on 03/23/17.  . COMBINED HYSTERECTOMY VAGINAL / OOPHORECTOMY / A&P REPAIR  4854   uncertain if ovaries were removed or not  . CYSTOCELE REPAIR    . Lumpectomy  06/2009   "Fatty Necrosis"  . PFT's  2014   NORMAL  . ROTATOR CUFF REPAIR Left 10/02/14   Dr. Veverly Fells  . TONSILLECTOMY    . TOTAL KNEE ARTHROPLASTY  09/2009   Right ;Dr Alvan Dame  . TOTAL KNEE ARTHROPLASTY Left 03/10/2016   Procedure: TOTAL KNEE ARTHROPLASTY;  Surgeon: Paralee Cancel, MD;  Location: [facility];  Service: Orthopedics;  Laterality: Left;  .  TRANSTHORACIC ECHOCARDIOGRAM  09/2012; 09/27/15   2014: EF 60-65%, Gr 1 DD, mild AI, mild bileaflet MVP, mod post directed MR, mild LAE, PASP 35.  Repeat 2017: EF 55-60%, mild AR, mild MVP and mild MV regurg.  Marland Kitchen VAGINAL HYSTERECTOMY     For Uterine Deviation     Outpatient Medications Prior to Visit  Medication Sig Dispense Refill  . Biotin 5000 MCG CAPS Take 5,000 mcg by mouth daily.    . Calcium Carbonate (CALCIUM 600 PO) Take 600 mg by mouth 2 (two) times daily.     . cholecalciferol (VITAMIN D) 1000 UNITS tablet Take 5,000 Units by mouth daily.     . cyanocobalamin (,VITAMIN B-12,) 1000 MCG/ML injection INJECT 1 ML (1,000 MCG TOTAL) INTO THE MUSCLE EVERY 30 (THIRTY) DAYS. 10 mL 1  . ELIQUIS 5 MG TABS tablet TAKE 1 TABLET BY MOUTH TWICE A DAY 60 tablet 5  . flecainide (TAMBOCOR) 50 MG tablet Take 1 tablet (50 mg total) by mouth 2 (two) times daily. 180 tablet 2  . gabapentin (NEURONTIN) 300 MG capsule TAKE 1 CAPSULE BY MOUTH AT LUNCH, 1 AT DINNER, AND 2 AT BEDTIME 360 capsule 2  . HYDROcodone-acetaminophen (NORCO) 7.5-325 MG tablet Take 1-2 tablets by mouth every 4 (four) hours as needed for moderate pain. 100 tablet 0  . metoprolol tartrate (LOPRESSOR) 25 MG tablet TAKE 1 TABLET BY MOUTH TWICE A DAY 180 tablet 1  . nitroGLYCERIN (NITROSTAT) 0.4 MG SL tablet Place 1 tablet (0.4 mg total) under the tongue every 5 (five) minutes as needed for chest pain. 25 tablet 5  . Omega-3 Fatty Acids (FISH OIL) 1200 MG CAPS Take 1,000 mg by mouth 2 (two) times daily.     . pravastatin (PRAVACHOL) 40 MG tablet TAKE 1 TABLET BY MOUTH EVERY DAY 30 tablet 6   No facility-administered medications prior to visit.     Allergies  Allergen Reactions  . Sulfa Antibiotics Swelling    face  . Tizanidine Other (See Comments)    delirium    ROS As per HPI  PE: Blood pressure 127/62, pulse (!) 48, temperature 98.1 F (36.7 C), temperature source Oral, resp. rate 16, height 5\' 1"  (1.549 m), weight 150 lb (68  kg), SpO2 95 %. Gen: Alert, well appearing.  Patient is oriented to person, place, time, and situation. AFFECT: pleasant, lucid thought and speech. CV: Regular, bradycardia to 45-50 range, S1 a bit quieter than S2, with soft systolic murmur.  No diastolic murmur.  No r/g.   LUNGS: CTA bilat, nonlabored resps, good aeration in all lung fields. EXT: no clubbing or cyanosis.  no edema.  Neuro: CN 2-12 intact bilaterally, strength 5/5 in proximal and distal upper extremities  and lower extremities bilaterally.  No sensory deficits.  Fine tremor in both hands with arms held outstretched, R>L.  No pill rolling or intention tremor. No ataxia.  No pronator drift.  No bradykinesia, shuffling gait, or mask-like facies.   LABS:  Lab Results  Component Value Date   TSH 1.63 09/24/2015   Lab Results  Component Value Date   WBC 5.1 09/04/2017   HGB 13.9 09/04/2017   HCT 41.4 09/04/2017   MCV 90.5 09/04/2017   PLT 174.0 09/04/2017   Lab Results  Component Value Date   CREATININE 0.73 09/04/2017   BUN 16 09/04/2017   NA 139 09/04/2017   K 4.8 09/04/2017   CL 105 09/04/2017   CO2 28 09/04/2017   Lab Results  Component Value Date   ALT 14 09/04/2017   AST 16 09/04/2017   ALKPHOS 64 09/04/2017   BILITOT 0.8 09/04/2017   Lab Results  Component Value Date   CHOL 175 09/04/2017   Lab Results  Component Value Date   HDL 65.40 09/04/2017   Lab Results  Component Value Date   LDLCALC 93 09/04/2017   Lab Results  Component Value Date   TRIG 80.0 09/04/2017   Lab Results  Component Value Date   CHOLHDL 3 09/04/2017   Lab Results  Component Value Date   HGBA1C  09/19/2010    5.3 (NOTE)                                                                       According to the ADA Clinical Practice Recommendations for 2011, when HbA1c is used as a screening test:   >=6.5%   Diagnostic of Diabetes Mellitus           (if abnormal result  is confirmed)  5.7-6.4%   Increased risk of  developing Diabetes Mellitus  References:Diagnosis and Classification of Diabetes Mellitus,Diabetes YYTK,3546,56(CLEXN 1):S62-S69 and Standards of Medical Care in         Diabetes - 2011,Diabetes Care,2011,34  (Suppl 1):S11-S61.    IMPRESSION AND PLAN:  1) Tremor: per pt, this did not start until she got on gabapentin. Gabapentin not particularly helpful for her neuropathic pain. Plan is to ween off gabapentin and see how things go. Instructions: Take one gabapentin tab twice per day for 2 weeks, then take 1 gabapentin tab once per day for 2 weeks, then take 1 gabapentin tab once every other day for 5 doses, then stop this med completely.  2) HTN: The current medical regimen is effective;  continue present plan and medications. Lytes/cr today.  3) hypercholesterolemia: tolerating statin. FLP today.  4) Preventative health care: Cervical ca screening: n/a due to hysterectomy for benign reason. Breast ca screening: abnl mammo 03/2017 (L breast): bx showed benign features but excision was recommended---was referred to Gen surgeon (Dr. Donne Hazel).  Surgeon repeated mammo 12/2017 and it was normal.  An After Visit Summary was printed and given to the patient.  FOLLOW UP: Return in about 6 months (around 09/26/2018) for routine chronic illness f/u.  Signed:  Crissie Sickles, MD           03/26/2018

## 2018-04-28 ENCOUNTER — Encounter: Payer: Self-pay | Admitting: Adult Health

## 2018-04-28 ENCOUNTER — Ambulatory Visit (INDEPENDENT_AMBULATORY_CARE_PROVIDER_SITE_OTHER): Payer: Medicare Other | Admitting: Adult Health

## 2018-04-28 VITALS — BP 130/74 | HR 74 | Ht 61.0 in | Wt 154.0 lb

## 2018-04-28 DIAGNOSIS — G629 Polyneuropathy, unspecified: Secondary | ICD-10-CM

## 2018-04-28 NOTE — Progress Notes (Signed)
Cassie White: Cassie White DOB: 07-23-1941  REASON FOR VISIT: follow up HISTORY FROM: Cassie White  HISTORY OF PRESENT ILLNESS: Today 04/28/18: Cassie White is a 77 year old female with a history of neuropathy.  Cassie White returns today for follow-up.  Cassie White is currently on gabapentin taking 300 mg 3 times a day and 600 mg at bedtime.  Cassie White also uses an over-the-counter cream.  Cassie White reports that this is helpful but does not resolve Cassie White neuropathy.  He states that Cassie White has burning and tingling is located in the feet.  Cassie White states that the pain is worse with activity.  Denies any trouble sleeping.  Denies any falls but does note that Cassie White balance is off.  Cassie White reports that Cassie White does have a cane and walker but does not typically use them.  Cassie White reports that Cassie White uses the furniture to get around at home.  Cassie White returns today for evaluation.  HISTORY Cassie White is a 77 year old female with a history of neuropathy. Cassie White returns today for follow-up. Cassie White continues on gabapentin 600 mg twice a day. Cassie White feels that this is working well for Cassie White Cassie White states that Cassie White still has burning and tingling in the feet however is tolerable. Cassie White does state that Cassie White's having back pain and is scheduled for radiotherapy and ablation on the lumbar spine. Cassie White states because of this pain Cassie White does have difficulty walking. But denies any falls. Cassie White also reports to pocket of fluid on both feet. Cassie White states that this has occurred in the past and Cassie White had the fluid pulled off by a physician in [place]. However Cassie White states that the fluid keeps reoccurring. Cassie White returns today for an evaluation.  REVIEW OF SYSTEMS: Out of a complete 14 system review of symptoms, the Cassie White complains only of the following symptoms, and all other reviewed systems are negative.  Ringing in ears, runny nose, palpitations, murmur, walking difficulty, tremors, back pain, joint pain  ALLERGIES: Allergies  Allergen Reactions  . Sulfa Antibiotics Swelling    face  . Tizanidine Other  (See Comments)    delirium    HOME MEDICATIONS: Outpatient Medications Prior to Visit  Medication Sig Dispense Refill  . Biotin 5000 MCG CAPS Take 5,000 mcg by mouth daily.    . Calcium Carbonate (CALCIUM 600 PO) Take 600 mg by mouth 2 (two) times daily.     . cholecalciferol (VITAMIN D) 1000 UNITS tablet Take 5,000 Units by mouth daily.     . cyanocobalamin (,VITAMIN B-12,) 1000 MCG/ML injection INJECT 1 ML (1,000 MCG TOTAL) INTO THE MUSCLE EVERY 30 (THIRTY) DAYS. 10 mL 1  . ELIQUIS 5 MG TABS tablet TAKE 1 TABLET BY MOUTH TWICE A DAY 60 tablet 5  . flecainide (TAMBOCOR) 50 MG tablet Take 1 tablet (50 mg total) by mouth 2 (two) times daily. 180 tablet 2  . gabapentin (NEURONTIN) 300 MG capsule TAKE 1 CAPSULE BY MOUTH AT LUNCH, 1 AT DINNER, AND 2 AT BEDTIME 360 capsule 2  . HYDROcodone-acetaminophen (NORCO) 7.5-325 MG tablet Take 1-2 tablets by mouth every 4 (four) hours as needed for moderate pain. 100 tablet 0  . metoprolol tartrate (LOPRESSOR) 25 MG tablet TAKE 1 TABLET BY MOUTH TWICE A DAY 180 tablet 1  . nitroGLYCERIN (NITROSTAT) 0.4 MG SL tablet Place 1 tablet (0.4 mg total) under the tongue every 5 (five) minutes as needed for chest pain. 25 tablet 5  . Omega-3 Fatty Acids (FISH OIL) 1200 MG CAPS Take 1,000 mg by mouth 2 (two) times  daily.     . pravastatin (PRAVACHOL) 40 MG tablet TAKE 1 TABLET BY MOUTH EVERY DAY 30 tablet 6   No facility-administered medications prior to visit.     PAST MEDICAL HISTORY: Past Medical History:  Diagnosis Date  . Abnormal mammogram of left breast    Likely benign microcalcifications--repeat L diag mammo 03/24/2017.  COMPLEX SCLEROSING LESION WITH CALCIFICATIONS --no sign of malignancy.  Excision recommended--pt has been referred to Dr. Donne Hazel, who recommended f/u mammo and this was normal 12/2017--consider rpt 1 yr per rad.  . Arthritis    DJD, back and both hips  . Chronic low back pain without sciatica    Lumbar spondylosis + scoliosis.  Summer  2017 Dr. Nelva Bush did ESI and pt got no relief.  Pt then saw Dr. Maia Petties [facility]: felt Cassie White had facet mediated pain; plan for B L345 MBB (??).  Marland Kitchen Diverticulosis    on colonoscopy 2004  . GERD (gastroesophageal reflux disease)   . Hx of cardiovascular stress test    Lexiscan Myoview (2/14):  Low risk, small ant defect likely breast attenuation, no ischemia, EF 69% (no change from 2010).    . Hyperlipidemia   . Hypertension    per pt  . Idiopathic scoliosis of thoracolumbar region   . Lumbar back pain   . Mitral regurgitation    Echo (2/14):  EF 60-65%, Gr 1 DD, mild AI, mild bileaflet MVP, mod post directed MR, mild LAE, PASP 35.  Marland Kitchen MVP (mitral valve prolapse)   . 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  Responding to gabapentin (Dr. Brett Fairy).  . Osteoporosis, senile    stable at the left hip 2014-2015 (T-score -2.7 when Cassie White stopped bisphosphonate).  Took fosamax for 10 yrs.  Repeat DEXA 80mo later (09/2016) T-score -.3.1 (different machine, though).  Marland Kitchen PAF (paroxysmal atrial fibrillation) (HCC)    Dr. Martinique: Flecainide, metopr, apxiaban.  . Peripheral neuropathy 2008   Idiopathic vs familial (motor>sensory) vs lumbar disc dz: gabapentin helpful  . Shoulder pain left   RC surg 09/2014    PAST SURGICAL HISTORY: Past Surgical History:  Procedure Laterality Date  . BREAST BIOPSY  04/10/2017   Left breast core needle biopsy of calcifications.  COMPLEX SCLEROSING LESION WITH CALCIFICATIONS --no sign of malignancy.  Excision recommended--pt has been referred to Dr. Donne Hazel, who recommended rpt mammo and this was NORMAL 12/2017.  Marland Kitchen BREAST EXCISIONAL BIOPSY Right 2010   Benign  . BREAST SURGERY  Nov 2010   Benign biopsy, right  . CHOLECYSTECTOMY  03/2010   Dr.Gross  . COLONOSCOPY  10/03/2002   No polyps.  Repeat 10 yrs recommended but pt declines.  Pt declined cologuard 08/2016 but changed Cassie White mind and this test was NEG on  03/23/17.  . COMBINED HYSTERECTOMY VAGINAL / OOPHORECTOMY / A&P REPAIR  7893   uncertain if ovaries were removed or not  . CYSTOCELE REPAIR    . Lumpectomy  06/2009   "Fatty Necrosis"  . PFT's  2014   NORMAL  . ROTATOR CUFF REPAIR Left 10/02/14   Dr. Veverly Fells  . TONSILLECTOMY    . TOTAL KNEE ARTHROPLASTY  09/2009   Right ;Dr Alvan Dame  . TOTAL KNEE ARTHROPLASTY Left 03/10/2016   Procedure: TOTAL KNEE ARTHROPLASTY;  Surgeon: Paralee Cancel, MD;  Location: [facility];  Service: Orthopedics;  Laterality: Left;  . TRANSTHORACIC ECHOCARDIOGRAM  09/2012; 09/27/15   2014: EF 60-65%, Gr 1 DD, mild AI, mild bileaflet MVP, mod  post directed MR, mild LAE, PASP 35.  Repeat 2017: EF 55-60%, mild AR, mild MVP and mild MV regurg.  Marland Kitchen VAGINAL HYSTERECTOMY     For Uterine Deviation     FAMILY HISTORY: Family History  Problem Relation Age of Onset  . Heart attack Father        in 59s  . Stroke Father 68  . Ovarian cancer Mother   . Heart attack Son 60       Smoker  . Coronary artery disease Brother   . COPD Brother   . Diabetes Neg Hx     SOCIAL HISTORY: Social History   Socioeconomic History  . Marital status: Married    Spouse name: Not on file  . Number of children: 3  . Years of education: HS grad  . Highest education level: Not on file  Occupational History  . 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 Financial resource strain: Not on file  . Food insecurity:    Worry: Not on file    Inability: Not on file  . Transportation needs:    Medical: Not on file    Non-medical: Not on file  Tobacco Use  . Smoking status: Never Smoker  . Smokeless tobacco: Never Used  Substance and Sexual Activity  . Alcohol use: No    Alcohol/week: 0.0 standard drinks  . Drug use: No  . Sexual activity: Never    Birth control/protection: Post-menopausal  Lifestyle  . Physical activity:    Days per week: Not on file    Minutes per session: Not on file  . Stress: Not on file    Relationships  . Social connections:    Talks on phone: Not on file    Gets together: Not on file    Attends religious service: Not on file    Active member of club or organization: Not on file    Attends meetings of clubs or organizations: Not on file    Relationship status: Not on file  . Intimate partner violence:    Fear of current or ex partner: Not on file    Emotionally abused: Not on file    Physically abused: Not on file    Forced sexual activity: Not on file  Other Topics Concern  . Not on file  Social History Narrative   Widow, has 3 children, 4 grandchildren.   Orig from [place].   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   Caffiene, 1 cup daily avg.   No tob, no alc, no drugs.   Exercise: walking, limited by bilat hip pain.      PHYSICAL EXAM  Vitals:   04/28/18 1401  BP: 130/74  Pulse: 74  Weight: 154 lb (69.9 kg)  Height: 5\' 1"  (1.549 m)   Body mass index is 29.1 kg/m.  Generalized: Well developed, in no acute distress   Neurological examination  Mentation: Alert oriented to time, place, history taking. Follows all commands speech and language fluent Cranial nerve II-XII: Pupils were equal round reactive to light. Extraocular movements were full, visual field were full on confrontational test. Facial sensation and strength were normal. Uvula tongue midline. Head turning and shoulder shrug  were normal and symmetric. Motor: The motor testing reveals 5 over 5 strength of all 4 extremities. Good symmetric motor tone is noted throughout.  Sensory: Sensory testing is intact to soft touch on all 4 extremities. No evidence of extinction is noted.  Coordination: Cerebellar testing reveals good finger-nose-finger and heel-to-shin bilaterally.  Gait and station: Gait is unsteady.  Cassie White has a stooped posture.  Slight limp when ambulating.  Tandem gait not attempted. Reflexes: Deep tendon reflexes are symmetric and normal  bilaterally.   DIAGNOSTIC DATA (LABS, IMAGING, TESTING) - I reviewed Cassie White records, labs, notes, testing and imaging myself where available.  Lab Results  Component Value Date   WBC 5.1 09/04/2017   HGB 13.9 09/04/2017   HCT 41.4 09/04/2017   MCV 90.5 09/04/2017   PLT 174.0 09/04/2017      Component Value Date/Time   NA 139 03/26/2018 0946   K 4.5 03/26/2018 0946   CL 103 03/26/2018 0946   CO2 31 03/26/2018 0946   GLUCOSE 90 03/26/2018 0946   BUN 15 03/26/2018 0946   CREATININE 0.82 03/26/2018 0946   CALCIUM 10.6 (H) 03/26/2018 0946   PROT 6.6 09/04/2017 1135   ALBUMIN 4.2 09/04/2017 1135   AST 16 09/04/2017 1135   ALT 14 09/04/2017 1135   ALKPHOS 64 09/04/2017 1135   BILITOT 0.8 09/04/2017 1135   GFRNONAA >60 02/14/2017 2216   GFRAA >60 02/14/2017 2216   Lab Results  Component Value Date   CHOL 186 03/26/2018   HDL 75.90 03/26/2018   LDLCALC 83 03/26/2018   LDLDIRECT 87.5 09/07/2012   TRIG 133.0 03/26/2018   CHOLHDL 2 03/26/2018    Lab Results  Component Value Date   VITAMINB12 1,250 (H) 04/28/2014   Lab Results  Component Value Date   TSH 1.63 09/24/2015      ASSESSMENT AND PLAN 77 y.o. year old female  has a past medical history of Abnormal mammogram of left breast, Arthritis, Chronic low back pain without sciatica, Diverticulosis, GERD (gastroesophageal reflux disease), cardiovascular stress test, Hyperlipidemia, Hypertension, Idiopathic scoliosis of thoracolumbar region, Lumbar back pain, Mitral regurgitation, MVP (mitral valve prolapse), Neuropathy (2019), Osteoporosis, senile, PAF (paroxysmal atrial fibrillation) ([place]), Peripheral neuropathy (2008), and Shoulder pain (left). here with:  1.  Peripheral neuropathy  Overall the Cassie White has remained stable.  Cassie White will continue on gabapentin 300 mg 3 times a day and 600 mg at bedtime.  I will order compounded cream to see if this works better for Cassie White.  Cassie White is advised that if Cassie White symptoms worsen or Cassie White  develops new symptoms Cassie White should let us know.  Cassie White will follow-up in 6 months or sooner as needed.  I spent 15 minutes with the Cassie White. 50% of this time was spent discussing plan of care   Ward Givens, MSN, NP-C 04/28/2018, 2:08 PM [facility] [address], [place], [place] [address] [phone]

## 2018-04-28 NOTE — Patient Instructions (Signed)
Your Plan:  Continue gabapentin I will call in compound cream If your symptoms worsen or you develop new symptoms please let us know.   Thank you for coming to see [place] at [facility]. I hope we have been able to provide you high quality care today.  You may receive a patient satisfaction survey over the next few weeks. We would appreciate your feedback and comments so that we may continue to improve ourselves and the health of our patients.

## 2018-04-28 NOTE — Progress Notes (Signed)
Compounded cream Rx faxed to [facility].

## 2018-05-01 ENCOUNTER — Other Ambulatory Visit: Payer: Self-pay | Admitting: Cardiology

## 2018-05-04 DIAGNOSIS — M4120 Other idiopathic scoliosis, site unspecified: Secondary | ICD-10-CM | POA: Diagnosis not present

## 2018-05-04 DIAGNOSIS — M47816 Spondylosis without myelopathy or radiculopathy, lumbar region: Secondary | ICD-10-CM | POA: Diagnosis not present

## 2018-05-04 DIAGNOSIS — M545 Low back pain: Secondary | ICD-10-CM | POA: Diagnosis not present

## 2018-05-06 DIAGNOSIS — D1801 Hemangioma of skin and subcutaneous tissue: Secondary | ICD-10-CM | POA: Diagnosis not present

## 2018-05-06 DIAGNOSIS — D485 Neoplasm of uncertain behavior of skin: Secondary | ICD-10-CM | POA: Diagnosis not present

## 2018-05-06 DIAGNOSIS — L570 Actinic keratosis: Secondary | ICD-10-CM | POA: Diagnosis not present

## 2018-05-06 DIAGNOSIS — D225 Melanocytic nevi of trunk: Secondary | ICD-10-CM | POA: Diagnosis not present

## 2018-05-12 ENCOUNTER — Ambulatory Visit (INDEPENDENT_AMBULATORY_CARE_PROVIDER_SITE_OTHER): Payer: Medicare Other

## 2018-05-12 DIAGNOSIS — Z23 Encounter for immunization: Secondary | ICD-10-CM

## 2018-05-17 ENCOUNTER — Encounter (HOSPITAL_BASED_OUTPATIENT_CLINIC_OR_DEPARTMENT_OTHER): Payer: Self-pay

## 2018-05-17 ENCOUNTER — Emergency Department (HOSPITAL_BASED_OUTPATIENT_CLINIC_OR_DEPARTMENT_OTHER)
Admission: EM | Admit: 2018-05-17 | Discharge: 2018-05-17 | Disposition: A | Discharge status: Home / Self Care | Payer: Medicare Other | Attending: Emergency Medicine | Admitting: Emergency Medicine

## 2018-05-17 ENCOUNTER — Emergency Department (HOSPITAL_BASED_OUTPATIENT_CLINIC_OR_DEPARTMENT_OTHER): Payer: Medicare Other

## 2018-05-17 ENCOUNTER — Other Ambulatory Visit: Payer: Self-pay

## 2018-05-17 ENCOUNTER — Telehealth: Payer: Self-pay | Admitting: *Deleted

## 2018-05-17 DIAGNOSIS — Y92018 Other place in single-family (private) house as the place of occurrence of the external cause: Secondary | ICD-10-CM | POA: Insufficient documentation

## 2018-05-17 DIAGNOSIS — Y9389 Activity, other specified: Secondary | ICD-10-CM | POA: Diagnosis not present

## 2018-05-17 DIAGNOSIS — R0789 Other chest pain: Secondary | ICD-10-CM | POA: Diagnosis not present

## 2018-05-17 DIAGNOSIS — Z79899 Other long term (current) drug therapy: Secondary | ICD-10-CM | POA: Insufficient documentation

## 2018-05-17 DIAGNOSIS — S299XXA Unspecified injury of thorax, initial encounter: Secondary | ICD-10-CM | POA: Insufficient documentation

## 2018-05-17 DIAGNOSIS — S6992XA Unspecified injury of left wrist, hand and finger(s), initial encounter: Secondary | ICD-10-CM | POA: Diagnosis not present

## 2018-05-17 DIAGNOSIS — I10 Essential (primary) hypertension: Secondary | ICD-10-CM | POA: Diagnosis not present

## 2018-05-17 DIAGNOSIS — Z7901 Long term (current) use of anticoagulants: Secondary | ICD-10-CM | POA: Diagnosis not present

## 2018-05-17 DIAGNOSIS — Y999 Unspecified external cause status: Secondary | ICD-10-CM | POA: Insufficient documentation

## 2018-05-17 DIAGNOSIS — W01198A Fall on same level from slipping, tripping and stumbling with subsequent striking against other object, initial encounter: Secondary | ICD-10-CM | POA: Diagnosis not present

## 2018-05-17 DIAGNOSIS — Z471 Aftercare following joint replacement surgery: Secondary | ICD-10-CM | POA: Diagnosis not present

## 2018-05-17 DIAGNOSIS — S0990XA Unspecified injury of head, initial encounter: Secondary | ICD-10-CM | POA: Diagnosis not present

## 2018-05-17 DIAGNOSIS — I4891 Unspecified atrial fibrillation: Secondary | ICD-10-CM | POA: Diagnosis not present

## 2018-05-17 DIAGNOSIS — R0781 Pleurodynia: Secondary | ICD-10-CM

## 2018-05-17 DIAGNOSIS — R4020 Unspecified coma: Secondary | ICD-10-CM | POA: Diagnosis not present

## 2018-05-17 DIAGNOSIS — Z96651 Presence of right artificial knee joint: Secondary | ICD-10-CM | POA: Diagnosis not present

## 2018-05-17 DIAGNOSIS — W19XXXA Unspecified fall, initial encounter: Secondary | ICD-10-CM

## 2018-05-17 MED ORDER — HYDROCODONE-ACETAMINOPHEN 5-325 MG PO TABS: 1.0000 | ORAL_TABLET | Freq: Three times a day (TID) | ORAL | 0 refills | Status: DC

## 2018-05-17 MED ORDER — ACETAMINOPHEN 325 MG PO TABS
650.0000 mg | ORAL_TABLET | Freq: Once | ORAL | Status: AC
  Administered 2018-05-17: 650 mg via ORAL
  Filled 2018-05-17: qty 2

## 2018-05-17 NOTE — Telephone Encounter (Signed)
Copied from [place] [id number]. Topic: General - Other >> May 17, 2018  2:40 PM Mcneil, Ja-Kwan wrote: Reason for CRM: Pt states she had a fall today and she needs approval to go and have an x-ray done. Pt requests a call back. Cb# [phone]

## 2018-05-17 NOTE — ED Notes (Signed)
RT note-Incentive spirometer instructed. Patient achieved 1569ml.

## 2018-05-17 NOTE — Telephone Encounter (Signed)
I see that pt went to the ED today. I would not have been able to authorize x-ray w/out o/v anyway.

## 2018-05-17 NOTE — ED Provider Notes (Signed)
[facility] Provider Note   CSN: [id number] Arrival date & time: 05/17/18  1541     History   Chief Complaint Chief Complaint  Patient presents with  . Fall    HPI Cassie White is a 77 y.o. female.  HPI  Patient is a 77 year old female with a history of hyperlipidemia, hypertension, mitral regurg, osteoporosis, A. Fib (on Eliquis), who presents the emergency department today for evaluation fell around 12 PM today.  Patient states she was walking on carpet when she tripped over her feet and fell forward landing on her right side.  States she injured her bilateral hands, right knee, right lower chest wall and also hit her face/head on the ground.  She did not lose consciousness.  She denies headache, numbness, weakness, vision changes, dizziness or lightheadedness.  Denies chest pain, shortness of breath or palpitations prior to the fall.  Pain to her right lower ribs is the most severe.  Pain exacerbated with inspiration.  Improves when applying pressure.  Past Medical History:  Diagnosis Date  . Abnormal mammogram of left breast    Likely benign microcalcifications--repeat L diag mammo 03/24/2017.  COMPLEX SCLEROSING LESION WITH CALCIFICATIONS --no sign of malignancy.  Excision recommended--pt has been referred to Dr. Donne Hazel, who recommended f/u mammo and this was normal 12/2017--consider rpt 1 yr per rad.  . Arthritis    DJD, back and both hips  . Chronic low back pain without sciatica    Lumbar spondylosis + scoliosis.  Summer 2017 Dr. Nelva Bush did ESI and pt got no relief.  Pt then saw Dr. Maia Petties [facility]: felt she had facet mediated pain; plan for B L345 MBB (??).  Marland Kitchen Diverticulosis    on colonoscopy 2004  . GERD (gastroesophageal reflux disease)   . Hx of cardiovascular stress test    Lexiscan Myoview (2/14):  Low risk, small ant defect likely breast attenuation, no ischemia, EF 69% (no change from 2010).    . Hyperlipidemia     . Hypertension    per pt  . Idiopathic scoliosis of thoracolumbar region   . Lumbar back pain   . Mitral regurgitation    Echo (2/14):  EF 60-65%, Gr 1 DD, mild AI, mild bileaflet MVP, mod post directed MR, mild LAE, PASP 35.  Marland Kitchen MVP (mitral valve prolapse)   . 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  Responding to gabapentin (Dr. Brett Fairy).  . Osteoporosis, senile    stable at the left hip 2014-2015 (T-score -2.7 when she stopped bisphosphonate).  Took fosamax for 10 yrs.  Repeat DEXA 60mo later (09/2016) T-score -.3.1 (different machine, though).  Marland Kitchen PAF (paroxysmal atrial fibrillation) (HCC)    Dr. Martinique: Flecainide, metopr, apxiaban.  . Peripheral neuropathy 2008   Idiopathic vs familial (motor>sensory) vs lumbar disc dz: gabapentin helpful  . Shoulder pain left   RC surg 09/2014    Patient Active Problem List   Diagnosis Date Noted  . Pain in both feet 12/14/2017  . Neuropathy 12/14/2017  . Benign essential tremor 12/14/2017  . Paroxysmal atrial fibrillation ([place]) 12/14/2017  . Chronic low back pain without sciatica 12/14/2017  . 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  . Snoring 05/19/2017  . 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 Female bladder prolapse 12/06/2015  . Paroxysmal atrial fibrillation with rapid ventricular response ([place]) 09/28/2015  . Maxillary sinusitis, acute 08/27/2015  . Mitral insufficiency 05/12/2014  . HTN (hypertension) 04/28/2014  . Lactic  acidosis 09/07/2013  . Mitral valve prolapse 06/14/2013  . Chest pain, exertional 09/13/2012  . Pernicious anemia 09/07/2012  . DIVERTICULOSIS, COLON 09/06/2009  . Osteoporosis 09/06/2009  . Hyperlipidemia 09/04/2009  . Peripheral sensory neuropathy 09/04/2009  . LOW BACK PAIN, CHRONIC 04/08/2007    Past Surgical History:  Procedure Laterality Date  . BREAST BIOPSY  04/10/2017   Left breast core needle biopsy of calcifications.  COMPLEX SCLEROSING LESION  WITH CALCIFICATIONS --no sign of malignancy.  Excision recommended--pt has been referred to Dr. Donne Hazel, who recommended rpt mammo and this was NORMAL 12/2017.  Marland Kitchen BREAST EXCISIONAL BIOPSY Right 2010   Benign  . BREAST SURGERY  Nov 2010   Benign biopsy, right  . CHOLECYSTECTOMY  03/2010   Dr.Gross  . COLONOSCOPY  10/03/2002   No polyps.  Repeat 10 yrs recommended but pt declines.  Pt declined cologuard 08/2016 but changed her mind and this test was NEG on 03/23/17.  . COMBINED HYSTERECTOMY VAGINAL / OOPHORECTOMY / A&P REPAIR  2426   uncertain if ovaries were removed or not  . CYSTOCELE REPAIR    . Lumpectomy  06/2009   "Fatty Necrosis"  . PFT's  2014   NORMAL  . ROTATOR CUFF REPAIR Left 10/02/14   Dr. Veverly Fells  . TONSILLECTOMY    . TOTAL KNEE ARTHROPLASTY  09/2009   Right ;Dr Alvan Dame  . TOTAL KNEE ARTHROPLASTY Left 03/10/2016   Procedure: TOTAL KNEE ARTHROPLASTY;  Surgeon: Paralee Cancel, MD;  Location: [facility];  Service: Orthopedics;  Laterality: Left;  . TRANSTHORACIC ECHOCARDIOGRAM  09/2012; 09/27/15   2014: EF 60-65%, Gr 1 DD, mild AI, mild bileaflet MVP, mod post directed MR, mild LAE, PASP 35.  Repeat 2017: EF 55-60%, mild AR, mild MVP and mild MV regurg.  Marland Kitchen VAGINAL HYSTERECTOMY     For Uterine Deviation      OB History   None      Home Medications    Prior to Admission medications   Medication Sig Start Date End Date Taking? Authorizing Provider  Biotin 5000 MCG CAPS Take 5,000 mcg by mouth daily.    [provider]  Calcium Carbonate (CALCIUM 600 PO) Take 600 mg by mouth 2 (two) times daily.     [provider]  cholecalciferol (VITAMIN D) 1000 UNITS tablet Take 5,000 Units by mouth daily.     [provider]  cyanocobalamin (,VITAMIN B-12,) 1000 MCG/ML injection INJECT 1 ML (1,000 MCG TOTAL) INTO THE MUSCLE EVERY 30 (THIRTY) DAYS. 12/02/17   [name], Adrian Blackwater, MD  ELIQUIS 5 MG TABS tablet TAKE 1 TABLET BY MOUTH TWICE A DAY 02/01/18   Martinique, Peter M, MD    flecainide (TAMBOCOR) 50 MG tablet Take 1 tablet (50 mg total) by mouth 2 (two) times daily. 09/14/17   Martinique, Peter M, MD  gabapentin (NEURONTIN) 300 MG capsule TAKE 1 CAPSULE BY MOUTH AT LUNCH, 1 AT [place], AND 2 AT BEDTIME 10/05/17   Dohmeier, Asencion Partridge, MD  HYDROcodone-acetaminophen (NORCO/VICODIN) 5-325 MG tablet Take 1 tablet by mouth every 8 (eight) hours. 05/17/18   Alyze Lauf S, PA-C  metoprolol tartrate (LOPRESSOR) 25 MG tablet TAKE 1 TABLET BY MOUTH TWICE A DAY 02/16/18   [name], Adrian Blackwater, MD  nitroGLYCERIN (NITROSTAT) 0.4 MG SL tablet Place 1 tablet (0.4 mg total) under the tongue every 5 (five) minutes as needed for chest pain. 09/18/17   Martinique, Peter M, MD  NONFORMULARY OR COMPOUNDED ITEM     [provider]  Omega-3 Fatty  Acids (FISH OIL) 1200 MG CAPS Take 1,000 mg by mouth 2 (two) times daily.     [provider]  pravastatin (PRAVACHOL) 40 MG tablet TAKE 1 TABLET BY MOUTH EVERY DAY 05/03/18   Martinique, Peter M, MD    Family History Family History  Problem Relation Age of Onset  . Heart attack Father        in 23s  . Stroke Father 29  . Ovarian cancer Mother   . Heart attack Son 55       Smoker  . Coronary artery disease Brother   . COPD Brother   . Diabetes Neg Hx     Social History Social History   Tobacco Use  . Smoking status: Never Smoker  . Smokeless tobacco: Never Used  Substance Use Topics  . Alcohol use: No    Alcohol/week: 0.0 standard drinks  . Drug use: No     Allergies   Sulfa antibiotics and Tizanidine   Review of Systems Review of Systems  Constitutional: Negative for chills and fever.  HENT:       Right lower lip pain  Eyes: Negative for visual disturbance.  Respiratory: Negative for shortness of breath.   Cardiovascular:       Right lower chest wall pain  Gastrointestinal: Negative for abdominal pain, nausea and vomiting.  Genitourinary: Negative for flank pain.  Musculoskeletal: Negative for back pain and neck  pain.       Right knee pain, bilat hand pain  Skin: Positive for wound. Negative for rash.  Neurological: Negative for dizziness, weakness, light-headedness, numbness and headaches.       No loc, +head trauma    Physical Exam Updated Vital Signs BP (!) 142/62 (BP Location: Left Arm)   Pulse 63   Temp 98 F (36.7 C) (Oral)   Resp 18   Ht 5\' 1"  (1.549 m)   Wt 66.2 kg   SpO2 98%   BMI 27.59 kg/m   Physical Exam  Constitutional: She is oriented to person, place, and time. She appears well-developed and well-nourished. No distress.  Well appearing. Appears younger than stated age  HENT:  Head: Normocephalic.  Mouth/Throat: Oropharynx is clear and moist.  No swelling or signs of trauma to lower lip. No dental fractures. No battle signs, no raccoons eyes, no rhinorrhea. No tenderness to palpation of the skull or face. No deformity or crepitus noted.  Eyes: Pupils are equal, round, and reactive to light. Conjunctivae and EOM are normal.  Neck: Normal range of motion. Neck supple.  Cardiovascular: Normal rate, regular rhythm and normal heart sounds.  No murmur heard. Pulmonary/Chest: Effort normal and breath sounds normal. No respiratory distress.  Right lower chest wall TTP.  Abdominal: Soft. Bowel sounds are normal. There is no tenderness.  Musculoskeletal: She exhibits no edema.  Bruising to the left thenar eminence with mild overlying TTP. Abrasion to right 5th metacarpal with no overlying TTP or deformity. Right patellar TTP. No medial or lateral joint line TTP to the knee.   Neurological: She is alert and oriented to person, place, and time.  Mental Status:  Alert, thought content appropriate, able to give a coherent history. Speech fluent without evidence of aphasia. Able to follow 2 step commands without difficulty.  Cranial Nerves:  II:  pupils equal, round, reactive to light III,IV, VI: ptosis not present, extra-ocular motions intact bilaterally  V,VII: smile symmetric,  facial light touch sensation equal VIII: hearing grossly normal to voice  X: uvula elevates  symmetrically  XI: bilateral shoulder shrug symmetric and strong XII: midline tongue extension without fassiculations Motor:  Normal tone. 5/5 strength of BUE and BLE major muscle groups including strong and equal grip strength and dorsiflexion/plantar flexion Sensory: light touch normal in all extremities.  Skin: Skin is warm and dry. Capillary refill takes less than 2 seconds.  Psychiatric: She has a normal mood and affect.  Nursing note and vitals reviewed.    ED Treatments / Results  Labs (all labs ordered are listed, but only abnormal results are displayed) Labs Reviewed - No data to display  EKG None  Radiology Dg Ribs Unilateral W/chest Right  Result Date: 05/17/2018 CLINICAL DATA:  78 year old female with a history of right rib injury EXAM: RIGHT RIBS AND CHEST - 3+ VIEW COMPARISON:  None. FINDINGS: No fracture or other bone lesions are seen involving the ribs. There is no evidence of pneumothorax or pleural effusion. Both lungs are clear. Heart size and mediastinal contours are within normal limits. IMPRESSION: No displaced rib fracture identified Electronically Signed   By: Corrie Mckusick D.O.   On: 05/17/2018 18:21   Ct Head Wo Contrast  Result Date: 05/17/2018 CLINICAL DATA:  77 year old female post fall hitting front of head. Denies loss of consciousness. Initial encounter. EXAM: CT HEAD WITHOUT CONTRAST TECHNIQUE: Contiguous axial images were obtained from the base of the skull through the vertex without intravenous contrast. COMPARISON:  09/20/2010 MR. 09/19/2010 CT. FINDINGS: Brain: No intracranial hemorrhage or CT evidence of large acute infarct. Mild chronic microvascular changes. Mild global atrophy. No intracranial mass lesion noted on this unenhanced exam. Vascular: No hyperdense vessel.  Vascular calcifications. Skull: No skull fracture. Sinuses/Orbits: No acute orbital  abnormality. Visualized paranasal sinuses are clear. Other: Mastoid air cells and middle ear cavities are clear. IMPRESSION: 1. No acute intracranial abnormality noted. 2. Mild chronic microvascular changes. 3. Mild global atrophy. Electronically Signed   By: Genia Del M.D.   On: 05/17/2018 18:28   Dg Knee Complete 4 Views Right  Result Date: 05/17/2018 CLINICAL DATA:  [place] and landed on right knee, now with pain below the patella EXAM: RIGHT KNEE - COMPLETE 4+ VIEW COMPARISON:  None. FINDINGS: Status post right knee replacement with intact hardware. No definite acute displaced fracture or malalignment is seen. Lucent defects in the patella, likely due to prior intervention. No significant knee effusion. IMPRESSION: Status post right knee replacement. No definite acute osseous abnormality. Electronically Signed   By: Donavan Foil M.D.   On: 05/17/2018 18:26   Dg Hand Complete Left  Result Date: 05/17/2018 CLINICAL DATA:  Fall with injury to the left hand, pain on the palm side of hand with bruising near the base of the thumb EXAM: LEFT HAND - COMPLETE 3+ VIEW COMPARISON:  None. FINDINGS: No acute displaced fracture is visualized. There may be slight radial subluxation at the base of the first metacarpal. Mild joint space narrowing at the DIP joints. Cartilaginous calcifications. No radiopaque foreign body in the soft tissues. IMPRESSION: 1. No acute displaced fracture 2. There may be slight radial subluxation of the base of the first metacarpal of uncertain chronicity 3. Mild digital arthritis Electronically Signed   By: Donavan Foil M.D.   On: 05/17/2018 18:29    Procedures Procedures (including critical care time)  Medications Ordered in ED Medications  acetaminophen (TYLENOL) tablet 650 mg (650 mg Oral Given 05/17/18 1647)     Initial Impression / Assessment and Plan / ED Course  I have reviewed the  triage vital signs and the nursing notes.  Pertinent labs & imaging results that  were available during my care of the patient were reviewed by me and considered in my medical decision making (see chart for details).   Patient seen in conjunction with Dr. Maryan Rued who agrees with the work-up and plan for discharge with close outpatient follow-up.  Reviewed patient in [place] narcotic database and there are no red flags.  Final Clinical Impressions(s) / ED Diagnoses   Final diagnoses:  Fall, initial encounter  Rib pain on right side   77 year old female presenting to the ED today after mechanical fall this afternoon.  Had head trauma but no LOC.  On Eliquis.  CT head negative for acute intracranial abnormality.  X-ray of the right knee without acute osseous abnormality.  X-ray of the left hand with possible subluxation of the base of the first metacarpal of uncertain chronicity, on reevaluation patient has no point tenderness on this area and have low suspicion for fracture or subluxation.  She has full range of motion, no pain and is neurovascularly intact.  Chest x-ray with no pneumonia or pneumothorax.  No obvious rib fractures on x-ray.  Given patient's pain to the right rib area, will give incentive spirometer and precautions in case there is a small nondisplaced fracture left and identified by x-ray.  Will give pain medications.  Patient states she is tolerated hydrocodone in the past.  Advised patient and family at bedside to have the patient follow-up with PCP next week for reevaluation.  Strict return precautions discussed.  Patient and family voiced understanding the plan and reasons to return to ED.  All questions answered.  ED Discharge Orders         Ordered    HYDROcodone-acetaminophen (NORCO/VICODIN) 5-325 MG tablet  Every 8 hours     05/17/18 1900           Rodney Booze, PA-C 05/17/18 1901    Blanchie Dessert, MD 05/17/18 2356

## 2018-05-17 NOTE — Telephone Encounter (Signed)
noted 

## 2018-05-17 NOTE — ED Triage Notes (Addendum)
Pt states she tripped/fell ~12p-pain to right rib area, abrasion to right hand-to triage in w/c-NAD

## 2018-05-17 NOTE — ED Notes (Signed)
Delay in EKG due to pt in xray 

## 2018-05-17 NOTE — Discharge Instructions (Addendum)
You were given an incentive spirometer.  Please use as directed on your discharge paperwork.  You may take 500 mg's of Tylenol every 6 hours.  If you are having breakthrough pain you may take 1 Norco every 8 hours.  Norco may cause constipation, if you experience constipation please take a stool softener such as MiraLAX.  Do not drive, work or drink alcohol while taking Norco as it can make you very drowsy.  Please follow-up with your regular doctor in 1 week for reevaluation.  Return to the emergency department for any new or worsening symptoms in the meantime including uncontrolled pain, chest pain, difficulty breathing, cough or fevers.

## 2018-05-27 ENCOUNTER — Ambulatory Visit: Payer: Medicare Other | Admitting: Family Medicine

## 2018-05-27 NOTE — Progress Notes (Deleted)
Cardiology Office Note    Date:  05/27/2018   ID:  Cassie White, DOB May 04, 1941, MRN [id number]  PCP:  Tammi Sou, MD  Cardiologist:  Dr. Martinique  No chief complaint on file.   History of Present Illness:  Cassie White is a 77 y.o. female with PMH of MVP, MR, HLD, GERD and PAF. She had a Myoview in 2010 with anterior breast attenuation, otherwise normal. Myoview obtained in February 2014 was low risk, small anterior defect likely breast attenuation, however no ischemia, EF 69%. Echocardiogram obtained in February 2014 showed EF 17-61%, grade 1 diastolic dysfunction, moderate posterior directed MR, PASP 35 mmHg. Last echocardiogram obtained on 09/27/2015 showed EF 55-60%, mild AR, mild MR, PA peak pressure 36 mmHg. Patient was noted to be in atrial fibrillation during the study. She was placed on eliquis and flecainide and subsequently went back to sinus rhythm. She underwent Myoview on 10/18/2015 to rule out pro arrhythmia and the ischemic heart disease, there was moderate sized and intensity partially reversible anterior and apical defect, EF was 64%, this is likely representing shifting breast artifact, overall it was considered intermediate risk study. The study was reviewed by Dr. Martinique, no further testing was felt to be needed, the image was consider low risk.  She was seen on 11/21/2016 for preoperative clearance, she was doing well at the time, she was cleared to proceed with back surgery.   She did go to the hospital on 02/14/2017 with a large bruise on the medial aspect of the left thigh. This occurred in the absence of trauma. She was on eliquis, it was suspected that she may have had a ruptured varicose vein. This was managed conservatively.  She was seen in the ED on 10/14 with a mechanical fall. CT of the head was negative. ? Rib fracture.  On follow up today she is doing very well from a cardiac standpoint. She really has very infrequent palpitations. No dyspnea or chest  pain. She never did have spinal surgery- was told nothing could be done. She did receive injection without improvement. She wants to consider a second opinion.   Past Medical History:  Diagnosis Date  . Abnormal mammogram of left breast    Likely benign microcalcifications--repeat L diag mammo 03/24/2017.  COMPLEX SCLEROSING LESION WITH CALCIFICATIONS --no sign of malignancy.  Excision recommended--pt has been referred to Dr. Donne Hazel, who recommended f/u mammo and this was normal 12/2017--consider rpt 1 yr per rad.  . Arthritis    DJD, back and both hips  . Chronic low back pain without sciatica    Lumbar spondylosis + scoliosis.  Summer 2017 Dr. Nelva Bush did ESI and pt got no relief.  Pt then saw Dr. Maia Petties [facility]: felt she had facet mediated pain; plan for B L345 MBB (??).  Marland Kitchen Diverticulosis    on colonoscopy 2004  . GERD (gastroesophageal reflux disease)   . Hx of cardiovascular stress test    Lexiscan Myoview (2/14):  Low risk, small ant defect likely breast attenuation, no ischemia, EF 69% (no change from 2010).    . Hyperlipidemia   . Hypertension    per pt  . Idiopathic scoliosis of thoracolumbar region   . Lumbar back pain   . Mitral regurgitation    Echo (2/14):  EF 60-65%, Gr 1 DD, mild AI, mild bileaflet MVP, mod post directed MR, mild LAE, PASP 35.  Marland Kitchen MVP (mitral valve prolapse)   . 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  Responding to gabapentin (Dr. Brett Fairy).  . Osteoporosis, senile    stable at the left hip 2014-2015 (T-score -2.7 when she stopped bisphosphonate).  Took fosamax for 10 yrs.  Repeat DEXA 60mo later (09/2016) T-score -.3.1 (different machine, though).  Marland Kitchen PAF (paroxysmal atrial fibrillation) (HCC)    Dr. Martinique: Flecainide, metopr, apxiaban.  . Peripheral neuropathy 2008   Idiopathic vs familial (motor>sensory) vs lumbar disc dz: gabapentin helpful  . Shoulder pain left   RC surg 09/2014     Past Surgical History:  Procedure Laterality Date  . BREAST BIOPSY  04/10/2017   Left breast core needle biopsy of calcifications.  COMPLEX SCLEROSING LESION WITH CALCIFICATIONS --no sign of malignancy.  Excision recommended--pt has been referred to Dr. Donne Hazel, who recommended rpt mammo and this was NORMAL 12/2017.  Marland Kitchen BREAST EXCISIONAL BIOPSY Right 2010   Benign  . BREAST SURGERY  Nov 2010   Benign biopsy, right  . CHOLECYSTECTOMY  03/2010   Dr.Gross  . COLONOSCOPY  10/03/2002   No polyps.  Repeat 10 yrs recommended but pt declines.  Pt declined cologuard 08/2016 but changed her mind and this test was NEG on 03/23/17.  . COMBINED HYSTERECTOMY VAGINAL / OOPHORECTOMY / A&P REPAIR  5956   uncertain if ovaries were removed or not  . CYSTOCELE REPAIR    . Lumpectomy  06/2009   "Fatty Necrosis"  . PFT's  2014   NORMAL  . ROTATOR CUFF REPAIR Left 10/02/14   Dr. Veverly Fells  . TONSILLECTOMY    . TOTAL KNEE ARTHROPLASTY  09/2009   Right ;Dr Alvan Dame  . TOTAL KNEE ARTHROPLASTY Left 03/10/2016   Procedure: TOTAL KNEE ARTHROPLASTY;  Surgeon: Paralee Cancel, MD;  Location: [facility];  Service: Orthopedics;  Laterality: Left;  . TRANSTHORACIC ECHOCARDIOGRAM  09/2012; 09/27/15   2014: EF 60-65%, Gr 1 DD, mild AI, mild bileaflet MVP, mod post directed MR, mild LAE, PASP 35.  Repeat 2017: EF 55-60%, mild AR, mild MVP and mild MV regurg.  Marland Kitchen VAGINAL HYSTERECTOMY     For Uterine Deviation     Current Medications: Outpatient Medications Prior to Visit  Medication Sig Dispense Refill  . Biotin 5000 MCG CAPS Take 5,000 mcg by mouth daily.    . Calcium Carbonate (CALCIUM 600 PO) Take 600 mg by mouth 2 (two) times daily.     . cholecalciferol (VITAMIN D) 1000 UNITS tablet Take 5,000 Units by mouth daily.     . cyanocobalamin (,VITAMIN B-12,) 1000 MCG/ML injection INJECT 1 ML (1,000 MCG TOTAL) INTO THE MUSCLE EVERY 30 (THIRTY) DAYS. 10 mL 1  . ELIQUIS 5 MG TABS tablet TAKE 1 TABLET BY MOUTH TWICE A DAY 60 tablet 5   . flecainide (TAMBOCOR) 50 MG tablet Take 1 tablet (50 mg total) by mouth 2 (two) times daily. 180 tablet 2  . gabapentin (NEURONTIN) 300 MG capsule TAKE 1 CAPSULE BY MOUTH AT LUNCH, 1 AT DINNER, AND 2 AT BEDTIME 360 capsule 2  . HYDROcodone-acetaminophen (NORCO/VICODIN) 5-325 MG tablet Take 1 tablet by mouth every 8 (eight) hours. 6 tablet 0  . metoprolol tartrate (LOPRESSOR) 25 MG tablet TAKE 1 TABLET BY MOUTH TWICE A DAY 180 tablet 1  . nitroGLYCERIN (NITROSTAT) 0.4 MG SL tablet Place 1 tablet (0.4 mg total) under the tongue every 5 (five) minutes as needed for chest pain. 25 tablet 5  . NONFORMULARY OR COMPOUNDED ITEM     . Omega-3 Fatty Acids (FISH OIL) 1200 MG CAPS  Take 1,000 mg by mouth 2 (two) times daily.     . pravastatin (PRAVACHOL) 40 MG tablet TAKE 1 TABLET BY MOUTH EVERY DAY 90 tablet 1   No facility-administered medications prior to visit.      Allergies:   Sulfa antibiotics and Tizanidine   Social History   Socioeconomic History  . Marital status: Widowed    Spouse name: Not on file  . Number of children: 3  . Years of education: HS grad  . Highest education level: Not on file  Occupational History  . 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 Financial resource strain: Not on file  . Food insecurity:    Worry: Not on file    Inability: Not on file  . Transportation needs:    Medical: Not on file    Non-medical: Not on file  Tobacco Use  . Smoking status: Never Smoker  . Smokeless tobacco: Never Used  Substance and Sexual Activity  . Alcohol use: No    Alcohol/week: 0.0 standard drinks  . Drug use: No  . Sexual activity: Never    Birth control/protection: Post-menopausal  Lifestyle  . Physical activity:    Days per week: Not on file    Minutes per session: Not on file  . Stress: Not on file  Relationships  . Social connections:    Talks on phone: Not on file    Gets together: Not on file    Attends religious service: Not on file     Active member of club or organization: Not on file    Attends meetings of clubs or organizations: Not on file    Relationship status: Not on file  Other Topics Concern  . Not on file  Social History Narrative   Widow, has 3 children, 4 grandchildren.   Orig from [place].   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   Caffiene, 1 cup daily avg.   No tob, no alc, no drugs.   Exercise: walking, limited by bilat hip pain.     Family History:  The patient's family history includes COPD in her brother; Coronary artery disease in her brother; Heart attack in her father; Heart attack (age of onset: 38) in her son; Ovarian cancer in her mother; Stroke (age of onset: 82) in her father.   ROS:   Please see the history of present illness.    ROS All other systems reviewed and are negative.   PHYSICAL EXAM:   VS:  There were no vitals taken for this visit.   GENERAL:  Well appearing WF in NAD HEENT:  PERRL, EOMI, sclera are clear. Oropharynx is clear. NECK:  No jugular venous distention, carotid upstroke brisk and symmetric, no bruits, no thyromegaly or adenopathy LUNGS:  Clear to auscultation bilaterally CHEST:  Unremarkable HEART:  RRR,  PMI not displaced or sustained,S1 and S2 within normal limits, no S3, no S4: no clicks, no rubs, no murmurs ABD:  Soft, nontender. BS +, no masses or bruits. No hepatomegaly, no splenomegaly EXT:  2 + pulses throughout, no edema, no cyanosis no clubbing SKIN:  Warm and dry.  No rashes NEURO:  Alert and oriented x 3. Cranial nerves II through XII intact. PSYCH:  Cognitively intact    Wt Readings from Last 3 Encounters:  05/17/18 146 lb (66.2 kg)  04/28/18 154 lb (69.9 kg)  03/26/18 150 lb (68 kg)      Studies/Labs Reviewed:   EKG:  EKG is not ordered today.  Recent Labs: 09/04/2017: ALT 14; Hemoglobin 13.9; Platelets 174.0 03/26/2018: BUN 15; Creatinine, Ser 0.82; Potassium 4.5; Sodium 139   Lipid Panel     Component Value Date/Time   CHOL 186 03/26/2018 0946   CHOL 211 (H) 04/28/2014 1026   TRIG 133.0 03/26/2018 0946   TRIG 95 04/28/2014 1026   HDL 75.90 03/26/2018 0946   HDL 101 04/28/2014 1026   CHOLHDL 2 03/26/2018 0946   VLDL 26.6 03/26/2018 0946   LDLCALC 83 03/26/2018 0946   LDLCALC 91 04/28/2014 1026   LDLDIRECT 87.5 09/07/2012 1022    Additional studies/ records that were reviewed today include:   Echo 09/27/2015 LV EF: 55% -   60%  Study Conclusions  - Left ventricle: The cavity size was normal. There was mild   concentric hypertrophy. Systolic function was normal. The   estimated ejection fraction was in the range of 55% to 60%. - Aortic valve: There was mild regurgitation. - Mitral valve: Mild anterior leafelt prolapse. There was mild   regurgitation. - Atrial septum: No defect or patent foramen ovale was identified. - Pulmonary arteries: PA peak pressure: 36 mm Hg (S).   Myoview 10/18/2015 Study Highlights    The left ventricular ejection fraction is moderately decreased (30-44%).  Nuclear stress EF: 64%.  There was no ST segment deviation noted during stress.  No T wave inversion was noted during stress.  Defect 1: There is a medium defect of moderate severity.  This is an intermediate risk study.  Findings consistent with prior myocardial infarction with peri-infarct ischemia.   Moderate size and intensity partially reversible anterior and apical wall defect (SDS 4). LVEF 64% with normal wall motion. This likely represents shifting breast artifact given normal wall motion and LV function. Clinical correlation is advised. This is an intermediate risk study.      ASSESSMENT:    No diagnosis found.   PLAN:  In order of problems listed above:  1. PAF: Currently on flecainide, metoprolol and eliquis. Symptoms are well controlled with infrequent palpitations. No further bleeding issues. Will continue current therapy.  2. Hyperlipidemia: Last  fasting lipid panel and LFTs in June 2018, cholesterol 185, HDL 85, LDL 77, triglyceride 111. Well-controlled  3. Moderate mitral regurgitation with MV prolapse: Continue to monitor. She is asymptomatic.     Medication Adjustments/Labs and Tests Ordered: Current medicines are reviewed at length with the patient today.  Concerns regarding medicines are outlined above.  Medication changes, Labs and Tests ordered today are listed in the Patient Instructions below. There are no Patient Instructions on file for this visit.   Signed, Peter Martinique, MD  05/27/2018 7:50 AM    [address] [place], [place],   [address] Phone: [phone]; Fax: [phone]

## 2018-05-28 ENCOUNTER — Ambulatory Visit: Payer: Medicare Other | Admitting: Cardiology

## 2018-05-31 ENCOUNTER — Telehealth: Payer: Self-pay | Admitting: Cardiology

## 2018-05-31 NOTE — Telephone Encounter (Signed)
Received call from patient she stated she has been having palpitations every morning for the past 1 month.No chest pain or sob.Appointment scheduled with Jory Sims DNP 06/02/18 at 2:00 pm.

## 2018-05-31 NOTE — Telephone Encounter (Signed)
New Message         Patient c/o Palpitations:  High priority if patient c/o lightheadedness, shortness of breath, or chest pain  1) How long have you had palpitations/irregular HR/ Afib? Are you having the symptoms now? For a little while  2) Are you currently experiencing lightheadedness, SOB or CP? Yes   3) Do you have a history of afib (atrial fibrillation) or irregular heart rhythm? Yes  4) Have you checked your BP or HR? (document readings if available): No   5) Are you experiencing any other symptoms? No

## 2018-06-01 NOTE — Progress Notes (Signed)
Cardiology Office Note   Date:  06/01/2018   ID:  Cassie White, DOB [birth year], MRN [id number]  PCP:  Tammi Sou, MD  Cardiologist: Dr. Martinique   No chief complaint on file.    History of Present Illness: Cassie White is a 77 y.o. female who presents for ongoing assessment and management atrial fibrillation, on Eliquis and flecainide and converted to NSR. Stress test was completed which revealed a moderate sized and intensity partially reversible anterior and apical defect thought to be breast attenuation. This was reviewed by Dr. Martinique who did not feel further testing was warranted. Other history of hyperlipidemia, moderate mitral regurgitation and MV prolapse. She was last seen by Dr. Martinique on 09/18/2017 and was stable from cardiac standpoint.   She comes today feeling her HR racing and irregular. She is tired in the am and a ringing in her ears. She has noticed this more so over the last month. She was seen by PCP in August and was in NSR. She has been medically compliant with Flecainide and metoprolol.     Past Medical History:  Diagnosis Date  . Abnormal mammogram of left breast    Likely benign microcalcifications--repeat L diag mammo 03/24/2017.  COMPLEX SCLEROSING LESION WITH CALCIFICATIONS --no sign of malignancy.  Excision recommended--pt has been referred to Dr. Donne Hazel, who recommended f/u mammo and this was normal 12/2017--consider rpt 1 yr per rad.  . Arthritis    DJD, back and both hips  . Chronic low back pain without sciatica    Lumbar spondylosis + scoliosis.  Summer 2017 Dr. Nelva Bush did ESI and pt got no relief.  Pt then saw Dr. Maia Petties with [facility]: felt she had facet mediated pain; plan for B L345 MBB (??).  Marland Kitchen Diverticulosis    on colonoscopy 2004  . GERD (gastroesophageal reflux disease)   . Hx of cardiovascular stress test    Lexiscan Myoview (2/14):  Low risk, small ant defect likely breast attenuation, no ischemia, EF 69% (no change from  2010).    . Hyperlipidemia   . Hypertension    per pt  . Idiopathic scoliosis of thoracolumbar region   . Lumbar back pain   . Mitral regurgitation    Echo (2/14):  EF 60-65%, Gr 1 DD, mild AI, mild bileaflet MVP, mod post directed MR, mild LAE, PASP 35.  Marland Kitchen MVP (mitral valve prolapse)   . 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  Responding to gabapentin (Dr. Brett Fairy).  . Osteoporosis, senile    stable at the left hip 2014-2015 (T-score -2.7 when she stopped bisphosphonate).  Took fosamax for 10 yrs.  Repeat DEXA 41mo later (09/2016) T-score -.3.1 (different machine, though).  Marland Kitchen PAF (paroxysmal atrial fibrillation) (HCC)    Dr. Martinique: Flecainide, metopr, apxiaban.  . Peripheral neuropathy 2008   Idiopathic vs familial (motor>sensory) vs lumbar disc dz: gabapentin helpful  . Shoulder pain left   RC surg 09/2014    Past Surgical History:  Procedure Laterality Date  . BREAST BIOPSY  04/10/2017   Left breast core needle biopsy of calcifications.  COMPLEX SCLEROSING LESION WITH CALCIFICATIONS --no sign of malignancy.  Excision recommended--pt has been referred to Dr. Donne Hazel, who recommended rpt mammo and this was NORMAL 12/2017.  Marland Kitchen BREAST EXCISIONAL BIOPSY Right 2010   Benign  . BREAST SURGERY  Nov 2010   Benign biopsy, right  . CHOLECYSTECTOMY  03/2010   Dr.Gross  . COLONOSCOPY  10/03/2002  No polyps.  Repeat 10 yrs recommended but pt declines.  Pt declined cologuard 08/2016 but changed her mind and this test was NEG on 03/23/17.  . COMBINED HYSTERECTOMY VAGINAL / OOPHORECTOMY / A&P REPAIR  7035   uncertain if ovaries were removed or not  . CYSTOCELE REPAIR    . Lumpectomy  06/2009   "Fatty Necrosis"  . PFT's  2014   NORMAL  . ROTATOR CUFF REPAIR Left 10/02/14   Dr. Veverly Fells  . TONSILLECTOMY    . TOTAL KNEE ARTHROPLASTY  09/2009   Right ;Dr Alvan Dame  . TOTAL KNEE ARTHROPLASTY Left 03/10/2016   Procedure: TOTAL KNEE ARTHROPLASTY;   Surgeon: Paralee Cancel, MD;  Location: [facility];  Service: Orthopedics;  Laterality: Left;  . TRANSTHORACIC ECHOCARDIOGRAM  09/2012; 09/27/15   2014: EF 60-65%, Gr 1 DD, mild AI, mild bileaflet MVP, mod post directed MR, mild LAE, PASP 35.  Repeat 2017: EF 55-60%, mild AR, mild MVP and mild MV regurg.  Marland Kitchen VAGINAL HYSTERECTOMY     For Uterine Deviation      Current Outpatient Medications  Medication Sig Dispense Refill  . Biotin 5000 MCG CAPS Take 5,000 mcg by mouth daily.    . Calcium Carbonate (CALCIUM 600 PO) Take 600 mg by mouth 2 (two) times daily.     . cholecalciferol (VITAMIN D) 1000 UNITS tablet Take 5,000 Units by mouth daily.     . cyanocobalamin (,VITAMIN B-12,) 1000 MCG/ML injection INJECT 1 ML (1,000 MCG TOTAL) INTO THE MUSCLE EVERY 30 (THIRTY) DAYS. 10 mL 1  . ELIQUIS 5 MG TABS tablet TAKE 1 TABLET BY MOUTH TWICE A DAY 60 tablet 5  . flecainide (TAMBOCOR) 50 MG tablet Take 1 tablet (50 mg total) by mouth 2 (two) times daily. 180 tablet 2  . gabapentin (NEURONTIN) 300 MG capsule TAKE 1 CAPSULE BY MOUTH AT LUNCH, 1 AT DINNER, AND 2 AT BEDTIME 360 capsule 2  . HYDROcodone-acetaminophen (NORCO/VICODIN) 5-325 MG tablet Take 1 tablet by mouth every 8 (eight) hours. 6 tablet 0  . metoprolol tartrate (LOPRESSOR) 25 MG tablet TAKE 1 TABLET BY MOUTH TWICE A DAY 180 tablet 1  . nitroGLYCERIN (NITROSTAT) 0.4 MG SL tablet Place 1 tablet (0.4 mg total) under the tongue every 5 (five) minutes as needed for chest pain. 25 tablet 5  . NONFORMULARY OR COMPOUNDED ITEM     . Omega-3 Fatty Acids (FISH OIL) 1200 MG CAPS Take 1,000 mg by mouth 2 (two) times daily.     . pravastatin (PRAVACHOL) 40 MG tablet TAKE 1 TABLET BY MOUTH EVERY DAY 90 tablet 1   No current facility-administered medications for this visit.     Allergies:   Sulfa antibiotics and Tizanidine    Social History:  The patient  reports that she has never smoked. She has never used smokeless tobacco. She reports that she does not drink  alcohol or use drugs.   Family History:  The patient's family history includes COPD in her brother; Coronary artery disease in her brother; Heart attack in her father; Heart attack (age of onset: 32) in her son; Ovarian cancer in her mother; Stroke (age of onset: 90) in her father.    ROS: All other systems are reviewed and negative. Unless otherwise mentioned in H&P    PHYSICAL EXAM: VS:  There were no vitals taken for this visit. , BMI There is no height or weight on file to calculate BMI. GEN: Well nourished, well developed, in no acute distress HEENT: normal  Neck: no JVD, carotid bruits, or masses Cardiac:IRRR; no murmurs, rubs, or gallops,no edema  Respiratory:  Clear to auscultation bilaterally, normal work of breathing GI: soft, nontender, nondistended, + BS MS: no deformity or atrophy Skin: warm and dry, no rash Neuro:  Strength and sensation are intact Psych: euthymic mood, full affect   EKG:  Atrial flutter, HR of 123 bpm.  Recent Labs: 09/04/2017: ALT 14; Hemoglobin 13.9; Platelets 174.0 03/26/2018: BUN 15; Creatinine, Ser 0.82; Potassium 4.5; Sodium 139    Lipid Panel    Component Value Date/Time   CHOL 186 03/26/2018 0946   CHOL 211 (H) 04/28/2014 1026   TRIG 133.0 03/26/2018 0946   TRIG 95 04/28/2014 1026   HDL 75.90 03/26/2018 0946   HDL 101 04/28/2014 1026   CHOLHDL 2 03/26/2018 0946   VLDL 26.6 03/26/2018 0946   LDLCALC 83 03/26/2018 0946   LDLCALC 91 04/28/2014 1026   LDLDIRECT 87.5 09/07/2012 1022      Wt Readings from Last 3 Encounters:  05/17/18 146 lb (66.2 kg)  04/28/18 154 lb (69.9 kg)  03/26/18 150 lb (68 kg)      Other studies Reviewed: Echocardiogram 2017/10/10 Left ventricle: The cavity size was normal. There was mild   concentric hypertrophy. Systolic function was normal. The   estimated ejection fraction was in the range of 55% to 60%. - Aortic valve: There was mild regurgitation. - Mitral valve: Mild anterior leafelt prolapse.  There was mild   regurgitation. - Atrial septum: No defect or patent foramen ovale was identified. - Pulmonary arteries: PA peak pressure: 36 mm Hg (S).  ASSESSMENT AND PLAN:  1.  PAF: She has been feeling irregular HR for a couple of weeks. I have spoken to Dr. Ellyn Hack who is DOD today about increased dosing of flecainide It is his recommendation that we temporarily increase flecainide dose. This evening take 150 mg X1 (3-50 mg tablets)  Tomorrow am 100 mg in the am and 100 mg in the pm Third day: Take 50 mg and come in for EKG.  Consideration of DCCV if she does not convert. EKG will be reviewed by DOD with recommendations about next steps.   In the interim I will have CBC, BMET drawn to assess for anemia and electrolyte imbalance contributing to aflutter.  She is to avoid caffeine.     Current medicines are reviewed at length with the patient today.    Labs/ tests ordered today include: BMET and CBC  Phill Myron. West Pugh, ANP, AACC   06/01/2018 4:54 PM    [address] [place] [address] Office [phone] Fax [phone]

## 2018-06-02 ENCOUNTER — Ambulatory Visit (INDEPENDENT_AMBULATORY_CARE_PROVIDER_SITE_OTHER): Payer: Medicare Other | Admitting: Adult Health

## 2018-06-02 ENCOUNTER — Encounter: Payer: Self-pay | Admitting: Adult Health

## 2018-06-02 ENCOUNTER — Encounter: Payer: Self-pay | Admitting: *Deleted

## 2018-06-02 VITALS — BP 104/70 | HR 122 | Ht 61.0 in | Wt 152.0 lb

## 2018-06-02 DIAGNOSIS — Z79899 Other long term (current) drug therapy: Secondary | ICD-10-CM

## 2018-06-02 DIAGNOSIS — I480 Paroxysmal atrial fibrillation: Secondary | ICD-10-CM

## 2018-06-02 NOTE — Patient Instructions (Addendum)
Special instructions Take flecainide 3 tab (150mg )tonight, 100mg (2tab) thurs morning, 100mg  (2tabs) thurs evening. Then back to normal-if back to [facility] in for EKG  Follow-Up:  EKG APPT ON FRIDAY You will need a follow up appointment in 1 week.  You may see  DR Martinique, Kathryn Lawrence, DNP, ANP -or- one of the following Advanced Practice Providers on your designated Care Team:    . Almyra Deforest, PA-C (seen before) Fabian Sharp, PA-C  Medication Instructions:  NO CHANGES- Your physician recommends that you continue on your current medications as directed. Please refer to the Current Medication list given to you today.  If you need a refill on your cardiac medications before your next appointment, please call your pharmacy.  Labwork: If you have labs (blood work) drawn today and your tests are completely normal, you will receive your results ONLY by: . MyChart Message (if you have MyChart) -OR- . A paper copy in the mail  At [facility], you and your health needs are our priority.  As part of our continuing mission to provide you with exceptional heart care, we have created designated Provider Care Teams.  These Care Teams include your primary Cardiologist (physician) and Advanced Practice Providers (APPs -  Physician Assistants and Nurse Practitioners) who all work together to provide you with the care you need, when you need it.  Thank you for choosing [facility] at [facility]!!

## 2018-06-03 LAB — BASIC METABOLIC PANEL
BUN/Creatinine Ratio: 18 (ref 12–28)
BUN: 18 mg/dL (ref 8–27)
CO2: 26 mmol/L (ref 20–29)
CREATININE: 0.99 mg/dL (ref 0.57–1.00)
Calcium: 9.5 mg/dL (ref 8.7–10.3)
Chloride: 107 mmol/L — ABNORMAL HIGH (ref 96–106)
GFR calc Af Amer: 64 mL/min/{1.73_m2} (ref >59)
GFR, EST NON AFRICAN AMERICAN: 55 mL/min/{1.73_m2} — AB (ref >59)
Glucose: 72 mg/dL (ref 65–99)
POTASSIUM: 5.1 mmol/L (ref 3.5–5.2)
SODIUM: 145 mmol/L — AB (ref 134–144)

## 2018-06-03 LAB — CBC
HEMATOCRIT: 39.7 % (ref 34.0–46.6)
HEMOGLOBIN: 13.3 g/dL (ref 11.1–15.9)
MCH: 30.9 pg (ref 26.6–33.0)
MCHC: 33.5 g/dL (ref 31.5–35.7)
MCV: 92 fL (ref 79–97)
Platelets: 172 10*3/uL (ref 150–450)
RBC: 4.31 x10E6/uL (ref 3.77–5.28)
RDW: 12.4 % (ref 12.3–15.4)
WBC: 6.1 10*3/uL (ref 3.4–10.8)

## 2018-06-04 ENCOUNTER — Ambulatory Visit: Payer: Medicare Other

## 2018-06-04 VITALS — HR 114 | Ht 61.0 in

## 2018-06-04 DIAGNOSIS — I480 Paroxysmal atrial fibrillation: Secondary | ICD-10-CM

## 2018-06-04 NOTE — Progress Notes (Signed)
Pt is here for f/u ekg for AFIB pt states that she took medication as ordered by Jory Sims [facility]. She states that she does not feel any different since taking adjusted dose. She is back to "regular dosing".  EKG reviewed by DrBerry (DOD) he states that pt is still in AFIB and ok to wait for [facility] to review. Pt left facility without incident. She will call back if she needs anything else before Monday.

## 2018-06-07 ENCOUNTER — Encounter: Payer: Self-pay | Admitting: Adult Health

## 2018-06-07 MED ORDER — FLECAINIDE ACETATE 100 MG PO TABS: 100.0000 mg | ORAL_TABLET | Freq: Two times a day (BID) | ORAL | 3 refills | Status: DC

## 2018-06-07 NOTE — Progress Notes (Signed)
Repeat EKG was reviewed by myself and by Dr. Martinique. She has been on Flecainide since 2017 and done well until recently. It is recommended that she be scheduled for a DCCV, and increase her dose to Flecainide 100 mg BID until cardioversion. Will notify patient of these plans. Can have office appt if she prefers to discuss this.

## 2018-06-07 NOTE — Addendum Note (Signed)
Addended by: Waylan Rocher on: 06/07/2018 09:37 AM   Modules accepted: Orders

## 2018-06-07 NOTE — Progress Notes (Signed)
Pt notified, she will increase flecainide she states that she has appt on Wednesday @3pm . I will discuss this with Jory Sims [facility] and see if we need to schedule a sooner appt she would like me to call her daughter Billey Gosling ([phone]) and ask her when we can make an appt as she is her transportation.  S/W [place] THAT Wednesday IS SOON ENOUGH, MAKE SURE TO START INCREASED DOSE OF FLECAINIDE   Pt informed. Pt verbalized understanding. No further questions . She will be in on Wednesday

## 2018-06-08 ENCOUNTER — Ambulatory Visit: Payer: Medicare Other | Admitting: Physician Assistant

## 2018-06-08 NOTE — Progress Notes (Signed)
Cardiology Office Note   Date:  06/09/2018   ID:  Cassie White, DOB 04-15-1941, MRN [id number]  PCP:  Tammi Sou, MD  Cardiologist:  Martinique  Chief Complaint  Patient presents with  . Atrial Fibrillation    PAF-Focused visit.      History of Present Illness: Cassie White is a 77 y.o. female who presents for ongoing assessment and management of PAF. She was last seen in the office on 06/02/2018 and was found to be in atrial fib with RVR. She was reloaded on Flecainide 150 mg and then started on 100 mg BID for a couple of days to see if she converted. Then returned to 50 mg BID. However, she did not convert back to NSR. I spoke with Dr. Martinique, and we are planning a DCCV. She was put back on flecainide 100 mg BID until seen again today. She continues on Eliquis for anticoagulation.   She comes today feeling better, less dyspneic. EKG done today reveals that she has converted to NSR.   Past Medical History:  Diagnosis Date  . Abnormal mammogram of left breast    Likely benign microcalcifications--repeat L diag mammo 03/24/2017.  COMPLEX SCLEROSING LESION WITH CALCIFICATIONS --no sign of malignancy.  Excision recommended--pt has been referred to Dr. Donne Hazel, who recommended f/u mammo and this was normal 12/2017--consider rpt 1 yr per rad.  . Arthritis    DJD, back and both hips  . Chronic low back pain without sciatica    Lumbar spondylosis + scoliosis.  Summer 2017 Dr. Nelva Bush did ESI and pt got no relief.  Pt then saw Dr. Maia Petties [facility]: felt she had facet mediated pain; plan for B L345 MBB (??).  Marland Kitchen Diverticulosis    on colonoscopy 2004  . GERD (gastroesophageal reflux disease)   . Hx of cardiovascular stress test    Lexiscan Myoview (2/14):  Low risk, small ant defect likely breast attenuation, no ischemia, EF 69% (no change from 2010).    . Hyperlipidemia   . Hypertension    per pt  . Idiopathic scoliosis of thoracolumbar region   . Lumbar back pain   .  Mitral regurgitation    Echo (2/14):  EF 60-65%, Gr 1 DD, mild AI, mild bileaflet MVP, mod post directed MR, mild LAE, PASP 35.  Marland Kitchen MVP (mitral valve prolapse)   . 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  Responding to gabapentin (Dr. Brett Fairy).  . Osteoporosis, senile    stable at the left hip 2014-2015 (T-score -2.7 when she stopped bisphosphonate).  Took fosamax for 10 yrs.  Repeat DEXA 44mo later (09/2016) T-score -.3.1 (different machine, though).  Marland Kitchen PAF (paroxysmal atrial fibrillation) (HCC)    Dr. Martinique: Flecainide, metopr, apxiaban.  . Peripheral neuropathy 2008   Idiopathic vs familial (motor>sensory) vs lumbar disc dz: gabapentin helpful  . Shoulder pain left   RC surg 09/2014    Past Surgical History:  Procedure Laterality Date  . BREAST BIOPSY  04/10/2017   Left breast core needle biopsy of calcifications.  COMPLEX SCLEROSING LESION WITH CALCIFICATIONS --no sign of malignancy.  Excision recommended--pt has been referred to Dr. Donne Hazel, who recommended rpt mammo and this was NORMAL 12/2017.  Marland Kitchen BREAST EXCISIONAL BIOPSY Right 2010   Benign  . BREAST SURGERY  Nov 2010   Benign biopsy, right  . CHOLECYSTECTOMY  03/2010   Dr.Gross  . COLONOSCOPY  10/03/2002   No polyps.  Repeat 10 yrs recommended  but pt declines.  Pt declined cologuard 08/2016 but changed her mind and this test was NEG on 03/23/17.  . COMBINED HYSTERECTOMY VAGINAL / OOPHORECTOMY / A&P REPAIR  6314   uncertain if ovaries were removed or not  . CYSTOCELE REPAIR    . Lumpectomy  06/2009   "Fatty Necrosis"  . PFT's  2014   NORMAL  . ROTATOR CUFF REPAIR Left 10/02/14   Dr. Veverly Fells  . TONSILLECTOMY    . TOTAL KNEE ARTHROPLASTY  09/2009   Right ;Dr Alvan Dame  . TOTAL KNEE ARTHROPLASTY Left 03/10/2016   Procedure: TOTAL KNEE ARTHROPLASTY;  Surgeon: Paralee Cancel, MD;  Location: [facility];  Service: Orthopedics;  Laterality: Left;  . TRANSTHORACIC ECHOCARDIOGRAM  09/2012;  09/27/15   2014: EF 60-65%, Gr 1 DD, mild AI, mild bileaflet MVP, mod post directed MR, mild LAE, PASP 35.  Repeat 2017: EF 55-60%, mild AR, mild MVP and mild MV regurg.  Marland Kitchen VAGINAL HYSTERECTOMY     For Uterine Deviation      Current Outpatient Medications  Medication Sig Dispense Refill  . Biotin 5000 MCG CAPS Take 5,000 mcg by mouth daily.    . Calcium Carbonate (CALCIUM 600 PO) Take 600 mg by mouth 2 (two) times daily.     . cholecalciferol (VITAMIN D) 1000 UNITS tablet Take 5,000 Units by mouth daily.     . cyanocobalamin (,VITAMIN B-12,) 1000 MCG/ML injection INJECT 1 ML (1,000 MCG TOTAL) INTO THE MUSCLE EVERY 30 (THIRTY) DAYS. 10 mL 1  . ELIQUIS 5 MG TABS tablet TAKE 1 TABLET BY MOUTH TWICE A DAY 60 tablet 5  . flecainide (TAMBOCOR) 100 MG tablet Take 1 tablet (100 mg total) by mouth 2 (two) times daily. 180 tablet 3  . gabapentin (NEURONTIN) 300 MG capsule TAKE 1 CAPSULE BY MOUTH AT LUNCH, 1 AT DINNER, AND 2 AT BEDTIME 360 capsule 2  . HYDROcodone-acetaminophen (NORCO/VICODIN) 5-325 MG tablet Take 1 tablet by mouth every 8 (eight) hours. 6 tablet 0  . metoprolol tartrate (LOPRESSOR) 25 MG tablet TAKE 1 TABLET BY MOUTH TWICE A DAY 180 tablet 1  . nitroGLYCERIN (NITROSTAT) 0.4 MG SL tablet Place 1 tablet (0.4 mg total) under the tongue every 5 (five) minutes as needed for chest pain. 25 tablet 5  . NONFORMULARY OR COMPOUNDED ITEM     . Omega-3 Fatty Acids (FISH OIL) 1200 MG CAPS Take 1,000 mg by mouth 2 (two) times daily.     . pravastatin (PRAVACHOL) 40 MG tablet TAKE 1 TABLET BY MOUTH EVERY DAY 90 tablet 1   No current facility-administered medications for this visit.     Allergies:   Sulfa antibiotics and Tizanidine    Social History:  The patient  reports that she has never smoked. She has never used smokeless tobacco. She reports that she does not drink alcohol or use drugs.   Family History:  The patient's family history includes COPD in her brother; Coronary artery disease in  her brother; Heart attack in her father; Heart attack (age of onset: 40) in her son; Ovarian cancer in her mother; Stroke (age of onset: 49) in her father.    ROS: All other systems are reviewed and negative. Unless otherwise mentioned in H&P    PHYSICAL EXAM: VS:  BP 122/60   Pulse (!) 53   Ht 5\' 1"  (1.549 m)   Wt 149 lb (67.6 kg)   BMI 28.15 kg/m  , BMI Body mass index is 28.15 kg/m. GEN: Well nourished, well  developed, in no acute distress HEENT: normal Neck: no JVD, carotid bruits, or masses Cardiac: RRR; 1/6 systolic murmurs, rubs, or gallops,no edema  Respiratory:  Clear to auscultation bilaterally, normal work of breathing GI: soft, nontender, nondistended, + BS MS: no deformity or atrophy Skin: warm and dry, no rash Neuro:  Strength and sensation are intact Psych: euthymic mood, full affect   EKG:  NSR rate of 53 bpm with 1st degree AV block PR 26 ms. OTc 444 ms   Recent Labs: 09/04/2017: ALT 14 06/02/2018: BUN 18; Creatinine, Ser 0.99; Hemoglobin 13.3; Platelets 172; Potassium 5.1; Sodium 145    Lipid Panel    Component Value Date/Time   CHOL 186 03/26/2018 0946   CHOL 211 (H) 04/28/2014 1026   TRIG 133.0 03/26/2018 0946   TRIG 95 04/28/2014 1026   HDL 75.90 03/26/2018 0946   HDL 101 04/28/2014 1026   CHOLHDL 2 03/26/2018 0946   VLDL 26.6 03/26/2018 0946   LDLCALC 83 03/26/2018 0946   LDLCALC 91 04/28/2014 1026   LDLDIRECT 87.5 09/07/2012 1022      Wt Readings from Last 3 Encounters:  06/09/18 149 lb (67.6 kg)  06/02/18 152 lb (68.9 kg)  05/17/18 146 lb (66.2 kg)      Other studies Reviewed: Echocardiogram 10-06-15 Left ventricle: The cavity size was normal. There was mild   concentric hypertrophy. Systolic function was normal. The   estimated ejection fraction was in the range of 55% to 60%. - Aortic valve: There was mild regurgitation. - Mitral valve: Mild anterior leafelt prolapse. There was mild   regurgitation. - Atrial septum: No defect  or patent foramen ovale was identified. - Pulmonary arteries: PA peak pressure: 36 mm Hg (S).   ASSESSMENT AND PLAN:  1.  PAF: She was placed on 100 mg of Flecainide BID 2 days ago and has since converted to NSR. She feels better, HR is well controlled. She continues on Eliquis. She will not need DCCV. I have advised her to call us if she feels dizzy or lightheaded on higher dose of Flecainide, or had new symptoms of chest pain.  May need to titrate the dosage. She will see Dr. Martinique on 3 months or sooner if she is symptomatic. New Rx is provided for higher dose.   Current medicines are reviewed at length with the patient today.    Labs/ tests ordered today include: None   Phill Myron. West Pugh, ANP, AACC -  06/09/2018 5:05 PM    [place] Office [phone] Fax [phone]

## 2018-06-09 ENCOUNTER — Ambulatory Visit (INDEPENDENT_AMBULATORY_CARE_PROVIDER_SITE_OTHER): Payer: Medicare Other | Admitting: Adult Health

## 2018-06-09 ENCOUNTER — Encounter: Payer: Self-pay | Admitting: Adult Health

## 2018-06-09 VITALS — BP 122/60 | HR 53 | Ht 61.0 in | Wt 149.0 lb

## 2018-06-09 DIAGNOSIS — I480 Paroxysmal atrial fibrillation: Secondary | ICD-10-CM

## 2018-06-09 NOTE — Patient Instructions (Signed)
Follow-Up: You will need a follow up appointment in 3 MONTHS.  You may see  DR Martinique, Kathryn Lawrence, DNP, ANP -or- one of the following Advanced Practice Providers on your designated Care Team:    . Almyra Deforest, PA-C . Fabian Sharp, PA-C  Medication Instructions:  NO CHANGES- Your physician recommends that you continue on your current medications as directed. Please refer to the Current Medication list given to you today.  If you need a refill on your cardiac medications before your next appointment, please call your pharmacy.  Labwork: If you have labs (blood work) drawn today and your tests are completely normal, you will receive your results ONLY by: . MyChart Message (if you have MyChart) -OR- . A paper copy in the mail  At [facility], you and your health needs are our priority.  As part of our continuing mission to provide you with exceptional heart care, we have created designated Provider Care Teams.  These Care Teams include your primary Cardiologist (physician) and Advanced Practice Providers (APPs -  Physician Assistants and Nurse Practitioners) who all work together to provide you with the care you need, when you need it.  Thank you for choosing [facility] at [facility]!!

## 2018-06-11 ENCOUNTER — Encounter: Payer: Self-pay | Admitting: Family Medicine

## 2018-06-21 ENCOUNTER — Other Ambulatory Visit: Payer: Self-pay | Admitting: Adult Health

## 2018-06-21 MED ORDER — FLECAINIDE ACETATE 100 MG PO TABS: 100.0000 mg | ORAL_TABLET | Freq: Two times a day (BID) | ORAL | 3 refills | Status: DC

## 2018-06-21 NOTE — Telephone Encounter (Signed)
Rx(s) sent to pharmacy electronically.  

## 2018-06-21 NOTE — Telephone Encounter (Signed)
° ° ° °*  STAT* If patient is at the pharmacy, call can be transferred to refill team.   1. Which medications need to be refilled? (please list name of each medication and dose if known) flecainide (TAMBOCOR) 100 MG tablet  2. Which pharmacy/location (including street and city if local pharmacy) is medication to be sent to? [id number] [place], [place]  3. Do they need a 30 day or 90 day supply? [place]

## 2018-06-28 DIAGNOSIS — H02831 Dermatochalasis of right upper eyelid: Secondary | ICD-10-CM | POA: Diagnosis not present

## 2018-06-28 DIAGNOSIS — H53453 Other localized visual field defect, bilateral: Secondary | ICD-10-CM | POA: Diagnosis not present

## 2018-06-28 DIAGNOSIS — H02834 Dermatochalasis of left upper eyelid: Secondary | ICD-10-CM | POA: Diagnosis not present

## 2018-06-30 ENCOUNTER — Other Ambulatory Visit: Payer: Self-pay | Admitting: Neurology

## 2018-06-30 DIAGNOSIS — G609 Hereditary and idiopathic neuropathy, unspecified: Secondary | ICD-10-CM

## 2018-07-16 ENCOUNTER — Other Ambulatory Visit: Payer: Self-pay | Admitting: Cardiology

## 2018-07-22 ENCOUNTER — Telehealth: Payer: Self-pay | Admitting: *Deleted

## 2018-07-22 NOTE — Telephone Encounter (Signed)
   [facility] Pre-operative Risk Assessment    Request for surgical clearance:  1. What type of surgery is being performed? Bilateral upper lid blepharoplasty with CO2 laser   2. When is this surgery scheduled? TBD   3. What type of clearance is required (medical clearance vs. Pharmacy clearance to hold med vs. Both)?  both  4. Are there any medications that need to be held prior to surgery and how long? Eliquis   5. Practice name and name of physician performing surgery? [facility] Dr. Annell Greening   6. What is your office phone number [phone]    7.   What is your office fax number [phone]  8.   Anesthesia type (None, local, MAC, general) ?    Angelynn Lemus A Kian Ottaviano 07/22/2018, 4:10 PM  _________________________________________________________________   (provider comments below)

## 2018-07-25 NOTE — Telephone Encounter (Signed)
Patient with diagnosis of Afib on Eliquis for anticoagulation.    Procedure: Bilateral upper lid blepharoplasty with CO2 laser Date of procedure: TBD  CHADS2-VASc score of  4 (CHF, HTN, AGE, DM2, stroke/tia x 2, CAD, AGE, female)  CrCl 84ml/min  Per office protocol, patient can hold Eliquis for 24 hours prior to procedure.

## 2018-07-27 NOTE — Telephone Encounter (Signed)
Routing pharmacy recommendations for holding anticoagulation to requesting provider.    Burtis Junes, RN, [place] [address] [place], [place]  [address] [phone]

## 2018-08-20 ENCOUNTER — Other Ambulatory Visit: Payer: Self-pay | Admitting: Family Medicine

## 2018-08-20 DIAGNOSIS — I4891 Unspecified atrial fibrillation: Secondary | ICD-10-CM

## 2018-09-15 ENCOUNTER — Telehealth: Payer: Self-pay | Admitting: Cardiology

## 2018-09-15 NOTE — Telephone Encounter (Signed)
S/W DONNA SHE STATES THAT PT HAD FLECAINIDE FILLED FOR PT AND THEY GAVE HER 50MG  NOT THE 100MG . [facility] THAT IT WAS FILLED SOMEWHERE ELSE AND THEY HAVE TO GO BACK THERE TO HAVE THEM FIX IT. DONNA STATES THAT SHE SAW THE BOTTLE AND IT WAS FILL THERE. INFORMED HER THAT AT THE LEAST SHE WILL HAVE TO TAKE 2 UNTIL GONE THEN HAVE THE 100MG  FILLED AND CALL FOR REFILL. VERBALIZES UNDERSTANDING.SHE WILL CB IF ANY ISSUES THEN

## 2018-09-15 NOTE — Telephone Encounter (Signed)
Patient's daughter Cassie White called stating the [id number] [place] [place] dispensed only 50mg  tablet to to her mother not 100mg  tablet. They told her they only have her down as taking 50mg , not 100mg .  She states her mother takes 100mg . She would like for someone to call the pharmacy to check on that.

## 2018-09-27 ENCOUNTER — Ambulatory Visit (INDEPENDENT_AMBULATORY_CARE_PROVIDER_SITE_OTHER): Payer: Medicare Other | Admitting: Family Medicine

## 2018-09-27 ENCOUNTER — Encounter: Payer: Self-pay | Admitting: Family Medicine

## 2018-09-27 VITALS — BP 125/71 | HR 50 | Temp 97.4°F | Resp 18 | Ht 61.0 in | Wt 149.1 lb

## 2018-09-27 DIAGNOSIS — R251 Tremor, unspecified: Secondary | ICD-10-CM

## 2018-09-27 DIAGNOSIS — E663 Overweight: Secondary | ICD-10-CM

## 2018-09-27 DIAGNOSIS — E7800 Pure hypercholesterolemia, unspecified: Secondary | ICD-10-CM | POA: Diagnosis not present

## 2018-09-27 DIAGNOSIS — I480 Paroxysmal atrial fibrillation: Secondary | ICD-10-CM

## 2018-09-27 DIAGNOSIS — I10 Essential (primary) hypertension: Secondary | ICD-10-CM

## 2018-09-27 NOTE — Progress Notes (Signed)
OFFICE VISIT  09/27/2018   CC:  Chief Complaint  Patient presents with  . Follow-up    F/U [facility]. No complaints. Pt is fasting.      HPI:    Patient is a 78 y.o. Caucasian female who presents for 6 mo f/u HTN, HLD, tremor. No acute complaints.  Tremor: Last visit I weened her of gabapentin b/c of concern that this was causing her tremors.   She forgot to ween of the med as we had discussed.  Still taking it the same way. Tremor of hands same: R a bit worse than L as per her usual.  HTN: occ home bp monitoring shows normal bp's. Has PAF, converted on flecainide as of last cardiology f/u 06/09/2018. Taking eliquis.  No signs of bleeding.  HLD: takes pravastatin daily w/out problem. Trying to eat low chol/low fat diet. Doesn't get too much activity but is not sedentary.  Past Medical History:  Diagnosis Date  . Abnormal mammogram of left breast    Likely benign microcalcifications--repeat L diag mammo 03/24/2017.  COMPLEX SCLEROSING LESION WITH CALCIFICATIONS --no sign of malignancy.  Excision recommended--pt has been referred to Dr. Donne Hazel, who recommended f/u mammo and this was normal 12/2017--consider rpt 1 yr per rad.  . Arthritis    DJD, back and both hips  . Chronic low back pain without sciatica    Lumbar spondylosis + scoliosis.  Summer 2017 Dr. Nelva Bush did ESI and pt got no relief.  Pt then saw Dr. Maia Petties with [facility]: felt she had facet mediated pain; plan for B L345 MBB (??).  Marland Kitchen Diverticulosis    on colonoscopy 2004  . GERD (gastroesophageal reflux disease)   . Hx of cardiovascular stress test    Lexiscan Myoview (2/14):  Low risk, small ant defect likely breast attenuation, no ischemia, EF 69% (no change from 2010).    . Hyperlipidemia   . Hypertension    per pt  . Idiopathic scoliosis of thoracolumbar region   . Lumbar back pain   . Mitral regurgitation    Echo (2/14):  EF 60-65%, Gr 1 DD, mild AI, mild bileaflet MVP, mod post directed MR, mild LAE,  PASP 35.  Marland Kitchen MVP (mitral valve prolapse)   . 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  Responding to gabapentin (Dr. Brett Fairy).  . Osteoporosis, senile    stable at the left hip 2014-2015 (T-score -2.7 when she stopped bisphosphonate).  Took fosamax for 10 yrs.  Repeat DEXA 82mo later (09/2016) T-score -.3.1 (different machine, though).  Marland Kitchen PAF (paroxysmal atrial fibrillation) (HCC)    Dr. Martinique: Flecainide, metopr, apxiaban.  08/2017-->persistent a-fib w/RVR, flecainide increased and she converted back to NSR and felt better.  100mg  bid flecainide continued.  . Peripheral neuropathy 2008   Idiopathic vs familial (motor>sensory) vs lumbar disc dz: gabapentin helpful  . Shoulder pain left   RC surg 09/2014    Past Surgical History:  Procedure Laterality Date  . BREAST BIOPSY  04/10/2017   Left breast core needle biopsy of calcifications.  COMPLEX SCLEROSING LESION WITH CALCIFICATIONS --no sign of malignancy.  Excision recommended--pt has been referred to Dr. Donne Hazel, who recommended rpt mammo and this was NORMAL 12/2017.  Marland Kitchen BREAST EXCISIONAL BIOPSY Right 2010   Benign  . BREAST SURGERY  Nov 2010   Benign biopsy, right  . CHOLECYSTECTOMY  03/2010   Dr.Gross  . COLONOSCOPY  10/03/2002   No polyps.  Repeat 10 yrs recommended but pt  declines.  Pt declined cologuard 08/2016 but changed her mind and this test was NEG on 03/23/17.  . COMBINED HYSTERECTOMY VAGINAL / OOPHORECTOMY / A&P REPAIR  5188   uncertain if ovaries were removed or not  . CYSTOCELE REPAIR    . Lumpectomy  06/2009   "Fatty Necrosis"  . PFT's  2014   NORMAL  . ROTATOR CUFF REPAIR Left 10/02/14   Dr. Veverly Fells  . TONSILLECTOMY    . TOTAL KNEE ARTHROPLASTY  09/2009   Right ;Dr Alvan Dame  . TOTAL KNEE ARTHROPLASTY Left 03/10/2016   Procedure: TOTAL KNEE ARTHROPLASTY;  Surgeon: Paralee Cancel, MD;  Location: [facility];  Service: Orthopedics;  Laterality: Left;  . TRANSTHORACIC ECHOCARDIOGRAM   09/2012; 09/27/15   2014: EF 60-65%, Gr 1 DD, mild AI, mild bileaflet MVP, mod post directed MR, mild LAE, PASP 35.  Repeat 2017: EF 55-60%, mild AR, mild MVP and mild MV regurg.  Marland Kitchen VAGINAL HYSTERECTOMY     For Uterine Deviation     Outpatient Medications Prior to Visit  Medication Sig Dispense Refill  . Biotin 5000 MCG CAPS Take 5,000 mcg by mouth daily.    . Calcium Carbonate (CALCIUM 600 PO) Take 600 mg by mouth 2 (two) times daily.     . cholecalciferol (VITAMIN D) 1000 UNITS tablet Take 5,000 Units by mouth daily.     . cyanocobalamin (,VITAMIN B-12,) 1000 MCG/ML injection INJECT 1 ML (1,000 MCG TOTAL) INTO THE MUSCLE EVERY 30 (THIRTY) DAYS. 10 mL 1  . ELIQUIS 5 MG TABS tablet TAKE 1 TABLET BY MOUTH TWICE A DAY 60 tablet 6  . flecainide (TAMBOCOR) 100 MG tablet Take 1 tablet (100 mg total) by mouth 2 (two) times daily. 180 tablet 3  . gabapentin (NEURONTIN) 300 MG capsule TAKE 1 CAPSULE BY MOUTH AT LUNCH, 1 AT DINNER, AND 2 AT BEDTIME 360 capsule 2  . HYDROcodone-acetaminophen (NORCO/VICODIN) 5-325 MG tablet Take 1 tablet by mouth every 8 (eight) hours. 6 tablet 0  . metoprolol tartrate (LOPRESSOR) 25 MG tablet TAKE 1 TABLET BY MOUTH TWICE A DAY 180 tablet 1  . nitroGLYCERIN (NITROSTAT) 0.4 MG SL tablet Place 1 tablet (0.4 mg total) under the tongue every 5 (five) minutes as needed for chest pain. 25 tablet 5  . NONFORMULARY OR COMPOUNDED ITEM     . Omega-3 Fatty Acids (FISH OIL) 1200 MG CAPS Take 1,000 mg by mouth 2 (two) times daily.     . pravastatin (PRAVACHOL) 40 MG tablet TAKE 1 TABLET BY MOUTH EVERY DAY 90 tablet 1   No facility-administered medications prior to visit.     Allergies  Allergen Reactions  . Sulfa Antibiotics Swelling    face  . Tizanidine Other (See Comments)    delirium    ROS As per HPI  PE: Blood pressure 125/71, pulse (!) 50, temperature (!) 97.4 F (36.3 C), temperature source Oral, resp. rate 18, height 5\' 1"  (1.549 m), weight 149 lb 2 oz (67.6  kg), SpO2 96 %. Gen: Alert, well appearing.  Patient is oriented to person, place, time, and situation. AFFECT: pleasant, lucid thought and speech. CV: RRR, no m/r/g.   LUNGS: CTA bilat, nonlabored resps, good aeration in all lung fields. Neuro: CN 2-12 intact bilaterally, strength 5/5 in proximal and distal upper extremities and lower extremities bilaterally.  Very mild tremor in R hand and I can't see any tremor in L hand-->when arms held outstretched in front of her.  No tremor with hands on lap, no  intention tremor.  Gait is normal. Facial expression: normal range/variation.  No shuffling gait.  No rigidity.  LABS:  Lab Results  Component Value Date   TSH 1.63 09/24/2015   Lab Results  Component Value Date   WBC 6.1 06/02/2018   HGB 13.3 06/02/2018   HCT 39.7 06/02/2018   MCV 92 06/02/2018   PLT 172 06/02/2018   Lab Results  Component Value Date   CREATININE 0.99 06/02/2018   BUN 18 06/02/2018   NA 145 (H) 06/02/2018   K 5.1 06/02/2018   CL 107 (H) 06/02/2018   CO2 26 06/02/2018   Lab Results  Component Value Date   ALT 14 09/04/2017   AST 16 09/04/2017   ALKPHOS 64 09/04/2017   BILITOT 0.8 09/04/2017   Lab Results  Component Value Date   CHOL 186 03/26/2018   Lab Results  Component Value Date   HDL 75.90 03/26/2018   Lab Results  Component Value Date   LDLCALC 83 03/26/2018   Lab Results  Component Value Date   TRIG 133.0 03/26/2018   Lab Results  Component Value Date   CHOLHDL 2 03/26/2018   Lab Results  Component Value Date   HGBA1C  09/19/2010    5.3 (NOTE)                                                                       According to the ADA Clinical Practice Recommendations for 2011, when HbA1c is used as a screening test:   >=6.5%   Diagnostic of Diabetes Mellitus           (if abnormal result  is confirmed)  5.7-6.4%   Increased risk of developing Diabetes Mellitus  References:Diagnosis and Classification of Diabetes Mellitus,Diabetes  YQIH,4742,59(DGLOV 1):S62-S69 and Standards of Medical Care in         Diabetes - 2011,Diabetes Care,2011,34  (Suppl 1):S11-S61.     IMPRESSION AND PLAN:  1) Tremor: R hand minimal, L hand barely detectable. Despite being minimal, it seems to really bother her, at least from a psychological standpoint. Per pt, this did not start until she got on gabapentin. Gabapentin not particularly helpful for her neuropathic pain. Plan is to ween off gabapentin and see how things go (this was the plan last visit but she seems to have forgotten to do it). Instructions: Take one gabapentin tab twice per day for 2 weeks, then take 1 gabapentin tab once per day for 2 weeks, then take 1 gabapentin tab once every other day for 5 doses, then stop this med completely.  2) HTN: The current medical regimen is effective;  continue present plan and medications.  3) PAF: regular rhythm today.  Doing well on flecainide, metoprolol, and eliquis. CBC normal 06/03/2019, as were lytes/cr. Repeat these labs at f/u in 6 mo.  4) HLD: tolerating statin well.  Last lipids 03/2018 excellent.  Plan repeat of these + AST/ALT 6 mo.  An After Visit Summary was printed and given to the patient.  FOLLOW UP: Return in about 6 months (around 03/28/2019) for routine chronic illness f/u.  .Signed:  Crissie Sickles, MD           09/27/2018

## 2018-09-27 NOTE — Patient Instructions (Signed)
  Plan is to ween off gabapentin and see if your tremor goes away.  Instructions: Take one gabapentin tab twice per day for 2 weeks, then take 1 gabapentin tab once per day for 2 weeks, then take 1 gabapentin tab once every other day for 5 doses, then stop this med completely.

## 2018-09-28 NOTE — Progress Notes (Signed)
Cardiology Office Note    Date:  10/01/2018   ID:  FAIGY STRETCH, DOB 1941-02-02, MRN [id number]  PCP:  Tammi Sou, MD  Cardiologist:  Dr. Martinique  Chief Complaint  Patient presents with  . Atrial Fibrillation    History of Present Illness:  Cassie White is a 78 y.o. female with PMH of MVP, MR, HLD, GERD and PAF. She had a Myoview in 2010 with anterior breast attenuation, otherwise normal. Myoview obtained in February 2014 was low risk, small anterior defect likely breast attenuation, however no ischemia, EF 69%. Echocardiogram obtained in February 2014 showed EF 76-72%, grade 1 diastolic dysfunction, moderate posterior directed MR, PASP 35 mmHg. Last echocardiogram obtained on 09/27/2015 showed EF 55-60%, mild AR, mild MR, PA peak pressure 36 mmHg. Patient was noted to be in atrial fibrillation during the study. She was placed on eliquis and flecainide and subsequently went back to sinus rhythm. She underwent Myoview on 10/18/2015 to rule out pro arrhythmia and the ischemic heart disease, there was moderate sized and intensity partially reversible anterior and apical defect, EF was 64%, this is likely representing shifting breast artifact, overall it was considered intermediate risk study. The study was reviewed by Dr. Martinique, no further testing was felt to be needed, the image was consider low risk.  She was seen on 11/21/2016 for preoperative clearance, she was doing well at the time, she was cleared to proceed with back surgery.   She did go to the hospital on 02/14/2017 with a large bruise on the medial aspect of the left thigh. This occurred in the absence of trauma. She was on eliquis. This was managed conservatively.  She was seen in October 2019 with recurrent and persistent Afib with rate of 123. Her Flecainide dose was increased to 100 mg bid and she converted to NSR.   On follow up today she is doing very well from a cardiac standpoint. She denies any recurrent Afib since  Flecainide dose increased. No chest pain, dyspnea, edema. Seen recently by Dr. Anitra Lauth and noted tremor. Tried to taper off gabapentin but Neuropathy pain increased so she went back on it.    Past Medical History:  Diagnosis Date  . Abnormal mammogram of left breast    Likely benign microcalcifications--repeat L diag mammo 03/24/2017.  COMPLEX SCLEROSING LESION WITH CALCIFICATIONS --no sign of malignancy.  Excision recommended--pt has been referred to Dr. Donne Hazel, who recommended f/u mammo and this was normal 12/2017--consider rpt 1 yr per rad.  . Arthritis    DJD, back and both hips  . Chronic low back pain without sciatica    Lumbar spondylosis + scoliosis.  Summer 2017 Dr. Nelva Bush did ESI and pt got no relief.  Pt then saw Dr. Maia Petties [facility]: felt she had facet mediated pain; plan for B L345 MBB (??).  Marland Kitchen Diverticulosis    on colonoscopy 2004  . GERD (gastroesophageal reflux disease)   . Hx of cardiovascular stress test    Lexiscan Myoview (2/14):  Low risk, small ant defect likely breast attenuation, no ischemia, EF 69% (no change from 2010).    . Hyperlipidemia   . Hypertension    per pt  . Idiopathic scoliosis of thoracolumbar region   . Lumbar back pain   . Mitral regurgitation    Echo (2/14):  EF 60-65%, Gr 1 DD, mild AI, mild bileaflet MVP, mod post directed MR, mild LAE, PASP 35.  Marland Kitchen MVP (mitral valve prolapse)   . 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  Responding to gabapentin (Dr. Brett Fairy).  . Osteoporosis, senile    stable at the left hip 2014-2015 (T-score -2.7 when she stopped bisphosphonate).  Took fosamax for 10 yrs.  Repeat DEXA 65mo later (09/2016) T-score -.3.1 (different machine, though).  Marland Kitchen PAF (paroxysmal atrial fibrillation) (HCC)    Dr. Martinique: Flecainide, metopr, apxiaban.  08/2017-->persistent a-fib w/RVR, flecainide increased and she converted back to NSR and felt better.  100mg  bid flecainide  continued.  . Peripheral neuropathy 2008   Idiopathic vs familial (motor>sensory) vs lumbar disc dz: gabapentin helpful  . Shoulder pain left   RC surg 09/2014    Past Surgical History:  Procedure Laterality Date  . BREAST BIOPSY  04/10/2017   Left breast core needle biopsy of calcifications.  COMPLEX SCLEROSING LESION WITH CALCIFICATIONS --no sign of malignancy.  Excision recommended--pt has been referred to Dr. Donne Hazel, who recommended rpt mammo and this was NORMAL 12/2017.  Marland Kitchen BREAST EXCISIONAL BIOPSY Right 2010   Benign  . BREAST SURGERY  Nov 2010   Benign biopsy, right  . CHOLECYSTECTOMY  03/2010   Dr.Gross  . COLONOSCOPY  10/03/2002   No polyps.  Repeat 10 yrs recommended but pt declines.  Pt declined cologuard 08/2016 but changed her mind and this test was NEG on 03/23/17.  . COMBINED HYSTERECTOMY VAGINAL / OOPHORECTOMY / A&P REPAIR  3154   uncertain if ovaries were removed or not  . CYSTOCELE REPAIR    . Lumpectomy  06/2009   "Fatty Necrosis"  . PFT's  2014   NORMAL  . ROTATOR CUFF REPAIR Left 10/02/14   Dr. Veverly Fells  . TONSILLECTOMY    . TOTAL KNEE ARTHROPLASTY  09/2009   Right ;Dr Alvan Dame  . TOTAL KNEE ARTHROPLASTY Left 03/10/2016   Procedure: TOTAL KNEE ARTHROPLASTY;  Surgeon: Paralee Cancel, MD;  Location: [facility];  Service: Orthopedics;  Laterality: Left;  . TRANSTHORACIC ECHOCARDIOGRAM  09/2012; 09/27/15   2014: EF 60-65%, Gr 1 DD, mild AI, mild bileaflet MVP, mod post directed MR, mild LAE, PASP 35.  Repeat 2017: EF 55-60%, mild AR, mild MVP and mild MV regurg.  Marland Kitchen VAGINAL HYSTERECTOMY     For Uterine Deviation     Current Medications: Outpatient Medications Prior to Visit  Medication Sig Dispense Refill  . Biotin 5000 MCG CAPS Take 5,000 mcg by mouth daily.    . Calcium Carbonate (CALCIUM 600 PO) Take 600 mg by mouth 2 (two) times daily.     . cholecalciferol (VITAMIN D) 1000 UNITS tablet Take 5,000 Units by mouth daily.     . cyanocobalamin (,VITAMIN B-12,) 1000 MCG/ML  injection INJECT 1 ML (1,000 MCG TOTAL) INTO THE MUSCLE EVERY 30 (THIRTY) DAYS. 10 mL 1  . ELIQUIS 5 MG TABS tablet TAKE 1 TABLET BY MOUTH TWICE A DAY 60 tablet 6  . flecainide (TAMBOCOR) 100 MG tablet Take 1 tablet (100 mg total) by mouth 2 (two) times daily. 180 tablet 1  . gabapentin (NEURONTIN) 300 MG capsule TAKE 1 CAPSULE BY MOUTH AT LUNCH, 1 AT DINNER, AND 2 AT BEDTIME 360 capsule 2  . HYDROcodone-acetaminophen (NORCO/VICODIN) 5-325 MG tablet Take 1 tablet by mouth every 8 (eight) hours. 6 tablet 0  . metoprolol tartrate (LOPRESSOR) 25 MG tablet TAKE 1 TABLET BY MOUTH TWICE A DAY 180 tablet 1  . nitroGLYCERIN (NITROSTAT) 0.4 MG SL tablet Place 1 tablet (0.4 mg total) under the tongue every 5 (five) minutes as needed  for chest pain. 25 tablet 5  . NONFORMULARY OR COMPOUNDED ITEM     . Omega-3 Fatty Acids (FISH OIL) 1200 MG CAPS Take 1,000 mg by mouth 2 (two) times daily.     . pravastatin (PRAVACHOL) 40 MG tablet TAKE 1 TABLET BY MOUTH EVERY DAY 90 tablet 1   No facility-administered medications prior to visit.      Allergies:   Sulfa antibiotics and Tizanidine   Social History   Socioeconomic History  . Marital status: Widowed    Spouse name: Not on file  . Number of children: 3  . Years of education: HS grad  . Highest education level: Not on file  Occupational History  . 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 Financial resource strain: Not on file  . Food insecurity:    Worry: Not on file    Inability: Not on file  . Transportation needs:    Medical: Not on file    Non-medical: Not on file  Tobacco Use  . Smoking status: Never Smoker  . Smokeless tobacco: Never Used  Substance and Sexual Activity  . Alcohol use: No    Alcohol/week: 0.0 standard drinks  . Drug use: No  . Sexual activity: Never    Birth control/protection: Post-menopausal  Lifestyle  . Physical activity:    Days per week: Not on file    Minutes per session: Not on file  .  Stress: Not on file  Relationships  . Social connections:    Talks on phone: Not on file    Gets together: Not on file    Attends religious service: Not on file    Active member of club or organization: Not on file    Attends meetings of clubs or organizations: Not on file    Relationship status: Not on file  Other Topics Concern  . Not on file  Social History Narrative   Widow, has 3 children, 4 grandchildren.   Orig from [place].   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   Caffiene, 1 cup daily avg.   No tob, no alc, no drugs.   Exercise: walking, limited by bilat hip pain.     Family History:  The patient's family history includes COPD in her brother; Coronary artery disease in her brother; Heart attack in her father; Heart attack (age of onset: 44) in her son; Ovarian cancer in her mother; Stroke (age of onset: 7) in her father.   ROS:   Please see the history of present illness.    ROS All other systems reviewed and are negative.   PHYSICAL EXAM:   VS:  BP 124/72   Pulse (!) 109   Ht 5\' 1"  (1.549 m)   Wt 149 lb 12.8 oz (67.9 kg)   SpO2 94%   BMI 28.30 kg/m    GENERAL:  Well appearing WF in NAD HEENT:  PERRL, EOMI, sclera are clear. Oropharynx is clear. NECK:  No jugular venous distention, carotid upstroke brisk and symmetric, no bruits, no thyromegaly or adenopathy LUNGS:  Clear to auscultation bilaterally CHEST:  Unremarkable HEART:  RRR,  PMI not displaced or sustained,S1 and S2 within normal limits, no S3, no S4: no clicks, no rubs, soft systolic murmur at the apex. ABD:  Soft, nontender. BS +, no masses or bruits. No hepatomegaly, no splenomegaly EXT:  2 + pulses throughout, no edema, no cyanosis no clubbing SKIN:  Warm and dry.  No rashes  NEURO:  Alert and oriented x 3. Cranial nerves II through XII intact. PSYCH:  Cognitively intact      Wt Readings from Last 3 Encounters:  10/01/18 149 lb 12.8 oz (67.9 kg)  09/27/18  149 lb 2 oz (67.6 kg)  06/09/18 149 lb (67.6 kg)      Studies/Labs Reviewed:   EKG:  EKG is not ordered today.  Recent Labs: 06/02/2018: BUN 18; Creatinine, Ser 0.99; Hemoglobin 13.3; Platelets 172; Potassium 5.1; Sodium 145   Lipid Panel    Component Value Date/Time   CHOL 186 03/26/2018 0946   CHOL 211 (H) 04/28/2014 1026   TRIG 133.0 03/26/2018 0946   TRIG 95 04/28/2014 1026   HDL 75.90 03/26/2018 0946   HDL 101 04/28/2014 1026   CHOLHDL 2 03/26/2018 0946   VLDL 26.6 03/26/2018 0946   LDLCALC 83 03/26/2018 0946   LDLCALC 91 04/28/2014 1026   LDLDIRECT 87.5 09/07/2012 1022    Additional studies/ records that were reviewed today include:   Echo 09/27/2015 LV EF: 55% -   60%  Study Conclusions  - Left ventricle: The cavity size was normal. There was mild   concentric hypertrophy. Systolic function was normal. The   estimated ejection fraction was in the range of 55% to 60%. - Aortic valve: There was mild regurgitation. - Mitral valve: Mild anterior leafelt prolapse. There was mild   regurgitation. - Atrial septum: No defect or patent foramen ovale was identified. - Pulmonary arteries: PA peak pressure: 36 mm Hg (S).   Myoview 10/18/2015 Study Highlights    The left ventricular ejection fraction is moderately decreased (30-44%).  Nuclear stress EF: 64%.  There was no ST segment deviation noted during stress.  No T wave inversion was noted during stress.  Defect 1: There is a medium defect of moderate severity.  This is an intermediate risk study.  Findings consistent with prior myocardial infarction with peri-infarct ischemia.   Moderate size and intensity partially reversible anterior and apical wall defect (SDS 4). LVEF 64% with normal wall motion. This likely represents shifting breast artifact given normal wall motion and LV function. Clinical correlation is advised. This is an intermediate risk study.      ASSESSMENT:    1. Paroxysmal atrial  fibrillation with rapid ventricular response (HCC)   2. Moderate mitral regurgitation   3. Mitral valve prolapse      PLAN:  In order of problems listed above:  1. PAF: doing well on increased dose of flecainide. Continues on metoprolol and Eliquis.   No bleeding issues. Will continue current therapy.  2. Hyperlipidemia: Last LDL 83.   3. Moderate mitral regurgitation with MV prolapse:  She is asymptomatic. Will monitor.    Medication Adjustments/Labs and Tests Ordered: Current medicines are reviewed at length with the patient today.  Concerns regarding medicines are outlined above.  Medication changes, Labs and Tests ordered today are listed in the Patient Instructions below. There are no Patient Instructions on file for this visit.   Signed,  Martin[place], MD  10/01/2018 9:54 AM    [facility] [address], [place], [place]  [address] Phone: [phone]; Fax: [phone]

## 2018-09-30 ENCOUNTER — Other Ambulatory Visit: Payer: Self-pay | Admitting: Cardiology

## 2018-09-30 DIAGNOSIS — I480 Paroxysmal atrial fibrillation: Secondary | ICD-10-CM

## 2018-09-30 MED ORDER — FLECAINIDE ACETATE 100 MG PO TABS: 100.0000 mg | ORAL_TABLET | Freq: Two times a day (BID) | ORAL | 1 refills | Status: DC

## 2018-09-30 NOTE — Telephone Encounter (Signed)
Rx(s) sent to pharmacy electronically.  

## 2018-10-01 ENCOUNTER — Ambulatory Visit (INDEPENDENT_AMBULATORY_CARE_PROVIDER_SITE_OTHER): Payer: Medicare Other | Admitting: Cardiology

## 2018-10-01 ENCOUNTER — Encounter: Payer: Self-pay | Admitting: Cardiology

## 2018-10-01 VITALS — BP 124/72 | HR 109 | Ht 61.0 in | Wt 149.8 lb

## 2018-10-01 DIAGNOSIS — I340 Nonrheumatic mitral (valve) insufficiency: Secondary | ICD-10-CM | POA: Diagnosis not present

## 2018-10-01 DIAGNOSIS — I480 Paroxysmal atrial fibrillation: Secondary | ICD-10-CM

## 2018-10-01 DIAGNOSIS — I341 Nonrheumatic mitral (valve) prolapse: Secondary | ICD-10-CM | POA: Diagnosis not present

## 2018-10-01 MED ORDER — FLECAINIDE ACETATE 100 MG PO TABS: 100.0000 mg | ORAL_TABLET | Freq: Two times a day (BID) | ORAL | 3 refills | Status: DC

## 2018-10-01 MED ORDER — PRAVASTATIN SODIUM 40 MG PO TABS: 40.0000 mg | ORAL_TABLET | Freq: Every day | ORAL | 3 refills | Status: DC

## 2018-10-15 DIAGNOSIS — J019 Acute sinusitis, unspecified: Secondary | ICD-10-CM | POA: Diagnosis not present

## 2018-10-15 DIAGNOSIS — B9689 Other specified bacterial agents as the cause of diseases classified elsewhere: Secondary | ICD-10-CM | POA: Diagnosis not present

## 2018-10-15 DIAGNOSIS — Z6828 Body mass index (BMI) 28.0-28.9, adult: Secondary | ICD-10-CM | POA: Diagnosis not present

## 2018-12-07 DIAGNOSIS — G609 Hereditary and idiopathic neuropathy, unspecified: Secondary | ICD-10-CM | POA: Diagnosis not present

## 2018-12-09 DIAGNOSIS — H2513 Age-related nuclear cataract, bilateral: Secondary | ICD-10-CM | POA: Diagnosis not present

## 2018-12-09 DIAGNOSIS — H43811 Vitreous degeneration, right eye: Secondary | ICD-10-CM | POA: Diagnosis not present

## 2018-12-23 DIAGNOSIS — M25511 Pain in right shoulder: Secondary | ICD-10-CM | POA: Diagnosis not present

## 2018-12-29 ENCOUNTER — Telehealth: Payer: Self-pay

## 2018-12-29 NOTE — Telephone Encounter (Signed)
Pt has appointment on 6/19.  Copied from [place] [id number]. Topic: Appointment Scheduling - Scheduling Inquiry for Clinic >> Dec 28, 2018  4:29 PM Vernona Rieger wrote: Reason for CRM: patient's daughter, Butch Penny called and would like to set up a visit with Dr Anitra Lauth for her to be evaluated again for tremors. >> Dec 29, 2018 10:13 AM [place], Marylu Lund wrote: Called patient's daughter. She was reluctant to schedule a virtual visit. She said she is going to talk to her mom and call us back.

## 2018-12-30 ENCOUNTER — Other Ambulatory Visit: Payer: Self-pay | Admitting: Cardiology

## 2018-12-30 NOTE — Telephone Encounter (Signed)
°*  STAT* If patient is at the pharmacy, call can be transferred to refill team.   1. Which medications need to be refilled? (please list name of each medication and dose if known) flecainide (TAMBOCOR) 100 MG tablet  2. Which pharmacy/location (including street and city if local pharmacy) is medication to be sent to? [id number] - [place], [place]  3. Do they need a 30 day or 90 day supply? 90 days

## 2018-12-31 DIAGNOSIS — Z79891 Long term (current) use of opiate analgesic: Secondary | ICD-10-CM | POA: Diagnosis not present

## 2018-12-31 DIAGNOSIS — M7062 Trochanteric bursitis, left hip: Secondary | ICD-10-CM | POA: Diagnosis not present

## 2018-12-31 DIAGNOSIS — M4120 Other idiopathic scoliosis, site unspecified: Secondary | ICD-10-CM | POA: Diagnosis not present

## 2018-12-31 DIAGNOSIS — M47816 Spondylosis without myelopathy or radiculopathy, lumbar region: Secondary | ICD-10-CM | POA: Diagnosis not present

## 2018-12-31 DIAGNOSIS — G894 Chronic pain syndrome: Secondary | ICD-10-CM | POA: Diagnosis not present

## 2018-12-31 DIAGNOSIS — M545 Low back pain: Secondary | ICD-10-CM | POA: Diagnosis not present

## 2018-12-31 DIAGNOSIS — Z79899 Other long term (current) drug therapy: Secondary | ICD-10-CM | POA: Diagnosis not present

## 2019-01-03 MED ORDER — FLECAINIDE ACETATE 100 MG PO TABS: 100.0000 mg | ORAL_TABLET | Freq: Two times a day (BID) | ORAL | 3 refills | Status: DC

## 2019-01-03 NOTE — Telephone Encounter (Signed)
Tambocor refilled.

## 2019-01-10 DIAGNOSIS — M25511 Pain in right shoulder: Secondary | ICD-10-CM | POA: Insufficient documentation

## 2019-01-12 DIAGNOSIS — M25511 Pain in right shoulder: Secondary | ICD-10-CM | POA: Diagnosis not present

## 2019-01-19 ENCOUNTER — Telehealth: Payer: Self-pay

## 2019-01-19 NOTE — Telephone Encounter (Signed)
Patient stated that she has no one to help her with a mychart visit. I offered her a in office visit, she declined. She stated that she doesn't need to be seen for her neuropathy and that she wanted to her appt to be cancelled. Her appt has been cancelled.

## 2019-01-20 ENCOUNTER — Ambulatory Visit: Payer: Medicare Other | Admitting: Adult Health

## 2019-01-21 ENCOUNTER — Ambulatory Visit: Payer: Medicare Other | Admitting: Family Medicine

## 2019-01-26 ENCOUNTER — Encounter: Payer: Self-pay | Admitting: Family Medicine

## 2019-01-26 ENCOUNTER — Other Ambulatory Visit: Payer: Self-pay

## 2019-01-26 ENCOUNTER — Ambulatory Visit (INDEPENDENT_AMBULATORY_CARE_PROVIDER_SITE_OTHER): Payer: Medicare Other | Admitting: Family Medicine

## 2019-01-26 VITALS — Ht 61.0 in | Wt 146.0 lb

## 2019-01-26 DIAGNOSIS — R251 Tremor, unspecified: Secondary | ICD-10-CM | POA: Diagnosis not present

## 2019-01-26 DIAGNOSIS — R2689 Other abnormalities of gait and mobility: Secondary | ICD-10-CM

## 2019-01-26 MED ORDER — PRIMIDONE 50 MG PO TABS: ORAL_TABLET | ORAL | 0 refills | Status: DC

## 2019-01-26 MED ORDER — PRIMIDONE 50 MG PO TABS: 50.0000 mg | ORAL_TABLET | Freq: Two times a day (BID) | ORAL | 0 refills | Status: DC

## 2019-01-26 NOTE — Progress Notes (Signed)
Virtual Visit via Video Note  I connected with Cassie White on 01/26/19 at 11:30 AM EDT by a video enabled telemedicine application and verified that I am speaking with the correct person using two identifiers.  Location patient: home Location provider:work or home office Persons participating in the virtual visit: patient's daughter, myself.  I discussed the limitations of evaluation and management by telemedicine and the availability of in person appointments. The patient expressed understanding and agreed to proceed.  Telemedicine visit is a necessity given the COVID-19 restrictions in place at the current time.  HPI: 78 y/o WF being seen today for 4 mo f/u of bilat (R>L) arms tremor. Last visit our plan was to ween off gabapentin and see if tremor resolved b/c she was certain that the tremors didn't start until she got on gabapentin for neuropathic pain treatment. She asks if I will take over rx'ing responsibilities for this med b/c Cassie White does not plan on following up with Dr. Brett Fairy, the original prescriber.  Stopped gabapentin for a full week, noted no change in tremor but neuropathic pain returned so she restarted gabapentin.  Tremor is worsening, R >L as per her usual.  She is going to have R reverse total shoulder replacement.  She has never been on med to treat tremor in the past. Sometimes spills food some when trying to bring it to her mouth.  She can open a jar fine.  She can roll over in bed.   Handwriting is messy but not really any smaller than her normal.   ROS: no CP, no SOB, no wheezing, no cough, no dizziness, no HAs, no rashes, no melena/hematochezia.  No polyuria or polydipsia.  No myalgias or arthralgias.   Past Medical History:  Diagnosis Date  . Abnormal mammogram of left breast    Likely benign microcalcifications--repeat L diag mammo 03/24/2017.  COMPLEX SCLEROSING LESION WITH CALCIFICATIONS --no sign of malignancy.  Excision recommended--Cassie White has been referred to Dr.  Donne Hazel, who recommended f/u mammo and this was normal 12/2017--consider rpt 1 yr per rad.  . Arthritis    DJD, back and both hips  . Chronic low back pain without sciatica    Lumbar spondylosis + scoliosis.  Summer 2017 Dr. Nelva Bush did ESI and Cassie White got no relief.  Cassie White then saw Dr[facility] Maia [facility]ties [facility]: felt she had facet mediated pain; plan for B L345 MBB (??).  Marland Kitchen Diverticulosis    on colonoscopy 2004  . GERD (gastroesophageal reflux disease)   . Hx of cardiovascular stress test    Lexiscan Myoview (2/14):  Low risk, small ant defect likely breast attenuation, no ischemia, EF 69% (no change from 2010).    . Hyperlipidemia   . Hypertension    per Cassie White  . Idiopathic scoliosis of thoracolumbar region   . Lumbar back pain   . Mitral regurgitation    Echo (2/14):  EF 60-65%, Gr 1 DD, mild AI, mild bileaflet MVP, mod post directed MR, mild LAE, PASP 35.  Marland Kitchen MVP (mitral valve prolapse)   . 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  Responding to gabapentin (Dr. Brett Fairy).  . Osteoporosis, senile    stable at the left hip 2014-2015 (T-score -2.7 when she stopped bisphosphonate).  Took fosamax for 10 yrs.  Repeat DEXA 80mo later (09/2016) T-score -.3.1 (different machine, though).  Marland Kitchen PAF (paroxysmal atrial fibrillation) (HCC)    Dr. Martinique: Flecainide, metopr, apxiaban.  08/2017-->persistent a-fib w/RVR, flecainide increased and she converted back  to NSR and felt better.  100mg  bid flecainide continued.  . Peripheral neuropathy 2008   Idiopathic vs familial (motor>sensory) vs lumbar disc dz: gabapentin helpful  . Shoulder pain left   RC surg 09/2014    Past Surgical History:  Procedure Laterality Date  . BREAST BIOPSY  04/10/2017   Left breast core needle biopsy of calcifications.  COMPLEX SCLEROSING LESION WITH CALCIFICATIONS --no sign of malignancy.  Excision recommended--Cassie White has been referred to Dr. Donne Hazel, who recommended rpt mammo and  this was NORMAL 12/2017.  Marland Kitchen BREAST EXCISIONAL BIOPSY Right 2010   Benign  . BREAST SURGERY  Nov 2010   Benign biopsy, right  . CHOLECYSTECTOMY  03/2010   Dr.Gross  . COLONOSCOPY  10/03/2002   No polyps.  Repeat 10 yrs recommended but Cassie White declines.  Cassie White declined cologuard 08/2016 but changed her mind and this test was NEG on 03/23/17.  . COMBINED HYSTERECTOMY VAGINAL / OOPHORECTOMY / A&P REPAIR  5726   uncertain if ovaries were removed or not  . CYSTOCELE REPAIR    . Lumpectomy  06/2009   "Fatty Necrosis"  . PFT's  2014   NORMAL  . ROTATOR CUFF REPAIR Left 10/02/14   Dr. Veverly Fells  . TONSILLECTOMY    . TOTAL KNEE ARTHROPLASTY  09/2009   Right ;Dr Alvan Dame  . TOTAL KNEE ARTHROPLASTY Left 03/10/2016   Procedure: TOTAL KNEE ARTHROPLASTY;  Surgeon: Paralee Cancel, MD;  Location: [facility];  Service: Orthopedics;  Laterality: Left;  . TRANSTHORACIC ECHOCARDIOGRAM  09/2012; 09/27/15   2014: EF 60-65%, Gr 1 DD, mild AI, mild bileaflet MVP, mod post directed MR, mild LAE, PASP 35.  Repeat 2017: EF 55-60%, mild AR, mild MVP and mild MV regurg.  Marland Kitchen VAGINAL HYSTERECTOMY     For Uterine Deviation     Family History  Problem Relation Age of Onset  . Heart attack Father        in 52s  . Stroke Father 34  . Ovarian cancer Mother   . Heart attack Son 82       Smoker  . Coronary artery disease Brother   . COPD Brother   . Diabetes Neg Hx     SOCIAL HX:  Social History   Socioeconomic History  . Marital status: Widowed    Spouse name: Not on file  . Number of children: 3  . Years of education: HS grad  . Highest education level: Not on file  Occupational History  . 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 Financial resource strain: Not on file  . Food insecurity    Worry: Not on file    Inability: Not on file  . Transportation needs    Medical: Not on file    Non-medical: Not on file  Tobacco Use  . Smoking status: Never Smoker  . Smokeless tobacco: Never Used  Substance  and Sexual Activity  . Alcohol use: No    Alcohol/week: 0.0 standard drinks  . Drug use: No  . Sexual activity: Never    Birth control/protection: Post-menopausal  Lifestyle  . Physical activity    Days per week: Not on file    Minutes per session: Not on file  . Stress: Not on file  Relationships  . Social Herbalist on phone: Not on file    Gets together: Not on file    Attends religious service: Not on file    Active member of club or organization: Not on  file    Attends meetings of clubs or organizations: Not on file    Relationship status: Not on file  Other Topics Concern  . Not on file  Social History Narrative   Widow, has 3 children, 4 grandchildren.   Orig from [place].   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   Caffiene, 1 cup daily avg.   No tob, no alc, no drugs.   Exercise: walking, limited by bilat hip pain.      Current Outpatient Medications:  .  Biotin 5000 MCG CAPS, Take 5,000 mcg by mouth daily., Disp: , Rfl:  .  Calcium Carbonate (CALCIUM 600 PO), Take 600 mg by mouth 2 (two) times daily. , Disp: , Rfl:  .  cholecalciferol (VITAMIN D) 1000 UNITS tablet, Take 5,000 Units by mouth daily. , Disp: , Rfl:  .  cyanocobalamin (,VITAMIN B-12,) 1000 MCG/ML injection, INJECT 1 ML (1,000 MCG TOTAL) INTO THE MUSCLE EVERY 30 (THIRTY) DAYS., Disp: 10 mL, Rfl: 1 .  ELIQUIS 5 MG TABS tablet, TAKE 1 TABLET BY MOUTH TWICE A DAY, Disp: 60 tablet, Rfl: 6 .  flecainide (TAMBOCOR) 100 MG tablet, Take 1 tablet (100 mg total) by mouth 2 (two) times daily., Disp: 180 tablet, Rfl: 3 .  gabapentin (NEURONTIN) 300 MG capsule, TAKE 1 CAPSULE BY MOUTH AT LUNCH, 1 AT DINNER, AND 2 AT BEDTIME, Disp: 360 capsule, Rfl: 2 .  HYDROcodone-acetaminophen (NORCO/VICODIN) 5-325 MG tablet, Take 1 tablet by mouth every 8 (eight) hours., Disp: 6 tablet, Rfl: 0 .  metoprolol tartrate (LOPRESSOR) 25 MG tablet, TAKE 1 TABLET BY MOUTH TWICE A DAY, Disp: 180  tablet, Rfl: 1 .  NONFORMULARY OR COMPOUNDED ITEM, , Disp: , Rfl:  .  Omega-3 Fatty Acids (FISH OIL) 1200 MG CAPS, Take 1,000 mg by mouth 2 (two) times daily. , Disp: , Rfl:  .  pravastatin (PRAVACHOL) 40 MG tablet, Take 1 tablet (40 mg total) by mouth daily., Disp: 90 tablet, Rfl: 3 .  nitroGLYCERIN (NITROSTAT) 0.4 MG SL tablet, Place 1 tablet (0.4 mg total) under the tongue every 5 (five) minutes as needed for chest pain. (Patient not taking: Reported on 01/26/2019), Disp: 25 tablet, Rfl: 5  EXAM:  VITALS per patient if applicable: Ht 5\' 1"  (1.549 m)   Wt 146 lb (66.2 kg)   BMI 27.59 kg/m    GENERAL: alert, oriented, appears well and in no acute distress  HEENT: atraumatic, conjunttiva clear, no obvious abnormalities on inspection of external nose and ears  NECK: normal movements of the head and neck  LUNGS: on inspection no signs of respiratory distress, breathing rate appears normal, no obvious gross SOB, gasping or wheezing  CV: no obvious cyanosis  MS: moves all visible extremities without noticeable abnormality. With hands held outstretched in front of her she has a very minimal tremor in R arm/hand but none in left that I can appreciate on video screen.  No intention tremor.  No pill rolling tremor.  Facial expression range is normal. She can get up from couch with 1 push of both arms.  She walks slowly, slightly stooped forward, with short steps that are slightly of a "shuffling" nature.  Both legs with valgus deformity.  PSYCH/NEURO: pleasant and cooperative, no obvious depression or anxiety, speech and thought processing grossly intact  ASSESSMENT AND PLAN:  Discussed the following assessment and plan:  1) Tremor: not gabapentin related after all.  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 Will do  trial of primidone: start 25 qd and titrate up slowly to 50mg  bid dosing. I want to recheck her IN OFFICE in 6 wks to see how she's responding and to do a thorough  neuro exam. May eventually need neurologist referral (she states she does not want to see Dr. Brett Fairy, though).  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 I will keep her on gabapentin b/c she thinks it is helping adequately at current dosing, so when refill is needed she'll call my office to request.  I discussed the assessment and treatment plan with the patient. The patient was provided an opportunity to ask questions and all were answered. The patient agreed with the plan and demonstrated an understanding of the instructions.   The patient was advised to call back or seek an in-person evaluation if the symptoms worsen or if the condition fails to improve as anticipated.  F/u: 6 wks.  Signed:  Crissie Sickles, MD           01/26/2019

## 2019-01-28 ENCOUNTER — Telehealth: Payer: Self-pay

## 2019-01-28 NOTE — Telephone Encounter (Signed)
Received pt's pre-op risk evaluation from needing to be completed. Pt's operation is 03/11/19. Given to PCP for review.

## 2019-02-07 ENCOUNTER — Telehealth: Payer: Self-pay | Admitting: Cardiology

## 2019-02-07 NOTE — Telephone Encounter (Signed)
   [facility] Pre-operative Risk Assessment    Request for surgical clearance:  1. What type of surgery is being performed? Right reverse total shoulder   2. When is this surgery scheduled? TBD   3. What type of clearance is required (medical clearance vs. Pharmacy clearance to hold med vs. Both)? Both   4. Are there any medications that need to be held prior to surgery and how long? Eliquis   5. Practice name and name of physician performing surgery? Dr. Esmond Plants with [facility]   6. What is your office phone number [phone]    7.   What is your office fax number [phone] Attn: Adline Potter  8.   Anesthesia type (None, local, MAC, general) ? General   Request for recent note, testing, labs, EKG   Cassie White M 02/07/2019, 5:04 PM  _________________________________________________________________   (provider comments below)

## 2019-02-08 NOTE — Telephone Encounter (Signed)
Patient with diagnosis of afib on Eliquis for anticoagulation.    Procedure: Right reverse total shoulder  Date of procedure: 03/11/2019  CHADS2-VASc score of  4 (HTN, AGE, AGE, female)  CrCl 58ml/min  Per office protocol, patient can hold Eliquis for 2 days prior to procedure.

## 2019-02-08 NOTE — Telephone Encounter (Signed)
   Primary Cardiologist: Peter Martinique, MD  Chart reviewed as part of pre-operative protocol coverage. Patient was contacted 02/08/2019 in reference to pre-operative risk assessment for pending surgery as outlined below.  Cassie White was last seen on 10/01/2018 by Dr. Martinique.  Since that day, Cassie White has done well without any exertional chest pain or shortness of breath.  Therefore, based on ACC/AHA guidelines, the patient would be at acceptable risk for the planned procedure without further cardiovascular testing.   I will route this recommendation to the requesting party via Epic fax function and remove from pre-op pool.  Please call with questions. Patient has been informed to hold Eliquis for 2 days. We recommend she restart the Eliquis as soon as possible after the surgery at the discretion of the surgeon.  [place], [place] 02/08/2019, 11:30 AM

## 2019-02-12 ENCOUNTER — Other Ambulatory Visit: Payer: Self-pay | Admitting: Family Medicine

## 2019-02-12 DIAGNOSIS — I4891 Unspecified atrial fibrillation: Secondary | ICD-10-CM

## 2019-02-18 NOTE — Telephone Encounter (Signed)
Pt has an o/v already scheduled for 7/31 for 6 week follow up. Would the form be able to be filled out then?

## 2019-02-18 NOTE — Telephone Encounter (Signed)
Yes, that's fine 

## 2019-02-18 NOTE — Telephone Encounter (Signed)
Has pt been told that she needs an o/v with me before I can fill any pre-surgical risk form out?s

## 2019-02-24 NOTE — H&P (Signed)
Patient's anticipated LOS is less than 2 midnights, meeting these requirements: - Younger than 54 - Lives within 1 hour of care - Has a competent adult at home to recover with post-op recover - NO history of  - Chronic pain requiring opiods  - Diabetes  - Coronary Artery Disease  - Heart failure  - Heart attack  - Stroke  - DVT/VTE  - Cardiac arrhythmia  - Respiratory Failure/COPD  - Renal failure  - Anemia  - Advanced Liver disease       Cassie White is an 78 y.o. female.    Chief Complaint: right shoulder pain  HPI: Pt is a 78 y.o. female complaining of right shoulder pain for multiple years. Pain had continually increased since the beginning. X-rays in the clinic show end-stage arthritic changes of the right shoulder. Pt has tried various conservative treatments which have failed to alleviate their symptoms, including injections and therapy. Various options are discussed with the patient. Risks, benefits and expectations were discussed with the patient. Patient understand the risks, benefits and expectations and wishes to proceed with surgery.   PCP:  Tammi Sou, MD  D/C Plans: Home  PMH: Past Medical History:  Diagnosis Date  . Abnormal mammogram of left breast    Likely benign microcalcifications--repeat L diag mammo 03/24/2017.  COMPLEX SCLEROSING LESION WITH CALCIFICATIONS --no sign of malignancy.  Excision recommended--pt has been referred to Dr. Donne Hazel, who recommended f/u mammo and this was normal 12/2017--consider rpt 1 yr per rad.  . Arthritis    DJD, back and both hips  . Chronic low back pain without sciatica    Lumbar spondylosis + scoliosis.  Summer 2017 Dr. Nelva Bush did ESI and pt got no relief.  Pt then saw Dr. Maia Petties [facility]: felt she had facet mediated pain; plan for B L345 MBB (??).  Marland Kitchen Diverticulosis    on colonoscopy 2004  . GERD (gastroesophageal reflux disease)   . Hx of cardiovascular stress test    Lexiscan Myoview  (2/14):  Low risk, small ant defect likely breast attenuation, no ischemia, EF 69% (no change from 2010).    . Hyperlipidemia   . Hypertension    per pt  . Idiopathic scoliosis of thoracolumbar region   . Lumbar back pain   . Mitral regurgitation    Echo (2/14):  EF 60-65%, Gr 1 DD, mild AI, mild bileaflet MVP, mod post directed MR, mild LAE, PASP 35.  Marland Kitchen MVP (mitral valve prolapse)   . 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  Responding to gabapentin (Dr. Brett Fairy).  . Osteoporosis, senile    stable at the left hip 2014-2015 (T-score -2.7 when she stopped bisphosphonate).  Took fosamax for 10 yrs.  Repeat DEXA 69mo later (09/2016) T-score -.3.1 (different machine, though).  Marland Kitchen PAF (paroxysmal atrial fibrillation) (HCC)    Dr. Martinique: Flecainide, metopr, apxiaban.  08/2017-->persistent a-fib w/RVR, flecainide increased and she converted back to NSR and felt better.  100mg  bid flecainide continued.  . Peripheral neuropathy 2008   Idiopathic vs familial (motor>sensory) vs lumbar disc dz: gabapentin helpful  . Shoulder pain left   RC surg 09/2014    PSH: Past Surgical History:  Procedure Laterality Date  . BREAST BIOPSY  04/10/2017   Left breast core needle biopsy of calcifications.  COMPLEX SCLEROSING LESION WITH CALCIFICATIONS --no sign of malignancy.  Excision recommended--pt has been referred to Dr. Donne Hazel, who recommended rpt mammo and this was NORMAL 12/2017.  Marland Kitchen  BREAST EXCISIONAL BIOPSY Right 2010   Benign  . BREAST SURGERY  Nov 2010   Benign biopsy, right  . CHOLECYSTECTOMY  03/2010   Dr.Gross  . COLONOSCOPY  10/03/2002   No polyps.  Repeat 10 yrs recommended but pt declines.  Pt declined cologuard 08/2016 but changed her mind and this test was NEG on 03/23/17.  . COMBINED HYSTERECTOMY VAGINAL / OOPHORECTOMY / A&P REPAIR  6789   uncertain if ovaries were removed or not  . CYSTOCELE REPAIR    . Lumpectomy  06/2009   "Fatty  Necrosis"  . PFT's  2014   NORMAL  . ROTATOR CUFF REPAIR Left 10/02/14   Dr. Veverly Fells  . TONSILLECTOMY    . TOTAL KNEE ARTHROPLASTY  09/2009   Right ;Dr Alvan Dame  . TOTAL KNEE ARTHROPLASTY Left 03/10/2016   Procedure: TOTAL KNEE ARTHROPLASTY;  Surgeon: Paralee Cancel, MD;  Location: [facility];  Service: Orthopedics;  Laterality: Left;  . TRANSTHORACIC ECHOCARDIOGRAM  09/2012; 09/27/15   2014: EF 60-65%, Gr 1 DD, mild AI, mild bileaflet MVP, mod post directed MR, mild LAE, PASP 35.  Repeat 2017: EF 55-60%, mild AR, mild MVP and mild MV regurg.  Marland Kitchen VAGINAL HYSTERECTOMY     For Uterine Deviation     Social History:  reports that she has never smoked. She has never used smokeless tobacco. She reports that she does not drink alcohol or use drugs.  Allergies:  Allergies  Allergen Reactions  . Sulfa Antibiotics Swelling    face  . Tizanidine Other (See Comments)    delirium    Medications: No current facility-administered medications for this encounter.    Current Outpatient Medications  Medication Sig Dispense Refill  . Biotin 5000 MCG CAPS Take 5,000 mcg by mouth daily.    . Calcium Carbonate (CALCIUM 600 PO) Take 600 mg by mouth 2 (two) times daily.     . cholecalciferol (VITAMIN D) 1000 UNITS tablet Take 5,000 Units by mouth daily.     . cyanocobalamin (,VITAMIN B-12,) 1000 MCG/ML injection INJECT 1 ML (1,000 MCG TOTAL) INTO THE MUSCLE EVERY 30 (THIRTY) DAYS. 10 mL 1  . ELIQUIS 5 MG TABS tablet TAKE 1 TABLET BY MOUTH TWICE A DAY 60 tablet 6  . flecainide (TAMBOCOR) 100 MG tablet Take 1 tablet (100 mg total) by mouth 2 (two) times daily. 180 tablet 3  . gabapentin (NEURONTIN) 300 MG capsule TAKE 1 CAPSULE BY MOUTH AT LUNCH, 1 AT DINNER, AND 2 AT BEDTIME 360 capsule 2  . HYDROcodone-acetaminophen (NORCO/VICODIN) 5-325 MG tablet Take 1 tablet by mouth every 8 (eight) hours. 6 tablet 0  . metoprolol tartrate (LOPRESSOR) 25 MG tablet TAKE 1 TABLET BY MOUTH TWICE A DAY 180 tablet 1  . nitroGLYCERIN  (NITROSTAT) 0.4 MG SL tablet Place 1 tablet (0.4 mg total) under the tongue every 5 (five) minutes as needed for chest pain. (Patient not taking: Reported on 01/26/2019) 25 tablet 5  . NONFORMULARY OR COMPOUNDED ITEM     . Omega-3 Fatty Acids (FISH OIL) 1200 MG CAPS Take 1,000 mg by mouth 2 (two) times daily.     . pravastatin (PRAVACHOL) 40 MG tablet Take 1 tablet (40 mg total) by mouth daily. 90 tablet 3  . primidone (MYSOLINE) 50 MG tablet 1/2 tab po qd x 6d, then 1 tab po qd x 6d, then 1 tab twice daily 45 tablet 0  . primidone (MYSOLINE) 50 MG tablet Take 1 tablet (50 mg total) by mouth 2 (two) times  a day. 60 tablet 0    No results found for this or any previous visit (from the past 48 hour(s)). No results found.  ROS: Pain with rom of the right upper extremity  Physical Exam: Alert and oriented 78 y.o. female in no acute distress Cranial nerves 2-12 intact Cervical spine: full rom with no tenderness, nv intact distally Chest: active breath sounds bilaterally, no wheeze rhonchi or rales Heart: regular rate and rhythm, no murmur Abd: non tender non distended with active bowel sounds Hip is stable with rom  Right shoulder with limited rom and strength  nv intact distally No rashes or edema  Assessment/Plan Assessment: right shoulder cuff arthropathy  Plan:  Patient will undergo a right reverse total shoulder by Dr. Veverly Fells at [facility]. Risks benefits and expectations were discussed with the patient. Patient understand risks, benefits and expectations and wishes to proceed. Preoperative templating of the joint replacement has been completed, documented, and submitted to the Operating Room personnel in order to optimize intra-operative equipment management.   Merla Riches PA-C, MPAS [facility] is now [facility] [address] [place], [place], [place] [address] Phone: [phone] [email or web address] Facebook  [employer]

## 2019-03-04 ENCOUNTER — Ambulatory Visit (INDEPENDENT_AMBULATORY_CARE_PROVIDER_SITE_OTHER): Payer: Medicare Other | Admitting: Family Medicine

## 2019-03-04 ENCOUNTER — Other Ambulatory Visit: Payer: Self-pay | Admitting: Family Medicine

## 2019-03-04 ENCOUNTER — Other Ambulatory Visit: Payer: Self-pay

## 2019-03-04 ENCOUNTER — Encounter: Payer: Self-pay | Admitting: Family Medicine

## 2019-03-04 VITALS — BP 109/59 | HR 52 | Temp 98.2°F | Resp 16 | Ht 61.0 in | Wt 150.0 lb

## 2019-03-04 DIAGNOSIS — G250 Essential tremor: Secondary | ICD-10-CM | POA: Diagnosis not present

## 2019-03-04 DIAGNOSIS — Z01818 Encounter for other preprocedural examination: Secondary | ICD-10-CM

## 2019-03-04 NOTE — Progress Notes (Signed)
Office Note 03/04/2019  CC:  Chief Complaint  Patient presents with  . Follow-up    chronic med problems    HPI:  Cassie White is a 78 y.o. White female who is here accompanied by her daughter for follow up tremor and also needs pre-surgical clearance for right reverse total shoulder arthroplasty (sched for 03/11/19) with Dr. Veverly Fells. General anesthesia will be used. She has chronic a-fib, takes flecainide, metoprolol, and eliquis.  Hx of mod MR on echo 2014, mild MR on 2017 echo.   Is followed by Dr. Martinique.  Most recent cardiology f/u 09/2018.  She has been cleared by cardiology as of 02/08/19---per EMR notes review today.  Last visit I started her on a trial of primidone for essential tremor->plans for titration to 50mg  bid dosing. 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 No SE's from primidone standpoint except some initial dizziness.  She is at 50 mg bid dosing. Says it is not helping much at all.  No new complaints. Having mucous in back of throat and esoph lately, taking mucinex helpful. No cough or fever.  No SOB or CP. Mild nasal congestion with PND.  Uses some nasal spray daily.   ROS: no CP, no SOB, no wheezing, no cough, no dizziness, no HAs, no rashes, no melena/hematochezia.  No polyuria or polydipsia.  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    Past Medical History:  Diagnosis Date  . Abnormal mammogram of left breast    Likely benign microcalcifications--repeat L diag mammo 03/24/2017.  COMPLEX SCLEROSING LESION WITH CALCIFICATIONS --no sign of malignancy.  Excision recommended--pt has been referred to Dr. Donne Hazel, who recommended f/u mammo and this was normal 12/2017--consider rpt 1 yr per rad.  . Arthritis    DJD, back and both hips  . Chronic low back pain without sciatica    Lumbar spondylosis + scoliosis.  Summer 2017 Dr. Nelva Bush did ESI and pt got no relief.  Pt then saw Dr. Maia Petties [facility]: felt  she had facet mediated pain; plan for B L345 MBB (??).  Marland Kitchen Diverticulosis    on colonoscopy 2004  . GERD (gastroesophageal reflux disease)   . Hx of cardiovascular stress test    Lexiscan Myoview (2/14):  Low risk, small ant defect likely breast attenuation, no ischemia, EF 69% (no change from 2010).    . Hyperlipidemia   . Hypertension    per pt  . Idiopathic scoliosis of thoracolumbar region   . Lumbar back pain   . Mitral regurgitation    Echo (2/14):  EF 60-65%, Gr 1 DD, mild AI, mild bileaflet MVP, mod post directed MR, mild LAE, PASP 35.  Marland Kitchen MVP (mitral valve prolapse)   . 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  Responding to gabapentin (Dr. Brett Fairy).  . Osteoporosis, senile    stable at the left hip 2014-2015 (T-score -2.7 when she stopped bisphosphonate).  Took fosamax for 10 yrs.  Repeat DEXA 28mo later (09/2016) T-score -.3.1 (different machine, though).  Marland Kitchen PAF (paroxysmal atrial fibrillation) (HCC)    Dr. Martinique: Flecainide, metopr, apxiaban.  08/2017-->persistent a-fib w/RVR, flecainide increased and she converted back to NSR and felt better.  100mg  bid flecainide continued.  . Peripheral neuropathy 2008   Idiopathic vs familial (motor>sensory) vs lumbar disc dz: gabapentin helpful  . Shoulder pain left   RC surg 09/2014    Past Surgical History:  Procedure Laterality Date  .  BREAST BIOPSY  04/10/2017   Left breast core needle biopsy of calcifications.  COMPLEX SCLEROSING LESION WITH CALCIFICATIONS --no sign of malignancy.  Excision recommended--pt has been referred to Dr. Donne Hazel, who recommended rpt mammo and this was NORMAL 12/2017.  Marland Kitchen BREAST EXCISIONAL BIOPSY Right 2010   Benign  . BREAST SURGERY  Nov 2010   Benign biopsy, right  . CHOLECYSTECTOMY  03/2010   Dr.Gross  . COLONOSCOPY  10/03/2002   No polyps.  Repeat 10 yrs recommended but pt declines.  Pt declined cologuard 08/2016 but changed her mind and this test  was NEG on 03/23/17.  . COMBINED HYSTERECTOMY VAGINAL / OOPHORECTOMY / A&P REPAIR  6144   uncertain if ovaries were removed or not  . CYSTOCELE REPAIR    . Lumpectomy  06/2009   "Fatty Necrosis"  . PFT's  2014   NORMAL  . ROTATOR CUFF REPAIR Left 10/02/14   Dr. Veverly Fells  . TONSILLECTOMY    . TOTAL KNEE ARTHROPLASTY  09/2009   Right ;Dr Alvan Dame  . TOTAL KNEE ARTHROPLASTY Left 03/10/2016   Procedure: TOTAL KNEE ARTHROPLASTY;  Surgeon: Paralee Cancel, MD;  Location: [facility];  Service: Orthopedics;  Laterality: Left;  . TRANSTHORACIC ECHOCARDIOGRAM  09/2012; 09/27/15   2014: EF 60-65%, Gr 1 DD, mild AI, mild bileaflet MVP, mod post directed MR, mild LAE, PASP 35.  Repeat 2017: EF 55-60%, mild AR, mild MVP and mild MV regurg.  Marland Kitchen VAGINAL HYSTERECTOMY     For Uterine Deviation     Family History  Problem Relation Age of Onset  . Heart attack Father        in 64s  . Stroke Father 12  . Ovarian cancer Mother   . Heart attack Son 17       Smoker  . Coronary artery disease Brother   . COPD Brother   . Diabetes Neg Hx     Social History   Socioeconomic History  . Marital status: Widowed    Spouse name: Not on file  . Number of children: 3  . Years of education: HS grad  . Highest education level: Not on file  Occupational History  . 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 Financial resource strain: Not on file  . Food insecurity    Worry: Not on file    Inability: Not on file  . Transportation needs    Medical: Not on file    Non-medical: Not on file  Tobacco Use  . Smoking status: Never Smoker  . Smokeless tobacco: Never Used  Substance and Sexual Activity  . Alcohol use: No    Alcohol/week: 0.0 standard drinks  . Drug use: No  . Sexual activity: Never    Birth control/protection: Post-menopausal  Lifestyle  . Physical activity    Days per week: Not on file    Minutes per session: Not on file  . Stress: Not on file  Relationships  . Social Product manager on phone: Not on file    Gets together: Not on file    Attends religious service: Not on file    Active member of club or organization: Not on file    Attends meetings of clubs or organizations: Not on file    Relationship status: Not on file  . Intimate partner violence    Fear of current or ex partner: Not on file    Emotionally abused: Not on file    Physically abused: Not  on file    Forced sexual activity: Not on file  Other Topics Concern  . Not on file  Social History Narrative   Widow, has 3 children, 4 grandchildren.   Orig from [place].   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   Caffiene, 1 cup daily avg.   No tob, no alc, no drugs.   Exercise: walking, limited by bilat hip pain.    Outpatient Medications Prior to Visit  Medication Sig Dispense Refill  . Biotin 5000 MCG CAPS Take 5,000 mcg by mouth daily.    . Calcium Carbonate (CALCIUM 600 PO) Take 600 mg by mouth 2 (two) times daily.     . cholecalciferol (VITAMIN D) 1000 UNITS tablet Take 5,000 Units by mouth daily.     . cyanocobalamin (,VITAMIN B-12,) 1000 MCG/ML injection INJECT 1 ML (1,000 MCG TOTAL) INTO THE MUSCLE EVERY 30 (THIRTY) DAYS. 10 mL 1  . ELIQUIS 5 MG TABS tablet TAKE 1 TABLET BY MOUTH TWICE A DAY 60 tablet 6  . Fexofenadine HCl (MUCINEX ALLERGY PO) Take by mouth.    . flecainide (TAMBOCOR) 100 MG tablet Take 1 tablet (100 mg total) by mouth 2 (two) times daily. 180 tablet 3  . gabapentin (NEURONTIN) 300 MG capsule TAKE 1 CAPSULE BY MOUTH AT LUNCH, 1 AT DINNER, AND 2 AT BEDTIME 360 capsule 2  . HYDROcodone-acetaminophen (NORCO/VICODIN) 5-325 MG tablet Take 1 tablet by mouth every 8 (eight) hours. 6 tablet 0  . metoprolol tartrate (LOPRESSOR) 25 MG tablet TAKE 1 TABLET BY MOUTH TWICE A DAY 180 tablet 1  . Omega-3 Fatty Acids (FISH OIL) 1200 MG CAPS Take 1,000 mg by mouth 2 (two) times daily.     . pravastatin (PRAVACHOL) 40 MG tablet Take 1 tablet (40 mg total)  by mouth daily. 90 tablet 3  . primidone (MYSOLINE) 50 MG tablet 1/2 tab po qd x 6d, then 1 tab po qd x 6d, then 1 tab twice daily 45 tablet 0  . nitroGLYCERIN (NITROSTAT) 0.4 MG SL tablet Place 1 tablet (0.4 mg total) under the tongue every 5 (five) minutes as needed for chest pain. (Patient not taking: Reported on 01/26/2019) 25 tablet 5  . NONFORMULARY OR COMPOUNDED ITEM     . primidone (MYSOLINE) 50 MG tablet Take 1 tablet (50 mg total) by mouth 2 (two) times a day. (Patient not taking: Reported on 03/04/2019) 60 tablet 0   No facility-administered medications prior to visit.     Allergies  Allergen Reactions  . Sulfa Antibiotics Swelling    face  . Tizanidine Other (See Comments)    delirium    PE; Blood pressure (!) 109/59, pulse (!) 52, temperature 98.2 F (36.8 C), temperature source Temporal, resp. rate 16, height 5\' 1"  (1.549 m), weight 150 lb (68 kg), SpO2 96 %. Body mass index is 28.34 kg/m.  Gen: Alert, well appearing.  Patient is oriented to person, place, time, and situation. AFFECT: pleasant, lucid thought and speech. ASN:KNLZ: no injection, icteris, swelling, or exudate.  EOMI, PERRLA. Mouth: lips without lesion/swelling.  Oral mucosa pink and moist. Oropharynx without erythema, exudate, or swelling.  Neck - No masses or thyromegaly or limitation in range of motion.  Carotids 2+ bilat, w/out bruit. CV: RRR with 1/6 systolic murmur.  No diastolic murmur, no rub or gallop.  Occasional ectopy. Chest is clear, no wheezing or rales. Normal symmetric air entry throughout both lung fields. No chest wall deformities or tenderness. EXT: no  clubbing or cyanosis.  no edema.  Neuro: GAIT->walks stooped over, slow but without shuffling steps. Very mild tremor of both hands when she holds arms outstretched in front of her.    Pertinent labs:  Lab Results  Component Value Date   TSH 1.63 09/24/2015   Lab Results  Component Value Date   WBC 6.1 06/02/2018   HGB 13.3  06/02/2018   HCT 39.7 06/02/2018   MCV 92 06/02/2018   PLT 172 06/02/2018   Lab Results  Component Value Date   CREATININE 0.99 06/02/2018   BUN 18 06/02/2018   NA 145 (H) 06/02/2018   K 5.1 06/02/2018   CL 107 (H) 06/02/2018   CO2 26 06/02/2018   Lab Results  Component Value Date   ALT 14 09/04/2017   AST 16 09/04/2017   ALKPHOS 64 09/04/2017   BILITOT 0.8 09/04/2017   Lab Results  Component Value Date   CHOL 186 03/26/2018   Lab Results  Component Value Date   HDL 75.90 03/26/2018   Lab Results  Component Value Date   LDLCALC 83 03/26/2018   Lab Results  Component Value Date   TRIG 133.0 03/26/2018   Lab Results  Component Value Date   CHOLHDL 2 03/26/2018   Lab Results  Component Value Date   HGBA1C  09/19/2010    5.3 (NOTE)                                                                       According to the ADA Clinical Practice Recommendations for 2011, when HbA1c is used as a screening test:   >=6.5%   Diagnostic of Diabetes Mellitus           (if abnormal result  is confirmed)  5.7-6.4%   Increased risk of developing Diabetes Mellitus  References:Diagnosis and Classification of Diabetes Mellitus,Diabetes RAXE,9407,68(GSUPJ 1):S62-S69 and Standards of Medical Care in         Diabetes - 2011,Diabetes Care,2011,34  (Suppl 1):S11-S61.   ASSESSMENT AND PLAN:   1) Preoperative clearance for right reverse total shoulder arthroplasty. Cardiology has cleared her. Her medical issues are maximally managed/controlled.  Filled out and signed clearance form today, faxed to her orthopedist. No labs needed today.  She will be getting pre-op labs next week by ortho. She'll stop eliquis 24 prior to surgery.  2) Tremor: suspect essential tremor.   Unchanged with trial of CESSATION of gabapentin. No change with primidone the last 5 wks. Continue primidone 50mg  bid at this time. At f/u in 2 mo will push dose up to 100 mg bid and see if she tolerates this w/out any  dizziness. Next option is neuro referral.  An After Visit Summary was printed and given to the patient.  FOLLOW UP:  Return in about 2 months (around 05/04/2019) for f/u tremor.  Signed:  Crissie Sickles, MD           03/04/2019

## 2019-03-04 NOTE — Telephone Encounter (Signed)
Pt was seen today for follow up and stated med was not helping much or at all.  RF request for Primidone LOV: 03/04/19 Next ov: advised to f/u 2 months Last written: 01/26/19(45,0)  Appropriate for refill? Please advise, thanks.

## 2019-03-08 ENCOUNTER — Other Ambulatory Visit (HOSPITAL_COMMUNITY)
Admission: RE | Admit: 2019-03-08 | Discharge: 2019-03-08 | Disposition: A | Discharge status: Home / Self Care | Payer: Medicare Other | Source: Ambulatory Visit | Attending: Orthopedic Surgery | Admitting: Orthopedic Surgery

## 2019-03-08 DIAGNOSIS — Z01812 Encounter for preprocedural laboratory examination: Secondary | ICD-10-CM | POA: Insufficient documentation

## 2019-03-08 DIAGNOSIS — Z20828 Contact with and (suspected) exposure to other viral communicable diseases: Secondary | ICD-10-CM | POA: Insufficient documentation

## 2019-03-08 LAB — SARS CORONAVIRUS 2 (TAT 6-24 HRS): SARS Coronavirus 2: NEGATIVE

## 2019-03-08 NOTE — Patient Instructions (Signed)
YOU NEED TO HAVE A COVID 19 TEST ON_8/4______ @ 1:05 PM_______,  THIS TEST MUST BE DONE BEFORE SURGERY, COME  Cassie White, [address] [place] , [address]. ONCE YOUR COVID TEST IS COMPLETED,  PLEASE BEGIN THE QUARANTINE INSTRUCTIONS AS OUTLINED IN YOUR HANDOUT.                Cassie White   Your procedure is scheduled on: Friday 03/11/19   Report to [facility]  Entrance Report to Short Stay at 5:30 AM. No family in Short Stay at this time   1 VISITOR IS ALLOWED TO WAIT IN [place].    Call this number if you have problems the morning of surgery [phone]      Do not eat food After Midnight . YOU MAY HAVE CLEAR LIQUIDS FROM MIDNIGHT UNTIL 4:30 AM.  At 4:30 AM Please finish the prescribed Pre-Surgery  drink.  Nothing by mouth after you finish the  drink !   BRUSH YOUR TEETH MORNING OF SURGERY AND RINSE YOUR MOUTH OUT, NO CHEWING GUM CANDY OR MINTS.     Take these medicines the morning of surgery with A SIP OF WATER: Flecainide, Primadome,Metoprolol                                 You may not have any metal on your body including hair pins and              piercings              Do not wear jewelry, make-up, lotions, powders or perfumes, deodorant             Do not wear nail polish.  Do not shave  48 hours prior to surgery.        Do not bring valuables to the hospital. Cassie White.  Contacts, dentures or bridgework may not be worn into surgery.    Special Instructions: N/A  [place] - Preparing for Surgery Before surgery, you can play an important role .  Because skin is not sterile, your skin needs to be as free of germs as possible.   You can reduce the number of germs on your skin by washing with CHG (chlorahexidine gluconate) soap before surgery .  CHG is an antiseptic cleaner which kills germs and bonds with the skin to continue killing germs even after  washing. Please DO NOT use if you have an allergy to CHG or antibacterial soaps.   If your skin becomes reddened/irritated stop using the CHG and inform your nurse when you arrive at Short Stay. Do not shave (including legs and underarms) for at least 48 hours prior to the first CHG showerPlease follow these instructions carefully:  1.  Shower with CHG Soap the night before surgery and the  morning of Surgery.  2.  If you choose to wash your hair, wash your hair first as usual with your  normal  shampoo.  3.  After you shampoo, rinse your hair and body thoroughly to remove the  shampoo.  4.  Use CHG as you would any other liquid soap.  You can apply chg directly  to the skin and wash                       Gently with a scrungie or clean washcloth.  5.  Apply the CHG Soap to your body ONLY FROM THE NECK DOWN.   Do not use on face/ open                           Wound or open sores. Avoid contact with eyes, ears mouth and genitals (private parts).                       Wash face,  Genitals (private parts) with your normal soap.             6.  Wash thoroughly, paying special attention to the area where your surgery  will be performed.  7.  Thoroughly rinse your body with warm water from the neck down.  8.  DO NOT shower/wash with your normal soap after using and rinsing off  the CHG Soap.                9.  Pat yourself dry with a clean towel.            10.  Wear clean pajamas.            11.  Place clean sheets on your bed the night of your first shower and do not  sleep with pets. Day of Surgery : Do not apply any lotions/deodorants the morning of surgery.  Please wear clean clothes to the hospital/surgery center.  FAILURE TO FOLLOW THESE INSTRUCTIONS MAY RESULT IN THE CANCELLATION OF YOUR SURGERY PATIENT SIGNATURE_________________________________  NURSE  SIGNATURE__________________________________  ________________________________________________________________________   Cassie White  An incentive spirometer is a tool that can help keep your lungs clear and active. This tool measures how well you are filling your lungs with each breath. Taking long deep breaths may help reverse or decrease the chance of developing breathing (pulmonary) problems (especially infection) following:  A long period of time when you are unable to move or be active. BEFORE THE PROCEDURE   If the spirometer includes an indicator to show your best effort, your nurse or respiratory therapist will set it to a desired goal.  If possible, sit up straight or lean slightly forward. Try not to slouch.  Hold the incentive spirometer in an upright position. INSTRUCTIONS FOR USE  1. Sit on the edge of your bed if possible, or sit up as far as you can in bed or on a chair. 2. Hold the incentive spirometer in an upright position. 3. Breathe out normally. 4. Place the mouthpiece in your mouth and seal your lips tightly around it. 5. Breathe in slowly and as deeply as possible, raising the piston or the ball toward the top of the column. 6. Hold your breath for 3-5 seconds or for as long as possible. Allow the piston or ball to fall to the bottom of the column. 7. Remove the mouthpiece from your mouth and breathe out normally. 8. Rest for a few seconds and repeat Steps 1 through 7 at least 10 times every 1-2 hours when you are awake. Take your time and take a few normal breaths between deep breaths. 9. The spirometer may include an indicator to show  your best effort. Use the indicator as a goal to work toward during each repetition. 10. After each set of 10 deep breaths, practice coughing to be sure your lungs are clear. If you have an incision (the cut made at the time of surgery), support your incision when coughing by placing a pillow or rolled up towels firmly  against it. Once you are able to get out of bed, walk around indoors and cough well. You may stop using the incentive spirometer when instructed by your caregiver.  RISKS AND COMPLICATIONS  Take your time so you do not get dizzy or light-headed.  If you are in pain, you may need to take or ask for pain medication before doing incentive spirometry. It is harder to take a deep breath if you are having pain. AFTER USE  Rest and breathe slowly and easily.  It can be helpful to keep track of a log of your progress. Your caregiver can provide you with a simple table to help with this. If you are using the spirometer at home, follow these instructions: [place] IF:   You are having difficultly using the spirometer.  You have trouble using the spirometer as often as instructed.  Your pain medication is not giving enough relief while using the spirometer.  You develop fever of 100.5 F (38.1 C) or higher. SEEK IMMEDIATE MEDICAL CARE IF:   You cough up bloody sputum that had not been present before.  You develop fever of 102 F (38.9 C) or greater.  You develop worsening pain at or near the incision site. MAKE SURE YOU:   Understand these instructions.  Will watch your condition.  Will get help right away if you are not doing well or get worse. Document Released: 12/01/2006 Document Revised: 10/13/2011 Document Reviewed: 02/01/2007 [facility] Patient Information 2014 [place], [place].   ________________________________________________________________________

## 2019-03-09 ENCOUNTER — Encounter (HOSPITAL_COMMUNITY)
Admission: RE | Admit: 2019-03-09 | Discharge: 2019-03-09 | Disposition: A | Discharge status: Home / Self Care | Payer: Medicare Other | Source: Ambulatory Visit | Attending: Orthopedic Surgery | Admitting: Orthopedic Surgery

## 2019-03-09 ENCOUNTER — Other Ambulatory Visit: Payer: Self-pay

## 2019-03-09 ENCOUNTER — Encounter (HOSPITAL_COMMUNITY): Payer: Self-pay

## 2019-03-09 LAB — CBC
HCT: 44.6 % (ref 36.0–46.0)
Hemoglobin: 14.1 g/dL (ref 12.0–15.0)
MCH: 30.7 pg (ref 26.0–34.0)
MCHC: 31.6 g/dL (ref 30.0–36.0)
MCV: 97 fL (ref 80.0–100.0)
Platelets: 166 K/uL (ref 150–400)
RBC: 4.6 MIL/uL (ref 3.87–5.11)
RDW: 13.3 % (ref 11.5–15.5)
WBC: 4.9 K/uL (ref 4.0–10.5)
nRBC: 0 % (ref 0.0–0.2)

## 2019-03-09 LAB — BASIC METABOLIC PANEL
Anion gap: 7 (ref 5–15)
BUN: 15 mg/dL (ref 8–23)
CO2: 26 mmol/L (ref 22–32)
Calcium: 9.7 mg/dL (ref 8.9–10.3)
Chloride: 106 mmol/L (ref 98–111)
Creatinine, Ser: 0.85 mg/dL (ref 0.44–1.00)
GFR calc Af Amer: >60 mL/min (ref >60)
GFR calc non Af Amer: >60 mL/min (ref >60)
Glucose, Bld: 96 mg/dL (ref 70–99)
Potassium: 4.2 mmol/L (ref 3.5–5.1)
Sodium: 139 mmol/L (ref 135–145)

## 2019-03-09 LAB — SURGICAL PCR SCREEN
MRSA, PCR: NEGATIVE
Staphylococcus aureus: NEGATIVE

## 2019-03-09 NOTE — Progress Notes (Signed)
Eliquis was stopped 8/6 per Dr. Anitra Lauth Labs and EKG are in Epic

## 2019-03-10 NOTE — Progress Notes (Signed)
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 never smoker with h/o GERD, MVP, mild mitral regurgitation, PAF (on Eliquis), HLD, HTN, right shoulder OA scheduled for above procedure 03/11/2019 with Dr. Netta Cedars.   Pt seen by PCP, Dr. Anitra Lauth, 03/04/2019 for preoperative evaluation.  Per OV note, "Cardiolgy has cleared her.  Her medical issues are maximally managed/controlled.  Filled out and signed clearance form today, faxed to her orthopedist.  No labs needed today.  She will be getting pre-op labs next week by ortho.  She'll stop eliquis 24 hours to surgery."  Per Almyra Deforest, PA-C with cardiology on 02/08/2019, "Cassie White was last seen on 10/01/2018 by Dr. Martinique.  Since that day, Cassie White has done well without any exertional chest pain or shortness of breath.  Therefore, based on ACC/AHA guidelines, the patient would be at acceptable risk for the planned procedure without further cardiovascular testing.  Please call with questions. Patient has been informed to hold Eliquis for 2 days. We recommend she restart the Eliquis as soon as possible after the surgery at the discretion of the surgeon."  Anticipate pt can proceed with planned procedure barring acute status change.   VS: BP (!) 132/49 (BP Location: Left Arm)   Pulse (!) 52   Temp 36.6 C (Oral)   Resp 16   Ht 5\' 1"  (1.549 m)   Wt 68.1 kg   SpO2 97%   BMI 28.35 kg/m   PROVIDERS: [name], Adrian Blackwater, MD is PCP   Martinique, Peter, MD is Cardiologist  LABS: Labs reviewed: Acceptable for surgery. (all labs ordered are listed, but only abnormal results are displayed)  Labs Reviewed  SURGICAL PCR SCREEN  BASIC METABOLIC PANEL  CBC      IMAGES:   EKG: 06/10/2018 Rate 53 bpm Sinus bradycardia with 1st degree AV block  Incomplete left bundle branch block   CV: Myocardial Perfusion 10/18/15  The left ventricular ejection fraction is moderately decreased (30-44%).  Nuclear stress EF: 64%.  There was no ST segment deviation noted during stress.  No T wave inversion was noted during stress.  Defect 1: There is a medium defect of moderate severity.  This is an intermediate risk study.  Findings consistent with prior myocardial infarction with peri-infarct ischemia.   Moderate size and intensity partially reversible anterior and apical wall defect (SDS 4). LVEF 64% with normal wall motion. This likely represents shifting breast artifact given normal wall motion and LV function. Clinical correlation is advised. This is an intermediate risk study.  Echo 09/27/2015 Study Conclusions  - Left ventricle: The cavity size was normal. There was mild   concentric hypertrophy. Systolic function was normal. The   estimated ejection fraction was in the range of 55% to 60%. - Aortic valve: There was mild regurgitation. - Mitral valve: Mild anterior leafelt prolapse. There was mild   regurgitation. - Atrial septum: No defect or patent foramen ovale was identified. - Pulmonary arteries: PA peak pressure: 36 mm Hg (S). Past Medical History:  Diagnosis Date  . Abnormal mammogram of left breast    Likely benign microcalcifications--repeat L diag mammo 03/24/2017.  COMPLEX SCLEROSING LESION WITH CALCIFICATIONS --no sign of malignancy.  Excision  recommended--pt has been referred to Dr. Donne Hazel, who recommended f/u mammo and this was normal 12/2017--consider rpt 1 yr per rad.  . Arthritis    DJD, back and both hips  . Chronic low back pain without sciatica    Lumbar spondylosis + scoliosis.  Summer 2017 Dr. Nelva Bush did ESI and pt got no relief.  Pt then saw Dr. Maia Petties [facility]: felt she had facet mediated pain;  plan for B L345 MBB (??).  Marland Kitchen Diverticulosis    on colonoscopy 2004  . GERD (gastroesophageal reflux disease)   . Hx of cardiovascular stress test    Lexiscan Myoview (2/14):  Low risk, small ant defect likely breast attenuation, no ischemia, EF 69% (no change from 2010).    . Hyperlipidemia   . Hypertension    per pt  . Idiopathic scoliosis of thoracolumbar region   . Lumbar back pain   . Mitral regurgitation    Echo (2/14):  EF 60-65%, Gr 1 DD, mild AI, mild bileaflet MVP, mod post directed MR, mild LAE, PASP 35.  Marland Kitchen MVP (mitral valve prolapse)   . 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  Responding to gabapentin (Dr. Brett Fairy).  . Osteoporosis, senile    stable at the left hip 2014-2015 (T-score -2.7 when she stopped bisphosphonate).  Took fosamax for 10 yrs.  Repeat DEXA 68mo later (09/2016) T-score -.3.1 (different machine, though).  Marland Kitchen PAF (paroxysmal atrial fibrillation) (HCC)    Dr. Martinique: Flecainide, metopr, apxiaban.  08/2017-->persistent a-fib w/RVR, flecainide increased and she converted back to NSR and felt better.  100mg  bid flecainide continued.  . Peripheral neuropathy 2008   Idiopathic vs familial (motor>sensory) vs lumbar disc dz: gabapentin helpful  . Shoulder pain left   RC surg 09/2014    Past Surgical History:  Procedure Laterality Date  . BREAST BIOPSY  04/10/2017   Left breast core needle biopsy of calcifications.  COMPLEX SCLEROSING LESION WITH CALCIFICATIONS --no sign of malignancy.  Excision recommended--pt has been referred to Dr. Donne Hazel, who recommended rpt mammo and this was NORMAL 12/2017.  Marland Kitchen BREAST EXCISIONAL BIOPSY Right 2010   Benign  . BREAST SURGERY  Nov 2010   Benign biopsy, right  . CHOLECYSTECTOMY  03/2010   Dr.Gross  . COLONOSCOPY  10/03/2002   No polyps.  Repeat 10 yrs recommended but pt declines.  Pt declined cologuard 08/2016 but changed her mind and this test was NEG on 03/23/17.  .  COMBINED HYSTERECTOMY VAGINAL / OOPHORECTOMY / A&P REPAIR  7425   uncertain if ovaries were removed or not  . CYSTOCELE REPAIR    . Lumpectomy  06/2009   "Fatty Necrosis"  . PFT's  2014   NORMAL  . ROTATOR CUFF REPAIR Left 10/02/14   Dr. Veverly Fells  . TONSILLECTOMY    . TOTAL KNEE ARTHROPLASTY  09/2009   Right ;Dr Alvan Dame  . TOTAL KNEE ARTHROPLASTY Left 03/10/2016   Procedure: TOTAL KNEE ARTHROPLASTY;  Surgeon: Paralee Cancel, MD;  Location: [facility];  Service: Orthopedics;  Laterality: Left;  . TRANSTHORACIC ECHOCARDIOGRAM  09/2012; 09/27/15   2014: EF 60-65%, Gr 1 DD, mild AI, mild bileaflet MVP, mod post directed MR, mild LAE, PASP 35.  Repeat 2017: EF 55-60%, mild AR, mild MVP and mild MV regurg.  Marland Kitchen VAGINAL HYSTERECTOMY     For Uterine Deviation     MEDICATIONS: . Biotin 5000 MCG CAPS  . Calcium Carbonate (CALCIUM 600 PO)  . cyanocobalamin (,VITAMIN B-12,) 1000  MCG/ML injection  . ELIQUIS 5 MG TABS tablet  . flecainide (TAMBOCOR) 100 MG tablet  . gabapentin (NEURONTIN) 300 MG capsule  . HYDROcodone-acetaminophen (NORCO/VICODIN) 5-325 MG tablet  . metoprolol tartrate (LOPRESSOR) 25 MG tablet  . nitroGLYCERIN (NITROSTAT) 0.4 MG SL tablet  . Omega-3 Fatty Acids (FISH OIL) 1200 MG CAPS  . pravastatin (PRAVACHOL) 40 MG tablet  . primidone (MYSOLINE) 50 MG tablet  . primidone (MYSOLINE) 50 MG tablet   No current facility-administered medications for this encounter.      Maia Plan [facility] Testing [phone] 03/10/19  11:20 AM

## 2019-03-10 NOTE — Anesthesia Preprocedure Evaluation (Addendum)
Anesthesia Evaluation  Patient identified by MRN, date of birth, ID band Patient awake    Reviewed: Allergy & Precautions, NPO status , Patient's Chart, lab work & pertinent test results  History of Anesthesia Complications Negative for: history of anesthetic complications  Airway Mallampati: II  TM Distance: >3 FB Neck ROM: Full    Dental  (+) Dental Advisory Given, Teeth Intact   Pulmonary neg pulmonary ROS,    Pulmonary exam normal        Cardiovascular hypertension, Pt. on medications and Pt. on home beta blockers (-) anginaNormal cardiovascular exam+ dysrhythmias Atrial Fibrillation + Valvular Problems/Murmurs MVP and MR    '17 TTE - mild concentric LVH. EF 55% to 60%. Mild AI. Mild anterior MV leafelt prolapse with mild MR. PA peak pressure: 36 mm Hg     Neuro/Psych  Neuromuscular disease (peripheral neuropathy) negative psych ROS   GI/Hepatic Neg liver ROS, GERD  Controlled,  Endo/Other  negative endocrine ROS  Renal/GU negative Renal ROS     Musculoskeletal  (+) Arthritis ,  Scoliosis    Abdominal   Peds  Hematology negative hematology ROS (+)   Anesthesia Other Findings Pt seen by PCP, Dr. Anitra Lauth, 03/04/2019 for preoperative evaluation.  Per OV note, "Cardiolgy has cleared her.  Her medical issues are maximally managed/controlled.  Filled out and signed clearance form today, faxed to her orthopedist.  No labs needed today.  She will be getting pre-op labs next week by ortho.  She'll stop eliquis 24 hours to surgery."  Per Almyra Deforest, PA-C with cardiology on 02/08/2019, "Montez A Cardwellwas last seen on 2/28/2020by Dr. Martinique. Since that day, Kaela A Cardwellhas done well without any exertional chest pain or shortness of breath.  Therefore, based on ACC/AHA guidelines, the patient would be at acceptable risk for the planned procedure without further cardiovascular testing.  Please call with questions.Patient  has been informed to hold Eliquis for 2 days. We recommend she restart the Eliquis as soon as possible after the surgery at the discretion of the surgeon."  Reproductive/Obstetrics                         Anesthesia Physical Anesthesia Plan  ASA: III  Anesthesia Plan: General   Post-op Pain Management:  Regional for Post-op pain   Induction: Intravenous  PONV Risk Score and Plan: 3 and Treatment may vary due to age or medical condition, Ondansetron and Dexamethasone  Airway Management Planned: Oral ETT  Additional Equipment: None  Intra-op Plan:   Post-operative Plan: Extubation in OR  Informed Consent: I have reviewed the patients History and Physical, chart, labs and discussed the procedure including the risks, benefits and alternatives for the proposed anesthesia with the patient or authorized representative who has indicated his/her understanding and acceptance.     Dental advisory given  Plan Discussed with: CRNA and Anesthesiologist  Anesthesia Plan Comments:       Anesthesia Quick Evaluation

## 2019-03-11 ENCOUNTER — Inpatient Hospital Stay (HOSPITAL_COMMUNITY): Payer: Medicare Other | Admitting: Anesthesiology

## 2019-03-11 ENCOUNTER — Encounter (HOSPITAL_COMMUNITY): Payer: Self-pay | Admitting: *Deleted

## 2019-03-11 ENCOUNTER — Inpatient Hospital Stay (HOSPITAL_COMMUNITY): Payer: Medicare Other | Admitting: Physician Assistant

## 2019-03-11 ENCOUNTER — Encounter (HOSPITAL_COMMUNITY)
Admission: RE | Disposition: A | Discharge status: Home / Self Care | Payer: Self-pay | Source: Home / Self Care | Attending: Orthopedic Surgery

## 2019-03-11 ENCOUNTER — Inpatient Hospital Stay (HOSPITAL_COMMUNITY)
Admission: RE | Admit: 2019-03-11 | Discharge: 2019-03-12 | DRG: 483 | Disposition: A | Discharge status: Home / Self Care | Payer: Medicare Other | Attending: Orthopedic Surgery | Admitting: Orthopedic Surgery

## 2019-03-11 ENCOUNTER — Inpatient Hospital Stay (HOSPITAL_COMMUNITY): Payer: Medicare Other

## 2019-03-11 ENCOUNTER — Other Ambulatory Visit: Payer: Self-pay

## 2019-03-11 DIAGNOSIS — Z882 Allergy status to sulfonamides status: Secondary | ICD-10-CM

## 2019-03-11 DIAGNOSIS — Z90710 Acquired absence of both cervix and uterus: Secondary | ICD-10-CM

## 2019-03-11 DIAGNOSIS — I480 Paroxysmal atrial fibrillation: Secondary | ICD-10-CM | POA: Diagnosis not present

## 2019-03-11 DIAGNOSIS — Z888 Allergy status to other drugs, medicaments and biological substances status: Secondary | ICD-10-CM | POA: Diagnosis not present

## 2019-03-11 DIAGNOSIS — M19011 Primary osteoarthritis, right shoulder: Principal | ICD-10-CM | POA: Diagnosis present

## 2019-03-11 DIAGNOSIS — Z471 Aftercare following joint replacement surgery: Secondary | ICD-10-CM | POA: Diagnosis not present

## 2019-03-11 DIAGNOSIS — I341 Nonrheumatic mitral (valve) prolapse: Secondary | ICD-10-CM | POA: Diagnosis present

## 2019-03-11 DIAGNOSIS — K219 Gastro-esophageal reflux disease without esophagitis: Secondary | ICD-10-CM | POA: Diagnosis present

## 2019-03-11 DIAGNOSIS — Z96652 Presence of left artificial knee joint: Secondary | ICD-10-CM | POA: Diagnosis not present

## 2019-03-11 DIAGNOSIS — M4125 Other idiopathic scoliosis, thoracolumbar region: Secondary | ICD-10-CM | POA: Diagnosis present

## 2019-03-11 DIAGNOSIS — I10 Essential (primary) hypertension: Secondary | ICD-10-CM | POA: Diagnosis not present

## 2019-03-11 DIAGNOSIS — M75101 Unspecified rotator cuff tear or rupture of right shoulder, not specified as traumatic: Secondary | ICD-10-CM | POA: Diagnosis present

## 2019-03-11 DIAGNOSIS — E785 Hyperlipidemia, unspecified: Secondary | ICD-10-CM | POA: Diagnosis not present

## 2019-03-11 DIAGNOSIS — Z96611 Presence of right artificial shoulder joint: Secondary | ICD-10-CM | POA: Diagnosis not present

## 2019-03-11 DIAGNOSIS — Z7901 Long term (current) use of anticoagulants: Secondary | ICD-10-CM

## 2019-03-11 DIAGNOSIS — M25711 Osteophyte, right shoulder: Secondary | ICD-10-CM | POA: Diagnosis present

## 2019-03-11 DIAGNOSIS — G629 Polyneuropathy, unspecified: Secondary | ICD-10-CM | POA: Diagnosis present

## 2019-03-11 DIAGNOSIS — Z79899 Other long term (current) drug therapy: Secondary | ICD-10-CM | POA: Diagnosis not present

## 2019-03-11 DIAGNOSIS — Z9049 Acquired absence of other specified parts of digestive tract: Secondary | ICD-10-CM

## 2019-03-11 DIAGNOSIS — Z79891 Long term (current) use of opiate analgesic: Secondary | ICD-10-CM

## 2019-03-11 DIAGNOSIS — M810 Age-related osteoporosis without current pathological fracture: Secondary | ICD-10-CM | POA: Diagnosis present

## 2019-03-11 DIAGNOSIS — I340 Nonrheumatic mitral (valve) insufficiency: Secondary | ICD-10-CM | POA: Diagnosis not present

## 2019-03-11 DIAGNOSIS — G8918 Other acute postprocedural pain: Secondary | ICD-10-CM | POA: Diagnosis not present

## 2019-03-11 HISTORY — PX: REVERSE SHOULDER ARTHROPLASTY: SHX5054

## 2019-03-11 SURGERY — ARTHROPLASTY, SHOULDER, TOTAL, REVERSE
Anesthesia: General | Site: Shoulder | Laterality: Right

## 2019-03-11 MED ORDER — PRAVASTATIN SODIUM 20 MG PO TABS
40.0000 mg | ORAL_TABLET | Freq: Every day | ORAL | Status: DC
  Administered 2019-03-11 – 2019-03-12 (×2): 40 mg via ORAL
  Filled 2019-03-11 (×2): qty 2

## 2019-03-11 MED ORDER — FLECAINIDE ACETATE 100 MG PO TABS
100.0000 mg | ORAL_TABLET | Freq: Two times a day (BID) | ORAL | Status: DC
  Administered 2019-03-11 – 2019-03-12 (×2): 100 mg via ORAL
  Filled 2019-03-11 (×2): qty 1

## 2019-03-11 MED ORDER — FENTANYL CITRATE (PF) 100 MCG/2ML IJ SOLN
INTRAMUSCULAR | Status: AC
  Filled 2019-03-11: qty 2

## 2019-03-11 MED ORDER — HYDROCODONE-ACETAMINOPHEN 5-325 MG PO TABS: 1.0000 | ORAL_TABLET | Freq: Four times a day (QID) | ORAL | Status: DC | PRN

## 2019-03-11 MED ORDER — FENTANYL CITRATE (PF) 100 MCG/2ML IJ SOLN
INTRAMUSCULAR | Status: DC | PRN
  Administered 2019-03-11 (×2): 50 ug via INTRAVENOUS

## 2019-03-11 MED ORDER — SUCCINYLCHOLINE CHLORIDE 200 MG/10ML IV SOSY
PREFILLED_SYRINGE | INTRAVENOUS | Status: DC | PRN
  Administered 2019-03-11: 100 mg via INTRAVENOUS

## 2019-03-11 MED ORDER — APIXABAN 5 MG PO TABS
5.0000 mg | ORAL_TABLET | Freq: Two times a day (BID) | ORAL | Status: DC
  Administered 2019-03-12: 5 mg via ORAL
  Filled 2019-03-11: qty 1

## 2019-03-11 MED ORDER — CALCIUM CARBONATE 1250 (500 CA) MG PO TABS
600.0000 mg | ORAL_TABLET | Freq: Two times a day (BID) | ORAL | Status: DC
  Administered 2019-03-12: 625 mg via ORAL
  Filled 2019-03-11: qty 1

## 2019-03-11 MED ORDER — PROPOFOL 10 MG/ML IV BOLUS
INTRAVENOUS | Status: AC
  Filled 2019-03-11: qty 20

## 2019-03-11 MED ORDER — CEFAZOLIN SODIUM-DEXTROSE 2-4 GM/100ML-% IV SOLN
2.0000 g | INTRAVENOUS | Status: AC
  Administered 2019-03-11: 2 g via INTRAVENOUS
  Filled 2019-03-11: qty 100

## 2019-03-11 MED ORDER — SODIUM CHLORIDE 0.9 % IV SOLN
INTRAVENOUS | Status: DC | PRN
  Administered 2019-03-11: 10 ug/min via INTRAVENOUS

## 2019-03-11 MED ORDER — PRIMIDONE 50 MG PO TABS
50.0000 mg | ORAL_TABLET | Freq: Two times a day (BID) | ORAL | Status: DC
  Administered 2019-03-11 – 2019-03-12 (×2): 50 mg via ORAL
  Filled 2019-03-11 (×2): qty 1

## 2019-03-11 MED ORDER — BUPIVACAINE-EPINEPHRINE (PF) 0.25% -1:200000 IJ SOLN
INTRAMUSCULAR | Status: DC | PRN
  Administered 2019-03-11: 12 mL

## 2019-03-11 MED ORDER — METOPROLOL TARTRATE 25 MG PO TABS
25.0000 mg | ORAL_TABLET | Freq: Two times a day (BID) | ORAL | Status: DC
  Administered 2019-03-11: 25 mg via ORAL
  Filled 2019-03-11 (×2): qty 1

## 2019-03-11 MED ORDER — LIDOCAINE 2% (20 MG/ML) 5 ML SYRINGE
INTRAMUSCULAR | Status: AC
  Filled 2019-03-11: qty 5

## 2019-03-11 MED ORDER — PHENYLEPHRINE HCL (PRESSORS) 10 MG/ML IV SOLN
INTRAVENOUS | Status: AC
  Filled 2019-03-11: qty 2

## 2019-03-11 MED ORDER — ACETAMINOPHEN 325 MG PO TABS
325.0000 mg | ORAL_TABLET | Freq: Four times a day (QID) | ORAL | Status: DC | PRN
  Filled 2019-03-11: qty 2

## 2019-03-11 MED ORDER — OXYCODONE HCL 5 MG/5ML PO SOLN: 5.0000 mg | Freq: Once | ORAL | Status: DC | PRN

## 2019-03-11 MED ORDER — DOCUSATE SODIUM 100 MG PO CAPS
100.0000 mg | ORAL_CAPSULE | Freq: Two times a day (BID) | ORAL | Status: DC
  Administered 2019-03-12: 100 mg via ORAL
  Filled 2019-03-11 (×2): qty 1

## 2019-03-11 MED ORDER — POLYETHYLENE GLYCOL 3350 17 G PO PACK: 17.0000 g | PACK | Freq: Every day | ORAL | Status: DC | PRN

## 2019-03-11 MED ORDER — EPHEDRINE 5 MG/ML INJ
INTRAVENOUS | Status: AC
  Filled 2019-03-11: qty 10

## 2019-03-11 MED ORDER — TRANEXAMIC ACID-NACL 1000-0.7 MG/100ML-% IV SOLN
INTRAVENOUS | Status: AC
  Administered 2019-03-11: 1000 mg via INTRAVENOUS
  Filled 2019-03-11: qty 100

## 2019-03-11 MED ORDER — NITROGLYCERIN 0.4 MG SL SUBL: 0.4000 mg | SUBLINGUAL_TABLET | SUBLINGUAL | Status: DC | PRN

## 2019-03-11 MED ORDER — TRANEXAMIC ACID-NACL 1000-0.7 MG/100ML-% IV SOLN
INTRAVENOUS | Status: DC | PRN
  Administered 2019-03-11: 1000 mg via INTRAVENOUS

## 2019-03-11 MED ORDER — LACTATED RINGERS IV SOLN
INTRAVENOUS | Status: DC
  Administered 2019-03-11: 06:00:00 via INTRAVENOUS

## 2019-03-11 MED ORDER — STERILE WATER FOR IRRIGATION IR SOLN
Status: DC | PRN
  Administered 2019-03-11: 2000 mL

## 2019-03-11 MED ORDER — GABAPENTIN 300 MG PO CAPS
300.0000 mg | ORAL_CAPSULE | Freq: Two times a day (BID) | ORAL | Status: DC
  Administered 2019-03-11: 300 mg via ORAL
  Filled 2019-03-11: qty 1

## 2019-03-11 MED ORDER — ONDANSETRON HCL 4 MG/2ML IJ SOLN
INTRAMUSCULAR | Status: AC
  Filled 2019-03-11: qty 2

## 2019-03-11 MED ORDER — MIDAZOLAM HCL 2 MG/2ML IJ SOLN
INTRAMUSCULAR | Status: AC
  Filled 2019-03-11: qty 2

## 2019-03-11 MED ORDER — FISH OIL 1200 MG PO CAPS: 1000.0000 mg | ORAL_CAPSULE | Freq: Two times a day (BID) | ORAL | Status: DC

## 2019-03-11 MED ORDER — CHLORHEXIDINE GLUCONATE 4 % EX LIQD: 60.0000 mL | Freq: Once | CUTANEOUS | Status: DC

## 2019-03-11 MED ORDER — PHENOL 1.4 % MT LIQD: 1.0000 | OROMUCOSAL | Status: DC | PRN

## 2019-03-11 MED ORDER — ONDANSETRON HCL 4 MG/2ML IJ SOLN: 4.0000 mg | Freq: Once | INTRAMUSCULAR | Status: DC | PRN

## 2019-03-11 MED ORDER — LIDOCAINE 2% (20 MG/ML) 5 ML SYRINGE
INTRAMUSCULAR | Status: DC | PRN
  Administered 2019-03-11: 40 mg via INTRAVENOUS

## 2019-03-11 MED ORDER — ONDANSETRON HCL 4 MG PO TABS: 4.0000 mg | ORAL_TABLET | Freq: Four times a day (QID) | ORAL | Status: DC | PRN

## 2019-03-11 MED ORDER — TRANEXAMIC ACID-NACL 1000-0.7 MG/100ML-% IV SOLN
1000.0000 mg | Freq: Once | INTRAVENOUS | Status: AC
  Administered 2019-03-11: 13:00:00 1000 mg via INTRAVENOUS
  Filled 2019-03-11: qty 100

## 2019-03-11 MED ORDER — EPHEDRINE SULFATE-NACL 50-0.9 MG/10ML-% IV SOSY
PREFILLED_SYRINGE | INTRAVENOUS | Status: DC | PRN
  Administered 2019-03-11: 10 mg via INTRAVENOUS
  Administered 2019-03-11: 5 mg via INTRAVENOUS

## 2019-03-11 MED ORDER — BUPIVACAINE LIPOSOME 1.3 % IJ SUSP
INTRAMUSCULAR | Status: DC | PRN
  Administered 2019-03-11: 10 mL via PERINEURAL

## 2019-03-11 MED ORDER — MENTHOL 3 MG MT LOZG: 1.0000 | LOZENGE | OROMUCOSAL | Status: DC | PRN

## 2019-03-11 MED ORDER — MORPHINE SULFATE (PF) 2 MG/ML IV SOLN: 0.5000 mg | INTRAVENOUS | Status: DC | PRN

## 2019-03-11 MED ORDER — FENTANYL CITRATE (PF) 100 MCG/2ML IJ SOLN: 25.0000 ug | INTRAMUSCULAR | Status: DC | PRN

## 2019-03-11 MED ORDER — METOCLOPRAMIDE HCL 5 MG/ML IJ SOLN: 5.0000 mg | Freq: Three times a day (TID) | INTRAMUSCULAR | Status: DC | PRN

## 2019-03-11 MED ORDER — OMEGA-3-ACID ETHYL ESTERS 1 G PO CAPS
1.0000 g | ORAL_CAPSULE | Freq: Two times a day (BID) | ORAL | Status: DC
  Administered 2019-03-11 – 2019-03-12 (×2): 1 g via ORAL
  Filled 2019-03-11 (×2): qty 1

## 2019-03-11 MED ORDER — BISACODYL 10 MG RE SUPP: 10.0000 mg | Freq: Every day | RECTAL | Status: DC | PRN

## 2019-03-11 MED ORDER — ONDANSETRON HCL 4 MG/2ML IJ SOLN: 4.0000 mg | Freq: Four times a day (QID) | INTRAMUSCULAR | Status: DC | PRN

## 2019-03-11 MED ORDER — ONDANSETRON HCL 4 MG/2ML IJ SOLN
INTRAMUSCULAR | Status: DC | PRN
  Administered 2019-03-11: 4 mg via INTRAVENOUS

## 2019-03-11 MED ORDER — SUCCINYLCHOLINE CHLORIDE 200 MG/10ML IV SOSY
PREFILLED_SYRINGE | INTRAVENOUS | Status: AC
  Filled 2019-03-11: qty 10

## 2019-03-11 MED ORDER — CEFAZOLIN SODIUM-DEXTROSE 2-4 GM/100ML-% IV SOLN
2.0000 g | Freq: Four times a day (QID) | INTRAVENOUS | Status: AC
  Administered 2019-03-11 – 2019-03-12 (×3): 2 g via INTRAVENOUS
  Filled 2019-03-11 (×3): qty 100

## 2019-03-11 MED ORDER — METOCLOPRAMIDE HCL 5 MG PO TABS: 5.0000 mg | ORAL_TABLET | Freq: Three times a day (TID) | ORAL | Status: DC | PRN

## 2019-03-11 MED ORDER — BUPIVACAINE HCL (PF) 0.5 % IJ SOLN
INTRAMUSCULAR | Status: DC | PRN
  Administered 2019-03-11: 15 mL via PERINEURAL

## 2019-03-11 MED ORDER — MIDAZOLAM HCL 5 MG/5ML IJ SOLN
INTRAMUSCULAR | Status: DC | PRN
  Administered 2019-03-11: 1 mg via INTRAVENOUS

## 2019-03-11 MED ORDER — HYDROCODONE-ACETAMINOPHEN 5-325 MG PO TABS: 1.0000 | ORAL_TABLET | Freq: Three times a day (TID) | ORAL | 0 refills | Status: DC

## 2019-03-11 MED ORDER — DEXAMETHASONE SODIUM PHOSPHATE 10 MG/ML IJ SOLN
INTRAMUSCULAR | Status: AC
  Filled 2019-03-11: qty 1

## 2019-03-11 MED ORDER — GABAPENTIN 300 MG PO CAPS
600.0000 mg | ORAL_CAPSULE | Freq: Every day | ORAL | Status: DC
  Administered 2019-03-11: 600 mg via ORAL
  Filled 2019-03-11: qty 2

## 2019-03-11 MED ORDER — BIOTIN 5000 MCG PO CAPS: 5000.0000 ug | ORAL_CAPSULE | Freq: Every day | ORAL | Status: DC

## 2019-03-11 MED ORDER — CYANOCOBALAMIN 1000 MCG/ML IJ SOLN
1000.0000 ug | INTRAMUSCULAR | Status: DC
  Filled 2019-03-11: qty 1

## 2019-03-11 MED ORDER — SODIUM CHLORIDE 0.9 % IV SOLN
INTRAVENOUS | Status: DC
  Administered 2019-03-11: 50 mL/h via INTRAVENOUS

## 2019-03-11 MED ORDER — DEXAMETHASONE SODIUM PHOSPHATE 10 MG/ML IJ SOLN
INTRAMUSCULAR | Status: DC | PRN
  Administered 2019-03-11: 8 mg via INTRAVENOUS

## 2019-03-11 MED ORDER — PROPOFOL 10 MG/ML IV BOLUS
INTRAVENOUS | Status: DC | PRN
  Administered 2019-03-11: 130 mg via INTRAVENOUS

## 2019-03-11 MED ORDER — 0.9 % SODIUM CHLORIDE (POUR BTL) OPTIME
TOPICAL | Status: DC | PRN
  Administered 2019-03-11: 1000 mL

## 2019-03-11 MED ORDER — OXYCODONE HCL 5 MG PO TABS: 5.0000 mg | ORAL_TABLET | Freq: Once | ORAL | Status: DC | PRN

## 2019-03-11 MED ORDER — BUPIVACAINE-EPINEPHRINE (PF) 0.25% -1:200000 IJ SOLN
INTRAMUSCULAR | Status: AC
  Filled 2019-03-11: qty 30

## 2019-03-11 SURGICAL SUPPLY — 73 items
AID PSTN UNV HD RSTRNT DISP (MISCELLANEOUS) ×1
BAG SPEC THK2 15X12 ZIP CLS (MISCELLANEOUS) ×1
BAG ZIPLOCK 12X15 (MISCELLANEOUS) ×2 IMPLANT
BIT DRILL 1.6MX128 (BIT) ×1 IMPLANT
BIT DRILL 170X2.5X (BIT) IMPLANT
BIT DRL 170X2.5X (BIT) ×1
BLADE SAG 18X100X1.27 (BLADE) ×2 IMPLANT
COVER BACK TABLE 60X90IN (DRAPES) ×2 IMPLANT
COVER SURGICAL LIGHT HANDLE (MISCELLANEOUS) ×3 IMPLANT
COVER WAND RF STERILE (DRAPES) IMPLANT
DECANTER SPIKE VIAL GLASS SM (MISCELLANEOUS) ×2 IMPLANT
DRAPE INCISE IOBAN 66X45 STRL (DRAPES) ×2 IMPLANT
DRAPE ORTHO SPLIT 77X108 STRL (DRAPES) ×4
DRAPE SHEET LG 3/4 BI-LAMINATE (DRAPES) ×2 IMPLANT
DRAPE SURG ORHT 6 SPLT 77X108 (DRAPES) ×2 IMPLANT
DRAPE U-SHAPE 47X51 STRL (DRAPES) ×2 IMPLANT
DRILL 2.5 (BIT) ×2
DRSG ADAPTIC 3X8 NADH LF (GAUZE/BANDAGES/DRESSINGS) ×2 IMPLANT
DRSG PAD ABDOMINAL 8X10 ST (GAUZE/BANDAGES/DRESSINGS) ×2 IMPLANT
DURAPREP 26ML APPLICATOR (WOUND CARE) ×2 IMPLANT
ECCENTRIC EPIPHYSI MODULAR SZ1 (Trauma) IMPLANT
ELECT BLADE TIP CTD 4 INCH (ELECTRODE) ×2 IMPLANT
ELECT NDL TIP 2.8 STRL (NEEDLE) ×1 IMPLANT
ELECT NEEDLE TIP 2.8 STRL (NEEDLE) ×2 IMPLANT
ELECT REM PT RETURN 15FT ADLT (MISCELLANEOUS) ×2 IMPLANT
GAUZE SPONGE 4X4 12PLY STRL (GAUZE/BANDAGES/DRESSINGS) ×2 IMPLANT
GLENOSPHERE DXTEND STD 38 (Orthopedic Implant) IMPLANT
GLENSOPHERE DXTEND STD 38 (Orthopedic Implant) ×2 IMPLANT
GLOVE BIOGEL PI ORTHO PRO 7.5 (GLOVE) ×1
GLOVE BIOGEL PI ORTHO PRO SZ8 (GLOVE) ×1
GLOVE ORTHO TXT STRL SZ7.5 (GLOVE) ×2 IMPLANT
GLOVE PI ORTHO PRO STRL 7.5 (GLOVE) ×1 IMPLANT
GLOVE PI ORTHO PRO STRL SZ8 (GLOVE) ×1 IMPLANT
GLOVE SURG ORTHO 8.5 STRL (GLOVE) ×2 IMPLANT
GOWN STRL REUS W/TWL XL LVL3 (GOWN DISPOSABLE) ×4 IMPLANT
KIT BASIN OR (CUSTOM PROCEDURE TRAY) ×2 IMPLANT
KIT TURNOVER KIT A (KITS) IMPLANT
MANIFOLD NEPTUNE II (INSTRUMENTS) ×2 IMPLANT
METAGLENE DELTA EXTEND (Trauma) IMPLANT
METAGLENE DXTEND (Trauma) ×2 IMPLANT
MODULAR ECCENTRIC EPIPHYSI SZ1 (Trauma) ×2 IMPLANT
NDL MAYO CATGUT SZ4 TPR NDL (NEEDLE) IMPLANT
NEEDLE MAYO CATGUT SZ4 (NEEDLE) ×2 IMPLANT
NS IRRIG 1000ML POUR BTL (IV SOLUTION) ×2 IMPLANT
PACK SHOULDER (CUSTOM PROCEDURE TRAY) ×2 IMPLANT
PIN GUIDE GLENOPHERE 1.5MX300M (PIN) ×1 IMPLANT
PIN METAGLENE 2.5 (PIN) ×1 IMPLANT
PROTECTOR NERVE ULNAR (MISCELLANEOUS) ×2 IMPLANT
RESTRAINT HEAD UNIVERSAL NS (MISCELLANEOUS) ×2 IMPLANT
SCREW 4.5X18MM (Screw) ×2 IMPLANT
SCREW BN 18X4.5XSTRL SHLDR (Screw) IMPLANT
SCREW LOCK 42 (Screw) ×1 IMPLANT
SCREW LOCK DELTA XTEND 4.5X30 (Screw) ×1 IMPLANT
SLING ARM FOAM STRAP LRG (SOFTGOODS) IMPLANT
SLING ARM FOAM STRAP MED (SOFTGOODS) ×1 IMPLANT
SMARTMIX MINI TOWER (MISCELLANEOUS)
SPACER 38 PLUS 3 (Spacer) ×1 IMPLANT
SPONGE LAP 4X18 RFD (DISPOSABLE) ×1 IMPLANT
STEM DELTA DIA 10 HA (Stem) ×1 IMPLANT
STRIP CLOSURE SKIN 1/2X4 (GAUZE/BANDAGES/DRESSINGS) ×2 IMPLANT
SUCTION FRAZIER HANDLE 10FR (MISCELLANEOUS) ×1
SUCTION TUBE FRAZIER 10FR DISP (MISCELLANEOUS) ×1 IMPLANT
SUT FIBERWIRE #2 38 T-5 BLUE (SUTURE) ×8
SUT MNCRL AB 4-0 PS2 18 (SUTURE) ×2 IMPLANT
SUT VIC AB 0 CT1 36 (SUTURE) ×4 IMPLANT
SUT VIC AB 0 CT2 27 (SUTURE) ×2 IMPLANT
SUT VIC AB 2-0 CT1 27 (SUTURE) ×2
SUT VIC AB 2-0 CT1 TAPERPNT 27 (SUTURE) ×1 IMPLANT
SUTURE FIBERWR #2 38 T-5 BLUE (SUTURE) ×1 IMPLANT
TAPE CLOTH SURG 4X10 WHT LF (GAUZE/BANDAGES/DRESSINGS) ×1 IMPLANT
TOWEL OR 17X26 10 PK STRL BLUE (TOWEL DISPOSABLE) ×2 IMPLANT
TOWER SMARTMIX MINI (MISCELLANEOUS) IMPLANT
YANKAUER SUCT BULB TIP 10FT TU (MISCELLANEOUS) ×2 IMPLANT

## 2019-03-11 NOTE — Plan of Care (Signed)
progressing 

## 2019-03-11 NOTE — Anesthesia Postprocedure Evaluation (Signed)
Anesthesia Post Note  Patient: Cassie White  Procedure(s) Performed: REVERSE TOTAL SHOULDER ARTHROPLASTY (Right Shoulder)     Patient location during evaluation: PACU Anesthesia Type: General Level of consciousness: awake and alert Pain management: pain level controlled Vital Signs Assessment: post-procedure vital signs reviewed and stable Respiratory status: spontaneous breathing, nonlabored ventilation, respiratory function stable and patient connected to nasal cannula oxygen Cardiovascular status: blood pressure returned to baseline and stable Postop Assessment: no apparent nausea or vomiting Anesthetic complications: no    Last Vitals:  Vitals:   03/11/19 1015 03/11/19 1030  BP: 122/64 126/63  Pulse: (!) 56 60  Resp: 14 14  Temp:    SpO2: 94% 96%    Last Pain:  Vitals:   03/11/19 1015  TempSrc:   PainSc: 0-No pain                 Audry Pili

## 2019-03-11 NOTE — Op Note (Signed)
NAME: Cassie White, Cassie A. MEDICAL RECORD [id number] ACCOUNT [id number] DATE OF BIRTH:May 19, 1941 FACILITY: [facility] LOCATION: [facility] PHYSICIAN:STEVEN R. 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  POSTOPERATIVE DIAGNOSIS:  Right shoulder end-stage osteoarthritis with rotator cuff tear arthropathy.  PROCEDURE PERFORMED:  Right reverse total shoulder arthroplasty using DePuy Delta Xtend prosthesis with subscapularis repair.  ATTENDING SURGEON:  Esmond Plants, MD  ASSISTANT:  Darol Destine, [place], who was scrubbed during the entire procedure and necessary for satisfactory completion of surgery.  ANESTHESIA:  General anesthesia was used plus interscalene block.  ESTIMATED BLOOD LOSS:  Less than 50 mL.  FLUID REPLACEMENT:  1200 mL crystalloid.  INSTRUMENT COUNTS:  Correct.  COMPLICATIONS:  No complications.  ANTIBIOTICS:  Perioperative antibiotics were given.  INDICATIONS:  The patient is a 78 year old female with worsening right shoulder pain and dysfunction secondary to rotator cuff tear arthropathy.  She has had progressive pain and loss of function despite conservative management.  She presents for  operative treatment with reverse total shoulder arthroplasty to restore function and eliminate pain to her shoulder.  Informed consent obtained.  DESCRIPTION OF PROCEDURE:  After an adequate level of anesthesia was achieved, the patient was positioned in modified beach chair position.  Right shoulder correctly identified and sterilely prepped and draped in the usual manner.  Time-out called  verifying correct patient, correct site.  We entered the patient's shoulder using standard deltopectoral incision, starting at the coracoid process, extending down to the anterior humerus.  Dissection down through subcutaneous tissues using Bovie.  We  identified the cephalic vein,  took it laterally with the deltoid pectoralis taken medially.  Conjoined tendon identified and retracted medially.  Biceps was tenodesed in situ with 0 Vicryl figure-of-eight suture.  We then released the subscapularis and  tagged for repair at the end with #2 FiberWire suture in a modified Mason-Allen suture technique.  We then released the inferior capsule extending the shoulder and delivered the humerus out of the wound.  We entered the proximal humerus with a 6 mm  reamer, reaming up to a size 10 mm.  We placed a 10 mm guide.  Intramedullary resection guide in the humerus and then resected the head at 10 degrees of retroversion with an oscillating saw using the head resection guide.  Next, we removed excess  osteophytes with a rongeur.  We then reduced the shoulder and subluxed the humerus posteriorly.  We placed our retractors.  We identified 360-degree visualization of the glenoid face.  We removed the glenoid labrum and the capsule and had excellent  exposure of the glenoid.  We removed the remaining cartilage from the glenoid face with a Cobb elevator.  We placed our central guide pin using a guide, angled it inferiorly slightly.  We then went ahead and drilled our guide pin.  We then reamed for the  metaglene baseplate and then drilled our central peg hole, impacted the metaglene baseplate into place.  We then placed a 42 locked screw inferiorly, a 30 locked screw at the base of the coracoid superiorly, and an 18 nonlocked posteriorly.  We had  excellent baseplate fixation and support.  We then placed a 38 standard glenosphere into position on the metaglene and locked that in position, did a finger sweep to make sure we had no soft tissue caught up in that bearing.  We then went ahead and  extended the shoulder back out of  the wound, the humerus back out the wound, and finished our humeral preparation with an Epi-1 right metaphyseal reamer.  We then placed our trial for our 10 stem and our 1 right  eccentric and then put that in 10 degrees  of retroversion set on the 0 setting with a 38+3 trial.  We reduced the shoulder, had a nice little pop as it seated, and everything was stable.  No gapping with inferior pole or gapping with external rotation and an excellent range of motion, no  impingement.  We removed all trial components, irrigated thoroughly, drilled holes in the lesser tuberosity, placed #2 FiberWire sutures through those drill holes to repair the subscap.  We then used impaction grafting technique with available bone graft  from the head and HA-coated 10 stem with 1 right epiphysis, and that was set on the 0 setting and placed in 10 degrees of retroversion, impacted that into position, and had a stable implant.  We then selected a 38+3 poly and impacted that on the humeral  side and reduced the shoulder again.  We had a nice snug reduction.  Everything was under appropriate tension with no instability and no impingement.  We irrigated thoroughly and then repaired the subscapularis anatomically back to the lesser tuberosity  with sutures through bone.  This did not limit motion at all.  We feel like this will enhance function and stability postop.  We then repaired the deltopectoral interval with 0 Vicryl suture after irrigation and 2-0 Vicryl subcutaneous closure and 4-0  Monocryl for skin.  Steri-Strips applied followed by a sterile dressing.  The patient tolerated surgery well.  LN/NUANCE  D:03/11/2019 T:03/11/2019 JOB:[id number]

## 2019-03-11 NOTE — Discharge Instructions (Signed)
Ice to the shoulder constantly.  Keep the incision covered and clean and dry for one week, then ok to get it wet in the shower. ° °Do exercise as instructed several times per day. ° °DO NOT reach behind your back or push up out of a chair with the operative arm. ° °Use a sling while you are up and around for comfort, may remove while seated.  Keep pillow propped behind the operative elbow. ° °Follow up with Dr Saria Haran in two weeks in the office, call [phone] for appt °

## 2019-03-11 NOTE — Anesthesia Procedure Notes (Signed)
Anesthesia Regional Block: Interscalene brachial plexus block   Pre-Anesthetic Checklist: ,, timeout performed, Correct Patient, Correct Site, Correct Laterality, Correct Procedure, Correct Position, site marked, Risks and benefits discussed,  Surgical consent,  Pre-op evaluation,  At surgeon's request and post-op pain management  Laterality: Right  Prep: chloraprep       Needles:  Injection technique: Single-shot  Needle Type: Echogenic Needle     Needle Length: 5cm  Needle Gauge: 21     Additional Needles:   Narrative:  Start time: 03/11/2019 7:05 AM End time: 03/11/2019 7:08 AM Injection made incrementally with aspirations every 5 mL.  Performed by: Personally  Anesthesiologist: Audry Pili, MD  Additional Notes: No pain on injection. No increased resistance to injection. Injection made in 5cc increments. Good needle visualization. Patient tolerated the procedure well.

## 2019-03-11 NOTE — Interval H&P Note (Signed)
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  The various methods of treatment have been discussed with the patient and family. After consideration of risks, benefits and other options for treatment, the patient has consented to  Procedure(s) with comments: REVERSE SHOULDER ARTHROPLASTY (Right) - Intersclene block as a surgical intervention.  The patient's history has been reviewed, patient examined, no change in status, stable for surgery.  I have reviewed the patient's chart and labs.  Questions were answered to the patient's satisfaction.     Augustin Schooling

## 2019-03-11 NOTE — Anesthesia Procedure Notes (Signed)
Procedure Name: Intubation Date/Time: 03/11/2019 7:36 AM Performed by: Victoriano Lain, CRNA Pre-anesthesia Checklist: Patient identified, Emergency Drugs available, Suction available, Patient being monitored and Timeout performed Patient Re-evaluated:Patient Re-evaluated prior to induction Oxygen Delivery Method: Circle system utilized Preoxygenation: Pre-oxygenation with 100% oxygen Induction Type: IV induction, Rapid sequence and Cricoid Pressure applied Ventilation: Mask ventilation without difficulty Laryngoscope Size: Mac and 3 Grade View: Grade I Tube type: Oral Tube size: 7.0 mm Number of attempts: 1 Airway Equipment and Method: Stylet Placement Confirmation: ETT inserted through vocal cords under direct vision,  positive ETCO2 and breath sounds checked- equal and bilateral Secured at: 21 cm Tube secured with: Tape Dental Injury: Teeth and Oropharynx as per pre-operative assessment

## 2019-03-11 NOTE — Brief Op Note (Signed)
03/11/2019  9:06 AM  PATIENT:  Cassie White  78 y.o. 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  PROCEDURE:  Procedure(s) with comments: REVERSE SHOULDER ARTHROPLASTY (Right) - Intersclene block DePuy Delta Xtend with subscapularis repair  SURGEON:  Surgeon(s) and Role:    Netta Cedars, MD - Primary  PHYSICIAN ASSISTANT:   ASSISTANTS: [place] Bruns, PA-C   ANESTHESIA:   regional and general  EBL:  50 mL   BLOOD ADMINISTERED:none  DRAINS: none   LOCAL MEDICATIONS USED:  MARCAINE     SPECIMEN:  No Specimen  DISPOSITION OF SPECIMEN:  N/A  COUNTS:  YES  TOURNIQUET:  * No tourniquets in log *  DICTATION: .Other Dictation: Dictation Number [id number]  PLAN OF CARE: Admit to inpatient   PATIENT DISPOSITION:  PACU - hemodynamically stable.   Delay start of Pharmacological VTE agent (>24hrs) due to surgical blood loss or risk of bleeding: no

## 2019-03-11 NOTE — Transfer of Care (Signed)
Immediate Anesthesia Transfer of Care Note  Patient: Cassie White  Procedure(s) Performed: REVERSE TOTAL SHOULDER ARTHROPLASTY (Right Shoulder)  Patient Location: PACU  Anesthesia Type:General  Level of Consciousness: awake, alert , oriented and patient cooperative  Airway & Oxygen Therapy: Patient Spontanous Breathing and Patient connected to face mask oxygen  Post-op Assessment: Report given to RN, Post -op Vital signs reviewed and stable and Patient moving all extremities  Post vital signs: Reviewed and stable  Last Vitals:  Vitals Value Taken Time  BP 135/57 03/11/19 0925  Temp    Pulse 62 03/11/19 0926  Resp 20 03/11/19 0926  SpO2 98 % 03/11/19 0926  Vitals shown include unvalidated device data.  Last Pain:  Vitals:   03/11/19 0621  TempSrc:   PainSc: 0-No pain      Patients Stated Pain Goal: 3 (15/61/53 7943)  Complications: No apparent anesthesia complications

## 2019-03-12 LAB — BASIC METABOLIC PANEL
Anion gap: 6 (ref 5–15)
BUN: 10 mg/dL (ref 8–23)
CO2: 25 mmol/L (ref 22–32)
Calcium: 8.8 mg/dL — ABNORMAL LOW (ref 8.9–10.3)
Chloride: 108 mmol/L (ref 98–111)
Creatinine, Ser: 0.9 mg/dL (ref 0.44–1.00)
GFR calc Af Amer: >60 mL/min (ref >60)
GFR calc non Af Amer: >60 mL/min (ref >60)
Glucose, Bld: 106 mg/dL — ABNORMAL HIGH (ref 70–99)
Potassium: 4.2 mmol/L (ref 3.5–5.1)
Sodium: 139 mmol/L (ref 135–145)

## 2019-03-12 LAB — HEMOGLOBIN AND HEMATOCRIT, BLOOD
HCT: 37.4 % (ref 36.0–46.0)
Hemoglobin: 12.3 g/dL (ref 12.0–15.0)

## 2019-03-12 NOTE — Progress Notes (Signed)
Orthopedics Progress Note  Subjective: Doing well this morning.  She feels a little tired.  The block is still working well.  Objective:  Vitals:   03/12/19 0103 03/12/19 0618  BP: (!) 110/48 (!) 114/54  Pulse: 70 66  Resp: 20 20  Temp: 98 F (36.7 C) 98.1 F (36.7 C)  SpO2: 95% 95%    General: Awake and alert  Musculoskeletal: right shoulder incision CDI, minimal swelling Neurovascularly intact  Lab Results  Component Value Date   WBC 4.9 03/09/2019   HGB 12.3 03/12/2019   HCT 37.4 03/12/2019   MCV 97.0 03/09/2019   PLT 166 03/09/2019       Component Value Date/Time   NA 139 03/12/2019 0253   NA 145 (H) 06/02/2018 1514   K 4.2 03/12/2019 0253   CL 108 03/12/2019 0253   CO2 25 03/12/2019 0253   GLUCOSE 106 (H) 03/12/2019 0253   BUN 10 03/12/2019 0253   BUN 18 06/02/2018 1514   CREATININE 0.90 03/12/2019 0253   CALCIUM 8.8 (L) 03/12/2019 0253   GFRNONAA >60 03/12/2019 0253   GFRAA >60 03/12/2019 0253    Lab Results  Component Value Date   INR 1.06 03/03/2016   INR 0.89 09/19/2010   INR 0.96 09/20/2009    Assessment/Plan: POD #1 s/p Procedure(s): REVERSE TOTAL SHOULDER ARTHROPLASTY Occupational therapy this AM  And then D/C home  [place]. Veverly Fells, MD 03/12/2019 8:16 AM

## 2019-03-12 NOTE — Progress Notes (Signed)
The pt was provided with d/c instructions. After discussing the pt's plan of care upon d/c home, the pt reported no further questions or concerns. Pt currently waiting for ride to arrive to d/c home.

## 2019-03-12 NOTE — Evaluation (Signed)
Occupational Therapy Evaluation Patient Details Name: Cassie White MRN: [id number] DOB: 1940-10-19 Today's Date: 03/12/2019    History of Present Illness 78 yo female s/p R reverseTSA with subscapularis repair PMH arthritis, diverticulosis, HTN, lumbar back pan, MVP, neuropathy, L shoulder surgery 2016, L TKA 2017,    Clinical Impression   Patient is s/p R reverse TSA surgery resulting in functional limitations due to the deficits listed below (see OT problem list). Pt requires min (A) for adls due to decrease balance. Pt needs min-mod cues for safety with R UE during adls. pt attempting to actively use R UE when sitting in chair ot weight bear to dress. Recommend 24/7 (A) initially with return home.  Patient will benefit from skilled OT acutely to increase independence and safety with ADLS to allow discharge home with family.     Follow Up Recommendations  Follow surgeon's recommendation for DC plan and follow-up therapies  - outpatient as pt progresses with recovery   Equipment Recommendations  None recommended by OT    Recommendations for Other Services       Precautions / Restrictions Precautions Precautions: Shoulder Type of Shoulder Precautions: conservative- AROM wrist hand elbow only Shoulder Interventions: Shoulder sling/immobilizer;At all times;Off for dressing/bathing/exercises Precaution Comments: shoulder protcol handout provided and reviewed for adls. Required Braces or Orthoses: Sling Restrictions Weight Bearing Restrictions: Yes RUE Weight Bearing: Non weight bearing      Mobility Bed Mobility               General bed mobility comments: oob in chair on arrival. pt expressed plan to sleep in recliner that is a lift chairt  Transfers Overall transfer level: Needs assistance   Transfers: Sit to/from Stand Sit to Stand: Min assist         General transfer comment: pt is able to power up but requires (A) for steady    Balance Overall balance  assessment: Mild deficits observed, not formally tested                                         ADL either performed or assessed with clinical judgement   ADL Overall ADL's : Needs assistance/impaired Eating/Feeding: Minimal assistance   Grooming: Minimal assistance;Sitting Grooming Details (indicate cue type and reason): recommend sitting decr fall risk Upper Body Bathing: Minimal assistance   Lower Body Bathing: Minimal assistance   Upper Body Dressing : Minimal assistance   Lower Body Dressing: Minimal assistance   Toilet Transfer: Minimal assistance             General ADL Comments: pt dressing for home and requires (A) for balance throughout. pt needs education to dress afffected side first then L side   Shoulder precautions ( handout provided): Educated patient on don doff brace with return demonstration,washing under arms with cloth, never to wash directly on incision site, avoid shoulder movement. positioning with pillows in chair for , sleeping positioning (recommend recliner if possible), pt educated on dressing care during bathing/ dressing.  Home exercise program as stated below (indicated by MD).   Vision Baseline Vision/History: Wears glasses Wears Glasses: At all times Patient Visual Report: No change from baseline       Perception     Praxis      Pertinent Vitals/Pain Pain Assessment: No/denies pain(blocked still working )     Hand Dominance Right   Extremity/Trunk Assessment Upper Extremity Assessment  Upper Extremity Assessment: RUE deficits/detail RUE Deficits / Details: aROM digits, wrist but requires PROM of elbow at this time. pt requires mod cues for avoid shoulder flexion RUE Sensation: decreased light touch RUE Coordination: decreased fine motor;decreased gross motor   Lower Extremity Assessment Lower Extremity Assessment: Overall WFL for tasks assessed   Cervical / Trunk Assessment Cervical / Trunk Assessment:  Kyphotic   Communication Communication Communication: No difficulties   Cognition Arousal/Alertness: Awake/alert Behavior During Therapy: WFL for tasks assessed/performed Overall Cognitive Status: Impaired/Different from baseline Area of Impairment: Memory                     Memory: Decreased recall of precautions         General Comments: pt requires cues for avoiding weight and use of R UE at the shoulder. pt with decr activation of R UE due to block still in place. pt able to verbalize but unable to demonstrate.    General Comments  dressing intact and dry at this time. educated on washing up with shoulder precautions    Exercises Exercises: Shoulder Shoulder Exercises Elbow Flexion: PROM;5 reps;Seated Wrist Flexion: AROM;5 reps;Seated Neck Flexion: AROM;5 reps Neck Lateral Flexion - Right: AROM;5 reps Neck Lateral Flexion - Left: AROM;5 reps   Shoulder Instructions Shoulder Instructions Donning/doffing shirt without moving shoulder: Minimal assistance Method for sponge bathing under operated UE: Minimal assistance Donning/doffing sling/immobilizer: Minimal assistance Correct positioning of sling/immobilizer: Minimal assistance ROM for elbow, wrist and digits of operated UE: Minimal assistance Sling wearing schedule (on at all times/off for ADL's): Minimal assistance Proper positioning of operated UE when showering: Minimal assistance Dressing change: Maximal assistance Positioning of UE while sleeping: Minimal assistance    Home Living Family/patient expects to be discharged to:: Private residence Living Arrangements: Alone Available Help at Discharge: Family Type of Home: House Home Access: Stairs to enter Technical brewer of Steps: 3   Home Layout: One level     Bathroom Shower/Tub: Hospital doctor Toilet: Handicapped height     Home Equipment: Environmental consultant - 2 wheels;Toilet riser   Additional Comments: daughter will assist initially  at home and pt reports that she is retired Therapist, sports. Pt has a granddaughter that can help as well       Prior Functioning/Environment Level of Independence: Independent                 OT Problem List: Decreased strength;Decreased activity tolerance;Impaired balance (sitting and/or standing);Decreased cognition;Decreased safety awareness;Decreased knowledge of precautions;Decreased knowledge of use of DME or AE;Impaired UE functional use;Obesity;Pain      OT Treatment/Interventions: Self-care/ADL training;Therapeutic exercise;Neuromuscular education;Energy conservation;DME and/or AE instruction;Manual therapy;Modalities;Therapeutic activities;Cognitive remediation/compensation;Patient/family education;Balance training    OT Goals(Current goals can be found in the care plan section) Acute Rehab OT Goals Patient Stated Goal: to return home with daughter helping -- i hope me getting dressed means they let me go OT Goal Formulation: With patient Time For Goal Achievement: 03/19/19 Potential to Achieve Goals: Good  OT Frequency: Min 3X/week   Barriers to D/C:            Co-evaluation              AM-PAC OT "6 Clicks" Daily Activity     Outcome Measure Help from another person eating meals?: A Little Help from another person taking care of personal grooming?: A Little Help from another person toileting, which includes using toliet, bedpan, or urinal?: A Little Help from another person bathing (including  washing, rinsing, drying)?: A Little Help from another person to put on and taking off regular upper body clothing?: A Little Help from another person to put on and taking off regular lower body clothing?: A Little 6 Click Score: 18   End of Session Nurse Communication: Mobility status;Precautions;Weight bearing status  Activity Tolerance: Patient tolerated treatment well Patient left: in chair;with call bell/phone within reach  OT Visit Diagnosis: Unsteadiness on feet (R26.81)                 Time: 1278-7183 OT Time Calculation (min): 19 min Charges:  OT General Charges $OT Visit: 1 Visit OT Evaluation $OT Eval Moderate Complexity: 1 Mod   Jeri Modena, OTR/L  Acute Rehabilitation Services Pager: [phone] Office: [phone] .   Jeri Modena 03/12/2019, 10:10 AM

## 2019-03-12 NOTE — Discharge Summary (Signed)
Orthopedic Discharge Summary        Physician Discharge Summary  Patient ID: Cassie White MRN: [id number] DOB/AGE: [birth year] 78 y.o.  Admit date: 03/11/2019 Discharge date: 03/12/2019   Procedures:  Procedure(s) (LRB): REVERSE TOTAL SHOULDER ARTHROPLASTY (Right)  Attending Physician:  Dr. Esmond Plants  Admission Diagnoses:  Right shoulder RCT arthropathy  Discharge Diagnoses:  same   Past Medical History:  Diagnosis Date   Abnormal mammogram of left breast    Likely benign microcalcifications--repeat L diag mammo 03/24/2017.  COMPLEX SCLEROSING LESION WITH CALCIFICATIONS --no sign of malignancy.  Excision recommended--pt has been referred to Dr. Donne Hazel, who recommended f/u mammo and this was normal 12/2017--consider rpt 1 yr per rad.   Arthritis    DJD, back and both hips   Chronic low back pain without sciatica    Lumbar spondylosis + scoliosis.  Summer 2017 Dr. Nelva Bush did ESI and pt got no relief.  Pt then saw Dr. Maia Petties [facility]: felt she had facet mediated pain; plan for B L345 MBB (??).   Diverticulosis    on colonoscopy 2004   GERD (gastroesophageal reflux disease)    Hx of cardiovascular stress test    Lexiscan Myoview (2/14):  Low risk, small ant defect likely breast attenuation, no ischemia, EF 69% (no change from 2010).     Hyperlipidemia    Hypertension    per pt   Idiopathic scoliosis of thoracolumbar region    Lumbar back pain    Mitral regurgitation    Echo (2/14):  EF 60-65%, Gr 1 DD, mild AI, mild bileaflet MVP, mod post directed MR, mild LAE, PASP 35.   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  Responding to gabapentin (Dr. Brett Fairy).   Osteoporosis, senile    stable at the left hip 2014-2015 (T-score -2.7 when she stopped bisphosphonate).  Took fosamax for 10 yrs.  Repeat DEXA 59mo later (09/2016) T-score -.3.1 (different machine, though).    PAF (paroxysmal atrial fibrillation) (HCC)    Dr. Martinique: Flecainide, metopr, apxiaban.  08/2017-->persistent a-fib w/RVR, flecainide increased and she converted back to NSR and felt better.  100mg  bid flecainide continued.   Peripheral neuropathy 2008   Idiopathic vs familial (motor>sensory) vs lumbar disc dz: gabapentin helpful   Shoulder pain left   RC surg 09/2014    PCP: Tammi Sou, MD   Discharged Condition: good  Hospital Course:  Patient underwent the above stated procedure on 03/11/2019. Patient tolerated the procedure well and brought to the recovery room in good condition and subsequently to the floor. Patient had an uncomplicated hospital course and was stable for discharge.   Disposition: Discharge disposition: 01-Home or Self Care      with follow up in 2 weeks   Follow-up Information    Netta Cedars, MD. Call in 2 weeks.   Specialty: Orthopedic Surgery Why: [phone] Contact information: [address] [place] [address] [phone]           Discharge Instructions    Call MD / Call 911   Complete by: As directed    If you experience chest pain or shortness of breath, CALL 911 and be transported to the hospital emergency room.  If you develope a fever above 101 F, pus (white drainage) or increased drainage or redness at the wound, or calf pain, call your surgeon's office.   Constipation Prevention   Complete by: As  directed    Drink plenty of fluids.  Prune juice may be helpful.  You may use a stool softener, such as Colace (over the counter) 100 mg twice a day.  Use MiraLax (over the counter) for constipation as needed.   Diet - low sodium heart healthy   Complete by: As directed    Increase activity slowly as tolerated   Complete by: As directed       Allergies as of 03/12/2019      Reactions   Sulfa Antibiotics Swelling   face   Tizanidine Other (See Comments)   delirium      Medication List    TAKE these  medications   Biotin 5000 MCG Caps Take 5,000 mcg by mouth daily.   CALCIUM 600 PO Take 600 mg by mouth 2 (two) times daily.   cyanocobalamin 1000 MCG/ML injection Commonly known as: (VITAMIN B-12) INJECT 1 ML (1,000 MCG TOTAL) INTO THE MUSCLE EVERY 30 (THIRTY) DAYS.   Eliquis 5 MG Tabs tablet Generic drug: apixaban TAKE 1 TABLET BY MOUTH TWICE A DAY What changed: how much to take   Fish Oil 1200 MG Caps Take 1,000 mg by mouth 2 (two) times daily.   flecainide 100 MG tablet Commonly known as: TAMBOCOR Take 1 tablet (100 mg total) by mouth 2 (two) times daily.   gabapentin 300 MG capsule Commonly known as: NEURONTIN TAKE 1 CAPSULE BY MOUTH AT LUNCH, 1 AT DINNER, AND 2 AT BEDTIME What changed: See the new instructions.   HYDROcodone-acetaminophen 5-325 MG tablet Commonly known as: NORCO/VICODIN Take 1 tablet by mouth every 8 (eight) hours.   metoprolol tartrate 25 MG tablet Commonly known as: LOPRESSOR TAKE 1 TABLET BY MOUTH TWICE A DAY   nitroGLYCERIN 0.4 MG SL tablet Commonly known as: Nitrostat Place 1 tablet (0.4 mg total) under the tongue every 5 (five) minutes as needed for chest pain.   pravastatin 40 MG tablet Commonly known as: PRAVACHOL Take 1 tablet (40 mg total) by mouth daily.   primidone 50 MG tablet Commonly known as: MYSOLINE Take 1 tablet (50 mg total) by mouth 2 (two) times a day. What changed: Another medication with the same name was changed. Make sure you understand how and when to take each.   primidone 50 MG tablet Commonly known as: MYSOLINE 1 tab po bid What changed:   how much to take  how to take this  when to take this         Signed: Augustin Schooling 03/12/2019, 8:19 AM  [facility] [address], [place], [place], [place] [address] Phone: [place]

## 2019-03-14 ENCOUNTER — Encounter (HOSPITAL_COMMUNITY): Payer: Self-pay | Admitting: Orthopedic Surgery

## 2019-03-19 ENCOUNTER — Other Ambulatory Visit: Payer: Self-pay | Admitting: Neurology

## 2019-03-19 DIAGNOSIS — G609 Hereditary and idiopathic neuropathy, unspecified: Secondary | ICD-10-CM

## 2019-03-24 DIAGNOSIS — Z96611 Presence of right artificial shoulder joint: Secondary | ICD-10-CM | POA: Diagnosis not present

## 2019-03-24 DIAGNOSIS — Z471 Aftercare following joint replacement surgery: Secondary | ICD-10-CM | POA: Diagnosis not present

## 2019-03-25 ENCOUNTER — Other Ambulatory Visit: Payer: Self-pay | Admitting: Pharmacist Clinician (PhC)/ Clinical Pharmacy Specialist

## 2019-03-25 DIAGNOSIS — Z96651 Presence of right artificial knee joint: Secondary | ICD-10-CM | POA: Diagnosis not present

## 2019-03-25 DIAGNOSIS — Z96653 Presence of artificial knee joint, bilateral: Secondary | ICD-10-CM | POA: Diagnosis not present

## 2019-03-25 DIAGNOSIS — Z471 Aftercare following joint replacement surgery: Secondary | ICD-10-CM | POA: Diagnosis not present

## 2019-03-25 MED ORDER — APIXABAN 5 MG PO TABS: 5.0000 mg | ORAL_TABLET | Freq: Two times a day (BID) | ORAL | 1 refills | Status: DC

## 2019-03-25 NOTE — Telephone Encounter (Signed)
76F 68 kg, SCr 0.9 (03/2019), LOV PJ 09/2018  In refilling rx, interaction came up with primidone.  Reviewed chart.  Patient saw Dr. Domenic Polite in June and was given trial of primidone 25 mg bid for tremors.   Has increased to 50 mg bid since then.    Called patient.  Explained interaction and advised that she would be best to stop one of the medications.  If she felt the primidone was beneficial we would need to switch her to warfarin.  Patient notes that she doesn't care for the primidone, not sure it's helping and is willing to discontinue.  Advised that she take primidone once daily for 3 days then discontinue.  Patient voiced understanding.

## 2019-04-05 ENCOUNTER — Encounter: Payer: Self-pay | Admitting: Neurology

## 2019-04-05 ENCOUNTER — Other Ambulatory Visit: Payer: Self-pay

## 2019-04-05 ENCOUNTER — Encounter: Payer: Self-pay | Admitting: Family Medicine

## 2019-04-05 ENCOUNTER — Ambulatory Visit (INDEPENDENT_AMBULATORY_CARE_PROVIDER_SITE_OTHER): Payer: Medicare Other | Admitting: Family Medicine

## 2019-04-05 VITALS — BP 114/70 | HR 66 | Temp 97.6°F | Resp 16 | Ht 61.0 in | Wt 145.0 lb

## 2019-04-05 DIAGNOSIS — R5381 Other malaise: Secondary | ICD-10-CM | POA: Diagnosis not present

## 2019-04-05 DIAGNOSIS — M79621 Pain in right upper arm: Secondary | ICD-10-CM

## 2019-04-05 DIAGNOSIS — R2681 Unsteadiness on feet: Secondary | ICD-10-CM

## 2019-04-05 DIAGNOSIS — R251 Tremor, unspecified: Secondary | ICD-10-CM

## 2019-04-05 DIAGNOSIS — M25511 Pain in right shoulder: Secondary | ICD-10-CM | POA: Diagnosis not present

## 2019-04-05 DIAGNOSIS — W19XXXD Unspecified fall, subsequent encounter: Secondary | ICD-10-CM

## 2019-04-05 DIAGNOSIS — S2001XA Contusion of right breast, initial encounter: Secondary | ICD-10-CM | POA: Diagnosis not present

## 2019-04-05 DIAGNOSIS — Z96611 Presence of right artificial shoulder joint: Secondary | ICD-10-CM

## 2019-04-05 NOTE — Progress Notes (Signed)
OFFICE VISIT  04/05/2019   CC:  Chief Complaint  Patient presents with  . knot on breast    right   HPI:    Patient is a 78 y.o. Caucasian female who presents accompanied by her daughter for "knot on right breast". (Of note, she recently had a R shoulder reverse total arthroplasty on 03/11/19).  She fell approx 10 d/a onto R side of body.  She was walking on uneven surface and foot caught the threshhold and she fell and hit R shoulder and breast.  Has large knot and bruise on R breast since this happened, getting a bit better last 1-2d.  Lots of pain in R upper arm.   No pain in breast.  She saw ortho the day she fell b/c R knee was hurting as well, got x-ray and it showed no acute problem.  The pain in knee resolved after 2d.  Right upper arm hurting a lot now. Has appt with ortho tomorrow for f/u R shoulder surgery.  Has unstable gait chronically, worse since surgery, generalized weakness and needs walker to ambulate.  No pain in legs and no focal weakness or paresthesias.  Of note, her resting tremor had been signif improved on primidone but pt says her cardiologist told her to stop it b/c of her being on eliquis.  Tremor much worse off primidone now.  ROS: no CP, no SOB, no wheezing, no cough, no dizziness, no HAs, no rashes, no melena/hematochezia.  No polyuria or polydipsia.  No myalgias.    Past Medical History:  Diagnosis Date  . Abnormal mammogram of left breast    Likely benign microcalcifications--repeat L diag mammo 03/24/2017.  COMPLEX SCLEROSING LESION WITH CALCIFICATIONS --no sign of malignancy.  Excision recommended--pt has been referred to Dr. Donne Hazel, who recommended f/u mammo and this was normal 12/2017--consider rpt 1 yr per rad.  . Arthritis    DJD, back and both hips  . Chronic low back pain without sciatica    Lumbar spondylosis + scoliosis.  Summer 2017 Dr. Nelva Bush did ESI and pt got no relief.  Pt then saw Dr. Maia Petties [facility]: felt she had facet  mediated pain; plan for B L345 MBB (??).  Marland Kitchen Diverticulosis    on colonoscopy 2004  . GERD (gastroesophageal reflux disease)   . Hx of cardiovascular stress test    Lexiscan Myoview (2/14):  Low risk, small ant defect likely breast attenuation, no ischemia, EF 69% (no change from 2010).    . Hyperlipidemia   . Hypertension    per pt  . Idiopathic scoliosis of thoracolumbar region   . Lumbar back pain   . Mitral regurgitation    Echo (2/14):  EF 60-65%, Gr 1 DD, mild AI, mild bileaflet MVP, mod post directed MR, mild LAE, PASP 35.  Marland Kitchen MVP (mitral valve prolapse)   . 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  Responding to gabapentin (Dr. Brett Fairy).  . Osteoporosis, senile    stable at the left hip 2014-2015 (T-score -2.7 when she stopped bisphosphonate).  Took fosamax for 10 yrs.  Repeat DEXA 25mo later (09/2016) T-score -.3.1 (different machine, though).  Marland Kitchen PAF (paroxysmal atrial fibrillation) (HCC)    Dr. Martinique: Flecainide, metopr, apxiaban.  08/2017-->persistent a-fib w/RVR, flecainide increased and she converted back to NSR and felt better.  100mg  bid flecainide continued.  . Peripheral neuropathy 2008   Idiopathic vs familial (motor>sensory) vs lumbar disc dz: gabapentin helpful  . Shoulder pain  left   RC surg 09/2014    Past Surgical History:  Procedure Laterality Date  . BREAST BIOPSY  04/10/2017   Left breast core needle biopsy of calcifications.  COMPLEX SCLEROSING LESION WITH CALCIFICATIONS --no sign of malignancy.  Excision recommended--pt has been referred to Dr. Donne Hazel, who recommended rpt mammo and this was NORMAL 12/2017.  Marland Kitchen BREAST EXCISIONAL BIOPSY Right 2010   Benign  . BREAST SURGERY  Nov 2010   Benign biopsy, right  . CHOLECYSTECTOMY  03/2010   Dr.Gross  . COLONOSCOPY  10/03/2002   No polyps.  Repeat 10 yrs recommended but pt declines.  Pt declined cologuard 08/2016 but changed her mind and this test was NEG on  03/23/17.  . COMBINED HYSTERECTOMY VAGINAL / OOPHORECTOMY / A&P REPAIR  0000000   uncertain if ovaries were removed or not  . CYSTOCELE REPAIR    . Lumpectomy  06/2009   "Fatty Necrosis"  . PFT's  2014   NORMAL  . REVERSE SHOULDER ARTHROPLASTY Right 03/11/2019   Procedure: REVERSE TOTAL SHOULDER ARTHROPLASTY;  Surgeon: Netta Cedars, MD;  Location: [facility];  Service: Orthopedics;  Laterality: Right;  Intersclene block  . ROTATOR CUFF REPAIR Left 10/02/14   Dr. Veverly Fells  . TONSILLECTOMY    . TOTAL KNEE ARTHROPLASTY  09/2009   Right ;Dr Alvan Dame  . TOTAL KNEE ARTHROPLASTY Left 03/10/2016   Procedure: TOTAL KNEE ARTHROPLASTY;  Surgeon: Paralee Cancel, MD;  Location: [facility];  Service: Orthopedics;  Laterality: Left;  . TRANSTHORACIC ECHOCARDIOGRAM  09/2012; 09/27/15   2014: EF 60-65%, Gr 1 DD, mild AI, mild bileaflet MVP, mod post directed MR, mild LAE, PASP 35.  Repeat 2017: EF 55-60%, mild AR, mild MVP and mild MV regurg.  Marland Kitchen VAGINAL HYSTERECTOMY     For Uterine Deviation     Outpatient Medications Prior to Visit  Medication Sig Dispense Refill  . apixaban (ELIQUIS) 5 MG TABS tablet Take 1 tablet (5 mg total) by mouth 2 (two) times daily. 180 tablet 1  . Biotin 5000 MCG CAPS Take 5,000 mcg by mouth daily.    . Calcium Carbonate (CALCIUM 600 PO) Take 600 mg by mouth 2 (two) times daily.     . cyanocobalamin (,VITAMIN B-12,) 1000 MCG/ML injection INJECT 1 ML (1,000 MCG TOTAL) INTO THE MUSCLE EVERY 30 (THIRTY) DAYS. 10 mL 1  . flecainide (TAMBOCOR) 100 MG tablet Take 1 tablet (100 mg total) by mouth 2 (two) times daily. 180 tablet 3  . gabapentin (NEURONTIN) 300 MG capsule TAKE 1 CAPSULE BY MOUTH AT LUNCH, 1 AT DINNER, AND 2 AT BEDTIME 360 capsule 0  . HYDROcodone-acetaminophen (NORCO/VICODIN) 5-325 MG tablet Take 1 tablet by mouth every 8 (eight) hours. 15 tablet 0  . metoprolol tartrate (LOPRESSOR) 25 MG tablet TAKE 1 TABLET BY MOUTH TWICE A DAY (Patient taking differently: Take 25 mg by mouth 2 (two) times  daily. ) 180 tablet 1  . nitroGLYCERIN (NITROSTAT) 0.4 MG SL tablet Place 1 tablet (0.4 mg total) under the tongue every 5 (five) minutes as needed for chest pain. 25 tablet 5  . Omega-3 Fatty Acids (FISH OIL) 1200 MG CAPS Take 1,000 mg by mouth 2 (two) times daily.     . pravastatin (PRAVACHOL) 40 MG tablet Take 1 tablet (40 mg total) by mouth daily. 90 tablet 3  . primidone (MYSOLINE) 50 MG tablet primidone 50 mg tablet     No facility-administered medications prior to visit.     Allergies  Allergen Reactions  .  Sulfa Antibiotics Swelling    face  . Tizanidine Other (See Comments)    delirium    ROS As per HPI  PE: Blood pressure 114/70, pulse 66, temperature 97.6 F (36.4 C), temperature source Temporal, resp. rate 16, height 5\' 1"  (1.549 m), weight 145 lb (65.8 kg), SpO2 93 %. Gen: Alert, well appearing.  Patient is oriented to person, place, time, and situation. AFFECT: pleasant, lucid thought and speech. Right breast with large, firm mass on R side and extending medial to behind nipple, feels about baseball-sized.  Nontender.  No erythema or warmth.  Nipple is normal. Large ecchymotic region of skin enveloping most of R breast. R shoulder with ROM limited significantly, particularly ABduction and ER/IR. Minimal TTP about the acromion.  No humerus or clavicle tenderness. No bruising of arm or shoulder.   LABS:    Chemistry      Component Value Date/Time   NA 139 03/12/2019 0253   NA 145 (H) 06/02/2018 1514   K 4.2 03/12/2019 0253   CL 108 03/12/2019 0253   CO2 25 03/12/2019 0253   BUN 10 03/12/2019 0253   BUN 18 06/02/2018 1514   CREATININE 0.90 03/12/2019 0253      Component Value Date/Time   CALCIUM 8.8 (L) 03/12/2019 0253   ALKPHOS 64 09/04/2017 1135   AST 16 09/04/2017 1135   ALT 14 09/04/2017 1135   BILITOT 0.8 09/04/2017 1135     Lab Results  Component Value Date   WBC 4.9 03/09/2019   HGB 12.3 03/12/2019   HCT 37.4 03/12/2019   MCV 97.0 03/09/2019    PLT 166 03/09/2019    IMPRESSION AND PLAN:  1) Mechanical fall: she needs PT for strengthening/balance.  She has ortho f/u tomorrow for her shoulder. I'll get her in with PT if they do not.  2) R shoulder/upper arm pain s/p fall (recent R total shoulder arthroplasty prior to fall): I think she has a traumatic RC injury.  I don't think she has a fracture or hardware displacement problem. Has ortho f/u tomorrow.  3) Right breast hematoma, large: watchful waiting/obs, told pt it could be months before it resolves. She still needs to take eliquis for chronic A-fib.  4) Essential tremor: was improved on primidone but had to d/c b/c of potential interaction with eliquis. Refer to neurologist.  An After Visit Summary was printed and given to the patient.  FOLLOW UP: No follow-ups on file.  Signed:  Crissie Sickles, MD           04/05/2019

## 2019-04-06 DIAGNOSIS — M25511 Pain in right shoulder: Secondary | ICD-10-CM | POA: Diagnosis not present

## 2019-04-15 NOTE — Progress Notes (Signed)
Cassie White was seen today in the movement disorders clinic for neurologic consultation at the request of [name], Adrian Blackwater, MD.  Daughter came in toward the end of the visit and supplemented the history.  The consultation is for the evaluation of tremor.  Multiple records have been reviewed, including those from Dr. Brett Fairy as well as from Dr. Anitra Lauth.  Tremor has been noted in the medical records at least since September 04, 2017.  She initially was not on any medication.  She initially attributed tremor to gabapentin, but that was weaned off and she continued to have tremor.  She was started on primidone on January 26, 2019.  She states that she did not think it was helpful, but her daughter states that she did think it was somewhat helpful.  she was ultimately taken off of that by her cardiologist because of the fact that she was on Eliquis.  She found that tremor got much worse after the primidone was discontinued.  She is already on metoprolol.  Tremor: Yes.     How long has it been going on? Few years  At rest or with activation?  Resting, esp at night  When is it noted the most?  Mostly at night at rest or sitting down in the day  Fam hx of tremor?  No.  Located where?  R more than L  Affected by caffeine:  No. (1 dr. Malachi Bonds per day)  Affected by alcohol:  Doesn't drink   Affected by stress:  No.  Affected by fatigue:  No.  Spills soup if on spoon:  Yes.    Spills glass of liquid if full:  Yes.    Affects ADL's (tying shoes, brushing teeth, etc):  Yes.    Tremor inducing meds:  No.  Other Specific Symptoms:  Voice: little softer Sleep: sleeps well  Vivid Dreams:  No.  Acting out dreams:  No. Wet Pillows: No. Postural symptoms:  Yes.  , "a little"  Falls?  Yes.  , last one about 3 weeks ago after shoulder surgery, which was only 5 weeks ago Bradykinesia symptoms: shuffling gait, slow movements and difficulty getting out of a chair (only occasionally shuffles) Loss of smell:  No.  Loss of taste:  No. Urinary Incontinence:  No. Difficulty Swallowing:  No. Handwriting, micrographia: Yes.   Depression:  No. Memory changes:  Yes.  , mild with names.  Lives alone.  Manages own medications.  Drove before shoulder surgery but hasn't resumed yet Hallucinations:  No.  visual distortions: No. N/V:  No. Lightheaded:  No.  Syncope: No. Diplopia:  No. Dyskinesia:  No.  Had CT brain in 05/2018.  I reviewed this.  It was unremarkable.  PREVIOUS MEDICATIONS: primidone (on Eliquis); gabapentin; metoprolol  ALLERGIES:   Allergies  Allergen Reactions  . Sulfa Antibiotics Swelling    face  . Tizanidine Other (See Comments)    delirium    CURRENT MEDICATIONS:  Current Outpatient Medications  Medication Instructions  . apixaban (ELIQUIS) 5 mg, Oral, 2 times daily  . Biotin 5,000 mcg, Daily  . Calcium Carbonate (CALCIUM 600 PO) 600 mg, Oral, 2 times daily  . cyanocobalamin ((VITAMIN B-12)) 1,000 mcg, Intramuscular, Every 30 days  . Fexofenadine HCl (MUCINEX ALLERGY PO) Oral  . Fish Oil 1,000 mg, Oral, 2 times daily  . flecainide (TAMBOCOR) 100 mg, Oral, 2 times daily  . gabapentin (NEURONTIN) 300 MG capsule TAKE 1 CAPSULE BY MOUTH AT LUNCH, 1 AT DINNER, AND 2 AT  BEDTIME  . HYDROcodone-acetaminophen (NORCO/VICODIN) 5-325 MG tablet 1 tablet, Oral, Every 8 hours  . metoprolol tartrate (LOPRESSOR) 25 MG tablet TAKE 1 TABLET BY MOUTH TWICE A DAY  . nitroGLYCERIN (NITROSTAT) 0.4 mg, Sublingual, Every 5 min PRN  . pravastatin (PRAVACHOL) 40 mg, Oral, Daily  . primidone (MYSOLINE) 50 mg, 2 times daily    PAST MEDICAL HISTORY:   Past Medical History:  Diagnosis Date  . Abnormal mammogram of left breast    Likely benign microcalcifications--repeat L diag mammo 03/24/2017.  COMPLEX SCLEROSING LESION WITH CALCIFICATIONS --no sign of malignancy.  Excision recommended--pt has been referred to Dr. Donne Hazel, who recommended f/u mammo and this was normal 12/2017--consider rpt 1 yr  per rad.  . Arthritis    DJD, back and both hips  . Chronic low back pain without sciatica    Lumbar spondylosis + scoliosis.  Summer 2017 Dr. Nelva Bush did ESI and pt got no relief.  Pt then saw Dr. Maia Petties [facility]: felt she had facet mediated pain; plan for B L345 MBB (??).  Marland Kitchen Diverticulosis    on colonoscopy 2004  . GERD (gastroesophageal reflux disease)   . Hx of cardiovascular stress test    Lexiscan Myoview (2/14):  Low risk, small ant defect likely breast attenuation, no ischemia, EF 69% (no change from 2010).    . Hyperlipidemia   . Hypertension    per pt  . Idiopathic scoliosis of thoracolumbar region   . Lumbar back pain   . Mitral regurgitation    Echo (2/14):  EF 60-65%, Gr 1 DD, mild AI, mild bileaflet MVP, mod post directed MR, mild LAE, PASP 35.  Marland Kitchen MVP (mitral valve prolapse)   . 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  Responding to gabapentin (Dr. Brett Fairy).  . Osteoporosis, senile    stable at the left hip 2014-2015 (T-score -2.7 when she stopped bisphosphonate).  Took fosamax for 10 yrs.  Repeat DEXA 39molater (09/2016) T-score -.3.1 (different machine, though).  .Marland KitchenPAF (paroxysmal atrial fibrillation) (HCC)    Dr. JMartinique Flecainide, metopr, apxiaban.  08/2017-->persistent a-fib w/RVR, flecainide increased and she converted back to NSR and felt better.  1053mbid flecainide continued.  . Peripheral neuropathy 2008   Idiopathic vs familial (motor>sensory) vs lumbar disc dz: gabapentin helpful  . Shoulder pain left   RC surg 09/2014    PAST SURGICAL HISTORY:   Past Surgical History:  Procedure Laterality Date  . BREAST BIOPSY  04/10/2017   Left breast core needle biopsy of calcifications.  COMPLEX SCLEROSING LESION WITH CALCIFICATIONS --no sign of malignancy.  Excision recommended--pt has been referred to Dr. WaDonne Hazelwho recommended rpt mammo and this was NORMAL 12/2017.  . Marland KitchenREAST EXCISIONAL BIOPSY  Right 2010   Benign  . BREAST SURGERY  Nov 2010   Benign biopsy, right  . CHOLECYSTECTOMY  03/2010   Dr.Gross  . COLONOSCOPY  10/03/2002   No polyps.  Repeat 10 yrs recommended but pt declines.  Pt declined cologuard 08/2016 but changed her mind and this test was NEG on 03/23/17.  . COMBINED HYSTERECTOMY VAGINAL / OOPHORECTOMY / A&P REPAIR  194098 uncertain if ovaries were removed or not  . CYSTOCELE REPAIR    . Lumpectomy  06/2009   "Fatty Necrosis"  . PFT's  2014   NORMAL  . REVERSE SHOULDER ARTHROPLASTY Right 03/11/2019   Procedure: REVERSE TOTAL SHOULDER ARTHROPLASTY;  Surgeon: NoNetta CedarsMD;  Location: [facility];  Service:  Orthopedics;  Laterality: Right;  Intersclene block  . ROTATOR CUFF REPAIR Left 10/02/14   Dr. Veverly Fells  . TONSILLECTOMY    . TOTAL KNEE ARTHROPLASTY  09/2009   Right ;Dr Alvan Dame  . TOTAL KNEE ARTHROPLASTY Left 03/10/2016   Procedure: TOTAL KNEE ARTHROPLASTY;  Surgeon: Paralee Cancel, MD;  Location: [facility];  Service: Orthopedics;  Laterality: Left;  . TRANSTHORACIC ECHOCARDIOGRAM  09/2012; 09/27/15   2014: EF 60-65%, Gr 1 DD, mild AI, mild bileaflet MVP, mod post directed MR, mild LAE, PASP 35.  Repeat 2017: EF 55-60%, mild AR, mild MVP and mild MV regurg.  Marland Kitchen VAGINAL HYSTERECTOMY     For Uterine Deviation     SOCIAL HISTORY:   Social History   Socioeconomic History  . Marital status: Widowed    Spouse name: Not on file  . Number of children: 3  . Years of education: HS grad  . Highest education level: High school graduate  Occupational History  . 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 Financial resource strain: Not on file  . Food insecurity    Worry: Not on file    Inability: Not on file  . Transportation needs    Medical: Not on file    Non-medical: Not on file  Tobacco Use  . Smoking status: Never Smoker  . Smokeless tobacco: Never Used  Substance and Sexual Activity  . Alcohol use: No    Alcohol/week: 0.0 standard drinks  .  Drug use: No  . Sexual activity: Never    Birth control/protection: Post-menopausal  Lifestyle  . Physical activity    Days per week: Not on file    Minutes per session: Not on file  . Stress: Not on file  Relationships  . Social Herbalist on phone: Not on file    Gets together: Not on file    Attends religious service: Not on file    Active member of club or organization: Not on file    Attends meetings of clubs or organizations: Not on file    Relationship status: Not on file  . Intimate partner violence    Fear of current or ex partner: Not on file    Emotionally abused: Not on file    Physically abused: Not on file    Forced sexual activity: Not on file  Other Topics Concern  . Not on file  Social History Narrative   Widow, has 3 children, 4 grandchildren.   Orig from [place].   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   Caffiene, 1 cup daily avg.   No tob, no alc, no drugs.   Exercise: walking, limited by bilat hip pain.    FAMILY HISTORY:   Family Status  Relation Name Status  . Father  Deceased  . Mother  Deceased at age 73  . Son  Alive  . Brother x2 Deceased  . Son  Alive  . Daughter  Alive  . Neg Hx  (Not Specified)    ROS:  Review of Systems  Constitutional: Negative.   HENT: Negative.   Eyes: Negative.   Cardiovascular: Negative.   Gastrointestinal: Negative.   Genitourinary: Negative.   Musculoskeletal: Positive for joint pain (R shoulder - recent surgery).  Skin: Negative.   Endo/Heme/Allergies: Negative.   Psychiatric/Behavioral: Negative.     PHYSICAL EXAMINATION:    VITALS:   Vitals:   04/21/19 0833  BP: 131/74  Pulse: (!) 57  SpO2: 97%  Weight: 150 lb 3.2 oz (68.1 kg)  Height: '5\' 2"'  (1.575 m)    GEN:  The patient appears stated age and is in NAD. HEENT:  Normocephalic, atraumatic.  The mucous membranes are moist. The superficial temporal arteries are without ropiness or tenderness. CV:  Bradycardic. Lungs:  CTAB Neck/HEME:  There are no carotid bruits bilaterally.  Neurological examination:  Orientation: The patient is alert and oriented x3. Fund of knowledge is appropriate.  Recent and remote memory are intact.  Attention and concentration are normal.    Able to name objects and repeat phrases. Cranial nerves: There is good facial symmetry except left pseudoptosis from lid lag. There is facial hypomimia.  The visual fields are full to confrontational testing. The speech is fluent and clear. Soft palate rises symmetrically and there is no tongue deviation. Hearing is intact to conversational tone. Sensation: Sensation is intact to light and pinprick throughout (facial, trunk, extremities). Vibration is intact at the bilateral big toe. There is no extinction with double simultaneous stimulation. There is no sensory dermatomal level identified. Motor: Strength is 5/5 in the bilateral upper and lower extremities.   Shoulder shrug is equal and symmetric.  There is no pronator drift. Deep tendon reflexes: Deep tendon reflexes are 2/4 at the bilateral biceps, triceps, brachioradialis, right patella and absent at the left patella and achilles. Plantar responses are downgoing bilaterally.  Movement examination: Tone: There is normal tone in the UE/LE Abnormal movements: there is RUE resting tremor Coordination:  There is  decremation with RAM's, with hand opening and closing on the right and with finger taps on the right Gait and Station: The patient has mild difficulty arising out of a deep-seated chair without the use of the hands. The patient's stride length is decreased.  She shuffles and is unstable with festination.  She has no arm swing and re-emergent tremor on the right.      Chemistry      Component Value Date/Time   NA 139 03/12/2019 0253   NA 145 (H) 06/02/2018 1514   K 4.2 03/12/2019 0253   CL 108 03/12/2019 0253   CO2 25 03/12/2019 0253   BUN 10 03/12/2019 0253    BUN 18 06/02/2018 1514   CREATININE 0.90 03/12/2019 0253      Component Value Date/Time   CALCIUM 8.8 (L) 03/12/2019 0253   ALKPHOS 64 09/04/2017 1135   AST 16 09/04/2017 1135   ALT 14 09/04/2017 1135   BILITOT 0.8 09/04/2017 1135     Lab Results  Component Value Date   TSH 1.63 09/24/2015     ASSESSMENT/PLAN:  1.  Parkinsonism.  I suspect that this does represent idiopathic Parkinson's disease.  The patient has tremor, bradykinesia, moderate postural instability  -We discussed the diagnosis as well as pathophysiology of the disease.  We discussed treatment options as well as prognostic indicators.  Patient education was provided.  -We discussed that it used to be thought that levodopa would increase risk of melanoma but now it is believed that Parkinsons itself likely increases risk of melanoma. she is to get regular skin checks.  -Greater than 50% of the 60 minute visit was spent in counseling answering questions and talking about what to expect now as well as in the future.  We talked about medication options as well as potential future surgical options.  We talked about safety in the home.  -We decided to add carbidopa/levodopa 25/100.  1/2 tab tid x 1 wk, then  1/2 in am & noon & 1 at night for a week, then 1/2 in am &1 at noon &night for a week, then 1 po tid.  Risks, benefits, side effects and alternative therapies were discussed.  The opportunity to ask questions was given and they were answered to the best of my ability.  The patient expressed understanding and willingness to follow the outlined treatment protocols.  -I will refer the patient for physical therapy.  I was hoping to refer them for the multidisciplinary Parkinson's therapy at the neuro rehab center, but they live close to [facility] and would prefer to stay there.  I do not think that the medicine group has speech therapy.  -We discussed community resources in the area including patient support groups and community  exercise programs for PD and pt education was provided to the patient.  -They met with my social worker today.  -We will check her TSH.  -Patient has been on primidone in the past, but this was discontinued because of the interaction with Eliquis.  2.  Follow up is anticipated in the next 4-6 months, sooner should new neurologic issues arise.    Cc:  Tammi Sou, MD

## 2019-04-21 ENCOUNTER — Ambulatory Visit (INDEPENDENT_AMBULATORY_CARE_PROVIDER_SITE_OTHER): Payer: Medicare Other | Admitting: Neurology

## 2019-04-21 ENCOUNTER — Telehealth: Payer: Self-pay

## 2019-04-21 ENCOUNTER — Encounter: Payer: Self-pay | Admitting: Neurology

## 2019-04-21 ENCOUNTER — Other Ambulatory Visit (INDEPENDENT_AMBULATORY_CARE_PROVIDER_SITE_OTHER): Payer: Medicare Other

## 2019-04-21 ENCOUNTER — Other Ambulatory Visit: Payer: Self-pay

## 2019-04-21 VITALS — BP 131/74 | HR 57 | Ht 62.0 in | Wt 150.2 lb

## 2019-04-21 DIAGNOSIS — G250 Essential tremor: Secondary | ICD-10-CM

## 2019-04-21 DIAGNOSIS — G20 Parkinson's disease: Secondary | ICD-10-CM | POA: Diagnosis not present

## 2019-04-21 LAB — TSH: TSH: 1.32 u[IU]/mL (ref 0.35–4.50)

## 2019-04-21 MED ORDER — CARBIDOPA-LEVODOPA 25-100 MG PO TABS: 1.0000 | ORAL_TABLET | Freq: Three times a day (TID) | ORAL | 1 refills | Status: DC

## 2019-04-21 NOTE — Telephone Encounter (Signed)
-----   Message from [place], DO sent at 04/21/2019 12:58 PM EDT ----- I have reviewed her tsh which are normal or stable. Please inform the patient.

## 2019-04-21 NOTE — Telephone Encounter (Signed)
Spoke with patient she was made aware of results and understands

## 2019-04-21 NOTE — Patient Instructions (Addendum)
Start Carbidopa Levodopa as follows:  Take 1/2 tablet three times daily, at least 30 minutes before meals, for one week  Then take 1/2 tablet in the morning, 1/2 tablet in the afternoon, 1 tablet in the evening, at least 30 minutes before meals, for one week  Then take 1/2 tablet in the morning, 1 tablet in the afternoon, 1 tablet in the evening, at least 30 minutes before meals, for one week  Then take 1 tablet three times daily at 7am/11am/4pm, at least 30 minutes before meals   As a reminder, carbidopa/levodopa can be taken at the same time as a carbohydrate, but we like to have you take your pill either 30 minutes before a protein source or 1 hour after as protein can interfere with carbidopa/levodopa absorption.  The physicians and staff at [facility] are committed to providing excellent care. You may receive a survey requesting feedback about your experience at our office. We strive to receive "very good" responses to the survey questions. If you feel that your experience would prevent you from giving the office a "very good " response, please contact our office to try to remedy the situation. We may be reached at [phone]. Thank you for taking the time out of your busy day to complete the survey.  ~ Your provider has requested that you have labwork completed today. Please go to [facility] ([id number]) on the second floor of this building before leaving the office today. You do not need to check in. If you are not called within 15 minutes please check with the front desk.    ~ A referral has been place for Physical Therapy in [place] someone will be calling you to schedule visits.

## 2019-04-25 DIAGNOSIS — Z5189 Encounter for other specified aftercare: Secondary | ICD-10-CM | POA: Insufficient documentation

## 2019-04-26 ENCOUNTER — Ambulatory Visit (INDEPENDENT_AMBULATORY_CARE_PROVIDER_SITE_OTHER): Payer: Medicare Other

## 2019-04-26 ENCOUNTER — Other Ambulatory Visit: Payer: Self-pay

## 2019-04-26 DIAGNOSIS — Z23 Encounter for immunization: Secondary | ICD-10-CM

## 2019-05-06 ENCOUNTER — Other Ambulatory Visit: Payer: Self-pay

## 2019-05-06 ENCOUNTER — Encounter: Payer: Self-pay | Admitting: Physical Therapy

## 2019-05-06 ENCOUNTER — Ambulatory Visit: Payer: Medicare Other | Attending: Neurology | Admitting: Physical Therapy

## 2019-05-06 DIAGNOSIS — R2681 Unsteadiness on feet: Secondary | ICD-10-CM | POA: Insufficient documentation

## 2019-05-06 DIAGNOSIS — R296 Repeated falls: Secondary | ICD-10-CM | POA: Insufficient documentation

## 2019-05-06 DIAGNOSIS — M6281 Muscle weakness (generalized): Secondary | ICD-10-CM | POA: Diagnosis not present

## 2019-05-06 NOTE — Therapy (Signed)
[facility] [place], [place], [address] Phone: [phone]   Fax:  [phone]  Physical Therapy Evaluation  Patient Details  Name: Cassie White MRN: [id number] Date of Birth: 1941-06-10 Referring Provider (PT): Alonza Bogus, DO   Encounter Date: 05/06/2019  PT End of Session - 05/06/19 1300    Visit Number  1    Number of Visits  12    Date for PT Re-Evaluation  06/24/19    Authorization Type  Progress note at 10th visit    PT Start Time  0945    PT Stop Time  1026    PT Time Calculation (min)  41 min    Activity Tolerance  Patient tolerated treatment well    Behavior During Therapy  [facility] for tasks assessed/performed       Past Medical History:  Diagnosis Date  . Abnormal mammogram of left breast    Likely benign microcalcifications--repeat L diag mammo 03/24/2017.  COMPLEX SCLEROSING LESION WITH CALCIFICATIONS --no sign of malignancy.  Excision recommended--pt has been referred to Dr. Donne Hazel, who recommended f/u mammo and this was normal 12/2017--consider rpt 1 yr per rad.  . Arthritis    DJD, back and both hips  . Chronic low back pain without sciatica    Lumbar spondylosis + scoliosis.  Summer 2017 Dr. Nelva Bush did ESI and pt got no relief.  Pt then saw Dr. Maia Petties [facility]: felt she had facet mediated pain; plan for B L345 MBB (??).  Marland Kitchen Diverticulosis    on colonoscopy 2004  . GERD (gastroesophageal reflux disease)   . Hx of cardiovascular stress test    Lexiscan Myoview (2/14):  Low risk, small ant defect likely breast attenuation, no ischemia, EF 69% (no change from 2010).    . Hyperlipidemia   . Hypertension    per pt  . Idiopathic scoliosis of thoracolumbar region   . Lumbar back pain   . Mitral regurgitation    Echo (2/14):  EF 60-65%, Gr 1 DD, mild AI, mild bileaflet MVP, mod post directed MR, mild LAE, PASP 35.  Marland Kitchen MVP (mitral valve prolapse)   . 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  Responding to gabapentin (Dr. Brett Fairy).  . Osteoporosis, senile    stable at the left hip 2014-2015 (T-score -2.7 when she stopped bisphosphonate).  Took fosamax for 10 yrs.  Repeat DEXA 70mo later (09/2016) T-score -.3.1 (different machine, though).  Marland Kitchen PAF (paroxysmal atrial fibrillation) (HCC)    Dr. Martinique: Flecainide, metopr, apxiaban.  08/2017-->persistent a-fib w/RVR, flecainide increased and she converted back to NSR and felt better.  100mg  bid flecainide continued.  . Peripheral neuropathy 2008   Idiopathic vs familial (motor>sensory) vs lumbar disc dz: gabapentin helpful  . Shoulder pain left   RC surg 09/2014    Past Surgical History:  Procedure Laterality Date  . BREAST BIOPSY  04/10/2017   Left breast core needle biopsy of calcifications.  COMPLEX SCLEROSING LESION WITH CALCIFICATIONS --no sign of malignancy.  Excision recommended--pt has been referred to Dr. Donne Hazel, who recommended rpt mammo and this was NORMAL 12/2017.  Marland Kitchen BREAST EXCISIONAL BIOPSY Right 2010   Benign  . BREAST SURGERY  Nov 2010   Benign biopsy, right  . CHOLECYSTECTOMY  03/2010   Dr.Gross  . COLONOSCOPY  10/03/2002   No polyps.  Repeat 10 yrs recommended but pt declines.  Pt declined cologuard 08/2016 but changed her mind and this test was  NEG on 03/23/17.  . COMBINED HYSTERECTOMY VAGINAL / OOPHORECTOMY / A&P REPAIR  0000000   uncertain if ovaries were removed or not  . CYSTOCELE REPAIR    . Lumpectomy  06/2009   "Fatty Necrosis"  . PFT's  2014   NORMAL  . REVERSE SHOULDER ARTHROPLASTY Right 03/11/2019   Procedure: REVERSE TOTAL SHOULDER ARTHROPLASTY;  Surgeon: Netta Cedars, MD;  Location: [facility];  Service: Orthopedics;  Laterality: Right;  Intersclene block  . ROTATOR CUFF REPAIR Left 10/02/14   Dr. Veverly Fells  . TONSILLECTOMY    . TOTAL KNEE ARTHROPLASTY  09/2009   Right ;Dr Alvan Dame  . TOTAL KNEE ARTHROPLASTY Left 03/10/2016   Procedure: TOTAL KNEE  ARTHROPLASTY;  Surgeon: Paralee Cancel, MD;  Location: [facility];  Service: Orthopedics;  Laterality: Left;  . TRANSTHORACIC ECHOCARDIOGRAM  09/2012; 09/27/15   2014: EF 60-65%, Gr 1 DD, mild AI, mild bileaflet MVP, mod post directed MR, mild LAE, PASP 35.  Repeat 2017: EF 55-60%, mild AR, mild MVP and mild MV regurg.  Marland Kitchen VAGINAL HYSTERECTOMY     For Uterine Deviation     There were no vitals filed for this visit.   Subjective Assessment - 05/06/19 1257    Subjective  COVID-19 screening performed upon arrival.Patient arrives to physical therapy with reports of weakness and loss of endurance that has progressively worsened over the past year. Patient was diagnosed with Parkinson's Disease and per daughter in law's report was reluctant to attend PT. Patient reports she tires out easily particularly with walking. She is independent with all ADLs but takes time to perform due to weakness. Patient reports a history of falls; she most recently fell in August after her right reverse total shoulder replacement. Patient's goals are to improve movement and improve strength.    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  10-15 mins    How long can you walk comfortably?  10-15 mins    Patient Stated Goals  get stronger    Currently in Pain?  Yes    Pain Location  Shoulder    Pain Orientation  Right    Pain Descriptors / Indicators  Discomfort    Pain Type  Surgical pain    Pain Onset  More than a month ago         [facility] PT Assessment - 05/06/19 0001      Assessment   Medical Diagnosis  Parkinson's Disease    Referring Provider (PT)  Alonza Bogus, DO    Onset Date/Surgical Date  --   September 2020   Next MD Visit  n/a    Prior Therapy  yes       Precautions   Precautions  Fall    Precaution Comments  03/11/2019 right Reverse TSA      Balance Screen   Has the patient fallen in the past 6 months  Yes    How many times?  3    Has the patient had a decrease in activity level because of a fear of falling?   No    Is the patient reluctant to leave their home because of a fear of falling?   No      Home Social worker  Private residence    Living  Arrangements  Alone      Prior Function   Level of Independence  Independent with household mobility with device;Independent with basic ADLs      Posture/Postural Control   Posture/Postural Control  Postural limitations    Postural Limitations  Rounded Shoulders;Forward head;Flexed trunk   Scoliosis     ROM / Strength   AROM / PROM / Strength  Strength      Strength   Strength Assessment Site  Hip;Knee;Ankle    Right/Left Hip  Right;Left    Right Hip Flexion  3+/5    Left Hip Flexion  3+/5    Right/Left Knee  Right;Left    Right Knee Flexion  4-/5    Right Knee Extension  4+/5    Left Knee Flexion  4/5    Left Knee Extension  4+/5    Right/Left Ankle  Right;Left    Right Ankle Dorsiflexion  4+/5    Right Ankle Inversion  4+/5    Right Ankle Eversion  4+/5    Left Ankle Dorsiflexion  4+/5    Left Ankle Inversion  4+/5    Left Ankle Eversion  4+/5      Transfers   Transfers  Independent with all Transfers    Comments  increased time to perform activity      Ambulation/Gait   Gait Pattern  Step-through pattern;Decreased stride length;Decreased step length - left;Decreased step length - right;Decreased weight shift to right;Shuffle;Trunk flexed;Poor foot clearance - left;Poor foot clearance - right      Balance   Balance Assessed  Yes      Standardized Balance Assessment   Standardized Balance Assessment  Five Times Sit to Stand;Berg Balance Test    Five times sit to stand comments   modified 16 seconds      Berg Balance Test   Sit to Stand  Able to  stand  independently using hands    Standing Unsupported  Able to stand 2 minutes with supervision    Sitting with Back Unsupported but Feet Supported on Floor or Stool  Able to sit 2 minutes under supervision    Stand to Sit  Controls descent by using hands    Transfers  Able to transfer safely, definite need of hands    Standing Unsupported with Eyes Closed  Able to stand 10 seconds with supervision    From Standing, Reach Forward with Outstretched Arm  Can reach forward >12 cm safely (5")    Turn 360 Degrees  Able to turn 360 degrees safely but slowly                Objective measurements completed on examination: See above findings.              PT Education - 05/06/19 1259    Education Details  Heel raises, toe raises, hip abduction, hip adduction, marching all seated    Person(s) Educated  Patient;Child(ren)    Methods  Explanation;Demonstration;Handout    Comprehension  Verbalized understanding;Returned demonstration          PT Long Term Goals - 05/06/19 1304      PT LONG TERM GOAL #1   Title  Patient will be independent with a HEP.    Time  6    Period  Weeks    Status  New      PT LONG TERM GOAL #2   Title  Patient will report ability to stand for 20 minutes or greater with no loss of balance  to safely perform home activities.    Time  6    Period  Weeks    Status  New      PT LONG TERM GOAL #3   Title  Patient will report ability to walk with an AD for 20 minutes or greater to improve cardiovascular health and muscular endurance.    Time  6    Period  Weeks    Status  New      PT LONG TERM GOAL #4   Title  Patient will decrease risk of fall by performing modified 5x sit to stand in 11 seconds or less.    Time  6    Period  Weeks    Status  New             Plan - 05/06/19 1300    Clinical Impression Statement  Patient is a 78 year old female who presents to physical therapy with her daughter in law with decreased bilateral MMT.  Patient's modified 5x sit to stand time of 16 seconds categorizes her as a fall risk. Patient ambulated without and AD but reports she utilizes a walker at home for safety. Patient demonstrates a step through gait pattern, bilateral decreased foot clearance, increased trunk flexion, and decreased stride lengths. Patient and PT discussed exercise with Parkinson's Disease, discussed importance of balance activities as well as reviewed HEP. Patient and patient's daughter in law reported understanding. Patient would benefit from skilled physical therapy to address deficits and address patient's goals.    Personal Factors and Comorbidities  Age;Comorbidity 3+    Comorbidities  Parkinson's Disease (Dx September 2020) A Fib, neuropathy, osteoporosis.    Examination-Activity Limitations  Stand;Lift    Examination-Participation Restrictions  Cleaning;Meal Prep    Stability/Clinical Decision Making  Evolving/Moderate complexity    Clinical Decision Making  Moderate    Rehab Potential  Good    PT Frequency  2x / week    PT Duration  6 weeks    PT Treatment/Interventions  ADLs/Self Care Home Management;Cryotherapy;Electrical Stimulation;Moist Heat;Balance training;Therapeutic exercise;Therapeutic activities;Functional mobility training;Stair training;Gait training;Neuromuscular re-education;Patient/family education;Passive range of motion;Manual techniques    PT Next Visit Plan  Complete Berg and send to [place] to add goal; Nustep, LE strengthening, balance in sitting and standing, be careful exercising with right UE (reverse TSA)    PT Home Exercise Plan  see patient education section    Consulted and Agree with Plan of Care  Patient       Patient will benefit from skilled therapeutic intervention in order to improve the following deficits and impairments:  Abnormal gait, Difficulty walking, Decreased balance, Decreased activity tolerance, Decreased strength, Postural dysfunction, Pain, Decreased endurance,  Decreased safety awareness  Visit Diagnosis: Unsteadiness on feet  Muscle weakness (generalized)  Repeated falls     Problem List Patient Active Problem List   Diagnosis Date Noted  . 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 Pain in both feet 12/14/2017  . Neuropathy 12/14/2017  . Benign essential tremor 12/14/2017  . Paroxysmal atrial fibrillation ([place]) 12/14/2017  . Chronic low back pain without sciatica 12/14/2017  . 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  . Snoring 05/19/2017  . 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 Female bladder prolapse 12/06/2015  . Paroxysmal atrial fibrillation with rapid ventricular response ([place]) 09/28/2015  . Maxillary sinusitis, acute 08/27/2015  . Mitral insufficiency 05/12/2014  . HTN (hypertension) 04/28/2014  . Lactic acidosis 09/07/2013  . Mitral valve prolapse 06/14/2013  . Chest pain, exertional  09/13/2012  . Pernicious anemia 09/07/2012  . DIVERTICULOSIS, COLON 09/06/2009  . Osteoporosis 09/06/2009  . Hyperlipidemia 09/04/2009  . Peripheral sensory neuropathy 09/04/2009  . LOW BACK PAIN, CHRONIC 04/08/2007    Gabriela Eves, PT, DPT 05/06/2019, 1:24 PM  [facility] [place], [place], [address] Phone: [phone]   Fax:  [phone]  Name: Cassie White MRN: [id number] Date of Birth: 09/24/1940

## 2019-05-09 ENCOUNTER — Ambulatory Visit: Payer: Medicare Other | Admitting: Physical Therapy

## 2019-05-11 ENCOUNTER — Ambulatory Visit: Payer: Medicare Other | Admitting: Physical Therapy

## 2019-05-11 ENCOUNTER — Encounter: Payer: Self-pay | Admitting: Physical Therapy

## 2019-05-11 ENCOUNTER — Other Ambulatory Visit: Payer: Self-pay

## 2019-05-11 DIAGNOSIS — R2681 Unsteadiness on feet: Secondary | ICD-10-CM | POA: Diagnosis not present

## 2019-05-11 DIAGNOSIS — R296 Repeated falls: Secondary | ICD-10-CM

## 2019-05-11 DIAGNOSIS — M6281 Muscle weakness (generalized): Secondary | ICD-10-CM

## 2019-05-11 NOTE — Therapy (Signed)
[facility] [place], [place], [address] Phone: [phone]   Fax:  [phone]  Physical Therapy Treatment  Patient Details  Name: Cassie SCHWANKE MRN: [id number] Date of Birth: [birth year] Referring Provider (PT): Alonza Bogus, DO   Encounter Date: 05/11/2019  PT End of Session - 05/11/19 0939    Visit Number  2    Number of Visits  12    Date for PT Re-Evaluation  06/24/19    Authorization Type  Progress note at 10th visit    PT Start Time  0900    PT Stop Time  0940    PT Time Calculation (min)  40 min    Activity Tolerance  Patient tolerated treatment well    Behavior During Therapy  [facility] for tasks assessed/performed       Past Medical History:  Diagnosis Date  . Abnormal mammogram of left breast    Likely benign microcalcifications--repeat L diag mammo 03/24/2017.  COMPLEX SCLEROSING LESION WITH CALCIFICATIONS --no sign of malignancy.  Excision recommended--pt has been referred to Dr. Donne Hazel, who recommended f/u mammo and this was normal 12/2017--consider rpt 1 yr per rad.  . Arthritis    DJD, back and both hips  . Chronic low back pain without sciatica    Lumbar spondylosis + scoliosis.  Summer 2017 Dr. Nelva Bush did ESI and pt got no relief.  Pt then saw Dr. Maia Petties with [facility]: felt she had facet mediated pain; plan for B L345 MBB (??).  Marland Kitchen Diverticulosis    on colonoscopy 2004  . GERD (gastroesophageal reflux disease)   . Hx of cardiovascular stress test    Lexiscan Myoview (2/14):  Low risk, small ant defect likely breast attenuation, no ischemia, EF 69% (no change from 2010).    . Hyperlipidemia   . Hypertension    per pt  . Idiopathic scoliosis of thoracolumbar region   . Lumbar back pain   . Mitral regurgitation    Echo (2/14):  EF 60-65%, Gr 1 DD, mild AI, mild bileaflet MVP, mod post directed MR, mild LAE, PASP 35.  Marland Kitchen MVP (mitral valve prolapse)   . 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  Responding to gabapentin (Dr. Brett Fairy).  . Osteoporosis, senile    stable at the left hip 2014-2015 (T-score -2.7 when she stopped bisphosphonate).  Took fosamax for 10 yrs.  Repeat DEXA 19mo later (09/2016) T-score -.3.1 (different machine, though).  Marland Kitchen PAF (paroxysmal atrial fibrillation) (HCC)    Dr. Martinique: Flecainide, metopr, apxiaban.  08/2017-->persistent a-fib w/RVR, flecainide increased and she converted back to NSR and felt better.  100mg  bid flecainide continued.  . Peripheral neuropathy 2008   Idiopathic vs familial (motor>sensory) vs lumbar disc dz: gabapentin helpful  . Shoulder pain left   RC surg 09/2014    Past Surgical History:  Procedure Laterality Date  . BREAST BIOPSY  04/10/2017   Left breast core needle biopsy of calcifications.  COMPLEX SCLEROSING LESION WITH CALCIFICATIONS --no sign of malignancy.  Excision recommended--pt has been referred to Dr. Donne Hazel, who recommended rpt mammo and this was NORMAL 12/2017.  Marland Kitchen BREAST EXCISIONAL BIOPSY Right 2010   Benign  . BREAST SURGERY  Nov 2010   Benign biopsy, right  . CHOLECYSTECTOMY  03/2010   Dr.Gross  . COLONOSCOPY  10/03/2002   No polyps.  Repeat 10 yrs recommended but pt declines.  Pt declined cologuard 08/2016 but changed her mind and this test was  NEG on 03/23/17.  . COMBINED HYSTERECTOMY VAGINAL / OOPHORECTOMY / A&P REPAIR  0000000   uncertain if ovaries were removed or not  . CYSTOCELE REPAIR    . Lumpectomy  06/2009   "Fatty Necrosis"  . PFT's  2014   NORMAL  . REVERSE SHOULDER ARTHROPLASTY Right 03/11/2019   Procedure: REVERSE TOTAL SHOULDER ARTHROPLASTY;  Surgeon: Netta Cedars, MD;  Location: [facility];  Service: Orthopedics;  Laterality: Right;  Intersclene block  . ROTATOR CUFF REPAIR Left 10/02/14   Dr. Veverly Fells  . TONSILLECTOMY    . TOTAL KNEE ARTHROPLASTY  09/2009   Right ;Dr Alvan Dame  . TOTAL KNEE ARTHROPLASTY Left 03/10/2016   Procedure: TOTAL KNEE  ARTHROPLASTY;  Surgeon: Paralee Cancel, MD;  Location: [facility];  Service: Orthopedics;  Laterality: Left;  . TRANSTHORACIC ECHOCARDIOGRAM  09/2012; 09/27/15   2014: EF 60-65%, Gr 1 DD, mild AI, mild bileaflet MVP, mod post directed MR, mild LAE, PASP 35.  Repeat 2017: EF 55-60%, mild AR, mild MVP and mild MV regurg.  Marland Kitchen VAGINAL HYSTERECTOMY     For Uterine Deviation     There were no vitals filed for this visit.  Subjective Assessment - 05/11/19 0903    Subjective  COVID-19 screening performed upon arrival. No new complaints today.    Pertinent History  Parkinson's Disease, R reverse total shoulder replacement 03/11/2019, left TKA, A-Fib, osteoporosis, neuropathy    Limitations  Lifting;Standing;House hold activities;Walking    How long can you stand comfortably?  10-15 mins    How long can you walk comfortably?  10-15 mins    Patient Stated Goals  get stronger    Currently in Pain?  Yes    Pain Score  --   no number given   Pain Location  Shoulder    Pain Orientation  Right    Pain Descriptors / Indicators  Sore    Pain Type  Surgical pain    Pain Onset  More than a month ago    Pain Frequency  Constant    Aggravating Factors   use of shoulder    Pain Relieving Factors  at rest         [facility] PT Assessment - 05/11/19 0001      Berg Balance Test   Sit to Stand  Able to stand  independently using hands    Standing Unsupported  Able to stand 2 minutes with supervision    Sitting with Back Unsupported but Feet Supported on Floor or Stool  Able to sit 2 minutes under supervision    Stand to Sit  Controls descent by using hands    Transfers  Able to transfer safely, definite need of hands    Standing Unsupported with Eyes Closed  Able to stand 10 seconds with supervision    Standing Unsupported with Feet Together  Able to place feet together independently but unable to hold for 30 seconds    From Standing, Reach Forward with Outstretched Arm  Can reach forward >12 cm safely (5")    From  Standing Position, Pick up Object from Floor  Unable to pick up shoe, but reaches 2-5 cm (1-2") from shoe and balances independently    From Standing Position, Turn to Look Behind Over each Shoulder  Turn sideways only but maintains balance    Turn 360 Degrees  Able to turn 360 degrees safely but slowly    Standing Unsupported, Alternately Place Feet on Step/Stool  Able to complete >2 steps/needs minimal assist  Standing Unsupported, One Foot in Front  Needs help to step but can hold 15 seconds    Standing on One Leg  Unable to try or needs assist to prevent fall    Total Score  31                   [facility] Adult PT Treatment/Exercise - 05/11/19 0001      Exercises   Exercises  Knee/Hip;Lumbar      Lumbar Exercises: Aerobic   Nustep  L3 x 20min LE only      Knee/Hip Exercises: Seated   Long Arc Quad  Strengthening;Both;2 sets;10 reps    Long Arc Quad Weight  2 lbs.    Ball Squeeze  3sec x30    Clamshell with TheraBand  Red   x30   Marching  Strengthening;Both;2 sets;10 reps;Weights    Marching Weights  2 lbs.    Hamstring Curl  Strengthening;Both;20 reps    Hamstring Limitations  red t-band          Balance Exercises - 05/11/19 0923      Balance Exercises: Standing   Standing Eyes Opened  Narrow base of support (BOS);Wide (BOA);Foam/compliant surface;Solid surface;Time   several reps each one   Standing, One Foot on a Step  6 inch;Eyes open;4 reps;10 secs    Heel Raises Limitations  x10    Toe Raise Limitations  x10    Sit to Stand Time  x10             PT Long Term Goals - 05/11/19 0910      PT LONG TERM GOAL #1   Title  Patient will be independent with a HEP.    Time  6    Period  Weeks    Status  On-going      PT LONG TERM GOAL #2   Title  Patient will report ability to stand for 20 minutes or greater with no loss of balance to safely perform home activities.    Time  6    Period  Weeks    Status  On-going      PT LONG TERM GOAL #3    Title  Patient will report ability to walk with an AD for 20 minutes or greater to improve cardiovascular health and muscular endurance.    Time  6    Period  Weeks    Status  On-going      PT LONG TERM GOAL #4   Title  Patient will decrease risk of fall by performing modified 5x sit to stand in 11 seconds or less.    Time  6    Period  Weeks    Status  On-going            Plan - 05/11/19 0939    Clinical Impression Statement  Patient tolerated treatment well today. Patient has reported ongoing right shoulder pain due to a previous surgery. Patient able to progress with LE strengthening exercises as well as balance activities today. Patient has reported no falls and she does use a walker at times. Patient did not use a assistive device today. Goals ongoing at this time due to limitations.    Personal Factors and Comorbidities  Age;Comorbidity 3+    Comorbidities  Parkinson's Disease (Dx September 2020) A Fib, neuropathy, osteoporosis.    Examination-Activity Limitations  Stand;Lift    Examination-Participation Restrictions  Cleaning;Meal Prep    Stability/Clinical Decision Making  Evolving/Moderate complexity    Rehab  Potential  Good    PT Frequency  2x / week    PT Duration  6 weeks    PT Treatment/Interventions  ADLs/Self Care Home Management;Cryotherapy;Electrical Stimulation;Moist Heat;Balance training;Therapeutic exercise;Therapeutic activities;Functional mobility training;Stair training;Gait training;Neuromuscular re-education;Patient/family education;Passive range of motion;Manual techniques    PT Next Visit Plan  cont with POC for Nustep LE only, LE strengthening, balance in sitting and standing, be careful exercising with right UE (reverse TSA)    Consulted and Agree with Plan of Care  Patient       Patient will benefit from skilled therapeutic intervention in order to improve the following deficits and impairments:  Abnormal gait, Difficulty walking, Decreased balance,  Decreased activity tolerance, Decreased strength, Postural dysfunction, Pain, Decreased endurance, Decreased safety awareness  Visit Diagnosis: Unsteadiness on feet  Muscle weakness (generalized)  Repeated falls     Problem List Patient Active Problem List   Diagnosis Date Noted  . 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 Pain in both feet 12/14/2017  . Neuropathy 12/14/2017  . Benign essential tremor 12/14/2017  . Paroxysmal atrial fibrillation ([place]) 12/14/2017  . Chronic low back pain without sciatica 12/14/2017  . 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  . Snoring 05/19/2017  . 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 Female bladder prolapse 12/06/2015  . Paroxysmal atrial fibrillation with rapid ventricular response ([place]) 09/28/2015  . Maxillary sinusitis, acute 08/27/2015  . Mitral insufficiency 05/12/2014  . HTN (hypertension) 04/28/2014  . Lactic acidosis 09/07/2013  . Mitral valve prolapse 06/14/2013  . Chest pain, exertional 09/13/2012  . Pernicious anemia 09/07/2012  . DIVERTICULOSIS, COLON 09/06/2009  . Osteoporosis 09/06/2009  . Hyperlipidemia 09/04/2009  . Peripheral sensory neuropathy 09/04/2009  . LOW BACK PAIN, CHRONIC 04/08/2007    Ladean Raya, PTA 05/11/19 9:46 AM  [facility] [place], [place], [address] Phone: [phone]   Fax:  [phone]  Name: PAISLIE PEROTTI MRN: [id number] Date of Birth: Aug 13, 1940

## 2019-05-16 ENCOUNTER — Other Ambulatory Visit: Payer: Self-pay

## 2019-05-16 ENCOUNTER — Ambulatory Visit: Payer: Medicare Other | Admitting: Physical Therapy

## 2019-05-16 ENCOUNTER — Encounter: Payer: Self-pay | Admitting: Physical Therapy

## 2019-05-16 DIAGNOSIS — R296 Repeated falls: Secondary | ICD-10-CM

## 2019-05-16 DIAGNOSIS — R2681 Unsteadiness on feet: Secondary | ICD-10-CM

## 2019-05-16 DIAGNOSIS — M6281 Muscle weakness (generalized): Secondary | ICD-10-CM | POA: Diagnosis not present

## 2019-05-16 NOTE — Therapy (Signed)
[facility] [place], [place], [address] Phone: [phone]   Fax:  [phone]  Physical Therapy Treatment  Patient Details  Name: Cassie White MRN: [id number] Date of Birth: Sep 10, 1940 Referring Provider (PT): Alonza Bogus, DO   Encounter Date: 05/16/2019  PT End of Session - 05/16/19 1101    Visit Number  3    Number of Visits  12    Date for PT Re-Evaluation  06/24/19    Authorization Type  Progress note at 10th visit    PT Start Time  1031    PT Stop Time  1101    PT Time Calculation (min)  30 min    Activity Tolerance  Patient tolerated treatment well    Behavior During Therapy  [facility] for tasks assessed/performed       Past Medical History:  Diagnosis Date  . Abnormal mammogram of left breast    Likely benign microcalcifications--repeat L diag mammo 03/24/2017.  COMPLEX SCLEROSING LESION WITH CALCIFICATIONS --no sign of malignancy.  Excision recommended--pt has been referred to Dr. Donne Hazel, who recommended f/u mammo and this was normal 12/2017--consider rpt 1 yr per rad.  . Arthritis    DJD, back and both hips  . Chronic low back pain without sciatica    Lumbar spondylosis + scoliosis.  Summer 2017 Dr. Nelva Bush did ESI and pt got no relief.  Pt then saw Dr. Maia Petties [facility]: felt she had facet mediated pain; plan for B L345 MBB (??).  Marland Kitchen Diverticulosis    on colonoscopy 2004  . GERD (gastroesophageal reflux disease)   . Hx of cardiovascular stress test    Lexiscan Myoview (2/14):  Low risk, small ant defect likely breast attenuation, no ischemia, EF 69% (no change from 2010).    . Hyperlipidemia   . Hypertension    per pt  . Idiopathic scoliosis of thoracolumbar region   . Lumbar back pain   . Mitral regurgitation    Echo (2/14):  EF 60-65%, Gr 1 DD, mild AI, mild bileaflet MVP, mod post directed MR, mild LAE, PASP 35.  Marland Kitchen MVP (mitral valve prolapse)   . 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  Responding to gabapentin (Dr. Brett Fairy).  . Osteoporosis, senile    stable at the left hip 2014-2015 (T-score -2.7 when she stopped bisphosphonate).  Took fosamax for 10 yrs.  Repeat DEXA 58mo later (09/2016) T-score -.3.1 (different machine, though).  Marland Kitchen PAF (paroxysmal atrial fibrillation) (HCC)    Dr. [place]: Flecainide, metopr, apxiaban.  08/2017-->persistent a-fib w/RVR, flecainide increased and she converted back to NSR and felt better.  100mg  bid flecainide continued.  . Peripheral neuropathy 2008   Idiopathic vs familial (motor>sensory) vs lumbar disc dz: gabapentin helpful  . Shoulder pain left   RC surg 09/2014    Past Surgical History:  Procedure Laterality Date  . BREAST BIOPSY  04/10/2017   Left breast core needle biopsy of calcifications.  COMPLEX SCLEROSING LESION WITH CALCIFICATIONS --no sign of malignancy.  Excision recommended--pt has been referred to Dr. Donne Hazel, who recommended rpt mammo and this was NORMAL 12/2017.  Marland Kitchen BREAST EXCISIONAL BIOPSY Right 2010   Benign  . BREAST SURGERY  Nov 2010   Benign biopsy, right  . CHOLECYSTECTOMY  03/2010   Dr.Gross  . COLONOSCOPY  10/03/2002   No polyps.  Repeat 10 yrs recommended but pt declines.  Pt declined cologuard 08/2016 but changed her mind and this test was  NEG on 03/23/17.  . COMBINED HYSTERECTOMY VAGINAL / OOPHORECTOMY / A&P REPAIR  0000000   uncertain if ovaries were removed or not  . CYSTOCELE REPAIR    . Lumpectomy  06/2009   "Fatty Necrosis"  . PFT's  2014   NORMAL  . REVERSE SHOULDER ARTHROPLASTY Right 03/11/2019   Procedure: REVERSE TOTAL SHOULDER ARTHROPLASTY;  Surgeon: Netta Cedars, MD;  Location: [facility];  Service: Orthopedics;  Laterality: Right;  Intersclene block  . ROTATOR CUFF REPAIR Left 10/02/14   Dr. Veverly Fells  . TONSILLECTOMY    . TOTAL KNEE ARTHROPLASTY  09/2009   Right ;Dr Alvan Dame  . TOTAL KNEE ARTHROPLASTY Left 03/10/2016   Procedure: TOTAL KNEE  ARTHROPLASTY;  Surgeon: Paralee Cancel, MD;  Location: [facility];  Service: Orthopedics;  Laterality: Left;  . TRANSTHORACIC ECHOCARDIOGRAM  09/2012; 09/27/15   2014: EF 60-65%, Gr 1 DD, mild AI, mild bileaflet MVP, mod post directed MR, mild LAE, PASP 35.  Repeat 2017: EF 55-60%, mild AR, mild MVP and mild MV regurg.  Marland Kitchen VAGINAL HYSTERECTOMY     For Uterine Deviation     There were no vitals filed for this visit.  Subjective Assessment - 05/16/19 1034    Subjective  COVID-19 screening performed upon arrival. 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  10-15 mins    How long can you walk comfortably?  10-15 mins    Patient Stated Goals  get stronger    Currently in Pain?  Yes    Pain Score  --   no number given   Pain Location  Shoulder    Pain Orientation  Right    Pain Descriptors / Indicators  Sore    Pain Type  Surgical pain    Pain Onset  More than a month ago    Pain Frequency  Constant    Aggravating Factors   use of shoulder    Pain Relieving Factors  at rest                       [facility] Adult PT Treatment/Exercise - 05/16/19 0001      Lumbar Exercises: Aerobic   Nustep  L2 x13min LO and left UE only      Knee/Hip Exercises: Seated   Long Arc Quad  Strengthening;Both;2 sets;10 reps    Long Arc Quad Weight  2 lbs.    Ball Squeeze  3sec x30    Clamshell with TheraBand  Red   x20   Marching  Strengthening;Both;2 sets;10 reps;Weights    Marching Weights  2 lbs.    Hamstring Curl  Strengthening;Both;20 reps    Hamstring Limitations  red t-band          Balance Exercises - 05/16/19 1044      Balance Exercises: Standing   Sit to Stand Time  x10      Balance Exercises: Seated   Heel Raises  10 reps   while  sitting on bosu   Other Seated Exercises  sitting on dyna disc for LAQ 2x10, then grey ball reach outs and elevation with left UE 2x10             PT Long Term Goals - 05/11/19 0910      PT LONG TERM  GOAL #1   Title  Patient will be independent with a HEP.    Time  6    Period  Weeks    Status  On-going      PT LONG TERM GOAL #2   Title  Patient will report ability to stand for 20 minutes or greater with no loss of balance to safely perform home activities.    Time  6    Period  Weeks    Status  On-going      PT LONG TERM GOAL #3   Title  Patient will report ability to walk with an AD for 20 minutes or greater to improve cardiovascular health and muscular endurance.    Time  6    Period  Weeks    Status  On-going      PT LONG TERM GOAL #4   Title  Patient will decrease risk of fall by performing modified 5x sit to stand in 11 seconds or less.    Time  6    Period  Weeks    Status  On-going      PT LONG TERM GOAL #5   Title  Patient will decrease risk of falls by scoring 40/56 on Berg Balance Scale    Time  6    Period  Weeks    Status  New            Plan - 05/16/19 1103    Clinical Impression Statement  Patient tolerated treatment fair today. Patient has limitations due to pain in right shoulder and unable to use it at this time. Patient also has difficulty with standing activities due to neuropathy. Today patient requested short treatment due to limitations. Today focused on seated balance and strengthening. Patient current goals ongoing.    Personal Factors and Comorbidities  Age;Comorbidity 3+    Comorbidities  Parkinson's Disease (Dx September 2020) A Fib, neuropathy, osteoporosis.    Examination-Activity Limitations  Stand;Lift    Examination-Participation Restrictions  Cleaning;Meal Prep    Stability/Clinical Decision Making  Evolving/Moderate complexity    Rehab Potential  Good    PT Frequency  2x / week    PT Duration  6 weeks    PT  Treatment/Interventions  ADLs/Self Care Home Management;Cryotherapy;Electrical Stimulation;Moist Heat;Balance training;Therapeutic exercise;Therapeutic activities;Functional mobility training;Stair training;Gait training;Neuromuscular re-education;Patient/family education;Passive range of motion;Manual techniques    PT Next Visit Plan  cont with POC for Nustep LE only, LE strengthening, balance in sitting and standing, be careful exercising with right UE (reverse TSA)    Consulted and Agree with Plan of Care  Patient       Patient will benefit from skilled therapeutic intervention in order to improve the following deficits and impairments:  Abnormal gait, Difficulty walking, Decreased balance, Decreased activity tolerance, Decreased strength, Postural dysfunction, Pain, Decreased endurance, Decreased safety awareness  Visit Diagnosis: Unsteadiness on feet  Muscle weakness (generalized)  Repeated falls     Problem List Patient Active Problem List   Diagnosis Date Noted  . 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 Pain in both feet 12/14/2017  . Neuropathy 12/14/2017  . Benign essential tremor 12/14/2017  . Paroxysmal atrial fibrillation ([place]) 12/14/2017  . Chronic low back pain without sciatica 12/14/2017  . 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  . Snoring 05/19/2017  . 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 Female bladder prolapse 12/06/2015  . Paroxysmal atrial fibrillation with rapid ventricular response ([place]) 09/28/2015  . Maxillary sinusitis, acute 08/27/2015  . Mitral insufficiency  05/12/2014  . HTN (hypertension) 04/28/2014  . Lactic acidosis 09/07/2013  . Mitral valve prolapse 06/14/2013  . Chest pain, exertional 09/13/2012  . Pernicious anemia 09/07/2012  . DIVERTICULOSIS, COLON 09/06/2009  . Osteoporosis 09/06/2009  . Hyperlipidemia 09/04/2009  . Peripheral sensory neuropathy 09/04/2009  . LOW BACK PAIN, CHRONIC 04/08/2007    ,  P,  PTA 05/16/2019, 11:09 AM  [facility] [place], [place], [address] Phone: [phone]   Fax:  [phone]  Name: SHABREE CRIBB MRN: [id number] Date of Birth: [birth year]

## 2019-05-17 ENCOUNTER — Encounter: Payer: Self-pay | Admitting: Family Medicine

## 2019-05-20 ENCOUNTER — Ambulatory Visit: Payer: Medicare Other | Admitting: Family Medicine

## 2019-06-01 ENCOUNTER — Encounter: Payer: Self-pay | Admitting: Physical Therapy

## 2019-06-01 ENCOUNTER — Ambulatory Visit: Payer: Medicare Other | Admitting: Physical Therapy

## 2019-06-01 ENCOUNTER — Other Ambulatory Visit: Payer: Self-pay

## 2019-06-01 DIAGNOSIS — R296 Repeated falls: Secondary | ICD-10-CM | POA: Diagnosis not present

## 2019-06-01 DIAGNOSIS — R2681 Unsteadiness on feet: Secondary | ICD-10-CM | POA: Diagnosis not present

## 2019-06-01 DIAGNOSIS — M6281 Muscle weakness (generalized): Secondary | ICD-10-CM

## 2019-06-01 NOTE — Therapy (Signed)
[facility] [place], [place], [address] Phone: [phone]   Fax:  [phone]  Physical Therapy Treatment  Patient Details  Name: Cassie White MRN: [id number] Date of Birth: 1941-05-24 Referring Provider (PT): Alonza Bogus, DO   Encounter Date: 06/01/2019  PT End of Session - 06/01/19 1133    Visit Number  4    Number of Visits  12    Date for PT Re-Evaluation  06/24/19    Authorization Type  Progress note at 10th visit    PT Start Time  1123    PT Stop Time  1203    PT Time Calculation (min)  40 min    Activity Tolerance  Patient tolerated treatment well;Patient limited by pain;Other (comment)   shoulder pain   Behavior During Therapy  [facility] for tasks assessed/performed       Past Medical History:  Diagnosis Date  . Abnormal mammogram of left breast    Likely benign microcalcifications--repeat L diag mammo 03/24/2017.  COMPLEX SCLEROSING LESION WITH CALCIFICATIONS --no sign of malignancy.  Excision recommended--pt has been referred to Dr. Donne Hazel, who recommended f/u mammo and this was normal 12/2017--consider rpt 1 yr per rad.  . Arthritis    DJD, back and both hips  . Chronic low back pain without sciatica    Lumbar spondylosis + scoliosis.  Summer 2017 Dr. Nelva Bush did ESI and pt got no relief.  Pt then saw Dr. Maia Petties with [facility]: felt she had facet mediated pain; plan for B L345 MBB (??).  Marland Kitchen Diverticulosis    on colonoscopy 2004  . GERD (gastroesophageal reflux disease)   . Hx of cardiovascular stress test    Lexiscan Myoview (2/14):  Low risk, small ant defect likely breast attenuation, no ischemia, EF 69% (no change from 2010).    . Hyperlipidemia   . Hypertension    per pt  . Idiopathic scoliosis of thoracolumbar region   . Lumbar back pain   . Mitral regurgitation    Echo (2/14):  EF 60-65%, Gr 1 DD, mild AI, mild bileaflet MVP, mod post directed MR, mild LAE, PASP 35.  Marland Kitchen MVP (mitral valve  prolapse)   . 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  Responding to gabapentin (Dr. Brett Fairy).  . Osteoporosis, senile    stable at the left hip 2014-2015 (T-score -2.7 when she stopped bisphosphonate).  Took fosamax for 10 yrs.  Repeat DEXA 64molater (09/2016) T-score -.3.1 (different machine, though).  .Marland KitchenPAF (paroxysmal atrial fibrillation) (HCC)    Dr. JMartinique Flecainide, metopr, apxiaban.  08/2017-->persistent a-fib w/RVR, flecainide increased and she converted back to NSR and felt better.  1027mbid flecainide continued.  . [place]   started sinemet 05/2019 (Dr. TaCarles Collet . Peripheral neuropathy 2008   Idiopathic vs familial (motor>sensory) vs lumbar disc dz: gabapentin helpful  . Shoulder pain left   RC surg 09/2014    Past Surgical History:  Procedure Laterality Date  . BREAST BIOPSY  04/10/2017   Left breast core needle biopsy of calcifications.  COMPLEX SCLEROSING LESION WITH CALCIFICATIONS --no sign of malignancy.  Excision recommended--pt has been referred to Dr. WaDonne Hazelwho recommended rpt mammo and this was NORMAL 12/2017.  . Marland KitchenREAST EXCISIONAL BIOPSY Right 2010   Benign  . BREAST SURGERY  Nov 2010   Benign biopsy, right  . CHOLECYSTECTOMY  03/2010   Dr.Gross  . COLONOSCOPY  10/03/2002   No polyps.  Repeat 10 yrs recommended but pt declines.  Pt declined cologuard 08/2016 but changed her mind and this test was NEG on 03/23/17.  . COMBINED HYSTERECTOMY VAGINAL / OOPHORECTOMY / A&P REPAIR  8315   uncertain if ovaries were removed or not  . CYSTOCELE REPAIR    . Lumpectomy  06/2009   "Fatty Necrosis"  . PFT's  2014   NORMAL  . REVERSE SHOULDER ARTHROPLASTY Right 03/11/2019   Procedure: REVERSE TOTAL SHOULDER ARTHROPLASTY;  Surgeon: Netta Cedars, MD;  Location: [facility];  Service: Orthopedics;  Laterality: Right;  Intersclene block  . ROTATOR CUFF REPAIR Left 10/02/14   Dr. Veverly Fells  . TONSILLECTOMY    .  TOTAL KNEE ARTHROPLASTY  09/2009   Right ;Dr Alvan Dame  . TOTAL KNEE ARTHROPLASTY Left 03/10/2016   Procedure: TOTAL KNEE ARTHROPLASTY;  Surgeon: Paralee Cancel, MD;  Location: [facility];  Service: Orthopedics;  Laterality: Left;  . TRANSTHORACIC ECHOCARDIOGRAM  09/2012; 09/27/15   2014: EF 60-65%, Gr 1 DD, mild AI, mild bileaflet MVP, mod post directed MR, mild LAE, PASP 35.  Repeat 2017: EF 55-60%, mild AR, mild MVP and mild MV regurg.  Marland Kitchen VAGINAL HYSTERECTOMY     For Uterine Deviation     There were no vitals filed for this visit.  Subjective Assessment - 06/01/19 1126    Subjective  COVID-19 screening performed upon arrival. Paatient reported doing well today    Pertinent History  Parkinson's Disease, R reverse total shoulder replacement 03/11/2019, left TKA, A-Fib, osteoporosis, neuropathy    Limitations  Lifting;Standing;House hold activities;Walking    How long can you stand comfortably?  10-15 mins    How long can you walk comfortably?  10-15 mins    Patient Stated Goals  get stronger    Currently in Pain?  Yes    Pain Score  --   no number given   Pain Location  Shoulder    Pain Orientation  Right    Pain Descriptors / Indicators  Sore    Pain Type  Surgical pain    Pain Onset  More than a month ago    Pain Frequency  Constant    Aggravating Factors   use of shoulder    Pain Relieving Factors  at rest                       [facility] Adult PT Treatment/Exercise - 06/01/19 0001      Lumbar Exercises: Aerobic   Nustep  L3 x56mn LO and left UE only      Lumbar Exercises: Seated   Other Seated Lumbar Exercises  seated scap retraction x20      Knee/Hip Exercises: Seated   Long Arc Quad  Strengthening;Both;2 sets;10 reps    Long Arc Quad Weight  2 lbs.    Ball Squeeze  3sec x30    Clamshell with TheraBand  Red   x20   Marching  Strengthening;Both;2 sets;10 reps;Weights    Marching Weights  2 lbs.    Hamstring Curl  Strengthening;Both;20 reps    Hamstring Limitations   red t-band          Balance Exercises - 06/01/19 1153      Balance Exercises: Seated   Heel Raises  20 reps   sitting on dyna disc   Toe Raise  20 reps   sitting on dyna disc   Other Seated Exercises  sitting on dyna disc for LAQ 2x10, then grey ball reach outs and  elevation with left UE 2x10             PT Long Term Goals - 06/01/19 1136      PT LONG TERM GOAL #1   Title  Patient will be independent with a HEP.    Time  6    Period  Weeks    Status  On-going      PT LONG TERM GOAL #2   Title  Patient will report ability to stand for 20 minutes or greater with no loss of balance to safely perform home activities.    Time  6    Period  Weeks    Status  Achieved   able to perform activities for 50mn at a time with no LOB 06/01/19     PT LONG TERM GOAL #3   Title  Patient will report ability to walk with an AD for 20 minutes or greater to improve cardiovascular health and muscular endurance.    Time  6    Period  Weeks    Status  On-going      PT LONG TERM GOAL #4   Title  Patient will decrease risk of fall by performing modified 5x sit to stand in 11 seconds or less.    Time  6    Period  Weeks    Status  On-going   x3 in 11 seconds 06/01/19     PT LONG TERM GOAL #5   Title  Patient will decrease risk of falls by scoring 40/56 on Berg Balance Scale    Time  6    Period  Weeks    Status  On-going            Plan - 06/01/19 1155    Clinical Impression Statement  Patient tolerated treatment today. Patient able to progress with balance activities sitting and standing today with SBA and assist with uni UE support. Patient has reported being able to perform home activities for 317m with no LOB. No falls reported. Patient continues to use walker at home for safety. Patient met LTG #2 today with others progressing.    Personal Factors and Comorbidities  Age;Comorbidity 3+    Comorbidities  Parkinson's Disease (Dx September 2020) A Fib, neuropathy,  osteoporosis.    Examination-Activity Limitations  Stand;Lift    Examination-Participation Restrictions  Cleaning;Meal Prep    Stability/Clinical Decision Making  Evolving/Moderate complexity    Rehab Potential  Good    PT Frequency  2x / week    PT Duration  6 weeks    PT Treatment/Interventions  ADLs/Self Care Home Management;Cryotherapy;Electrical Stimulation;Moist Heat;Balance training;Therapeutic exercise;Therapeutic activities;Functional mobility training;Stair training;Gait training;Neuromuscular re-education;Patient/family education;Passive range of motion;Manual techniques    PT Next Visit Plan  cont with POC for Nustep LE only, LE strengthening, balance in sitting and standing, be careful exercising with right UE (reverse TSA)    Consulted and Agree with Plan of Care  Patient       Patient will benefit from skilled therapeutic intervention in order to improve the following deficits and impairments:  Abnormal gait, Difficulty walking, Decreased balance, Decreased activity tolerance, Decreased strength, Postural dysfunction, Pain, Decreased endurance, Decreased safety awareness  Visit Diagnosis: Muscle weakness (generalized)  Unsteadiness on feet  Repeated falls     Problem List Patient Active Problem List   Diagnosis Date Noted  . 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 Pain in both feet 12/14/2017  . Neuropathy 12/14/2017  . Benign essential tremor 12/14/2017  . Paroxysmal  atrial fibrillation ([place]) 12/14/2017  . Chronic low back pain without sciatica 12/14/2017  . 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  . Snoring 05/19/2017  . 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 Female bladder prolapse 12/06/2015  . Paroxysmal atrial fibrillation with rapid ventricular response ([place]) 09/28/2015  . Maxillary sinusitis, acute 08/27/2015  . Mitral insufficiency 05/12/2014  . HTN (hypertension) 04/28/2014  . Lactic acidosis 09/07/2013  . Mitral valve prolapse 06/14/2013  .  Chest pain, exertional 09/13/2012  . Pernicious anemia 09/07/2012  . DIVERTICULOSIS, COLON 09/06/2009  . Osteoporosis 09/06/2009  . Hyperlipidemia 09/04/2009  . Peripheral sensory neuropathy 09/04/2009  . LOW BACK PAIN, CHRONIC 04/08/2007    Mylia Pondexter P, PTA 06/01/2019, 12:04 PM  [facility] [place], [place], [address] Phone: [phone]   Fax:  [phone]  Name: Cassie White MRN: [id number] Date of Birth: [birth year]

## 2019-06-02 DIAGNOSIS — Z96611 Presence of right artificial shoulder joint: Secondary | ICD-10-CM | POA: Diagnosis not present

## 2019-06-02 DIAGNOSIS — Z471 Aftercare following joint replacement surgery: Secondary | ICD-10-CM | POA: Diagnosis not present

## 2019-06-03 ENCOUNTER — Ambulatory Visit: Payer: Medicare Other | Admitting: *Deleted

## 2019-06-06 ENCOUNTER — Telehealth: Payer: Self-pay | Admitting: Neurology

## 2019-06-06 NOTE — Telephone Encounter (Signed)
It doesn't cause hair loss.  She should continue her medication until she sees me next

## 2019-06-06 NOTE — Telephone Encounter (Signed)
Left a message for patient to return call.

## 2019-06-06 NOTE — Telephone Encounter (Signed)
Regarding the medication carbidopa-levodopa (SINEMET IR) 25-100 MG tablet patient is losing her hair and states that it is not controlling her PD. Unsure of how to proceed and would like to discuss with provider.

## 2019-06-06 NOTE — Telephone Encounter (Signed)
Cassie White is aware no further questions at this time

## 2019-06-06 NOTE — Telephone Encounter (Signed)
Tried to call but no answer line went silent and unable to leave voicemail

## 2019-06-06 NOTE — Telephone Encounter (Signed)
Patient daughter left a message on the VM stating she needs to speak to someone about possible side effect of medication. She did not know if she needs to bring the patient into the office to be seen or if she could just talk to someone about it. Please call

## 2019-06-08 ENCOUNTER — Encounter: Payer: Self-pay | Admitting: Physical Therapy

## 2019-06-08 ENCOUNTER — Other Ambulatory Visit: Payer: Self-pay

## 2019-06-08 ENCOUNTER — Ambulatory Visit: Payer: Medicare Other | Attending: Neurology | Admitting: Physical Therapy

## 2019-06-08 DIAGNOSIS — M6281 Muscle weakness (generalized): Secondary | ICD-10-CM | POA: Diagnosis not present

## 2019-06-08 DIAGNOSIS — R2681 Unsteadiness on feet: Secondary | ICD-10-CM

## 2019-06-08 DIAGNOSIS — R296 Repeated falls: Secondary | ICD-10-CM | POA: Diagnosis not present

## 2019-06-08 NOTE — Therapy (Signed)
[facility] [place], [place], [address] Phone: [phone]   Fax:  [phone]  Physical Therapy Treatment  Patient Details  Name: Cassie White MRN: [id number] Date of Birth: 1941-06-20 Referring Provider (PT): Alonza Bogus, DO   Encounter Date: 06/08/2019  PT End of Session - 06/08/19 1110    Visit Number  5    Number of Visits  12    Date for PT Re-Evaluation  06/24/19    Authorization Type  Progress note at 10th visit    PT Start Time  1030    PT Stop Time  1103    PT Time Calculation (min)  33 min    Activity Tolerance  Patient tolerated treatment well    Behavior During Therapy  [facility] for tasks assessed/performed       Past Medical History:  Diagnosis Date  . Abnormal mammogram of left breast    Likely benign microcalcifications--repeat L diag mammo 03/24/2017.  COMPLEX SCLEROSING LESION WITH CALCIFICATIONS --no sign of malignancy.  Excision recommended--pt has been referred to Dr. Donne Hazel, who recommended f/u mammo and this was normal 12/2017--consider rpt 1 yr per rad.  . Arthritis    DJD, back and both hips  . Chronic low back pain without sciatica    Lumbar spondylosis + scoliosis.  Summer 2017 Dr. Nelva Bush did ESI and pt got no relief.  Pt then saw Dr. Maia Petties [facility]: felt she had facet mediated pain; plan for B L345 MBB (??).  Marland Kitchen Diverticulosis    on colonoscopy 2004  . GERD (gastroesophageal reflux disease)   . Hx of cardiovascular stress test    Lexiscan Myoview (2/14):  Low risk, small ant defect likely breast attenuation, no ischemia, EF 69% (no change from 2010).    . Hyperlipidemia   . Hypertension    per pt  . Idiopathic scoliosis of thoracolumbar region   . Lumbar back pain   . Mitral regurgitation    Echo (2/14):  EF 60-65%, Gr 1 DD, mild AI, mild bileaflet MVP, mod post directed MR, mild LAE, PASP 35.  Marland Kitchen MVP (mitral valve prolapse)   . 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  Responding to gabapentin (Dr. Brett Fairy).  . Osteoporosis, senile    stable at the left hip 2014-2015 (T-score -2.7 when she stopped bisphosphonate).  Took fosamax for 10 yrs.  Repeat DEXA 76mo later (09/2016) T-score -.3.1 (different machine, though).  Marland Kitchen PAF (paroxysmal atrial fibrillation) (HCC)    Dr. Martinique: Flecainide, metopr, apxiaban.  08/2017-->persistent a-fib w/RVR, flecainide increased and she converted back to NSR and felt better.  100mg  bid flecainide continued.  . Parkinson's disease ([place]) 2020 dx   started sinemet 05/2019 (Dr. Carles Collet)  . Peripheral neuropathy 2008   Idiopathic vs familial (motor>sensory) vs lumbar disc dz: gabapentin helpful  . Shoulder pain left   RC surg 09/2014    Past Surgical History:  Procedure Laterality Date  . BREAST BIOPSY  04/10/2017   Left breast core needle biopsy of calcifications.  COMPLEX SCLEROSING LESION WITH CALCIFICATIONS --no sign of malignancy.  Excision recommended--pt has been referred to Dr. Donne Hazel, who recommended rpt mammo and this was NORMAL 12/2017.  Marland Kitchen BREAST EXCISIONAL BIOPSY Right 2010   Benign  . BREAST SURGERY  Nov 2010   Benign biopsy, right  . CHOLECYSTECTOMY  03/2010   Dr.Gross  . COLONOSCOPY  10/03/2002   No polyps.  Repeat 10 yrs recommended but pt  declines.  Pt declined cologuard 08/2016 but changed her mind and this test was NEG on 03/23/17.  . COMBINED HYSTERECTOMY VAGINAL / OOPHORECTOMY / A&P REPAIR  0000000   uncertain if ovaries were removed or not  . CYSTOCELE REPAIR    . Lumpectomy  06/2009   "Fatty Necrosis"  . PFT's  2014   NORMAL  . REVERSE SHOULDER ARTHROPLASTY Right 03/11/2019   Procedure: REVERSE TOTAL SHOULDER ARTHROPLASTY;  Surgeon: Netta Cedars, MD;  Location: [facility];  Service: Orthopedics;  Laterality: Right;  Intersclene block  . ROTATOR CUFF REPAIR Left 10/02/14   Dr. Veverly Fells  . TONSILLECTOMY    . TOTAL KNEE ARTHROPLASTY  09/2009   Right ;Dr Alvan Dame   . TOTAL KNEE ARTHROPLASTY Left 03/10/2016   Procedure: TOTAL KNEE ARTHROPLASTY;  Surgeon: Paralee Cancel, MD;  Location: [facility];  Service: Orthopedics;  Laterality: Left;  . TRANSTHORACIC ECHOCARDIOGRAM  09/2012; 09/27/15   2014: EF 60-65%, Gr 1 DD, mild AI, mild bileaflet MVP, mod post directed MR, mild LAE, PASP 35.  Repeat 2017: EF 55-60%, mild AR, mild MVP and mild MV regurg.  Marland Kitchen VAGINAL HYSTERECTOMY     For Uterine Deviation     There were no vitals filed for this visit.  Subjective Assessment - 06/08/19 1038    Subjective  COVID-19 screening performed upon arrival. 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  10-15 mins    How long can you walk comfortably?  10-15 mins    Patient Stated Goals  get stronger    Currently in Pain?  No/denies                       [facility] Adult PT Treatment/Exercise - 06/08/19 0001      Lumbar Exercises: Aerobic   Nustep  L4 x60min LO and left UE only      Knee/Hip Exercises: Seated   Long Arc Quad  Strengthening;Both;2 sets;10 reps    Long Arc Quad Weight  2 lbs.    Marching  Strengthening;Both;2 sets;10 reps;Weights    Marching Weights  2 lbs.          Balance Exercises - 06/08/19 1052      Balance Exercises: Standing   Standing Eyes Opened  Wide (BOA);Foam/compliant surface;Time   60min   Standing, One Foot on a Step  Eyes open;6 inch;5 reps;1 rep;10 secs    Rockerboard  Anterior/posterior   52min   Heel Raises Limitations  2x10    Toe Raise Limitations  2x10    Sit to Stand Time  x5             PT Long Term Goals - 06/01/19 1136      PT LONG TERM GOAL #1   Title  Patient will be independent  with a HEP.    Time  6    Period  Weeks    Status  On-going      PT LONG TERM GOAL #2   Title  Patient will report ability to stand for 20 minutes or greater with no loss of balance to safely perform home activities.    Time  6    Period  Weeks  Status  Achieved   able to perform activities for 16min at a time with no LOB 06/01/19     PT LONG TERM GOAL #3   Title  Patient will report ability to walk with an AD for 20 minutes or greater to improve cardiovascular health and muscular endurance.    Time  6    Period  Weeks    Status  On-going      PT LONG TERM GOAL #4   Title  Patient will decrease risk of fall by performing modified 5x sit to stand in 11 seconds or less.    Time  6    Period  Weeks    Status  On-going   x3 in 11 seconds 06/01/19     PT LONG TERM GOAL #5   Title  Patient will decrease risk of falls by scoring 40/56 on Berg Balance Scale    Time  6    Period  Weeks    Status  On-going            Plan - 06/08/19 1111    Clinical Impression Statement  Patient tolerated treatment well today. Patient request a short treatment due to another appt. Patient able to progress with standing balance activities with minimal LOB. No falls reported. Patient current goals ongoing at this time due to strength and balance limitations. Patient went to MD due to she right shoulder pain and she is able to use it as able, "it will not get much better" per patients MD.Patient was able to use bil UE today with no pain.    Personal Factors and Comorbidities  Age;Comorbidity 3+    Comorbidities  Parkinson's Disease (Dx September 2020) A Fib, neuropathy, osteoporosis.    Examination-Activity Limitations  Stand;Lift    Examination-Participation Restrictions  Cleaning;Meal Prep    Stability/Clinical Decision Making  Evolving/Moderate complexity    Rehab Potential  Good    PT Frequency  2x / week    PT Duration  6 weeks    PT Treatment/Interventions  ADLs/Self Care Home  Management;Cryotherapy;Electrical Stimulation;Moist Heat;Balance training;Therapeutic exercise;Therapeutic activities;Functional mobility training;Stair training;Gait training;Neuromuscular re-education;Patient/family education;Passive range of motion;Manual techniques    PT Next Visit Plan  cont with POC for LE strengthening, balance in sitting and standing, be careful exercising with right UE (reverse TSA)    Consulted and Agree with Plan of Care  Patient       Patient will benefit from skilled therapeutic intervention in order to improve the following deficits and impairments:  Abnormal gait, Difficulty walking, Decreased balance, Decreased activity tolerance, Decreased strength, Postural dysfunction, Pain, Decreased endurance, Decreased safety awareness  Visit Diagnosis: Muscle weakness (generalized)  Unsteadiness on feet  Repeated falls     Problem List Patient Active Problem List   Diagnosis Date Noted  . Aftercare 04/25/2019  . 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 Pain in joint of right shoulder 01/10/2019  . Pain in both feet 12/14/2017  . Neuropathy 12/14/2017  . Benign essential tremor 12/14/2017  . Paroxysmal atrial fibrillation ([place]) 12/14/2017  . Chronic low back pain without sciatica 12/14/2017  . 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  . Snoring 05/19/2017  . 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 Female bladder prolapse 12/06/2015  . Paroxysmal atrial fibrillation with rapid ventricular response ([place]) 09/28/2015  . Maxillary sinusitis, acute 08/27/2015  . Mitral insufficiency 05/12/2014  . HTN (hypertension) 04/28/2014  . Lactic acidosis 09/07/2013  . Mitral valve prolapse 06/14/2013  . Chest pain, exertional 09/13/2012  .  Pernicious anemia 09/07/2012  . DIVERTICULOSIS, COLON 09/06/2009  . Osteoporosis 09/06/2009  . Hyperlipidemia 09/04/2009  . Peripheral sensory neuropathy 09/04/2009  . LOW BACK PAIN, CHRONIC 04/08/2007    Cassie White  P, PTA 06/08/2019, 11:20 AM  [facility] [place], [place], [address] Phone: [phone]   Fax:  [phone]  Name: Cassie White MRN: [id number] Date of Birth: [birth year]

## 2019-06-09 ENCOUNTER — Ambulatory Visit: Payer: Medicare Other | Admitting: Family Medicine

## 2019-06-15 ENCOUNTER — Ambulatory Visit: Payer: Medicare Other | Admitting: Physical Therapy

## 2019-06-15 ENCOUNTER — Encounter: Payer: Self-pay | Admitting: Physical Therapy

## 2019-06-15 ENCOUNTER — Other Ambulatory Visit: Payer: Self-pay

## 2019-06-15 DIAGNOSIS — R2681 Unsteadiness on feet: Secondary | ICD-10-CM

## 2019-06-15 DIAGNOSIS — R296 Repeated falls: Secondary | ICD-10-CM | POA: Diagnosis not present

## 2019-06-15 DIAGNOSIS — M6281 Muscle weakness (generalized): Secondary | ICD-10-CM

## 2019-06-15 NOTE — Therapy (Signed)
[facility] [place], [place], [address] Phone: [phone]   Fax:  [phone]  Physical Therapy Treatment  Patient Details  Name: Cassie White MRN: [id number] Date of Birth: 09-05-1940 Referring Provider (PT): Alonza Bogus, DO   Encounter Date: 06/15/2019  PT End of Session - 06/15/19 0932    Visit Number  6    Number of Visits  12    Date for PT Re-Evaluation  06/24/19    Authorization Type  Progress note at 10th visit    PT Start Time  0901    PT Stop Time  0942    PT Time Calculation (min)  41 min    Activity Tolerance  Patient tolerated treatment well    Behavior During Therapy  [facility] for tasks assessed/performed       Past Medical History:  Diagnosis Date  . Abnormal mammogram of left breast    Likely benign microcalcifications--repeat L diag mammo 03/24/2017.  COMPLEX SCLEROSING LESION WITH CALCIFICATIONS --no sign of malignancy.  Excision recommended--pt has been referred to Dr. Donne Hazel, who recommended f/u mammo and this was normal 12/2017--consider rpt 1 yr per rad.  . Arthritis    DJD, back and both hips  . Chronic low back pain without sciatica    Lumbar spondylosis + scoliosis.  Summer 2017 Dr. Nelva Bush did ESI and pt got no relief.  Pt then saw Dr. Maia Petties with [facility]: felt she had facet mediated pain; plan for B L345 MBB (??).  Marland Kitchen Diverticulosis    on colonoscopy 2004  . GERD (gastroesophageal reflux disease)   . Hx of cardiovascular stress test    Lexiscan Myoview (2/14):  Low risk, small ant defect likely breast attenuation, no ischemia, EF 69% (no change from 2010).    . Hyperlipidemia   . Hypertension    per pt  . Idiopathic scoliosis of thoracolumbar region   . Lumbar back pain   . Mitral regurgitation    Echo (2/14):  EF 60-65%, Gr 1 DD, mild AI, mild bileaflet MVP, mod post directed MR, mild LAE, PASP 35.  Marland Kitchen MVP (mitral valve prolapse)   . 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  Responding to gabapentin (Dr. Brett Fairy).  . Osteoporosis, senile    stable at the left hip 2014-2015 (T-score -2.7 when she stopped bisphosphonate).  Took fosamax for 10 yrs.  Repeat DEXA 65mo later (09/2016) T-score -.3.1 (different machine, though).  Marland Kitchen PAF (paroxysmal atrial fibrillation) (HCC)    Dr. Martinique: Flecainide, metopr, apxiaban.  08/2017-->persistent a-fib w/RVR, flecainide increased and she converted back to NSR and felt better.  100mg  bid flecainide continued.  . Parkinson's disease ([place]) 2020 dx   started sinemet 05/2019 (Dr. Carles Collet)  . Peripheral neuropathy 2008   Idiopathic vs familial (motor>sensory) vs lumbar disc dz: gabapentin helpful  . Shoulder pain left   RC surg 09/2014    Past Surgical History:  Procedure Laterality Date  . BREAST BIOPSY  04/10/2017   Left breast core needle biopsy of calcifications.  COMPLEX SCLEROSING LESION WITH CALCIFICATIONS --no sign of malignancy.  Excision recommended--pt has been referred to Dr. Donne Hazel, who recommended rpt mammo and this was NORMAL 12/2017.  Marland Kitchen BREAST EXCISIONAL BIOPSY Right 2010   Benign  . BREAST SURGERY  Nov 2010   Benign biopsy, right  . CHOLECYSTECTOMY  03/2010   Dr.Gross  . COLONOSCOPY  10/03/2002   No polyps.  Repeat 10 yrs recommended but pt  declines.  Pt declined cologuard 08/2016 but changed her mind and this test was NEG on 03/23/17.  . COMBINED HYSTERECTOMY VAGINAL / OOPHORECTOMY / A&P REPAIR  0000000   uncertain if ovaries were removed or not  . CYSTOCELE REPAIR    . Lumpectomy  06/2009   "Fatty Necrosis"  . PFT's  2014   NORMAL  . REVERSE SHOULDER ARTHROPLASTY Right 03/11/2019   Procedure: REVERSE TOTAL SHOULDER ARTHROPLASTY;  Surgeon: Netta Cedars, MD;  Location: [facility];  Service: Orthopedics;  Laterality: Right;  Intersclene block  . ROTATOR CUFF REPAIR Left 10/02/14   Dr. Veverly Fells  . TONSILLECTOMY    . TOTAL KNEE ARTHROPLASTY  09/2009   Right ;Dr Alvan Dame   . TOTAL KNEE ARTHROPLASTY Left 03/10/2016   Procedure: TOTAL KNEE ARTHROPLASTY;  Surgeon: Paralee Cancel, MD;  Location: [facility];  Service: Orthopedics;  Laterality: Left;  . TRANSTHORACIC ECHOCARDIOGRAM  09/2012; 09/27/15   2014: EF 60-65%, Gr 1 DD, mild AI, mild bileaflet MVP, mod post directed MR, mild LAE, PASP 35.  Repeat 2017: EF 55-60%, mild AR, mild MVP and mild MV regurg.  Marland Kitchen VAGINAL HYSTERECTOMY     For Uterine Deviation     There were no vitals filed for this visit.  Subjective Assessment - 06/15/19 0909    Subjective  COVID-19 screening performed upon arrival. Patient reported no new complaints and tolerated treatment well last week    Pertinent History  Parkinson's Disease, R reverse total shoulder replacement 03/11/2019, left TKA, A-Fib, osteoporosis, neuropathy    Limitations  Lifting;Standing;House hold activities;Walking    How long can you stand comfortably?  10-15 mins    How long can you walk comfortably?  10-15 mins    Patient Stated Goals  get stronger    Currently in Pain?  No/denies                       [facility] Adult PT Treatment/Exercise - 06/15/19 0001      Lumbar Exercises: Aerobic   Nustep  L5 x32min UE/LE activity      Lumbar Exercises: Seated   Other Seated Lumbar Exercises  seated core activation with grey ball for elevation and diagnols 2x10 each      Knee/Hip Exercises: Seated   Long Arc Quad  Strengthening;Both;2 sets;10 reps    Long Arc Quad Weight  3 lbs.    Clamshell with TheraBand  Red   x20   Marching  Strengthening;Both;2 sets;10 reps;Weights    Marching Weights  3 lbs.    Hamstring Curl  Strengthening;Both;20 reps    Hamstring Limitations  red t-band          Balance Exercises - 06/15/19 0930      Balance Exercises: Standing   Standing Eyes Opened  Wide (BOA);Foam/compliant surface;1 rep;Time   23min   Tandem Stance  Eyes open;4 reps;Intermittent upper extremity support;10 secs   tandem step   Rockerboard   Anterior/posterior   72min   Step Ups  Forward;6 inch;UE support 2   2x10   Heel Raises Limitations  2x10    Toe Raise Limitations  2x10    Sit to Stand Time  x10             PT Long Term Goals - 06/15/19 [id number]      PT LONG TERM GOAL #1   Title  Patient will be independent with a HEP.    Time  6    Period  Weeks  Status  On-going      PT LONG TERM GOAL #2   Title  Patient will report ability to stand for 20 minutes or greater with no loss of balance to safely perform home activities.    Time  6    Period  Weeks    Status  Achieved      PT LONG TERM GOAL #3   Title  Patient will report ability to walk with an AD for 20 minutes or greater to improve cardiovascular health and muscular endurance.    Period  Weeks    Status  On-going   65min 06/15/19     PT LONG TERM GOAL #4   Title  Patient will decrease risk of fall by performing modified 5x sit to stand in 11 seconds or less.    Time  6    Period  Weeks    Status  On-going      PT LONG TERM GOAL #5   Title  Patient will decrease risk of falls by scoring 40/56 on Berg Balance Scale    Time  6    Period  Weeks    Status  On-going            Plan - 06/15/19 [id number]    Clinical Impression Statement  Patient tolerated treatment well today. Ptient able to perform all activities with no pain reported. No falls reported at this time. Patient able to walk yet only for 10 min at a time. Patient current goals ongoing at this time.    Personal Factors and Comorbidities  Age;Comorbidity 3+    Comorbidities  Parkinson's Disease (Dx September 2020) A Fib, neuropathy, osteoporosis.    Examination-Activity Limitations  Stand;Lift    Examination-Participation Restrictions  Cleaning;Meal Prep    Stability/Clinical Decision Making  Evolving/Moderate complexity    Rehab Potential  Good    PT Frequency  2x / week    PT Duration  6 weeks    PT Treatment/Interventions  ADLs/Self Care Home Management;Cryotherapy;Electrical  Stimulation;Moist Heat;Balance training;Therapeutic exercise;Therapeutic activities;Functional mobility training;Stair training;Gait training;Neuromuscular re-education;Patient/family education;Passive range of motion;Manual techniques    PT Next Visit Plan  cont with POC for LE strengthening, balance in sitting and standing, be careful exercising with right UE (reverse TSA)    Consulted and Agree with Plan of Care  Patient       Patient will benefit from skilled therapeutic intervention in order to improve the following deficits and impairments:  Abnormal gait, Difficulty walking, Decreased balance, Decreased activity tolerance, Decreased strength, Postural dysfunction, Pain, Decreased endurance, Decreased safety awareness  Visit Diagnosis: Muscle weakness (generalized)  Unsteadiness on feet  Repeated falls     Problem List Patient Active Problem List   Diagnosis Date Noted  . Aftercare 04/25/2019  . 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 Pain in joint of right shoulder 01/10/2019  . Pain in both feet 12/14/2017  . Neuropathy 12/14/2017  . Benign essential tremor 12/14/2017  . Paroxysmal atrial fibrillation ([place]) 12/14/2017  . Chronic low back pain without sciatica 12/14/2017  . 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  . Snoring 05/19/2017  . 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 Female bladder prolapse 12/06/2015  . Paroxysmal atrial fibrillation with rapid ventricular response ([place]) 09/28/2015  . Maxillary sinusitis, acute 08/27/2015  . Mitral insufficiency 05/12/2014  . HTN (hypertension) 04/28/2014  . Lactic acidosis 09/07/2013  . Mitral valve prolapse 06/14/2013  . Chest pain, exertional 09/13/2012  . Pernicious anemia 09/07/2012  . DIVERTICULOSIS, COLON  09/06/2009  . Osteoporosis 09/06/2009  . Hyperlipidemia 09/04/2009  . Peripheral sensory neuropathy 09/04/2009  . LOW BACK PAIN, CHRONIC 04/08/2007    Cassie White P, PTA 06/15/2019, 9:42  AM  [facility] [place], [place], [address] Phone: [phone]   Fax:  [phone]  Name: Cassie White MRN: [id number] Date of Birth: 10-18-1940

## 2019-06-16 ENCOUNTER — Ambulatory Visit (INDEPENDENT_AMBULATORY_CARE_PROVIDER_SITE_OTHER): Payer: Medicare Other | Admitting: Family Medicine

## 2019-06-16 ENCOUNTER — Encounter: Payer: Self-pay | Admitting: Family Medicine

## 2019-06-16 VITALS — BP 116/72 | HR 54 | Temp 98.3°F | Resp 16 | Ht 62.0 in | Wt 152.2 lb

## 2019-06-16 DIAGNOSIS — R2681 Unsteadiness on feet: Secondary | ICD-10-CM | POA: Diagnosis not present

## 2019-06-16 DIAGNOSIS — G20 Parkinson's disease: Secondary | ICD-10-CM

## 2019-06-16 DIAGNOSIS — R011 Cardiac murmur, unspecified: Secondary | ICD-10-CM | POA: Diagnosis not present

## 2019-06-16 DIAGNOSIS — G609 Hereditary and idiopathic neuropathy, unspecified: Secondary | ICD-10-CM

## 2019-06-16 DIAGNOSIS — I4819 Other persistent atrial fibrillation: Secondary | ICD-10-CM | POA: Diagnosis not present

## 2019-06-16 DIAGNOSIS — R251 Tremor, unspecified: Secondary | ICD-10-CM | POA: Diagnosis not present

## 2019-06-16 MED ORDER — GABAPENTIN 300 MG PO CAPS: ORAL_CAPSULE | ORAL | 3 refills | Status: DC

## 2019-06-16 NOTE — Progress Notes (Signed)
OFFICE VISIT  06/16/2019   CC:  Chief Complaint  Patient presents with  . Follow-up    RCI, pt is not fasting   HPI:    Patient is a 78 y.o. Caucasian female who presents for 2 mo f/u for gait abnormality and tremor. She has seen Dr. Carles Collet since last f/u with me and was dx'd with parkinson's dz.  Was started on sinemet last month.  She notes no diff in tremor since starting sinemet.  No side effects noted. Has f/u with Dr. Carles Collet in about 3 mo. No new neuro/musculo sx's. She feels improved energy leve, balance, and strength since last visit.  Has been getting PT in [place]. Asks for RF of her gabapentin which she finds helpful for her idiopathic LL PN sx's.  Takes 2 of the 300 mg caps bid.  ROS: no CP, no SOB, no wheezing, no cough, no dizziness, no HAs, no rashes, no melena/hematochezia.  No polyuria or polydipsia.  No myalgias or arthralgias. No vision or hearing complaints.  No n/v/d or constipation.  No abd pain.  Past Medical History:  Diagnosis Date  . Abnormal mammogram of left breast    Likely benign microcalcifications--repeat L diag mammo 03/24/2017.  COMPLEX SCLEROSING LESION WITH CALCIFICATIONS --no sign of malignancy.  Excision recommended--pt has been referred to Dr. Donne Hazel, who recommended f/u mammo and this was normal 12/2017--consider rpt 1 yr per rad.  . Arthritis    DJD, back and both hips  . Chronic low back pain without sciatica    Lumbar spondylosis + scoliosis.  Summer 2017 Dr. Nelva Bush did ESI and pt got no relief.  Pt then saw Dr. Maia Petties [facility]: felt she had facet mediated pain; plan for B L345 MBB (??).  Marland Kitchen Diverticulosis    on colonoscopy 2004  . GERD (gastroesophageal reflux disease)   . Hx of cardiovascular stress test    Lexiscan Myoview (2/14):  Low risk, small ant defect likely breast attenuation, no ischemia, EF 69% (no change from 2010).    . Hyperlipidemia   . Hypertension    per pt  . Idiopathic scoliosis of thoracolumbar region    . Lumbar back pain   . Mitral regurgitation    Echo (2/14):  EF 60-65%, Gr 1 DD, mild AI, mild bileaflet MVP, mod post directed MR, mild LAE, PASP 35.  Marland Kitchen MVP (mitral valve prolapse)   . 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  Responding to gabapentin (Dr. Brett Fairy).  . Osteoporosis, senile    stable at the left hip 2014-2015 (T-score -2.7 when she stopped bisphosphonate).  Took fosamax for 10 yrs.  Repeat DEXA 78mo later (09/2016) T-score -.3.1 (different machine, though).  Marland Kitchen PAF (paroxysmal atrial fibrillation) (HCC)    Dr. [place]: Flecainide, metopr, apxiaban.  08/2017-->persistent a-fib w/RVR, flecainide increased and she converted back to NSR and felt better.  100mg  bid flecainide continued.  . Parkinson's disease ([place]) 2020 dx   started sinemet 05/2019 (Dr. Carles Collet)  . Peripheral neuropathy 2008   Idiopathic vs familial (motor>sensory) vs lumbar disc dz: gabapentin helpful  . Shoulder pain left   RC surg 09/2014    Past Surgical History:  Procedure Laterality Date  . BREAST BIOPSY  04/10/2017   Left breast core needle biopsy of calcifications.  COMPLEX SCLEROSING LESION WITH CALCIFICATIONS --no sign of malignancy.  Excision recommended--pt has been referred to Dr. Donne Hazel, who recommended rpt mammo and this was NORMAL 12/2017.  Marland Kitchen BREAST EXCISIONAL  BIOPSY Right 2010   Benign  . BREAST SURGERY  Nov 2010   Benign biopsy, right  . CHOLECYSTECTOMY  03/2010   Dr.Gross  . COLONOSCOPY  10/03/2002   No polyps.  Repeat 10 yrs recommended but pt declines.  Pt declined cologuard 08/2016 but changed her mind and this test was NEG on 03/23/17.  . COMBINED HYSTERECTOMY VAGINAL / OOPHORECTOMY / A&P REPAIR  0000000   uncertain if ovaries were removed or not  . CYSTOCELE REPAIR    . Lumpectomy  06/2009   "Fatty Necrosis"  . PFT's  2014   NORMAL  . REVERSE SHOULDER ARTHROPLASTY Right 03/11/2019   Procedure: REVERSE TOTAL SHOULDER ARTHROPLASTY;   Surgeon: Netta Cedars, MD;  Location: [facility];  Service: Orthopedics;  Laterality: Right;  Intersclene block  . ROTATOR CUFF REPAIR Left 10/02/14   Dr. Veverly Fells  . TONSILLECTOMY    . TOTAL KNEE ARTHROPLASTY  09/2009   Right ;Dr Alvan Dame  . TOTAL KNEE ARTHROPLASTY Left 03/10/2016   Procedure: TOTAL KNEE ARTHROPLASTY;  Surgeon: Paralee Cancel, MD;  Location: [facility];  Service: Orthopedics;  Laterality: Left;  . TRANSTHORACIC ECHOCARDIOGRAM  09/2012; 09/27/15   2014: EF 60-65%, Gr 1 DD, mild AI, mild bileaflet MVP, mod post directed MR, mild LAE, PASP 35.  Repeat 2017: EF 55-60%, mild AR, mild MVP and mild MV regurg.  Marland Kitchen VAGINAL HYSTERECTOMY     For Uterine Deviation     Outpatient Medications Prior to Visit  Medication Sig Dispense Refill  . apixaban (ELIQUIS) 5 MG TABS tablet Take 1 tablet (5 mg total) by mouth 2 (two) times daily. 180 tablet 1  . Biotin 5000 MCG CAPS Take 5,000 mcg by mouth daily.    . Calcium Carbonate (CALCIUM 600 PO) Take 600 mg by mouth 2 (two) times daily.     . carbidopa-levodopa (SINEMET IR) 25-100 MG tablet Take 1 tablet by mouth 3 (three) times daily. 270 tablet 1  . cyanocobalamin (,VITAMIN B-12,) 1000 MCG/ML injection INJECT 1 ML (1,000 MCG TOTAL) INTO THE MUSCLE EVERY 30 (THIRTY) DAYS. 10 mL 1  . Fexofenadine HCl (MUCINEX ALLERGY PO) Take by mouth.    . flecainide (TAMBOCOR) 100 MG tablet Take 1 tablet (100 mg total) by mouth 2 (two) times daily. 180 tablet 3  . HYDROcodone-acetaminophen (NORCO/VICODIN) 5-325 MG tablet Take 1 tablet by mouth every 8 (eight) hours. 15 tablet 0  . metoprolol tartrate (LOPRESSOR) 25 MG tablet TAKE 1 TABLET BY MOUTH TWICE A DAY (Patient taking differently: Take 25 mg by mouth 2 (two) times daily. ) 180 tablet 1  . nitroGLYCERIN (NITROSTAT) 0.4 MG SL tablet Place 1 tablet (0.4 mg total) under the tongue every 5 (five) minutes as needed for chest pain. 25 tablet 5  . Omega-3 Fatty Acids (FISH OIL) 1200 MG CAPS Take 1,000 mg by mouth 2 (two) times  daily.     . pravastatin (PRAVACHOL) 40 MG tablet Take 1 tablet (40 mg total) by mouth daily. 90 tablet 3  . gabapentin (NEURONTIN) 300 MG capsule TAKE 1 CAPSULE BY MOUTH AT LUNCH, 1 AT DINNER, AND 2 AT BEDTIME 360 capsule 0  . primidone (MYSOLINE) 50 MG tablet 50 mg 2 (two) times daily.      No facility-administered medications prior to visit.     Allergies  Allergen Reactions  . Sulfa Antibiotics Swelling    face  . Tizanidine Other (See Comments)    delirium    ROS As per HPI  PE: Blood pressure  116/72, pulse (!) 54, temperature 98.3 F (36.8 C), temperature source Temporal, resp. rate 16, height 5\' 2"  (1.575 m), weight 152 lb 3.2 oz (69 kg), SpO2 93 %. Body mass index is 27.84 kg/m.  Gen: Alert, well appearing.  Patient is oriented to person, place, time, and situation. AFFECT: pleasant, lucid thought and speech. CV: RRR, [id number] holosystolic murmur, S1 obscured but S2 distinct, no diastolic murmur, no r/g.   LUNGS: CTA bilat, nonlabored resps, good aeration in all lung fields. Neuro: fine tremor in R hand/fingers with arms outstretched and resting on lap.   No focal weakness.  Face with normal range of emotional expression. No cogwheel rigidity.   LABS:    Chemistry      Component Value Date/Time   NA 139 03/12/2019 0253   NA 145 (H) 06/02/2018 1514   K 4.2 03/12/2019 0253   CL 108 03/12/2019 0253   CO2 25 03/12/2019 0253   BUN 10 03/12/2019 0253   BUN 18 06/02/2018 1514   CREATININE 0.90 03/12/2019 0253      Component Value Date/Time   CALCIUM 8.8 (L) 03/12/2019 0253   ALKPHOS 64 09/04/2017 1135   AST 16 09/04/2017 1135   ALT 14 09/04/2017 1135   BILITOT 0.8 09/04/2017 1135     Lab Results  Component Value Date   WBC 4.9 03/09/2019   HGB 12.3 03/12/2019   HCT 37.4 03/12/2019   MCV 97.0 03/09/2019   PLT 166 03/09/2019   Lab Results  Component Value Date   TSH 1.32 04/21/2019   Lab Results  Component Value Date   CHOL 186 03/26/2018   HDL 75.90  03/26/2018   LDLCALC 83 03/26/2018   LDLDIRECT 87.5 09/07/2012   TRIG 133.0 03/26/2018   CHOLHDL 2 03/26/2018   Lab Results  Component Value Date   VITAMINB12 1,250 (H) 04/28/2014    IMPRESSION AND PLAN:  1) tremor->parkinsons's dz, no change on current sinemet dosing. She is feeling fine at this time, though, so I encouraged her to continue current dose and keep f/u with Dr. Carles Collet planned for 09/2019. Otherwise doesn't seem to display other sx's of parkinson's at this time.  2) Chronic debilitation/gait instability (mild): improving ever since getting into PT, sx's mild at this time. Continue PT.  3) Idiopathic PN: doing fine on gabapentin 600 mg bid.  RF'd gabapentin today.  4) Systolic heart murmur: [id number] echo documented mild MR with MVP.  Normal systolic function, grd 1 DD. No new testing indicated at this time.  5) Persistent A-fib: stable on flecainide (regular rhythm today) and eliquis. Plan CBC/lytes in 6 mo.  An After Visit Summary was printed and given to the patient.  FOLLOW UP: Return in about 6 months (around 12/14/2019) for routine chronic illness f/u.  Signed:  Crissie Sickles, MD           06/16/2019

## 2019-06-21 ENCOUNTER — Ambulatory Visit: Payer: Medicare Other | Admitting: Physical Therapy

## 2019-06-23 ENCOUNTER — Ambulatory Visit: Payer: Medicare Other | Admitting: Physical Therapy

## 2019-08-11 ENCOUNTER — Other Ambulatory Visit: Payer: Self-pay | Admitting: Family Medicine

## 2019-08-11 DIAGNOSIS — I4891 Unspecified atrial fibrillation: Secondary | ICD-10-CM

## 2019-09-10 ENCOUNTER — Other Ambulatory Visit: Payer: Self-pay | Admitting: Cardiology

## 2019-09-11 ENCOUNTER — Other Ambulatory Visit: Payer: Self-pay | Admitting: Neurology

## 2019-09-12 DIAGNOSIS — H52223 Regular astigmatism, bilateral: Secondary | ICD-10-CM | POA: Diagnosis not present

## 2019-09-12 DIAGNOSIS — H43811 Vitreous degeneration, right eye: Secondary | ICD-10-CM | POA: Diagnosis not present

## 2019-09-12 DIAGNOSIS — H5203 Hypermetropia, bilateral: Secondary | ICD-10-CM | POA: Diagnosis not present

## 2019-09-12 DIAGNOSIS — H524 Presbyopia: Secondary | ICD-10-CM | POA: Diagnosis not present

## 2019-09-12 DIAGNOSIS — H25813 Combined forms of age-related cataract, bilateral: Secondary | ICD-10-CM | POA: Diagnosis not present

## 2019-09-19 NOTE — Progress Notes (Deleted)
Cassie White was seen today in follow up for Parkinsons disease.  My previous records were reviewed prior to todays visit, as well as those from Dr. Anitra Lauth. Pt denies falls.  Has attended physical therapy since last visit, although appears that she only went to 2 visits.  Pt denies lightheadedness, near syncope.  No hallucinations.  Mood has been good.  Current prescribed movement disorder medications: ***Carbidopa/levodopa 25/100, 1 tablet 3 times per day (started last visit)   PREVIOUS MEDICATIONS: {Parkinson's RX:18200}  ALLERGIES:   Allergies  Allergen Reactions  . Sulfa Antibiotics Swelling    face  . Tizanidine Other (See Comments)    delirium    CURRENT MEDICATIONS:  Outpatient Encounter Medications as of 09/22/2019  Medication Sig  . metoprolol tartrate (LOPRESSOR) 25 MG tablet TAKE 1 TABLET BY MOUTH TWICE A DAY  . Biotin 5000 MCG CAPS Take 5,000 mcg by mouth daily.  . Calcium Carbonate (CALCIUM 600 PO) Take 600 mg by mouth 2 (two) times daily.   . carbidopa-levodopa (SINEMET IR) 25-100 MG tablet TAKE 1 TABLET BY MOUTH THREE TIMES A DAY  . cyanocobalamin (,VITAMIN B-12,) 1000 MCG/ML injection INJECT 1 ML (1,000 MCG TOTAL) INTO THE MUSCLE EVERY 30 (THIRTY) DAYS.  Marland Kitchen ELIQUIS 5 MG TABS tablet TAKE 1 TABLET BY MOUTH TWICE A DAY  . Fexofenadine HCl (MUCINEX ALLERGY PO) Take by mouth.  . flecainide (TAMBOCOR) 100 MG tablet Take 1 tablet (100 mg total) by mouth 2 (two) times daily.  Marland Kitchen gabapentin (NEURONTIN) 300 MG capsule TAKE 1 CAPSULE BY MOUTH AT LUNCH, 1 AT DINNER, AND 2 AT BEDTIME  . HYDROcodone-acetaminophen (NORCO/VICODIN) 5-325 MG tablet Take 1 tablet by mouth every 8 (eight) hours.  . nitroGLYCERIN (NITROSTAT) 0.4 MG SL tablet Place 1 tablet (0.4 mg total) under the tongue every 5 (five) minutes as needed for chest pain.  . Omega-3 Fatty Acids (FISH OIL) 1200 MG CAPS Take 1,000 mg by mouth 2 (two) times daily.   . pravastatin (PRAVACHOL) 40 MG tablet Take 1 tablet (40  mg total) by mouth daily.   No facility-administered encounter medications on file as of 09/22/2019.    PHYSICAL EXAMINATION:    VITALS:  There were no vitals filed for this visit.  GEN:  The patient appears stated age and is in NAD. HEENT:  Normocephalic, atraumatic.  The mucous membranes are moist. The superficial temporal arteries are without ropiness or tenderness. CV:  RRR Lungs:  CTAB Neck/HEME:  There are no carotid bruits bilaterally.  Neurological examination:  Orientation: The patient is alert and oriented x3. Cranial nerves: There is good facial symmetry with*** facial hypomimia. The speech is fluent and clear. Soft palate rises symmetrically and there is no tongue deviation. Hearing is intact to conversational tone. Sensation: Sensation is intact to light touch throughout Motor: Strength is at least antigravity x4.  Movement examination: Tone: There is ***tone in the *** Abnormal movements: *** Coordination:  There is *** decremation with RAM's, *** Gait and Station: The patient has *** difficulty arising out of a deep-seated chair without the use of the hands. The patient's stride length is ***.  The patient has a *** pull test.      ASSESSMENT/PLAN:  ***1.  Parkinsons Disease  -Levodopa started in September, 2020.  -We discussed that it used to be thought that levodopa would increase risk of melanoma but now it is believed that Parkinsons itself likely increases risk of melanoma. she is to get regular skin checks.  Total time spent on today's visit was ***greater than 30 minutes, including both face-to-face time and nonface-to-face time.  Time included that spent on review of records (prior notes available to me/labs/imaging if pertinent), discussing treatment and goals, answering patient's questions and coordinating care.  Cc:  Tammi Sou, MD

## 2019-09-22 ENCOUNTER — Ambulatory Visit: Payer: Medicare Other | Admitting: Neurology

## 2019-10-24 ENCOUNTER — Other Ambulatory Visit: Payer: Self-pay | Admitting: General Surgery

## 2019-10-24 DIAGNOSIS — Z1231 Encounter for screening mammogram for malignant neoplasm of breast: Secondary | ICD-10-CM

## 2019-11-03 HISTORY — PX: OTHER SURGICAL HISTORY: SHX169

## 2019-11-09 ENCOUNTER — Telehealth: Payer: Self-pay

## 2019-11-09 ENCOUNTER — Other Ambulatory Visit: Payer: Self-pay

## 2019-11-09 DIAGNOSIS — M810 Age-related osteoporosis without current pathological fracture: Secondary | ICD-10-CM

## 2019-11-09 DIAGNOSIS — R4181 Age-related cognitive decline: Secondary | ICD-10-CM

## 2019-11-09 NOTE — Telephone Encounter (Signed)
Patient last had bone density 09/19/16 and due to repeat in 2 years. She has an upcoming appt on 11/25/19 for 6 month f/u  Please advise, thanks.

## 2019-11-09 NOTE — Telephone Encounter (Signed)
OK, pls order. Dx is "osteoporosis, senile" Thx

## 2019-11-09 NOTE — Telephone Encounter (Signed)
Patient is requesting an order to be placed for her bone density test at [facility].

## 2019-11-09 NOTE — Telephone Encounter (Signed)
Order entered for bone density. Patient notified she should receive a call with scheduling details.

## 2019-11-11 ENCOUNTER — Ambulatory Visit
Admission: RE | Admit: 2019-11-11 | Discharge: 2019-11-11 | Disposition: A | Discharge status: Home / Self Care | Payer: Medicare Other | Source: Ambulatory Visit | Attending: General Surgery | Admitting: General Surgery

## 2019-11-11 ENCOUNTER — Other Ambulatory Visit: Payer: Self-pay

## 2019-11-11 ENCOUNTER — Ambulatory Visit
Admission: RE | Admit: 2019-11-11 | Discharge: 2019-11-11 | Disposition: A | Discharge status: Home / Self Care | Payer: Medicare Other | Source: Ambulatory Visit | Attending: Family Medicine | Admitting: Family Medicine

## 2019-11-11 DIAGNOSIS — Z1231 Encounter for screening mammogram for malignant neoplasm of breast: Secondary | ICD-10-CM

## 2019-11-11 DIAGNOSIS — R4181 Age-related cognitive decline: Secondary | ICD-10-CM

## 2019-11-11 DIAGNOSIS — M810 Age-related osteoporosis without current pathological fracture: Secondary | ICD-10-CM

## 2019-11-11 DIAGNOSIS — Z780 Asymptomatic menopausal state: Secondary | ICD-10-CM | POA: Diagnosis not present

## 2019-11-11 NOTE — Progress Notes (Signed)
Assessment/Plan:   1.  Parkinsons Disease  -Continue carbidopa/levodopa 25/100, 1 tablet 3 times per day  -discussed safe, CV exercise  -We discussed that it used to be thought that levodopa would increase risk of melanoma but now it is believed that Parkinsons itself likely increases risk of melanoma. she is to get regular skin checks.  She does think that she has an upcoming appt next week  2.  F/u 5-6 months   Subjective:   Cassie White was seen today in follow up for Parkinsons disease, newly diagnosed last visit.  My previous records were reviewed prior to todays visit as well as outside records available to me.   Just started carbidopa/levodopa 25/100.  No SE with the medication.  Pt denies falls.  Pt denies lightheadedness, near syncope.  No hallucinations.  Mood has been good.  Patient saw her primary care physician on November 12.  Those notes are reviewed.  Current prescribed movement disorder medications: Carbidopa/levodopa 25/100, 1 tablet 3 times per day (started last visit)   PREVIOUS MEDICATIONS: Sinemet  ALLERGIES:   Allergies  Allergen Reactions  . Sulfa Antibiotics Swelling    face  . Tizanidine Other (See Comments)    delirium    CURRENT MEDICATIONS:  Outpatient Encounter Medications as of 11/15/2019  Medication Sig  . apixaban (ELIQUIS) 5 MG TABS tablet Eliquis 5 mg tablet  TAKE 1 TABLET BY MOUTH TWICE A DAY  . Biotin 5000 MCG CAPS Take 5,000 mcg by mouth daily.  . Calcium Carbonate (CALCIUM 600 PO) Take 600 mg by mouth 2 (two) times daily.   . carbidopa-levodopa (SINEMET IR) 25-100 MG tablet TAKE 1 TABLET BY MOUTH THREE TIMES A DAY  . cyanocobalamin (,VITAMIN B-12,) 1000 MCG/ML injection INJECT 1 ML (1,000 MCG TOTAL) INTO THE MUSCLE EVERY 30 (THIRTY) DAYS.  Marland Kitchen ELIQUIS 5 MG TABS tablet TAKE 1 TABLET BY MOUTH TWICE A DAY  . Fexofenadine HCl (MUCINEX ALLERGY PO) Take by mouth.  . flecainide (TAMBOCOR) 100 MG tablet Take 1 tablet (100 mg total) by mouth  2 (two) times daily.  Marland Kitchen gabapentin (NEURONTIN) 300 MG capsule TAKE 1 CAPSULE BY MOUTH AT LUNCH, 1 AT DINNER, AND 2 AT BEDTIME  . HYDROcodone-acetaminophen (NORCO/VICODIN) 5-325 MG tablet Take 1 tablet by mouth every 8 (eight) hours.  . metoprolol tartrate (LOPRESSOR) 25 MG tablet TAKE 1 TABLET BY MOUTH TWICE A DAY  . nitroGLYCERIN (NITROSTAT) 0.4 MG SL tablet Place 1 tablet (0.4 mg total) under the tongue every 5 (five) minutes as needed for chest pain.  . Omega-3 Fatty Acids (FISH OIL) 1200 MG CAPS Take 1,000 mg by mouth 2 (two) times daily.   . pravastatin (PRAVACHOL) 40 MG tablet TAKE 1 TABLET BY MOUTH EVERY DAY  . primidone (MYSOLINE) 50 MG tablet primidone 50 mg tablet  TAKE 1 TABLET BY MOUTH TWICE A DAY  . [DISCONTINUED] pravastatin (PRAVACHOL) 40 MG tablet Take 1 tablet (40 mg total) by mouth daily.   No facility-administered encounter medications on file as of 11/15/2019.    Objective:   PHYSICAL EXAMINATION:    VITALS:   Vitals:   11/15/19 1251  BP: 94/61  Pulse: 61  Resp: 18  SpO2: 95%  Weight: 150 lb (68 kg)  Height: 5\' 1"  (1.549 m)    GEN:  The patient appears stated age and is in NAD. HEENT:  Normocephalic, atraumatic.  The mucous membranes are moist. The superficial temporal arteries are without ropiness or tenderness. CV:  RRR Lungs:  CTAB Neck/HEME:  There are no carotid bruits bilaterally.  Neurological examination:  Orientation: The patient is alert and oriented x3. Cranial nerves: There is good facial symmetry with min facial hypomimia. The speech is fluent and clear. Soft palate rises symmetrically and there is no tongue deviation. Hearing is intact to conversational tone. Sensation: Sensation is intact to light touch throughout Motor: Strength is at least antigravity x4.  Movement examination: Tone: There is normal tone in the UE/LE Abnormal movements: none at rest.  Mild tremor on the R with holding a paper Coordination:  There is no decremation with  RAM's, with any form of RAMS, including alternating supination and pronation of the forearm, hand opening and closing, finger taps, heel taps and toe taps. Gait and Station: The patient pushes off to arise.  Flexes at the waist with the walker but walks with good stride length    Cc:  [name], Adrian Blackwater, MD

## 2019-11-14 ENCOUNTER — Other Ambulatory Visit: Payer: Self-pay | Admitting: Cardiology

## 2019-11-14 ENCOUNTER — Other Ambulatory Visit: Payer: Self-pay | Admitting: General Surgery

## 2019-11-14 DIAGNOSIS — N8111 Cystocele, midline: Secondary | ICD-10-CM | POA: Diagnosis not present

## 2019-11-14 DIAGNOSIS — Z01419 Encounter for gynecological examination (general) (routine) without abnormal findings: Secondary | ICD-10-CM | POA: Diagnosis not present

## 2019-11-14 DIAGNOSIS — R32 Unspecified urinary incontinence: Secondary | ICD-10-CM | POA: Diagnosis not present

## 2019-11-14 DIAGNOSIS — R928 Other abnormal and inconclusive findings on diagnostic imaging of breast: Secondary | ICD-10-CM

## 2019-11-14 DIAGNOSIS — Z6827 Body mass index (BMI) 27.0-27.9, adult: Secondary | ICD-10-CM | POA: Diagnosis not present

## 2019-11-15 ENCOUNTER — Other Ambulatory Visit: Payer: Self-pay

## 2019-11-15 ENCOUNTER — Ambulatory Visit (INDEPENDENT_AMBULATORY_CARE_PROVIDER_SITE_OTHER): Payer: Medicare Other | Admitting: Neurology

## 2019-11-15 ENCOUNTER — Encounter: Payer: Self-pay | Admitting: Neurology

## 2019-11-15 VITALS — BP 94/61 | HR 61 | Resp 18 | Ht 61.0 in | Wt 150.0 lb

## 2019-11-15 DIAGNOSIS — G20 Parkinson's disease: Secondary | ICD-10-CM | POA: Diagnosis not present

## 2019-11-15 NOTE — Patient Instructions (Signed)
No changes in medication today  Exercise safely!  Get to the dermatologist for a yearly skin check!  The physicians and staff at [facility] are committed to providing excellent care. You may receive a survey requesting feedback about your experience at our office. We strive to receive "very good" responses to the survey questions. If you feel that your experience would prevent you from giving the office a "very good " response, please contact our office to try to remedy the situation. We may be reached at [phone]. Thank you for taking the time out of your busy day to complete the survey.

## 2019-11-16 ENCOUNTER — Other Ambulatory Visit: Payer: Self-pay

## 2019-11-16 MED ORDER — ALENDRONATE SODIUM 70 MG PO TABS: 70.0000 mg | ORAL_TABLET | ORAL | 3 refills | Status: DC

## 2019-11-18 ENCOUNTER — Ambulatory Visit: Payer: Medicare Other | Admitting: Family Medicine

## 2019-11-22 DIAGNOSIS — H57812 Brow ptosis, left: Secondary | ICD-10-CM | POA: Diagnosis not present

## 2019-11-25 ENCOUNTER — Ambulatory Visit: Payer: Medicare Other | Admitting: Family Medicine

## 2019-11-29 ENCOUNTER — Other Ambulatory Visit: Payer: Self-pay

## 2019-11-29 ENCOUNTER — Ambulatory Visit
Admission: RE | Admit: 2019-11-29 | Discharge: 2019-11-29 | Disposition: A | Discharge status: Home / Self Care | Payer: Medicare Other | Source: Ambulatory Visit | Attending: General Surgery | Admitting: General Surgery

## 2019-11-29 ENCOUNTER — Other Ambulatory Visit: Payer: Self-pay | Admitting: General Surgery

## 2019-11-29 DIAGNOSIS — N641 Fat necrosis of breast: Secondary | ICD-10-CM

## 2019-11-29 DIAGNOSIS — R928 Other abnormal and inconclusive findings on diagnostic imaging of breast: Secondary | ICD-10-CM

## 2019-11-29 DIAGNOSIS — N6489 Other specified disorders of breast: Secondary | ICD-10-CM | POA: Diagnosis not present

## 2019-12-01 ENCOUNTER — Ambulatory Visit (INDEPENDENT_AMBULATORY_CARE_PROVIDER_SITE_OTHER): Payer: Medicare Other | Admitting: Family Medicine

## 2019-12-01 ENCOUNTER — Other Ambulatory Visit: Payer: Self-pay

## 2019-12-01 ENCOUNTER — Encounter: Payer: Self-pay | Admitting: Family Medicine

## 2019-12-01 VITALS — BP 108/70 | HR 51 | Temp 97.8°F | Resp 16 | Ht 61.0 in | Wt 153.8 lb

## 2019-12-01 DIAGNOSIS — Z7901 Long term (current) use of anticoagulants: Secondary | ICD-10-CM | POA: Diagnosis not present

## 2019-12-01 DIAGNOSIS — I480 Paroxysmal atrial fibrillation: Secondary | ICD-10-CM

## 2019-12-01 DIAGNOSIS — I10 Essential (primary) hypertension: Secondary | ICD-10-CM

## 2019-12-01 DIAGNOSIS — M810 Age-related osteoporosis without current pathological fracture: Secondary | ICD-10-CM | POA: Diagnosis not present

## 2019-12-01 DIAGNOSIS — E7800 Pure hypercholesterolemia, unspecified: Secondary | ICD-10-CM | POA: Diagnosis not present

## 2019-12-01 LAB — COMPREHENSIVE METABOLIC PANEL
ALT: 7 U/L (ref 0–35)
AST: 15 U/L (ref 0–37)
Albumin: 4.2 g/dL (ref 3.5–5.2)
Alkaline Phosphatase: 77 U/L (ref 39–117)
BUN: 20 mg/dL (ref 6–23)
CO2: 30 mEq/L (ref 19–32)
Calcium: 9.7 mg/dL (ref 8.4–10.5)
Chloride: 105 mEq/L (ref 96–112)
Creatinine, Ser: 0.92 mg/dL (ref 0.40–1.20)
GFR: 58.94 mL/min — ABNORMAL LOW (ref >60.00)
Glucose, Bld: 90 mg/dL (ref 70–99)
Potassium: 4.8 mEq/L (ref 3.5–5.1)
Sodium: 139 mEq/L (ref 135–145)
Total Bilirubin: 0.6 mg/dL (ref 0.2–1.2)
Total Protein: 6.4 g/dL (ref 6.0–8.3)

## 2019-12-01 LAB — LIPID PANEL
Cholesterol: 155 mg/dL (ref 0–200)
HDL: 66.2 mg/dL (ref >39.00)
LDL Cholesterol: 65 mg/dL (ref 0–99)
NonHDL: 89.06
Total CHOL/HDL Ratio: 2
Triglycerides: 118 mg/dL (ref 0.0–149.0)
VLDL: 23.6 mg/dL (ref 0.0–40.0)

## 2019-12-01 NOTE — Progress Notes (Signed)
OFFICE VISIT  12/01/2019   CC:  Chief Complaint  Patient presents with  . Follow-up    RCI, pt is not fasting    HPI:    Patient is a 79 y.o. Caucasian female who presents for 6 mo f/u HTN, HLD, and osteoporosis. She has PAF and is on DOAC and metoprolol.  Also has parkinson's dz, followed by Dr. Carles Collet. She last saw Dr. Carles Collet 2 wks ago and was continued on sinemet and told to f/u 5-6 months.  A/P as of last visit: "1) tremor->parkinsons's dz, no change on current sinemet dosing. She is feeling fine at this time, though, so I encouraged her to continue current dose and keep f/u with Dr. Carles Collet planned for 09/2019. Otherwise doesn't seem to display other sx's of parkinson's at this time.  2) Chronic debilitation/gait instability (mild): improving ever since getting into PT, sx's mild at this time. Continue PT.  3) Idiopathic PN: doing fine on gabapentin 600 mg bid.  RF'd gabapentin today.  4) Systolic heart murmur: 123456 echo documented mild MR with MVP.  Normal systolic function, grd 1 DD. No new testing indicated at this time.  5) Persistent A-fib: stable on flecainide (regular rhythm today) and eliquis. Plan CBC/lytes in 6 mo."  Interim hx: Just recently started fosamax for osteoporosis. She is hesitant about this med b/c of the risk of osteonecrosis of jaw. Says her teeth are "not that good" but has never had any extensive dental surgery and none planned.  HTN: no home bp monitoring.  Compliant with metoprolol.  HLD: tolerating statin daily.  She had a ham biscuit 4 hours ago, only water since then.  A-fib: denies any feeling of palpitations or racing heart.   No signs of bleeding.  Parkinsons: she cannot tell any difference in her tremors in hands/arms since getting sinemet. No new neurologic symptoms.  ROS: no fevers, no CP, no SOB, no wheezing, no cough, no dizziness, no HAs, no rashes, no melena/hematochezia.  No polyuria or polydipsia.  No myalgias or arthralgias.   No focal weakness or paresthesias.  Mild slow gait but no signif instability, no falls.  No acute vision or hearing abnormalities. No n/v/d or abd pain.  No palpitations.     Past Medical History:  Diagnosis Date  . Abnormal mammogram of left breast    Likely benign microcalcifications--repeat L diag mammo 03/24/2017.  COMPLEX SCLEROSING LESION WITH CALCIFICATIONS --no sign of malignancy.  Excision recommended--pt has been referred to Dr. Donne Hazel, who recommended f/u mammo and this was normal 12/2017--consider rpt 1 yr per rad.  . Arthritis    DJD, back and both hips  . Chronic low back pain without sciatica    Lumbar spondylosis + scoliosis.  Summer 2017 Dr. Nelva Bush did ESI and pt got no relief.  Pt then saw Dr. Maia Petties [facility]: felt she had facet mediated pain; plan for B L345 MBB (??).  Marland Kitchen Diverticulosis    on colonoscopy 2004  . GERD (gastroesophageal reflux disease)   . Hx of cardiovascular stress test    Lexiscan Myoview (2/14):  Low risk, small ant defect likely breast attenuation, no ischemia, EF 69% (no change from 2010).    . Hyperlipidemia   . Hypertension    per pt  . Idiopathic scoliosis of thoracolumbar region   . Lumbar back pain   . Mitral regurgitation    Echo (2/14):  EF 60-65%, Gr 1 DD, mild AI, mild bileaflet MVP, mod post directed MR, mild LAE,  PASP 35.  Marland Kitchen MVP (mitral valve prolapse)   . 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  Responding to gabapentin (Dr. Brett Fairy).  . Osteoporosis, senile    stable left hip 2015 (took fosamax x 76yrs, T-score -2.7 when she stopped bisphosphonate).  Rpt DEXA 51mo later (09/2016) T-score -.3.1 (different machine, though). 11/2019 DEXA T-score -4. Recommended restart of fosamax  . PAF (paroxysmal atrial fibrillation) (HCC)    Dr. Martinique: Flecainide, metopr, apxiaban.  08/2017-->persistent a-fib w/RVR, flecainide increased and she converted back to NSR and felt better.   100mg  bid flecainide continued.  . Parkinson's disease ([place]) 2020 dx   started sinemet 05/2019 (Dr. Carles Collet)  . Peripheral neuropathy 2008   Idiopathic vs familial (motor>sensory) vs lumbar disc dz: gabapentin helpful  . Shoulder pain left   RC surg 09/2014    Past Surgical History:  Procedure Laterality Date  . BREAST BIOPSY  04/10/2017   Left breast core needle biopsy of calcifications.  COMPLEX SCLEROSING LESION WITH CALCIFICATIONS --no sign of malignancy.  Excision recommended--pt has been referred to Dr. Donne Hazel, who recommended rpt mammo and this was NORMAL 12/2017.  Marland Kitchen BREAST EXCISIONAL BIOPSY Right 2010   Benign  . BREAST SURGERY  Nov 2010   Benign biopsy, right  . CHOLECYSTECTOMY  03/2010   Dr.Gross  . COLONOSCOPY  10/03/2002   No polyps.  Repeat 10 yrs recommended but pt declines.  Pt declined cologuard 08/2016 but changed her mind and this test was NEG on 03/23/17.  . COMBINED HYSTERECTOMY VAGINAL / OOPHORECTOMY / A&P REPAIR  0000000   uncertain if ovaries were removed or not  . CYSTOCELE REPAIR    . DEXA  11/2019   T score -4  . Lumpectomy  06/2009   "Fatty Necrosis"  . PFT's  2014   NORMAL  . REVERSE SHOULDER ARTHROPLASTY Right 03/11/2019   Procedure: REVERSE TOTAL SHOULDER ARTHROPLASTY;  Surgeon: Netta Cedars, MD;  Location: [facility];  Service: Orthopedics;  Laterality: Right;  Intersclene block  . ROTATOR CUFF REPAIR Left 10/02/14   Dr. Veverly Fells  . TONSILLECTOMY    . TOTAL KNEE ARTHROPLASTY  09/2009   Right ;Dr Alvan Dame  . TOTAL KNEE ARTHROPLASTY Left 03/10/2016   Procedure: TOTAL KNEE ARTHROPLASTY;  Surgeon: Paralee Cancel, MD;  Location: [facility];  Service: Orthopedics;  Laterality: Left;  . TRANSTHORACIC ECHOCARDIOGRAM  09/2012; 09/27/15   2014: EF 60-65%, Gr 1 DD, mild AI, mild bileaflet MVP, mod post directed MR, mild LAE, PASP 35.  Repeat 2017: EF 55-60%, mild AR, mild MVP and mild MV regurg.  Marland Kitchen VAGINAL HYSTERECTOMY     For Uterine Deviation     Outpatient Medications Prior to  Visit  Medication Sig Dispense Refill  . alendronate (FOSAMAX) 70 MG tablet Take 1 tablet (70 mg total) by mouth once a week. Take with a full glass of water on an empty stomach. 12 tablet 3  . Biotin 5000 MCG CAPS Take 5,000 mcg by mouth daily.    . Calcium Carbonate (CALCIUM 600 PO) Take 600 mg by mouth 2 (two) times daily.     . carbidopa-levodopa (SINEMET IR) 25-100 MG tablet TAKE 1 TABLET BY MOUTH THREE TIMES A DAY 270 tablet 1  . cyanocobalamin (,VITAMIN B-12,) 1000 MCG/ML injection INJECT 1 ML (1,000 MCG TOTAL) INTO THE MUSCLE EVERY 30 (THIRTY) DAYS. 10 mL 1  . ELIQUIS 5 MG TABS tablet TAKE 1 TABLET BY MOUTH TWICE A DAY 60 tablet 5  .  Fexofenadine HCl (MUCINEX ALLERGY PO) Take by mouth.    . flecainide (TAMBOCOR) 100 MG tablet Take 1 tablet (100 mg total) by mouth 2 (two) times daily. 180 tablet 3  . gabapentin (NEURONTIN) 300 MG capsule TAKE 1 CAPSULE BY MOUTH AT LUNCH, 1 AT DINNER, AND 2 AT BEDTIME 360 capsule 3  . metoprolol tartrate (LOPRESSOR) 25 MG tablet TAKE 1 TABLET BY MOUTH TWICE A DAY 180 tablet 1  . Omega-3 Fatty Acids (FISH OIL) 1200 MG CAPS Take 1,000 mg by mouth 2 (two) times daily.     . pravastatin (PRAVACHOL) 40 MG tablet TAKE 1 TABLET BY MOUTH EVERY DAY 90 tablet 3  . HYDROcodone-acetaminophen (NORCO/VICODIN) 5-325 MG tablet Take 1 tablet by mouth every 8 (eight) hours. (Patient not taking: Reported on 12/01/2019) 15 tablet 0  . nitroGLYCERIN (NITROSTAT) 0.4 MG SL tablet Place 1 tablet (0.4 mg total) under the tongue every 5 (five) minutes as needed for chest pain. (Patient not taking: Reported on 12/01/2019) 25 tablet 5  . apixaban (ELIQUIS) 5 MG TABS tablet Eliquis 5 mg tablet  TAKE 1 TABLET BY MOUTH TWICE A DAY     No facility-administered medications prior to visit.    Allergies  Allergen Reactions  . Sulfa Antibiotics Swelling    face  . Tizanidine Other (See Comments)    delirium    ROS As per HPI  PE: Blood pressure 108/70, pulse (!) 51, temperature  97.8 F (36.6 C), temperature source Temporal, resp. rate 16, height 5\' 1"  (1.549 m), weight 153 lb 12.8 oz (69.8 kg), SpO2 96 %. Gen: Alert, well appearing.  Patient is oriented to person, place, time, and situation. AFFECT: pleasant, lucid thought and speech. CV: RRR, trace systolic murmur, no r/g.   LUNGS: CTA bilat, nonlabored resps, good aeration in all lung fields. EXT: no clubbing or cyanosis.  no edema.    LABS:  Lab Results  Component Value Date   VITAMINB12 1,250 (H) 04/28/2014   Lab Results  Component Value Date   HGBA1C  09/19/2010    5.3 (NOTE)                                                                       According to the ADA Clinical Practice Recommendations for 2011, when HbA1c is used as a screening test:   >=6.5%   Diagnostic of Diabetes Mellitus           (if abnormal result  is confirmed)  5.7-6.4%   Increased risk of developing Diabetes Mellitus  References:Diagnosis and Classification of Diabetes Mellitus,Diabetes [id number] 1):S62-S69 and Standards of Medical Care in         Diabetes - 2011,Diabetes [id number]  (Suppl 1):S11-S61.     Chemistry      Component Value Date/Time   NA 139 03/12/2019 0253   NA 145 (H) 06/02/2018 1514   K 4.2 03/12/2019 0253   CL 108 03/12/2019 0253   CO2 25 03/12/2019 0253   BUN 10 03/12/2019 0253   BUN 18 06/02/2018 1514   CREATININE 0.90 03/12/2019 0253      Component Value Date/Time   CALCIUM 8.8 (L) 03/12/2019 0253   ALKPHOS 64 09/04/2017 1135   AST 16 09/04/2017 1135   ALT  14 09/04/2017 1135   BILITOT 0.8 09/04/2017 1135     Lab Results  Component Value Date   WBC 4.9 03/09/2019   HGB 12.3 03/12/2019   HCT 37.4 03/12/2019   MCV 97.0 03/09/2019   PLT 166 03/09/2019   Lab Results  Component Value Date   CHOL 186 03/26/2018   HDL 75.90 03/26/2018   LDLCALC 83 03/26/2018   LDLDIRECT 87.5 09/07/2012   TRIG 133.0 03/26/2018   CHOLHDL 2 03/26/2018   Lab Results  Component Value Date   TSH  1.32 04/21/2019    IMPRESSION AND PLAN:  1) Osteoporosis: T score recently was -4.0.  She definitely needs med tx in addition to Ca and vit D. She is ok with staying on the fosamax at this time despite her initial hesitancy about the potential bone effects on the jaw. She'll let me know if she changes he mind and we can change her to raloxifene.  2) HTN: The current medical regimen is effective;  continue present plan and medications. Lytes/cr today.  3) HLD: tolerating statin. FLP, CMET today.  4) PAF, on chronic anticoag: regular rhythm, no symptoms.  No sign of bleeding from DOAC. Keep routine f/u with cardiologist.  An After Visit Summary was printed and given to the patient.  FOLLOW UP: Return in about 6 months (around 06/01/2020) for routine chronic illness f/u.  Signed:  Crissie Sickles, MD           12/01/2019

## 2019-12-02 NOTE — Progress Notes (Deleted)
Cardiology Office Note    Date:  12/02/2019   ID:  Cassie White, DOB 05-25-1941, MRN [id number]  PCP:  Tammi Sou, MD  Cardiologist:  Dr. Martinique  No chief complaint on file.   History of Present Illness:  Cassie White is a 79 y.o. female with PMH of MVP, MR, HLD, GERD and PAF. She had a Myoview in 2010 with anterior breast attenuation, otherwise normal. Myoview obtained in February 2014 was low risk, small anterior defect likely breast attenuation, however no ischemia, EF 69%. Echocardiogram obtained in February 2014 showed EF 123456, grade 1 diastolic dysfunction, moderate posterior directed MR, PASP 35 mmHg. Last echocardiogram obtained on 09/27/2015 showed EF 55-60%, mild AR, mild MR, PA peak pressure 36 mmHg. Patient was noted to be in atrial fibrillation during the study. She was placed on eliquis and flecainide and subsequently went back to sinus rhythm. She underwent Myoview on 10/18/2015 to rule out pro arrhythmia and the ischemic heart disease, there was moderate sized and intensity partially reversible anterior and apical defect, EF was 64%, this is likely representing shifting breast artifact, overall it was considered intermediate risk study. The study was reviewed by Dr. Martinique, no further testing was felt to be needed, the image was consider low risk.  She was seen on 11/21/2016 for preoperative clearance, she was doing well at the time, she was cleared to proceed with back surgery.   She did go to the hospital on 02/14/2017 with a large bruise on the medial aspect of the left thigh. This occurred in the absence of trauma. She was on eliquis. This was managed conservatively.  She was seen in October 2019 with recurrent and persistent Afib with rate of 123. Her Flecainide dose was increased to 100 mg bid and she converted to NSR.   On follow up today she is doing very well from a cardiac standpoint. She denies any recurrent Afib since Flecainide dose increased. No chest  pain, dyspnea, edema. Seen recently by Dr. Anitra Lauth and noted tremor. Tried to taper off gabapentin but Neuropathy pain increased so she went back on it.    Past Medical History:  Diagnosis Date   Abnormal mammogram of left breast    Likely benign microcalcifications--repeat L diag mammo 03/24/2017.  COMPLEX SCLEROSING LESION WITH CALCIFICATIONS --no sign of malignancy.  Excision recommended--pt has been referred to Dr. Donne Hazel, who recommended f/u mammo and this was normal 12/2017--consider rpt 1 yr per rad.   Arthritis    DJD, back and both hips   Chronic low back pain without sciatica    Lumbar spondylosis + scoliosis.  Summer 2017 Dr. Nelva Bush did ESI and pt got no relief.  Pt then saw Dr. Maia Petties with [facility]: felt she had facet mediated pain; plan for B L345 MBB (??).   Diverticulosis    on colonoscopy 2004   GERD (gastroesophageal reflux disease)    Hx of cardiovascular stress test    Lexiscan Myoview (2/14):  Low risk, small ant defect likely breast attenuation, no ischemia, EF 69% (no change from 2010).     Hyperlipidemia    Hypertension    per pt   Idiopathic scoliosis of thoracolumbar region    Lumbar back pain    Mitral regurgitation    Echo (2/14):  EF 60-65%, Gr 1 DD, mild AI, mild bileaflet MVP, mod post directed MR, mild LAE, PASP 35.   MVP (mitral valve prolapse)    Neuropathy 2019   Lumbar spondylosis w/spinal  nerve impingment-related +/- nondiabetic PN.  Responding to gabapentin (Dr. Brett Fairy).   Osteoporosis, senile    stable left hip 2015 (took fosamax x 3yrs, T-score -2.7 when she stopped bisphosphonate).  Rpt DEXA 41mo later (09/2016) T-score -.3.1 (different machine, though). 11/2019 DEXA T-score -4. Recommended restart of fosamax   PAF (paroxysmal atrial fibrillation) (HCC)    Dr. Martinique: Flecainide, metopr, apxiaban.  08/2017-->persistent a-fib w/RVR, flecainide increased and she converted back to NSR and felt better.  100mg  bid flecainide  continued.   Parkinson's disease ([place]) 2020 dx   started sinemet 05/2019 (Dr. Carles Collet)   Peripheral neuropathy 2008   Idiopathic vs familial (motor>sensory) vs lumbar disc dz: gabapentin helpful   Shoulder pain left   RC surg 09/2014    Past Surgical History:  Procedure Laterality Date   BREAST BIOPSY  04/10/2017   Left breast core needle biopsy of calcifications.  COMPLEX SCLEROSING LESION WITH CALCIFICATIONS --no sign of malignancy.  Excision recommended--pt has been referred to Dr. Donne Hazel, who recommended rpt mammo and this was NORMAL 12/2017.   BREAST EXCISIONAL BIOPSY Right 2010   Benign   BREAST SURGERY  Nov 2010   Benign biopsy, right   CHOLECYSTECTOMY  03/2010   Dr.Gross   COLONOSCOPY  10/03/2002   No polyps.  Repeat 10 yrs recommended but pt declines.  Pt declined cologuard 08/2016 but changed her mind and this test was NEG on 03/23/17.   COMBINED HYSTERECTOMY VAGINAL / OOPHORECTOMY / A&P REPAIR  0000000   uncertain if ovaries were removed or not   CYSTOCELE REPAIR     DEXA  11/2019   T score -4   Lumpectomy  06/2009   "Fatty Necrosis"   PFT's  2014   NORMAL   REVERSE SHOULDER ARTHROPLASTY Right 03/11/2019   Procedure: REVERSE TOTAL SHOULDER ARTHROPLASTY;  Surgeon: Netta Cedars, MD;  Location: [facility];  Service: Orthopedics;  Laterality: Right;  Intersclene block   ROTATOR CUFF REPAIR Left 10/02/14   Dr. Veverly Fells   TONSILLECTOMY     TOTAL KNEE ARTHROPLASTY  09/2009   Right ;Dr Alvan Dame   TOTAL KNEE ARTHROPLASTY Left 03/10/2016   Procedure: TOTAL KNEE ARTHROPLASTY;  Surgeon: Paralee Cancel, MD;  Location: [facility];  Service: Orthopedics;  Laterality: Left;   TRANSTHORACIC ECHOCARDIOGRAM  09/2012; 09/27/15   2014: EF 60-65%, Gr 1 DD, mild AI, mild bileaflet MVP, mod post directed MR, mild LAE, PASP 35.  Repeat 2017: EF 55-60%, mild AR, mild MVP and mild MV regurg.   VAGINAL HYSTERECTOMY     For Uterine Deviation     Current Medications: Outpatient Medications Prior to  Visit  Medication Sig Dispense Refill   alendronate (FOSAMAX) 70 MG tablet Take 1 tablet (70 mg total) by mouth once a week. Take with a full glass of water on an empty stomach. 12 tablet 3   Biotin 5000 MCG CAPS Take 5,000 mcg by mouth daily.     Calcium Carbonate (CALCIUM 600 PO) Take 600 mg by mouth 2 (two) times daily.      carbidopa-levodopa (SINEMET IR) 25-100 MG tablet TAKE 1 TABLET BY MOUTH THREE TIMES A DAY 270 tablet 1   cyanocobalamin (,VITAMIN B-12,) 1000 MCG/ML injection INJECT 1 ML (1,000 MCG TOTAL) INTO THE MUSCLE EVERY 30 (THIRTY) DAYS. 10 mL 1   ELIQUIS 5 MG TABS tablet TAKE 1 TABLET BY MOUTH TWICE A DAY 60 tablet 5   Fexofenadine HCl (MUCINEX ALLERGY PO) Take by mouth.     flecainide (TAMBOCOR) 100 MG  tablet Take 1 tablet (100 mg total) by mouth 2 (two) times daily. 180 tablet 3   gabapentin (NEURONTIN) 300 MG capsule TAKE 1 CAPSULE BY MOUTH AT LUNCH, 1 AT DINNER, AND 2 AT BEDTIME 360 capsule 3   HYDROcodone-acetaminophen (NORCO/VICODIN) 5-325 MG tablet Take 1 tablet by mouth every 8 (eight) hours. (Patient not taking: Reported on 12/01/2019) 15 tablet 0   metoprolol tartrate (LOPRESSOR) 25 MG tablet TAKE 1 TABLET BY MOUTH TWICE A DAY 180 tablet 1   nitroGLYCERIN (NITROSTAT) 0.4 MG SL tablet Place 1 tablet (0.4 mg total) under the tongue every 5 (five) minutes as needed for chest pain. (Patient not taking: Reported on 12/01/2019) 25 tablet 5   Omega-3 Fatty Acids (FISH OIL) 1200 MG CAPS Take 1,000 mg by mouth 2 (two) times daily.      pravastatin (PRAVACHOL) 40 MG tablet TAKE 1 TABLET BY MOUTH EVERY DAY 90 tablet 3   No facility-administered medications prior to visit.     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   Orig from [place].   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   Caffiene, 1 cup daily avg.   No tob, no alc, no drugs.   Exercise: walking, limited by bilat hip pain.   Social Determinants of Health   Financial Resource Strain:    Difficulty of Paying Living Expenses:   Food Insecurity:    Worried About Charity fundraiser in the Last Year:    Arboriculturist in the Last Year:   Transportation Needs:    Film/video editor (Medical):    Lack of Transportation (Non-Medical):   Physical Activity:    Days of Exercise per Week:    Minutes of Exercise per Session:   Stress:    Feeling of Stress :   Social Connections:    Frequency of Communication with Friends and Family:    Frequency of Social Gatherings with Friends and Family:    Attends Religious Services:    Active Member of Clubs or Organizations:    Attends Music therapist:    Marital Status:      Family History:  The patient's family history includes COPD in her brother; Coronary artery disease in her brother; Healthy in her daughter and son; Heart attack in her father; Heart attack (age of onset: 32) in her son; Ovarian cancer in her mother; Stroke (age of onset: 77) in her father.   ROS:   Please see the history of present illness.    ROS All other systems reviewed and are negative.   PHYSICAL EXAM:   VS:  There were no vitals taken for this visit.   GENERAL:  Well appearing WF in NAD HEENT:  PERRL, EOMI, sclera are  clear. Oropharynx is clear. NECK:  No  jugular venous distention, carotid upstroke brisk and symmetric, no bruits, no thyromegaly or adenopathy LUNGS:  Clear to auscultation bilaterally CHEST:  Unremarkable HEART:  RRR,  PMI not displaced or sustained,S1 and S2 within normal limits, no S3, no S4: no clicks, no rubs, soft systolic murmur at the apex. ABD:  Soft, nontender. BS +, no masses or bruits. No hepatomegaly, no splenomegaly EXT:  2 + pulses throughout, no edema, no cyanosis no clubbing SKIN:  Warm and dry.  No rashes NEURO:  Alert and oriented x 3. Cranial nerves II through XII intact. PSYCH:  Cognitively intact      Wt Readings from Last 3 Encounters:  12/01/19 153 lb 12.8 oz (69.8 kg)  11/15/19 150 lb (68 kg)  06/16/19 152 lb 3.2 oz (69 kg)      Studies/Labs Reviewed:   EKG:  EKG is not ordered today.  Recent Labs: 03/09/2019: Platelets 166 03/12/2019: Hemoglobin 12.3 04/21/2019: TSH 1.32 12/01/2019: ALT 7; BUN 20; Creatinine, Ser 0.92; Potassium 4.8; Sodium 139   Lipid Panel    Component Value Date/Time   CHOL 155 12/01/2019 1155   CHOL 211 (H) 04/28/2014 1026   TRIG 118.0 12/01/2019 1155   TRIG 95 04/28/2014 1026   HDL 66.20 12/01/2019 1155   HDL 101 04/28/2014 1026   CHOLHDL 2 12/01/2019 1155   VLDL 23.6 12/01/2019 1155   LDLCALC 65 12/01/2019 1155   LDLCALC 91 04/28/2014 1026   LDLDIRECT 87.5 09/07/2012 1022    Additional studies/ records that were reviewed today include:   Echo 09/27/2015 LV EF: 55% -   60%  Study Conclusions  - Left ventricle: The cavity size was normal. There was mild   concentric hypertrophy. Systolic function was normal. The   estimated ejection fraction was in the range of 55% to 60%. - Aortic valve: There was mild regurgitation. - Mitral valve: Mild anterior leafelt prolapse. There was mild   regurgitation. - Atrial septum: No defect or patent foramen ovale was identified. - Pulmonary arteries: PA peak pressure: 36 mm Hg (S).   Myoview 10/18/2015 Study  Highlights    The left ventricular ejection fraction is moderately decreased (30-44%).  Nuclear stress EF: 64%.  There was no ST segment deviation noted during stress.  No T wave inversion was noted during stress.  Defect 1: There is a medium defect of moderate severity.  This is an intermediate risk study.  Findings consistent with prior myocardial infarction with peri-infarct ischemia.   Moderate size and intensity partially reversible anterior and apical wall defect (SDS 4). LVEF 64% with normal wall motion. This likely represents shifting breast artifact given normal wall motion and LV function. Clinical correlation is advised. This is an intermediate risk study.      ASSESSMENT:    No diagnosis found.   PLAN:  In order of problems listed above:  1. PAF: doing well on increased dose of flecainide. Continues on metoprolol and Eliquis.   No bleeding issues. Will continue current therapy.  2. Hyperlipidemia: Last LDL 83.   3. Moderate mitral regurgitation with MV prolapse:  She is asymptomatic. Will monitor.    Medication Adjustments/Labs and Tests Ordered: Current medicines are reviewed at length with the patient today.  Concerns regarding medicines are outlined above.  Medication changes, Labs and Tests ordered today are listed in the Patient Instructions below. There are no Patient Instructions on file for this visit.   Signed, Mahogony Gilchrest Martinique, MD  12/02/2019  7:36 AM    [facility] [place], [place], [place]  [address] Phone: [phone]; Fax: [phone]

## 2019-12-06 ENCOUNTER — Ambulatory Visit: Payer: Medicare Other | Admitting: Cardiology

## 2019-12-08 DIAGNOSIS — B351 Tinea unguium: Secondary | ICD-10-CM | POA: Diagnosis not present

## 2019-12-08 DIAGNOSIS — L84 Corns and callosities: Secondary | ICD-10-CM | POA: Diagnosis not present

## 2019-12-08 DIAGNOSIS — M79676 Pain in unspecified toe(s): Secondary | ICD-10-CM | POA: Diagnosis not present

## 2019-12-08 DIAGNOSIS — G609 Hereditary and idiopathic neuropathy, unspecified: Secondary | ICD-10-CM | POA: Diagnosis not present

## 2019-12-09 DIAGNOSIS — L821 Other seborrheic keratosis: Secondary | ICD-10-CM | POA: Diagnosis not present

## 2019-12-09 DIAGNOSIS — L570 Actinic keratosis: Secondary | ICD-10-CM | POA: Diagnosis not present

## 2019-12-10 ENCOUNTER — Encounter: Payer: Self-pay | Admitting: Family Medicine

## 2019-12-14 ENCOUNTER — Other Ambulatory Visit: Payer: Self-pay | Admitting: Family Medicine

## 2019-12-21 ENCOUNTER — Telehealth: Payer: Self-pay

## 2019-12-21 NOTE — Telephone Encounter (Signed)
   [facility] Pre-operative Risk Assessment    HEARTCARE STAFF: - Please ensure there is not already an duplicate clearance open for this procedure - Under Visit Info/Reason for Call, type in Other and utilize the format Clearance MM/DD/YY or Clearance TBD  Request for surgical clearance:  1. What type of surgery is being performed? Left brow lift  2. When is this surgery scheduled? TBD  3. What type of clearance is required (medical clearance vs. Pharmacy clearance to hold med vs. Both)? Both  4. Are there any medications that need to be held prior to surgery and how long? Eliquis  5. Practice name and name of physician performing surgery? [facility]  [place]  6. What is the office phone number? [phone]   7.   What is the office fax number? [phone]  8.   Anesthesia type not listed   Kathyrn Lass 12/21/2019, 6:00 PM  _________________________________________________________________   (provider comments below)

## 2019-12-22 NOTE — Telephone Encounter (Signed)
Left message for the pt to please call Dr. Doug Sou office to set up an appt for pre op clearance.

## 2019-12-22 NOTE — Telephone Encounter (Signed)
   Primary Cardiologist:Peter Martinique, MD  Chart reviewed as part of pre-operative protocol coverage. Because of Cassie White's past medical history and time since last visit, he/she will require a follow-up visit in order to better assess preoperative cardiovascular risk.  Pre-op covering staff: - Please schedule appointment and call patient to inform them. - Please contact requesting surgeon's office via preferred method (i.e, phone, fax) to inform them of need for appointment prior to surgery.  If applicable, this message will also be routed to pharmacy pool and/or primary cardiologist for input on holding anticoagulant/antiplatelet agent as requested below so that this information is available at time of patient's appointment.   Kathyrn Drown, NP  12/22/2019, 8:23 AM

## 2019-12-23 NOTE — Telephone Encounter (Signed)
I s/w the pt to schedule an appt for pre op clearance. Pt states her daughter usually makes the appts. She will have her daughter call to schedule an appt with Dr. Martinique or APP.

## 2019-12-26 NOTE — Telephone Encounter (Signed)
Pre op clearance appt set with Roby Lofts, [facility] 01/06/20. I will forward clearance notes to [facility] for upcoming appt. I will send FYI to surgeon pt has appt with cardiologist 01/06/20. I will remove from the pre op call back pool.

## 2020-01-05 NOTE — Progress Notes (Signed)
Cardiology Office Note   Date:  01/06/2020   ID:  Cassie White, DOB 1941/04/07, MRN [id number]  PCP:  Tammi Sou, MD  Cardiologist:  Peter Martinique, MD EP: None  Chief Complaint  Patient presents with  . Pre-op Exam      History of Present Illness: Cassie White is a 79 y.o. female with PMH of paroxysmal atrial fibrillation, MVP with mitral regurgitation, HLD, and GERD, who presents for preoperative assessment.  She was last evaluated by cardiology at an outpatient visit with Dr. Martinique 09/2018, at which time she was doing well from a cardiac standpoint without recurrent atrial fibrillation following an increase in her flecainide. No medication changes occurred and she was recommended to follow-up in 6 months. Her last ischemic evaluation was a NST in 2017 to rule out pro arrhythmia and the ischemic heart disease, there was moderate sized and intensity partially reversible anterior and apical defect, EF was 64%, likely representing shifting breast artifact, overall it was considered intermediate risk study. The study was reviewed by Dr. Martinique, no further testing was felt to be needed, the image was consider low risk. Her last echocardiogram in 2017 showed EF 55-60%, mild AI, mild MR, with peak PA pressures 36 mmHg.  She has been doing well from a cardiac standpoint since her last visit. She is limited in activity by back pain. Ambulates with a walker. She can dust her floors, do laundry and dishes, change the sheets on her bed, and walk short distances without chest pain or SOB. No complaints of palpitations or symptoms c/w recurrent atrial fibrillation. No complaints of dizziness, lightheadedness, syncope, LE edema, orthopnea, or PND.     Past Medical History:  Diagnosis Date  . Abnormal mammogram of left breast    Likely benign microcalcifications--repeat L diag mammo 03/24/2017.  COMPLEX SCLEROSING LESION WITH CALCIFICATIONS --no sign of malignancy.  Excision recommended--pt  has been referred to Dr. Donne Hazel, who recommended f/u mammo and this was normal 12/2017--consider rpt 1 yr per rad.  . Arthritis    DJD, back and both hips  . Chronic low back pain without sciatica    Lumbar spondylosis + scoliosis.  Summer 2017 Dr. Nelva Bush did ESI and pt got no relief.  Pt then saw Dr. Maia Petties [facility]: felt she had facet mediated pain; plan for B L345 MBB (??).  Marland Kitchen Diverticulosis    on colonoscopy 2004  . GERD (gastroesophageal reflux disease)   . Hx of cardiovascular stress test    Lexiscan Myoview (2/14):  Low risk, small ant defect likely breast attenuation, no ischemia, EF 69% (no change from 2010).    . Hyperlipidemia   . Hypertension    per pt  . Idiopathic scoliosis of thoracolumbar region   . Lumbar back pain   . Mitral regurgitation    Echo (2/14):  EF 60-65%, Gr 1 DD, mild AI, mild bileaflet MVP, mod post directed MR, mild LAE, PASP 35.  Marland Kitchen MVP (mitral valve prolapse)   . 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  Responding to gabapentin (Dr. Brett Fairy).  . Osteoporosis, senile    stable left hip 2015 (took fosamax x 23yrs, T-score -2.7 when she stopped bisphosphonate).  Rpt DEXA 19mo later (09/2016) T-score -.3.1 (different machine, though). 11/2019 DEXA T-score -4. Recommended restart of fosamax  . PAF (paroxysmal atrial fibrillation) (HCC)    Dr. Martinique: Flecainide, metopr, apxiaban.  08/2017-->persistent a-fib w/RVR, flecainide increased and she converted  back to NSR and felt better.  100mg  bid flecainide continued.  . Parkinson's disease ([place]) 2020 dx   started sinemet 05/2019 (Dr. Carles Collet)  . Peripheral neuropathy 2008   Idiopathic vs familial (motor>sensory) vs lumbar disc dz: gabapentin helpful  . Shoulder pain left   RC surg 09/2014    Past Surgical History:  Procedure Laterality Date  . BREAST BIOPSY  04/10/2017   Left breast core needle biopsy of calcifications.  COMPLEX SCLEROSING LESION  WITH CALCIFICATIONS --no sign of malignancy.  Excision recommended--pt has been referred to Dr. Donne Hazel, who recommended rpt mammo and this was NORMAL 12/2017.  Marland Kitchen BREAST EXCISIONAL BIOPSY Right 2010   Benign  . BREAST SURGERY  Nov 2010   Benign biopsy, right  . CHOLECYSTECTOMY  03/2010   Dr.Gross  . COLONOSCOPY  10/03/2002   No polyps.  Repeat 10 yrs recommended but pt declines.  Pt declined cologuard 08/2016 but changed her mind and this test was NEG on 03/23/17.  . COMBINED HYSTERECTOMY VAGINAL / OOPHORECTOMY / A&P REPAIR  0000000   uncertain if ovaries were removed or not  . CYSTOCELE REPAIR    . DEXA  11/2019   T score -4  . Lumpectomy  06/2009   "Fatty Necrosis"  . PFT's  2014   NORMAL  . REVERSE SHOULDER ARTHROPLASTY Right 03/11/2019   Procedure: REVERSE TOTAL SHOULDER ARTHROPLASTY;  Surgeon: Netta Cedars, MD;  Location: [facility];  Service: Orthopedics;  Laterality: Right;  Intersclene block  . ROTATOR CUFF REPAIR Left 10/02/14   Dr. Veverly Fells  . TONSILLECTOMY    . TOTAL KNEE ARTHROPLASTY  09/2009   Right ;Dr Alvan Dame  . TOTAL KNEE ARTHROPLASTY Left 03/10/2016   Procedure: TOTAL KNEE ARTHROPLASTY;  Surgeon: Paralee Cancel, MD;  Location: [facility];  Service: Orthopedics;  Laterality: Left;  . TRANSTHORACIC ECHOCARDIOGRAM  09/2012; 09/27/15   2014: EF 60-65%, Gr 1 DD, mild AI, mild bileaflet MVP, mod post directed MR, mild LAE, PASP 35.  Repeat 2017: EF 55-60%, mild AR, mild MVP and mild MV regurg.  Marland Kitchen VAGINAL HYSTERECTOMY     For Uterine Deviation      Current Outpatient Medications  Medication Sig Dispense Refill  . alendronate (FOSAMAX) 70 MG tablet Take 1 tablet (70 mg total) by mouth once a week. Take with a full glass of water on an empty stomach. 12 tablet 3  . Biotin 5000 MCG CAPS Take 5,000 mcg by mouth daily.    . Calcium Carbonate (CALCIUM 600 PO) Take 600 mg by mouth 2 (two) times daily.     . carbidopa-levodopa (SINEMET IR) 25-100 MG tablet TAKE 1 TABLET BY MOUTH THREE TIMES A DAY 270  tablet 1  . cyanocobalamin (,VITAMIN B-12,) 1000 MCG/ML injection INJECT 1 ML (1,000 MCG TOTAL) INTO THE MUSCLE EVERY 30 (THIRTY) DAYS. 10 mL 1  . ELIQUIS 5 MG TABS tablet TAKE 1 TABLET BY MOUTH TWICE A DAY 60 tablet 5  . Fexofenadine HCl (MUCINEX ALLERGY PO) Take by mouth.    . flecainide (TAMBOCOR) 100 MG tablet Take 1 tablet (100 mg total) by mouth 2 (two) times daily. 180 tablet 3  . gabapentin (NEURONTIN) 300 MG capsule TAKE 1 CAPSULE BY MOUTH AT LUNCH, 1 AT DINNER, AND 2 AT BEDTIME 360 capsule 3  . HYDROcodone-acetaminophen (NORCO/VICODIN) 5-325 MG tablet Take 1 tablet by mouth every 8 (eight) hours. 15 tablet 0  . metoprolol tartrate (LOPRESSOR) 25 MG tablet TAKE 1 TABLET BY MOUTH TWICE A DAY 180 tablet 1  .  nitroGLYCERIN (NITROSTAT) 0.4 MG SL tablet Place 1 tablet (0.4 mg total) under the tongue every 5 (five) minutes as needed for chest pain. 25 tablet 5  . Omega-3 Fatty Acids (FISH OIL) 1200 MG CAPS Take 1,000 mg by mouth 2 (two) times daily.     . pravastatin (PRAVACHOL) 40 MG tablet TAKE 1 TABLET BY MOUTH EVERY DAY 90 tablet 3   No current facility-administered medications for this visit.    Allergies:   Sulfa antibiotics and Tizanidine    Social History:  The patient  reports that she has never smoked. She has never used smokeless tobacco. She reports that she does not drink alcohol or use drugs.   Family History:  The patient's family history includes COPD in her brother; Coronary artery disease in her brother; Healthy in her daughter and son; Heart attack in her father; Heart attack (age of onset: 37) in her son; Ovarian cancer in her mother; Stroke (age of onset: 81) in her father.    ROS:  Please see the history of present illness.   Otherwise, review of systems are positive for none.   All other systems are reviewed and negative.    PHYSICAL EXAM: VS:  BP 122/62   Pulse (!) 52   Ht 5\' 1"  (1.549 m)   Wt 153 lb 9.6 oz (69.7 kg)   SpO2 93%   BMI 29.02 kg/m  , BMI Body  mass index is 29.02 kg/m. GEN: Well nourished, well developed, in no acute distress HEENT: sclera anicteric, left brow ptosis Neck: no JVD, carotid bruits, or masses Cardiac: bradycardic, regular rhythm, no murmurs, rubs, or gallops, no edema  Respiratory:  clear to auscultation bilaterally, normal work of breathing GI: soft, nontender, nondistended, + BS MS: no deformity or atrophy Skin: warm and dry, no rash Neuro:  Strength and sensation are intact Psych: euthymic mood, full affect   EKG:  EKG is ordered today. The ekg ordered today demonstrates sinus bradycardia with 1st degree AV block, rate 52 bpm, incomplete LBBB, no STE/D, no TWI; no significant change from previous   Recent Labs: 03/09/2019: Platelets 166 03/12/2019: Hemoglobin 12.3 04/21/2019: TSH 1.32 12/01/2019: ALT 7; BUN 20; Creatinine, Ser 0.92; Potassium 4.8; Sodium 139    Lipid Panel    Component Value Date/Time   CHOL 155 12/01/2019 1155   CHOL 211 (H) 04/28/2014 1026   TRIG 118.0 12/01/2019 1155   TRIG 95 04/28/2014 1026   HDL 66.20 12/01/2019 1155   HDL 101 04/28/2014 1026   CHOLHDL 2 12/01/2019 1155   VLDL 23.6 12/01/2019 1155   LDLCALC 65 12/01/2019 1155   LDLCALC 91 04/28/2014 1026   LDLDIRECT 87.5 09/07/2012 1022      Wt Readings from Last 3 Encounters:  01/06/20 153 lb 9.6 oz (69.7 kg)  12/01/19 153 lb 12.8 oz (69.8 kg)  11/15/19 150 lb (68 kg)      Other studies Reviewed: Additional studies/ records that were reviewed today include:    Echo 09/27/2015 LV EF: 55% - 60%  Study Conclusions  - Left ventricle: The cavity size was normal. There was mild concentric hypertrophy. Systolic function was normal. The estimated ejection fraction was in the range of 55% to 60%. - Aortic valve: There was mild regurgitation. - Mitral valve: Mild anterior leafelt prolapse. There was mild regurgitation. - Atrial septum: No defect or patent foramen ovale was identified. - Pulmonary arteries:  PA peak pressure: 36 mm Hg (S).   Myoview 10/18/2015 Study Highlights  The left ventricular ejection fraction is moderately decreased (30-44%).  Nuclear stress EF: 64%.  There was no ST segment deviation noted during stress.  No T wave inversion was noted during stress.  Defect 1: There is a medium defect of moderate severity.  This is an intermediate risk study.  Findings consistent with prior myocardial infarction with peri-infarct ischemia.  Moderate size and intensity partially reversible anterior and apical wall defect (SDS 4). LVEF 64% with normal wall motion. This likely represents shifting breast artifact given normal wall motion and LV function. Clinical correlation is advised. This is an intermediate risk study.        ASSESSMENT AND PLAN:  1. Preoperative assessment: planning for a brow lift. Low risk procedure. She is limited in activity by back pain, though can dust her floors, do laundry, change the sheets on her bed, and walk short distances without chest pain or SOB - Based on ACC/AHA guidelines, Dnasia A Lopezperez would be at acceptable risk for the planned procedure without further cardiovascular testing.  - I will route this recommendation to the requesting party via [place] fax function  - Per pharmacy recommendations, patient can hold eliquis 2 days prior to her upcoming procedure.  2. Paroxysmal atrial fibrillation: no recurrence. No complaints of bleeding on eliquis - Continue flecainide for rhythm control - Continue metoprolol for rate control - Continue eliquis for stroke ppx  3. HLD: LDL 65 11/2019 - Continue pravastatin and omega 3  4. MVP with mitral regurgitation: moderate MR on echo, though has not had one in several years. No symptoms to suggest progression of disease - Continue to monitor for now   Current medicines are reviewed at length with the patient today.  The patient does not have concerns regarding medicines.  The following  changes have been made:  As above  Labs/ tests ordered today include:   Orders Placed This Encounter  Procedures  . EKG 12-Lead     Disposition:   FU with Dr. Martinique in 6 months  Signed, Abigail Butts, PA-C  01/06/2020 10:17 AM

## 2020-01-06 ENCOUNTER — Other Ambulatory Visit: Payer: Self-pay

## 2020-01-06 ENCOUNTER — Encounter: Payer: Self-pay | Admitting: Medical

## 2020-01-06 ENCOUNTER — Ambulatory Visit (INDEPENDENT_AMBULATORY_CARE_PROVIDER_SITE_OTHER): Payer: Medicare Other | Admitting: Medical

## 2020-01-06 VITALS — BP 122/62 | HR 52 | Ht 61.0 in | Wt 153.6 lb

## 2020-01-06 DIAGNOSIS — Z01818 Encounter for other preprocedural examination: Secondary | ICD-10-CM

## 2020-01-06 DIAGNOSIS — I480 Paroxysmal atrial fibrillation: Secondary | ICD-10-CM

## 2020-01-06 DIAGNOSIS — I341 Nonrheumatic mitral (valve) prolapse: Secondary | ICD-10-CM

## 2020-01-06 DIAGNOSIS — I340 Nonrheumatic mitral (valve) insufficiency: Secondary | ICD-10-CM

## 2020-01-06 DIAGNOSIS — E785 Hyperlipidemia, unspecified: Secondary | ICD-10-CM

## 2020-01-06 NOTE — Telephone Encounter (Signed)
Patient with diagnosis of atrial fibrillation on Eliquis for anticoagulation.    Procedure: left brow lift Date of procedure: TBD  CHADS2-VASc score of  3 (AGE x 2, female)  CrCl 55.5 Platelet count 166  Per office protocol, patient can hold Eliquis for 2 days prior to procedure.    Patient will not need bridging with Lovenox (enoxaparin) around procedure.

## 2020-01-06 NOTE — Telephone Encounter (Signed)
Patient is seeing Daleen Snook today in the clinic

## 2020-01-06 NOTE — Patient Instructions (Signed)
Medication Instructions:  Your physician recommends that you continue on your current medications as directed. Please refer to the Current Medication list given to you today.  *If you need a refill on your cardiac medications before your next appointment, please call your pharmacy*  Lab Work: NONE ordered at this time of appointment   If you have labs (blood work) drawn today and your tests are completely normal, you will receive your results only by: . MyChart Message (if you have MyChart) OR . A paper copy in the mail If you have any lab test that is abnormal or we need to change your treatment, we will call you to review the results.  Testing/Procedures: NONE ordered at this time of appointment   Follow-Up: At [facility], you and your health needs are our priority.  As part of our continuing mission to provide you with exceptional heart care, we have created designated Provider Care Teams.  These Care Teams include your primary Cardiologist (physician) and Advanced Practice Providers (APPs -  Physician Assistants and Nurse Practitioners) who all work together to provide you with the care you need, when you need it.  We recommend signing up for the patient portal called "MyChart".  Sign up information is provided on this After Visit Summary.  MyChart is used to connect with patients for Virtual Visits (Telemedicine).  Patients are able to view lab/test results, encounter notes, upcoming appointments, etc.  Non-urgent messages can be sent to your provider as well.   To learn more about what you can do with MyChart, go to [email or web address].    Your next appointment:   6 month(s)  The format for your next appointment:   In Person  Provider:   Peter Jordan, MD  Other Instructions   

## 2020-02-02 ENCOUNTER — Other Ambulatory Visit: Payer: Self-pay | Admitting: Family Medicine

## 2020-02-02 DIAGNOSIS — I4891 Unspecified atrial fibrillation: Secondary | ICD-10-CM

## 2020-02-09 DIAGNOSIS — Z79899 Other long term (current) drug therapy: Secondary | ICD-10-CM | POA: Diagnosis not present

## 2020-02-09 DIAGNOSIS — Z96612 Presence of left artificial shoulder joint: Secondary | ICD-10-CM | POA: Diagnosis not present

## 2020-02-09 DIAGNOSIS — Z9109 Other allergy status, other than to drugs and biological substances: Secondary | ICD-10-CM | POA: Diagnosis not present

## 2020-02-09 DIAGNOSIS — H57812 Brow ptosis, left: Secondary | ICD-10-CM | POA: Diagnosis not present

## 2020-02-09 DIAGNOSIS — Z96653 Presence of artificial knee joint, bilateral: Secondary | ICD-10-CM | POA: Diagnosis not present

## 2020-02-09 DIAGNOSIS — Z888 Allergy status to other drugs, medicaments and biological substances status: Secondary | ICD-10-CM | POA: Diagnosis not present

## 2020-02-09 DIAGNOSIS — Z882 Allergy status to sulfonamides status: Secondary | ICD-10-CM | POA: Diagnosis not present

## 2020-02-09 DIAGNOSIS — I10 Essential (primary) hypertension: Secondary | ICD-10-CM | POA: Diagnosis not present

## 2020-02-09 DIAGNOSIS — Z7901 Long term (current) use of anticoagulants: Secondary | ICD-10-CM | POA: Diagnosis not present

## 2020-02-09 DIAGNOSIS — I4891 Unspecified atrial fibrillation: Secondary | ICD-10-CM | POA: Diagnosis not present

## 2020-02-09 DIAGNOSIS — Z96611 Presence of right artificial shoulder joint: Secondary | ICD-10-CM | POA: Diagnosis not present

## 2020-02-26 ENCOUNTER — Other Ambulatory Visit: Payer: Self-pay | Admitting: Cardiology

## 2020-02-27 NOTE — Telephone Encounter (Signed)
Prescription refill request for Eliquis received.  Last office visit: [name] 01/06/2020 Scr: 0.92, 12/01/2019 Age: 79 Weight: 69.7 kg   Prescription refill sent.

## 2020-02-29 ENCOUNTER — Ambulatory Visit
Admission: RE | Admit: 2020-02-29 | Discharge: 2020-02-29 | Disposition: A | Discharge status: Home / Self Care | Payer: Medicare Other | Source: Ambulatory Visit | Attending: General Surgery | Admitting: General Surgery

## 2020-02-29 ENCOUNTER — Other Ambulatory Visit: Payer: Self-pay

## 2020-02-29 ENCOUNTER — Other Ambulatory Visit: Payer: Self-pay | Admitting: General Surgery

## 2020-02-29 DIAGNOSIS — R928 Other abnormal and inconclusive findings on diagnostic imaging of breast: Secondary | ICD-10-CM | POA: Diagnosis not present

## 2020-02-29 DIAGNOSIS — N6489 Other specified disorders of breast: Secondary | ICD-10-CM | POA: Diagnosis not present

## 2020-02-29 DIAGNOSIS — N641 Fat necrosis of breast: Secondary | ICD-10-CM

## 2020-03-05 ENCOUNTER — Other Ambulatory Visit: Payer: Self-pay | Admitting: Neurology

## 2020-03-06 NOTE — Telephone Encounter (Signed)
Rx(s) sent to pharmacy electronically.  

## 2020-03-12 ENCOUNTER — Other Ambulatory Visit: Payer: Self-pay | Admitting: General Surgery

## 2020-03-12 ENCOUNTER — Ambulatory Visit
Admission: RE | Admit: 2020-03-12 | Discharge: 2020-03-12 | Disposition: A | Discharge status: Home / Self Care | Payer: Medicare Other | Source: Ambulatory Visit | Attending: General Surgery | Admitting: General Surgery

## 2020-03-12 ENCOUNTER — Other Ambulatory Visit: Payer: Self-pay

## 2020-03-12 DIAGNOSIS — N6311 Unspecified lump in the right breast, upper outer quadrant: Secondary | ICD-10-CM | POA: Diagnosis not present

## 2020-03-12 DIAGNOSIS — N641 Fat necrosis of breast: Secondary | ICD-10-CM

## 2020-03-29 ENCOUNTER — Other Ambulatory Visit: Payer: Self-pay | Admitting: Family Medicine

## 2020-03-29 DIAGNOSIS — G609 Hereditary and idiopathic neuropathy, unspecified: Secondary | ICD-10-CM

## 2020-03-29 NOTE — Telephone Encounter (Signed)
RF request for Gabapentin LOV: 12/01/19 Next ov: 05/09/20 Last written:06/16/19(360,3)  Medication pending. Please advise, thanks.

## 2020-03-30 NOTE — Telephone Encounter (Signed)
Patient was made aware refill sent. °

## 2020-04-01 ENCOUNTER — Other Ambulatory Visit: Payer: Self-pay | Admitting: Cardiology

## 2020-04-19 NOTE — Progress Notes (Signed)
Assessment/Plan:   1.  Parkinsons Disease  -pt wants to increase dosage for tremor.  I did tell her that I usually don't recommend increase med for tremor. She initially wanted to do that but ultimately decided not to.  I did tell her she could take an additional 1/2 to 1 tablet prn tremor.  -will refer to PT in [place] for Parkinsons Disease  -safety discussed   Subjective:   Cassie White was seen today in follow up for Parkinsons disease.  My previous records were reviewed prior to todays visit as well as outside records available to me. This patient is accompanied in the office by her child who supplements the history.Pt denies falls. Using walker at all times. Pt denies lightheadedness, near syncope.  No hallucinations.  Mood has been good. Noting more tremor in the R hand - having to eat with the L hand.  She admits that she is not exercising.  She had a brow lift for ptosis on the left in July at [facility].  Current prescribed movement disorder medications: Carbidopa/levodopa 25/100, 1 tablet 3 times per day   PREVIOUS MEDICATIONS: Sinemet  ALLERGIES:   Allergies  Allergen Reactions  . Sulfa Antibiotics Swelling    face  . Tizanidine Other (See Comments)    delirium    CURRENT MEDICATIONS:  Outpatient Encounter Medications as of 04/27/2020  Medication Sig  . alendronate (FOSAMAX) 70 MG tablet Take 1 tablet (70 mg total) by mouth once a week. Take with a full glass of water on an empty stomach.  . Biotin 5000 MCG CAPS Take 5,000 mcg by mouth daily.  . Calcium Carbonate (CALCIUM 600 PO) Take 600 mg by mouth 2 (two) times daily.   . carbidopa-levodopa (SINEMET IR) 25-100 MG tablet TAKE 1 TABLET BY MOUTH THREE TIMES A DAY  . cyanocobalamin (,VITAMIN B-12,) 1000 MCG/ML injection INJECT 1 ML (1,000 MCG TOTAL) INTO THE MUSCLE EVERY 30 (THIRTY) DAYS.  Marland Kitchen ELIQUIS 5 MG TABS tablet TAKE 1 TABLET BY MOUTH TWICE A DAY  . Fexofenadine HCl (MUCINEX ALLERGY PO) Take by mouth as needed.     . flecainide (TAMBOCOR) 100 MG tablet TAKE 1 TABLET BY MOUTH TWICE A DAY  . gabapentin (NEURONTIN) 300 MG capsule TAKE 1 CAPSULE BY MOUTH AT LUNCH, 1 AT DINNER, AND 2 AT BEDTIME  . metoprolol tartrate (LOPRESSOR) 25 MG tablet TAKE 1 TABLET BY MOUTH TWICE A DAY  . nitroGLYCERIN (NITROSTAT) 0.4 MG SL tablet Place 1 tablet (0.4 mg total) under the tongue every 5 (five) minutes as needed for chest pain.  . Omega-3 Fatty Acids (FISH OIL) 1200 MG CAPS Take 1,000 mg by mouth 2 (two) times daily.   . pravastatin (PRAVACHOL) 40 MG tablet TAKE 1 TABLET BY MOUTH EVERY DAY  . [DISCONTINUED] HYDROcodone-acetaminophen (NORCO/VICODIN) 5-325 MG tablet Take 1 tablet by mouth every 8 (eight) hours. (Patient not taking: Reported on 04/27/2020)   No facility-administered encounter medications on file as of 04/27/2020.    Objective:   PHYSICAL EXAMINATION:    VITALS:   Vitals:   04/27/20 1101  BP: 113/65  Pulse: (!) 57  SpO2: 96%  Weight: 154 lb (69.9 kg)  Height: 5\' 1"  (1.549 m)    GEN:  The patient appears stated age and is in NAD. HEENT:  Normocephalic, atraumatic.  The mucous membranes are moist. The superficial temporal arteries are without ropiness or tenderness. CV:  RRR Lungs:  CTAB Neck/HEME:  There are no carotid bruits bilaterally.  Neurological examination:  Orientation: The patient is alert and oriented x3. Cranial nerves: There is good facial symmetry with facial hypomimia. The speech is fluent and clear. Soft palate rises symmetrically and there is no tongue deviation. Hearing is intact to conversational tone. Sensation: Sensation is intact to light touch throughout Motor: Strength is at least antigravity x4.  Movement examination: Tone: There is normal tone in the UE/LE Abnormal movements: rare tremor in the RUE Coordination:  There is no decremation with RAM's Gait and Station: The patient has mild difficulty arising out of a deep-seated chair without the use of the hands. The  patient's stride length is decreased without the walker - with the walker she has some festination.    I have reviewed and interpreted the following labs independently    Chemistry      Component Value Date/Time   NA 139 12/01/2019 1155   NA 145 (H) 06/02/2018 1514   K 4.8 12/01/2019 1155   CL 105 12/01/2019 1155   CO2 30 12/01/2019 1155   BUN 20 12/01/2019 1155   BUN 18 06/02/2018 1514   CREATININE 0.92 12/01/2019 1155      Component Value Date/Time   CALCIUM 9.7 12/01/2019 1155   ALKPHOS 77 12/01/2019 1155   AST 15 12/01/2019 1155   ALT 7 12/01/2019 1155   BILITOT 0.6 12/01/2019 1155       Lab Results  Component Value Date   WBC 4.9 03/09/2019   HGB 12.3 03/12/2019   HCT 37.4 03/12/2019   MCV 97.0 03/09/2019   PLT 166 03/09/2019    Lab Results  Component Value Date   TSH 1.32 04/21/2019     Total time spent on today's visit was 30 minutes, including both face-to-face time and nonface-to-face time.  Time included that spent on review of records (prior notes available to me/labs/imaging if pertinent), discussing treatment and goals, answering patient's questions and coordinating care.  Cc:  Tammi Sou, MD

## 2020-04-27 ENCOUNTER — Other Ambulatory Visit: Payer: Self-pay

## 2020-04-27 ENCOUNTER — Ambulatory Visit (INDEPENDENT_AMBULATORY_CARE_PROVIDER_SITE_OTHER): Payer: Medicare Other | Admitting: Neurology

## 2020-04-27 ENCOUNTER — Encounter: Payer: Self-pay | Admitting: Neurology

## 2020-04-27 VITALS — BP 113/65 | HR 57 | Ht 61.0 in | Wt 154.0 lb

## 2020-04-27 DIAGNOSIS — G20 Parkinson's disease: Secondary | ICD-10-CM

## 2020-04-27 NOTE — Patient Instructions (Addendum)
Continue carbidopa/levodopa 25/100, 1 at 8am/noon/4pm.  You can take an additional 1/2 tablet to 1 tablet as needed if you want to see if that helps tremor.  We will refer to PT for Parkinsons Disease.      The physicians and staff at [facility] are committed to providing excellent care. You may receive a survey requesting feedback about your experience at our office. We strive to receive "very good" responses to the survey questions. If you feel that your experience would prevent you from giving the office a "very good " response, please contact our office to try to remedy the situation. We may be reached at [phone]. Thank you for taking the time out of your busy day to complete the survey.

## 2020-04-27 NOTE — Addendum Note (Signed)
Addended by: Ulice Brilliant T on: 04/27/2020 11:52 AM   Modules accepted: Orders

## 2020-05-08 ENCOUNTER — Ambulatory Visit: Payer: Medicare Other | Admitting: Family Medicine

## 2020-05-18 ENCOUNTER — Ambulatory Visit: Payer: Medicare Other | Admitting: Neurology

## 2020-06-04 ENCOUNTER — Ambulatory Visit: Payer: Medicare Other | Admitting: Family Medicine

## 2020-07-07 NOTE — Progress Notes (Signed)
Cardiology Office Note   Date:  07/12/2020   ID:  Cassie White, DOB 22-Jun-1941, MRN [id number]  PCP:  Tammi Sou, MD  Cardiologist:  Nickey Canedo [place], MD EP: None  Chief Complaint  Patient presents with  . Atrial Fibrillation      History of Present Illness: Cassie White is a 79 y.o. female with PMH of paroxysmal atrial fibrillation, MVP with mitral regurgitation, HLD, and GERD, who presents for follow up.  She has a history of Afib managed with  flecainide. She is on Eliquis for anticoagulation.  Her last ischemic evaluation was a NST in 2017 to rule out pro arrhythmia and the ischemic heart disease, there was moderate sized and intensity partially reversible anterior and apical defect, EF was 64%, likely representing shifting breast artifact, overall it was considered intermediate risk study. I personally reviewed the images and felt this was low risk. She has no anginal symptoms.  Her last echocardiogram in 2017 showed EF 55-60%, mild AI, mild MR, with peak PA pressures 36 mmHg.  She is followed by Neurology for Parkinson's disease.   She has been doing well from a cardiac standpoint since her last visit. She is limited in activity by back pain. Ambulates with a walker. She denies any chest pain or SOB. No complaints of palpitations or symptoms c/w recurrent atrial fibrillation. No complaints of dizziness, lightheadedness, syncope, LE edema, orthopnea, or PND. No bleeding on Eliquis    Past Medical History:  Diagnosis Date  . Abnormal mammogram of left breast    Likely benign microcalcifications--repeat L diag mammo 03/24/2017.  COMPLEX SCLEROSING LESION WITH CALCIFICATIONS --no sign of malignancy.  Excision recommended--pt has been referred to Dr. Donne Hazel, who recommended f/u mammo and this was normal 12/2017--consider rpt 1 yr per rad.  . Arthritis    DJD, back and both hips  . Chronic low back pain without sciatica    Lumbar spondylosis + scoliosis.  Summer 2017  Dr. Nelva Bush did ESI and pt got no relief.  Pt then saw Dr. Maia Petties [facility]: felt she had facet mediated pain; plan for B L345 MBB (??).  Marland Kitchen Diverticulosis    on colonoscopy 2004  . GERD (gastroesophageal reflux disease)   . Hx of cardiovascular stress test    Lexiscan Myoview (2/14):  Low risk, small ant defect likely breast attenuation, no ischemia, EF 69% (no change from 2010).    . Hyperlipidemia   . Hypertension    per pt  . Idiopathic scoliosis of thoracolumbar region   . Lumbar back pain   . Mitral regurgitation    Echo (2/14):  EF 60-65%, Gr 1 DD, mild AI, mild bileaflet MVP, mod post directed MR, mild LAE, PASP 35.  Marland Kitchen MVP (mitral valve prolapse)   . 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  Responding to gabapentin (Dr. Brett Fairy).  . Osteoporosis, senile    stable left hip 2015 (took fosamax x 63yrs, T-score -2.7 when she stopped bisphosphonate).  Rpt DEXA 84mo later (09/2016) T-score -.3.1 (different machine, though). 11/2019 DEXA T-score -4. Recommended restart of fosamax  . PAF (paroxysmal atrial fibrillation) (HCC)    Dr. Martinique: Flecainide, metopr, apxiaban.  08/2017-->persistent a-fib w/RVR, flecainide increased and she converted back to NSR and felt better.  100mg  bid flecainide continued.  . Parkinson's disease ([place]) 2020 dx   started sinemet 05/2019 (Dr. Carles Collet)  . Peripheral neuropathy 2008   Idiopathic vs familial (motor>sensory) vs lumbar disc  dz: gabapentin helpful  . Shoulder pain left   RC surg 09/2014    Past Surgical History:  Procedure Laterality Date  . BREAST BIOPSY  04/10/2017   Left breast core needle biopsy of calcifications.  COMPLEX SCLEROSING LESION WITH CALCIFICATIONS --no sign of malignancy.  Excision recommended--pt has been referred to Dr. Donne Hazel, who recommended rpt mammo and this was NORMAL 12/2017.  Marland Kitchen BREAST EXCISIONAL BIOPSY Right 2010   Benign  . BREAST SURGERY  Nov 2010   Benign  biopsy, right  . CHOLECYSTECTOMY  03/2010   Dr.Gross  . COLONOSCOPY  10/03/2002   No polyps.  Repeat 10 yrs recommended but pt declines.  Pt declined cologuard 08/2016 but changed her mind and this test was NEG on 03/23/17.  . COMBINED HYSTERECTOMY VAGINAL / OOPHORECTOMY / A&P REPAIR  3893   uncertain if ovaries were removed or not  . CYSTOCELE REPAIR    . DEXA  11/2019   T score -4  . Lumpectomy  06/2009   "Fatty Necrosis"  . PFT's  2014   NORMAL  . REVERSE SHOULDER ARTHROPLASTY Right 03/11/2019   Procedure: REVERSE TOTAL SHOULDER ARTHROPLASTY;  Surgeon: Netta Cedars, MD;  Location: [facility];  Service: Orthopedics;  Laterality: Right;  Intersclene block  . ROTATOR CUFF REPAIR Left 10/02/14   Dr. Veverly Fells  . TONSILLECTOMY    . TOTAL KNEE ARTHROPLASTY  09/2009   Right ;Dr Alvan Dame  . TOTAL KNEE ARTHROPLASTY Left 03/10/2016   Procedure: TOTAL KNEE ARTHROPLASTY;  Surgeon: Paralee Cancel, MD;  Location: [facility];  Service: Orthopedics;  Laterality: Left;  . TRANSTHORACIC ECHOCARDIOGRAM  09/2012; 09/27/15   2014: EF 60-65%, Gr 1 DD, mild AI, mild bileaflet MVP, mod post directed MR, mild LAE, PASP 35.  Repeat 2017: EF 55-60%, mild AR, mild MVP and mild MV regurg.  Marland Kitchen VAGINAL HYSTERECTOMY     For Uterine Deviation      Current Outpatient Medications  Medication Sig Dispense Refill  . Biotin 5000 MCG CAPS Take 5,000 mcg by mouth daily.    . Calcium Carbonate (CALCIUM 600 PO) Take 600 mg by mouth 2 (two) times daily.     . carbidopa-levodopa (SINEMET IR) 25-100 MG tablet TAKE 1 TABLET BY MOUTH THREE TIMES A DAY 270 tablet 1  . cyanocobalamin (,VITAMIN B-12,) 1000 MCG/ML injection INJECT 1 ML (1,000 MCG TOTAL) INTO THE MUSCLE EVERY 30 (THIRTY) DAYS. 10 mL 1  . ELIQUIS 5 MG TABS tablet TAKE 1 TABLET BY MOUTH TWICE A DAY 60 tablet 5  . flecainide (TAMBOCOR) 100 MG tablet TAKE 1 TABLET BY MOUTH TWICE A DAY 180 tablet 3  . gabapentin (NEURONTIN) 300 MG capsule TAKE 1 CAPSULE BY MOUTH AT LUNCH, 1 AT DINNER, AND 2  AT BEDTIME 360 capsule 3  . metoprolol tartrate (LOPRESSOR) 25 MG tablet TAKE 1 TABLET BY MOUTH TWICE A DAY 180 tablet 1  . nitroGLYCERIN (NITROSTAT) 0.4 MG SL tablet Place 1 tablet (0.4 mg total) under the tongue every 5 (five) minutes as needed for chest pain. 25 tablet 5  . Omega-3 Fatty Acids (FISH OIL) 1200 MG CAPS Take 1,000 mg by mouth 2 (two) times daily.     . pravastatin (PRAVACHOL) 40 MG tablet TAKE 1 TABLET BY MOUTH EVERY DAY 90 tablet 3   No current facility-administered medications for this visit.    Allergies:   Sulfa antibiotics and Tizanidine    Social History:  The patient  reports that she has never smoked. She has never used  smokeless tobacco. She reports that she does not drink alcohol and does not use drugs.   Family History:  The patient's family history includes COPD in her brother; Coronary artery disease in her brother; Healthy in her daughter and son; Heart attack in her father; Heart attack (age of onset: 36) in her son; Ovarian cancer in her mother; Stroke (age of onset: 20) in her father.    ROS:  Please see the history of present illness.   Otherwise, review of systems are positive for none.   All other systems are reviewed and negative.    PHYSICAL EXAM: VS:  BP 136/69   Pulse (!) 54   Ht 5\' 1"  (1.549 m)   Wt 153 lb (69.4 kg)   SpO2 98%   BMI 28.91 kg/m  , BMI Body mass index is 28.91 kg/m. GEN: Well nourished, well developed, in no acute distress HEENT: sclera anicteric, left brow ptosis Neck: no JVD, carotid bruits, or masses Cardiac: bradycardic, regular rhythm, no murmurs, rubs, or gallops, no edema  Respiratory:  clear to auscultation bilaterally, normal work of breathing GI: soft, nontender, nondistended, + BS MS: no deformity or atrophy Skin: warm and dry, no rash Neuro:  Strength and sensation are intact Psych: euthymic mood, full affect   EKG:  EKG is not ordered today.   Recent Labs: 12/01/2019: ALT 7; BUN 20; Creatinine, Ser 0.92;  Potassium 4.8; Sodium 139    Lipid Panel    Component Value Date/Time   CHOL 155 12/01/2019 1155   CHOL 211 (H) 04/28/2014 1026   TRIG 118.0 12/01/2019 1155   TRIG 95 04/28/2014 1026   HDL 66.20 12/01/2019 1155   HDL 101 04/28/2014 1026   CHOLHDL 2 12/01/2019 1155   VLDL 23.6 12/01/2019 1155   LDLCALC 65 12/01/2019 1155   LDLCALC 91 04/28/2014 1026   LDLDIRECT 87.5 09/07/2012 1022      Wt Readings from Last 3 Encounters:  07/12/20 153 lb (69.4 kg)  04/27/20 154 lb (69.9 kg)  01/06/20 153 lb 9.6 oz (69.7 kg)      Other studies Reviewed: Additional studies/ records that were reviewed today include:    Echo 09/27/2015 LV EF: 55% - 60%  Study Conclusions  - Left ventricle: The cavity size was normal. There was mild concentric hypertrophy. Systolic function was normal. The estimated ejection fraction was in the range of 55% to 60%. - Aortic valve: There was mild regurgitation. - Mitral valve: Mild anterior leafelt prolapse. There was mild regurgitation. - Atrial septum: No defect or patent foramen ovale was identified. - Pulmonary arteries: PA peak pressure: 36 mm Hg (S).   Myoview 10/18/2015 Study Highlights    The left ventricular ejection fraction is moderately decreased (30-44%).  Nuclear stress EF: 64%.  There was no ST segment deviation noted during stress.  No T wave inversion was noted during stress.  Defect 1: There is a medium defect of moderate severity.  This is an intermediate risk study.  Findings consistent with prior myocardial infarction with peri-infarct ischemia.  Moderate size and intensity partially reversible anterior and apical wall defect (SDS 4). LVEF 64% with normal wall motion. This likely represents shifting breast artifact given normal wall motion and LV function. Clinical correlation is advised. This is an intermediate risk study.     ASSESSMENT AND PLAN:  1.  Paroxysmal atrial fibrillation: no recurrence on  Flecainide. No complaints of bleeding on eliquis - Continue flecainide for rhythm control - Continue metoprolol for rate control -  Continue eliquis for stroke ppx  2. HLD: LDL 65 11/2019 - Continue pravastatin and omega 3  3. MVP with mitral regurgitation: mild MR on echo, though has not had one in several years. Exam unremarkable.  - Continue to monitor for now   Current medicines are reviewed at length with the patient today.  The patient does not have concerns regarding medicines.  The following changes have been made:  As above  Labs/ tests ordered today include:   No orders of the defined types were placed in this encounter.    Disposition:   FU with Dr. Martinique in one year  Signed, Breyana Follansbee Martinique, MD  07/12/2020 10:12 AM

## 2020-07-11 DIAGNOSIS — M25561 Pain in right knee: Secondary | ICD-10-CM | POA: Diagnosis not present

## 2020-07-12 ENCOUNTER — Encounter: Payer: Self-pay | Admitting: Cardiology

## 2020-07-12 ENCOUNTER — Ambulatory Visit (INDEPENDENT_AMBULATORY_CARE_PROVIDER_SITE_OTHER): Payer: Medicare Other | Admitting: Cardiology

## 2020-07-12 ENCOUNTER — Other Ambulatory Visit: Payer: Self-pay

## 2020-07-12 VITALS — BP 136/69 | HR 54 | Ht 61.0 in | Wt 153.0 lb

## 2020-07-12 DIAGNOSIS — I341 Nonrheumatic mitral (valve) prolapse: Secondary | ICD-10-CM | POA: Diagnosis not present

## 2020-07-12 DIAGNOSIS — E785 Hyperlipidemia, unspecified: Secondary | ICD-10-CM

## 2020-07-12 DIAGNOSIS — I480 Paroxysmal atrial fibrillation: Secondary | ICD-10-CM | POA: Diagnosis not present

## 2020-07-12 DIAGNOSIS — I340 Nonrheumatic mitral (valve) insufficiency: Secondary | ICD-10-CM | POA: Diagnosis not present

## 2020-07-17 DIAGNOSIS — Z23 Encounter for immunization: Secondary | ICD-10-CM | POA: Diagnosis not present

## 2020-07-18 ENCOUNTER — Other Ambulatory Visit: Payer: Self-pay | Admitting: Family Medicine

## 2020-07-21 ENCOUNTER — Other Ambulatory Visit: Payer: Self-pay | Admitting: Neurology

## 2020-07-26 ENCOUNTER — Other Ambulatory Visit: Payer: Self-pay | Admitting: Family Medicine

## 2020-07-26 DIAGNOSIS — I4891 Unspecified atrial fibrillation: Secondary | ICD-10-CM

## 2020-08-02 DIAGNOSIS — Z23 Encounter for immunization: Secondary | ICD-10-CM | POA: Diagnosis not present

## 2020-08-08 ENCOUNTER — Ambulatory Visit: Payer: Medicare Other

## 2020-08-08 ENCOUNTER — Other Ambulatory Visit: Payer: Self-pay | Admitting: Cardiology

## 2020-08-08 ENCOUNTER — Other Ambulatory Visit: Payer: Self-pay | Admitting: Family Medicine

## 2020-08-08 DIAGNOSIS — I4891 Unspecified atrial fibrillation: Secondary | ICD-10-CM

## 2020-08-08 NOTE — Telephone Encounter (Signed)
Prescription refill request for Eliquis received. Indication: atrial fibrillation Last office visit:12/21 [name] Scr: 0.92  4/21 Age: 80 Weight:69.4 kg  Prescription refilled

## 2020-08-10 ENCOUNTER — Other Ambulatory Visit: Payer: Self-pay | Admitting: Cardiology

## 2020-08-10 NOTE — Telephone Encounter (Signed)
°*  STAT* If patient is at the pharmacy, call can be transferred to refill team.   1. Which medications need to be refilled? (please list name of each medication and dose if known)  ELIQUIS 5 MG TABS tablet  2. Which pharmacy/location (including street and city if local pharmacy) is medication to be sent to? [id number] - [place], [address]  3. Do they need a 30 day or 90 day supply? 30  Patient said she is out of refills. She will need a refill before 09/02/19

## 2020-08-21 ENCOUNTER — Ambulatory Visit: Payer: Medicare Other | Admitting: Family Medicine

## 2020-08-31 ENCOUNTER — Telehealth: Payer: Self-pay | Admitting: Family Medicine

## 2020-08-31 NOTE — Telephone Encounter (Signed)
Pt needs an appointment prior to any refills. Pt will CB to sched appt. Pt has approx 7 pills left

## 2020-08-31 NOTE — Telephone Encounter (Signed)
Patient requesting refill of metoprolol. [facility] in [place].

## 2020-09-03 DIAGNOSIS — L570 Actinic keratosis: Secondary | ICD-10-CM | POA: Diagnosis not present

## 2020-09-06 ENCOUNTER — Other Ambulatory Visit: Payer: Self-pay

## 2020-09-07 ENCOUNTER — Ambulatory Visit (INDEPENDENT_AMBULATORY_CARE_PROVIDER_SITE_OTHER): Payer: Medicare Other | Admitting: Family Medicine

## 2020-09-07 ENCOUNTER — Encounter: Payer: Self-pay | Admitting: Family Medicine

## 2020-09-07 VITALS — BP 113/68 | HR 54 | Temp 97.3°F | Resp 16 | Ht 61.0 in | Wt 155.0 lb

## 2020-09-07 DIAGNOSIS — K219 Gastro-esophageal reflux disease without esophagitis: Secondary | ICD-10-CM | POA: Diagnosis not present

## 2020-09-07 DIAGNOSIS — Z23 Encounter for immunization: Secondary | ICD-10-CM

## 2020-09-07 DIAGNOSIS — E7800 Pure hypercholesterolemia, unspecified: Secondary | ICD-10-CM | POA: Diagnosis not present

## 2020-09-07 DIAGNOSIS — Z7901 Long term (current) use of anticoagulants: Secondary | ICD-10-CM | POA: Diagnosis not present

## 2020-09-07 DIAGNOSIS — I480 Paroxysmal atrial fibrillation: Secondary | ICD-10-CM

## 2020-09-07 DIAGNOSIS — I10 Essential (primary) hypertension: Secondary | ICD-10-CM | POA: Diagnosis not present

## 2020-09-07 LAB — CBC
HCT: 42.1 % (ref 36.0–46.0)
Hemoglobin: 13.9 g/dL (ref 12.0–15.0)
MCHC: 33.1 g/dL (ref 30.0–36.0)
MCV: 93.8 fl (ref 78.0–100.0)
Platelets: 142 10*3/uL — ABNORMAL LOW (ref 150.0–400.0)
RBC: 4.49 Mil/uL (ref 3.87–5.11)
RDW: 13.7 % (ref 11.5–15.5)
WBC: 5.8 10*3/uL (ref 4.0–10.5)

## 2020-09-07 LAB — COMPREHENSIVE METABOLIC PANEL
ALT: 4 U/L (ref 0–35)
AST: 15 U/L (ref 0–37)
Albumin: 4.1 g/dL (ref 3.5–5.2)
Alkaline Phosphatase: 68 U/L (ref 39–117)
BUN: 14 mg/dL (ref 6–23)
CO2: 26 mEq/L (ref 19–32)
Calcium: 9.9 mg/dL (ref 8.4–10.5)
Chloride: 107 mEq/L (ref 96–112)
Creatinine, Ser: 0.8 mg/dL (ref 0.40–1.20)
GFR: 70.08 mL/min (ref >60.00)
Glucose, Bld: 89 mg/dL (ref 70–99)
Potassium: 4.4 mEq/L (ref 3.5–5.1)
Sodium: 139 mEq/L (ref 135–145)
Total Bilirubin: 0.6 mg/dL (ref 0.2–1.2)
Total Protein: 6.5 g/dL (ref 6.0–8.3)

## 2020-09-07 MED ORDER — PANTOPRAZOLE SODIUM 40 MG PO TBEC: 40.0000 mg | DELAYED_RELEASE_TABLET | Freq: Every day | ORAL | 1 refills | Status: DC

## 2020-09-07 MED ORDER — ZOSTER VAC RECOMB ADJUVANTED 50 MCG/0.5ML IM SUSR: 0.5000 mL | Freq: Once | INTRAMUSCULAR | 1 refills | Status: AC

## 2020-09-07 NOTE — Progress Notes (Signed)
OFFICE VISIT  09/07/2020  CC:  Chief Complaint  Patient presents with  . Follow-up    RCI, pt is not fasting   HPI:    Patient is a 80 y.o. Caucasian female who presents accompanied by her daughter Butch Penny for f/u HTN, HLD, and osteoporosis. She has PAF and is on DOAC and metoprolol.  Also has parkinson's dz, followed by Dr. Carles Collet.   I last saw her about 9 months ago. A/P as of that time: "1) Osteoporosis: T score recently was -4.0.  She definitely needs med tx in addition to Ca and vit D. She is ok with staying on the fosamax at this time despite her initial hesitancy about the potential bone effects on the jaw. She'll let me know if she changes he mind and we can change her to raloxifene.  2) HTN: The current medical regimen is effective;  continue present plan and medications. Lytes/cr today.  3) HLD: tolerating statin. FLP, CMET today.  4) PAF, on chronic anticoag: regular rhythm, no symptoms.  No sign of bleeding from DOAC. Keep routine f/u with cardiologist."  INTERIM HX:  HTN: no home bp monitoring but is compliant with metoprolol. HLD: tolerating pravachol 40mg  qd.   Says diet is good.  Last 4-5 mo at least->Feels rising substernal sensation that is a burning sensation intermittently, feels it 1-2 times per day.  Worse with acidic foods. Tums helpful.  She doesn't overeat.  No dysphagia or pain with swallowing. Not taking any PPI or H2 blocker.      PAF: last cardiology f/u 07/12/20 pt was stable and was continued on flecainide for rhythm control, metop for rate control, and eliquis 5mg  bid for stroke prophylaxis.  Parkinson's: most recent neuro f/u 04/27/20 and R arm tremor was more bothersome.  She was tentatively given the go-ahead to try 1/2 to 1 tab of sinemet extra prn and was also referred to PT.  ROS: no fevers, no CP, no SOB, no wheezing, no cough, no dizziness, no HAs, no rashes, no melena/hematochezia.  No polyuria or polydipsia.  No myalgias or  arthralgias.  No focal weakness, paresthesias, or tremors.  No acute vision or hearing abnormalities. No n/v/d or abd pain.  No palpitations Past Medical History:  Diagnosis Date  . Abnormal mammogram of left breast    Likely benign microcalcifications--repeat L diag mammo 03/24/2017.  COMPLEX SCLEROSING LESION WITH CALCIFICATIONS --no sign of malignancy.  Excision recommended--pt has been referred to Dr. Donne Hazel, who recommended f/u mammo and this was normal 12/2017--consider rpt 1 yr per rad.  . Arthritis    DJD, back and both hips  . Chronic low back pain without sciatica    Lumbar spondylosis + scoliosis.  Summer 2017 Dr. Nelva Bush did ESI and pt got no relief.  Pt then saw Dr. Maia Petties [facility]: felt she had facet mediated pain; plan for B L345 MBB (??).  Marland Kitchen Diverticulosis    on colonoscopy 2004  . GERD (gastroesophageal reflux disease)   . Hx of cardiovascular stress test    Lexiscan Myoview (2/14):  Low risk, small ant defect likely breast attenuation, no ischemia, EF 69% (no change from 2010).    . Hyperlipidemia   . Hypertension    per pt  . Idiopathic scoliosis of thoracolumbar region   . Lumbar back pain   . Mitral regurgitation    Echo (2/14):  EF 60-65%, Gr 1 DD, mild AI, mild bileaflet MVP, mod post directed MR, mild LAE, PASP 35.  Marland Kitchen  MVP (mitral valve prolapse)   . 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  Responding to gabapentin (Dr. Brett Fairy).  . Osteoporosis, senile    stable left hip 2015 (took fosamax x 20yrs, T-score -2.7 when she stopped bisphosphonate).  Rpt DEXA 83mo later (09/2016) T-score -.3.1 (different machine, though). 11/2019 DEXA T-score -4. Recommended restart of fosamax  . PAF (paroxysmal atrial fibrillation) (HCC)    Dr. Martinique: Flecainide, metopr, apxiaban.  08/2017-->persistent a-fib w/RVR, flecainide increased and she converted back to NSR and felt better.  100mg  bid flecainide continued.  .  Parkinson's disease ([place]) 2020 dx   started sinemet 05/2019 (Dr. Carles Collet)  . Peripheral neuropathy 2008   Idiopathic vs familial (motor>sensory) vs lumbar disc dz: gabapentin helpful  . Shoulder pain left   RC surg 09/2014    Past Surgical History:  Procedure Laterality Date  . BREAST BIOPSY  04/10/2017   Left breast core needle biopsy of calcifications.  COMPLEX SCLEROSING LESION WITH CALCIFICATIONS --no sign of malignancy.  Excision recommended--pt has been referred to Dr. Donne Hazel, who recommended rpt mammo and this was NORMAL 12/2017.  Marland Kitchen BREAST EXCISIONAL BIOPSY Right 2010   Benign  . BREAST SURGERY  Nov 2010   Benign biopsy, right  . CHOLECYSTECTOMY  03/2010   Dr.Gross  . COLONOSCOPY  10/03/2002   No polyps.  Repeat 10 yrs recommended but pt declines.  Pt declined cologuard 08/2016 but changed her mind and this test was NEG on 03/23/17.  . COMBINED HYSTERECTOMY VAGINAL / OOPHORECTOMY / A&P REPAIR  4268   uncertain if ovaries were removed or not  . CYSTOCELE REPAIR    . DEXA  11/2019   T score -4  . Lumpectomy  06/2009   "Fatty Necrosis"  . PFT's  2014   NORMAL  . REVERSE SHOULDER ARTHROPLASTY Right 03/11/2019   Procedure: REVERSE TOTAL SHOULDER ARTHROPLASTY;  Surgeon: Netta Cedars, MD;  Location: [facility];  Service: Orthopedics;  Laterality: Right;  Intersclene block  . ROTATOR CUFF REPAIR Left 10/02/14   Dr. Veverly Fells  . TONSILLECTOMY    . TOTAL KNEE ARTHROPLASTY  09/2009   Right ;Dr Alvan Dame  . TOTAL KNEE ARTHROPLASTY Left 03/10/2016   Procedure: TOTAL KNEE ARTHROPLASTY;  Surgeon: Paralee Cancel, MD;  Location: [facility];  Service: Orthopedics;  Laterality: Left;  . TRANSTHORACIC ECHOCARDIOGRAM  09/2012; 09/27/15   2014: EF 60-65%, Gr 1 DD, mild AI, mild bileaflet MVP, mod post directed MR, mild LAE, PASP 35.  Repeat 2017: EF 55-60%, mild AR, mild MVP and mild MV regurg.  Marland Kitchen VAGINAL HYSTERECTOMY     For Uterine Deviation     Outpatient Medications Prior to Visit  Medication Sig Dispense  Refill  . Biotin 5000 MCG CAPS Take 5,000 mcg by mouth daily.    . Calcium Carbonate (CALCIUM 600 PO) Take 600 mg by mouth 2 (two) times daily.     . carbidopa-levodopa (SINEMET IR) 25-100 MG tablet TAKE 1 TABLET BY MOUTH THREE TIMES A DAY 270 tablet 1  . cyanocobalamin (,VITAMIN B-12,) 1000 MCG/ML injection INJECT 1 ML (1,000 MCG TOTAL) INTO THE MUSCLE EVERY 30 (THIRTY) DAYS. 10 mL 1  . ELIQUIS 5 MG TABS tablet TAKE 1 TABLET BY MOUTH TWICE A DAY 60 tablet 5  . flecainide (TAMBOCOR) 100 MG tablet TAKE 1 TABLET BY MOUTH TWICE A DAY 180 tablet 3  . gabapentin (NEURONTIN) 300 MG capsule TAKE 1 CAPSULE BY MOUTH AT LUNCH, 1 AT DINNER, AND 2 AT  BEDTIME 360 capsule 3  . metoprolol tartrate (LOPRESSOR) 25 MG tablet TAKE 1 TABLET BY MOUTH TWICE A DAY 60 tablet 0  . Omega-3 Fatty Acids (FISH OIL) 1200 MG CAPS Take 1,000 mg by mouth 2 (two) times daily.     . pravastatin (PRAVACHOL) 40 MG tablet TAKE 1 TABLET BY MOUTH EVERY DAY 90 tablet 3  . nitroGLYCERIN (NITROSTAT) 0.4 MG SL tablet Place 1 tablet (0.4 mg total) under the tongue every 5 (five) minutes as needed for chest pain. (Patient not taking: Reported on 09/07/2020) 25 tablet 5   No facility-administered medications prior to visit.    Allergies  Allergen Reactions  . Sulfa Antibiotics Swelling    face  . Tizanidine Other (See Comments)    delirium    ROS As per HPI  PE: Vitals with BMI 09/07/2020 07/12/2020 04/27/2020  Height 5\' 1"  5\' 1"  5\' 1"   Weight 155 lbs 153 lbs 154 lbs  BMI 29.3 32.20 25.42  Systolic 706 237 628  Diastolic 68 69 65  Pulse 54 54 57   Gen: Alert, well appearing.  Patient is oriented to person, place, time, and situation. AFFECT: pleasant, lucid thought and speech. CV: Regular, mild brady (50s), soft "sucking" type systolic murmur, no diastolic murmur, no r/g.   LUNGS: CTA bilat, nonlabored resps, good aeration in all lung fields. EXT: no clubbing or cyanosis.  no edema.   LABS:  Lab Results  Component Value Date    TSH 1.32 04/21/2019   Lab Results  Component Value Date   WBC 4.9 03/09/2019   HGB 12.3 03/12/2019   HCT 37.4 03/12/2019   MCV 97.0 03/09/2019   PLT 166 03/09/2019   Lab Results  Component Value Date   CREATININE 0.92 12/01/2019   BUN 20 12/01/2019   NA 139 12/01/2019   K 4.8 12/01/2019   CL 105 12/01/2019   CO2 30 12/01/2019   Lab Results  Component Value Date   ALT 7 12/01/2019   AST 15 12/01/2019   ALKPHOS 77 12/01/2019   BILITOT 0.6 12/01/2019   Lab Results  Component Value Date   CHOL 155 12/01/2019   Lab Results  Component Value Date   HDL 66.20 12/01/2019   Lab Results  Component Value Date   LDLCALC 65 12/01/2019   Lab Results  Component Value Date   TRIG 118.0 12/01/2019   Lab Results  Component Value Date   CHOLHDL 2 12/01/2019   Lab Results  Component Value Date   HGBA1C  09/19/2010    5.3 (NOTE)                                                                       According to the ADA Clinical Practice Recommendations for 2011, when HbA1c is used as a screening test:   >=6.5%   Diagnostic of Diabetes Mellitus           (if abnormal result  is confirmed)  5.7-6.4%   Increased risk of developing Diabetes Mellitus  References:Diagnosis and Classification of Diabetes Mellitus,Diabetes BTDV,7616,07(PXTGG 1):S62-S69 and Standards of Medical Care in         Diabetes - 2011,Diabetes Care,2011,34  (Suppl 1):S11-S61.   IMPRESSION AND PLAN:  1) HTN: stable. Cont  lopressor 25mg  bid. Lytes/cr today.  2) HLD: tolerating pravachol 40mg  qd. Last lipids 9 mo ago EXCELLENT. She's not fasting today. Plan recheck FLP 6 mo. Hepatic panel checked today.  3) PAF: regular rhythm.  Asymptomatic. Followed by cardiology. Cont flecainide, lopressor, and eliquis. No sign of bleeding. CBC monitoring today.  4) GERD: start pantoprazole 40mg  qd, adjust diet as appropriate for this problem.  F/u 4-6 wks and if doing well then will discuss cutting back to prn use  vs switchover to H2 blocker.  5) Parkinson's dz: stable. Followed by Dr. Carles Collet. Cont sinemet.  An After Visit Summary was printed and given to the patient.  FOLLOW UP: No follow-ups on file.  Signed:  Crissie Sickles, MD           09/07/2020

## 2020-09-26 ENCOUNTER — Other Ambulatory Visit: Payer: Self-pay | Admitting: Family Medicine

## 2020-09-26 DIAGNOSIS — I4891 Unspecified atrial fibrillation: Secondary | ICD-10-CM

## 2020-09-29 ENCOUNTER — Other Ambulatory Visit: Payer: Self-pay | Admitting: Family Medicine

## 2020-10-12 ENCOUNTER — Other Ambulatory Visit: Payer: Self-pay | Admitting: Family Medicine

## 2020-10-12 ENCOUNTER — Ambulatory Visit: Payer: Medicare Other | Admitting: Family Medicine

## 2020-10-12 NOTE — Progress Notes (Deleted)
OFFICE VISIT  10/12/2020  CC: No chief complaint on file.   HPI:    Patient is a 80 y.o. Caucasian female who presents for 1 mo f/u GERD. A/P as of last visit: "1) HTN: stable. Cont lopressor 25mg  bid. Lytes/cr today.  2) HLD: tolerating pravachol 40mg  qd. Last lipids 9 mo ago EXCELLENT. She's not fasting today. Plan recheck FLP 6 mo. Hepatic panel checked today.  3) PAF: regular rhythm.  Asymptomatic. Followed by cardiology. Cont flecainide, lopressor, and eliquis. No sign of bleeding. CBC monitoring today.  4) GERD: start pantoprazole 40mg  qd, adjust diet as appropriate for this problem.  F/u 4-6 wks and if doing well then will discuss cutting back to prn use vs switchover to H2 blocker.  5) Parkinson's dz: stable. Followed by Dr. Carles Collet. Cont sinemet."  INTERIM HX: ***    Past Medical History:  Diagnosis Date  . Abnormal mammogram of left breast    Likely benign microcalcifications--repeat L diag mammo 03/24/2017.  COMPLEX SCLEROSING LESION WITH CALCIFICATIONS --no sign of malignancy.  Excision recommended--pt has been referred to Dr. Donne Hazel, who recommended f/u mammo and this was normal 12/2017--consider rpt 1 yr per rad.  . Arthritis    DJD, back and both hips  . Chronic low back pain without sciatica    Lumbar spondylosis + scoliosis.  Summer 2017 Dr. Nelva Bush did ESI and pt got no relief.  Pt then saw Dr. Maia Petties [facility]: felt she had facet mediated pain; plan for B L345 MBB (??).  Marland Kitchen Diverticulosis    on colonoscopy 2004  . GERD (gastroesophageal reflux disease)   . Hx of cardiovascular stress test    Lexiscan Myoview (2/14):  Low risk, small ant defect likely breast attenuation, no ischemia, EF 69% (no change from 2010).    . Hyperlipidemia   . Hypertension    per pt  . Idiopathic scoliosis of thoracolumbar region   . Lumbar back pain   . Mitral regurgitation    Echo (2/14):  EF 60-65%, Gr 1 DD, mild AI, mild bileaflet MVP, mod post  directed MR, mild LAE, PASP 35.  Marland Kitchen MVP (mitral valve prolapse)   . 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  Responding to gabapentin (Dr. Brett Fairy).  . Osteoporosis, senile    stable left hip 2015 (took fosamax x 60yrs, T-score -2.7 when she stopped bisphosphonate).  Rpt DEXA 28mo later (09/2016) T-score -.3.1 (different machine, though). 11/2019 DEXA T-score -4. Recommended restart of fosamax  . PAF (paroxysmal atrial fibrillation) (HCC)    Dr. [place]: Flecainide, metopr, apxiaban.  08/2017-->persistent a-fib w/RVR, flecainide increased and she converted back to NSR and felt better.  100mg  bid flecainide continued.  . Parkinson's disease ([place]) 2020 dx   started sinemet 05/2019 (Dr. Carles Collet)  . Peripheral neuropathy 2008   Idiopathic vs familial (motor>sensory) vs lumbar disc dz: gabapentin helpful  . Shoulder pain left   RC surg 09/2014    Past Surgical History:  Procedure Laterality Date  . BREAST BIOPSY  04/10/2017   Left breast core needle biopsy of calcifications.  COMPLEX SCLEROSING LESION WITH CALCIFICATIONS --no sign of malignancy.  Excision recommended--pt has been referred to Dr. Donne Hazel, who recommended rpt mammo and this was NORMAL 12/2017.  Marland Kitchen BREAST EXCISIONAL BIOPSY Right 2010   Benign  . BREAST SURGERY  Nov 2010   Benign biopsy, right  . CHOLECYSTECTOMY  03/2010   Dr.Gross  . COLONOSCOPY  10/03/2002   No polyps.  Repeat 10 yrs recommended but pt declines.  Pt declined cologuard 08/2016 but changed her mind and this test was NEG on 03/23/17.  . COMBINED HYSTERECTOMY VAGINAL / OOPHORECTOMY / A&P REPAIR  2956   uncertain if ovaries were removed or not  . CYSTOCELE REPAIR    . DEXA  11/2019   T score -4  . Lumpectomy  06/2009   "Fatty Necrosis"  . PFT's  2014   NORMAL  . REVERSE SHOULDER ARTHROPLASTY Right 03/11/2019   Procedure: REVERSE TOTAL SHOULDER ARTHROPLASTY;  Surgeon: Netta Cedars, MD;  Location: [facility];  Service:  Orthopedics;  Laterality: Right;  Intersclene block  . ROTATOR CUFF REPAIR Left 10/02/14   Dr. Veverly Fells  . TONSILLECTOMY    . TOTAL KNEE ARTHROPLASTY  09/2009   Right ;Dr Alvan Dame  . TOTAL KNEE ARTHROPLASTY Left 03/10/2016   Procedure: TOTAL KNEE ARTHROPLASTY;  Surgeon: Paralee Cancel, MD;  Location: [facility];  Service: Orthopedics;  Laterality: Left;  . TRANSTHORACIC ECHOCARDIOGRAM  09/2012; 09/27/15   2014: EF 60-65%, Gr 1 DD, mild AI, mild bileaflet MVP, mod post directed MR, mild LAE, PASP 35.  Repeat 2017: EF 55-60%, mild AR, mild MVP and mild MV regurg.  Marland Kitchen VAGINAL HYSTERECTOMY     For Uterine Deviation     Outpatient Medications Prior to Visit  Medication Sig Dispense Refill  . Biotin 5000 MCG CAPS Take 5,000 mcg by mouth daily.    . Calcium Carbonate (CALCIUM 600 PO) Take 600 mg by mouth 2 (two) times daily.     . carbidopa-levodopa (SINEMET IR) 25-100 MG tablet TAKE 1 TABLET BY MOUTH THREE TIMES A DAY 270 tablet 1  . cyanocobalamin (,VITAMIN B-12,) 1000 MCG/ML injection INJECT 1 ML (1,000 MCG TOTAL) INTO THE MUSCLE EVERY 30 (THIRTY) DAYS. 10 mL 1  . ELIQUIS 5 MG TABS tablet TAKE 1 TABLET BY MOUTH TWICE A DAY 60 tablet 5  . flecainide (TAMBOCOR) 100 MG tablet TAKE 1 TABLET BY MOUTH TWICE A DAY 180 tablet 3  . gabapentin (NEURONTIN) 300 MG capsule TAKE 1 CAPSULE BY MOUTH AT LUNCH, 1 AT DINNER, AND 2 AT BEDTIME 360 capsule 3  . metoprolol tartrate (LOPRESSOR) 25 MG tablet TAKE 1 TABLET BY MOUTH TWICE A DAY 180 tablet 0  . nitroGLYCERIN (NITROSTAT) 0.4 MG SL tablet Place 1 tablet (0.4 mg total) under the tongue every 5 (five) minutes as needed for chest pain. (Patient not taking: Reported on 09/07/2020) 25 tablet 5  . Omega-3 Fatty Acids (FISH OIL) 1200 MG CAPS Take 1,000 mg by mouth 2 (two) times daily.     . pantoprazole (PROTONIX) 40 MG tablet TAKE 1 TABLET BY MOUTH EVERY DAY 30 tablet 1  . pravastatin (PRAVACHOL) 40 MG tablet TAKE 1 TABLET BY MOUTH EVERY DAY 90 tablet 3   No facility-administered  medications prior to visit.    Allergies  Allergen Reactions  . Sulfa Antibiotics Swelling    face  . Tizanidine Other (See Comments)    delirium    ROS As per HPI  PE: Vitals with BMI 09/07/2020 07/12/2020 04/27/2020  Height 5\' 1"  5\' 1"  5\' 1"   Weight 155 lbs 153 lbs 154 lbs  BMI 29.3 21.30 86.57  Systolic 846 962 952  Diastolic 68 69 65  Pulse 54 54 57     ***  LABS:    Chemistry      Component Value Date/Time   NA 139 09/07/2020 1120   NA 145 (H) 06/02/2018 1514   K  4.4 09/07/2020 1120   CL 107 09/07/2020 1120   CO2 26 09/07/2020 1120   BUN 14 09/07/2020 1120   BUN 18 06/02/2018 1514   CREATININE 0.80 09/07/2020 1120      Component Value Date/Time   CALCIUM 9.9 09/07/2020 1120   ALKPHOS 68 09/07/2020 1120   AST 15 09/07/2020 1120   ALT 4 09/07/2020 1120   BILITOT 0.6 09/07/2020 1120     Lab Results  Component Value Date   WBC 5.8 09/07/2020   HGB 13.9 09/07/2020   HCT 42.1 09/07/2020   MCV 93.8 09/07/2020   PLT 142.0 (L) 09/07/2020   Lab Results  Component Value Date   TSH 1.32 04/21/2019   Lab Results  Component Value Date   CHOL 155 12/01/2019   HDL 66.20 12/01/2019   LDLCALC 65 12/01/2019   LDLDIRECT 87.5 09/07/2012   TRIG 118.0 12/01/2019   CHOLHDL 2 12/01/2019   IMPRESSION AND PLAN:  No problem-specific Assessment & Plan notes found for this encounter.   An After Visit Summary was printed and given to the patient.  FOLLOW UP: No follow-ups on file. Next "routine" f/u 5-6 mo.  Signed:  Crissie Sickles, MD           10/12/2020

## 2020-10-17 ENCOUNTER — Telehealth: Payer: Self-pay | Admitting: Family Medicine

## 2020-10-17 NOTE — Telephone Encounter (Signed)
Patient declined visit.

## 2020-10-19 NOTE — Progress Notes (Signed)
Assessment/Plan:   1.  Parkinsons Disease  -Continue carbidopa/levodopa 25/100, 1.5  tablet 3 times per day.  -exercise!!  -We discussed that it used to be thought that levodopa would increase risk of melanoma but now it is believed that Parkinsons itself likely increases risk of melanoma. she is to get regular skin checks.  She went to dermatology in the last month.   Subjective:   Cassie White was seen today in follow up for Parkinsons disease.  My previous records were reviewed prior to todays visit as well as outside records available to me.  The patient with daughter who supplements the hx.   Pt fell in [place] several months ago - didn't get hurt.  Pt denies lightheadedness, near syncope.  No hallucinations.  Mood has been fair.  She is not exercising.    Last saw primary care February 4.  Notes are reviewed.  Current prescribed movement disorder medications: Carbidopa/levodopa 25/100, 1 tablet 3 times per day (pt taking 1.5 tabs tid)   PREVIOUS MEDICATIONS: Sinemet  ALLERGIES:   Allergies  Allergen Reactions  . Sulfa Antibiotics Swelling    face  . Tizanidine Other (See Comments)    delirium    CURRENT MEDICATIONS:  Outpatient Encounter Medications as of 10/23/2020  Medication Sig  . Biotin 5000 MCG CAPS Take 5,000 mcg by mouth daily.  . Calcium Carbonate (CALCIUM 600 PO) Take 600 mg by mouth 2 (two) times daily.   . carbidopa-levodopa (SINEMET IR) 25-100 MG tablet TAKE 1 TABLET BY MOUTH THREE TIMES A DAY  . cyanocobalamin (,VITAMIN B-12,) 1000 MCG/ML injection INJECT 1 ML (1,000 MCG TOTAL) INTO THE MUSCLE EVERY 30 (THIRTY) DAYS.  Marland Kitchen ELIQUIS 5 MG TABS tablet TAKE 1 TABLET BY MOUTH TWICE A DAY  . flecainide (TAMBOCOR) 100 MG tablet TAKE 1 TABLET BY MOUTH TWICE A DAY  . gabapentin (NEURONTIN) 300 MG capsule TAKE 1 CAPSULE BY MOUTH AT LUNCH, 1 AT DINNER, AND 2 AT BEDTIME  . metoprolol tartrate (LOPRESSOR) 25 MG tablet TAKE 1 TABLET BY MOUTH TWICE A DAY  . nitroGLYCERIN  (NITROSTAT) 0.4 MG SL tablet Place 1 tablet (0.4 mg total) under the tongue every 5 (five) minutes as needed for chest pain.  . Omega-3 Fatty Acids (FISH OIL) 1200 MG CAPS Take 1,000 mg by mouth 2 (two) times daily.   . pravastatin (PRAVACHOL) 40 MG tablet TAKE 1 TABLET BY MOUTH EVERY DAY  . [DISCONTINUED] pantoprazole (PROTONIX) 40 MG tablet TAKE 1 TABLET BY MOUTH EVERY DAY (Patient not taking: Reported on 10/23/2020)   No facility-administered encounter medications on file as of 10/23/2020.    Objective:   PHYSICAL EXAMINATION:    VITALS:   Vitals:   10/23/20 1032  BP: 118/62  Pulse: (!) 52  SpO2: 95%  Weight: 154 lb (69.9 kg)  Height: 5\' 1"  (1.549 m)    GEN:  The patient appears stated age and is in NAD. HEENT:  Normocephalic, atraumatic.  The mucous membranes are moist. The superficial temporal arteries are without ropiness or tenderness. CV:  Loletha Grayer.  regular Lungs:  CTAB Neck/HEME:  There are no carotid bruits bilaterally.  Neurological examination:  Orientation: The patient is alert and oriented x3. Cranial nerves: There is good facial symmetry with min facial hypomimia. The speech is fluent and clear. Soft palate rises symmetrically and there is no tongue deviation. Hearing is intact to conversational tone. Sensation: Sensation is intact to light touch throughout Motor: Strength is at least antigravity x4.  Movement examination: Tone: There is nl tone in the UE/LE Abnormal movements: none Coordination:  There is no decremation with RAM's, with any form of RAMS, including alternating supination and pronation of the forearm, hand opening and closing, finger taps, heel taps and toe taps. Gait and Station: The patient has min difficulty arising out of a deep-seated chair without the use of the hands. The patient is antalgic without the walker (knee and hip issues).  With the walker, she walks well.  I have reviewed and interpreted the following labs independently     Chemistry      Component Value Date/Time   NA 139 09/07/2020 1120   NA 145 (H) 06/02/2018 1514   K 4.4 09/07/2020 1120   CL 107 09/07/2020 1120   CO2 26 09/07/2020 1120   BUN 14 09/07/2020 1120   BUN 18 06/02/2018 1514   CREATININE 0.80 09/07/2020 1120      Component Value Date/Time   CALCIUM 9.9 09/07/2020 1120   ALKPHOS 68 09/07/2020 1120   AST 15 09/07/2020 1120   ALT 4 09/07/2020 1120   BILITOT 0.6 09/07/2020 1120       Lab Results  Component Value Date   WBC 5.8 09/07/2020   HGB 13.9 09/07/2020   HCT 42.1 09/07/2020   MCV 93.8 09/07/2020   PLT 142.0 (L) 09/07/2020    Lab Results  Component Value Date   TSH 1.32 04/21/2019     Total time spent on today's visit was 20 minutes, including both face-to-face time and nonface-to-face time.  Time included that spent on review of records (prior notes available to me/labs/imaging if pertinent), discussing treatment and goals, answering patient's questions and coordinating care.  Cc:  Tammi Sou, MD

## 2020-10-23 ENCOUNTER — Other Ambulatory Visit: Payer: Self-pay

## 2020-10-23 ENCOUNTER — Ambulatory Visit (INDEPENDENT_AMBULATORY_CARE_PROVIDER_SITE_OTHER): Payer: Medicare Other | Admitting: Neurology

## 2020-10-23 ENCOUNTER — Encounter: Payer: Self-pay | Admitting: Neurology

## 2020-10-23 VITALS — BP 118/62 | HR 52 | Ht 61.0 in | Wt 154.0 lb

## 2020-10-23 DIAGNOSIS — G20 Parkinson's disease: Secondary | ICD-10-CM

## 2020-10-23 MED ORDER — CARBIDOPA-LEVODOPA 25-100 MG PO TABS: 1.5000 | ORAL_TABLET | Freq: Three times a day (TID) | ORAL | 1 refills | Status: DC

## 2020-10-23 NOTE — Patient Instructions (Signed)
Increase your exercise!  Online Resources for Power over Parkinson's Group March 2022  . Local [place] Online Groups  o Power over [employer] Group :   - Power Over Parkinson's Patient Education Group will be Wednesday, March 9th at 2pm via Zoom.   - Upcoming Power over Parkinson's Meetings:  2nd Wednesdays of the month at 2 pm:       April 13th, May 11th - Contact Amy Marriott at [email or web address] if interested in participating in this online group o Parkinson's Care Partners Group:    3rd Mondays, Contact Corwin Levins o Atypical Parkinsonian Patient Group:   4th Wednesdays, Contact Corwin Levins o If you are interested in participating in these online groups with Judson Roch, please contact her directly for how to join those meetings.  Her contact information is [email or web address].  She will send you a link to join the [employer].  (Please note that Corwin Levins , MSW, LCSW, has resigned her position at [facility] [employer] [facility], but will continue to lead the online groups temporarily)  . [place]:  [email or web address] o PD Health at Home continues:  Mindfulness Mondays, Expert Briefing Tuesdays, Wellness Wednesdays, Take Time Thursdays, Fitness Fridays -Listings for March 2022 are on the website o Upcoming Webinar:  Conversations about Complementary Therapies and PD.  Wednesday, March 2nd @ 1 pm o Upcoming Webinar:  Can We Put the Brakes on PD Progression?  Wednesday, April 6th @ 1 pm o Geneticist, molecular) at [email or web address] o  Please check out their website to sign up for emails and see their full online offerings  . [place]:  [email or web address]  o Upcoming Webinar:   Trouble Sleeping?  What to Know about Acting out Dreams and other Sleep Issues.  Thursday, March 17th @ 12 noon o Check out additional information on their website to see their full online offerings  . [place]:   [email or web address] o Upcoming Webinar:  Women and Parkinson's.  Tuesday, March 8th at 2 pm o Care Partner Monthly Meetup.  With 06-28-1993 [place].  First Tuesday of each month, 2 pm o Check out additional information to Live Well Today on their website  . Parkinson and Movement Disorders (PMD) Alliance:  www.pmdalliance.org o [employer]:  Online Education Events o Sign up for emails, which are sent weekly to give you updates on programming and online offerings   . Parkinson's Association of the [place]:  www.parkinsonassociation.org o Information on online support groups, education events, and online exercises including Yoga, Parkinson's exercises and more-LOTS of information on links to PD resources and online events o Virtual Support Group through Parkinson's Association of the [place]; next one is scheduled for Wednesday, November 07, 2020 at 2 pm. (These are typically scheduled for the 1st Wednesday of the month at 2 pm).  Visit website for details.  . Additional links for movement activities: o PWR! Moves Classes at [place] RESUMED!  Wednesdays 10 and 11 am.  Contact Amy Marriott, PT [email or web address] or [phone] if interested o Here is a link to the PWR!Moves classes on Zoom from [phone] - Daily Mon-Sat at 10:00. Via Zoom, FREE and open to all.  There is also a link below via Facebook if you use that platform. - [place] - [email or web address] o Parkinson's Wellness Recovery (PWR! Moves)  www.[email or web address] - Info on the PWR! Virtual Experience:  You will have access to our expertise through self-assessment, guided plans that start with the PD-specific fundamentals, educational content, tips,  Q&A with an expert, and a  Armed forces operational officer of PD-specific pre-recorded and live exercise classes of varying types and intensity - both physical and cognitive! If that is not enough, we offer 1:1 wellness consultations (in-person or virtual) to personalize your PWR! Research scientist (medical).  - Check out the PWR! Move of the month on the [place] Recovery website:  [email or web address] o [employer] Fridays:  - As part of the PD Health @ Home program, this free video series focuses each week on one aspect of fitness designed to support people living with Parkinson's.  These weekly videos highlight the [place] recent fitness guidelines for people with Parkinson's disease. -  [email or web address].dk o Dance for PD website is offering free, live-stream classes throughout the week, as well as links to [employer] of classes:  [email or web address] o Dance for Parkinson's Class:  [place].  Free offering for people with Parkinson's and care partners; virtual class.  o For more information, contact [phone] or email Ruffin Frederick at [email or web address] o Virtual dance and Pilates for Parkinson's classes: Click on the Community Tab> Parkinson's Movement Initiative Tab.  To register for classes and for more information, visit www.[place] and click the "community" tab.     o [employer] Parkinson's Cycling Classes  - [employer]: 1pm on Fridays-Live classes at [facility] at [email or web address] or [phone]) [employer]: Virtual Classes Mondays and Thursdays (contact [place] at [email or web address] or [phone])   o [address] [phone] - Three levels of classes are offered Tuesdays and Thursdays:  10:30 am,  12 noon & 1:45 pm at [facility].  - Active Stretching with  09-18-1985 Class starting in March, on Fridays - To observe a class or for  more information, call [phone] or email [email or web address] . [employer]: o 10-04-1982 Opportunities:  www.well-springsolutions.org/caregiver-education/caregiver-support-group.  You may also contact [phone] at [email or web address] or [phone].   o Virtual Caregiver Retreat, Monday, February 21st, 3-5 pm o Powerful Tools for Caregivers, 6-wk series for caregivers, in-person, Thursdays March 10, 17, 24, 31 and April 7, 14, from 10:30-12:30 at March 06, [place].  Contact 06-20-1972 (see above) to register o [employer] Navigator:  Just1Navigator program, a free service to help individuals and families through the journey of determining care for older adults.  The "Navigator" is a [employer], [address], who will speak with a prospective client and/or loved ones to provide an assessment of the situation and a set of recommendations for a personalized care plan - all free of charge, and whether [facility] offers the needed service or not. If the need is not a service we provide, we are well-connected with reputable programs in town that we can refer you to.  www.well-springsolutions.org or to speak with the Navigator, call [phone].

## 2020-10-31 ENCOUNTER — Ambulatory Visit: Payer: Medicare Other | Admitting: Family Medicine

## 2020-11-28 ENCOUNTER — Other Ambulatory Visit: Payer: Self-pay | Admitting: Family Medicine

## 2020-11-28 DIAGNOSIS — I4891 Unspecified atrial fibrillation: Secondary | ICD-10-CM

## 2020-12-10 ENCOUNTER — Encounter: Payer: Self-pay | Admitting: Family Medicine

## 2020-12-10 DIAGNOSIS — N8111 Cystocele, midline: Secondary | ICD-10-CM | POA: Diagnosis not present

## 2020-12-10 DIAGNOSIS — Z01419 Encounter for gynecological examination (general) (routine) without abnormal findings: Secondary | ICD-10-CM | POA: Diagnosis not present

## 2020-12-10 DIAGNOSIS — Z6828 Body mass index (BMI) 28.0-28.9, adult: Secondary | ICD-10-CM | POA: Diagnosis not present

## 2020-12-26 ENCOUNTER — Other Ambulatory Visit: Payer: Self-pay | Admitting: Family Medicine

## 2020-12-26 DIAGNOSIS — I4891 Unspecified atrial fibrillation: Secondary | ICD-10-CM

## 2020-12-27 ENCOUNTER — Other Ambulatory Visit: Payer: Self-pay | Admitting: Family Medicine

## 2021-02-06 ENCOUNTER — Other Ambulatory Visit: Payer: Self-pay | Admitting: Family Medicine

## 2021-02-06 ENCOUNTER — Other Ambulatory Visit: Payer: Self-pay | Admitting: Medical

## 2021-02-06 DIAGNOSIS — I4891 Unspecified atrial fibrillation: Secondary | ICD-10-CM

## 2021-02-07 ENCOUNTER — Telehealth: Payer: Self-pay

## 2021-02-07 DIAGNOSIS — I4891 Unspecified atrial fibrillation: Secondary | ICD-10-CM

## 2021-02-07 MED ORDER — METOPROLOL TARTRATE 25 MG PO TABS: 25.0000 mg | ORAL_TABLET | Freq: Two times a day (BID) | ORAL | 0 refills | Status: DC

## 2021-02-07 NOTE — Telephone Encounter (Signed)
Tried calling patient, unable to LVM. Rx sent for 30 day supply

## 2021-02-07 NOTE — Telephone Encounter (Signed)
Patient needs refill on blood pressure meds. Appt is scheduled for 02/26/21 with Dr. Anitra Lauth   metoprolol tartrate (LOPRESSOR) 25 MG tablet [[id number]]    [id number] - [place], [place] [address]

## 2021-02-07 NOTE — Telephone Encounter (Signed)
Pt made aware refill sent. 

## 2021-02-20 ENCOUNTER — Other Ambulatory Visit: Payer: Self-pay | Admitting: Cardiology

## 2021-02-20 NOTE — Telephone Encounter (Signed)
Prescription refill request for Eliquis received. Indication:atrial fib Last office visit:12/21 Scr:0.8 Age: 80 Weight:69.9 kg  Prescription refilled

## 2021-02-26 ENCOUNTER — Ambulatory Visit (INDEPENDENT_AMBULATORY_CARE_PROVIDER_SITE_OTHER): Payer: Medicare Other | Admitting: Family Medicine

## 2021-02-26 ENCOUNTER — Encounter: Payer: Self-pay | Admitting: Family Medicine

## 2021-02-26 ENCOUNTER — Other Ambulatory Visit: Payer: Self-pay

## 2021-02-26 VITALS — BP 106/64 | HR 52 | Temp 98.0°F | Resp 16 | Ht 61.0 in | Wt 151.4 lb

## 2021-02-26 DIAGNOSIS — Z7901 Long term (current) use of anticoagulants: Secondary | ICD-10-CM | POA: Diagnosis not present

## 2021-02-26 DIAGNOSIS — I10 Essential (primary) hypertension: Secondary | ICD-10-CM

## 2021-02-26 DIAGNOSIS — K219 Gastro-esophageal reflux disease without esophagitis: Secondary | ICD-10-CM

## 2021-02-26 DIAGNOSIS — R0789 Other chest pain: Secondary | ICD-10-CM | POA: Diagnosis not present

## 2021-02-26 DIAGNOSIS — G609 Hereditary and idiopathic neuropathy, unspecified: Secondary | ICD-10-CM

## 2021-02-26 DIAGNOSIS — E7800 Pure hypercholesterolemia, unspecified: Secondary | ICD-10-CM

## 2021-02-26 DIAGNOSIS — I4891 Unspecified atrial fibrillation: Secondary | ICD-10-CM

## 2021-02-26 LAB — CBC WITH DIFFERENTIAL/PLATELET
Basophils Absolute: 0 10*3/uL (ref 0.0–0.1)
Basophils Relative: 0.7 % (ref 0.0–3.0)
Eosinophils Absolute: 0 10*3/uL (ref 0.0–0.7)
Eosinophils Relative: 0.8 % (ref 0.0–5.0)
HCT: 40.5 % (ref 36.0–46.0)
Hemoglobin: 13.4 g/dL (ref 12.0–15.0)
Lymphocytes Relative: 31.6 % (ref 12.0–46.0)
Lymphs Abs: 1.8 10*3/uL (ref 0.7–4.0)
MCHC: 33.2 g/dL (ref 30.0–36.0)
MCV: 93.7 fl (ref 78.0–100.0)
Monocytes Absolute: 0.7 10*3/uL (ref 0.1–1.0)
Monocytes Relative: 12.7 % — ABNORMAL HIGH (ref 3.0–12.0)
Neutro Abs: 3.1 10*3/uL (ref 1.4–7.7)
Neutrophils Relative %: 54.2 % (ref 43.0–77.0)
Platelets: 157 10*3/uL (ref 150.0–400.0)
RBC: 4.33 Mil/uL (ref 3.87–5.11)
RDW: 13.5 % (ref 11.5–15.5)
WBC: 5.7 10*3/uL (ref 4.0–10.5)

## 2021-02-26 LAB — COMPREHENSIVE METABOLIC PANEL
ALT: 6 U/L (ref 0–35)
AST: 20 U/L (ref 0–37)
Albumin: 4.2 g/dL (ref 3.5–5.2)
Alkaline Phosphatase: 64 U/L (ref 39–117)
BUN: 19 mg/dL (ref 6–23)
CO2: 27 mEq/L (ref 19–32)
Calcium: 10.2 mg/dL (ref 8.4–10.5)
Chloride: 104 mEq/L (ref 96–112)
Creatinine, Ser: 0.81 mg/dL (ref 0.40–1.20)
GFR: 68.82 mL/min (ref >60.00)
Glucose, Bld: 82 mg/dL (ref 70–99)
Potassium: 4.5 mEq/L (ref 3.5–5.1)
Sodium: 139 mEq/L (ref 135–145)
Total Bilirubin: 0.8 mg/dL (ref 0.2–1.2)
Total Protein: 6.5 g/dL (ref 6.0–8.3)

## 2021-02-26 LAB — LIPID PANEL
Cholesterol: 194 mg/dL (ref 0–200)
HDL: 66.9 mg/dL (ref >39.00)
LDL Cholesterol: 102 mg/dL — ABNORMAL HIGH (ref 0–99)
NonHDL: 127.18
Total CHOL/HDL Ratio: 3
Triglycerides: 128 mg/dL (ref 0.0–149.0)
VLDL: 25.6 mg/dL (ref 0.0–40.0)

## 2021-02-26 MED ORDER — GABAPENTIN 300 MG PO CAPS: ORAL_CAPSULE | ORAL | 3 refills | Status: DC

## 2021-02-26 MED ORDER — METOPROLOL TARTRATE 25 MG PO TABS: 25.0000 mg | ORAL_TABLET | Freq: Two times a day (BID) | ORAL | 3 refills | Status: DC

## 2021-02-26 MED ORDER — NITROGLYCERIN 0.4 MG SL SUBL: 0.4000 mg | SUBLINGUAL_TABLET | SUBLINGUAL | 1 refills | Status: DC | PRN

## 2021-02-26 MED ORDER — ZOSTER VAC RECOMB ADJUVANTED 50 MCG/0.5ML IM SUSR: 0.5000 mL | Freq: Once | INTRAMUSCULAR | 1 refills | Status: AC

## 2021-02-26 NOTE — Progress Notes (Signed)
OFFICE VISIT  02/26/2021  CC:  Chief Complaint  Patient presents with   Follow-up    RCI, pt is fasting    HPI:    Patient is a 80 y.o. Caucasian female who presents accompanied by her daughter Butch Penny for 6 mo f/u HTN, HLD, PAF, GERD. A/P as of last visit: "1) HTN: stable. Cont lopressor '25mg'$  bid. Lytes/cr today.   2) HLD: tolerating pravachol '40mg'$  qd. Last lipids 9 mo ago EXCELLENT. She's not fasting today. Plan recheck FLP 6 mo. Hepatic panel checked today.   3) PAF: regular rhythm.  Asymptomatic. Followed by cardiology. Cont flecainide, lopressor, and eliquis. No sign of bleeding. CBC monitoring today.   4) GERD: start pantoprazole '40mg'$  qd, adjust diet as appropriate for this problem.  F/u 4-6 wks and if doing well then will discuss cutting back to prn use vs switchover to H2 blocker.   5) Parkinson's dz: stable. Followed by Dr. Carles Collet. Cont sinemet."  INTERIM HX: Labs all normal last visit. Feeling well, no acute complaints. She uses walker at all times, ambulation limited d/t chronic back and hips pain.    She never started the pantoprazole I rx'd last visit, says stomach feels fine and has no GER sx's. No home bp monitoring but compliant with metoprolol. Denies palpitations, racing heartbeat, dizziness, CP, nausea, or SOB. Denies melena, hematochezia, nose bleeds, or excessive bruising. Compliant with pravastatin 40 qd. Takes gabapentin '600mg'$  bid, says some mild burning in LL's/feet usually felt when goes to bed but tolerable.  ROS as above, plus--> no fevers, no wheezing, no cough, no HAs, no rashes.  No polyuria or polydipsia.  No myalgias or arthralgias.  No focal weakness. No acute vision or hearing abnormalities.  No dysuria or unusual/new urinary urgency or frequency.  No recent changes in lower legs. No n/v/d or abd pain.  Past Medical History:  Diagnosis Date   Abnormal mammogram of left breast    Likely benign microcalcifications--repeat L diag mammo  03/24/2017.  COMPLEX SCLEROSING LESION WITH CALCIFICATIONS --no sign of malignancy.  Excision recommended--pt has been referred to Dr. Donne Hazel, who recommended f/u mammo and this was normal 12/2017--consider rpt 1 yr per rad.   Arthritis    DJD, back and both hips   Chronic low back pain without sciatica    Lumbar spondylosis + scoliosis.  Summer 2017 Dr. Nelva Bush did ESI and pt got no relief.  Pt then saw Dr. Maia Petties [facility]: felt she had facet mediated pain; plan for B L345 MBB (??).   Cystocele, midline    Diverticulosis    on colonoscopy 2004   GERD (gastroesophageal reflux disease)    Hx of cardiovascular stress test    Lexiscan Myoview (2/14):  Low risk, small ant defect likely breast attenuation, no ischemia, EF 69% (no change from 2010).     Hyperlipidemia    Hypertension    per pt   Idiopathic scoliosis of thoracolumbar region    Lumbar back pain    Mitral regurgitation    Echo (2/14):  EF 60-65%, Gr 1 DD, mild AI, mild bileaflet MVP, mod post directed MR, mild LAE, PASP 35.   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  Responding to gabapentin (Dr. Brett Fairy).   Osteoporosis, senile    stable left hip 2015 (took fosamax x 71yr, T-score -2.7 when she stopped bisphosphonate).  Rpt DEXA 223moater (09/2016) T-score -.3.1 (different machine, though). 11/2019  DEXA T-score -4. Recommended restart of fosamax   PAF (paroxysmal atrial fibrillation) (HCC)    Dr. [place]: Flecainide, metopr, apxiaban.  08/2017-->persistent a-fib w/RVR, flecainide increased and she converted back to NSR and felt better.  '100mg'$  bid flecainide continued.   Parkinson's disease ([place]) 2020 dx   started sinemet 05/2019 (Dr. Carles Collet)   Peripheral neuropathy 2008   Idiopathic vs familial (motor>sensory) vs lumbar disc dz: gabapentin helpful   Shoulder pain left   RC surg 09/2014    Past Surgical History:  Procedure Laterality  Date   BREAST BIOPSY  04/10/2017   Left breast core needle biopsy of calcifications.  COMPLEX SCLEROSING LESION WITH CALCIFICATIONS --no sign of malignancy.  Excision recommended--pt has been referred to Dr. Donne Hazel, who recommended rpt mammo and this was NORMAL 12/2017.   BREAST EXCISIONAL BIOPSY Right 2010   Benign   BREAST SURGERY  Nov 2010   Benign biopsy, right   CHOLECYSTECTOMY  03/2010   Dr.Gross   COLONOSCOPY  10/03/2002   No polyps.  Repeat 10 yrs recommended but pt declines.  Pt declined cologuard 08/2016 but changed her mind and this test was NEG on 03/23/17.   COMBINED HYSTERECTOMY VAGINAL / OOPHORECTOMY / A&P REPAIR  0000000   uncertain if ovaries were removed or not   CYSTOCELE REPAIR     DEXA  11/2019   T score -4   Lumpectomy  06/2009   "Fatty Necrosis"   PFT's  2014   NORMAL   REVERSE SHOULDER ARTHROPLASTY Right 03/11/2019   Procedure: REVERSE TOTAL SHOULDER ARTHROPLASTY;  Surgeon: Netta Cedars, MD;  Location: [facility];  Service: Orthopedics;  Laterality: Right;  Intersclene block   ROTATOR CUFF REPAIR Left 10/02/14   Dr. Veverly Fells   TONSILLECTOMY     TOTAL KNEE ARTHROPLASTY  09/2009   Right ;Dr Alvan Dame   TOTAL KNEE ARTHROPLASTY Left 03/10/2016   Procedure: TOTAL KNEE ARTHROPLASTY;  Surgeon: Paralee Cancel, MD;  Location: [facility];  Service: Orthopedics;  Laterality: Left;   TRANSTHORACIC ECHOCARDIOGRAM  09/2012; 09/27/15   2014: EF 60-65%, Gr 1 DD, mild AI, mild bileaflet MVP, mod post directed MR, mild LAE, PASP 35.  Repeat 2017: EF 55-60%, mild AR, mild MVP and mild MV regurg.   VAGINAL HYSTERECTOMY     For Uterine Deviation     Outpatient Medications Prior to Visit  Medication Sig Dispense Refill   Biotin 5000 MCG CAPS Take 5,000 mcg by mouth daily.     Calcium Carbonate (CALCIUM 600 PO) Take 600 mg by mouth 2 (two) times daily.      carbidopa-levodopa (SINEMET IR) 25-100 MG tablet Take 1.5 tablets by mouth 3 (three) times daily. 405 tablet 1   cyanocobalamin (,VITAMIN B-12,)  1000 MCG/ML injection INJECT 1 ML (1,000 MCG TOTAL) INTO THE MUSCLE EVERY 30 (THIRTY) DAYS. 10 mL 0   ELIQUIS 5 MG TABS tablet TAKE 1 TABLET BY MOUTH TWICE A DAY 60 tablet 5   flecainide (TAMBOCOR) 100 MG tablet TAKE 1 TABLET BY MOUTH TWICE A DAY 180 tablet 1   gabapentin (NEURONTIN) 300 MG capsule TAKE 1 CAPSULE BY MOUTH AT LUNCH, 1 AT DINNER, AND 2 AT BEDTIME 360 capsule 3   metoprolol tartrate (LOPRESSOR) 25 MG tablet Take 1 tablet (25 mg total) by mouth 2 (two) times daily. 60 tablet 0   Omega-3 Fatty Acids (FISH OIL) 1200 MG CAPS Take 1,000 mg by mouth 2 (two) times daily.      pravastatin (PRAVACHOL) 40 MG tablet TAKE 1  TABLET BY MOUTH EVERY DAY 90 tablet 3   nitroGLYCERIN (NITROSTAT) 0.4 MG SL tablet Place 1 tablet (0.4 mg total) under the tongue every 5 (five) minutes as needed for chest pain. (Patient not taking: Reported on 02/26/2021) 25 tablet 5   No facility-administered medications prior to visit.    Allergies  Allergen Reactions   Sulfa Antibiotics Swelling    face   Tizanidine Other (See Comments)    delirium    ROS As per HPI  PE: Vitals with BMI 02/26/2021 10/23/2020 09/07/2020  Height '5\' 1"'$  '5\' 1"'$  '5\' 1"'$   Weight 151 lbs 6 oz 154 lbs 155 lbs  BMI 28.62 A999333 A999333  Systolic A999333 [id number]  Diastolic 64 62 68  Pulse 52 52 54   Gen: Alert, well appearing.  Patient is oriented to person, place, time, and situation. AFFECT: pleasant, lucid thought and speech. CV: Regular rhythm, brady to 50s, no m/r/g.   LUNGS: CTA bilat, nonlabored resps, good aeration in all lung fields. Abd: soft, NT/ND EXT: no clubbing or cyanosis.  no edema.    LABS:  Lab Results  Component Value Date   TSH 1.32 04/21/2019   Lab Results  Component Value Date   WBC 5.8 09/07/2020   HGB 13.9 09/07/2020   HCT 42.1 09/07/2020   MCV 93.8 09/07/2020   PLT 142.0 (L) 09/07/2020   Lab Results  Component Value Date   CREATININE 0.80 09/07/2020   BUN 14 09/07/2020   NA 139 09/07/2020   K 4.4  09/07/2020   CL 107 09/07/2020   CO2 26 09/07/2020   Lab Results  Component Value Date   ALT 4 09/07/2020   AST 15 09/07/2020   ALKPHOS 68 09/07/2020   BILITOT 0.6 09/07/2020   Lab Results  Component Value Date   CHOL 155 12/01/2019   Lab Results  Component Value Date   HDL 66.20 12/01/2019   Lab Results  Component Value Date   LDLCALC 65 12/01/2019   Lab Results  Component Value Date   TRIG 118.0 12/01/2019   Lab Results  Component Value Date   CHOLHDL 2 12/01/2019   Lab Results  Component Value Date   HGBA1C  09/19/2010    5.3 (NOTE)                                                                       According to the ADA Clinical Practice Recommendations for 2011, when HbA1c is used as a screening test:   >=6.5%   Diagnostic of Diabetes Mellitus           (if abnormal result  is confirmed)  5.7-6.4%   Increased risk of developing Diabetes Mellitus  References:Diagnosis and Classification of Diabetes Mellitus,Diabetes [id number] 1):S62-S69 and Standards of Medical Care in         Diabetes - 2011,Diabetes Care,2011,34  (Suppl 1):S11-S61.   IMPRESSION AND PLAN:  1) HTN: well controlled on lopressor 25 bid. Lytes/cr today.  2) HLD: tolerating pravastatin 40 qd. LDL 65 fifteen months ago. FLP and hepatic panel today.  3) PAF: regular rhythm on flecainide, lopressor, and eliquis per cardiology. CBC and lytes/cr today.  4) Periph neuropathy (see PMH): sx's well controlled on gabapentin 600 mg bid--RFd today.  5) GERD: sx's ended up spontaneously resolved (pantop not used) after last visit.  6) Preventative health care: Immunizations: shingrix rx sent to pharmacy. She declines any further Tdap. Otherwise vaccines are UTD. Osteoporosis screening: she actually has had osteoporosis, I got her back on fosamax 11/2019 but I'm not sure if she ever took it.  Didn't discuss today, will discuss at next f/u visit. Breast ca screening: mammogram due 03/2021. Colon  cancer screening: not discussed today.  Will discuss possible rpt cologuard vs switching to iFOB at next o/v. If she gets one of these and it's negative then no further colon ca screening indicated.  An After Visit Summary was printed and given to the patient.  FOLLOW UP: No follow-ups on file.  Signed:  Crissie Sickles, MD           02/26/2021

## 2021-03-07 ENCOUNTER — Encounter: Payer: Self-pay | Admitting: Family Medicine

## 2021-03-07 ENCOUNTER — Telehealth (INDEPENDENT_AMBULATORY_CARE_PROVIDER_SITE_OTHER): Payer: Medicare Other | Admitting: Family Medicine

## 2021-03-07 VITALS — Ht 61.0 in

## 2021-03-07 DIAGNOSIS — Z20822 Contact with and (suspected) exposure to covid-19: Secondary | ICD-10-CM | POA: Diagnosis not present

## 2021-03-07 DIAGNOSIS — B349 Viral infection, unspecified: Secondary | ICD-10-CM | POA: Diagnosis not present

## 2021-03-07 DIAGNOSIS — J069 Acute upper respiratory infection, unspecified: Secondary | ICD-10-CM

## 2021-03-07 MED ORDER — MOLNUPIRAVIR EUA 200MG CAPSULE: 4.0000 | ORAL_CAPSULE | Freq: Two times a day (BID) | ORAL | 0 refills | Status: AC

## 2021-03-07 NOTE — Progress Notes (Signed)
Virtual Visit via Video Note  I connected with pt on 03/07/21 at  4:15 PM EDT by a video enabled telemedicine application and verified that I am speaking with the correct person using two identifiers.  Location patient: home, [place] Location provider:work or home office Persons participating in the virtual visit: patient, provider  I discussed the limitations of evaluation and management by telemedicine and the availability of in person appointments. The patient expressed understanding and agreed to proceed.  Telemedicine visit is a necessity given the COVID-19 restrictions in place at the current time.  HPI: 80 y/o WF being seen today accompanied by her daughter for "sinus congestion, 3 others in house with covid". Runny nose/nasal congestion + malaise onset over the last 24h, a little cough.  Some HA but no ST. Tylenol helped HA.  No fever.  No sob/cp/wheeze or dizziness.  Eating and drinking fine.  No n/v/d. 3 family members that live with her currently have covid + resp illness. Pt has not been tested for covid.  ROS: See pertinent positives and negatives per HPI.  Past Medical History:  Diagnosis Date   Abnormal mammogram of left breast    Likely benign microcalcifications--repeat L diag mammo 03/24/2017.  COMPLEX SCLEROSING LESION WITH CALCIFICATIONS --no sign of malignancy.  Excision recommended--pt has been referred to Dr. Donne Hazel, who recommended f/u mammo and this was normal 12/2017--consider rpt 1 yr per rad.   Arthritis    DJD, back and both hips   Chronic low back pain without sciatica    Lumbar spondylosis + scoliosis.  Summer 2017 Dr. Nelva Bush did ESI and pt got no relief.  Pt then saw Dr. Maia Petties [facility]: felt she had facet mediated pain; plan for B L345 MBB (??).   Cystocele, midline    Diverticulosis    on colonoscopy 2004   GERD (gastroesophageal reflux disease)    Hx of cardiovascular stress test    Lexiscan Myoview (2/14):  Low risk, small ant defect  likely breast attenuation, no ischemia, EF 69% (no change from 2010).     Hyperlipidemia    Hypertension    per pt   Idiopathic scoliosis of thoracolumbar region    Lumbar back pain    Mitral regurgitation    Echo (2/14):  EF 60-65%, Gr 1 DD, mild AI, mild bileaflet MVP, mod post directed MR, mild LAE, PASP 35.   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  Responding to gabapentin (Dr. Brett Fairy).   Osteoporosis, senile    stable left hip 2015 (took fosamax x 47yr, T-score -2.7 when she stopped bisphosphonate).  Rpt DEXA 287moater (09/2016) T-score -.3.1 (different machine, though). 11/2019 DEXA T-score -4. Recommended restart of fosamax   PAF (paroxysmal atrial fibrillation) (HCC)    Dr. JoMartiniqueFlecainide, metopr, apxiaban.  08/2017-->persistent a-fib w/RVR, flecainide increased and she converted back to NSR and felt better.  '100mg'$  bid flecainide continued.   [place]'s [place] dx   started sinemet 05/2019 (Dr. TaCarles Collet  Peripheral neuropathy 2008   Idiopathic vs familial (motor>sensory) vs lumbar disc dz: gabapentin helpful   Shoulder pain left   RC surg 09/2014    Past Surgical History:  Procedure Laterality Date   BREAST BIOPSY  04/10/2017   Left breast core needle biopsy of calcifications.  COMPLEX SCLEROSING LESION WITH CALCIFICATIONS --no sign of malignancy.  Excision recommended--pt has been referred to Dr. WaDonne Hazelwho recommended rpt mammo and  this was NORMAL 12/2017.   BREAST EXCISIONAL BIOPSY Right 2010   Benign   BREAST SURGERY  Nov 2010   Benign biopsy, right   CHOLECYSTECTOMY  03/2010   Dr.Gross   COLONOSCOPY  10/03/2002   No polyps.  Repeat 10 yrs recommended but pt declines.  Pt declined cologuard 08/2016 but changed her mind and this test was NEG on 03/23/17.   COMBINED HYSTERECTOMY VAGINAL / OOPHORECTOMY / A&P REPAIR  0000000   uncertain if ovaries were removed or not   CYSTOCELE  REPAIR     DEXA  11/2019   T score -4   Lumpectomy  06/2009   "Fatty Necrosis"   PFT's  2014   NORMAL   REVERSE SHOULDER ARTHROPLASTY Right 03/11/2019   Procedure: REVERSE TOTAL SHOULDER ARTHROPLASTY;  Surgeon: Netta Cedars, MD;  Location: [facility];  Service: Orthopedics;  Laterality: Right;  Intersclene block   ROTATOR CUFF REPAIR Left 10/02/14   Dr. Veverly Fells   TONSILLECTOMY     TOTAL KNEE ARTHROPLASTY  09/2009   Right ;Dr Alvan Dame   TOTAL KNEE ARTHROPLASTY Left 03/10/2016   Procedure: TOTAL KNEE ARTHROPLASTY;  Surgeon: Paralee Cancel, MD;  Location: [facility];  Service: Orthopedics;  Laterality: Left;   TRANSTHORACIC ECHOCARDIOGRAM  09/2012; 09/27/15   2014: EF 60-65%, Gr 1 DD, mild AI, mild bileaflet MVP, mod post directed MR, mild LAE, PASP 35.  Repeat 2017: EF 55-60%, mild AR, mild MVP and mild MV regurg.   VAGINAL HYSTERECTOMY     For Uterine Deviation      Current Outpatient Medications:    Biotin 5000 MCG CAPS, Take 5,000 mcg by mouth daily., Disp: , Rfl:    Calcium Carbonate (CALCIUM 600 PO), Take 600 mg by mouth 2 (two) times daily. , Disp: , Rfl:    carbidopa-levodopa (SINEMET IR) 25-100 MG tablet, Take 1.5 tablets by mouth 3 (three) times daily., Disp: 405 tablet, Rfl: 1   cyanocobalamin (,VITAMIN B-12,) 1000 MCG/ML injection, INJECT 1 ML (1,000 MCG TOTAL) INTO THE MUSCLE EVERY 30 (THIRTY) DAYS., Disp: 10 mL, Rfl: 0   ELIQUIS 5 MG TABS tablet, TAKE 1 TABLET BY MOUTH TWICE A DAY, Disp: 60 tablet, Rfl: 5   flecainide (TAMBOCOR) 100 MG tablet, TAKE 1 TABLET BY MOUTH TWICE A DAY, Disp: 180 tablet, Rfl: 1   gabapentin (NEURONTIN) 300 MG capsule, 2 caps po bid, Disp: 360 capsule, Rfl: 3   metoprolol tartrate (LOPRESSOR) 25 MG tablet, Take 1 tablet (25 mg total) by mouth 2 (two) times daily., Disp: 180 tablet, Rfl: 3   nitroGLYCERIN (NITROSTAT) 0.4 MG SL tablet, Place 1 tablet (0.4 mg total) under the tongue every 5 (five) minutes as needed for chest pain., Disp: 15 tablet, Rfl: 1   Omega-3 Fatty  Acids (FISH OIL) 1200 MG CAPS, Take 1,000 mg by mouth 2 (two) times daily. , Disp: , Rfl:    pravastatin (PRAVACHOL) 40 MG tablet, TAKE 1 TABLET BY MOUTH EVERY DAY, Disp: 90 tablet, Rfl: 3  EXAM:  VITALS per patient if applicable:  Vitals with BMI 03/07/2021 02/26/2021 10/23/2020  Height '5\' 1"'$  '5\' 1"'$  '5\' 1"'$   Weight - 151 lbs 6 oz 154 lbs  BMI - 0000000 A999333  Systolic - A999333 [id number]  Diastolic - 64 62  Pulse - 52 52     GENERAL: alert, oriented, appears well and in no acute distress  HEENT: atraumatic, conjunttiva clear, no obvious abnormalities on inspection of external nose and ears  NECK: normal movements of the head  and neck  LUNGS: on inspection no signs of respiratory distress, breathing rate appears normal, no obvious gross SOB, gasping or wheezing  CV: no obvious cyanosis  MS: moves all visible extremities without noticeable abnormality  PSYCH/NEURO: pleasant and cooperative, no obvious depression or anxiety, speech and thought processing grossly intact  LABS: none today    Chemistry      Component Value Date/Time   NA 139 02/26/2021 1048   NA 145 (H) 06/02/2018 1514   K 4.5 02/26/2021 1048   CL 104 02/26/2021 1048   CO2 27 02/26/2021 1048   BUN 19 02/26/2021 1048   BUN 18 06/02/2018 1514   CREATININE 0.81 02/26/2021 1048      Component Value Date/Time   CALCIUM 10.2 02/26/2021 1048   ALKPHOS 64 02/26/2021 1048   AST 20 02/26/2021 1048   ALT 6 02/26/2021 1048   BILITOT 0.8 02/26/2021 1048     ASSESSMENT AND PLAN:  Discussed the following assessment and plan:  Viral syndrome/URI, household exposure to 3 family members with current covid + resp illness. Pt at high risk for covid complications: age [id number], neurologic dz (parkinson's), PAF. Not paxlovid candidate b/c pt on flecainide. Will rx molnupiravir 800 mg bid x 5d.  Discussed quarantine precautions.  I discussed the assessment and treatment plan with the patient. The patient was provided an opportunity to ask  questions and all were answered. The patient agreed with the plan and demonstrated an understanding of the instructions.   F/u: if not improving appropriately  Signed:  Crissie Sickles, MD           03/07/2021

## 2021-04-20 ENCOUNTER — Other Ambulatory Visit: Payer: Self-pay | Admitting: Neurology

## 2021-04-20 DIAGNOSIS — G20 Parkinson's disease: Secondary | ICD-10-CM

## 2021-04-21 ENCOUNTER — Emergency Department (HOSPITAL_BASED_OUTPATIENT_CLINIC_OR_DEPARTMENT_OTHER): Payer: Medicare Other

## 2021-04-21 ENCOUNTER — Other Ambulatory Visit: Payer: Self-pay

## 2021-04-21 ENCOUNTER — Encounter (HOSPITAL_BASED_OUTPATIENT_CLINIC_OR_DEPARTMENT_OTHER): Payer: Self-pay | Admitting: *Deleted

## 2021-04-21 ENCOUNTER — Emergency Department (HOSPITAL_BASED_OUTPATIENT_CLINIC_OR_DEPARTMENT_OTHER)
Admission: EM | Admit: 2021-04-21 | Discharge: 2021-04-21 | Disposition: A | Discharge status: Home / Self Care | Payer: Medicare Other | Attending: Emergency Medicine | Admitting: Emergency Medicine

## 2021-04-21 DIAGNOSIS — F840 Autistic disorder: Secondary | ICD-10-CM | POA: Insufficient documentation

## 2021-04-21 DIAGNOSIS — K5790 Diverticulosis of intestine, part unspecified, without perforation or abscess without bleeding: Secondary | ICD-10-CM | POA: Diagnosis not present

## 2021-04-21 DIAGNOSIS — M25551 Pain in right hip: Secondary | ICD-10-CM | POA: Diagnosis not present

## 2021-04-21 DIAGNOSIS — W19XXXA Unspecified fall, initial encounter: Secondary | ICD-10-CM

## 2021-04-21 DIAGNOSIS — Y900 Blood alcohol level of less than 20 mg/100 ml: Secondary | ICD-10-CM | POA: Diagnosis not present

## 2021-04-21 DIAGNOSIS — M47816 Spondylosis without myelopathy or radiculopathy, lumbar region: Secondary | ICD-10-CM | POA: Diagnosis not present

## 2021-04-21 DIAGNOSIS — M533 Sacrococcygeal disorders, not elsewhere classified: Secondary | ICD-10-CM

## 2021-04-21 DIAGNOSIS — S3993XA Unspecified injury of pelvis, initial encounter: Secondary | ICD-10-CM | POA: Diagnosis not present

## 2021-04-21 DIAGNOSIS — M545 Low back pain, unspecified: Secondary | ICD-10-CM | POA: Diagnosis not present

## 2021-04-21 DIAGNOSIS — M25552 Pain in left hip: Secondary | ICD-10-CM | POA: Diagnosis not present

## 2021-04-21 DIAGNOSIS — T450X1A Poisoning by antiallergic and antiemetic drugs, accidental (unintentional), initial encounter: Secondary | ICD-10-CM | POA: Diagnosis not present

## 2021-04-21 DIAGNOSIS — M47817 Spondylosis without myelopathy or radiculopathy, lumbosacral region: Secondary | ICD-10-CM | POA: Diagnosis not present

## 2021-04-21 MED ORDER — ACETAMINOPHEN 500 MG PO TABS: 1000.0000 mg | ORAL_TABLET | Freq: Once | ORAL | Status: DC

## 2021-04-21 MED ORDER — HYDROCODONE-ACETAMINOPHEN 5-325 MG PO TABS
1.0000 | ORAL_TABLET | Freq: Once | ORAL | Status: AC
  Administered 2021-04-21: 1 via ORAL
  Filled 2021-04-21: qty 1

## 2021-04-21 NOTE — Discharge Instructions (Addendum)
You were seen in the ED after a fall.  Your imaging was negative for any sort of fracture. Treat your pain with Tylenol. Please follow-up with your primary care doctor for reevaluation within 1 week.

## 2021-04-21 NOTE — ED Notes (Signed)
Patient urinated and was able to walk safely down hallway.

## 2021-04-21 NOTE — ED Triage Notes (Addendum)
[address] [address] backwards today.  Fell 2 stairs and patient complaint of occyx pain.

## 2021-04-21 NOTE — ED Notes (Signed)
Patient here after losing balance and falling backward down 2 steps around 1300 today. States landed on buttocks. Denies hitting head, or LOC. Complaining of coccyx pain. States painful to walk, is able to bare weight. Denies head or neck pain, denies tingling down either leg. States has been unable to urinate since. Patient tried to urinate now and was unable to do so. Will bladder scan. Patient alert and oriented x4, respirations even and unlabored. Patient complaining of 9/10 pain, does not want any pain medication at this time. Advised patient is she change her mind to let me know. Stretcher in low, locked position with side rails up x2, call bell in reach, family in room.

## 2021-04-21 NOTE — ED Notes (Signed)
Patient left ED with ABCs intact, alert and oriented x4, respirations even and unlabored. Discharge instructions reviewed and all questions answered.   

## 2021-04-21 NOTE — ED Provider Notes (Signed)
[facility] EMERGENCY DEPARTMENT Provider Note   CSN: [id number] Arrival date & time: 04/21/21  1725     History Chief Complaint  Patient presents with   Cassie White is a 80 y.o. female.  She has a past medical history of hyperlipidemia, osteoporosis, chronic lower back pain, hypertension, A. fib Eliquis, neuropathy.  Patient presents today after mechanical fall where she fell backwards down 2 steps.  This occurred earlier this afternoon around 3 PM.  She landed on her bottom.  She complains of lower back pain closer to her coccyx and bilateral hip pain.  Pain is worse with movement.  She rates pain 6 out of 10.  She was able to walk before she presented to the emergency department.  Denies hitting her head.  Patient denies any vision changes, chest pain, shortness of breath, weakness or numbness in any extremity, word finding difficulties, headache, Shooting pains down either leg, saddle anesthesia, urinary or bowel incontinence, urinary retention.   Fall Pertinent negatives include no chest pain, no abdominal pain, no headaches and no shortness of breath.      Past Medical History:  Diagnosis Date   Abnormal mammogram of left breast    Likely benign microcalcifications--repeat L diag mammo 03/24/2017.  COMPLEX SCLEROSING LESION WITH CALCIFICATIONS --no sign of malignancy.  Excision recommended--pt has been referred to Dr. Donne Hazel, who recommended f/u mammo and this was normal 12/2017--consider rpt 1 yr per rad.   Arthritis    DJD, back and both hips   Chronic low back pain without sciatica    Lumbar spondylosis + scoliosis.  Summer 2017 Dr. Nelva Bush did ESI and pt got no relief.  Pt then saw Dr. Maia Petties [facility]: felt she had facet mediated pain; plan for B L345 MBB (??).   Cystocele, midline    Diverticulosis    on colonoscopy 2004   GERD (gastroesophageal reflux disease)    Hx of cardiovascular stress test    Lexiscan Myoview (2/14):  Low  risk, small ant defect likely breast attenuation, no ischemia, EF 69% (no change from 2010).     Hyperlipidemia    Hypertension    per pt   Idiopathic scoliosis of thoracolumbar region    Lumbar back pain    Mitral regurgitation    Echo (2/14):  EF 60-65%, Gr 1 DD, mild AI, mild bileaflet MVP, mod post directed MR, mild LAE, PASP 35.   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  Responding to gabapentin (Dr. Brett Fairy).   Osteoporosis, senile    stable left hip 2015 (took fosamax x 34yr, T-score -2.7 when she stopped bisphosphonate).  Rpt DEXA 29moater (09/2016) T-score -.3.1 (different machine, though). 11/2019 DEXA T-score -4. Recommended restart of fosamax   PAF (paroxysmal atrial fibrillation) (HCC)    Dr. JoMartiniqueFlecainide, metopr, apxiaban.  08/2017-->persistent a-fib w/RVR, flecainide increased and she converted back to NSR and felt better.  '100mg'$  bid flecainide continued.   [place]inson's disease [place]   started sinemet 05/2019 (Dr. 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   Pain in joint of right shoulder 01/10/2019   Pain in both feet  12/14/2017   Neuropathy 12/14/2017   Benign essential tremor 12/14/2017   Paroxysmal atrial fibrillation (HCC) 12/14/2017   Chronic low back pain without sciatica 12/14/2017   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   Snoring 05/19/2017   S/P knee replacement 03/10/2016   Female bladder prolapse 12/06/2015   Paroxysmal atrial fibrillation with rapid ventricular response ([place]) 09/28/2015   Maxillary sinusitis, acute 08/27/2015   Mitral insufficiency 05/12/2014   HTN (hypertension) 04/28/2014   Lactic acidosis 09/07/2013    Mitral valve prolapse 06/14/2013   Chest pain, exertional 09/13/2012   Pernicious anemia 09/07/2012   DIVERTICULOSIS, COLON 09/06/2009   Osteoporosis 09/06/2009   Hyperlipidemia 09/04/2009   Peripheral sensory neuropathy 09/04/2009   LOW BACK PAIN, CHRONIC 04/08/2007    Past Surgical History:  Procedure Laterality Date   BREAST BIOPSY  04/10/2017   Left breast core needle biopsy of calcifications.  COMPLEX SCLEROSING LESION WITH CALCIFICATIONS --no sign of malignancy.  Excision recommended--pt has been referred to Dr. Donne Hazel, who recommended rpt mammo and this was NORMAL 12/2017.   BREAST EXCISIONAL BIOPSY Right 2010   Benign   BREAST SURGERY  Nov 2010   Benign biopsy, right   CHOLECYSTECTOMY  03/2010   Dr.Gross   COLONOSCOPY  10/03/2002   No polyps.  Repeat 10 yrs recommended but pt declines.  Pt declined cologuard 08/2016 but changed her mind and this test was NEG on 03/23/17.   COMBINED HYSTERECTOMY VAGINAL / OOPHORECTOMY / A&P REPAIR  0000000   uncertain if ovaries were removed or not   CYSTOCELE REPAIR     DEXA  11/2019   T score -4   Lumpectomy  06/2009   "Fatty Necrosis"   PFT's  2014   NORMAL   REVERSE SHOULDER ARTHROPLASTY Right 03/11/2019   Procedure: REVERSE TOTAL SHOULDER ARTHROPLASTY;  Surgeon: Netta Cedars, MD;  Location: [facility];  Service: Orthopedics;  Laterality: Right;  Intersclene block   ROTATOR CUFF REPAIR Left 10/02/14   Dr. Veverly Fells   TONSILLECTOMY     TOTAL KNEE ARTHROPLASTY  09/2009   Right ;Dr Alvan Dame   TOTAL KNEE ARTHROPLASTY Left 03/10/2016   Procedure: TOTAL KNEE ARTHROPLASTY;  Surgeon: Paralee Cancel, MD;  Location: [facility];  Service: Orthopedics;  Laterality: Left;   TRANSTHORACIC ECHOCARDIOGRAM  09/2012; 09/27/15   2014: EF 60-65%, Gr 1 DD, mild AI, mild bileaflet MVP, mod post directed MR, mild LAE, PASP 35.  Repeat 2017: EF 55-60%, mild AR, mild MVP and mild MV regurg.   VAGINAL HYSTERECTOMY     For Uterine Deviation      OB History     Gravida  3    Para  3   Term      Preterm      AB      Living         SAB      IAB      Ectopic      Multiple      Live Births              Family History  Problem Relation Age of Onset   Heart attack Father        in 47s   Stroke Father 30   Ovarian cancer Mother    Heart attack Son 63       Smoker   Coronary artery disease Brother    COPD Brother    Healthy Son    Healthy Daughter    Diabetes Neg Hx  Social History   Tobacco Use   Smoking status: Never   Smokeless tobacco: Never  Vaping Use   Vaping Use: Never used  Substance Use Topics   Alcohol use: No    Alcohol/week: 0.0 standard drinks   Drug use: No    Home Medications Prior to Admission medications   Medication Sig Start Date End Date Taking? Authorizing Provider  Biotin 5000 MCG CAPS Take 5,000 mcg by mouth daily.   Yes [provider]  Calcium Carbonate (CALCIUM 600 PO) Take 600 mg by mouth 2 (two) times daily.    Yes [provider]  carbidopa-levodopa (SINEMET IR) 25-100 MG tablet Take 1.5 tablets by mouth 3 (three) times daily. 10/23/20  Yes Tat, Eustace Quail, DO  ELIQUIS 5 MG TABS tablet TAKE 1 TABLET BY MOUTH TWICE A DAY 02/20/21  Yes Martinique, Peter M, MD  flecainide [facility]) 100 MG tablet TAKE 1 TABLET BY MOUTH TWICE A DAY 02/07/21  Yes Martinique, Peter M, MD  gabapentin (NEURONTIN) 300 MG capsule 2 caps po bid 02/26/21  Yes [name], Adrian Blackwater, MD  metoprolol tartrate (LOPRESSOR) 25 MG tablet Take 1 tablet (25 mg total) by mouth 2 (two) times daily. 02/26/21  Yes [name], Adrian Blackwater, MD  Omega-3 Fatty Acids (FISH OIL) 1200 MG CAPS Take 1,000 mg by mouth 2 (two) times daily.    Yes [provider]  pravastatin (PRAVACHOL) 40 MG tablet TAKE 1 TABLET BY MOUTH EVERY DAY 08/08/20  Yes Martinique, Peter M, MD  cyanocobalamin (,VITAMIN B-12,) 1000 MCG/ML injection INJECT 1 ML (1,000 MCG TOTAL) INTO THE MUSCLE EVERY 30 (THIRTY) DAYS. 12/27/20   [name], Adrian Blackwater, MD  nitroGLYCERIN (NITROSTAT)  0.4 MG SL tablet Place 1 tablet (0.4 mg total) under the tongue every 5 (five) minutes as needed for chest pain. 02/26/21   [name], Adrian Blackwater, MD    Allergies    Sulfa antibiotics and Tizanidine  Review of Systems   Review of Systems  Constitutional:  Negative for chills and fever.  HENT:  Negative for congestion and rhinorrhea.   Eyes:  Negative for visual disturbance.  Respiratory:  Negative for cough, chest tightness and shortness of breath.   Cardiovascular:  Negative for chest pain, palpitations and leg swelling.  Gastrointestinal:  Negative for abdominal pain, constipation, diarrhea, nausea and vomiting.  Genitourinary:  Negative for difficulty urinating, dysuria and enuresis.  Musculoskeletal:  Positive for arthralgias and back pain. Negative for neck pain.  Skin:  Negative for rash and wound.  Neurological:  Negative for dizziness, syncope, weakness, light-headedness, numbness and headaches.  All other systems reviewed and are negative.  Physical Exam Updated Vital Signs BP (!) 160/60 (BP Location: Right Arm)   Pulse 65   Temp 98.3 F (36.8 C) (Oral)   Resp 18   Ht '5\' 1"'$  (1.549 m)   Wt 67.1 kg   SpO2 100%   BMI 27.96 kg/m   Physical Exam Vitals and nursing note reviewed.  Constitutional:      General: She is not in acute distress.    Appearance: Normal appearance. She is not ill-appearing, toxic-appearing or diaphoretic.  HENT:     Head: Normocephalic and atraumatic.  Eyes:     General: No visual field deficit or scleral icterus.       Right eye: No discharge.        Left eye: No discharge.     Extraocular Movements: Extraocular movements intact.     Conjunctiva/sclera: Conjunctivae normal.  Pupils: Pupils are equal, round, and reactive to light.  Cardiovascular:     Rate and Rhythm: Normal rate and regular rhythm.     Pulses: Normal pulses.     Heart sounds: Normal heart sounds, S1 normal and S2 normal. No murmur heard.   No friction rub. No gallop.   Pulmonary:     Effort: Pulmonary effort is normal. No respiratory distress.     Breath sounds: Normal breath sounds. No wheezing, rhonchi or rales.  Abdominal:     General: Abdomen is flat. Bowel sounds are normal. There is no distension.     Palpations: Abdomen is soft. There is no pulsatile mass.     Tenderness: There is no abdominal tenderness. There is no guarding or rebound.  Musculoskeletal:     Cervical back: Normal. No tenderness or bony tenderness.     Lumbar back: Bony tenderness present. Negative right straight leg raise test and negative left straight leg raise test.     Right hip: Bony tenderness present. Decreased range of motion.     Left hip: Bony tenderness present. Decreased range of motion.     Right lower leg: No edema.     Left lower leg: No edema.     Comments: Midline tenderness of lumbar spine, no stepoff or deformity; reproducible muscular tenderness in paraspinal muscles DP/PT pulses 2+ and equal bilaterally No leg edema Sensation grossly intact on anterior thighs, dorsum of foot and lateral foot Strength of knee flexion and extension is 5/5 Plantar and dorsiflexion of ankle 5/5 Gait normal  Bony tenderness of sacral area of bilateral hip.  Difficulty with range of motion of bilateral lower legs.    Skin:    General: Skin is warm and dry.     Coloration: Skin is not jaundiced.     Findings: No bruising, erythema, lesion or rash.  Neurological:     General: No focal deficit present.     Mental Status: She is alert and oriented to person, place, and time.     GCS: GCS eye subscore is 4. GCS verbal subscore is 5. GCS motor subscore is 6.     Cranial Nerves: Cranial nerves are intact. No cranial nerve deficit, dysarthria or facial asymmetry.     Sensory: Sensation is intact. No sensory deficit.     Motor: Motor function is intact. No weakness.     Coordination: Coordination normal.     Gait: Gait normal.  Psychiatric:        Mood and Affect: Mood normal.         Behavior: Behavior normal.    ED Results / Procedures / Treatments   Labs (all labs ordered are listed, but only abnormal results are displayed) Labs Reviewed - No data to display  EKG None  Radiology CT Lumbar Spine Wo Contrast  Result Date: 04/21/2021 CLINICAL DATA:  Low back pain and trauma EXAM: CT LUMBAR SPINE WITHOUT CONTRAST TECHNIQUE: Multidetector CT imaging of the lumbar spine was performed without intravenous contrast administration. Multiplanar CT image reconstructions were also generated. COMPARISON:  None. FINDINGS: Segmentation: 5 lumbar type vertebrae. Alignment: Dextroscoliosis with apex at L3. Vertebrae: No acute fracture or focal pathologic process. Paraspinal and other soft tissues: Negative. Disc levels: There is multilevel moderate-to-severe facet arthrosis, worst in the lower lumbar spine. No spinal canal stenosis. IMPRESSION: 1. No acute fracture or static subluxation of the lumbar spine. 2. Dextroscoliosis with apex at L3. 3. Moderate-to-severe facet arthrosis, worst in the lower lumbar spine, which  may be a source of local low back pain. No spinal canal stenosis. Electronically Signed   By: Ulyses Jarred M.D.   On: 04/21/2021 21:04   CT PELVIS WO CONTRAST  Result Date: 04/21/2021 CLINICAL DATA:  Hip trauma, fracture suspected EXAM: CT PELVIS WITHOUT CONTRAST TECHNIQUE: Multidetector CT imaging of the pelvis was performed following the standard protocol without intravenous contrast. COMPARISON:  No prior CT, correlation is made with pelvis radiograph 07/11/2015 FINDINGS: Urinary Tract:  The bladder is unremarkable. Bowel: No evidence of obstruction. Diverticulosis without evidence of diverticulitis. Vascular/Lymphatic: Aortic atherosclerosis. No pathologically enlarged lymph nodes. Reproductive: No mass or other significant abnormality. Status post hysterectomy. Other:  None. Musculoskeletal: Osteopenia. No acute fracture. Degenerative changes in the lumbar spine,  with vacuum disc phenomenon at L4-L5 and L5-S1. Dextrocurvature of the lumbar spine. IMPRESSION: No acute process in the pelvis.  No acute fracture. Electronically Signed   By: Merilyn Baba M.D.   On: 04/21/2021 20:49    Procedures Procedures   Medications Ordered in ED Medications  acetaminophen (TYLENOL) tablet 1,000 mg (1,000 mg Oral Not Given 04/21/21 2218)  HYDROcodone-acetaminophen (NORCO/VICODIN) 5-325 MG per tablet 1 tablet (1 tablet Oral Given 04/21/21 2218)    ED Course  I have reviewed the triage vital signs and the nursing notes.  Pertinent labs & imaging results that were available during my care of the patient were reviewed by me and considered in my medical decision making (see chart for details).    MDM Rules/Calculators/A&P                         Is a well-appearing 80 year old female who presents after a fall she landed on her sacral area.  She is well-appearing and her vital signs are dynamically stable with no fever noted.  She has a history of osteoporosis and is on blood thinners.  She denies hitting her head and she has a reliable source.  She has no neurological symptoms on exam.  Stable to sent for CT head imaging.  Given history of osteoporosis, obtained CT lumbar and hip imaging.  No acute fractures were found on imaging.  Patient was ambulated with assistance of the nurse prior to discharge with no significant difficulty.  She was given 1 dose of Norco prior to discharge for pain management.  I discussed these findings with the patient and her daughter who is at bedside and they are comfortable with the plan going forward.  Recommend following up with PCP within the next week for further evaluation.  Dr. Maryan Rued saw this patient and agrees with the plan going forward.   Final Clinical Impression(s) / ED Diagnoses Final diagnoses:  Fall, initial encounter  Sacral pain    Rx / DC Orders ED Discharge Orders     None        Adolphus Birchwood,  PA-C 04/21/21 2333    Blanchie Dessert, MD 04/23/21 1319

## 2021-04-21 NOTE — ED Notes (Signed)
Bladder scan between 200-217 mL

## 2021-04-22 ENCOUNTER — Other Ambulatory Visit: Payer: Self-pay

## 2021-04-24 NOTE — Progress Notes (Signed)
Virtual Visit Via Video   The purpose of this virtual visit is to provide medical care while limiting exposure to the novel coronavirus.    Consent was obtained for video visit:  Yes.   Answered questions that patient had about telehealth interaction:  Yes.   I discussed the limitations, risks, security and privacy concerns of performing an evaluation and management service by telemedicine. I also discussed with the patient that there may be a patient responsible charge related to this service. The patient expressed understanding and agreed to proceed.  Pt location: Home Physician Location: office Name of referring provider:  Tammi Sou, MD I connected with Cassie White at patients initiation/request on 04/25/2021 at 11:15 AM EDT by video enabled telemedicine application and verified that I am speaking with the correct person using two identifiers. Pt MRN:  [id number] Pt DOB:  March 26, 1941 Video Participants:  [name];  daughter  Assessment/Plan:   1.  Parkinsons Disease  -Increase carbidopa/levodopa 25/100, 2 tablets 3 times per day   Subjective:   Cassie White was seen today in follow up for Parkinsons disease.  My previous records were reviewed prior to todays visit as well as outside records available to me.  Patient is with her daughter who supplements the history (but it was hard to hear her daughter over the video).  Pt was in the emergency room on September 18 after a fall.  She fell down 2 steps.  She fell on her buttocks.  She did not hit her head.  While the body of the ER report stated that she was sent for CT head imaging, she was not.  She had CT pelvis which was negative.  She had a CT lumbar spine that was nonacute.  There was moderate to severe facet arthrosis, but it was nonacute.  The fall was the reason she moved today to a VV.  Pt denies lightheadedness, near syncope.  She would like to try to increase her levodopa a bit.  Feels it wears  off.  Current prescribed movement disorder medications: Carbidopa/levodopa 25/100, 1.5 tablets 3 times per day   PREVIOUS MEDICATIONS: Sinemet  ALLERGIES:   Allergies  Allergen Reactions   Sulfa Antibiotics Swelling    face   Tizanidine Other (See Comments)    delirium    CURRENT MEDICATIONS:  Outpatient Encounter Medications as of 04/25/2021  Medication Sig   Biotin 5000 MCG CAPS Take 5,000 mcg by mouth daily.   Calcium Carbonate (CALCIUM 600 PO) Take 600 mg by mouth 2 (two) times daily.    carbidopa-levodopa (SINEMET IR) 25-100 MG tablet TAKE 1.5 TABLETS BY MOUTH 3 TIMES DAILY.   cyanocobalamin (,VITAMIN B-12,) 1000 MCG/ML injection INJECT 1 ML (1,000 MCG TOTAL) INTO THE MUSCLE EVERY 30 (THIRTY) DAYS.   ELIQUIS 5 MG TABS tablet TAKE 1 TABLET BY MOUTH TWICE A DAY   flecainide (TAMBOCOR) 100 MG tablet TAKE 1 TABLET BY MOUTH TWICE A DAY   gabapentin (NEURONTIN) 300 MG capsule 2 caps po bid   metoprolol tartrate (LOPRESSOR) 25 MG tablet Take 1 tablet (25 mg total) by mouth 2 (two) times daily.   nitroGLYCERIN (NITROSTAT) 0.4 MG SL tablet Place 1 tablet (0.4 mg total) under the tongue every 5 (five) minutes as needed for chest pain.   Omega-3 Fatty Acids (FISH OIL) 1200 MG CAPS Take 1,000 mg by mouth 2 (two) times daily.    pravastatin (PRAVACHOL) 40 MG tablet TAKE 1 TABLET BY MOUTH EVERY DAY  No facility-administered encounter medications on file as of 04/25/2021.    Objective:   PHYSICAL EXAMINATION:    VITALS:  There were no vitals filed for this visit.  GEN:  The patient appears stated age and is in NAD.  Neurological examination:  Orientation: The patient is alert and oriented x3. Cranial nerves: There is good facial symmetry. There is mild facial hypomimia.  The speech is fluent and clear. Soft palate rises symmetrically and there is no tongue deviation. Hearing is intact to conversational tone. Motor: Strength is at least antigravity x 4.   Shoulder shrug is equal  and symmetric.  There is no pronator drift.  Movement examination: Tone: unable Abnormal movements: None seen Coordination:  There is no decremation with hand opening and closing her finger taps bilaterally Gait and Station: The patient pushes off of the chair.  She uses her walker to ambulate.  She is slow, but ambulates fairly well in the home.   I have reviewed and interpreted the following labs independently    Chemistry      Component Value Date/Time   NA 139 02/26/2021 1048   NA 145 (H) 06/02/2018 1514   K 4.5 02/26/2021 1048   CL 104 02/26/2021 1048   CO2 27 02/26/2021 1048   BUN 19 02/26/2021 1048   BUN 18 06/02/2018 1514   CREATININE 0.81 02/26/2021 1048      Component Value Date/Time   CALCIUM 10.2 02/26/2021 1048   ALKPHOS 64 02/26/2021 1048   AST 20 02/26/2021 1048   ALT 6 02/26/2021 1048   BILITOT 0.8 02/26/2021 1048       Lab Results  Component Value Date   WBC 5.7 02/26/2021   HGB 13.4 02/26/2021   HCT 40.5 02/26/2021   MCV 93.7 02/26/2021   PLT 157.0 02/26/2021    Lab Results  Component Value Date   TSH 1.32 04/21/2019   Follow up Instructions      -I discussed the assessment and treatment plan with the patient. The patient was provided an opportunity to ask questions and all were answered. The patient agreed with the plan and demonstrated an understanding of the instructions.   The patient was advised to call back or seek an in-person evaluation if the symptoms worsen or if the condition fails to improve as anticipated.    Alonza Bogus, DO   Cc:  [name], Adrian Blackwater, MD

## 2021-04-25 ENCOUNTER — Other Ambulatory Visit: Payer: Self-pay

## 2021-04-25 ENCOUNTER — Telehealth (INDEPENDENT_AMBULATORY_CARE_PROVIDER_SITE_OTHER): Payer: Medicare Other | Admitting: Neurology

## 2021-04-25 VITALS — BP 120/70 | Wt 148.0 lb

## 2021-04-25 DIAGNOSIS — G20 Parkinson's disease: Secondary | ICD-10-CM

## 2021-04-25 MED ORDER — CARBIDOPA-LEVODOPA 25-100 MG PO TABS: 2.0000 | ORAL_TABLET | Freq: Three times a day (TID) | ORAL | 1 refills | Status: DC

## 2021-04-27 ENCOUNTER — Telehealth: Payer: Self-pay

## 2021-04-27 ENCOUNTER — Ambulatory Visit (INDEPENDENT_AMBULATORY_CARE_PROVIDER_SITE_OTHER): Payer: Medicare Other

## 2021-04-27 DIAGNOSIS — Z0000 Encounter for general adult medical examination without abnormal findings: Secondary | ICD-10-CM

## 2021-04-27 NOTE — Progress Notes (Addendum)
Subjective:   Cassie White is a 80 y.o. female who presents for Medicare Annual (Subsequent) preventive examination.   connected with  Tanishia A Delany on 04/27/21 by an audio only telemedicine application and verified that I am speaking with the correct person using two identifiers.   I discussed the limitations, risks, security and privacy concerns of performing an evaluation and management service by telephone and the availability of in person appointments. I also discussed with the patient that there may be a patient responsible charge related to this service. The patient expressed understanding and verbally consented to this telephonic visit.  Location of Patient; Home   Location of Provider: Office  List any persons and their role that are participating in the visit with the patient.    Review of Systems    Defer to PCP       Objective:    There were no vitals filed for this visit. There is no height or weight on file to calculate BMI.  Advanced Directives 04/25/2021 04/21/2021 10/23/2020 04/27/2020 11/15/2019 04/21/2019 03/11/2019  Does Patient Have a Medical Advance Directive? Yes No Yes No No No No  Type of Advance Directive Living will - [place]  Would patient like information on creating a medical advance directive? - - - - - No - Patient declined No - Patient declined  Pre-existing out of facility DNR order (yellow form or pink MOST form) - - - - - - -    Current Medications (verified) Outpatient Encounter Medications as of 04/27/2021  Medication Sig   Biotin 5000 MCG CAPS Take 5,000 mcg by mouth daily.   Calcium Carbonate (CALCIUM 600 PO) Take 600 mg by mouth 2 (two) times daily.    carbidopa-levodopa (SINEMET IR) 25-100 MG tablet Take 2 tablets by mouth 3 (three) times daily.   cyanocobalamin (,VITAMIN B-12,) 1000 MCG/ML injection INJECT 1 ML (1,000 MCG TOTAL) INTO THE MUSCLE EVERY 30 (THIRTY) DAYS.   ELIQUIS 5 MG TABS tablet TAKE 1 TABLET  BY MOUTH TWICE A DAY   flecainide (TAMBOCOR) 100 MG tablet TAKE 1 TABLET BY MOUTH TWICE A DAY   gabapentin (NEURONTIN) 300 MG capsule 2 caps po bid   metoprolol tartrate (LOPRESSOR) 25 MG tablet Take 1 tablet (25 mg total) by mouth 2 (two) times daily.   nitroGLYCERIN (NITROSTAT) 0.4 MG SL tablet Place 1 tablet (0.4 mg total) under the tongue every 5 (five) minutes as needed for chest pain.   Omega-3 Fatty Acids (FISH OIL) 1200 MG CAPS Take 1,000 mg by mouth 2 (two) times daily.    pravastatin (PRAVACHOL) 40 MG tablet TAKE 1 TABLET BY MOUTH EVERY DAY   No facility-administered encounter medications on file as of 04/27/2021.    Allergies (verified) Sulfa antibiotics and Tizanidine   History: Past Medical History:  Diagnosis Date   Abnormal mammogram of left breast    Likely benign microcalcifications--repeat L diag mammo 03/24/2017.  COMPLEX SCLEROSING LESION WITH CALCIFICATIONS --no sign of malignancy.  Excision recommended--pt has been referred to Dr. Donne Hazel, who recommended f/u mammo and this was normal 12/2017--consider rpt 1 yr per rad.   Arthritis    DJD, back and both hips   Chronic low back pain without sciatica    Lumbar spondylosis + scoliosis.  Summer 2017 Dr. Nelva Bush did ESI and pt got no relief.  Pt then saw Dr. Maia Petties [facility]: felt she had facet mediated pain; plan for B L345 MBB (??).  Cystocele, midline    Diverticulosis    on colonoscopy 2004   GERD (gastroesophageal reflux disease)    Hx of cardiovascular stress test    Lexiscan Myoview (2/14):  Low risk, small ant defect likely breast attenuation, no ischemia, EF 69% (no change from 2010).     Hyperlipidemia    Hypertension    per pt   Idiopathic scoliosis of thoracolumbar region    Lumbar back pain    Mitral regurgitation    Echo (2/14):  EF 60-65%, Gr 1 DD, mild AI, mild bileaflet MVP, mod post directed MR, mild LAE, PASP 35.   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  Responding to gabapentin (Dr. Brett Fairy).   Osteoporosis, senile    stable left hip 2015 (took fosamax x 7yrs, T-score -2.7 when she stopped bisphosphonate).  Rpt DEXA 71mo later (09/2016) T-score -.3.1 (different machine, though). 11/2019 DEXA T-score -4. Recommended restart of fosamax   PAF (paroxysmal atrial fibrillation) (HCC)    Dr. Martinique: Flecainide, metopr, apxiaban.  08/2017-->persistent a-fib w/RVR, flecainide increased and she converted back to NSR and felt better.  100mg  bid flecainide continued.   Parkinson's disease (Brenda) 2020 dx   started sinemet 05/2019 (Dr. Carles Collet)   Peripheral neuropathy 2008   Idiopathic vs familial (motor>sensory) vs lumbar disc dz: gabapentin helpful   Shoulder pain left   RC surg 09/2014   Past Surgical History:  Procedure Laterality Date   BREAST BIOPSY  04/10/2017   Left breast core needle biopsy of calcifications.  COMPLEX SCLEROSING LESION WITH CALCIFICATIONS --no sign of malignancy.  Excision recommended--pt has been referred to Dr. Donne Hazel, who recommended rpt mammo and this was NORMAL 12/2017.   BREAST EXCISIONAL BIOPSY Right 2010   Benign   BREAST SURGERY  Nov 2010   Benign biopsy, right   CHOLECYSTECTOMY  03/2010   Dr.Gross   COLONOSCOPY  10/03/2002   No polyps.  Repeat 10 yrs recommended but pt declines.  Pt declined cologuard 08/2016 but changed her mind and this test was NEG on 03/23/17.   COMBINED HYSTERECTOMY VAGINAL / OOPHORECTOMY / A&P REPAIR  8588   uncertain if ovaries were removed or not   CYSTOCELE REPAIR     DEXA  11/2019   T score -4   Lumpectomy  06/2009   "Fatty Necrosis"   PFT's  2014   NORMAL   REVERSE SHOULDER ARTHROPLASTY Right 03/11/2019   Procedure: REVERSE TOTAL SHOULDER ARTHROPLASTY;  Surgeon: Netta Cedars, MD;  Location: [facility];  Service: Orthopedics;  Laterality: Right;  Intersclene block   ROTATOR CUFF REPAIR Left 10/02/14   Dr. Veverly Fells   TONSILLECTOMY      TOTAL KNEE ARTHROPLASTY  09/2009   Right ;Dr Alvan Dame   TOTAL KNEE ARTHROPLASTY Left 03/10/2016   Procedure: TOTAL KNEE ARTHROPLASTY;  Surgeon: Paralee Cancel, MD;  Location: [facility];  Service: Orthopedics;  Laterality: Left;   TRANSTHORACIC ECHOCARDIOGRAM  09/2012; 09/27/15   2014: EF 60-65%, Gr 1 DD, mild AI, mild bileaflet MVP, mod post directed MR, mild LAE, PASP 35.  Repeat 2017: EF 55-60%, mild AR, mild MVP and mild MV regurg.   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   Orig from [place].   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   Caffiene, 1 cup daily avg.   No tob, no alc, no drugs.   Exercise: walking, limited by bilat hip pain.   Social Determinants of Health   Financial Resource Strain: Not on file  Food Insecurity: Not on file  Transportation Needs: Not on file  Physical Activity: Not on file  Stress: Not on file  Social Connections: Not on file    Tobacco Counseling Counseling given: Not  Answered   Clinical Intake:                 Diabetic?no         Activities of Daily Living No flowsheet data found.  Patient Care Team: Tammi Sou, MD as PCP - General (Family Medicine) Martinique, Peter M, MD as PCP - Cardiology (Cardiology) Martinique, Peter M, MD as Consulting Physician (Cardiology) Dohmeier, Asencion Partridge, MD as Consulting Physician (Neurology) Suella Broad, MD as Consulting Physician (Physical Medicine and Rehabilitation) Zonia Kief, MD as Consulting Physician (Rehabilitation) Starling Manns, MD (Orthopedic Surgery) Ward Givens, NP as Consulting Physician (Gerontology) Tat, Eustace Quail, DO as Consulting Physician (Neurology) Elesa Massed, NP (Inactive) as Nurse Practitioner (Obstetrics and Gynecology) Tona Sensing, FNP (Nurse Practitioner)  Indicate any recent Medical Services you may have received from other than [facility] providers in the past year (date may be approximate).     Assessment:   This is a routine wellness examination for Madailein.  Hearing/Vision screen No results found.  Dietary issues and exercise activities discussed:     Goals Addressed   None   Depression Screen PHQ 2/9 Scores 02/26/2021 03/26/2018 08/08/2016 06/22/2015 08/07/2014 09/07/2012  PHQ - 2 Score 0 0 0 0 0 1    Fall Risk Fall Risk  04/25/2021 10/23/2020 04/27/2020 11/15/2019 04/21/2019  Falls in the past year? 1 1 0 1 1  Comment - - - - -  Number falls in past yr: 0 0 0 0 1  Injury with Fall? 0 0 0 0 1    FALL RISK PREVENTION PERTAINING TO THE HOME:  Any stairs in or around the home? Yes  If so, are there any without handrails? Yes  Home free of loose throw rugs in walkways, pet beds, electrical cords, etc? Yes  Adequate lighting in your home to reduce risk of falls? Yes   ASSISTIVE DEVICES UTILIZED TO PREVENT FALLS:  Life alert? No  Use of a cane, walker or w/c? No  Grab bars in the bathroom? Yes  Shower chair or bench in shower? Yes  Elevated toilet seat  or a handicapped toilet? Yes   TIMED UP AND GO:  Was the test performed? No .  Length of time to ambulate 10 feet: n/a sec.   Cognitive Function: MMSE - Mini Mental State Exam 08/08/2016  Orientation to time 5  Orientation to Place 5  Registration 3  Attention/ Calculation 5  Recall 2  Language- name 2 objects 2  Language- repeat 1  Language- follow 3 step command 3  Language- read & follow direction 1  Write a sentence 1  Copy design 1  Total score 29        Immunizations Immunization History  Administered Date(s) Administered   Fluad Quad(high Dose 65+) 04/26/2019   Influenza Whole 05/04/2012   Influenza, High Dose Seasonal PF 05/12/2013, 04/28/2014, 05/28/2015, 04/28/2016, 05/18/2017, 05/12/2018   Moderna Sars-Covid-2 Vaccination 08/27/2019, 09/24/2019, 07/17/2020   Pneumococcal Conjugate-13 12/25/2014   Pneumococcal Polysaccharide-23 09/07/2012   Td 02/14/2010   Zoster Recombinat (Shingrix) 02/28/2021    TDAP status: Due, Education has been provided regarding the importance of this vaccine. Advised may receive this vaccine at local pharmacy or Health Dept. Aware to provide a copy of the vaccination record if obtained from local pharmacy or Health Dept. Verbalized acceptance and understanding.  Flu Vaccine status: Due, Education has been provided regarding the importance of this vaccine. Advised may receive this vaccine at local pharmacy or Health Dept. Aware to provide a copy of the vaccination record if obtained from local pharmacy or Health Dept. Verbalized acceptance and understanding.  Pneumococcal vaccine status: Up to date  Covid-19 vaccine status: Information provided on how to obtain vaccines.   Qualifies for Shingles Vaccine? Yes   Zostavax completed No   Shingrix Completed?: No.    Education has been provided regarding the importance of this vaccine. Patient has been advised to call insurance company to determine out of pocket expense if they have not yet  received this vaccine. Advised may also receive vaccine at local pharmacy or Health Dept. Verbalized acceptance and understanding.  Screening Tests Health Maintenance  Topic Date Due   COLONOSCOPY (Pts 45-31yrs Insurance coverage will need to be confirmed)  04/02/2020   COVID-19 Vaccine (4 - Booster for Moderna series) 10/09/2020   INFLUENZA VACCINE  03/04/2021   Zoster Vaccines- Shingrix (2 of 2) 04/25/2021   DEXA SCAN  Completed   HPV VACCINES  Aged Out   TETANUS/TDAP  Discontinued    Health Maintenance  Health Maintenance Due  Topic Date Due   COLONOSCOPY (Pts 45-38yrs Insurance coverage will need to be confirmed)  04/02/2020   COVID-19 Vaccine (4 - Booster for Moderna series) 10/09/2020   INFLUENZA VACCINE  03/04/2021   Zoster Vaccines- Shingrix (2 of 2) 04/25/2021    Colorectal cancer screening: No longer required.   Mammogram status: Completed 03/2020. Repeat every year. Have done with GYN.   Bone Density status: Completed 11/2019. Results reflect: Bone density results: OSTEOPOROSIS. Repeat every 2 years.  Lung Cancer Screening: (Low Dose CT Chest recommended if Age 26-80 years, 30 pack-year currently smoking OR have quit w/in 15years.) does not qualify.   Lung Cancer Screening Referral: n/a  Additional Screening:  Hepatitis C Screening: does not qualify; Completed /a  Vision Screening: Recommended annual ophthalmology exams for early detection of glaucoma and other disorders of the eye. Is the patient up to date with their annual eye exam?  Yes  Who is  the provider or what is the name of the office in which the patient attends annual eye exams? [facility] in [place] If pt is not established with a provider, would they like to be referred to a provider to establish care?  N/a .   Dental Screening: Recommended annual dental exams for proper oral hygiene  Community Resource Referral / Chronic Care Management: CRR required this visit?  No   CCM required this visit?  No       Plan:     I have personally reviewed and noted the following in the patient's chart:   Medical and social history Use of alcohol, tobacco or illicit drugs  Current medications and supplements including opioid prescriptions.  Functional ability and status Nutritional status Physical activity Advanced directives List of other physicians Hospitalizations, surgeries, and ER visits in previous 12 months Vitals Screenings to include cognitive, depression, and falls Referrals and appointments  In addition, I have reviewed and discussed with patient certain preventive protocols, quality metrics, and best practice recommendations. A written personalized care plan for preventive services as well as general preventive health recommendations were provided to patient.     Kavin Leech, [facility]   04/27/2021   Nurse Notes: Non face to face visit time was 25 minutes    Ms. Bullen , Thank you for taking time to come for your Medicare Wellness Visit. I appreciate your ongoing commitment to your health goals. Please review the following plan we discussed and let me know if I can assist you in the future.   These are the goals we discussed:  Goals       <enter goal here> (pt-stated)      "to be able to walk better". Plans to receive injections in back then become more active.         This is a list of the screening recommended for you and due dates:  Health Maintenance  Topic Date Due   Colon Cancer Screening  04/02/2020   COVID-19 Vaccine (4 - Booster for Moderna series) 10/09/2020   Flu Shot  03/04/2021   Zoster (Shingles) Vaccine (2 of 2) 04/25/2021   DEXA scan (bone density measurement)  Completed   HPV Vaccine  Aged Out   Tetanus Vaccine  Discontinued     I agree with the above data. Signed:  Crissie Sickles, MD           05/02/2021

## 2021-04-27 NOTE — Telephone Encounter (Signed)
Spoke with pt and daughter, pt had a recent fall and have been in a lot of pain.   Pt states she been using some old hydrocodone from a prior surgery left over. Pt has been having trouble walk and hurts during the night. Pt has already been seen by ED.   Please advise if there's an earlier appt avail or anything that pt can use to help with pain since she is now out of medication.

## 2021-04-29 ENCOUNTER — Other Ambulatory Visit: Payer: Self-pay | Admitting: Family Medicine

## 2021-04-29 MED ORDER — HYDROCODONE-ACETAMINOPHEN 5-325 MG PO TABS: 1.0000 | ORAL_TABLET | Freq: Four times a day (QID) | ORAL | 0 refills | Status: DC | PRN

## 2021-04-29 NOTE — Telephone Encounter (Signed)
Spoke with pt regarding rx refill and f/u appt, she voiced understanding.  Appt scheduled.

## 2021-04-29 NOTE — Telephone Encounter (Signed)
OK, hydrocodone rx'd. F/u 7-10d

## 2021-05-03 ENCOUNTER — Other Ambulatory Visit: Payer: Self-pay | Admitting: General Surgery

## 2021-05-03 DIAGNOSIS — N6489 Other specified disorders of breast: Secondary | ICD-10-CM

## 2021-05-03 DIAGNOSIS — N6310 Unspecified lump in the right breast, unspecified quadrant: Secondary | ICD-10-CM

## 2021-05-10 ENCOUNTER — Other Ambulatory Visit: Payer: Self-pay

## 2021-05-10 ENCOUNTER — Ambulatory Visit (INDEPENDENT_AMBULATORY_CARE_PROVIDER_SITE_OTHER): Payer: Medicare Other | Admitting: Family Medicine

## 2021-05-10 ENCOUNTER — Encounter: Payer: Self-pay | Admitting: Family Medicine

## 2021-05-10 VITALS — BP 110/63 | HR 53 | Temp 97.4°F | Ht 61.0 in | Wt 153.4 lb

## 2021-05-10 DIAGNOSIS — M810 Age-related osteoporosis without current pathological fracture: Secondary | ICD-10-CM

## 2021-05-10 DIAGNOSIS — Z23 Encounter for immunization: Secondary | ICD-10-CM

## 2021-05-10 DIAGNOSIS — W19XXXD Unspecified fall, subsequent encounter: Secondary | ICD-10-CM | POA: Diagnosis not present

## 2021-05-10 DIAGNOSIS — Z9181 History of falling: Secondary | ICD-10-CM

## 2021-05-10 DIAGNOSIS — S300XXD Contusion of lower back and pelvis, subsequent encounter: Secondary | ICD-10-CM

## 2021-05-10 MED ORDER — RALOXIFENE HCL 60 MG PO TABS: 60.0000 mg | ORAL_TABLET | Freq: Every day | ORAL | 3 refills | Status: DC

## 2021-05-10 NOTE — Progress Notes (Signed)
OFFICE VISIT  05/10/2021  CC:  Chief Complaint  Patient presents with   Follow-up    ED    HPI:    Patient is a 80 y.o. female who presents for f/u ED visit on 04/21/21 for a fall onto her bottom.  I reviewed her ED encounter data today. [place] backwards down 2 steps onto her bottom--was trying to reach out to grab the hand of someone offering balance to finish coming down steps.  No dizziness.  Sacral pain-->CT L spine and pelvis neg acute. A dose of vicodin was given in ED, no rx's at d/c.  INTERIM HX: Doing much better but has been taking 1 vicodin q6h.  No further falls.  Uses walker most of the time. She feels like she could try taking less and see how it feels. Pain localized in mid lower sacrum and coccyx.  No bruising.  No hip pains or lumbar pains. No further falls.  Has known dx of osteoporosis, treated x years with alendronate and tolerated it well. Most recent T score -4 back in April 202->had been off alendronate for a while at that time.. Alendronate rx'd (07/2020) but she chose not to take it b/c fear of osteonecrosis of jaw.  ROS as above, plus--> no fevers, no CP, no SOB, no wheezing, no cough, no dizziness, no HAs, no rashes, no melena/hematochezia.  No polyuria or polydipsia.  No myalgias or arthralgias.  No focal weakness, no paresthesias. No acute vision or hearing abnormalities.  No dysuria or unusual/new urinary urgency or frequency.  No recent changes in lower legs. No n/v/d or abd pain.  No palpitations.    Past Medical History:  Diagnosis Date   Abnormal mammogram of left breast    Likely benign microcalcifications--repeat L diag mammo 03/24/2017.  COMPLEX SCLEROSING LESION WITH CALCIFICATIONS --no sign of malignancy.  Excision recommended--pt has been referred to Dr. Donne Hazel, who recommended f/u mammo and this was normal 12/2017--consider rpt 1 yr per rad.   Arthritis    DJD, back and both hips   Chronic low back pain without sciatica    Lumbar spondylosis  + scoliosis.  Summer 2017 Dr. Nelva Bush did ESI and pt got no relief.  Pt then saw Dr. Maia Petties [facility]: felt she had facet mediated pain; plan for B L345 MBB (??).   Cystocele, midline    Diverticulosis    on colonoscopy 2004   GERD (gastroesophageal reflux disease)    Hx of cardiovascular stress test    Lexiscan Myoview (2/14):  Low risk, small ant defect likely breast attenuation, no ischemia, EF 69% (no change from 2010).     Hyperlipidemia    Hypertension    per pt   Idiopathic scoliosis of thoracolumbar region    Lumbar back pain    Mitral regurgitation    Echo (2/14):  EF 60-65%, Gr 1 DD, mild AI, mild bileaflet MVP, mod post directed MR, mild LAE, PASP 35.   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  Responding to gabapentin (Dr. Brett Fairy).   Osteoporosis, senile    stable left hip 2015 (took fosamax x 22yrs, T-score -2.7 when she stopped bisphosphonate).  Rpt DEXA 100mo later (09/2016) T-score -.3.1 (different machine, though). 11/2019 DEXA T-score -4. Recommended restart of fosamax   PAF (paroxysmal atrial fibrillation) (HCC)    Dr. Martinique: Flecainide, metopr, apxiaban.  08/2017-->persistent a-fib w/RVR, flecainide increased and she converted back to NSR  and felt better.  100mg  bid flecainide continued.   Parkinson's disease ([place]) 2020 dx   started sinemet 05/2019 (Dr. Carles Collet)   Peripheral neuropathy 2008   Idiopathic vs familial (motor>sensory) vs lumbar disc dz: gabapentin helpful   Shoulder pain left   RC surg 09/2014    Past Surgical History:  Procedure Laterality Date   BREAST BIOPSY  04/10/2017   Left breast core needle biopsy of calcifications.  COMPLEX SCLEROSING LESION WITH CALCIFICATIONS --no sign of malignancy.  Excision recommended--pt has been referred to Dr. Donne Hazel, who recommended rpt mammo and this was NORMAL 12/2017.   BREAST EXCISIONAL BIOPSY Right 2010   Benign   BREAST  SURGERY  Nov 2010   Benign biopsy, right   CHOLECYSTECTOMY  03/2010   Dr.Gross   COLONOSCOPY  10/03/2002   No polyps.  Repeat 10 yrs recommended but pt declines.  Pt declined cologuard 08/2016 but changed her mind and this test was NEG on 03/23/17.   COMBINED HYSTERECTOMY VAGINAL / OOPHORECTOMY / A&P REPAIR  5397   uncertain if ovaries were removed or not   CYSTOCELE REPAIR     DEXA  11/2019   T score -4   Lumpectomy  06/2009   "Fatty Necrosis"   PFT's  2014   NORMAL   REVERSE SHOULDER ARTHROPLASTY Right 03/11/2019   Procedure: REVERSE TOTAL SHOULDER ARTHROPLASTY;  Surgeon: Netta Cedars, MD;  Location: [facility];  Service: Orthopedics;  Laterality: Right;  Intersclene block   ROTATOR CUFF REPAIR Left 10/02/14   Dr. Veverly Fells   TONSILLECTOMY     TOTAL KNEE ARTHROPLASTY  09/2009   Right ;Dr Alvan Dame   TOTAL KNEE ARTHROPLASTY Left 03/10/2016   Procedure: TOTAL KNEE ARTHROPLASTY;  Surgeon: Paralee Cancel, MD;  Location: [facility];  Service: Orthopedics;  Laterality: Left;   TRANSTHORACIC ECHOCARDIOGRAM  09/2012; 09/27/15   2014: EF 60-65%, Gr 1 DD, mild AI, mild bileaflet MVP, mod post directed MR, mild LAE, PASP 35.  Repeat 2017: EF 55-60%, mild AR, mild MVP and mild MV regurg.   VAGINAL HYSTERECTOMY     For Uterine Deviation     Outpatient Medications Prior to Visit  Medication Sig Dispense Refill   Biotin 5000 MCG CAPS Take 5,000 mcg by mouth daily.     Calcium Carbonate (CALCIUM 600 PO) Take 600 mg by mouth 2 (two) times daily.      carbidopa-levodopa (SINEMET IR) 25-100 MG tablet Take 2 tablets by mouth 3 (three) times daily. 540 tablet 1   cyanocobalamin (,VITAMIN B-12,) 1000 MCG/ML injection INJECT 1 ML (1,000 MCG TOTAL) INTO THE MUSCLE EVERY 30 (THIRTY) DAYS. 10 mL 0   ELIQUIS 5 MG TABS tablet TAKE 1 TABLET BY MOUTH TWICE A DAY 60 tablet 5   flecainide (TAMBOCOR) 100 MG tablet TAKE 1 TABLET BY MOUTH TWICE A DAY 180 tablet 1   gabapentin (NEURONTIN) 300 MG capsule 2 caps po bid 360 capsule 3    HYDROcodone-acetaminophen (NORCO/VICODIN) 5-325 MG tablet Take 1-2 tablets by mouth every 6 (six) hours as needed for moderate pain. 30 tablet 0   metoprolol tartrate (LOPRESSOR) 25 MG tablet Take 1 tablet (25 mg total) by mouth 2 (two) times daily. 180 tablet 3   Omega-3 Fatty Acids (FISH OIL) 1200 MG CAPS Take 1,000 mg by mouth 2 (two) times daily.      pravastatin (PRAVACHOL) 40 MG tablet TAKE 1 TABLET BY MOUTH EVERY DAY 90 tablet 3   nitroGLYCERIN (NITROSTAT) 0.4 MG SL tablet Place 1 tablet (0.4  mg total) under the tongue every 5 (five) minutes as needed for chest pain. (Patient not taking: Reported on 05/10/2021) 15 tablet 1   No facility-administered medications prior to visit.    Allergies  Allergen Reactions   Sulfa Antibiotics Swelling    face   Tizanidine Other (See Comments)    delirium    ROS As per HPI  PE: Vitals with BMI 05/10/2021 04/25/2021 04/21/2021  Height 5\' 1"  - -  Weight 153 lbs 6 oz 148 lbs -  BMI 29 00.86 -  Systolic 761 950 932  Diastolic 63 70 60  Pulse 53 - 65    Gen: Alert, well appearing.  Patient is oriented to person, place, time, and situation. AFFECT: pleasant, lucid thought and speech. Mild TTP from mid sacrum down to coccyx.  No hip tenderness.  LABS:    Chemistry      Component Value Date/Time   NA 139 02/26/2021 1048   NA 145 (H) 06/02/2018 1514   K 4.5 02/26/2021 1048   CL 104 02/26/2021 1048   CO2 27 02/26/2021 1048   BUN 19 02/26/2021 1048   BUN 18 06/02/2018 1514   CREATININE 0.81 02/26/2021 1048      Component Value Date/Time   CALCIUM 10.2 02/26/2021 1048   ALKPHOS 64 02/26/2021 1048   AST 20 02/26/2021 1048   ALT 6 02/26/2021 1048   BILITOT 0.8 02/26/2021 1048      IMPRESSION AND PLAN:  #1: Fall onto sacrum/coccyx.  Gradually improving pain.  Start to wean off Vicodin and see how things feel.  She will let me know if not tolerating.  No new prescription given today.  #2.  Osteoporosis, history of long-term treatment  on alendronate.  High fall risk. Has been off this med for few years now and 2021 bone density showed T score of -4. Pt declines bisphosphonates and prolia d/t fear of possible osteonecrosis of jaw. Discussed endo referral vs trial of raloxifene and she chose the latter: raloxifene 60mg  qd eRx'd today.  Signed:  Crissie Sickles, MD           05/10/2021

## 2021-06-06 ENCOUNTER — Other Ambulatory Visit: Payer: Medicare Other

## 2021-07-09 ENCOUNTER — Ambulatory Visit: Payer: Medicare Other

## 2021-07-09 ENCOUNTER — Ambulatory Visit
Admission: RE | Admit: 2021-07-09 | Discharge: 2021-07-09 | Disposition: A | Discharge status: Home / Self Care | Payer: Medicare Other | Source: Ambulatory Visit | Attending: General Surgery | Admitting: General Surgery

## 2021-07-09 DIAGNOSIS — N6489 Other specified disorders of breast: Secondary | ICD-10-CM

## 2021-07-09 DIAGNOSIS — R922 Inconclusive mammogram: Secondary | ICD-10-CM | POA: Diagnosis not present

## 2021-07-09 DIAGNOSIS — N6310 Unspecified lump in the right breast, unspecified quadrant: Secondary | ICD-10-CM

## 2021-07-24 ENCOUNTER — Other Ambulatory Visit: Payer: Self-pay | Admitting: Cardiology

## 2021-07-24 ENCOUNTER — Other Ambulatory Visit: Payer: Self-pay | Admitting: Family Medicine

## 2021-08-30 ENCOUNTER — Ambulatory Visit: Payer: Medicare Other | Admitting: Family Medicine

## 2021-09-05 ENCOUNTER — Other Ambulatory Visit: Payer: Self-pay

## 2021-09-06 ENCOUNTER — Ambulatory Visit (INDEPENDENT_AMBULATORY_CARE_PROVIDER_SITE_OTHER): Payer: Medicare Other | Admitting: Family Medicine

## 2021-09-06 ENCOUNTER — Encounter: Payer: Self-pay | Admitting: Family Medicine

## 2021-09-06 VITALS — BP 105/56 | HR 54 | Temp 97.4°F | Ht 61.0 in | Wt 152.0 lb

## 2021-09-06 DIAGNOSIS — Z0000 Encounter for general adult medical examination without abnormal findings: Secondary | ICD-10-CM

## 2021-09-06 DIAGNOSIS — I10 Essential (primary) hypertension: Secondary | ICD-10-CM

## 2021-09-06 DIAGNOSIS — Z1211 Encounter for screening for malignant neoplasm of colon: Secondary | ICD-10-CM

## 2021-09-06 DIAGNOSIS — E7800 Pure hypercholesterolemia, unspecified: Secondary | ICD-10-CM

## 2021-09-06 DIAGNOSIS — M810 Age-related osteoporosis without current pathological fracture: Secondary | ICD-10-CM | POA: Diagnosis not present

## 2021-09-06 LAB — COMPREHENSIVE METABOLIC PANEL
ALT: 3 U/L (ref 0–35)
AST: 18 U/L (ref 0–37)
Albumin: 4.1 g/dL (ref 3.5–5.2)
Alkaline Phosphatase: 62 U/L (ref 39–117)
BUN: 19 mg/dL (ref 6–23)
CO2: 32 mEq/L (ref 19–32)
Calcium: 9.9 mg/dL (ref 8.4–10.5)
Chloride: 105 mEq/L (ref 96–112)
Creatinine, Ser: 0.9 mg/dL (ref 0.40–1.20)
GFR: 60.42 mL/min (ref >60.00)
Glucose, Bld: 79 mg/dL (ref 70–99)
Potassium: 4.3 mEq/L (ref 3.5–5.1)
Sodium: 141 mEq/L (ref 135–145)
Total Bilirubin: 0.8 mg/dL (ref 0.2–1.2)
Total Protein: 6.4 g/dL (ref 6.0–8.3)

## 2021-09-06 LAB — LIPID PANEL
Cholesterol: 176 mg/dL (ref 0–200)
HDL: 68.2 mg/dL (ref >39.00)
LDL Cholesterol: 86 mg/dL (ref 0–99)
NonHDL: 107.6
Total CHOL/HDL Ratio: 3
Triglycerides: 110 mg/dL (ref 0.0–149.0)
VLDL: 22 mg/dL (ref 0.0–40.0)

## 2021-09-06 NOTE — Progress Notes (Signed)
Office Note 09/06/2021  CC:  Chief Complaint  Patient presents with   Follow-up    RCI; pt is fasting    HPI:  Patient is a 81 y.o. female who is here accompanied by her daughter for 6 mo f/u HTN, HLD, and osteoporosis.   A/P as of last visit: "1) HTN: well controlled on lopressor 25 bid. Lytes/cr today.   2) HLD: tolerating pravastatin 40 qd. LDL 65 fifteen months ago. FLP and hepatic panel today.   3) PAF: regular rhythm on flecainide, lopressor, and eliquis per cardiology. CBC and lytes/cr today.   4) Periph neuropathy (see PMH): sx's well controlled on gabapentin 600 mg bid--RFd today.   5) GERD: sx's ended up spontaneously resolved (pantop not used) after last visit.   6) Preventative health care: Immunizations: shingrix rx sent to pharmacy. She declines any further Tdap. Otherwise vaccines are UTD. Osteoporosis screening: she actually has had osteoporosis, I got her back on fosamax 11/2019 but I'm not sure if she ever took it.  Didn't discuss today, will discuss at next f/u visit. Breast ca screening: mammogram due 03/2021. Colon cancer screening: not discussed today.  Will discuss possible rpt cologuard vs switching to iFOB at next o/v. If she gets one of these and it's negative then no further colon ca screening indicated.  INTERIM HX: She is feeling well. No problems taking raloxifene-->October 2022 started raloxifene for her osteoporosis. No home blood pressure monitoring. Ports compliance with all her prescription medications.  Hyperlipidemia: LDL was up to about  100 last check, no med changes made.  ROS as above, plus--> no fevers, no CP, no SOB, no wheezing, no cough, no dizziness, no HAs, no rashes, no melena/hematochezia.  No polyuria or polydipsia.  No myalgias or arthralgias.  No focal weakness, paresthesias, or tremors.  No acute vision or hearing abnormalities.  No dysuria or unusual/new urinary urgency or frequency.  No recent changes in lower  legs. No n/v/d or abd pain.  Occ brief palpitations.     Past Medical History:  Diagnosis Date   Abnormal mammogram of left breast    Likely benign microcalcifications--repeat L diag mammo 03/24/2017.  COMPLEX SCLEROSING LESION WITH CALCIFICATIONS --no sign of malignancy.  Excision recommended--pt has been referred to Dr. Donne Hazel, who recommended f/u mammo and this was normal 12/2017--consider rpt 1 yr per rad.   Arthritis    DJD, back and both hips   Chronic low back pain without sciatica    Lumbar spondylosis + scoliosis.  Summer 2017 Dr. Nelva Bush did ESI and pt got no relief.  Pt then saw Dr. Maia Petties [facility]: felt she had facet mediated pain; plan for B L345 MBB (??).   Cystocele, midline    Diverticulosis    on colonoscopy 2004   GERD (gastroesophageal reflux disease)    Hx of cardiovascular stress test    Lexiscan Myoview (2/14):  Low risk, small ant defect likely breast attenuation, no ischemia, EF 69% (no change from 2010).     Hyperlipidemia    Hypertension    per pt   Idiopathic scoliosis of thoracolumbar region    Lumbar back pain    Mitral regurgitation    Echo (2/14):  EF 60-65%, Gr 1 DD, mild AI, mild bileaflet MVP, mod post directed MR, mild LAE, PASP 35.   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  Responding to gabapentin (Dr. Brett Fairy).   Osteoporosis, senile  stable left hip 2015 (took fosamax x 83yrs, T-score -2.7 when she stopped bisphosphonate).  Rpt DEXA 110mo later (09/2016) T-score -.3.1 (different machine, though). 11/2019 DEXA T-score -4. Recommended restart of fosamax   PAF (paroxysmal atrial fibrillation) (HCC)    Dr. Martin[place]: Flecainide, metopr, apxiaban.  08/2017-->persistent a-fib w/RVR, flecainide increased and she converted back to NSR and felt better.  100mg  bid flecainide continued.   Parkinson's disease ([place]) 2020 dx   started sinemet 05/2019 (Dr. Carles Collet)    Peripheral neuropathy 2008   Idiopathic vs familial (motor>sensory) vs lumbar disc dz: gabapentin helpful   Shoulder pain left   RC surg 09/2014    Past Surgical History:  Procedure Laterality Date   BREAST BIOPSY  04/10/2017   Left breast core needle biopsy of calcifications.  COMPLEX SCLEROSING LESION WITH CALCIFICATIONS --no sign of malignancy.  Excision recommended--pt has been referred to Dr. Donne Hazel, who recommended rpt mammo and this was NORMAL 12/2017.   BREAST EXCISIONAL BIOPSY Right 2010   Benign   BREAST SURGERY  Nov 2010   Benign biopsy, right   CHOLECYSTECTOMY  03/2010   Dr.Gross   COLONOSCOPY  10/03/2002   No polyps.  Repeat 10 yrs recommended but pt declines.  Pt declined cologuard 08/2016 but changed her mind and this test was NEG on 03/23/17.   COMBINED HYSTERECTOMY VAGINAL / OOPHORECTOMY / A&P REPAIR  1937   uncertain if ovaries were removed or not   CYSTOCELE REPAIR     DEXA  11/2019   T score -4   Lumpectomy  06/2009   "Fatty Necrosis"   PFT's  2014   NORMAL   REVERSE SHOULDER ARTHROPLASTY Right 03/11/2019   Procedure: REVERSE TOTAL SHOULDER ARTHROPLASTY;  Surgeon: Netta Cedars, MD;  Location: [facility];  Service: Orthopedics;  Laterality: Right;  Intersclene block   ROTATOR CUFF REPAIR Left 10/02/14   Dr. Veverly Fells   TONSILLECTOMY     TOTAL KNEE ARTHROPLASTY  09/2009   Right ;Dr Alvan Dame   TOTAL KNEE ARTHROPLASTY Left 03/10/2016   Procedure: TOTAL KNEE ARTHROPLASTY;  Surgeon: Paralee Cancel, MD;  Location: [facility];  Service: Orthopedics;  Laterality: Left;   TRANSTHORACIC ECHOCARDIOGRAM  09/2012; 09/27/15   2014: EF 60-65%, Gr 1 DD, mild AI, mild bileaflet MVP, mod post directed MR, mild LAE, PASP 35.  Repeat 2017: EF 55-60%, mild AR, mild MVP and mild MV regurg.   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   Orig from [place].   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   Caffiene, 1 cup daily avg.   No tob, no alc, no drugs.   Exercise: walking, limited by bilat hip pain.   Social Determinants of Health   Financial Resource Strain: Low Risk    Difficulty of Paying Living Expenses: Not hard at all  Food Insecurity: No Food Insecurity   Worried About Charity fundraiser in the Last Year: Never true   [place] in the Last Year: Never true  Transportation Needs: No Transportation Needs   Lack of Transportation (Medical): No   Lack of Transportation (Non-Medical): No  Physical Activity: Inactive   Days of Exercise per Week: 0 days   Minutes of Exercise per Session: 0 min  Stress: No Stress Concern Present   Feeling of Stress : Only a little  Social Connections: Moderately Integrated   Frequency of Communication with Friends and Family: More than three times a week   Frequency of Social Gatherings with Friends and Family: More than three times a week   Attends Religious Services: More than 4 times per year   Active Member of [employer] or Organizations: Yes   Attends Archivist  Meetings: More than 4 times per year   Marital Status: Widowed  Human resources officer Violence: Not At Risk   Fear of Current or Ex-Partner: No   Emotionally Abused: No   Physically Abused: No   Sexually Abused: No    Outpatient Medications Prior to Visit  Medication Sig Dispense Refill   Biotin 5000 MCG CAPS Take 5,000 mcg by mouth daily.     Calcium Carbonate (CALCIUM 600 PO) Take 600 mg by mouth 2 (two) times daily.      carbidopa-levodopa (SINEMET IR) 25-100 MG tablet Take 2 tablets by mouth 3 (three) times daily. 540 tablet 1   cyanocobalamin (,VITAMIN B-12,) 1000 MCG/ML injection INJECT 1 ML (1,000 MCG TOTAL) INTO THE MUSCLE EVERY 30 DAYS. 3 mL 3   ELIQUIS 5 MG TABS tablet TAKE 1 TABLET BY MOUTH TWICE A DAY 60 tablet 5   flecainide (TAMBOCOR) 100 MG tablet TAKE 1 TABLET BY MOUTH TWICE A DAY 180 tablet 0   gabapentin (NEURONTIN) 300 MG capsule 2 caps po bid 360 capsule 3   metoprolol tartrate (LOPRESSOR) 25 MG tablet Take 1 tablet (25 mg total) by mouth 2 (two) times daily. 180 tablet 3   Omega-3 Fatty Acids (FISH OIL) 1200 MG CAPS Take 1,000 mg by mouth 2 (two) times daily.      pravastatin (PRAVACHOL) 40 MG tablet TAKE 1 TABLET BY MOUTH EVERY DAY 90 tablet 3   raloxifene (EVISTA) 60 MG tablet Take 1 tablet (60 mg total) by mouth daily. 90 tablet 3   nitroGLYCERIN (NITROSTAT) 0.4 MG SL tablet Place 1 tablet (0.4 mg total) under the tongue every 5 (five) minutes as needed for chest pain. (Patient not taking: Reported on 09/06/2021) 15 tablet 1   HYDROcodone-acetaminophen (NORCO/VICODIN) 5-325 MG tablet Take 1-2 tablets by mouth every 6 (six) hours as needed for moderate pain. (Patient not taking: Reported on 09/06/2021) 30 tablet 0   No facility-administered medications prior to visit.    Allergies  Allergen Reactions   Sulfa Antibiotics Swelling    face  Tizanidine Other (See Comments)    delirium   PE; Vitals with BMI 09/06/2021 05/10/2021 04/25/2021  Height 5\' 1"  5\' 1"  -  Weight 152  lbs 153 lbs 6 oz 148 lbs  BMI 46.50 29 35.46  Systolic 568 127 517  Diastolic 56 63 70  Pulse 54 53 -   Gen: Alert, well appearing.  Patient is oriented to person, place, time, and situation. AFFECT: pleasant, lucid thought and speech. CV: RRR, no m/r/g.   LUNGS: CTA bilat, nonlabored resps, good aeration in all lung fields. EXT: no clubbing or cyanosis.  no edema.   Pertinent labs:  Lab Results  Component Value Date   TSH 1.32 04/21/2019   Lab Results  Component Value Date   WBC 5.7 02/26/2021   HGB 13.4 02/26/2021   HCT 40.5 02/26/2021   MCV 93.7 02/26/2021   PLT 157.0 02/26/2021   Lab Results  Component Value Date   CREATININE 0.81 02/26/2021   BUN 19 02/26/2021   NA 139 02/26/2021   K 4.5 02/26/2021   CL 104 02/26/2021   CO2 27 02/26/2021   Lab Results  Component Value Date   ALT 6 02/26/2021   AST 20 02/26/2021   ALKPHOS 64 02/26/2021   BILITOT 0.8 02/26/2021   Lab Results  Component Value Date   CHOL 194 02/26/2021   Lab Results  Component Value Date   HDL 66.90 02/26/2021   Lab Results  Component Value Date   LDLCALC 102 (H) 02/26/2021   Lab Results  Component Value Date   TRIG 128.0 02/26/2021   Lab Results  Component Value Date   CHOLHDL 3 02/26/2021   Lab Results  Component Value Date   HGBA1C  09/19/2010    5.3 (NOTE)                                                                       According to the ADA Clinical Practice Recommendations for 2011, when HbA1c is used as a screening test:   >=6.5%   Diagnostic of Diabetes Mellitus           (if abnormal result  is confirmed)  5.7-6.4%   Increased risk of developing Diabetes Mellitus  References:Diagnosis and Classification of Diabetes Mellitus,Diabetes Care,2011,34(Suppl 1):S62-S69 and Standards of Medical Care in         Diabetes - 2011,Diabetes [id number],4944,96  (Suppl 1):S11-S61.   ASSESSMENT AND PLAN:   #1 hypertension.  Well-controlled.  Continue Lopressor 25 mg twice a day.  2.   Hyperlipidemia.  Pravastatin 40 mg a day. LDL was 102 about 6 months ago. Fasting lipids and hepatic panel today.  3.  Osteoporosis.  October 2022 started raloxifene for her osteoporosis. Tolerating this well.  Plan DEXA in 1 year.  4. Preventative health care: Immunizations: She declines any further Tdap. Otherwise vaccines are UTD. Breast ca screening: mammogram due 07/2022. Colon cancer screening: Discussed today.  Will do Cologuard--she is aware that she would need a colonoscopy if this is positive.  If negative then no further colon cancer screening indicated.  5.  PAF--flecainide and Eliquis per cardiology. She will arrange follow-up with them soon.  An After Visit Summary was printed and given to the patient.  FOLLOW UP:  Return in about 6 months (around 03/06/2022) for routine chronic illness f/u.  Signed:  Crissie Sickles, MD           09/06/2021

## 2021-09-12 ENCOUNTER — Other Ambulatory Visit: Payer: Self-pay | Admitting: Cardiology

## 2021-09-12 NOTE — Telephone Encounter (Signed)
Prescription refill request for Eliquis received. Indication:Afib Last office visit:Needs appointment Scr:0.9 Age: 81 Weight:68.9 kg  Prescription refilled

## 2021-10-04 ENCOUNTER — Other Ambulatory Visit: Payer: Self-pay | Admitting: Cardiology

## 2021-10-08 DIAGNOSIS — Z1211 Encounter for screening for malignant neoplasm of colon: Secondary | ICD-10-CM | POA: Diagnosis not present

## 2021-10-09 NOTE — Progress Notes (Signed)
? ? ?Assessment/Plan:  ? ?1.  Parkinsons Disease ? -Continue carbidopa/levodopa 25/100, 2  tablet 3 times per day. ? -She was somewhat resistant to physical therapy, but ultimately agreeable ? -We discussed that it used to be thought that levodopa would increase risk of melanoma but now it is believed that Parkinsons itself likely increases risk of melanoma. she is to get regular skin checks.  She went to dermatology in the last month. ? ?2.  Insomnia ? -add trazodone, 50 mg q hs.  Risk, benefits, side effects discussed ? ? ?Subjective:  ? ?Cassie White was seen today in follow up for Parkinsons disease.  My previous records were reviewed prior to todays visit as well as outside records available to me.  The patient with daughter who supplements the hx.   patient seen on video last visit, at which time we slightly increased her levodopa.  She reports that she is doing okay with that, but complains of being tremulous on the right.   No SE.  Has had some falls.  "I can't keep my balance."  With one fall, she was coming down 2 steps from trailer and fell.  With one, she was trying to get out of the car and fell.  With one, she fell and tripped over a rug at church (had walker).  With the 3 falls, she had her walker with one of them.   Last saw primary care February 3.  Notes are reviewed.  She is having trouble sleeping at bed.  Was taking melatonin without relief. ? ?Current prescribed movement disorder medications: ?Carbidopa/levodopa 25/100, 2 tablets 3 times per day (increased last visit) ? ? ?PREVIOUS MEDICATIONS: Sinemet ? ?ALLERGIES:   ?Allergies  ?Allergen Reactions  ? Sulfa Antibiotics Swelling  ?  face  ? Tizanidine Other (See Comments)  ?  delirium  ? ? ?CURRENT MEDICATIONS:  ?Outpatient Encounter Medications as of 10/10/2021  ?Medication Sig  ? apixaban (ELIQUIS) 5 MG TABS tablet TAKE 1 TABLET BY MOUTH TWICE A DAY  ? Biotin 5000 MCG CAPS Take 5,000 mcg by mouth daily.  ? Calcium Carbonate (CALCIUM 600 PO)  Take 600 mg by mouth 2 (two) times daily.   ? carbidopa-levodopa (SINEMET IR) 25-100 MG tablet Take 2 tablets by mouth 3 (three) times daily.  ? cyanocobalamin (,VITAMIN B-12,) 1000 MCG/ML injection INJECT 1 ML (1,000 MCG TOTAL) INTO THE MUSCLE EVERY 30 DAYS.  ? flecainide (TAMBOCOR) 100 MG tablet TAKE 1 TABLET BY MOUTH TWICE A DAY  ? gabapentin (NEURONTIN) 300 MG capsule 2 caps po bid  ? metoprolol tartrate (LOPRESSOR) 25 MG tablet Take 1 tablet (25 mg total) by mouth 2 (two) times daily.  ? Omega-3 Fatty Acids (FISH OIL) 1200 MG CAPS Take 1,000 mg by mouth 2 (two) times daily.   ? pravastatin (PRAVACHOL) 40 MG tablet TAKE 1 TABLET BY MOUTH EVERY DAY  ? traZODone (DESYREL) 50 MG tablet Take 1 tablet (50 mg total) by mouth at bedtime.  ? nitroGLYCERIN (NITROSTAT) 0.4 MG SL tablet Place 1 tablet (0.4 mg total) under the tongue every 5 (five) minutes as needed for chest pain. (Patient not taking: Reported on 09/06/2021)  ? raloxifene (EVISTA) 60 MG tablet Take 1 tablet (60 mg total) by mouth daily. (Patient not taking: Reported on 10/10/2021)  ? [DISCONTINUED] apixaban (ELIQUIS) 5 MG TABS tablet TAKE 1 TABLET BY MOUTH TWICE A DAY  ? ?No facility-administered encounter medications on file as of 10/10/2021.  ? ? ?Objective:  ? ?PHYSICAL  EXAMINATION:   ? ?VITALS:   ?Vitals:  ? 10/10/21 1115  ?BP: 132/63  ?Pulse: 65  ?SpO2: 98%  ?Weight: 139 lb (63 kg)  ?Height: '5\' 1"'$  (1.549 m)  ? ? ? ?GEN:  The patient appears stated age and is in NAD. ?HEENT:  Normocephalic, atraumatic.  The mucous membranes are moist. The superficial temporal arteries are without ropiness or tenderness. ?CV:  Brady.  regular ?Lungs:  CTAB ?Neck/HEME:  There are no carotid bruits bilaterally. ? ?Neurological examination: ? ?Orientation: The patient is alert and oriented x3. ?Cranial nerves: There is good facial symmetry with min facial hypomimia. The speech is fluent and clear. Soft palate rises symmetrically and there is no tongue deviation. Hearing is  intact to conversational tone. ?Sensation: Sensation is intact to light touch throughout ?Motor: Strength is at least antigravity x4. ? ?Movement examination: ?Tone: There is nl tone in the UE/LE ?Abnormal movements: none ?Coordination:  There is no decremation with RAM's, with any form of RAMS, including alternating supination and pronation of the forearm, hand opening and closing, finger taps, heel taps and toe taps. ?Gait and Station: The patient has min difficulty arising out of a deep-seated chair without the use of the hands. The patient is antalgic.  She is forward flexed. ? ?I have reviewed and interpreted the following labs independently ? ?  Chemistry   ?   ?Component Value Date/Time  ? NA 141 09/06/2021 1125  ? NA 145 (H) 06/02/2018 1514  ? K 4.3 09/06/2021 1125  ? CL 105 09/06/2021 1125  ? CO2 32 09/06/2021 1125  ? BUN 19 09/06/2021 1125  ? BUN 18 06/02/2018 1514  ? CREATININE 0.90 09/06/2021 1125  ?    ?Component Value Date/Time  ? CALCIUM 9.9 09/06/2021 1125  ? ALKPHOS 62 09/06/2021 1125  ? AST 18 09/06/2021 1125  ? ALT 3 09/06/2021 1125  ? BILITOT 0.8 09/06/2021 1125  ?  ? ? ? ?Lab Results  ?Component Value Date  ? WBC 5.7 02/26/2021  ? HGB 13.4 02/26/2021  ? HCT 40.5 02/26/2021  ? MCV 93.7 02/26/2021  ? PLT 157.0 02/26/2021  ? ? ?Lab Results  ?Component Value Date  ? TSH 1.32 04/21/2019  ? ? ? ?Total time spent on today's visit was 21 minutes, including both face-to-face time and nonface-to-face time.  Time included that spent on review of records (prior notes available to me/labs/imaging if pertinent), discussing treatment and goals, answering patient's questions and coordinating care. ? ?Cc:  [name], Adrian Blackwater, MD ? ?

## 2021-10-10 ENCOUNTER — Other Ambulatory Visit: Payer: Self-pay | Admitting: Cardiology

## 2021-10-10 ENCOUNTER — Encounter: Payer: Self-pay | Admitting: Neurology

## 2021-10-10 ENCOUNTER — Ambulatory Visit: Payer: Medicare Other | Admitting: Neurology

## 2021-10-10 ENCOUNTER — Other Ambulatory Visit: Payer: Self-pay

## 2021-10-10 VITALS — BP 132/63 | HR 65 | Ht 61.0 in | Wt 139.0 lb

## 2021-10-10 DIAGNOSIS — G20 Parkinson's disease: Secondary | ICD-10-CM | POA: Diagnosis not present

## 2021-10-10 MED ORDER — TRAZODONE HCL 50 MG PO TABS: 50.0000 mg | ORAL_TABLET | Freq: Every day | ORAL | 1 refills | Status: DC

## 2021-10-10 NOTE — Telephone Encounter (Signed)
Prescription refill request for Eliquis received. ?Indication:Afib ?Last office visit:upcoming ?Scr:0.9 ?Age: 81 ?Weight:68.9 kg ? ?Prescription refilled ? ?

## 2021-10-10 NOTE — Patient Instructions (Signed)
Start trazodone 50 mg at bed.  Local and Online Resources for Power over Parkinson's Group March 2023  LOCAL [place] PARKINSON'S GROUPS  Power over Parkinson's Group :   Power Over Parkinson's Patient Education Group will be Wednesday, March 8th-*Hybrid meting*- in person at [place] location and via [facility] at 2:00 pm.   Upcoming Power over Parkinson's Meetings:  2nd Wednesdays of the month at 2 pm:  March 8th, April 12th Contact Amy Marriott at [email or web address] if interested in participating in this group Parkinson's Care Partners Group:    3rd Mondays, Contact Misty Paladino Atypical Parkinsonian Patient Group:   4th Wednesdays, [place] If you are interested in participating in these groups with Misty, please contact her directly for how to join those meetings.  Her contact information is [email or web address].    LOCAL EVENTS AND NEW OFFERINGS Parkinson's Wellness Event:  Pottery Night at [facility] [place].  Friday, March 10th 5:30-7:30 pm.  Sponsored by [employer] [place].  FREE event for people with Parkinson's and care partners.  RSVP to [facility] at [email or web address] Dine out at [facility].  Celebrate Parkinson's disease Awareness Month and Support the Parkinson's Movement Disorder Fund.   Wednesday, April 19th 4-6 pm at [place], [employer].  (Give receipt to cashier and 20% will be donated) Parkinson's T-shirts for sale!  Designed by a local group member, with funds going to [place].  $20.00  [place] to purchase (see email above) New PWR! Moves Class offering at [employer]!  Fridays 1-2 pm in March (likely to switch days and times after March).  Come try it out and see if PWR! Moves is a good fit for your exercise routine!  Contact Amy Marriott for details:  [email or web address] [place] Challenge Bike, Run, Walk for PD, PSP, MSA.    Saturday, April 8th at 8 am.  [place]  Proceeds go to offset costs of exercise programs locally.  To Register, visit website:  www.[email or web address]  [place]:  www.parkinson.org PD Health at Home continues:  Mindfulness Mondays, Expert Briefing Tuesdays, Wellness Wednesdays, Take Time Thursdays, Fitness Fridays  Upcoming Education:  Parkinson's and Medications:  [employer].  Wednesday, March 8th at 1:00 pm. Freezing and Fall Prevention in Parkinson's.  Wednesday, April 12th at 1:00 pm Register for expert briefings Cytogeneticist) at WatchCalls.si [place] Chapter Parkinson's Symposium: Cognition Changes- a free in-person ([place], [place]) and online [employer]) event for people with Parkinson's and their loved ones. During this event we will explore new treatments and practical strategies for addressing these changes.  Saturday, April 1st, 10 am-1 pm.  Register at [email or web address]/ or contact [place] at [phone] or [email or web address].  Please check out their website to sign up for emails and see their full online offerings  [place]:  [email or web address]  Third Thursday Webinars:  On the third Thursday of every month at 12 p.m. ET, join our free live webinars to learn about various aspects of living with Parkinson's disease and our work to speed medical breakthroughs. Upcoming Webinar:  The Power of Women in Philanthropy.  Tues, March 28th at  12 pm. Check out additional information on their website to see their full online offerings  [employer]:  www.[email or web address] Upcoming Webinar:   Stay tuned Webinar Series:  Living with Parkinson's Meetup.   Third Thursdays of each month at 3 pm Care Partner Monthly Meetup.  With Robin Searing Phinney.  First Tuesday of each  month, 2 pm Check out additional information to Live  Well Today on their website  Parkinson and Movement Disorders (PMD) Alliance:  www.pmdalliance.org [employer]Life Online:  Online Education Events Sign up for emails, which are sent weekly to give you updates on programming and online offerings  Parkinson's Association of the [place]:  www.parkinsonassociation.org Information on online support groups, education events, and online exercises including Yoga, Parkinson's exercises and more-LOTS of information on links to PD resources and online events Virtual Support Group through Parkinson's Association of the [place]; next one is scheduled for Wednesday, *April 5th at 2 pm. *March meeting has been cancelled. (These are typically scheduled for the 1st Wednesday of the month at 2 pm).  Visit website for details. MOVEMENT AND EXERCISE OPPORTUNITIES Parkinson's DRUMMING Classes/Music Therapy with Doylene Canning:  This is a returning class and it's FREE!  2nd Mondays, continuing March 13th , 11:00 at the [place].  Contact *Misty Taylor-Paladino at [email or web address] or Doylene Canning at [phone] or [email or web address]  PWR! Moves Classes at [place].  Wednesdays 10 and 11 am.   NEW PWR! Moves Class offering at [employer].  Fridays 1-2 pm.  Contact Amy Marriott, PT [email or web address] if interested. Here is a link to the PWR!Moves classes on Zoom from [place] - Daily Mon-Sat at 10:00. Via Zoom, FREE and open to all.  There is also a link below via Facebook if you use that platform. AptDealers.si [email or web address]  Parkinson's Wellness Recovery (PWR! Moves)  www.pwr4life.org Info on the PWR! Virtual Experience:  You will have access to our  expertise through self-assessment, guided plans that start with the PD-specific fundamentals, educational content, tips, Q&A with an expert, and a growing Art therapist of PD-specific pre-recorded and live exercise classes of varying types and intensity - both physical and cognitive! If that is not enough, we offer 1:1 wellness consultations (in-person or virtual) to personalize your PWR! Research scientist (medical).  [place] Fridays:  As part of the PD Health @ Home program, this free video series focuses each week on one aspect of fitness designed to support people living with Parkinson's.  These weekly videos highlight the [place] recent fitness guidelines for people with Parkinson's disease. ModemGamers.si Dance for PD website is offering free, live-stream classes throughout the week, as well as links to [employer] of classes:  https://[email or web address].[email or web address]/ Dance for Parkinson's in-person class.  February 1-April 26, Wednesdays 4-5 pm.  Free class for people with Parkinson's disease, at [address], [place], [place].  Contact [phone] or [email or web address] to register Virtual dance and Pilates for Parkinson's classes: Click on the Community Tab> Parkinson's Movement Initiative Tab.  To register for classes and for more information, visit www.[email or web address] and click the community tab.  [employer] Parkinson's Cycling Classes  [employer]:  Thursdays @ Noon-Live classes at [employer] ([employer] at [email or web address] or [phone]) [employer]: Virtual Classes Mondays and Thursdays Jeanette Caprice classes Tuesday, Wednesday and Thursday (contact [place] at [email or web address]  or [phone]) [place] Varied levels of classes are offered Mondays, Tuesdays and Thursdays at [employer].  Stretching with Verdis Frederickson weekly class is also offered  for people with Parkinson's To observe a class or for more information, call [phone] or email Hezzie Bump at [email or web address] ADDITIONAL SUPPORT AND RESOURCES Well-Spring Solutions:Online Caregiver Education Opportunities:  www.well-springsolutions.org/caregiver-education/caregiver-support-group.  You may also contact Vickki Muff at [email or web address] or [phone].    Well-Spring Navigator:  [phone] program, a free service to  help individuals and families through the journey of determining care for older adults.  The Navigator is a Education officer, museum, Arnell Asal, who will speak with a prospective client and/or loved ones to provide an assessment of the situation and a set of recommendations for a personalized care plan -- all free of charge, and whether Well-Spring Solutions offers the needed service or not. If the need is not a service we provide, we are well-connected with reputable programs in town that we can refer you to.  www.well-springsolutions.org or to speak with the Navigator, call [phone]

## 2021-10-17 ENCOUNTER — Telehealth: Payer: Self-pay

## 2021-10-17 ENCOUNTER — Ambulatory Visit: Payer: Medicare Other | Attending: Neurology

## 2021-10-17 ENCOUNTER — Other Ambulatory Visit: Payer: Self-pay

## 2021-10-17 DIAGNOSIS — G20 Parkinson's disease: Secondary | ICD-10-CM | POA: Insufficient documentation

## 2021-10-17 DIAGNOSIS — M6281 Muscle weakness (generalized): Secondary | ICD-10-CM | POA: Insufficient documentation

## 2021-10-17 DIAGNOSIS — Z9181 History of falling: Secondary | ICD-10-CM | POA: Insufficient documentation

## 2021-10-17 LAB — COLOGUARD: COLOGUARD: NEGATIVE

## 2021-10-17 NOTE — Telephone Encounter (Signed)
Patient calling about test results - cologuard test. ?Please call patient whenever available. ?

## 2021-10-17 NOTE — Therapy (Signed)
[facility] ?[address] ?[place], [place], [address] ?Phone: [phone]   Fax:  [phone] ? ?Physical Therapy Evaluation ? ?Patient Details  ?Name: Cassie White ?MRN: [id number] ?Date of Birth: Mar 19, 1941 ?Referring Provider (PT): Alonza Bogus, DO ? ? ?Encounter Date: 10/17/2021 ? ? PT End of Session - 10/17/21 1115   ? ? Visit Number 1   ? Number of Visits 14   ? Date for PT Re-Evaluation 12/27/21   ? PT Start Time 1115   ? PT Stop Time 6283   ? PT Time Calculation (min) 43 min   ? Activity Tolerance Patient tolerated treatment well   ? Behavior During Therapy [facility] for tasks assessed/performed   ? ?  ?  ? ?  ? ? ?Past Medical History:  ?Diagnosis Date  ? Abnormal mammogram of left breast   ? Likely benign microcalcifications--repeat L diag mammo 03/24/2017.  COMPLEX SCLEROSING LESION WITH CALCIFICATIONS --no sign of malignancy.  Excision recommended--pt has been referred to Dr. Donne Hazel, who recommended f/u mammo and this was normal 12/2017--consider rpt 1 yr per rad.  ? Arthritis   ? DJD, back and both hips  ? Chronic low back pain without sciatica   ? Lumbar spondylosis + scoliosis.  Summer 2017 Dr. Nelva Bush did ESI and pt got no relief.  Pt then saw Dr. Maia Petties [facility]: felt she had facet mediated pain; plan for B L345 MBB (??).  ? Cystocele, midline   ? Diverticulosis   ? on colonoscopy 2004  ? GERD (gastroesophageal reflux disease)   ? Hx of cardiovascular stress test   ? Lexiscan Myoview (2/14):  Low risk, small ant defect likely breast attenuation, no ischemia, EF 69% (no change from 2010).    ? Hyperlipidemia   ? Hypertension   ? per pt  ? Idiopathic scoliosis of thoracolumbar region   ? Lumbar back pain   ? Mitral regurgitation   ? Echo (2/14):  EF 60-65%, Gr 1 DD, mild AI, mild bileaflet MVP, mod post directed MR, mild LAE, PASP 35.  ? MVP (mitral valve prolapse)   ? Neuropathy 2019  ? 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  Responding to gabapentin (Dr. Brett Fairy).  ? Osteoporosis, senile   ? stable left hip 2015 (took fosamax x 29yr, T-score -2.7 when she stopped bisphosphonate).  Rpt DEXA 211moater (09/2016) T-score -.3.1 (different machine, though). 11/2019 DEXA T-score -4. Recommended restart of fosamax  ? PAF ([place]oxysmal atrial fibrillation) ([place]  ? Dr. JoMartiniqueFlecainide, metopr, apxiaban.  08/2017-->persistent a-fib w/RVR, flecainide increased and she converted back to NSR and felt better.  '100mg'$  bid flecainide continued.  ? Parkinson's disease (HCBrillion2020 dx  ? started sinemet 05/2019 (Dr. TaCarles Collet ? Peripheral neuropathy 2008  ? Idiopathic vs familial (motor>sensory) vs lumbar disc dz: gabapentin helpful  ? Shoulder pain left  ? RC surg 09/2014  ? ? ?Past Surgical History:  ?Procedure Laterality Date  ? BREAST BIOPSY  04/10/2017  ? Left breast core needle biopsy of calcifications.  COMPLEX SCLEROSING LESION WITH CALCIFICATIONS --no sign of malignancy.  Excision recommended--pt has been referred to Dr. WaDonne Hazelwho recommended rpt mammo and this was NORMAL 12/2017.  ? BREAST EXCISIONAL BIOPSY Right 2010  ? Benign  ? BREAST SURGERY  Nov 2010  ? Benign biopsy, right  ? CHOLECYSTECTOMY  03/2010  ? Dr.Gross  ? COLONOSCOPY  10/03/2002  ? No polyps.  Repeat 10 yrs recommended but pt declines.  Pt declined cologuard 08/2016 but changed her mind and this test was NEG on 03/23/17.  ? COMBINED HYSTERECTOMY VAGINAL / OOPHORECTOMY / A&P REPAIR  1610  ? uncertain if ovaries were removed or not  ? CYSTOCELE REPAIR    ? DEXA  11/2019  ? T score -4  ? Lumpectomy  06/2009  ? "Fatty Necrosis"  ? PFT's  2014  ? NORMAL  ? REVERSE SHOULDER ARTHROPLASTY Right 03/11/2019  ? Procedure: REVERSE TOTAL SHOULDER ARTHROPLASTY;  Surgeon: Netta Cedars, MD;  Location: [facility];  Service: Orthopedics;  Laterality: Right;  Intersclene block  ? ROTATOR CUFF REPAIR Left 10/02/14  ? Dr. Veverly Fells  ? TONSILLECTOMY    ? TOTAL KNEE ARTHROPLASTY  09/2009  ? Right  ;Dr Alvan Dame  ? TOTAL KNEE ARTHROPLASTY Left 03/10/2016  ? Procedure: TOTAL KNEE ARTHROPLASTY;  Surgeon: Paralee Cancel, MD;  Location: [facility];  Service: Orthopedics;  Laterality: Left;  ? TRANSTHORACIC ECHOCARDIOGRAM  09/2012; 09/27/15  ? 2014: EF 60-65%, Gr 1 DD, mild AI, mild bileaflet MVP, mod post directed MR, mild LAE, PASP 35.  Repeat 2017: EF 55-60%, mild AR, mild MVP and mild MV regurg.  ? VAGINAL HYSTERECTOMY    ? For Uterine Deviation   ? ? ?There were no vitals filed for this visit. ? ? ? Subjective Assessment - 10/17/21 1115   ? ? Subjective Patient reports that she has fallen about 3 times in the last few months. The first fall was at her sister in-laws house as she was walking down her steps when she reached out to grab someones hand and then lost her balance and fell on her back and hit her head on the concrete. The second, she was moving from her sisters car to her car when her feet got tangled up and fell on the concrete. The most recent was about 3 weeks ago at church, she tripped over a rugs. She is using a rollator to get around.   ? Pertinent History Parkinson's Disease, low back pain, bilateral TKA's,   ? How long can you walk comfortably? 10-15 minutes prior to haing to rest   ? Patient Stated Goals increase lower extremity strength and reduce her risk of falling   ? Currently in Pain? No/denies   ? ?  ?  ? ?  ? ? ? ? ? [facility] PT Assessment - 10/17/21 0001   ? ?  ? Assessment  ? Medical Diagnosis Parkinson's Disease   ? Referring Provider (PT) Alonza Bogus, DO   ? Onset Date/Surgical Date --   3 years ago  ? Next MD Visit 6 months   ? Prior Therapy yes   ?  ? Precautions  ? Precautions Fall   ?  ? Restrictions  ? Weight Bearing Restrictions No   ?  ? Balance Screen  ? Has the patient fallen in the past 6 months Yes   ? How many times? 3+   ? Has the patient had a decrease in activity level because of a fear of falling?  Yes   ? Is the patient reluctant to leave their home because of a fear of falling?  No    ?  ? Home Environment  ? Living Environment Private residence   ? Living Arrangements Children   ? Home Access Ramped entrance   ? Home Layout Two level   ? Alternate Level Stairs-Number of Steps --   Does not go upstairs  ? Home Equipment Walker - 4 wheels;Grab bars -  tub/shower   ?  ? Prior Function  ? Level of Independence Independent with household mobility with device   ? Leisure read, watch tv   ?  ? Cognition  ? Overall Cognitive Status Within Functional Limits for tasks assessed   ? Attention Focused   ? Focused Attention Appears intact   ? Memory Appears intact   ? Awareness Appears intact   ? Problem Solving Appears intact   ?  ? Sensation  ? Additional Comments Patient reports no numbness or tingling   ?  ? Posture/Postural Control  ? Posture/Postural Control Postural limitations   ? Postural Limitations Rounded Shoulders;Forward head;Flexed trunk   ?  ? ROM / Strength  ? AROM / PROM / Strength Strength   ?  ? Strength  ? Strength Assessment Site Hip;Knee;Ankle   ? Right/Left Hip Left;Right   ? Right Hip Flexion 4-/5   ? Left Hip Flexion 4-/5   ? Right/Left Knee Right;Left   ? Right Knee Flexion 4+/5   ? Right Knee Extension 4+/5   ? Left Knee Flexion 4/5   ? Left Knee Extension 4/5   ? Right/Left Ankle Left;Right   ? Right Ankle Dorsiflexion 4-/5   ? Left Ankle Dorsiflexion 4-/5   ?  ? Transfers  ? Transfers Sit to Stand;Stand to Sit   ? Sit to Stand 6: Modified independent (Device/Increase time)   ? Five time sit to stand comments  21.5 seconds   ? Stand to Sit 6: Modified independent (Device/Increase time)   ?  ? Ambulation/Gait  ? Ambulation/Gait Yes   ? Ambulation/Gait Assistance 6: Modified independent (Device/Increase time)   ? Assistive device 4-wheeled walker   able to walk short distances without assistnce  ? Gait Pattern Shuffle;Trunk flexed   ? Ambulation Surface Level;Indoor   ? Gait Comments Pace increased as she continued walking with increased trunk flexion   ?  ? Standardized Balance  Assessment  ? Standardized Balance Assessment Five Times Sit to Stand;Timed Up and Go Test   ? Five times sit to stand comments  21.5 seconds   ?  ? Timed Up and Go Test  ? TUG Normal TUG   with rolling wal

## 2021-10-17 NOTE — Telephone Encounter (Signed)
Spoke with pt regarding results/recommendations,voiced understanding. ? ?

## 2021-10-21 ENCOUNTER — Ambulatory Visit: Payer: Medicare Other

## 2021-10-21 ENCOUNTER — Other Ambulatory Visit: Payer: Self-pay

## 2021-10-21 DIAGNOSIS — M6281 Muscle weakness (generalized): Secondary | ICD-10-CM

## 2021-10-21 DIAGNOSIS — Z9181 History of falling: Secondary | ICD-10-CM

## 2021-10-21 DIAGNOSIS — G20 Parkinson's disease: Secondary | ICD-10-CM | POA: Diagnosis not present

## 2021-10-21 NOTE — Therapy (Signed)
[place] ?[facility] ?[place] ?[place], [place], [address] ?Phone: [phone]   Fax:  [phone] ? ?Physical Therapy Treatment ? ?Patient Details  ?Name: Cassie White ?MRN: [id number] ?Date of Birth: [birth year] ?Referring Provider (PT): Alonza Bogus, DO ? ? ?Encounter Date: 10/21/2021 ? ? PT End of Session - 10/21/21 0905   ? ? Visit Number 2   ? Number of Visits 14   ? Date for PT Re-Evaluation 12/27/21   ? PT Start Time 0900   ? PT Stop Time 4259   ? PT Time Calculation (min) 45 min   ? Activity Tolerance Patient tolerated treatment well   ? Behavior During Therapy [facility] for tasks assessed/performed   ? ?  ?  ? ?  ? ? ?Past Medical History:  ?Diagnosis Date  ? Abnormal mammogram of left breast   ? Likely benign microcalcifications--repeat L diag mammo 03/24/2017.  COMPLEX SCLEROSING LESION WITH CALCIFICATIONS --no sign of malignancy.  Excision recommended--pt has been referred to Dr. Donne Hazel, who recommended f/u mammo and this was normal 12/2017--consider rpt 1 yr per rad.  ? Arthritis   ? DJD, back and both hips  ? Chronic low back pain without sciatica   ? Lumbar spondylosis + scoliosis.  Summer 2017 Dr. Nelva Bush did ESI and pt got no relief.  Pt then saw Dr. Maia [facility]ties [facility]: felt she had facet mediated pain; plan for B L345 MBB (??).  ? Cystocele, midline   ? Diverticulosis   ? on colonoscopy 2004  ? GERD (gastroesophageal reflux disease)   ? Hx of cardiovascular stress test   ? Lexiscan Myoview (2/14):  Low risk, small ant defect likely breast attenuation, no ischemia, EF 69% (no change from 2010).    ? Hyperlipidemia   ? Hypertension   ? per pt  ? Idiopathic scoliosis of thoracolumbar region   ? Lumbar back pain   ? Mitral regurgitation   ? Echo (2/14):  EF 60-65%, Gr 1 DD, mild AI, mild bileaflet MVP, mod post directed MR, mild LAE, PASP 35.  ? MVP (mitral valve prolapse)   ? Neuropathy 2019  ? 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  Responding to gabapentin (Dr. Brett Fairy).  ? Osteoporosis, senile   ? stable left hip 2015 (took fosamax x 73yr, T-score -2.7 when she stopped bisphosphonate).  Rpt DEXA 267moater (09/2016) T-score -.3.1 (different machine, though). 11/2019 DEXA T-score -4. Recommended restart of fosamax  ? PAF (paroxysmal atrial fibrillation) ([facility]  ? Dr. JoMartiniqueFlecainide, metopr, apxiaban.  08/2017-->persistent a-fib w/RVR, flecainide increased and she converted back to NSR and felt better.  '100mg'$  bid flecainide continued.  ? [place]  ? started sinemet 05/2019 (Dr. TaCarles Collet ? Peripheral neuropathy 2008  ? Idiopathic vs familial (motor>sensory) vs lumbar disc dz: gabapentin helpful  ? Shoulder pain left  ? RC surg 09/2014  ? ? ?Past Surgical History:  ?Procedure Laterality Date  ? BREAST BIOPSY  04/10/2017  ? Left breast core needle biopsy of calcifications.  COMPLEX SCLEROSING LESION WITH CALCIFICATIONS --no sign of malignancy.  Excision recommended--pt has been referred to Dr. WaDonne Hazelwho recommended rpt mammo and this was NORMAL 12/2017.  ? BREAST EXCISIONAL BIOPSY Right 2010  ? Benign  ? BREAST SURGERY  Nov 2010  ? Benign biopsy, right  ? CHOLECYSTECTOMY  03/2010  ? Dr.Gross  ? COLONOSCOPY  10/03/2002  ? No polyps.  Repeat 10 yrs recommended but pt declines.  Pt declined cologuard 08/2016 but changed her mind and this test was NEG on 03/23/17.  ? COMBINED HYSTERECTOMY VAGINAL / OOPHORECTOMY / A&P REPAIR  6237  ? uncertain if ovaries were removed or not  ? CYSTOCELE REPAIR    ? DEXA  11/2019  ? T score -4  ? Lumpectomy  06/2009  ? "Fatty Necrosis"  ? PFT's  2014  ? NORMAL  ? REVERSE SHOULDER ARTHROPLASTY Right 03/11/2019  ? Procedure: REVERSE TOTAL SHOULDER ARTHROPLASTY;  Surgeon: Netta Cedars, MD;  Location: [facility];  Service: Orthopedics;  Laterality: Right;  Intersclene block  ? ROTATOR CUFF REPAIR Left 10/02/14  ? Dr. Veverly Fells  ? TONSILLECTOMY    ? TOTAL KNEE ARTHROPLASTY  09/2009  ? Right  ;Dr Alvan Dame  ? TOTAL KNEE ARTHROPLASTY Left 03/10/2016  ? Procedure: TOTAL KNEE ARTHROPLASTY;  Surgeon: Paralee Cancel, MD;  Location: [facility];  Service: Orthopedics;  Laterality: Left;  ? TRANSTHORACIC ECHOCARDIOGRAM  09/2012; 09/27/15  ? 2014: EF 60-65%, Gr 1 DD, mild AI, mild bileaflet MVP, mod post directed MR, mild LAE, PASP 35.  Repeat 2017: EF 55-60%, mild AR, mild MVP and mild MV regurg.  ? VAGINAL HYSTERECTOMY    ? For Uterine Deviation   ? ? ?There were no vitals filed for this visit. ? ? Subjective Assessment - 10/21/21 0904   ? ? Subjective Patient reports that she feels alright today. She notes that she has not had any problems since her last appointment.   ? Pertinent History Parkinson's Disease, low back pain, bilateral TKA's,   ? How long can you walk comfortably? 10-15 minutes prior to haing to rest   ? Patient Stated Goals increase lower extremity strength and reduce her risk of falling   ? Currently in Pain? No/denies   ? ?  ?  ? ?  ? ? ? ? ? ? ? ? ? ? ? ? ? ? ? ? ? ? ? ? [facility] Treatment/Exercise - 10/21/21 0001   ? ?  ? Exercises  ? Exercises Knee/Hip   ?  ? Lumbar Exercises: Seated  ? Other Seated Lumbar Exercises Slouch overcorrect   2 minutes  ?  ? Knee/Hip Exercises: Aerobic  ? Nustep L3 x 15 minutes   ?  ? Knee/Hip Exercises: Seated  ? Long [employer] Both;20 reps;Weights   ? Long Arc Quad Weight 2 lbs.   ? Other Seated Knee/Hip Exercises Heel/toe raises   ? Marching Both   3 minutes  ? ?  ?  ? ?  ? ? ? ? ? ? Balance Exercises - 10/21/21 0001   ? ?  ? Balance Exercises: Standing  ? Sidestepping Upper extremity support   2 minutes  ? Marching Foam/compliant surface;Upper extremity assist 2;Static   3 minutes  ? ?  ?  ? ?  ? ? ? ? ? ? ? ? ? ? PT Long Term Goals - 10/17/21 1723   ? ?  ? PT LONG TERM GOAL #1  ? Title Patient will be independent with her HEP.   ? Time 6   ? Period Weeks   ? Status New   ? Target Date 11/28/21   ?  ? PT LONG TERM GOAL #2  ? Title Patient will improve her TUG time to  13 seconds or less to reduce her risk of falls.   ? Time 6   ? Period Weeks   ? Status New   ? Target Date 11/28/21   ?  ?  PT LONG TERM GOAL #3  ? Title Patient will improve her 5x sit to stand time to 14 seconds or less to reduce her fall risk and improve her lower extremity power.   ? Time 6   ? Period Weeks   ? Status New   ? Target Date 11/28/21   ?  ? PT LONG TERM GOAL #4  ? Title Patient will report being able to stand at least 30 minutes for improved function completing her household activities.   ? Time 6   ? Period Weeks   ? Status New   ? Target Date 11/28/21   ? ?  ?  ? ?  ? ? ? ? ? ? ? ? Plan - 10/21/21 0905   ? ? Clinical Impression Statement Patient was introduced to multiple new interventions for improved lower extremity strength with moderate difficulty and fatigue. She experienced a mild increase in low back discomfort with seated marching with 2 pound ankle weights. However, this was able to be relieved with performing this activity in a chair with back support and removing the ankle weights. She reported feeling good upon the conclusion of treatment. She continues to require skilled physical therapy to address her remaining impairments to improve her safety and functional mobility.   ? Personal Factors and Comorbidities Time since onset of injury/illness/exacerbation;Comorbidity 3+   ? Comorbidities Parkinson's Disease (Dx September 2020) A Fib, neuropathy, osteoporosis.   ? Examination-Activity Limitations Transfers;Locomotion Level;Stand;Stairs   ? Examination-Participation Restrictions Meal Prep;Cleaning;Community Activity;Shop   ? Stability/Clinical Decision Making Evolving/Moderate complexity   ? Rehab Potential Good   ? PT Frequency 3x / week   2-3x / week  ? PT Duration 6 weeks   ? PT Treatment/Interventions ADLs/Self Care Home Management;Balance training;Therapeutic exercise;Therapeutic activities;Functional mobility training;Stair training;Gait training;Neuromuscular  re-education;Patient/family education   ? PT Next Visit Plan Nustep, lower extremity strengthening and balance interventions   ? Consulted and Agree with Plan of Care Patient   ? ?  ?  ? ?  ? ? ?Patient will benefit from skilled

## 2021-10-24 ENCOUNTER — Ambulatory Visit: Payer: Medicare Other

## 2021-10-24 ENCOUNTER — Other Ambulatory Visit: Payer: Self-pay

## 2021-10-24 DIAGNOSIS — Z9181 History of falling: Secondary | ICD-10-CM | POA: Diagnosis not present

## 2021-10-24 DIAGNOSIS — M6281 Muscle weakness (generalized): Secondary | ICD-10-CM

## 2021-10-24 DIAGNOSIS — G20 Parkinson's disease: Secondary | ICD-10-CM | POA: Diagnosis not present

## 2021-10-24 NOTE — Therapy (Signed)
[place] ?[facility] ?[place] ?[place], [place], [address] ?Phone: [phone]   Fax:  [phone] ? ?Physical Therapy Treatment ? ?Patient Details  ?Name: Cassie White ?MRN: [id number] ?Date of Birth: 16-Nov-1940 ?Referring Provider (PT): Alonza Bogus, DO ? ? ?Encounter Date: 10/24/2021 ? ? PT End of Session - 10/24/21 1304   ? ? Visit Number 3   ? Number of Visits 14   ? Date for PT Re-Evaluation 12/27/21   ? PT Start Time 1301   ? PT Stop Time 1344   ? PT Time Calculation (min) 43 min   ? Activity Tolerance Patient tolerated treatment well   ? Behavior During Therapy [facility] for tasks assessed/performed   ? ?  ?  ? ?  ? ? ?Past Medical History:  ?Diagnosis Date  ? Abnormal mammogram of left breast   ? Likely benign microcalcifications--repeat L diag mammo 03/24/2017.  COMPLEX SCLEROSING LESION WITH CALCIFICATIONS --no sign of malignancy.  Excision recommended--pt has been referred to Dr. Donne Hazel, who recommended f/u mammo and this was normal 12/2017--consider rpt 1 yr per rad.  ? Arthritis   ? DJD, back and both hips  ? Chronic low back pain without sciatica   ? Lumbar spondylosis + scoliosis.  Summer 2017 Dr. Nelva Bush did ESI and pt got no relief.  Pt then saw Dr. Maia Petties [facility]: felt she had facet mediated pain; plan for B L345 MBB (??).  ? Cystocele, midline   ? Diverticulosis   ? on colonoscopy 2004  ? GERD (gastroesophageal reflux disease)   ? Hx of cardiovascular stress test   ? Lexiscan Myoview (2/14):  Low risk, small ant defect likely breast attenuation, no ischemia, EF 69% (no change from 2010).    ? Hyperlipidemia   ? Hypertension   ? per pt  ? Idiopathic scoliosis of thoracolumbar region   ? Lumbar back pain   ? Mitral regurgitation   ? Echo (2/14):  EF 60-65%, Gr 1 DD, mild AI, mild bileaflet MVP, mod post directed MR, mild LAE, PASP 35.  ? MVP (mitral valve prolapse)   ? Neuropathy 2019  ? 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  Responding to gabapentin (Dr. Brett Fairy).  ? Osteoporosis, senile   ? stable left hip 2015 (took fosamax x 62yr, T-score -2.7 when she stopped bisphosphonate).  Rpt DEXA 231moater (09/2016) T-score -.3.1 (different machine, though). 11/2019 DEXA T-score -4. Recommended restart of fosamax  ? PA[place] ([place]oxysmal [place] fibrillation) ([place]  ? Dr. JoMartiniqueFlecainide, metopr, apxiaban.  08/2017-->persistent a-fib w/RVR, flecainide increased and she converted back to NSR and felt better.  '100mg'$  bid flecainide continued.  ? [place]  ? started sinemet 05/2019 (Dr. TaCarles Collet ? Peripheral neuropathy 2008  ? Idiopathic vs familial (motor>sensory) vs lumbar disc dz: gabapentin helpful  ? Shoulder pain left  ? RC surg 09/2014  ? ? ?Past Surgical History:  ?Procedure Laterality Date  ? BREAST BIOPSY  04/10/2017  ? Left breast core needle biopsy of calcifications.  COMPLEX SCLEROSING LESION WITH CALCIFICATIONS --no sign of malignancy.  Excision recommended--pt has been referred to Dr. WaDonne Hazelwho recommended rpt mammo and this was NORMAL 12/2017.  ? BREAST EXCISIONAL BIOPSY Right 2010  ? Benign  ? BREAST SURGERY  Nov 2010  ? Benign biopsy, right  ? CHOLECYSTECTOMY  03/2010  ? Dr.Gross  ? COLONOSCOPY  10/03/2002  ? No polyps.  Repeat 10 yrs recommended but pt declines.  Pt declined cologuard 08/2016 but changed her mind and this test was NEG on 03/23/17.  ? COMBINED HYSTERECTOMY VAGINAL / OOPHORECTOMY / A&P REPAIR  6237  ? uncertain if ovaries were removed or not  ? CYSTOCELE REPAIR    ? DEXA  11/2019  ? T score -4  ? Lumpectomy  06/2009  ? "Fatty Necrosis"  ? PFT's  2014  ? NORMAL  ? REVERSE SHOULDER ARTHROPLASTY Right 03/11/2019  ? Procedure: REVERSE TOTAL SHOULDER ARTHROPLASTY;  Surgeon: Netta Cedars, MD;  Location: [facility];  Service: Orthopedics;  Laterality: Right;  Intersclene block  ? ROTATOR CUFF REPAIR Left 10/02/14  ? Dr. Veverly Fells  ? TONSILLECTOMY    ? TOTAL KNEE ARTHROPLASTY  09/2009  ? Right  ;Dr Alvan Dame  ? TOTAL KNEE ARTHROPLASTY Left 03/10/2016  ? Procedure: TOTAL KNEE ARTHROPLASTY;  Surgeon: Paralee Cancel, MD;  Location: [facility];  Service: Orthopedics;  Laterality: Left;  ? TRANSTHORACIC ECHOCARDIOGRAM  09/2012; 09/27/15  ? 2014: EF 60-65%, Gr 1 DD, mild AI, mild bileaflet MVP, mod post directed MR, mild LAE, PASP 35.  Repeat 2017: EF 55-60%, mild AR, mild MVP and mild MV regurg.  ? VAGINAL HYSTERECTOMY    ? For Uterine Deviation   ? ? ?There were no vitals filed for this visit. ? ? Subjective Assessment - 10/24/21 1302   ? ? Subjective Patient reports that she felt alright after her last appointment. She notes that her right leg is hurting today.   ? Pertinent History Parkinson's Disease, low back pain, bilateral TKA's,   ? How long can you walk comfortably? 10-15 minutes prior to haing to rest   ? Patient Stated Goals increase lower extremity strength and reduce her risk of falling   ? ?  ?  ? ?  ? ? ? ? ? ? ? ? ? ? ? ? ? ? ? ? ? ? ? ? [place] Adult PT Treatment/Exercise - 10/24/21 0001   ? ?  ? Knee/Hip Exercises: Aerobic  ? Nustep L3 x 15 minutes   ?  ? Knee/Hip Exercises: Standing  ? Hip Extension Both;20 reps;Knee straight   ? Functional Squat 20 reps   with BUE support  ?  ? Knee/Hip Exercises: Seated  ? Long [employer] Both   2 minutes  ? Long Arc Quad Weight 3 lbs.   ? Clamshell with Thera[employer] Red   ? Other Seated Knee/Hip Exercises Heel/toe raises   3 minutes  ? Marching Both   ? Marching Weights 3 lbs.   ? Abduction/Adduction  Other (comment);Both   crossing midline with floor taps; 2 minutes  ? ?  ?  ? ?  ? ? ? ? ? ? ? ? ? ? ? ? ? ? ? PT Long Term Goals - 10/17/21 1723   ? ?  ? PT LONG TERM GOAL #1  ? Title Patient will be independent with her HEP.   ? Time 6   ? Period Weeks   ? Status New   ? Target Date 11/28/21   ?  ? PT LONG TERM GOAL #2  ? Title Patient will improve her TUG time to 13 seconds or less to reduce her risk of falls.   ? Time 6   ? Period Weeks   ? Status New   ? Target Date 11/28/21    ?  ? PT LONG TERM GOAL #3  ? Title Patient will improve her 5x sit to stand time to 14 seconds or  less to reduce her fall risk and improve her lower extremity power.   ? Time 6   ? Period Weeks   ? Status New   ? Target Date 11/28/21   ?  ? PT LONG TERM GOAL #4  ? Title Patient will report being able to stand at least 30 minutes for improved function completing her household activities.   ? Time 6   ? Period Weeks   ? Status New   ? Target Date 11/28/21   ? ?  ?  ? ?  ? ? ? ? ? ? ? ? Plan - 10/24/21 1304   ? ? Clinical Impression Statement Patient was progressed with multiple new and familiar interventions for improved lower extremity strength and stability with functional activities. She required minimal cuing with seated clams for proper positioning to facilitate hip external rotator engagement. She reported feeling tired, but her leg was not hurting upon the conclusion of treatment. She continues to require skilled physical therapy to address her remaining impairments to maximize her functional mobility and safety.   ? Personal Factors and Comorbidities Time since onset of injury/illness/exacerbation;Comorbidity 3+   ? Comorbidities Parkinson's Disease (Dx September 2020) A Fib, neuropathy, osteoporosis.   ? Examination-Activity Limitations Transfers;Locomotion Level;Stand;Stairs   ? Examination-Participation Restrictions Meal Prep;Cleaning;Community Activity;Shop   ? Stability/Clinical Decision Making Evolving/Moderate complexity   ? Rehab Potential Good   ? PT Frequency 3x / week   2-3x / week  ? PT Duration 6 weeks   ? PT Treatment/Interventions ADLs/Self Care Home Management;Balance training;Therapeutic exercise;Therapeutic activities;Functional mobility training;Stair training;Gait training;Neuromuscular re-education;Patient/family education   ? PT Next Visit Plan Nustep, lower extremity strengthening and balance interventions   ? Consulted and Agree with Plan of Care Patient   ? ?  ?  ? ?  ? ? ?Patient  will benefit from skilled therapeutic intervention in order to improve the following deficits and impairments:  Abnormal gait, Difficulty walking, Decreased balance, Decreased activity tolerance, Decreased str

## 2021-10-25 ENCOUNTER — Ambulatory Visit: Payer: Medicare Other

## 2021-10-25 DIAGNOSIS — M6281 Muscle weakness (generalized): Secondary | ICD-10-CM | POA: Diagnosis not present

## 2021-10-25 DIAGNOSIS — Z9181 History of falling: Secondary | ICD-10-CM | POA: Diagnosis not present

## 2021-10-25 DIAGNOSIS — G20 Parkinson's disease: Secondary | ICD-10-CM | POA: Diagnosis not present

## 2021-10-25 NOTE — Therapy (Signed)
[address] ?[facility] ?[place] ?[place], [place], [address] ?Phone: [phone]   Fax:  [phone] ? ?Physical Therapy Treatment ? ?Patient Details  ?Name: Cassie White ?MRN: [id number] ?Date of Birth: 04-23-1941 ?Referring Provider (PT): Alonza Bogus, DO ? ? ?Encounter Date: 10/25/2021 ? ? PT End of Session - 10/25/21 1034   ? ? Visit Number 4   ? Number of Visits 14   ? Date for PT Re-Evaluation 12/27/21   ? PT Start Time 1030   ? PT Stop Time 1113   ? PT Time Calculation (min) 43 min   ? Activity Tolerance Patient tolerated treatment well   ? Behavior During Therapy [facility] for tasks assessed/performed   ? ?  ?  ? ?  ? ? ?Past Medical History:  ?Diagnosis Date  ? Abnormal mammogram of left breast   ? Likely benign microcalcifications--repeat L diag mammo 03/24/2017.  COMPLEX SCLEROSING LESION WITH CALCIFICATIONS --no sign of malignancy.  Excision recommended--pt has been referred to Dr. Donne Hazel, who recommended f/u mammo and this was normal 12/2017--consider rpt 1 yr per rad.  ? Arthritis   ? DJD, back and both hips  ? Chronic low back pain without sciatica   ? Lumbar spondylosis + scoliosis.  Summer 2017 Dr. Nelva Bush did ESI and pt got no relief.  Pt then saw Dr. Maia [facility]: felt she had facet mediated pain; plan for B L345 MBB (??).  ? Cystocele, midline   ? Diverticulosis   ? on colonoscopy 2004  ? GERD (gastroesophageal reflux disease)   ? Hx of cardiovascular stress test   ? Lexiscan Myoview (2/14):  Low risk, small ant defect likely breast attenuation, no ischemia, EF 69% (no change from 2010).    ? Hyperlipidemia   ? Hypertension   ? per pt  ? Idiopathic scoliosis of thoracolumbar region   ? Lumbar back pain   ? Mitral regurgitation   ? Echo (2/14):  EF 60-65%, Gr 1 DD, mild AI, mild bileaflet MVP, mod post directed MR, mild LAE, PASP 35.  ? MVP (mitral valve prolapse)   ? Neuropathy 2019  ? 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  Responding to gabapentin (Dr. Brett Fairy).  ? Osteoporosis, senile   ? stable left hip 2015 (took fosamax x 32yr, T-score -2.7 when she stopped bisphosphonate).  Rpt DEXA 251moater (09/2016) T-score -.3.1 (different machine, though). 11/2019 DEXA T-score -4. Recommended restart of fosamax  ? PAF (paroxysmal atrial fibrillation) ([facility]  ? Dr. JoMartiniqueFlecainide, metopr, apxiaban.  08/2017-->persistent a-fib w/RVR, flecainide increased and she converted back to NSR and felt better.  '100mg'$  bid flecainide continued.  ? Parkinson's disease ([facility]2020 dx  ? started sinemet 05/2019 (Dr. TaCarles Collet ? Peripheral neuropathy 2008  ? Idiopathic vs familial (motor>sensory) vs lumbar disc dz: gabapentin helpful  ? Shoulder pain left  ? RC surg 09/2014  ? ? ?Past Surgical History:  ?Procedure Laterality Date  ? BREAST BIOPSY  04/10/2017  ? Left breast core needle biopsy of calcifications.  COMPLEX SCLEROSING LESION WITH CALCIFICATIONS --no sign of malignancy.  Excision recommended--pt has been referred to Dr. WaDonne Hazelwho recommended rpt mammo and this was NORMAL 12/2017.  ? BREAST EXCISIONAL BIOPSY Right 2010  ? Benign  ? BREAST SURGERY  Nov 2010  ? Benign biopsy, right  ? CHOLECYSTECTOMY  03/2010  ? Dr.Gross  ? COLONOSCOPY  10/03/2002  ? No polyps.  Repeat 10 yrs recommended but pt declines.  Pt declined cologuard 08/2016 but changed her mind and this test was NEG on 03/23/17.  ? COMBINED HYSTERECTOMY VAGINAL / OOPHORECTOMY / A&P REPAIR  3546  ? uncertain if ovaries were removed or not  ? CYSTOCELE REPAIR    ? DEXA  11/2019  ? T score -4  ? Lumpectomy  06/2009  ? "Fatty Necrosis"  ? PFT's  2014  ? NORMAL  ? REVERSE SHOULDER ARTHROPLASTY Right 03/11/2019  ? Procedure: REVERSE TOTAL SHOULDER ARTHROPLASTY;  Surgeon: Netta Cedars, MD;  Location: [facility];  Service: Orthopedics;  Laterality: Right;  Intersclene block  ? ROTATOR CUFF REPAIR Left 10/02/14  ? Dr. Veverly Fells  ? TONSILLECTOMY    ? TOTAL KNEE ARTHROPLASTY  09/2009  ? Right  ;Dr Alvan Dame  ? TOTAL KNEE ARTHROPLASTY Left 03/10/2016  ? Procedure: TOTAL KNEE ARTHROPLASTY;  Surgeon: Paralee Cancel, MD;  Location: [facility];  Service: Orthopedics;  Laterality: Left;  ? TRANSTHORACIC ECHOCARDIOGRAM  09/2012; 09/27/15  ? 2014: EF 60-65%, Gr 1 DD, mild AI, mild bileaflet MVP, mod post directed MR, mild LAE, PASP 35.  Repeat 2017: EF 55-60%, mild AR, mild MVP and mild MV regurg.  ? VAGINAL HYSTERECTOMY    ? For Uterine Deviation   ? ? ?There were no vitals filed for this visit. ? ? Subjective Assessment - 10/25/21 1033   ? ? Subjective Pt arrives for today's treamtent session reporting feeling "woobly," but no pain.   ? Pertinent History Parkinson's Disease, low back pain, bilateral TKA's,   ? How long can you walk comfortably? 10-15 minutes prior to haing to rest   ? Patient Stated Goals increase lower extremity strength and reduce her risk of falling   ? Currently in Pain? No/denies   ? ?  ?  ? ?  ? ? ? ? ? ? ? ? ? ? ? ? ? ? ? ? ? ? ? ? [place] Adult PT Treatment/Exercise - 10/25/21 0001   ? ?  ? Exercises  ? Exercises Knee/Hip   ?  ? Knee/Hip Exercises: Aerobic  ? Nustep Lvl 3 x 15 mins   ?  ? Knee/Hip Exercises: Standing  ? Heel Raises 20 reps   ? Heel Raises Limitations Toe raises x 20 reps   ? Hip Flexion Both;20 reps;Knee bent   ? Hip ADduction Both;20 reps   ?  ? Knee/Hip Exercises: Seated  ? Long [employer] Both   2 mins  ? Long Arc Quad Weight 3 lbs.   ? Ball Squeeze 2 mins   ? Clamshell with TheraBand Red   2 mins  ? Marching Both   ? Marching Weights 3 lbs.   ? Hamstring Curl Both;20 reps   ? Hamstring Limitations red tband   ? Sit to Sand 10 reps;without UE support   ? ?  ?  ? ?  ? ? ? ? ? ? ? ? ? ? ? ? ? ? ? PT Long Term Goals - 10/17/21 1723   ? ?  ? PT LONG TERM GOAL #1  ? Title Patient will be independent with her HEP.   ? Time 6   ? Period Weeks   ? Status New   ? Target Date 11/28/21   ?  ? PT LONG TERM GOAL #2  ? Title Patient will improve her TUG time to 13 seconds or less to reduce her risk  of falls.   ? Time 6   ? Period Weeks   ? Status New   ?  Target Date 11/28/21   ?  ? PT LONG TERM GOAL #3  ? Title Patient will improve her 5x sit to stand time to 14 seconds or less to reduce her fall risk and improve her lower extremity power.   ? Time 6   ? Period Weeks   ? Status New   ? Target Date 11/28/21   ?  ? PT LONG TERM GOAL #4  ? Title Patient will report being able to stand at least 30 minutes for improved function completing her household activities.   ? Time 6   ? Period Weeks   ? Status New   ? Target Date 11/28/21   ? ?  ?  ? ?  ? ? ? ? ? ? ? ? Plan - 10/25/21 1035   ? ? Clinical Impression Statement Pt arrives for today's treatment session denying any pain, but states that she feels "woobly."  Pt able to tolerate increased standing exercises today without increase in pain, but does report increase in fatigue requiring seated rest break.  Pt also ablte to tolerate the introduction to several seated resisted exercises without report of pain.  Pt requiring min cues for proper technique with newly introduced exercises.  Pt denied any pain at completion of today's treatment session, but does report increased fatigue.   ? Personal Factors and Comorbidities Time since onset of injury/illness/exacerbation;Comorbidity 3+   ? Comorbidities Parkinson's Disease (Dx September 2020) A Fib, neuropathy, osteoporosis.   ? Examination-Activity Limitations Transfers;Locomotion Level;Stand;Stairs   ? Examination-Participation Restrictions Meal Prep;Cleaning;Community Activity;Shop   ? Stability/Clinical Decision Making Evolving/Moderate complexity   ? Rehab Potential Good   ? PT Frequency 3x / week   2-3x / week  ? PT Duration 6 weeks   ? PT Treatment/Interventions ADLs/Self Care Home Management;Balance training;Therapeutic exercise;Therapeutic activities;Functional mobility training;Stair training;Gait training;Neuromuscular re-education;Patient/family education   ? PT Next Visit Plan Nustep, lower extremity  strengthening and balance interventions   ? Consulted and Agree with Plan of Care Patient   ? ?  ?  ? ?  ? ? ?Patient will benefit from skilled therapeutic intervention in order to improve the following deficits an

## 2021-10-29 ENCOUNTER — Ambulatory Visit: Payer: Medicare Other | Admitting: *Deleted

## 2021-10-31 ENCOUNTER — Ambulatory Visit: Payer: Medicare Other

## 2021-10-31 ENCOUNTER — Ambulatory Visit: Payer: Medicare Other | Admitting: Neurology

## 2021-10-31 DIAGNOSIS — Z9181 History of falling: Secondary | ICD-10-CM

## 2021-10-31 DIAGNOSIS — M6281 Muscle weakness (generalized): Secondary | ICD-10-CM | POA: Diagnosis not present

## 2021-10-31 DIAGNOSIS — G20 Parkinson's disease: Secondary | ICD-10-CM | POA: Diagnosis not present

## 2021-10-31 NOTE — Therapy (Signed)
[place] ?[facility] ?[address] ?[place], [place], [address] ?Phone: [phone]   Fax:  [phone] ? ?Physical Therapy Treatment ? ?Patient Details  ?Name: Cassie White ?MRN: [id number] ?Date of Birth: Oct 25, 1940 ?Referring Provider (PT): Alonza Bogus, DO ? ? ?Encounter Date: 10/31/2021 ? ? PT End of Session - 10/31/21 1343   ? ? Visit Number 5   ? Number of Visits 14   ? Date for PT Re-Evaluation 12/27/21   ? PT Start Time 1345   ? PT Stop Time 1430   ? PT Time Calculation (min) 45 min   ? Activity Tolerance Patient tolerated treatment well   ? Behavior During Therapy [facility] for tasks assessed/performed   ? ?  ?  ? ?  ? ? ?Past Medical History:  ?Diagnosis Date  ? Abnormal mammogram of left breast   ? Likely benign microcalcifications--repeat L diag mammo 03/24/2017.  COMPLEX SCLEROSING LESION WITH CALCIFICATIONS --no sign of malignancy.  Excision recommended--pt has been referred to Dr. Donne Hazel, who recommended f/u mammo and this was normal 12/2017--consider rpt 1 yr per rad.  ? Arthritis   ? DJD, back and both hips  ? Chronic low back pain without sciatica   ? Lumbar spondylosis + scoliosis.  Summer 2017 Dr. Nelva Bush did ESI and pt got no relief.  Pt then saw Dr. Maia Petties [facility]: felt she had facet mediated pain; plan for B L345 MBB (??).  ? Cystocele, midline   ? Diverticulosis   ? on colonoscopy 2004  ? GERD (gastroesophageal reflux disease)   ? Hx of cardiovascular stress test   ? Lexiscan Myoview (2/14):  Low risk, small ant defect likely breast attenuation, no ischemia, EF 69% (no change from 2010).    ? Hyperlipidemia   ? Hypertension   ? per pt  ? Idiopathic scoliosis of thoracolumbar region   ? Lumbar back pain   ? Mitral regurgitation   ? Echo (2/14):  EF 60-65%, Gr 1 DD, mild AI, mild bileaflet MVP, mod post directed MR, mild LAE, PASP 35.  ? MVP (mitral valve prolapse)   ? Neuropathy 2019  ? 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  Responding to gabapentin (Dr. Brett Fairy).  ? Osteoporosis, senile   ? stable left hip 2015 (took fosamax x 61yr, T-score -2.7 when she stopped bisphosphonate).  Rpt DEXA 295moater (09/2016) T-score -.3.1 (different machine, though). 11/2019 DEXA T-score -4. Recommended restart of fosamax  ? PAF (paroxysmal atrial fibrillation) ([facility]  ? Dr. JoMartiniqueFlecainide, metopr, apxiaban.  08/2017-->persistent a-fib w/RVR, flecainide increased and she converted back to NSR and felt better.  '100mg'$  bid flecainide continued.  ? Parkinson's disease (HCH[place]2020 dx  ? started sinemet 05/2019 (Dr. TaCarles Collet ? Peripheral neuropathy 2008  ? Idiopathic vs familial (motor>sensory) vs lumbar disc dz: gabapentin helpful  ? Shoulder pain left  ? RC surg 09/2014  ? ? ?Past Surgical History:  ?Procedure Laterality Date  ? BREAST BIOPSY  04/10/2017  ? Left breast core needle biopsy of calcifications.  COMPLEX SCLEROSING LESION WITH CALCIFICATIONS --no sign of malignancy.  Excision recommended--pt has been referred to Dr. WaDonne Hazelwho recommended rpt mammo and this was NORMAL 12/2017.  ? BREAST EXCISIONAL BIOPSY Right 2010  ? Benign  ? BREAST SURGERY  Nov 2010  ? Benign biopsy, right  ? CHOLECYSTECTOMY  03/2010  ? Dr.Gross  ? COLONOSCOPY  10/03/2002  ? No polyps.  Repeat 10 yrs recommended but pt declines.  Pt declined cologuard 08/2016 but changed her mind and this test was NEG on 03/23/17.  ? COMBINED HYSTERECTOMY VAGINAL / OOPHORECTOMY / A&P REPAIR  4081  ? uncertain if ovaries were removed or not  ? CYSTOCELE REPAIR    ? DEXA  11/2019  ? T score -4  ? Lumpectomy  06/2009  ? "Fatty Necrosis"  ? PFT's  2014  ? NORMAL  ? REVERSE SHOULDER ARTHROPLASTY Right 03/11/2019  ? Procedure: REVERSE TOTAL SHOULDER ARTHROPLASTY;  Surgeon: Netta Cedars, MD;  Location: [facility];  Service: Orthopedics;  Laterality: Right;  Intersclene block  ? ROTATOR CUFF REPAIR Left 10/02/14  ? Dr. Veverly Fells  ? TONSILLECTOMY    ? TOTAL KNEE ARTHROPLASTY  09/2009  ? Right  ;Dr Alvan Dame  ? TOTAL KNEE ARTHROPLASTY Left 03/10/2016  ? Procedure: TOTAL KNEE ARTHROPLASTY;  Surgeon: Paralee Cancel, MD;  Location: [facility];  Service: Orthopedics;  Laterality: Left;  ? TRANSTHORACIC ECHOCARDIOGRAM  09/2012; 09/27/15  ? 2014: EF 60-65%, Gr 1 DD, mild AI, mild bileaflet MVP, mod post directed MR, mild LAE, PASP 35.  Repeat 2017: EF 55-60%, mild AR, mild MVP and mild MV regurg.  ? VAGINAL HYSTERECTOMY    ? For Uterine Deviation   ? ? ?There were no vitals filed for this visit. ? ? Subjective Assessment - 10/31/21 1404   ? ? Subjective Patient reports that she feels alright today. She was not too tired after her last appointment.   ? Pertinent History Parkinson's Disease, low back pain, bilateral TKA's,   ? How long can you walk comfortably? 10-15 minutes prior to haing to rest   ? Patient Stated Goals increase lower extremity strength and reduce her risk of falling   ? Currently in Pain? No/denies   ? ?  ?  ? ?  ? ? ? ? ? ? ? ? ? ? ? ? ? ? ? ? ? ? ? ? [place] Adult PT Treatment/Exercise - 10/31/21 0001   ? ?  ? Knee/Hip Exercises: Aerobic  ? Nustep L4 x 15 minutes   ?  ? Knee/Hip Exercises: Standing  ? Heel Raises Both;Other (comment)   2 minutes  ? Heel Raises Limitations Toe raises x 2 minutes   ? Hip Abduction Both;20 reps;Knee straight   ? Forward Step Up Both;2 sets;15 reps;Hand Hold: 2;Step Height: 4"   ?  ? Knee/Hip Exercises: Seated  ? Long [employer] Both   2 minutes  ? Long Arc Quad Weight 4 lbs.   ? Marching Both   2 minutes  ? Marching Weights 4 lbs.   ? ?  ?  ? ?  ? ? ? ? ? ? Balance Exercises - 10/31/21 0001   ? ?  ? Balance Exercises: Standing  ? Standing Eyes Opened Wide (BOA);Foam/compliant surface;4 reps;30 secs   ? Marching Foam/compliant surface;Upper extremity assist 1;Static   2 minutes  ? ?  ?  ? ?  ? ? ? ? ? ? ? ? ? ? PT Long Term Goals - 10/17/21 1723   ? ?  ? PT LONG TERM GOAL #1  ? Title Patient will be independent with her HEP.   ? Time 6   ? Period Weeks   ? Status New   ? Target Date  11/28/21   ?  ? PT LONG TERM GOAL #2  ? Title Patient will improve her TUG time to 13 seconds or less to reduce her risk of falls.   ? Time  6   ? Period Weeks   ? Status New   ? Target Date 11/28/21   ?  ? PT LONG TERM GOAL #3  ? Title Patient will improve her 5x sit to stand time to 14 seconds or less to reduce her fall risk and improve her lower extremity power.   ? Time 6   ? Period Weeks   ? Status New   ? Target Date 11/28/21   ?  ? PT LONG TERM GOAL #4  ? Title Patient will report being able to stand at least 30 minutes for improved function completing her household activities.   ? Time 6   ? Period Weeks   ? Status New   ? Target Date 11/28/21   ? ?  ?  ? ?  ? ? ? ? ? ? ? ? Plan - 10/31/21 1405   ? ? Clinical Impression Statement Patient was progressed with multiple new interventions for improved lower extremity strength and stability. She required minimal cuing with today's standing interventions for upright posture to avoid trunk flexion. She reported feeling a little tired, but good upon the conclusion of treatment. She continues to require skilled physical therapy to address her remaining impairments to maximize her functional mobility and safety.   ? Personal Factors and Comorbidities Time since onset of injury/illness/exacerbation;Comorbidity 3+   ? Comorbidities Parkinson's Disease (Dx September 2020) A Fib, neuropathy, osteoporosis.   ? Examination-Activity Limitations Transfers;Locomotion Level;Stand;Stairs   ? Examination-Participation Restrictions Meal Prep;Cleaning;Community Activity;Shop   ? Stability/Clinical Decision Making Evolving/Moderate complexity   ? Rehab Potential Good   ? PT Frequency 3x / week   2-3x / week  ? PT Duration 6 weeks   ? PT Treatment/Interventions ADLs/Self Care Home Management;Balance training;Therapeutic exercise;Therapeutic activities;Functional mobility training;Stair training;Gait training;Neuromuscular re-education;Patient/family education   ? PT Next Visit Plan  Nustep, lower extremity strengthening and balance interventions   ? Consulted and Agree with Plan of Care Patient   ? ?  ?  ? ?  ? ? ?Patient will benefit from skilled therapeutic intervention in order to imp

## 2021-11-01 DIAGNOSIS — L309 Dermatitis, unspecified: Secondary | ICD-10-CM | POA: Diagnosis not present

## 2021-11-05 ENCOUNTER — Encounter: Payer: Self-pay | Admitting: Physical Therapy

## 2021-11-05 ENCOUNTER — Ambulatory Visit: Payer: Medicare Other | Attending: Neurology | Admitting: Physical Therapy

## 2021-11-05 DIAGNOSIS — Z9181 History of falling: Secondary | ICD-10-CM | POA: Insufficient documentation

## 2021-11-05 DIAGNOSIS — M6281 Muscle weakness (generalized): Secondary | ICD-10-CM | POA: Diagnosis not present

## 2021-11-05 NOTE — Therapy (Signed)
[place] ?[facility] ?[place] ?[place], [place], [address] ?Phone: [phone]   Fax:  [phone] ? ?Physical Therapy Treatment ? ?Patient Details  ?Name: Cassie White ?MRN: [id number] ?Date of Birth: 06/25/1941 ?Referring Provider (PT): Alonza Bogus, DO ? ? ?Encounter Date: 11/05/2021 ? ? PT End of Session - 11/05/21 1320   ? ? Visit Number 6   ? Number of Visits 14   ? Date for PT Re-Evaluation 12/27/21   ? PT Start Time 1303   ? PT Stop Time 7124   ? PT Time Calculation (min) 40 min   ? Equipment Utilized During Treatment Other (comment)   rollator  ? Activity Tolerance Patient tolerated treatment well   ? Behavior During Therapy [facility] for tasks assessed/performed   ? ?  ?  ? ?  ? ? ?Past Medical History:  ?Diagnosis Date  ? Abnormal mammogram of left breast   ? Likely benign microcalcifications--repeat L diag mammo 03/24/2017.  COMPLEX SCLEROSING LESION WITH CALCIFICATIONS --no sign of malignancy.  Excision recommended--pt has been referred to Dr. Donne Hazel, who recommended f/u mammo and this was normal 12/2017--consider rpt 1 yr per rad.  ? Arthritis   ? DJD, back and both hips  ? Chronic low back pain without sciatica   ? Lumbar spondylosis + scoliosis.  Summer 2017 Dr. Nelva Bush did ESI and pt got no relief.  Pt then saw Dr. Maia [facility]: felt she had facet mediated pain; plan for B L345 MBB (??).  ? Cystocele, midline   ? Diverticulosis   ? on colonoscopy 2004  ? GERD (gastroesophageal reflux disease)   ? Hx of cardiovascular stress test   ? Lexiscan Myoview (2/14):  Low risk, small ant defect likely breast attenuation, no ischemia, EF 69% (no change from 2010).    ? Hyperlipidemia   ? Hypertension   ? per pt  ? Idiopathic scoliosis of thoracolumbar region   ? Lumbar back pain   ? Mitral regurgitation   ? Echo (2/14):  EF 60-65%, Gr 1 DD, mild AI, mild bileaflet MVP, mod post directed MR, mild LAE, PASP 35.  ? MVP (mitral valve prolapse)   ? Neuropathy 2019   ? 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  Responding to gabapentin (Dr. Brett Fairy).  ? Osteoporosis, senile   ? stable left hip 2015 (took fosamax x 62yr, T-score -2.7 when she stopped bisphosphonate).  Rpt DEXA 237moater (09/2016) T-score -.3.1 (different machine, though). 11/2019 DEXA T-score -4. Recommended restart of fosamax  ? PAF (paroxysmal atrial fibrillation) ([facility]  ? Dr. JoMartiniqueFlecainide, metopr, apxiaban.  08/2017-->persistent a-fib w/RVR, flecainide increased and she converted back to NSR and felt better.  '100mg'$  bid flecainide continued.  ? Parkinson's disease ([facility]2020 dx  ? started sinemet 05/2019 (Dr. TaCarles Collet ? Peripheral neuropathy 2008  ? Idiopathic vs familial (motor>sensory) vs lumbar disc dz: gabapentin helpful  ? Shoulder pain left  ? RC surg 09/2014  ? ? ?Past Surgical History:  ?Procedure Laterality Date  ? BREAST BIOPSY  04/10/2017  ? Left breast core needle biopsy of calcifications.  COMPLEX SCLEROSING LESION WITH CALCIFICATIONS --no sign of malignancy.  Excision recommended--pt has been referred to Dr. WaDonne Hazelwho recommended rpt mammo and this was NORMAL 12/2017.  ? BREAST EXCISIONAL BIOPSY Right 2010  ? Benign  ? BREAST SURGERY  Nov 2010  ? Benign biopsy, right  ? CHOLECYSTECTOMY  03/2010  ? Dr.Gross  ? COLONOSCOPY  10/03/2002  ?  No polyps.  Repeat 10 yrs recommended but pt declines.  Pt declined cologuard 08/2016 but changed her mind and this test was NEG on 03/23/17.  ? COMBINED HYSTERECTOMY VAGINAL / OOPHORECTOMY / A&P REPAIR  9381  ? uncertain if ovaries were removed or not  ? CYSTOCELE REPAIR    ? DEXA  11/2019  ? T score -4  ? Lumpectomy  06/2009  ? "Fatty Necrosis"  ? PFT's  2014  ? NORMAL  ? REVERSE SHOULDER ARTHROPLASTY Right 03/11/2019  ? Procedure: REVERSE TOTAL SHOULDER ARTHROPLASTY;  Surgeon: Netta Cedars, MD;  Location: [facility];  Service: Orthopedics;  Laterality: Right;  Intersclene block  ? ROTATOR CUFF REPAIR Left 10/02/14  ? Dr. Veverly Fells  ?  TONSILLECTOMY    ? TOTAL KNEE ARTHROPLASTY  09/2009  ? Right ;Dr Alvan Dame  ? TOTAL KNEE ARTHROPLASTY Left 03/10/2016  ? Procedure: TOTAL KNEE ARTHROPLASTY;  Surgeon: Paralee Cancel, MD;  Location: [facility];  Service: Orthopedics;  Laterality: Left;  ? TRANSTHORACIC ECHOCARDIOGRAM  09/2012; 09/27/15  ? 2014: EF 60-65%, Gr 1 DD, mild AI, mild bileaflet MVP, mod post directed MR, mild LAE, PASP 35.  Repeat 2017: EF 55-60%, mild AR, mild MVP and mild MV regurg.  ? VAGINAL HYSTERECTOMY    ? For Uterine Deviation   ? ? ?There were no vitals filed for this visit. ? ? Subjective Assessment - 11/05/21 1320   ? ? Subjective Patient reports that she feels alright today.   ? Pertinent History Parkinson's Disease, low back pain, bilateral TKA's,   ? How long can you walk comfortably? 10-15 minutes prior to haing to rest   ? Patient Stated Goals increase lower extremity strength and reduce her risk of falling   ? Currently in Pain? No/denies   ? ?  ?  ? ?  ? ? ? ? ? [facility] PT Assessment - 11/05/21 0001   ? ?  ? Assessment  ? Medical Diagnosis Parkinson's Disease   ? Referring Provider (PT) Alonza Bogus, DO   ? Next MD Visit 6 months   ? Prior Therapy yes   ?  ? Precautions  ? Precautions Fall   ?  ? Restrictions  ? Weight Bearing Restrictions No   ? ?  ?  ? ?  ? ? ? ? ? ? ? ? ? ? ? ? ? ? ? ? [place] Adult PT Treatment/Exercise - 11/05/21 0001   ? ?  ? Knee/Hip Exercises: Aerobic  ? Nustep L3 x16 min   ?  ? Knee/Hip Exercises: Standing  ? Heel Raises Both;20 reps   ? Heel Raises Limitations Toe raises x 20 reps   ? Hip Abduction AROM;Both;3 sets;10 reps;Knee straight   ? Forward Step Up Both;2 sets;15 reps;Hand Hold: 2;Step Height: 4"   ?  ? Knee/Hip Exercises: Seated  ? Long [employer] Strengthening;Both;20 reps;Weights   ? Long Arc Quad Weight 4 lbs.   ? Clamshell with [employer]   x30 reps  ? Marching Strengthening;Both;20 reps;Weights   ? Marching Weights 4 lbs.   ? Sit to Sand 10 reps;with UE support;without UE support   ? ?  ?  ? ?   ? ? ? ? ? ? ? ? ? ? ? ? ? ? ? PT Long Term Goals - 10/17/21 1723   ? ?  ? PT LONG TERM GOAL #1  ? Title Patient will be independent with her HEP.   ? Time 6   ? Period Weeks   ?  Status New   ? Target Date 11/28/21   ?  ? PT LONG TERM GOAL #2  ? Title Patient will improve her TUG time to 13 seconds or less to reduce her risk of falls.   ? Time 6   ? Period Weeks   ? Status New   ? Target Date 11/28/21   ?  ? PT LONG TERM GOAL #3  ? Title Patient will improve her 5x sit to stand time to 14 seconds or less to reduce her fall risk and improve her lower extremity power.   ? Time 6   ? Period Weeks   ? Status New   ? Target Date 11/28/21   ?  ? PT LONG TERM GOAL #4  ? Title Patient will report being able to stand at least 30 minutes for improved function completing her household activities.   ? Time 6   ? Period Weeks   ? Status New   ? Target Date 11/28/21   ? ?  ?  ? ?  ? ? ? ? ? ? ? ? Plan - 11/05/21 1352   ? ? Clinical Impression Statement Patient presented in clinic with reports of no new falls. Patient uses rollator as well as furniture when ambulating. Patient fatigued fairly quickly today in standing as well as sit <> stands. Patient states that fatigue alleviates with rest.   ? Personal Factors and Comorbidities Time since onset of injury/illness/exacerbation;Comorbidity 3+   ? Comorbidities Parkinson's Disease (Dx September 2020) A Fib, neuropathy, osteoporosis.   ? Examination-Activity Limitations Transfers;Locomotion Level;Stand;Stairs   ? Examination-Participation Restrictions Meal Prep;Cleaning;Community Activity;Shop   ? Stability/Clinical Decision Making Evolving/Moderate complexity   ? Rehab Potential Good   ? PT Frequency 3x / week   ? PT Duration 6 weeks   ? PT Treatment/Interventions ADLs/Self Care Home Management;Balance training;Therapeutic exercise;Therapeutic activities;Functional mobility training;Stair training;Gait training;Neuromuscular re-education;Patient/family education   ? PT Next Visit  Plan Nustep, lower extremity strengthening and balance interventions   ? Consulted and Agree with Plan of Care Patient   ? ?  ?  ? ?  ? ? ?Patient will benefit from skilled therapeutic intervention in order to im

## 2021-11-06 ENCOUNTER — Other Ambulatory Visit: Payer: Self-pay | Admitting: Cardiology

## 2021-11-07 ENCOUNTER — Ambulatory Visit: Payer: Medicare Other

## 2021-11-07 DIAGNOSIS — Z9181 History of falling: Secondary | ICD-10-CM

## 2021-11-07 DIAGNOSIS — M6281 Muscle weakness (generalized): Secondary | ICD-10-CM | POA: Diagnosis not present

## 2021-11-07 NOTE — Therapy (Signed)
[place] ?[facility] ?[place] ?[place], [place], [address] ?Phone: [phone]   Fax:  [phone] ? ?Physical Therapy Treatment ? ?Patient Details  ?Name: Cassie White ?MRN: [id number] ?Date of Birth: Nov 14, 1940 ?Referring Provider (PT): Alonza Bogus, DO ? ? ?Encounter Date: 11/07/2021 ? ? PT End of Session - 11/07/21 1300   ? ? Visit Number 7   ? Number of Visits 14   ? Date for PT Re-Evaluation 12/27/21   ? PT Start Time 1302   ? PT Stop Time 1342   ? PT Time Calculation (min) 40 min   ? Equipment Utilized During Treatment Other (comment)   rollator  ? Activity Tolerance Patient tolerated treatment well   ? Behavior During Therapy [facility] for tasks assessed/performed   ? ?  ?  ? ?  ? ? ?Past Medical History:  ?Diagnosis Date  ? Abnormal mammogram of left breast   ? Likely benign microcalcifications--repeat L diag mammo 03/24/2017.  COMPLEX SCLEROSING LESION WITH CALCIFICATIONS --no sign of malignancy.  Excision recommended--pt has been referred to Dr. Donne Hazel, who recommended f/u mammo and this was normal 12/2017--consider rpt 1 yr per rad.  ? Arthritis   ? DJD, back and both hips  ? Chronic low back pain without sciatica   ? Lumbar spondylosis + scoliosis.  Summer 2017 Dr. Nelva Bush did ESI and pt got no relief.  Pt then saw Dr. Maia Petties [facility]: felt she had facet mediated pain; plan for B L345 MBB (??).  ? Cystocele, midline   ? Diverticulosis   ? on colonoscopy 2004  ? GERD (gastroesophageal reflux disease)   ? Hx of cardiovascular stress test   ? Lexiscan Myoview (2/14):  Low risk, small ant defect likely breast attenuation, no ischemia, EF 69% (no change from 2010).    ? Hyperlipidemia   ? Hypertension   ? per pt  ? Idiopathic scoliosis of thoracolumbar region   ? Lumbar back pain   ? Mitral regurgitation   ? Echo (2/14):  EF 60-65%, Gr 1 DD, mild AI, mild bileaflet MVP, mod post directed MR, mild LAE, PASP 35.  ? MVP (mitral valve prolapse)   ? Neuropathy 2019   ? 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  Responding to gabapentin (Dr. Brett Fairy).  ? Osteoporosis, senile   ? stable left hip 2015 (took fosamax x 16yr, T-score -2.7 when she stopped bisphosphonate).  Rpt DEXA 265moater (09/2016) T-score -.3.1 (different machine, though). 11/2019 DEXA T-score -4. Recommended restart of fosamax  ? PAF (paroxysmal atrial fibrillation) ([facility]  ? Dr. JoMartiniqueFlecainide, metopr, apxiaban.  08/2017-->persistent a-fib w/RVR, flecainide increased and she converted back to NSR and felt better.  '100mg'$  bid flecainide continued.  ? Parkinson's disease ([facility]2020 dx  ? started sinemet 05/2019 (Dr. TaCarles Collet ? Peripheral neuropathy 2008  ? Idiopathic vs familial (motor>sensory) vs lumbar disc dz: gabapentin helpful  ? Shoulder pain left  ? RC surg 09/2014  ? ? ?Past Surgical History:  ?Procedure Laterality Date  ? BREAST BIOPSY  04/10/2017  ? Left breast core needle biopsy of calcifications.  COMPLEX SCLEROSING LESION WITH CALCIFICATIONS --no sign of malignancy.  Excision recommended--pt has been referred to Dr. WaDonne Hazelwho recommended rpt mammo and this was NORMAL 12/2017.  ? BREAST EXCISIONAL BIOPSY Right 2010  ? Benign  ? BREAST SURGERY  Nov 2010  ? Benign biopsy, right  ? CHOLECYSTECTOMY  03/2010  ? Dr.Gross  ? COLONOSCOPY  10/03/2002  ?  No polyps.  Repeat 10 yrs recommended but pt declines.  Pt declined cologuard 08/2016 but changed her mind and this test was NEG on 03/23/17.  ? COMBINED HYSTERECTOMY VAGINAL / OOPHORECTOMY / A&P REPAIR  4235  ? uncertain if ovaries were removed or not  ? CYSTOCELE REPAIR    ? DEXA  11/2019  ? T score -4  ? Lumpectomy  06/2009  ? "Fatty Necrosis"  ? PFT's  2014  ? NORMAL  ? REVERSE SHOULDER ARTHROPLASTY Right 03/11/2019  ? Procedure: REVERSE TOTAL SHOULDER ARTHROPLASTY;  Surgeon: Netta Cedars, MD;  Location: [facility];  Service: Orthopedics;  Laterality: Right;  Intersclene block  ? ROTATOR CUFF REPAIR Left 10/02/14  ? Dr. Veverly Fells  ?  TONSILLECTOMY    ? TOTAL KNEE ARTHROPLASTY  09/2009  ? Right ;Dr Alvan Dame  ? TOTAL KNEE ARTHROPLASTY Left 03/10/2016  ? Procedure: TOTAL KNEE ARTHROPLASTY;  Surgeon: Paralee Cancel, MD;  Location: [facility];  Service: Orthopedics;  Laterality: Left;  ? TRANSTHORACIC ECHOCARDIOGRAM  09/2012; 09/27/15  ? 2014: EF 60-65%, Gr 1 DD, mild AI, mild bileaflet MVP, mod post directed MR, mild LAE, PASP 35.  Repeat 2017: EF 55-60%, mild AR, mild MVP and mild MV regurg.  ? VAGINAL HYSTERECTOMY    ? For Uterine Deviation   ? ? ?There were no vitals filed for this visit. ? ? Subjective Assessment - 11/07/21 1300   ? ? Subjective Patient reports that she is tired today from having a busy day this morning.   ? Pertinent History Parkinson's Disease, low back pain, bilateral TKA's,   ? How long can you walk comfortably? 10-15 minutes prior to haing to rest   ? Patient Stated Goals increase lower extremity strength and reduce her risk of falling   ? Currently in Pain? No/denies   ? ?  ?  ? ?  ? ? ? ? ? ? ? ? ? ? ? ? ? ? ? ? ? ? ? ? [place] Adult PT Treatment/Exercise - 11/07/21 0001   ? ?  ? Lumbar Exercises: Seated  ? Other Seated Lumbar Exercises Slouch overcorrect   3 minutes  ?  ? Knee/Hip Exercises: Aerobic  ? Nustep L5 x 15 minutes   ?  ? Knee/Hip Exercises: Standing  ? Lateral Step Up Both;1 set;10 reps;Hand Hold: 2;Step Height: 4"   ? Forward Step Up Step Height: 4";Hand Hold: 2;Both;2 sets;15 reps   6" step attempted, but unable to complete without significant UE support  ?  ? Knee/Hip Exercises: Seated  ? Long [employer] Both;Weights   2 minutes  ? Long Arc Quad Weight 5 lbs.   ? Marching Both;Weights   2 minutes  ? Marching Weights 5 lbs.   ? ?  ?  ? ?  ? ? ? ? ? ? ? ? ? ? ? ? ? ? ? PT Long Term Goals - 10/17/21 1723   ? ?  ? PT LONG TERM GOAL #1  ? Title Patient will be independent with her HEP.   ? Time 6   ? Period Weeks   ? Status New   ? Target Date 11/28/21   ?  ? PT LONG TERM GOAL #2  ? Title Patient will improve her TUG time to 13  seconds or less to reduce her risk of falls.   ? Time 6   ? Period Weeks   ? Status New   ? Target Date 11/28/21   ?  ? PT  LONG TERM GOAL #3  ? Title Patient will improve her 5x sit to stand time to 14 seconds or less to reduce her fall risk and improve her lower extremity power.   ? Time 6   ? Period Weeks   ? Status New   ? Target Date 11/28/21   ?  ? PT LONG TERM GOAL #4  ? Title Patient will report being able to stand at least 30 minutes for improved function completing her household activities.   ? Time 6   ? Period Weeks   ? Status New   ? Target Date 11/28/21   ? ?  ?  ? ?  ? ? ? ? ? ? ? ? Plan - 11/07/21 1300   ? ? Clinical Impression Statement Patient was progressed with lateral step ups and the rocker board for lower extremity strength with moderate difficulty. She required minimal cuing with today's standing interventions for improved upright posture for improved safety and mobility. She reported feeling alright upon the conclusion of treatment. She continues to require skilled physical therapy to address her remaining impairments to maximize her functional mobility.   ? Personal Factors and Comorbidities Time since onset of injury/illness/exacerbation;Comorbidity 3+   ? Comorbidities Parkinson's Disease (Dx September 2020) A Fib, neuropathy, osteoporosis.   ? Examination-Activity Limitations Transfers;Locomotion Level;Stand;Stairs   ? Examination-Participation Restrictions Meal Prep;Cleaning;Community Activity;Shop   ? Stability/Clinical Decision Making Evolving/Moderate complexity   ? Rehab Potential Good   ? PT Frequency 3x / week   ? PT Duration 6 weeks   ? PT Treatment/Interventions ADLs/Self Care Home Management;Balance training;Therapeutic exercise;Therapeutic activities;Functional mobility training;Stair training;Gait training;Neuromuscular re-education;Patient/family education   ? PT Next Visit Plan Nustep, lower extremity strengthening and balance interventions   ? Consulted and Agree with Plan  of Care Patient   ? ?  ?  ? ?  ? ? ?Patient will benefit from skilled therapeutic intervention in order to improve the following deficits and impairments:  Abnormal gait, Difficulty walking, Decreased bal

## 2021-11-11 ENCOUNTER — Ambulatory Visit: Payer: Medicare Other | Admitting: Physical Therapy

## 2021-11-11 ENCOUNTER — Encounter: Payer: Self-pay | Admitting: Physical Therapy

## 2021-11-11 DIAGNOSIS — M6281 Muscle weakness (generalized): Secondary | ICD-10-CM | POA: Diagnosis not present

## 2021-11-11 DIAGNOSIS — Z9181 History of falling: Secondary | ICD-10-CM

## 2021-11-11 NOTE — Therapy (Signed)
[place] ?[facility] ?[address] ?[place], [place], [address] ?Phone: [phone]   Fax:  [phone] ? ?Physical Therapy Treatment ? ?Patient Details  ?Name: Cassie White ?MRN: [id number] ?Date of Birth: 04/30/1941 ?Referring Provider (PT): Alonza Bogus, DO ? ? ?Encounter Date: 11/11/2021 ? ? PT End of Session - 11/11/21 1037   ? ? Visit Number 8   ? Number of Visits 14   ? Date for PT Re-Evaluation 12/27/21   ? PT Start Time 1033   ? PT Stop Time 1113   ? PT Time Calculation (min) 40 min   ? Equipment Utilized During Treatment Other (comment)   Rollator  ? Activity Tolerance Patient tolerated treatment well   ? Behavior During Therapy [facility] for tasks assessed/performed   ? ?  ?  ? ?  ? ? ?Past Medical History:  ?Diagnosis Date  ? Abnormal mammogram of left breast   ? Likely benign microcalcifications--repeat L diag mammo 03/24/2017.  COMPLEX SCLEROSING LESION WITH CALCIFICATIONS --no sign of malignancy.  Excision recommended--pt has been referred to Dr. Donne Hazel, who recommended f/u mammo and this was normal 12/2017--consider rpt 1 yr per rad.  ? Arthritis   ? DJD, back and both hips  ? Chronic low back pain without sciatica   ? Lumbar spondylosis + scoliosis.  Summer 2017 Dr. Nelva Bush did ESI and pt got no relief.  Pt then saw Dr. Maia [facility]: felt she had facet mediated pain; plan for B L345 MBB (??).  ? Cystocele, midline   ? Diverticulosis   ? on colonoscopy 2004  ? GERD (gastroesophageal reflux disease)   ? Hx of cardiovascular stress test   ? Lexiscan Myoview (2/14):  Low risk, small ant defect likely breast attenuation, no ischemia, EF 69% (no change from 2010).    ? Hyperlipidemia   ? Hypertension   ? per pt  ? Idiopathic scoliosis of thoracolumbar region   ? Lumbar back pain   ? Mitral regurgitation   ? Echo (2/14):  EF 60-65%, Gr 1 DD, mild AI, mild bileaflet MVP, mod post directed MR, mild LAE, PASP 35.  ? MVP (mitral valve prolapse)   ? Neuropathy 2019   ? 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  Responding to gabapentin (Dr. Brett Fairy).  ? Osteoporosis, senile   ? stable left hip 2015 (took fosamax x 26yr, T-score -2.7 when she stopped bisphosphonate).  Rpt DEXA 285moater (09/2016) T-score -.3.1 (different machine, though). 11/2019 DEXA T-score -4. Recommended restart of fosamax  ? [facility] [place]  ? Dr. JoMartiniqueFlecainide, metopr, apxiaban.  08/2017-->persistent a-fib w/RVR, flecainide increased and she converted back to NSR and felt better.  '100mg'$  bid flecainide continued.  ? Parkinson's disease ([facility]2020 dx  ? started sinemet 05/2019 (Dr. TaCarles Collet ? Peripheral neuropathy 2008  ? Idiopathic vs familial (motor>sensory) vs lumbar disc dz: gabapentin helpful  ? Shoulder pain left  ? RC surg 09/2014  ? ? ?Past Surgical History:  ?Procedure Laterality Date  ? BREAST BIOPSY  04/10/2017  ? Left breast core needle biopsy of calcifications.  COMPLEX SCLEROSING LESION WITH CALCIFICATIONS --no sign of malignancy.  Excision recommended--pt has been referred to Dr. WaDonne Hazelwho recommended rpt mammo and this was NORMAL 12/2017.  ? BREAST EXCISIONAL BIOPSY Right 2010  ? Benign  ? BREAST SURGERY  Nov 2010  ? Benign biopsy, right  ? CHOLECYSTECTOMY  03/2010  ? Dr.Gross  ? COLONOSCOPY  10/03/2002  ?  No polyps.  Repeat 10 yrs recommended but pt declines.  Pt declined cologuard 08/2016 but changed her mind and this test was NEG on 03/23/17.  ? COMBINED HYSTERECTOMY VAGINAL / OOPHORECTOMY / A&P REPAIR  8588  ? uncertain if ovaries were removed or not  ? CYSTOCELE REPAIR    ? DEXA  11/2019  ? T score -4  ? Lumpectomy  06/2009  ? "Fatty Necrosis"  ? PFT's  2014  ? NORMAL  ? REVERSE SHOULDER ARTHROPLASTY Right 03/11/2019  ? Procedure: REVERSE TOTAL SHOULDER ARTHROPLASTY;  Surgeon: Netta Cedars, MD;  Location: [facility];  Service: Orthopedics;  Laterality: Right;  Intersclene block  ? ROTATOR CUFF REPAIR Left 10/02/14  ? Dr. Veverly Fells  ?  TONSILLECTOMY    ? TOTAL KNEE ARTHROPLASTY  09/2009  ? Right ;Dr Alvan Dame  ? TOTAL KNEE ARTHROPLASTY Left 03/10/2016  ? Procedure: TOTAL KNEE ARTHROPLASTY;  Surgeon: Paralee Cancel, MD;  Location: [facility];  Service: Orthopedics;  Laterality: Left;  ? TRANSTHORACIC ECHOCARDIOGRAM  09/2012; 09/27/15  ? 2014: EF 60-65%, Gr 1 DD, mild AI, mild bileaflet MVP, mod post directed MR, mild LAE, PASP 35.  Repeat 2017: EF 55-60%, mild AR, mild MVP and mild MV regurg.  ? VAGINAL HYSTERECTOMY    ? For Uterine Deviation   ? ? ?There were no vitals filed for this visit. ? ? Subjective Assessment - 11/11/21 1036   ? ? Subjective No new complaints.   ? Pertinent History Parkinson's Disease, low back pain, bilateral TKA's,   ? How long can you walk comfortably? 10-15 minutes prior to haing to rest   ? Patient Stated Goals increase lower extremity strength and reduce her risk of falling   ? Currently in Pain? No/denies   ? ?  ?  ? ?  ? ? ? ? ? [facility] PT Assessment - 11/11/21 0001   ? ?  ? Assessment  ? Medical Diagnosis Parkinson's Disease   ? Referring Provider (PT) Alonza Bogus, DO   ? Next MD Visit 6 months   ? Prior Therapy yes   ?  ? Precautions  ? Precautions Fall   ? ?  ?  ? ?  ? ? ? ? ? ? ? ? ? ? ? ? ? ? ? ? [facility] Adult PT Treatment/Exercise - 11/11/21 0001   ? ?  ? Knee/Hip Exercises: Aerobic  ? Nustep L4 x 17 minutes   ?  ? Knee/Hip Exercises: Seated  ? Long [employer] Strengthening;Both;20 reps;Weights   ? Long Arc Quad Weight 3 lbs.   ? Ball Squeeze 2 mins   ? Clamshell with TheraBand Red   x15 reps  ? Other Seated Knee/Hip Exercises Heel/toe raises   ? Marching Strengthening;Both;15 reps;Weights   ? Marching Weights 3 lbs.   ? Hamstring Curl Strengthening;Both;15 reps;Limitations   ? Hamstring Limitations red tband   ? Sit to Sand 15 reps;with UE support   ? ?  ?  ? ?  ? ? ? ? ? ? ? ? ? ? ? ? ? ? ? PT Long Term Goals - 11/11/21 1121   ? ?  ? PT LONG TERM GOAL #1  ? Title Patient will be independent with her HEP.   ? Time 6   ? Period  Weeks   ? Status On-going   ? Target Date 11/28/21   ?  ? PT LONG TERM GOAL #2  ? Title Patient will improve her TUG time to 13 seconds or  less to reduce her risk of falls.   ? Time 6   ? Period Weeks   ? Status On-going   ? Target Date 11/28/21   ?  ? PT LONG TERM GOAL #3  ? Title Patient will improve her 5x sit to stand time to 14 seconds or less to reduce her fall risk and improve her lower extremity power.   ? Time 6   ? Period Weeks   ? Status On-going   ? Target Date 11/28/21   ?  ? PT LONG TERM GOAL #4  ? Title Patient will report being able to stand at least 30 minutes for improved function completing her household activities.   ? Time 6   ? Period Weeks   ? Status On-going   ? Target Date 11/28/21   ? ?  ?  ? ?  ? ? ? ? ? ? ? ? Plan - 11/11/21 1114   ? ? Clinical Impression Statement Patient presented in clinic with reports of some fatigue from [facility]. Patient guided through seated strengthening with some limited reps on certain exercises due to fatigue. UE support required for sit <> stands today due to fatigue and stability.   ? Personal Factors and Comorbidities Time since onset of injury/illness/exacerbation;Comorbidity 3+   ? Comorbidities Parkinson's Disease (Dx September 2020) A Fib, neuropathy, osteoporosis.   ? Examination-Activity Limitations Transfers;Locomotion Level;Stand;Stairs   ? Examination-Participation Restrictions Meal Prep;Cleaning;Community Activity;Shop   ? Stability/Clinical Decision Making Evolving/Moderate complexity   ? Rehab Potential Good   ? PT Frequency 3x / week   ? PT Duration 6 weeks   ? PT Treatment/Interventions ADLs/Self Care Home Management;Balance training;Therapeutic exercise;Therapeutic activities;Functional mobility training;Stair training;Gait training;Neuromuscular re-education;Patient/family education   ? PT Next Visit Plan Nustep, lower extremity strengthening and balance interventions   ? Consulted and Agree with Plan of Care Patient   ? ?  ?  ? ?   ? ? ?Patient will benefit from skilled therapeutic intervention in order to improve the following deficits and impairments:  Abnormal gait, Difficulty walking, Decreased balance, Decreased activity toleran

## 2021-11-14 ENCOUNTER — Telehealth: Payer: Self-pay | Admitting: Physical Therapy

## 2021-11-14 NOTE — Telephone Encounter (Signed)
SHe will not be here today 11/15/21, she will return on Monday ?

## 2021-11-15 ENCOUNTER — Ambulatory Visit: Payer: Medicare Other | Admitting: Physical Therapy

## 2021-11-18 ENCOUNTER — Encounter: Payer: Self-pay | Admitting: Physical Therapy

## 2021-11-18 ENCOUNTER — Ambulatory Visit: Payer: Medicare Other | Admitting: Physical Therapy

## 2021-11-18 DIAGNOSIS — M6281 Muscle weakness (generalized): Secondary | ICD-10-CM | POA: Diagnosis not present

## 2021-11-18 DIAGNOSIS — Z9181 History of falling: Secondary | ICD-10-CM | POA: Diagnosis not present

## 2021-11-18 NOTE — Therapy (Signed)
[place] ?[facility] ?[address] ?[place], [place], [address] ?Phone: [phone]   Fax:  [phone] ? ?Physical Therapy Treatment ? ?Patient Details  ?Name: Cassie White ?MRN: [id number] ?Date of Birth: 11/20/1940 ?Referring Provider (PT): Alonza Bogus, DO ? ? ?Encounter Date: 11/18/2021 ? ? PT End of Session - 11/18/21 1033   ? ? Visit Number 9   ? Number of Visits 14   ? Date for PT Re-Evaluation 12/27/21   ? PT Start Time 1032   ? PT Stop Time 1105   ? PT Time Calculation (min) 33 min   ? Equipment Utilized During Treatment Other (comment)   Rollator  ? Activity Tolerance Patient tolerated treatment well   ? Behavior During Therapy [facility] for tasks assessed/performed   ? ?  ?  ? ?  ? ? ?Past Medical History:  ?Diagnosis Date  ? Abnormal mammogram of left breast   ? Likely benign microcalcifications--repeat L diag mammo 03/24/2017.  COMPLEX SCLEROSING LESION WITH CALCIFICATIONS --no sign of malignancy.  Excision recommended--pt has been referred to Dr. Donne Hazel, who recommended f/u mammo and this was normal 12/2017--consider rpt 1 yr per rad.  ? Arthritis   ? DJD, back and both hips  ? Chronic low back pain without sciatica   ? Lumbar spondylosis + scoliosis.  Summer 2017 Dr. Nelva Bush did ESI and pt got no relief.  Pt then saw Dr. Maia Petties [facility]: felt she had facet mediated pain; plan for B L345 MBB (??).  ? Cystocele, midline   ? Diverticulosis   ? on colonoscopy 2004  ? GERD (gastroesophageal reflux disease)   ? Hx of cardiovascular stress test   ? Lexiscan Myoview (2/14):  Low risk, small ant defect likely breast attenuation, no ischemia, EF 69% (no change from 2010).    ? Hyperlipidemia   ? Hypertension   ? per pt  ? Idiopathic scoliosis of thoracolumbar region   ? Lumbar back pain   ? Mitral regurgitation   ? Echo (2/14):  EF 60-65%, Gr 1 DD, mild AI, mild bileaflet MVP, mod post directed MR, mild LAE, PASP 35.  ? MVP (mitral valve prolapse)   ? Neuropathy 2019   ? 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  Responding to gabapentin (Dr. Brett Fairy).  ? Osteoporosis, senile   ? stable left hip 2015 (took fosamax x 9yr, T-score -2.7 when she stopped bisphosphonate).  Rpt DEXA 22moater (09/2016) T-score -.3.1 (different machine, though). 11/2019 DEXA T-score -4. Recommended restart of fosamax  ? [place] [facility]  ? Dr. JoMartiniqueFlecainide, metopr, apxiaban.  08/2017-->persistent a-fib w/RVR, flecainide increased and she converted back to NSR and felt better.  '100mg'$  bid flecainide continued.  ? Parkinson's disease ([facility]2020 dx  ? started sinemet 05/2019 (Dr. TaCarles Collet ? Peripheral neuropathy 2008  ? Idiopathic vs familial (motor>sensory) vs lumbar disc dz: gabapentin helpful  ? Shoulder pain left  ? RC surg 09/2014  ? ? ?Past Surgical History:  ?Procedure Laterality Date  ? BREAST BIOPSY  04/10/2017  ? Left breast core needle biopsy of calcifications.  COMPLEX SCLEROSING LESION WITH CALCIFICATIONS --no sign of malignancy.  Excision recommended--pt has been referred to Dr. WaDonne Hazelwho recommended rpt mammo and this was NORMAL 12/2017.  ? BREAST EXCISIONAL BIOPSY Right 2010  ? Benign  ? BREAST SURGERY  Nov 2010  ? Benign biopsy, right  ? CHOLECYSTECTOMY  03/2010  ? Dr.Gross  ? COLONOSCOPY  10/03/2002  ?  No polyps.  Repeat 10 yrs recommended but pt declines.  Pt declined cologuard 08/2016 but changed her mind and this test was NEG on 03/23/17.  ? COMBINED HYSTERECTOMY VAGINAL / OOPHORECTOMY / A&P REPAIR  2595  ? uncertain if ovaries were removed or not  ? CYSTOCELE REPAIR    ? DEXA  11/2019  ? T score -4  ? Lumpectomy  06/2009  ? "Fatty Necrosis"  ? PFT's  2014  ? NORMAL  ? REVERSE SHOULDER ARTHROPLASTY Right 03/11/2019  ? Procedure: REVERSE TOTAL SHOULDER ARTHROPLASTY;  Surgeon: Netta Cedars, MD;  [facility]: [facility];  Service: Orthopedics;  Laterality: Right;  Intersclene block  ? ROTATOR CUFF REPAIR Left 10/02/14  ? Dr. Veverly Fells  ?  TONSILLECTOMY    ? TOTAL KNEE ARTHROPLASTY  09/2009  ? Right ;Dr Alvan Dame  ? TOTAL KNEE ARTHROPLASTY Left 03/10/2016  ? Procedure: TOTAL KNEE ARTHROPLASTY;  Surgeon: Paralee Cancel, MD;  [facility];  Service: Orthopedics;  Laterality: Left;  ? TRANSTHORACIC ECHOCARDIOGRAM  09/2012; 09/27/15  ? 2014: EF 60-65%, Gr 1 DD, mild AI, mild bileaflet MVP, mod post directed MR, mild LAE, PASP 35.  Repeat 2017: EF 55-60%, mild AR, mild MVP and mild MV regurg.  ? VAGINAL HYSTERECTOMY    ? For Uterine Deviation   ? ? ?There were no vitals filed for this visit. ? ? Subjective Assessment - 11/18/21 1032   ? ? Subjective Reports that she is fair today.   ? Pertinent History Parkinson's Disease, low back pain, bilateral TKA's,   ? How long can you walk comfortably? 10-15 minutes prior to haing to rest   ? Patient Stated Goals increase lower extremity strength and reduce her risk of falling   ? Currently in Pain? No/denies   ? ?  ?  ? ?  ? ? ? ? ? [facility] PT Assessment - 11/18/21 0001   ? ?  ? Assessment  ? Medical Diagnosis Parkinson's Disease   ? Referring Provider (PT) Alonza Bogus, DO   ? Next MD Visit 6 months   ? Prior Therapy yes   ?  ? Precautions  ? Precautions Fall   ? ?  ?  ? ?  ? ? ? ? ? ? ? ? ? ? ? ? ? ? ? ? [facility] Adult PT Treatment/Exercise - 11/18/21 0001   ? ?  ? Knee/Hip Exercises: Aerobic  ? Nustep L4 x 17 minutes   ?  ? Knee/Hip Exercises: Seated  ? Long [employer] Strengthening;Both;20 reps;Weights   ? Long Arc Quad Weight 4 lbs.   ? Clamshell with TheraBand Red   x20 rpes  ? Other Seated Knee/Hip Exercises Heel/toe raises   ? Marching Strengthening;Both;15 reps;Weights   ? Marching Limitations red theraband   ? Hamstring Curl Strengthening;Both;15 reps;Limitations   ? Hamstring Limitations red tband   ? Sit to Sand 10 reps;with UE support   ? ?  ?  ? ?  ? ? ? ? ? ? ? ? ? ? ? ? ? ? ? PT Long Term Goals - 11/11/21 1121   ? ?  ? PT LONG TERM GOAL #1  ? Title Patient will be independent with her HEP.   ? Time 6   ? Period  Weeks   ? Status On-going   ? Target Date 11/28/21   ?  ? PT LONG TERM GOAL #2  ? Title Patient will improve her TUG time to 13 seconds or less to reduce her  risk of falls.   ? Time 6   ? Period Weeks   ? Status On-going   ? Target Date 11/28/21   ?  ? PT LONG TERM GOAL #3  ? Title Patient will improve her 5x sit to stand time to 14 seconds or less to reduce her fall risk and improve her lower extremity power.   ? Time 6   ? Period Weeks   ? Status On-going   ? Target Date 11/28/21   ?  ? PT LONG TERM GOAL #4  ? Title Patient will report being able to stand at least 30 minutes for improved function completing her household activities.   ? Time 6   ? Period Weeks   ? Status On-going   ? Target Date 11/28/21   ? ?  ?  ? ?  ? ? ? ? ? ? ? ? Plan - 11/18/21 1109   ? ? Clinical Impression Statement Patient presented in clinic with reports of weakness today. Patient limited with standing activities and excessive reps due to muscle fatigue. Patient completing transfers more slowly and ambulating at slower pace today as well. Eccentric control support via LEs as well as UE support required for sit <> stands. Patient's daughter was waiting in the waiting room for patient upon end of treatment as patient denied any staff assist from PTA to her car.   ? Personal Factors and Comorbidities Time since onset of injury/illness/exacerbation;Comorbidity 3+   ? Comorbidities Parkinson's Disease (Dx September 2020) A Fib, neuropathy, osteoporosis.   ? Examination-Activity Limitations Transfers;Locomotion Level;Stand;Stairs   ? Examination-Participation Restrictions Meal Prep;Cleaning;Community Activity;Shop   ? Stability/Clinical Decision Making Evolving/Moderate complexity   ? Rehab Potential Good   ? PT Frequency 3x / week   ? PT Duration 6 weeks   ? PT Treatment/Interventions ADLs/Self Care Home Management;Balance training;Therapeutic exercise;Therapeutic activities;Functional mobility training;Stair training;Gait  training;Neuromuscular re-education;Patient/family education   ? PT Next Visit Plan Nustep, lower extremity strengthening and balance interventions   ? Consulted and Agree with Plan of Care Patient   ? ?  ?  ? ?  ? ? ?[place]

## 2021-11-26 ENCOUNTER — Encounter: Payer: Self-pay | Admitting: Physical Therapy

## 2021-11-26 ENCOUNTER — Ambulatory Visit: Payer: Medicare Other | Admitting: Physical Therapy

## 2021-11-26 DIAGNOSIS — M6281 Muscle weakness (generalized): Secondary | ICD-10-CM | POA: Diagnosis not present

## 2021-11-26 DIAGNOSIS — Z9181 History of falling: Secondary | ICD-10-CM | POA: Diagnosis not present

## 2021-11-26 NOTE — Therapy (Addendum)
[facility] [address] [place], [place], [address] Phone: [phone]   Fax:  [phone]  Physical Therapy Treatment  Patient Details  Name: Cassie White MRN: [id number] Date of Birth: 06/05/1941 Referring Provider (PT): Kerin Salen, DO   Encounter Date: 11/26/2021   PT End of Session - 11/26/21 0952     Visit Number 10    Number of Visits 14    Date for PT Re-Evaluation 12/27/21    PT Start Time 0950    PT Stop Time 1026    PT Time Calculation (min) 36 min    Equipment Utilized During Treatment Other (comment)   Rollator   Activity Tolerance Patient tolerated treatment well    Behavior During Therapy [facility] for tasks assessed/performed             Past Medical History:  Diagnosis Date   Abnormal mammogram of left breast    Likely benign microcalcifications--repeat L diag mammo 03/24/2017.  COMPLEX SCLEROSING LESION WITH CALCIFICATIONS --no sign of malignancy.  Excision recommended--pt has been referred to Dr. Dwain Sarna, who recommended f/u mammo and this was normal 12/2017--consider rpt 1 yr per rad.   Arthritis    DJD, back and both hips   Chronic low back pain without sciatica    Lumbar spondylosis + scoliosis.  Summer 2017 Dr. Ethelene Hal did ESI and pt got no relief.  Pt then saw Dr. Retia Passe [facility]: felt she had facet mediated pain; plan for B L345 MBB (??).   Cystocele, midline    Diverticulosis    on colonoscopy 2004   GERD (gastroesophageal reflux disease)    Hx of cardiovascular stress test    Lexiscan Myoview (2/14):  Low risk, small ant defect likely breast attenuation, no ischemia, EF 69% (no change from 2010).     Hyperlipidemia    Hypertension    per pt   Idiopathic scoliosis of thoracolumbar region    Lumbar back pain    Mitral regurgitation    Echo (2/14):  EF 60-65%, Gr 1 DD, mild AI, mild bileaflet MVP, mod post directed MR, mild LAE, PASP 35.   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  Responding to gabapentin (Dr. Vickey Huger).   Osteoporosis, senile    stable left hip 2015 (took fosamax x 19yrs, T-score -2.7 when she stopped bisphosphonate).  Rpt DEXA 1mo later (09/2016) T-score -.3.1 (different machine, though). 11/2019 DEXA T-score -4. Recommended restart of fosamax   PAF (paroxysmal atrial fibrillation) (HCC)    Dr. [name]: Flecainide, metopr, apxiaban.  08/2017-->persistent a-fib w/RVR, flecainide increased and she converted back to NSR and felt better.  100mg  bid flecainide continued.   Parkinson's disease (HCC) 2020 dx   started sinemet 05/2019 (Dr. Arbutus Leas)   Peripheral neuropathy 2008   Idiopathic vs familial (motor>sensory) vs lumbar disc dz: gabapentin helpful   Shoulder pain left   RC surg 09/2014    Past Surgical History:  Procedure Laterality Date   BREAST BIOPSY  04/10/2017   Left breast core needle biopsy of calcifications.  COMPLEX SCLEROSING LESION WITH CALCIFICATIONS --no sign of malignancy.  Excision recommended--pt has been referred to Dr. Dwain Sarna, who recommended rpt mammo and this was NORMAL 12/2017.   BREAST EXCISIONAL BIOPSY Right 2010   Benign   BREAST SURGERY  Nov 2010   Benign biopsy, right   CHOLECYSTECTOMY  03/2010   Dr.Gross   COLONOSCOPY  10/03/2002  No polyps.  Repeat 10 yrs recommended but pt declines.  Pt declined cologuard 08/2016 but changed her mind and this test was NEG on 03/23/17.   COMBINED HYSTERECTOMY VAGINAL / OOPHORECTOMY / A&P REPAIR  1990   uncertain if ovaries were removed or not   CYSTOCELE REPAIR     DEXA  11/2019   T score -4   Lumpectomy  06/2009   "Fatty Necrosis"   PFT's  2014   NORMAL   REVERSE SHOULDER ARTHROPLASTY Right 03/11/2019   Procedure: REVERSE TOTAL SHOULDER ARTHROPLASTY;  Surgeon: Beverely Low, MD;  Location: [facility];  Service: Orthopedics;  Laterality: Right;  Intersclene block   ROTATOR CUFF REPAIR Left 10/02/14   Dr. Ranell Patrick    TONSILLECTOMY     TOTAL KNEE ARTHROPLASTY  09/2009   Right ;Dr Charlann Boxer   TOTAL KNEE ARTHROPLASTY Left 03/10/2016   Procedure: TOTAL KNEE ARTHROPLASTY;  Surgeon: Durene Romans, MD;  Location: [facility];  Service: Orthopedics;  Laterality: Left;   TRANSTHORACIC ECHOCARDIOGRAM  09/2012; 09/27/15   2014: EF 60-65%, Gr 1 DD, mild AI, mild bileaflet MVP, mod post directed MR, mild LAE, PASP 35.  Repeat 2017: EF 55-60%, mild AR, mild MVP and mild MV regurg.   VAGINAL HYSTERECTOMY     For Uterine Deviation     There were no vitals filed for this visit.   Subjective Assessment - 11/26/21 0951     Subjective Reports she feels rushed but okay.    Pertinent History Parkinson's Disease, low back pain, bilateral TKA's,    How long can you walk comfortably? 10-15 minutes prior to haing to rest    Patient Stated Goals increase lower extremity strength and reduce her risk of falling    Currently in Pain? No/denies                [facility] PT Assessment - 11/26/21 0001       Assessment   Medical Diagnosis Parkinson's Disease    Referring Provider (PT) Kerin Salen, DO    Next MD Visit 6 months    Prior Therapy yes      Precautions   Precautions Fall                           [facility] Adult PT Treatment/Exercise - 11/26/21 0001       Knee/Hip Exercises: Aerobic   Nustep L4 x 17 minutes      Knee/Hip Exercises: Standing   Hip Flexion Both;20 reps;Knee bent    Hip Abduction AROM;Both;20 reps;Knee straight    Forward Step Up Both;20 reps;Hand Hold: 2;Step Height: 4"      Knee/Hip Exercises: Seated   Long Arc Quad Strengthening;Both;20 reps;Weights    Long Arc Quad Weight 4 lbs.    Clamshell with TheraBand Red   x30 reps   Marching Strengthening;Both;20 reps    Marching Limitations red theraband    Hamstring Curl Strengthening;Both;20 reps;Limitations    Hamstring Limitations red tband    Sit to Sand 15 reps;with UE support                          PT Long Term  Goals - 11/11/21 1121       PT LONG TERM GOAL #1   Title Patient will be independent with her HEP.    Time 6    Period Weeks    Status On-going    Target Date 11/28/21  PT LONG TERM GOAL #2   Title Patient will improve her TUG time to 13 seconds or less to reduce her risk of falls.    Time 6    Period Weeks    Status On-going    Target Date 11/28/21      PT LONG TERM GOAL #3   Title Patient will improve her 5x sit to stand time to 14 seconds or less to reduce her fall risk and improve her lower extremity power.    Time 6    Period Weeks    Status On-going    Target Date 11/28/21      PT LONG TERM GOAL #4   Title Patient will report being able to stand at least 30 minutes for improved function completing her household activities.    Time 6    Period Weeks    Status On-going    Target Date 11/28/21                   Plan - 11/26/21 1038     Clinical Impression Statement Patient presented in clinic with reports of feeling rushed but knows that she should do more exercises while at home. Patient indicates that her LE do feel stronger when she is going around the house and last fall was earlier this year. Patient had more exercise tolerance today and less fatigue. Patient able to complete sit <> stands with less struggle.    Personal Factors and Comorbidities Time since onset of injury/illness/exacerbation;Comorbidity 3+    Comorbidities Parkinson's Disease (Dx September 2020) A Fib, neuropathy, osteoporosis.    Examination-Activity Limitations Transfers;Locomotion Level;Stand;Stairs    Examination-Participation Restrictions Meal Prep;Cleaning;Community Activity;Shop    Stability/Clinical Decision Making Evolving/Moderate complexity    Rehab Potential Good    PT Frequency 3x / week    PT Duration 6 weeks    PT Treatment/Interventions ADLs/Self Care Home Management;Balance training;Therapeutic exercise;Therapeutic activities;Functional mobility training;Stair  training;Gait training;Neuromuscular re-education;Patient/family education    PT Next Visit Plan Nustep, lower extremity strengthening and balance interventions    Consulted and Agree with Plan of Care Patient             Patient will benefit from skilled therapeutic intervention in order to improve the following deficits and impairments:  Abnormal gait, Difficulty walking, Decreased balance, Decreased activity tolerance, Decreased strength, Postural dysfunction, Pain, Decreased endurance, Decreased safety awareness  Visit Diagnosis: History of falling  Muscle weakness (generalized)     Problem List Patient Active Problem List   Diagnosis Date Noted   Aftercare 04/25/2019   S/P shoulder replacement, 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   Pain in joint of right shoulder 01/10/2019   Pain in both feet 12/14/2017   Neuropathy 12/14/2017   Benign essential tremor 12/14/2017   Paroxysmal atrial fibrillation (HCC) 12/14/2017   Chronic low back pain without sciatica 12/14/2017   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   Snoring 05/19/2017   S/P knee replacement 03/10/2016   Female bladder prolapse 12/06/2015   Paroxysmal atrial fibrillation with rapid ventricular response (HCC) 09/28/2015   Maxillary sinusitis, acute 08/27/2015   Mitral insufficiency 05/12/2014   HTN (hypertension) 04/28/2014   Lactic acidosis 09/07/2013   Mitral valve prolapse 06/14/2013   Chest pain, exertional 09/13/2012   Pernicious anemia 09/07/2012   DIVERTICULOSIS, COLON 09/06/2009   Osteoporosis 09/06/2009   Hyperlipidemia 09/04/2009   Peripheral sensory neuropathy 09/04/2009   LOW BACK PAIN, CHRONIC 04/08/2007    Marvell Fuller, PTA 11/26/2021, 10:41 AM  [facility] [address] [place], [place], [address] Phone: [phone]   Fax:  [phone]  Name: Cassie White MRN: [id number] Date of Birth: 07-13-1941  Progress Note Reporting Period 10/17/21 to  11/26/21  See note below for Objective Data and Assessment of Progress/Goals.   Patient is making fair progress with skilled physical therapy as evidenced by her progress toward her goals. Recommend that she continue with her recommended plan of care to address her remaining impairments to maximize her functional mobility and safety.   Candi Leash, PT, DPT

## 2021-11-27 ENCOUNTER — Other Ambulatory Visit: Payer: Self-pay | Admitting: Neurology

## 2021-11-27 DIAGNOSIS — G20 Parkinson's disease: Secondary | ICD-10-CM

## 2021-11-27 DIAGNOSIS — G20A1 Parkinson's disease without dyskinesia, without mention of fluctuations: Secondary | ICD-10-CM

## 2021-12-01 NOTE — Progress Notes (Signed)
?  ?Cardiology Office Note ? ? ?Date:  12/10/2021  ? ?ID:  Cassie White, DOB 18-Sep-1940, MRN [id number] ? ?PCP:  Tammi Sou, MD  ?Cardiologist:  Gerrad Welker [place], MD ?EP: None ? ?Chief Complaint  ?Patient presents with  ? Follow-up  ? ? ?  ?History of Present Illness: ?Cassie White is a 81 y.o. female with PMH of paroxysmal atrial fibrillation, MVP with mitral regurgitation, HLD, and GERD, who presents for follow up. ? ?She has a history of Afib managed with  flecainide. She is on Eliquis for anticoagulation.  Her last ischemic evaluation was a NST in 2017 to rule out pro arrhythmia and the ischemic heart disease, there was moderate sized and intensity partially reversible anterior and apical defect, EF was 64%, likely representing shifting breast artifact, overall it was considered intermediate risk study. I personally reviewed the images and felt this was low risk. She has no anginal symptoms.  Her last echocardiogram in 2017 showed EF 55-60%, mild AI, mild MR, with peak PA pressures 36 mmHg. ? ?She is followed by Neurology for Parkinson's disease.  ? ?She has been doing well from a cardiac standpoint since her last visit. She is limited in activity by back pain. Ambulates with a walker. She has had 3 mechanical falls without injury. She is getting PT to help with strength and balance. She denies any chest pain or SOB. Has very minor palpitations.  No complaints of dizziness, lightheadedness, syncope, LE edema, orthopnea, or PND. No bleeding on Eliquis ? ? ? ?Past Medical History:  ?Diagnosis Date  ? Abnormal mammogram of left breast   ? Likely benign microcalcifications--repeat L diag mammo 03/24/2017.  COMPLEX SCLEROSING LESION WITH CALCIFICATIONS --no sign of malignancy.  Excision recommended--pt has been referred to Dr. Donne Hazel, who recommended f/u mammo and this was normal 12/2017--consider rpt 1 yr per rad.  ? Arthritis   ? DJD, back and both hips  ? Chronic low back pain without sciatica   ? Lumbar  spondylosis + scoliosis.  Summer 2017 Dr. Nelva Bush did ESI and pt got no relief.  Pt then saw Dr. Maia Petties [facility]: felt she had facet mediated pain; plan for B L345 MBB (??).  ? Cystocele, midline   ? Diverticulosis   ? on colonoscopy 2004  ? GERD (gastroesophageal reflux disease)   ? Hx of cardiovascular stress test   ? Lexiscan Myoview (2/14):  Low risk, small ant defect likely breast attenuation, no ischemia, EF 69% (no change from 2010).    ? Hyperlipidemia   ? Hypertension   ? per pt  ? Idiopathic scoliosis of thoracolumbar region   ? Lumbar back pain   ? Mitral regurgitation   ? Echo (2/14):  EF 60-65%, Gr 1 DD, mild AI, mild bileaflet MVP, mod post directed MR, mild LAE, PASP 35.  ? MVP (mitral valve prolapse)   ? Neuropathy 2019  ? 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  Responding to gabapentin (Dr. Brett Fairy).  ? Osteoporosis, senile   ? stable left hip 2015 (took fosamax x 56yr, T-score -2.7 when she stopped bisphosphonate).  Rpt DEXA 242moater (09/2016) T-score -.3.1 (different machine, though). 11/2019 DEXA T-score -4. Recommended restart of fosamax  ? PA[place] ([place]oxysmal [place] fibrillation[place]  ? Dr. JoMartiniqueFlecainide, metopr, apxiaban.  08/2017-->persistent a-fib w/RVR, flecainide increased and she converted back to NSR and felt better.  '100mg'$  bid flecainide continued.  ? [place]  ? started sinemet  05/2019 (Dr. Carles Collet)  ? Peripheral neuropathy 2008  ? Idiopathic vs familial (motor>sensory) vs lumbar disc dz: gabapentin helpful  ? Shoulder pain left  ? RC surg 09/2014  ? ? ?Past Surgical History:  ?Procedure Laterality Date  ? BREAST BIOPSY  04/10/2017  ? Left breast core needle biopsy of calcifications.  COMPLEX SCLEROSING LESION WITH CALCIFICATIONS --no sign of malignancy.  Excision recommended--pt has been referred to Dr. Donne Hazel, who recommended rpt mammo and this was NORMAL 12/2017.  ? BREAST EXCISIONAL BIOPSY Right 2010  ?  Benign  ? BREAST SURGERY  Nov 2010  ? Benign biopsy, right  ? CHOLECYSTECTOMY  03/2010  ? Dr.Gross  ? COLONOSCOPY  10/03/2002  ? No polyps.  Repeat 10 yrs recommended but pt declines.  Pt declined cologuard 08/2016 but changed her mind and this test was NEG on 03/23/17.  ? COMBINED HYSTERECTOMY VAGINAL / OOPHORECTOMY / A&P REPAIR  2426  ? uncertain if ovaries were removed or not  ? CYSTOCELE REPAIR    ? DEXA  11/2019  ? T score -4  ? Lumpectomy  06/2009  ? "Fatty Necrosis"  ? PFT's  2014  ? NORMAL  ? REVERSE SHOULDER ARTHROPLASTY Right 03/11/2019  ? Procedure: REVERSE TOTAL SHOULDER ARTHROPLASTY;  Surgeon: Netta Cedars, MD;  Location: [facility];  Service: Orthopedics;  Laterality: Right;  Intersclene block  ? ROTATOR CUFF REPAIR Left 10/02/14  ? Dr. Veverly Fells  ? TONSILLECTOMY    ? TOTAL KNEE ARTHROPLASTY  09/2009  ? Right ;Dr Alvan Dame  ? TOTAL KNEE ARTHROPLASTY Left 03/10/2016  ? Procedure: TOTAL KNEE ARTHROPLASTY;  Surgeon: Paralee Cancel, MD;  Location: [facility];  Service: Orthopedics;  Laterality: Left;  ? TRANSTHORACIC ECHOCARDIOGRAM  09/2012; 09/27/15  ? 2014: EF 60-65%, Gr 1 DD, mild AI, mild bileaflet MVP, mod post directed MR, mild LAE, PASP 35.  Repeat 2017: EF 55-60%, mild AR, mild MVP and mild MV regurg.  ? VAGINAL HYSTERECTOMY    ? For Uterine Deviation   ? ? ? ?Current Outpatient Medications  ?Medication Sig Dispense Refill  ? Biotin 5000 MCG CAPS Take 5,000 mcg by mouth daily.    ? Calcium Carbonate (CALCIUM 600 PO) Take 600 mg by mouth 2 (two) times daily.     ? carbidopa-levodopa (SINEMET IR) 25-100 MG tablet Take 2 tablets by mouth 3 (three) times daily. 540 tablet 1  ? cyanocobalamin (,VITAMIN B-12,) 1000 MCG/ML injection INJECT 1 ML (1,000 MCG TOTAL) INTO THE MUSCLE EVERY 30 DAYS. 3 mL 3  ? gabapentin (NEURONTIN) 300 MG capsule 2 caps po bid 360 capsule 3  ? metoprolol tartrate (LOPRESSOR) 25 MG tablet Take 1 tablet (25 mg total) by mouth 2 (two) times daily. 180 tablet 3  ? nitroGLYCERIN (NITROSTAT) 0.4 MG SL tablet  Place 1 tablet (0.4 mg total) under the tongue every 5 (five) minutes as needed for chest pain. 15 tablet 1  ? Omega-3 Fatty Acids (FISH OIL) 1200 MG CAPS Take 1,000 mg by mouth 2 (two) times daily.     ? raloxifene (EVISTA) 60 MG tablet Take 1 tablet (60 mg total) by mouth daily. 90 tablet 3  ? traZODone (DESYREL) 50 MG tablet Take 1 tablet (50 mg total) by mouth at bedtime. 90 tablet 1  ? apixaban (ELIQUIS) 5 MG TABS tablet Take 1 tablet (5 mg total) by mouth 2 (two) times daily. 180 tablet 3  ? flecainide (TAMBOCOR) 100 MG tablet Take 1 tablet (100 mg total) by mouth 2 (two) times daily.  Please keep upcoming appointment with Dr Martinique for any further refills. 180 tablet 3  ? pravastatin (PRAVACHOL) 40 MG tablet Take 1 tablet (40 mg total) by mouth daily. 90 tablet 3  ? ?No current facility-administered medications for this visit.  ? ? ?Allergies:   Sulfa antibiotics and Tizanidine  ? ? ?Social History:  The patient  reports that she has never smoked. She has never used smokeless tobacco. She reports that she does not drink alcohol and does not use drugs.  ? ?Family History:  The patient's family history includes COPD in her brother; Coronary artery disease in her brother; Healthy in her daughter and son; Heart attack in her father; Heart attack (age of onset: 48) in her son; Ovarian cancer in her mother; Stroke (age of onset: 87) in her father.  ? ? ?ROS:  Please see the history of present illness.   Otherwise, review of systems are positive for none.   All other systems are reviewed and negative.  ? ? ?PHYSICAL EXAM: ?VS:  BP 118/64 (BP Location: Left Arm, Patient Position: Sitting, Cuff Size: Normal)   Pulse (!) 55   Ht '5\' 1"'$  (1.549 m)   Wt 151 lb (68.5 kg)   SpO2 96%   BMI 28.53 kg/m?  , BMI Body mass index is 28.53 kg/m?. ?GEN: Well nourished, well developed, in no acute distress ?HEENT: normal ?Neck: no JVD, carotid bruits, or masses ?Cardiac: bradycardic, regular rhythm, no murmurs, rubs, or gallops,  no edema  ?Respiratory:  clear to auscultation bilaterally, normal work of breathing ?GI: soft, nontender, nondistended, + BS ?MS: no deformity or atrophy ?Skin: warm and dry, no rash ?Neuro:  Strength and sensati

## 2021-12-03 ENCOUNTER — Ambulatory Visit: Payer: Medicare Other | Attending: Neurology

## 2021-12-03 ENCOUNTER — Other Ambulatory Visit: Payer: Self-pay | Admitting: Cardiology

## 2021-12-03 DIAGNOSIS — M6281 Muscle weakness (generalized): Secondary | ICD-10-CM | POA: Insufficient documentation

## 2021-12-03 DIAGNOSIS — Z9181 History of falling: Secondary | ICD-10-CM | POA: Insufficient documentation

## 2021-12-03 NOTE — Telephone Encounter (Signed)
Prescription refill request for Eliquis received. ?Indication:afib ?Last office visit: [place] 07/12/20 ?Scr:0.9 09/06/21 ?Age: 19f?Weight:63kg ? ?

## 2021-12-03 NOTE — Therapy (Signed)
[place] ?[facility] ?[place] ?[place], [place], [address] ?Phone: [phone]   Fax:  [phone] ? ?Physical Therapy Treatment ? ?Patient Details  ?Name: Cassie White ?MRN: [id number] ?Date of Birth: [birth year] ?Referring Provider (PT): Alonza Bogus, DO ? ? ?Encounter Date: 12/03/2021 ? ? PT End of Session - 12/03/21 1437   ? ? Visit Number 11   ? Number of Visits 14   ? Date for PT Re-Evaluation 12/27/21   ? PT Start Time 1430   ? PT Stop Time 8850   ? PT Time Calculation (min) 44 min   ? Equipment Utilized During Treatment Other (comment)   Rollator  ? Activity Tolerance Patient tolerated treatment well   ? Behavior During Therapy [facility] for tasks assessed/performed   ? ?  ?  ? ?  ? ? ?Past Medical History:  ?Diagnosis Date  ? Abnormal mammogram of left breast   ? Likely benign microcalcifications--repeat L diag mammo 03/24/2017.  COMPLEX SCLEROSING LESION WITH CALCIFICATIONS --no sign of malignancy.  Excision recommended--pt has been referred to Dr. Donne Hazel, who recommended f/u mammo and this was normal 12/2017--consider rpt 1 yr per rad.  ? Arthritis   ? DJD, back and both hips  ? Chronic low back pain without sciatica   ? Lumbar spondylosis + scoliosis.  Summer 2017 Dr. Nelva Bush did ESI and pt got no relief.  Pt then saw Dr. Maia [facility]: felt she had facet mediated pain; plan for B L345 MBB (??).  ? Cystocele, midline   ? Diverticulosis   ? on colonoscopy 2004  ? GERD (gastroesophageal reflux disease)   ? Hx of cardiovascular stress test   ? Lexiscan Myoview (2/14):  Low risk, small ant defect likely breast attenuation, no ischemia, EF 69% (no change from 2010).    ? Hyperlipidemia   ? Hypertension   ? per pt  ? Idiopathic scoliosis of thoracolumbar region   ? Lumbar back pain   ? Mitral regurgitation   ? Echo (2/14):  EF 60-65%, Gr 1 DD, mild AI, mild bileaflet MVP, mod post directed MR, mild LAE, PASP 35.  ? MVP (mitral valve prolapse)   ? Neuropathy 2019   ? 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  Responding to gabapentin (Dr. Brett Fairy).  ? Osteoporosis, senile   ? stable left hip 2015 (took fosamax x 17yr, T-score -2.7 when she stopped bisphosphonate).  Rpt DEXA 216moater (09/2016) T-score -.3.1 (different machine, though). 11/2019 DEXA T-score -4. Recommended restart of fosamax  ? [facility]  ? Dr. JoMartiniqueFlecainide, metopr, apxiaban.  08/2017-->persistent a-fib w/RVR, flecainide increased and she converted back to NSR and felt better.  '100mg'$  bid flecainide continued.  ? Parkinson's disease (HCCalvert2020 dx  ? started sinemet 05/2019 (Dr. TaCarles Collet ? Peripheral neuropathy 2008  ? Idiopathic vs familial (motor>sensory) vs lumbar disc dz: gabapentin helpful  ? Shoulder pain left  ? RC surg 09/2014  ? ? ?Past Surgical History:  ?Procedure Laterality Date  ? BREAST BIOPSY  04/10/2017  ? Left breast core needle biopsy of calcifications.  COMPLEX SCLEROSING LESION WITH CALCIFICATIONS --no sign of malignancy.  Excision recommended--pt has been referred to Dr. WaDonne Hazelwho recommended rpt mammo and this was NORMAL 12/2017.  ? BREAST EXCISIONAL BIOPSY Right 2010  ? Benign  ? BREAST SURGERY  Nov 2010  ? Benign biopsy, right  ? CHOLECYSTECTOMY  03/2010  ? Dr.Gross  ? COLONOSCOPY  10/03/2002  ?  No polyps.  Repeat 10 yrs recommended but pt declines.  Pt declined cologuard 08/2016 but changed her mind and this test was NEG on 03/23/17.  ? COMBINED HYSTERECTOMY VAGINAL / OOPHORECTOMY / A&P REPAIR  8101  ? uncertain if ovaries were removed or not  ? CYSTOCELE REPAIR    ? DEXA  11/2019  ? T score -4  ? Lumpectomy  06/2009  ? "Fatty Necrosis"  ? PFT's  2014  ? NORMAL  ? REVERSE SHOULDER ARTHROPLASTY Right 03/11/2019  ? Procedure: REVERSE TOTAL SHOULDER ARTHROPLASTY;  Surgeon: Netta Cedars, MD;  Location: [facility];  Service: Orthopedics;  Laterality: Right;  Intersclene block  ? ROTATOR CUFF REPAIR Left 10/02/14  ? Dr. Veverly Fells  ?  TONSILLECTOMY    ? TOTAL KNEE ARTHROPLASTY  09/2009  ? Right ;Dr Alvan Dame  ? TOTAL KNEE ARTHROPLASTY Left 03/10/2016  ? Procedure: TOTAL KNEE ARTHROPLASTY;  Surgeon: Paralee Cancel, MD;  Location: [facility];  Service: Orthopedics;  Laterality: Left;  ? TRANSTHORACIC ECHOCARDIOGRAM  09/2012; 09/27/15  ? 2014: EF 60-65%, Gr 1 DD, mild AI, mild bileaflet MVP, mod post directed MR, mild LAE, PASP 35.  Repeat 2017: EF 55-60%, mild AR, mild MVP and mild MV regurg.  ? VAGINAL HYSTERECTOMY    ? For Uterine Deviation   ? ? ?There were no vitals filed for this visit. ? ? Subjective Assessment - 12/03/21 1437   ? ? Subjective Patient reports that she feels alright today.   ? Pertinent History Parkinson's Disease, low back pain, bilateral TKA's,   ? How long can you walk comfortably? 10-15 minutes prior to haing to rest   ? Patient Stated Goals increase lower extremity strength and reduce her risk of falling   ? Currently in Pain? No/denies   ? ?  ?  ? ?  ? ? ? ? ? ? ? ? ? ? ? ? ? ? ? ? ? ? ? ? [facility] PT Treatment/Exercise - 12/03/21 0001   ? ?  ? Knee/Hip Exercises: Aerobic  ? Nustep L4 x 17 minutes   ?  ? Knee/Hip Exercises: Standing  ? Forward Step Up Both;20 reps;Hand Hold: 2;Step Height: 4"   ? Step Down Both;10 reps;Hand Hold: 2;Step Height: 4"   limited due to fatigue  ?  ? Knee/Hip Exercises: Seated  ? Long [employer] Strengthening;Both;Weights   2 minutes  ? Long Arc Quad Weight 5 lbs.   ? Clamshell with TheraBand Red   2 minutes  ? Marching Strengthening;Both;Weights   2 minutes  ? ?  ?  ? ?  ? ? ? ? ? ? Balance Exercises - 12/03/21 0001   ? ?  ? Balance Exercises: Standing  ? Sidestepping Foam/compliant support;Other (comment)   2 minutes  ? ?  ?  ? ?  ? ? ? ? ? ? ? ? ? ? PT Long Term Goals - 11/11/21 1121   ? ?  ? PT LONG TERM GOAL #1  ? Title Patient will be independent with her HEP.   ? Time 6   ? Period Weeks   ? Status On-going   ? Target Date 11/28/21   ?  ? PT LONG TERM GOAL #2  ? Title Patient will improve her TUG time  to 13 seconds or less to reduce her risk of falls.   ? Time 6   ? Period Weeks   ? Status On-going   ? Target Date 11/28/21   ?  ?  PT LONG TERM GOAL #3  ? Title Patient will improve her 5x sit to stand time to 14 seconds or less to reduce her fall risk and improve her lower extremity power.   ? Time 6   ? Period Weeks   ? Status On-going   ? Target Date 11/28/21   ?  ? PT LONG TERM GOAL #4  ? Title Patient will report being able to stand at least 30 minutes for improved function completing her household activities.   ? Time 6   ? Period Weeks   ? Status On-going   ? Target Date 11/28/21   ? ?  ?  ? ?  ? ? ? ? ? ? ? ? Plan - 12/03/21 1438   ? ? Clinical Impression Statement Patient was progressed with familiar interventions for improved stability and strength. She required minimal cueing with today's standing interventions for improved foot clearance for improved safety. Fatigue was her primary limitation with today's interventions as she required multiple seated rest breaks throughout treatment. She reported feeling a little tired, but ok upon the conclusion of treatment. She continues to require skilled physical therapy to address her remaining impairments to maximize her safety and functional mobility.   ? Personal Factors and Comorbidities Time since onset of injury/illness/exacerbation;Comorbidity 3+   ? Comorbidities Parkinson's Disease (Dx September 2020) A Fib, neuropathy, osteoporosis.   ? Examination-Activity Limitations Transfers;Locomotion Level;Stand;Stairs   ? Examination-Participation Restrictions Meal Prep;Cleaning;Community Activity;Shop   ? Stability/Clinical Decision Making Evolving/Moderate complexity   ? Rehab Potential Good   ? PT Frequency 3x / week   ? PT Duration 6 weeks   ? PT Treatment/Interventions ADLs/Self Care Home Management;Balance training;Therapeutic exercise;Therapeutic activities;Functional mobility training;Stair training;Gait training;Neuromuscular re-education;Patient/family  education   ? PT Next Visit Plan Nustep, lower extremity strengthening and balance interventions   ? Consulted and Agree with Plan of Care Patient   ? ?  ?  ? ?  ? ? ?Patient will benefit from skilled th

## 2021-12-05 ENCOUNTER — Ambulatory Visit: Payer: Medicare Other | Admitting: *Deleted

## 2021-12-05 DIAGNOSIS — M6281 Muscle weakness (generalized): Secondary | ICD-10-CM

## 2021-12-05 DIAGNOSIS — Z9181 History of falling: Secondary | ICD-10-CM | POA: Diagnosis not present

## 2021-12-05 NOTE — Therapy (Signed)
[place] ?[facility] ?[place] ?[place], [place], [address] ?Phone: [phone]   Fax:  [phone] ? ?Physical Therapy Treatment ? ?Patient Details  ?Name: Cassie White ?MRN: [id number] ?Date of Birth: 10/18/1940 ?Referring Provider (PT): Alonza Bogus, DO ? ? ?Encounter Date: 12/05/2021 ? ? PT End of Session - 12/05/21 1147   ? ? Visit Number 12   ? Number of Visits 14   ? PT Start Time 1119   ? PT Stop Time 1205   ? PT Time Calculation (min) 46 min   ? ?  ?  ? ?  ? ? ?Past Medical History:  ?Diagnosis Date  ? Abnormal mammogram of left breast   ? Likely benign microcalcifications--repeat L diag mammo 03/24/2017.  COMPLEX SCLEROSING LESION WITH CALCIFICATIONS --no sign of malignancy.  Excision recommended--pt has been referred to Dr. Donne Hazel, who recommended f/u mammo and this was normal 12/2017--consider rpt 1 yr per rad.  ? Arthritis   ? DJD, back and both hips  ? Chronic low back pain without sciatica   ? Lumbar spondylosis + scoliosis.  Summer 2017 Dr. Nelva Bush did ESI and pt got no relief.  Pt then saw Dr. Maia Petties [facility]: felt she had facet mediated pain; plan for B L345 MBB (??).  ? Cystocele, midline   ? Diverticulosis   ? on colonoscopy 2004  ? GERD (gastroesophageal reflux disease)   ? Hx of cardiovascular stress test   ? Lexiscan Myoview (2/14):  Low risk, small ant defect likely breast attenuation, no ischemia, EF 69% (no change from 2010).    ? Hyperlipidemia   ? Hypertension   ? per pt  ? Idiopathic scoliosis of thoracolumbar region   ? Lumbar back pain   ? Mitral regurgitation   ? Echo (2/14):  EF 60-65%, Gr 1 DD, mild AI, mild bileaflet MVP, mod post directed MR, mild LAE, PASP 35.  ? MVP (mitral valve prolapse)   ? Neuropathy 2019  ? 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  Responding to gabapentin (Dr. Brett Fairy).  ? Osteoporosis, senile   ? stable left hip 2015 (took fosamax x 50yr, T-score -2.7 when she stopped  bisphosphonate).  Rpt DEXA 240moater (09/2016) T-score -.3.1 (different machine, though). 11/2019 DEXA T-score -4. Recommended restart of fosamax  ? [place]  ? Dr. JoMartiniqueFlecainide, metopr, apxiaban.  08/2017-->persistent a-fib w/RVR, flecainide increased and she converted back to NSR and felt better.  '100mg'$  bid flecainide continued.  ? Parkinson's disease (HCEarlimart2020 dx  ? started sinemet 05/2019 (Dr. TaCarles Collet ? Peripheral neuropathy 2008  ? Idiopathic vs familial (motor>sensory) vs lumbar disc dz: gabapentin helpful  ? Shoulder pain left  ? RC surg 09/2014  ? ? ?Past Surgical History:  ?Procedure Laterality Date  ? BREAST BIOPSY  04/10/2017  ? Left breast core needle biopsy of calcifications.  COMPLEX SCLEROSING LESION WITH CALCIFICATIONS --no sign of malignancy.  Excision recommended--pt has been referred to Dr. WaDonne Hazelwho recommended rpt mammo and this was NORMAL 12/2017.  ? BREAST EXCISIONAL BIOPSY Right 2010  ? Benign  ? BREAST SURGERY  Nov 2010  ? Benign biopsy, right  ? CHOLECYSTECTOMY  03/2010  ? Dr.Gross  ? COLONOSCOPY  10/03/2002  ? No polyps.  Repeat 10 yrs recommended but pt declines.  Pt declined cologuard 08/2016 but changed her mind and this test was NEG on 03/23/17.  ? COMBINED HYSTERECTOMY VAGINAL / OOPHORECTOMY / A&P REPAIR  1990  ?  uncertain if ovaries were removed or not  ? CYSTOCELE REPAIR    ? DEXA  11/2019  ? T score -4  ? Lumpectomy  06/2009  ? "Fatty Necrosis"  ? PFT's  2014  ? NORMAL  ? REVERSE SHOULDER ARTHROPLASTY Right 03/11/2019  ? Procedure: REVERSE TOTAL SHOULDER ARTHROPLASTY;  Surgeon: Netta Cedars, MD;  Location: [facility];  Service: Orthopedics;  Laterality: Right;  Intersclene block  ? ROTATOR CUFF REPAIR Left 10/02/14  ? Dr. Veverly Fells  ? TONSILLECTOMY    ? TOTAL KNEE ARTHROPLASTY  09/2009  ? Right ;Dr Alvan Dame  ? TOTAL KNEE ARTHROPLASTY Left 03/10/2016  ? Procedure: TOTAL KNEE ARTHROPLASTY;  Surgeon: Paralee Cancel, MD;  Location: [facility];  Service: Orthopedics;   Laterality: Left;  ? TRANSTHORACIC ECHOCARDIOGRAM  09/2012; 09/27/15  ? 2014: EF 60-65%, Gr 1 DD, mild AI, mild bileaflet MVP, mod post directed MR, mild LAE, PASP 35.  Repeat 2017: EF 55-60%, mild AR, mild MVP and mild MV regurg.  ? VAGINAL HYSTERECTOMY    ? For Uterine Deviation   ? ? ?There were no vitals filed for this visit. ? ? Subjective Assessment - 12/05/21 1315   ? ? Subjective Patient reports that she feels alright today and is doing better   ? Pertinent History Parkinson's Disease, low back pain, bilateral TKA's,   ? How long can you walk comfortably? 10-15 minutes prior to haing to rest   ? Patient Stated Goals increase lower extremity strength and reduce her risk of falling   ? ?  ?  ? ?  ? ? ? ? ? ? ? ? ? ? ? ? ? ? ? ? ? ? ? ? [facility] Adult PT Treatment/Exercise - 12/05/21 0001   ? ?  ? Knee/Hip Exercises: Aerobic  ? Nustep L4 x 15 minutes   ?  ? Knee/Hip Exercises: Standing  ? Heel Raises 2 sets;15 reps   ? Heel Raises Limitations Toe raises 2x 15-20 reps   ? Hip Flexion Both;20 reps;Knee bent;2 sets   ? Hip Abduction AROM;Both;Knee straight;2 sets;10 reps   ? Other Standing Knee Exercises Standing on airex pad x 5 mins total with CGA and UE diagonal reaches to challenge Pt.   ?  ? Knee/Hip Exercises: Seated  ? Long [employer] Strengthening;Both;Weights;2 sets   2xfatigue  ? Long Arc Quad Weight 5 lbs.   ? Clamshell with TheraBand Red   2 x fatigue  ? ?  ?  ? ?  ? ? ? ? ? ? ? ? ? ? ? ? ? ? ? PT Long Term Goals - 11/11/21 1121   ? ?  ? PT LONG TERM GOAL #1  ? Title Patient will be independent with her HEP.   ? Time 6   ? Period Weeks   ? Status On-going   ? Target Date 11/28/21   ?  ? PT LONG TERM GOAL #2  ? Title Patient will improve her TUG time to 13 seconds or less to reduce her risk of falls.   ? Time 6   ? Period Weeks   ? Status On-going   ? Target Date 11/28/21   ?  ? PT LONG TERM GOAL #3  ? Title Patient will improve her 5x sit to stand time to 14 seconds or less to reduce her fall risk and improve  her lower extremity power.   ? Time 6   ? Period Weeks   ? Status On-going   ?  Target Date 11/28/21   ?  ? PT LONG TERM GOAL #4  ? Title Patient will report being able to stand at least 30 minutes for improved function completing her household activities.   ? Time 6   ? Period Weeks   ? Status On-going   ? Target Date 11/28/21   ? ?  ?  ? ?  ? ? ? ? ? ? ? ? Plan - 12/05/21 1316   ? ? Clinical Impression Statement Pt arrived today ambulating with Rollator and doing fairly well. Rx focused on LE strengthening as well as balance and proprioceptive act's. She was able to finish all exs tday with LE fatigue mainly. She continues to progress towards LTG's.   ? Personal Factors and Comorbidities Time since onset of injury/illness/exacerbation;Comorbidity 3+;Profession   ? Comorbidities Parkinson's Disease (Dx September 2020) A Fib, neuropathy, osteoporosis.   ? Examination-Activity Limitations Transfers;Locomotion Level;Stand;Stairs   ? Examination-Participation Restrictions Meal Prep;Cleaning;Community Activity;Shop   ? Stability/Clinical Decision Making Evolving/Moderate complexity   ? Rehab Potential Good   ? PT Frequency 3x / week   ? PT Duration 6 weeks   ? PT Treatment/Interventions ADLs/Self Care Home Management;Balance training;Therapeutic exercise;Therapeutic activities;Functional mobility training;Stair training;Gait training;Neuromuscular re-education;Patient/family education   ? PT Next Visit Plan Nustep, lower extremity strengthening and balance interventions   ? Consulted and Agree with Plan of Care Patient   ? ?  ?  ? ?  ? ? ?Patient will benefit from skilled therapeutic intervention in order to improve the following deficits and impairments:  Abnormal gait, Difficulty walking, Decreased balance, Decreased activity tolerance, Decreased strength, Postural dysfunction, Pain, Decreased endurance, Decreased safety awareness ? ?Visit Diagnosis: ?History of falling ? ?Muscle weakness (generalized) ? ? ? ? ?Problem  List ?Patient Active Problem List  ? Diagnosis Date Noted  ? Aftercare 04/25/2019  ? 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 Pain in joint of right shoulder 01/10/2019  ? Pain in both feet 05/13/

## 2021-12-06 ENCOUNTER — Encounter: Payer: Medicare Other | Admitting: *Deleted

## 2021-12-10 ENCOUNTER — Ambulatory Visit: Payer: Medicare Other | Admitting: Cardiology

## 2021-12-10 ENCOUNTER — Other Ambulatory Visit: Payer: Self-pay | Admitting: Family Medicine

## 2021-12-10 ENCOUNTER — Encounter: Payer: Self-pay | Admitting: Cardiology

## 2021-12-10 VITALS — BP 118/64 | HR 55 | Ht 61.0 in | Wt 151.0 lb

## 2021-12-10 DIAGNOSIS — E785 Hyperlipidemia, unspecified: Secondary | ICD-10-CM

## 2021-12-10 DIAGNOSIS — I341 Nonrheumatic mitral (valve) prolapse: Secondary | ICD-10-CM | POA: Diagnosis not present

## 2021-12-10 DIAGNOSIS — I4891 Unspecified atrial fibrillation: Secondary | ICD-10-CM

## 2021-12-10 DIAGNOSIS — I480 Paroxysmal atrial fibrillation: Secondary | ICD-10-CM

## 2021-12-10 DIAGNOSIS — I340 Nonrheumatic mitral (valve) insufficiency: Secondary | ICD-10-CM

## 2021-12-10 MED ORDER — APIXABAN 5 MG PO TABS: 5.0000 mg | ORAL_TABLET | Freq: Two times a day (BID) | ORAL | 3 refills | Status: DC

## 2021-12-10 MED ORDER — FLECAINIDE ACETATE 100 MG PO TABS: 100.0000 mg | ORAL_TABLET | Freq: Two times a day (BID) | ORAL | 3 refills | Status: DC

## 2021-12-10 MED ORDER — PRAVASTATIN SODIUM 40 MG PO TABS: 40.0000 mg | ORAL_TABLET | Freq: Every day | ORAL | 3 refills | Status: DC

## 2021-12-12 ENCOUNTER — Telehealth: Payer: Self-pay | Admitting: Family Medicine

## 2021-12-12 NOTE — Telephone Encounter (Signed)
Pharmacy told the pt she had no more refills on her metoprolol. She needs it called in. ?

## 2021-12-12 NOTE — Telephone Encounter (Signed)
Attempted to contact pt and was unable to LVM ? ?Pt should have enough medications to last until July. Advise pt to have pharmacy look up med by name not number ?

## 2021-12-14 NOTE — Telephone Encounter (Signed)
Spoke with patient regarding results/recommendations.  

## 2021-12-24 ENCOUNTER — Ambulatory Visit: Payer: Medicare Other

## 2021-12-24 DIAGNOSIS — Z9181 History of falling: Secondary | ICD-10-CM | POA: Diagnosis not present

## 2021-12-24 DIAGNOSIS — M6281 Muscle weakness (generalized): Secondary | ICD-10-CM | POA: Diagnosis not present

## 2021-12-24 NOTE — Therapy (Signed)
[facility] [place], [place], [address] Phone: [phone]   Fax:  [phone]  Physical Therapy Treatment  Patient Details  Name: Cassie White MRN: [id number] Date of Birth: 1940/11/17 Referring Provider (PT): Alonza Bogus, DO   Encounter Date: 12/24/2021   PT End of Session - 12/24/21 1306     Visit Number 13    Number of Visits 14    Date for PT Re-Evaluation 12/27/21    PT Start Time 1301    PT Stop Time 1344    PT Time Calculation (min) 43 min    Activity Tolerance Patient tolerated treatment well    Behavior During Therapy [facility] for tasks assessed/performed             Past Medical History:  Diagnosis Date   Abnormal mammogram of left breast    Likely benign microcalcifications--repeat L diag mammo 03/24/2017.  COMPLEX SCLEROSING LESION WITH CALCIFICATIONS --no sign of malignancy.  Excision recommended--pt has been referred to Dr. Donne Hazel, who recommended f/u mammo and this was normal 12/2017--consider rpt 1 yr per rad.   Arthritis    DJD, back and both hips   Chronic low back pain without sciatica    Lumbar spondylosis + scoliosis.  Summer 2017 Dr. Nelva Bush did ESI and pt got no relief.  Pt then saw Dr. Maia Petties [facility]: felt she had facet mediated pain; plan for B L345 MBB (??).   Cystocele, midline    Diverticulosis    on colonoscopy 2004   GERD (gastroesophageal reflux disease)    Hx of cardiovascular stress test    Lexiscan Myoview (2/14):  Low risk, small ant defect likely breast attenuation, no ischemia, EF 69% (no change from 2010).     Hyperlipidemia    Hypertension    per pt   Idiopathic scoliosis of thoracolumbar region    Lumbar back pain    Mitral regurgitation    Echo (2/14):  EF 60-65%, Gr 1 DD, mild AI, mild bileaflet MVP, mod post directed MR, mild LAE, PASP 35.   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  Responding to gabapentin (Dr. Brett Fairy).   Osteoporosis, senile    stable left hip 2015 (took fosamax x 67yr, T-score -2.7 when she stopped bisphosphonate).  Rpt DEXA 229moater (09/2016) T-score -.3.1 (different machine, though). 11/2019 DEXA T-score -4. Recommended restart of fosamax   PAF (paroxysmal atrial fibrillation) (HCC)    Dr. JoMartiniqueFlecainide, metopr, apxiaban.  08/2017-->persistent a-fib w/RVR, flecainide increased and she converted back to NSR and felt better.  '100mg'$  bid flecainide continued.   Parkinson's disease ([facility] dx   started sinemet 05/2019 (Dr. TaCarles Collet  Peripheral neuropathy 2008   Idiopathic vs familial (motor>sensory) vs lumbar disc dz: gabapentin helpful   Shoulder pain left   RC surg 09/2014    Past Surgical History:  Procedure Laterality Date   BREAST BIOPSY  04/10/2017   Left breast core needle biopsy of calcifications.  COMPLEX SCLEROSING LESION WITH CALCIFICATIONS --no sign of malignancy.  Excision recommended--pt has been referred to Dr. WaDonne Hazelwho recommended rpt mammo and this was NORMAL 12/2017.   BREAST EXCISIONAL BIOPSY Right 2010   Benign   BREAST SURGERY  Nov 2010   Benign biopsy, right   CHOLECYSTECTOMY  03/2010   Dr.Gross   COLONOSCOPY  10/03/2002   No polyps.  Repeat 10 yrs recommended but pt declines.  Pt declined cologuard 08/2016 but changed her mind and this test was NEG on 03/23/17.   COMBINED HYSTERECTOMY VAGINAL / OOPHORECTOMY / A&P REPAIR  3299   uncertain if ovaries were removed or not   CYSTOCELE REPAIR     DEXA  11/2019   T score -4   Lumpectomy  06/2009   "Fatty Necrosis"   PFT's  2014   NORMAL   REVERSE SHOULDER ARTHROPLASTY Right 03/11/2019   Procedure: REVERSE TOTAL SHOULDER ARTHROPLASTY;  Surgeon: Netta Cedars, MD;  Location: [facility];  Service: Orthopedics;  Laterality: Right;  Intersclene block   ROTATOR CUFF REPAIR Left 10/02/14   Dr. Veverly Fells   TONSILLECTOMY     TOTAL KNEE ARTHROPLASTY  09/2009   Right  ;Dr Alvan Dame   TOTAL KNEE ARTHROPLASTY Left 03/10/2016   Procedure: TOTAL KNEE ARTHROPLASTY;  Surgeon: Paralee Cancel, MD;  Location: [facility];  Service: Orthopedics;  Laterality: Left;   TRANSTHORACIC ECHOCARDIOGRAM  09/2012; 09/27/15   2014: EF 60-65%, Gr 1 DD, mild AI, mild bileaflet MVP, mod post directed MR, mild LAE, PASP 35.  Repeat 2017: EF 55-60%, mild AR, mild MVP and mild MV regurg.   VAGINAL HYSTERECTOMY     For Uterine Deviation     There were no vitals filed for this visit.   Subjective Assessment - 12/24/21 1304     Subjective Patient reports that she feels good today.    Pertinent History Parkinson's Disease, low back pain, bilateral TKA's,    How long can you walk comfortably? 10-15 minutes prior to haing to rest    Patient Stated Goals increase lower extremity strength and reduce her risk of falling    Currently in Pain? No/denies                               [facility] Adult PT Treatment/Exercise - 12/24/21 0001       Knee/Hip Exercises: Aerobic   Nustep L5 x 15 minutes      Knee/Hip Exercises: Standing   Heel Raises Both   2 minutes   Hip Flexion Both;Knee bent   2 minutes; 5 pound ankle weight   Hip Extension Both;20 reps;Knee straight   cueing for upright stance     Knee/Hip Exercises: Seated   Long Arc Quad Strengthening;Both;Weights;2 sets    [employer] Weight 5 lbs.                 Balance Exercises - 12/24/21 0001       Balance Exercises: Standing   Standing Eyes Opened Wide (BOA);Head turns;Foam/compliant surface;Cognitive challenge;Time   5 minutes; dual tasking such as talking and answering questions   Rockerboard Anterior/posterior;EO;UE support   2 HHA progressing to 1 HHA; 4 minutes                    PT Long Term Goals - 11/11/21 1121       PT LONG TERM GOAL #1   Title Patient will be independent with her HEP.    Time 6    Period Weeks    Status On-going    Target Date 11/28/21      PT LONG TERM GOAL #2    Title Patient will improve her TUG time to 13 seconds or less to reduce her risk of falls.    Time 6    Period Weeks    Status On-going    Target Date 11/28/21  PT LONG TERM GOAL #3   Title Patient will improve her 5x sit to stand time to 14 seconds or less to reduce her fall risk and improve her lower extremity power.    Time 6    Period Weeks    Status On-going    Target Date 11/28/21      PT LONG TERM GOAL #4   Title Patient will report being able to stand at least 30 minutes for improved function completing her household activities.    Time 6    Period Weeks    Status On-going    Target Date 11/28/21                   Plan - 12/24/21 1321     Clinical Impression Statement Patient was progressed with familiar interventions for improved lower extremity strength and stability. She required minimal cueing with standing hip extensions for upright stance to promote proper posture. She reported feeling a little tired upon the conclusion of treatment. She continues to require skilled physical therapy to address her remaining impairments to maximize her safety and functional mobility.    Personal Factors and Comorbidities Time since onset of injury/illness/exacerbation;Comorbidity 3+;Profession    Comorbidities Parkinson's Disease (Dx September 2020) A Fib, neuropathy, osteoporosis.    Examination-Activity Limitations Transfers;Locomotion Level;Stand;Stairs    Examination-Participation Restrictions Meal Prep;Cleaning;Community Activity;Shop    Stability/Clinical Decision Making Evolving/Moderate complexity    Rehab Potential Good    PT Frequency 3x / week    PT Duration 6 weeks    PT Treatment/Interventions ADLs/Self Care Home Management;Balance training;Therapeutic exercise;Therapeutic activities;Functional mobility training;Stair training;Gait training;Neuromuscular re-education;Patient/family education    PT Next Visit Plan Nustep, lower extremity strengthening and  balance interventions    Consulted and Agree with Plan of Care Patient             Patient will benefit from skilled therapeutic intervention in order to improve the following deficits and impairments:  Abnormal gait, Difficulty walking, Decreased balance, Decreased activity tolerance, Decreased strength, Postural dysfunction, Pain, Decreased endurance, Decreased safety awareness  Visit Diagnosis: History of falling  Muscle weakness (generalized)     Problem List Patient Active Problem List   Diagnosis Date Noted   Aftercare 04/25/2019   S/P shoulder replacement, 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   Pain in joint of right shoulder 01/10/2019   Pain in both feet 12/14/2017   Neuropathy 12/14/2017   Benign essential tremor 12/14/2017   Paroxysmal atrial fibrillation (HCC) 12/14/2017   Chronic low back pain without sciatica 12/14/2017   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   Snoring 05/19/2017   S/P knee replacement 03/10/2016   Female bladder prolapse 12/06/2015   Paroxysmal atrial fibrillation with rapid ventricular response ([place]) 09/28/2015   Maxillary sinusitis, acute 08/27/2015   Mitral insufficiency 05/12/2014   HTN (hypertension) 04/28/2014   Lactic acidosis 09/07/2013   Mitral valve prolapse 06/14/2013   Chest pain, exertional 09/13/2012   Pernicious anemia 09/07/2012   DIVERTICULOSIS, COLON 09/06/2009   Osteoporosis 09/06/2009   Hyperlipidemia 09/04/2009   Peripheral sensory neuropathy 09/04/2009   LOW BACK PAIN, CHRONIC 04/08/2007    Darlin Coco, PT 12/24/2021, 1:59 PM  [facility] [address] [place], [place], [address] Phone: [phone]   Fax:  [phone]  Name: Cassie White MRN: [id number] Date of Birth: Mar 17, 1941

## 2021-12-27 ENCOUNTER — Ambulatory Visit: Payer: Medicare Other

## 2021-12-27 DIAGNOSIS — Z9181 History of falling: Secondary | ICD-10-CM

## 2021-12-27 DIAGNOSIS — M6281 Muscle weakness (generalized): Secondary | ICD-10-CM | POA: Diagnosis not present

## 2021-12-27 NOTE — Therapy (Addendum)
[facility], [place], [address] Phone: [phone]   Fax:  [phone]  Physical Therapy Treatment  Patient Details  Name: Cassie White MRN: [id number] Date of Birth: 1940-09-06 Referring Provider (PT): Alonza Bogus, DO   Encounter Date: 12/27/2021   PT End of Session - 12/27/21 1112     Visit Number 14    Number of Visits 14    Date for PT Re-Evaluation 12/27/21    PT Start Time 1102    PT Stop Time 1155    PT Time Calculation (min) 53 min    Activity Tolerance Patient tolerated treatment well    Behavior During Therapy [facility] for tasks assessed/performed             Past Medical History:  Diagnosis Date   Abnormal mammogram of left breast    Likely benign microcalcifications--repeat L diag mammo 03/24/2017.  COMPLEX SCLEROSING LESION WITH CALCIFICATIONS --no sign of malignancy.  Excision recommended--pt has been referred to Dr. Donne Hazel, who recommended f/u mammo and this was normal 12/2017--consider rpt 1 yr per rad.   Arthritis    DJD, back and both hips   Chronic low back pain without sciatica    Lumbar spondylosis + scoliosis.  Summer 2017 Dr. Nelva Bush did ESI and pt got no relief.  Pt then saw Dr. Maia Petties with [facility]: felt she had facet mediated pain; plan for B L345 MBB (??).   Cystocele, midline    Diverticulosis    on colonoscopy 2004   GERD (gastroesophageal reflux disease)    Hx of cardiovascular stress test    Lexiscan Myoview (2/14):  Low risk, small ant defect likely breast attenuation, no ischemia, EF 69% (no change from 2010).     Hyperlipidemia    Hypertension    per pt   Idiopathic scoliosis of thoracolumbar region    Lumbar back pain    Mitral regurgitation    Echo (2/14):  EF 60-65%, Gr 1 DD, mild AI, mild bileaflet MVP, mod post directed MR, mild LAE, PASP 35.   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  Responding to gabapentin (Dr. Brett Fairy).   Osteoporosis, senile    stable left hip 2015 (took fosamax x 60yr, T-score -2.7 when she stopped bisphosphonate).  Rpt DEXA 221moater (09/2016) T-score -.3.1 (different machine, though). 11/2019 DEXA T-score -4. Recommended restart of fosamax   PAF (paroxysmal atrial fibrillation) (HCC)    Dr. JoMartiniqueFlecainide, metopr, apxiaban.  08/2017-->persistent a-fib w/RVR, flecainide increased and she converted back to NSR and felt better.  10061mid flecainide continued.   Parkinson's disease ([facility] dx   started sinemet 05/2019 (Dr. TatCarles Collet Peripheral neuropathy 2008   Idiopathic vs familial (motor>sensory) vs lumbar disc dz: gabapentin helpful   Shoulder pain left   RC surg 09/2014    Past Surgical History:  Procedure Laterality Date   BREAST BIOPSY  04/10/2017   Left breast core needle biopsy of calcifications.  COMPLEX SCLEROSING LESION WITH CALCIFICATIONS --no sign of malignancy.  Excision recommended--pt has been referred to Dr. WakDonne Hazelho recommended rpt mammo and this was NORMAL 12/2017.   BREAST EXCISIONAL BIOPSY Right 2010   Benign   BREAST SURGERY  Nov 2010   Benign biopsy, right   CHOLECYSTECTOMY  03/2010   Dr.Gross   COLONOSCOPY  10/03/2002   No polyps.  Repeat 10 yrs recommended but pt declines.  Pt declined cologuard 08/2016 but changed her mind and this test was NEG on 03/23/17.   COMBINED HYSTERECTOMY VAGINAL / OOPHORECTOMY / A&P REPAIR  5035   uncertain if ovaries were removed or not   CYSTOCELE REPAIR     DEXA  11/2019   T score -4   Lumpectomy  06/2009   "Fatty Necrosis"   PFT's  2014   NORMAL   REVERSE SHOULDER ARTHROPLASTY Right 03/11/2019   Procedure: REVERSE TOTAL SHOULDER ARTHROPLASTY;  Surgeon: Netta Cedars, MD;  Location: [facility];  Service: Orthopedics;  Laterality: Right;  Intersclene block   ROTATOR CUFF REPAIR Left 10/02/14   Dr. Veverly Fells   TONSILLECTOMY     TOTAL KNEE ARTHROPLASTY  09/2009   Right  ;Dr Alvan Dame   TOTAL KNEE ARTHROPLASTY Left 03/10/2016   Procedure: TOTAL KNEE ARTHROPLASTY;  Surgeon: Paralee Cancel, MD;  Location: [facility];  Service: Orthopedics;  Laterality: Left;   TRANSTHORACIC ECHOCARDIOGRAM  09/2012; 09/27/15   2014: EF 60-65%, Gr 1 DD, mild AI, mild bileaflet MVP, mod post directed MR, mild LAE, PASP 35.  Repeat 2017: EF 55-60%, mild AR, mild MVP and mild MV regurg.   VAGINAL HYSTERECTOMY     For Uterine Deviation     There were no vitals filed for this visit.   Subjective Assessment - 12/27/21 1111     Subjective Patient reports that she feels good today, but her knee is hurting a little.    Pertinent History Parkinson's Disease, low back pain, bilateral TKA's,    How long can you walk comfortably? 10-15 minutes prior to haing to rest    Patient Stated Goals increase lower extremity strength and reduce her risk of falling    Currently in Pain? No/denies                [facility] PT Assessment - 12/27/21 0001       Transfers   Five time sit to stand comments  13 seconds   with UE support from armrest; 3 reps without armrest prior to having to stop due to fatigue     Timed Up and Go Test   Normal TUG (seconds) 26   without walker; 22 seconds with rolling walker                          [facility] Adult PT Treatment/Exercise - 12/27/21 0001       Knee/Hip Exercises: Aerobic   Nustep L5 x 15 minutes      Knee/Hip Exercises: Standing   Rocker Board 5 minutes    Other Standing Knee Exercises Side stepping   3 minutes; BUE support                    PT Education - 12/27/21 1231     Education Details progress with therapy, HEP, POC, benefits of exercise and walking    Person(s) Educated Patient    Methods Explanation    Comprehension Verbalized understanding                 PT Long Term Goals - 12/27/21 1116       PT LONG TERM GOAL #1   Title Patient will be independent with her HEP.    Baseline patient reports that she  is doing her HEP about once per day.    Time 6    Period Weeks    Status Partially Met    Target Date 11/28/21  PT LONG TERM GOAL #2   Title Patient will improve her TUG time to 13 seconds or less to reduce her risk of falls.    Time 6    Period Weeks    Status On-going    Target Date 11/28/21      PT LONG TERM GOAL #3   Title Patient will improve her 5x sit to stand time to 14 seconds or less to reduce her fall risk and improve her lower extremity power.    Time 6    Period Weeks    Status Partially Met    Target Date 11/28/21      PT LONG TERM GOAL #4   Title Patient will report being able to stand at least 30 minutes for improved function completing her household activities.    Baseline about 30 minutes    Time 6    Period Weeks    Status Achieved    Target Date 11/28/21                   Plan - 12/27/21 1149     Clinical Impression Statement Patient has made fair progress with skilled physical therapy as evidenced by her progress toward her goals. She was educated on her progress with therapy, the importance of her completing her HEP, and the benefits of continued exercise outside of therapy with her diagnosis. Treatment focused on familiar interventions. She required minimal cueing with side stepping for improved step height to prevent from dragging her feet. She reported feeling good upon the conclusion of treatment. She was recommended to continue with physical therapy for once a week for two additional weeks. However, she reported that she "wanted to think about it" prior to continuing with therapy.    Personal Factors and Comorbidities Time since onset of injury/illness/exacerbation;Comorbidity 3+;Profession    Comorbidities Parkinson's Disease (Dx September 2020) A Fib, neuropathy, osteoporosis.    Examination-Activity Limitations Transfers;Locomotion Level;Stand;Stairs    Examination-Participation Restrictions Meal Prep;Cleaning;Community Activity;Shop     Stability/Clinical Decision Making Evolving/Moderate complexity    Rehab Potential Good    PT Frequency 3x / week    PT Duration 6 weeks    PT Treatment/Interventions ADLs/Self Care Home Management;Balance training;Therapeutic exercise;Therapeutic activities;Functional mobility training;Stair training;Gait training;Neuromuscular re-education;Patient/family education    PT Next Visit Plan Nustep, lower extremity strengthening and balance interventions    Consulted and Agree with Plan of Care Patient             Patient will benefit from skilled therapeutic intervention in order to improve the following deficits and impairments:  Abnormal gait, Difficulty walking, Decreased balance, Decreased activity tolerance, Decreased strength, Postural dysfunction, Pain, Decreased endurance, Decreased safety awareness  Visit Diagnosis: History of falling  Muscle weakness (generalized)     Problem List Patient Active Problem List   Diagnosis Date Noted   Aftercare 04/25/2019   S/P shoulder replacement, 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   Pain in joint of right shoulder 01/10/2019   Pain in both feet 12/14/2017   Neuropathy 12/14/2017   Benign essential tremor 12/14/2017   Paroxysmal atrial fibrillation ([place]) 12/14/2017   Chronic low back pain without sciatica 12/14/2017   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   Snoring 05/19/2017   S/P knee replacement 03/10/2016   Female bladder prolapse 12/06/2015   Paroxysmal atrial fibrillation with rapid ventricular response ([place]) 09/28/2015   Maxillary sinusitis, acute 08/27/2015   Mitral insufficiency 05/12/2014   HTN (hypertension) 04/28/2014   Lactic acidosis 09/07/2013   Mitral valve  prolapse 06/14/2013   Chest pain, exertional 09/13/2012   Pernicious anemia 09/07/2012   DIVERTICULOSIS, COLON 09/06/2009   Osteoporosis 09/06/2009   Hyperlipidemia 09/04/2009   Peripheral sensory neuropathy 09/04/2009   LOW BACK PAIN, CHRONIC 04/08/2007    Darlin Coco, PT 12/27/2021, 12:33 PM  [facility] [address] [place], [place], [address] Phone: [phone]   Fax:  [phone]  Name: JAIMIE PIPPINS MRN: [id number] Date of Birth: 03-20-1941  Progress Note Reporting Period 10/17/21 to 12/27/21  See note below for Objective Data and Assessment of Progress/Goals.    See clinical impression statement  PHYSICAL THERAPY DISCHARGE SUMMARY  Visits from Start of Care: 12  Current functional level related to goals / functional outcomes: See clinical impression statement   Remaining deficits: Lower extremity, strength, power, and stability   Education / Equipment: HEP   Patient agrees to discharge. Patient goals were partially met. Patient is being discharged due to not returning since the last visit.

## 2022-02-25 ENCOUNTER — Telehealth: Payer: Self-pay | Admitting: Neurology

## 2022-02-25 DIAGNOSIS — G20 Parkinson's disease: Secondary | ICD-10-CM

## 2022-02-25 NOTE — Telephone Encounter (Signed)
Patients daughter called and stated Cassie White has fallen 2 times in the last few days and she is weak.  She had stopped physical therapy.  She wanted to see if they could get a new order for the therapy.  She also didn't know if Mayerly needed to be soon sooner.

## 2022-02-26 NOTE — Telephone Encounter (Signed)
Called and spoke to patients daughter and informed her of recommendations from Dr. Carles Collet. She will contact patients pcp about weakness and is aware we have sent the referral and placed patient on cancellation list. Patients daughter verbalized understanding and had no further questions and concerns.

## 2022-02-28 ENCOUNTER — Encounter: Payer: Self-pay | Admitting: Family Medicine

## 2022-02-28 ENCOUNTER — Ambulatory Visit (INDEPENDENT_AMBULATORY_CARE_PROVIDER_SITE_OTHER): Payer: Medicare Other | Admitting: Family Medicine

## 2022-02-28 VITALS — BP 126/71 | HR 46 | Temp 97.8°F | Ht 61.0 in | Wt 150.4 lb

## 2022-02-28 DIAGNOSIS — N3000 Acute cystitis without hematuria: Secondary | ICD-10-CM

## 2022-02-28 DIAGNOSIS — R351 Nocturia: Secondary | ICD-10-CM

## 2022-02-28 DIAGNOSIS — R296 Repeated falls: Secondary | ICD-10-CM

## 2022-02-28 DIAGNOSIS — R531 Weakness: Secondary | ICD-10-CM

## 2022-02-28 LAB — POCT URINALYSIS DIPSTICK
Bilirubin, UA: NEGATIVE
Blood, UA: NEGATIVE
Glucose, UA: NEGATIVE
Ketones, UA: NEGATIVE
Nitrite, UA: POSITIVE
Protein, UA: POSITIVE — AB
Spec Grav, UA: 1.015 (ref 1.010–1.025)
Urobilinogen, UA: 0.2 E.U./dL
pH, UA: 6.5 (ref 5.0–8.0)

## 2022-02-28 MED ORDER — CEFDINIR 300 MG PO CAPS: 300.0000 mg | ORAL_CAPSULE | Freq: Two times a day (BID) | ORAL | 0 refills | Status: DC

## 2022-02-28 NOTE — Progress Notes (Signed)
OFFICE VISIT  02/28/2022  CC:  Chief Complaint  Patient presents with   Fatigue    Recent falls(3) within the last week; legs feel like jello. Pt reports feeling more sleepy and daughter states she has not had a fall in a long time but this week things have changed   Patient is a 81 y.o. female who presents accompanied by her daughter Butch Penny for weakness. I last saw her 09/06/21. A/P as of that visit: "1 hypertension.  Well-controlled.  Continue Lopressor 25 mg twice a day.  2.  Hyperlipidemia.  Pravastatin 40 mg a day. LDL was 102 about 6 months ago. Fasting lipids and hepatic panel today.  3.  Osteoporosis.  October 2022 started raloxifene for her osteoporosis. Tolerating this well.  Plan DEXA in 1 year.   4. Preventative health care: Immunizations: She declines any further Tdap. Otherwise vaccines are UTD. Breast ca screening: mammogram due 07/2022. Colon cancer screening: Discussed today.  Will do Cologuard--she is aware that she would need a colonoscopy if this is positive.  If negative then no further colon cancer screening indicated.   5.  PAF--flecainide and Eliquis per cardiology. She will arrange follow-up with them soon."  INTERIM HX: About a week ago Karne began to feel much more weakness than her usual. She has fallen on 3 occasions: Once was in the middle the night when she got up to walk to the bathroom.  She got a bruise on her elbow but says she did not hit her head or any other part of her body.  Then on 2 occasions over the last few days her daughter has been next to her and Taylinn simply felt weakness in her knees and slumped down to the floor gently. She has had chronic nasal congestion, no recent changes. She has some urge incontinence and urinary frequency and mild nocturia but the nocturia has increased in the last few days.  She has never been good at hydrating because she is afraid of frequent urination and the frequent trips to the bathroom required. No recent  exposure to the extreme heat.  Has Parkinson's disease, followed by neurology.  History of falls without injury in the past but not frequently.  Is on carbidopa-levodopa, this has helped, no recent change in dose.  She has atrial fibrillation and is on Eliquis anticoagulation.  Had cardiology follow-up a couple of months ago it was felt that continuing Eliquis was still greater benefit/risk ratio.  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  ROS as above, plus--> no fevers, no CP, no SOB, no wheezing, no cough, no dizziness, no rashes, no melena/hematochezia.  No myalgias or arthralgias.  No focal weakness.  Chronic right hand tremor. no acute vision or hearing abnormalities.  No dysuria . No recent changes in lower legs. No n/v/d or abd pain.  No palpitations.    Past Medical History:  Diagnosis Date   Abnormal mammogram of left breast    Likely benign microcalcifications--repeat L diag mammo 03/24/2017.  COMPLEX SCLEROSING LESION WITH CALCIFICATIONS --no sign of malignancy.  Excision recommended--pt has been referred to Dr. Donne Hazel, who recommended f/u mammo and this was normal 12/2017--consider rpt 1 yr per rad.   Arthritis    DJD, back and both hips   Chronic low back pain without sciatica    Lumbar spondylosis + scoliosis.  Summer 2017 Dr. Nelva Bush did ESI and pt got no relief.  Pt then saw Dr. Maia Petties [facility]: felt she  had facet mediated pain; plan for B L345 MBB (??).   Cystocele, midline    Diverticulosis    on colonoscopy 2004   GERD (gastroesophageal reflux disease)    Hx of cardiovascular stress test    Lexiscan Myoview (2/14):  Low risk, small ant defect likely breast attenuation, no ischemia, EF 69% (no change from 2010).     Hyperlipidemia    Hypertension    per pt   Idiopathic scoliosis of thoracolumbar region    Lumbar back pain    Mitral regurgitation    Echo (2/14):  EF 60-65%, Gr 1 DD, mild AI, mild bileaflet MVP, mod  post directed MR, mild LAE, PASP 35.   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  Responding to gabapentin (Dr. Brett Fairy).   Osteoporosis, senile    stable left hip 2015 (took fosamax x 9yr, T-score -2.7 when she stopped bisphosphonate).  Rpt DEXA 250moater (09/2016) T-score -.3.1 (different machine, though). 11/2019 DEXA T-score -4. Recommended restart of fosamax   PAF (paroxysmal atrial fibrillation) (HCC)    Dr. JoMartiniqueFlecainide, metopr, apxiaban.  08/2017-->persistent a-fib w/RVR, flecainide increased and she converted back to NSR and felt better.  '100mg'$  bid flecainide continued.   [place]   started sinemet 05/2019 (Dr. TaCarles Collet  Peripheral neuropathy 2008   Idiopathic vs familial (motor>sensory) vs lumbar disc dz: gabapentin helpful   Shoulder pain left   RC surg 09/2014    Past Surgical History:  Procedure Laterality Date   BREAST BIOPSY  04/10/2017   Left breast core needle biopsy of calcifications.  COMPLEX SCLEROSING LESION WITH CALCIFICATIONS --no sign of malignancy.  Excision recommended--pt has been referred to Dr. WaDonne Hazelwho recommended rpt mammo and this was NORMAL 12/2017.   BREAST EXCISIONAL BIOPSY Right 2010   Benign   BREAST SURGERY  Nov 2010   Benign biopsy, right   CHOLECYSTECTOMY  03/2010   Dr.Gross   COLONOSCOPY  10/03/2002   No polyps.  Repeat 10 yrs recommended but pt declines.  Pt declined cologuard 08/2016 but changed her mind and this test was NEG on 03/23/17.   COMBINED HYSTERECTOMY VAGINAL / OOPHORECTOMY / A&P REPAIR  [id number] uncertain if ovaries were removed or not   CYSTOCELE REPAIR     DEXA  11/2019   T score -4   Lumpectomy  06/2009   "Fatty Necrosis"   PFT's  2014   NORMAL   REVERSE SHOULDER ARTHROPLASTY Right 03/11/2019   Procedure: REVERSE TOTAL SHOULDER ARTHROPLASTY;  Surgeon: NoNetta CedarsMD;  Location: [facility];  Service: Orthopedics;   Laterality: Right;  Intersclene block   ROTATOR CUFF REPAIR Left 10/02/14   Dr. NoVeverly Fells TONSILLECTOMY     TOTAL KNEE ARTHROPLASTY  09/2009   Right ;Dr OlAlvan Dame TOTAL KNEE ARTHROPLASTY Left 03/10/2016   Procedure: TOTAL KNEE ARTHROPLASTY;  Surgeon: MaParalee CancelMD;  Location: [facility];  Service: Orthopedics;  Laterality: Left;   TRANSTHORACIC ECHOCARDIOGRAM  09/2012; 09/27/15   2014: EF 60-65%, Gr 1 DD, mild AI, mild bileaflet MVP, mod post directed MR, mild LAE, PASP 35.  Repeat 2017: EF 55-60%, mild AR, mild MVP and mild MV regurg.   VAGINAL HYSTERECTOMY     For Uterine Deviation     Outpatient Medications Prior to Visit  Medication Sig Dispense Refill   apixaban (ELIQUIS) 5 MG TABS tablet Take 1 tablet (5 mg total)  by mouth 2 (two) times daily. 180 tablet 3   Biotin 5000 MCG CAPS Take 5,000 mcg by mouth daily.     Calcium Carbonate (CALCIUM 600 PO) Take 600 mg by mouth 2 (two) times daily.      carbidopa-levodopa (SINEMET IR) 25-100 MG tablet Take 2 tablets by mouth 3 (three) times daily. 540 tablet 1   cyanocobalamin (,VITAMIN B-12,) 1000 MCG/ML injection INJECT 1 ML (1,000 MCG TOTAL) INTO THE MUSCLE EVERY 30 DAYS. 3 mL 3   flecainide (TAMBOCOR) 100 MG tablet Take 1 tablet (100 mg total) by mouth 2 (two) times daily. Please keep upcoming appointment with Dr Martinique for any further refills. 180 tablet 3   gabapentin (NEURONTIN) 300 MG capsule 2 caps po bid 360 capsule 3   metoprolol tartrate (LOPRESSOR) 25 MG tablet Take 1 tablet (25 mg total) by mouth 2 (two) times daily. 180 tablet 3   nitroGLYCERIN (NITROSTAT) 0.4 MG SL tablet Place 1 tablet (0.4 mg total) under the tongue every 5 (five) minutes as needed for chest pain. 15 tablet 1   Omega-3 Fatty Acids (FISH OIL) 1200 MG CAPS Take 1,000 mg by mouth 2 (two) times daily.      pravastatin (PRAVACHOL) 40 MG tablet Take 1 tablet (40 mg total) by mouth daily. 90 tablet 3   raloxifene (EVISTA) 60 MG tablet Take 1 tablet (60 mg total) by mouth  daily. 90 tablet 3   traZODone (DESYREL) 50 MG tablet Take 1 tablet (50 mg total) by mouth at bedtime. 90 tablet 1   betamethasone dipropionate 0.05 % lotion SMARTSIG:1 sparingly Topical Daily     No facility-administered medications prior to visit.    Allergies  Allergen Reactions   Sulfa Antibiotics Swelling    face   Tizanidine Other (See Comments)    delirium    ROS As per HPI  PE:    02/28/2022   10:28 AM 12/10/2021   10:04 AM 10/10/2021   11:15 AM  Vitals with BMI  Height '5\' 1"'$  '5\' 1"'$  '5\' 1"'$   Weight 150 lbs 6 oz 151 lbs 139 lbs  BMI 28.43 09.32 67.12  Systolic 458 099 833  Diastolic 71 64 63  Pulse 46 55 65  02 sat 93% on RA today  Physical Exam  General: Appears somewhat tired but is alert and focuses/attends well.  Affect is pleasant.  Speech and thought are lucid. She sitting in wheelchair. ASN:KNLZ: no injection, icteris, swelling, or exudate.  EOMI, PERRLA. Mouth: lips without lesion/swelling.  Oral mucosa pink and moist. Oropharynx without erythema, exudate, or swelling.  CV: Regular rhythm, rate in the 40s to 76B, soft systolic murmur heard best at the left lower sternal border, no diastolic murmur.  No rub. Abdomen is soft and nontender and nondistended. Extremities show no clubbing cyanosis or edema.  LABS:  Last CBC Lab Results  Component Value Date   WBC 5.7 02/26/2021   HGB 13.4 02/26/2021   HCT 40.5 02/26/2021   MCV 93.7 02/26/2021   MCH 30.7 03/09/2019   RDW 13.5 02/26/2021   PLT 157.0 34/19/3790   Last metabolic panel Lab Results  Component Value Date   GLUCOSE 79 09/06/2021   NA 141 09/06/2021   K 4.3 09/06/2021   CL 105 09/06/2021   CO2 32 09/06/2021   BUN 19 09/06/2021   CREATININE 0.90 09/06/2021   GFRNONAA >60 03/12/2019   CALCIUM 9.9 09/06/2021   PROT 6.4 09/06/2021   ALBUMIN 4.1 09/06/2021   BILITOT 0.8 09/06/2021  ALKPHOS 62 09/06/2021   AST 18 09/06/2021   ALT 3 09/06/2021   ANIONGAP 6 03/12/2019   Last lipids Lab  Results  Component Value Date   CHOL 176 09/06/2021   HDL 68.20 09/06/2021   LDLCALC 86 09/06/2021   LDLDIRECT 87.5 09/07/2012   TRIG 110.0 09/06/2021   CHOLHDL 3 09/06/2021   Last hemoglobin A1c Lab Results  Component Value Date   HGBA1C  09/19/2010    5.3 (NOTE)                                                                       According to the ADA Clinical Practice Recommendations for 2011, when HbA1c is used as a screening test:   >=6.5%   Diagnostic of Diabetes Mellitus           (if abnormal result  is confirmed)  5.7-6.4%   Increased risk of developing Diabetes Mellitus  References:Diagnosis and Classification of Diabetes Mellitus,Diabetes MVHQ,4696,29(BMWUX 1):S62-S69 and Standards of Medical Care in         Diabetes - 2011,Diabetes Care,2011,34  (Suppl 1):S11-S61.   Last thyroid functions Lab Results  Component Value Date   TSH 1.32 04/21/2019   POC dipstick UA today: +nitrite and LEU, o/w normal  IMPRESSION AND PLAN:  #1 generalized weakness.  This has precipitated some falls without injury. She has some subtle urinary changes lately and abnormal UA today. UTI: start cefdinir 300 bid Urine C/s sent.  An After Visit Summary was printed and given to the patient.  FOLLOW UP: Return in about 5 days (around 03/05/2022) for f/u weakness, uti.  Signed:  Crissie Sickles, MD           02/28/2022

## 2022-03-02 LAB — URINE CULTURE
MICRO NUMBER:: 13710249
SPECIMEN QUALITY:: ADEQUATE

## 2022-03-04 ENCOUNTER — Encounter: Payer: Self-pay | Admitting: Family Medicine

## 2022-03-04 ENCOUNTER — Ambulatory Visit (INDEPENDENT_AMBULATORY_CARE_PROVIDER_SITE_OTHER): Payer: Medicare Other | Admitting: Family Medicine

## 2022-03-04 VITALS — BP 114/68 | HR 58 | Temp 97.6°F | Ht 61.0 in | Wt 147.6 lb

## 2022-03-04 DIAGNOSIS — R0982 Postnasal drip: Secondary | ICD-10-CM

## 2022-03-04 DIAGNOSIS — N3000 Acute cystitis without hematuria: Secondary | ICD-10-CM

## 2022-03-04 DIAGNOSIS — R531 Weakness: Secondary | ICD-10-CM | POA: Diagnosis not present

## 2022-03-04 DIAGNOSIS — R0981 Nasal congestion: Secondary | ICD-10-CM | POA: Diagnosis not present

## 2022-03-04 MED ORDER — FLUTICASONE PROPIONATE 50 MCG/ACT NA SUSP: 2.0000 | Freq: Every day | NASAL | 6 refills | Status: DC

## 2022-03-04 NOTE — Progress Notes (Signed)
OFFICE VISIT  03/04/2022  CC:  Chief Complaint  Patient presents with   Follow-up    Weakness, UTI   Patient is a 81 y.o. female who presents accompanied by her daughter Butch Penny for 4-day follow-up generalized weakness and UTI. A/P as of last visit: "#1 generalized weakness.  This has precipitated some falls without injury. She has some subtle urinary changes lately and abnormal UA today. UTI: start cefdinir 300 bid Urine C/s sent".  INTERIM HX: Urine culture showed Klebsiella, resistant to ampicillin but otherwise pansensitive.  Her mood is much better. She is at her pre-- illness level of energy, using her walker to get around her house, no falls.  She took a shower by herself yesterday and did not require any help.  No problems taking the cefdinir.  Has had a month or so of nasal congestion and postnasal drip.  Some head pressure. No cough or fever.  Past Medical History:  Diagnosis Date   Abnormal mammogram of left breast    Likely benign microcalcifications--repeat L diag mammo 03/24/2017.  COMPLEX SCLEROSING LESION WITH CALCIFICATIONS --no sign of malignancy.  Excision recommended--pt has been referred to Dr. Donne Hazel, who recommended f/u mammo and this was normal 12/2017--consider rpt 1 yr per rad.   Arthritis    DJD, back and both hips   Chronic low back pain without sciatica    Lumbar spondylosis + scoliosis.  Summer 2017 Dr. Nelva Bush did ESI and pt got no relief.  Pt then saw Dr. Maia Petties [facility]: felt she had facet mediated pain; plan for B L345 MBB (??).   Cystocele, midline    Diverticulosis    on colonoscopy 2004   GERD (gastroesophageal reflux disease)    Hx of cardiovascular stress test    Lexiscan Myoview (2/14):  Low risk, small ant defect likely breast attenuation, no ischemia, EF 69% (no change from 2010).     Hyperlipidemia    Hypertension    per pt   Idiopathic scoliosis of thoracolumbar region    Lumbar back pain    Mitral regurgitation     Echo (2/14):  EF 60-65%, Gr 1 DD, mild AI, mild bileaflet MVP, mod post directed MR, mild LAE, PASP 35.   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  Responding to gabapentin (Dr. Brett Fairy).   Osteoporosis, senile    stable left hip 2015 (took fosamax x 27yr, T-score -2.7 when she stopped bisphosphonate).  Rpt DEXA 271moater (09/2016) T-score -.3.1 (different machine, though). 11/2019 DEXA T-score -4. Recommended restart of fosamax   PAF (paroxysmal atrial fibrillation) (HCC)    Dr. JoMartiniqueFlecainide, metopr, apxiaban.  08/2017-->persistent a-fib w/RVR, flecainide increased and she converted back to NSR and felt better.  '100mg'$  bid flecainide continued.   [place]'s [place]   started sinemet 05/2019 (Dr. TaCarles Collet  Peripheral neuropathy 2008   Idiopathic vs familial (motor>sensory) vs lumbar disc dz: gabapentin helpful   Shoulder pain left   RC surg 09/2014    Past Surgical History:  Procedure Laterality Date   BREAST BIOPSY  04/10/2017   Left breast core needle biopsy of calcifications.  COMPLEX SCLEROSING LESION WITH CALCIFICATIONS --no sign of malignancy.  Excision recommended--pt has been referred to Dr. WaDonne Hazelwho recommended rpt mammo and this was NORMAL 12/2017.   BREAST EXCISIONAL BIOPSY Right 2010   Benign   BREAST SURGERY  Nov 2010   Benign biopsy, right  CHOLECYSTECTOMY  03/2010   Dr.Gross   COLONOSCOPY  10/03/2002   No polyps.  Repeat 10 yrs recommended but pt declines.  Pt declined cologuard 08/2016 but changed her mind and this test was NEG on 03/23/17.   COMBINED HYSTERECTOMY VAGINAL / OOPHORECTOMY / A&P REPAIR  3825   uncertain if ovaries were removed or not   CYSTOCELE REPAIR     DEXA  11/2019   T score -4   Lumpectomy  06/2009   "Fatty Necrosis"   PFT's  2014   NORMAL   REVERSE SHOULDER ARTHROPLASTY Right 03/11/2019   Procedure: REVERSE TOTAL SHOULDER ARTHROPLASTY;  Surgeon:  Netta Cedars, MD;  Location: [facility];  Service: Orthopedics;  Laterality: Right;  Intersclene block   ROTATOR CUFF REPAIR Left 10/02/14   Dr. Veverly Fells   TONSILLECTOMY     TOTAL KNEE ARTHROPLASTY  09/2009   Right ;Dr Alvan Dame   TOTAL KNEE ARTHROPLASTY Left 03/10/2016   Procedure: TOTAL KNEE ARTHROPLASTY;  Surgeon: Paralee Cancel, MD;  Location: [facility];  Service: Orthopedics;  Laterality: Left;   TRANSTHORACIC ECHOCARDIOGRAM  09/2012; 09/27/15   2014: EF 60-65%, Gr 1 DD, mild AI, mild bileaflet MVP, mod post directed MR, mild LAE, PASP 35.  Repeat 2017: EF 55-60%, mild AR, mild MVP and mild MV regurg.   VAGINAL HYSTERECTOMY     For Uterine Deviation     Outpatient Medications Prior to Visit  Medication Sig Dispense Refill   apixaban (ELIQUIS) 5 MG TABS tablet Take 1 tablet (5 mg total) by mouth 2 (two) times daily. 180 tablet 3   betamethasone dipropionate 0.05 % lotion SMARTSIG:1 sparingly Topical Daily     Biotin 5000 MCG CAPS Take 5,000 mcg by mouth daily.     Calcium Carbonate (CALCIUM 600 PO) Take 600 mg by mouth 2 (two) times daily.      carbidopa-levodopa (SINEMET IR) 25-100 MG tablet Take 2 tablets by mouth 3 (three) times daily. 540 tablet 1   cefdinir (OMNICEF) 300 MG capsule Take 1 capsule (300 mg total) by mouth 2 (two) times daily. 10 capsule 0   cyanocobalamin (,VITAMIN B-12,) 1000 MCG/ML injection INJECT 1 ML (1,000 MCG TOTAL) INTO THE MUSCLE EVERY 30 DAYS. 3 mL 3   flecainide (TAMBOCOR) 100 MG tablet Take 1 tablet (100 mg total) by mouth 2 (two) times daily. Please keep upcoming appointment with Dr Martinique for any further refills. 180 tablet 3   gabapentin (NEURONTIN) 300 MG capsule 2 caps po bid 360 capsule 3   metoprolol tartrate (LOPRESSOR) 25 MG tablet Take 1 tablet (25 mg total) by mouth 2 (two) times daily. 180 tablet 3   nitroGLYCERIN (NITROSTAT) 0.4 MG SL tablet Place 1 tablet (0.4 mg total) under the tongue every 5 (five) minutes as needed for chest pain. 15 tablet 1   Omega-3  Fatty Acids (FISH OIL) 1200 MG CAPS Take 1,000 mg by mouth 2 (two) times daily.      pravastatin (PRAVACHOL) 40 MG tablet Take 1 tablet (40 mg total) by mouth daily. 90 tablet 3   raloxifene (EVISTA) 60 MG tablet Take 1 tablet (60 mg total) by mouth daily. 90 tablet 3   traZODone (DESYREL) 50 MG tablet Take 1 tablet (50 mg total) by mouth at bedtime. 90 tablet 1   No facility-administered medications prior to visit.    Allergies  Allergen Reactions   Sulfa Antibiotics Swelling    face   Tizanidine Other (See Comments)    delirium    ROS  As per HPI  PE:    03/04/2022   10:48 AM 02/28/2022   10:28 AM 12/10/2021   10:04 AM  Vitals with BMI  Height '5\' 1"'$  '5\' 1"'$  '5\' 1"'$   Weight 147 lbs 10 oz 150 lbs 6 oz 151 lbs  BMI 27.9 81.85 63.14  Systolic 970 263 785  Diastolic 68 71 64  Pulse 58 46 55     Physical Exam  Gen: Alert, well appearing.  Patient is oriented to person, place, time, and situation. AFFECT: pleasant, lucid thought and speech. No further exam today.  LABS:  Last CBC Lab Results  Component Value Date   WBC 5.7 02/26/2021   HGB 13.4 02/26/2021   HCT 40.5 02/26/2021   MCV 93.7 02/26/2021   MCH 30.7 03/09/2019   RDW 13.5 02/26/2021   PLT 157.0 88/50/2774   Last metabolic panel Lab Results  Component Value Date   GLUCOSE 79 09/06/2021   NA 141 09/06/2021   K 4.3 09/06/2021   CL 105 09/06/2021   CO2 32 09/06/2021   BUN 19 09/06/2021   CREATININE 0.90 09/06/2021   GFRNONAA >60 03/12/2019   CALCIUM 9.9 09/06/2021   PROT 6.4 09/06/2021   ALBUMIN 4.1 09/06/2021   BILITOT 0.8 09/06/2021   ALKPHOS 62 09/06/2021   AST 18 09/06/2021   ALT 3 09/06/2021   ANIONGAP 6 03/12/2019   IMPRESSION AND PLAN:  #1 UTI.  This was causing atypical symptoms--for the most part generalized weakness and malaise.  Resolving appropriately on cefdinir--she is essentially at her preillness baseline.  #2 chronic nasal congestion.  Suspect allergic rhinitis but could also be mild  URI persisting. Start Flonase.  Avoiding antihistamine because I think these will make more tired.  An After Visit Summary was printed and given to the patient.  FOLLOW UP: No follow-ups on file.  Signed:  Crissie Sickles, MD           03/04/2022

## 2022-03-05 ENCOUNTER — Other Ambulatory Visit: Payer: Self-pay | Admitting: Family Medicine

## 2022-03-05 DIAGNOSIS — I4891 Unspecified atrial fibrillation: Secondary | ICD-10-CM

## 2022-03-06 ENCOUNTER — Other Ambulatory Visit: Payer: Self-pay | Admitting: Family Medicine

## 2022-03-06 ENCOUNTER — Other Ambulatory Visit: Payer: Self-pay | Admitting: Neurology

## 2022-03-06 ENCOUNTER — Ambulatory Visit: Payer: Medicare Other | Admitting: Family Medicine

## 2022-03-06 DIAGNOSIS — G609 Hereditary and idiopathic neuropathy, unspecified: Secondary | ICD-10-CM

## 2022-03-11 DIAGNOSIS — M79676 Pain in unspecified toe(s): Secondary | ICD-10-CM | POA: Diagnosis not present

## 2022-03-11 DIAGNOSIS — L84 Corns and callosities: Secondary | ICD-10-CM | POA: Diagnosis not present

## 2022-03-11 DIAGNOSIS — B351 Tinea unguium: Secondary | ICD-10-CM | POA: Diagnosis not present

## 2022-03-12 ENCOUNTER — Telehealth: Payer: Self-pay

## 2022-03-12 NOTE — Telephone Encounter (Signed)
Daughter called (not on DPR) states patient cannot take nose spray that was prescribed recently.  She is wanting to know if she can take OTC claritan or zyrtec. Please call patient [phone]

## 2022-03-12 NOTE — Telephone Encounter (Signed)
Over-the-counter Zyrtec or Claritin either 1 is fine.

## 2022-03-12 NOTE — Telephone Encounter (Signed)
LM for pt regarding recommendations.

## 2022-03-12 NOTE — Telephone Encounter (Signed)
Please advise if ok for Claritin or Zyrtec use.

## 2022-03-13 NOTE — Telephone Encounter (Signed)
Tried calling patient, unable to LVM.  

## 2022-03-14 ENCOUNTER — Ambulatory Visit: Payer: Medicare Other | Attending: Neurology

## 2022-03-14 DIAGNOSIS — M6281 Muscle weakness (generalized): Secondary | ICD-10-CM | POA: Insufficient documentation

## 2022-03-14 DIAGNOSIS — Z9181 History of falling: Secondary | ICD-10-CM | POA: Diagnosis not present

## 2022-03-14 DIAGNOSIS — G20 Parkinson's disease: Secondary | ICD-10-CM | POA: Diagnosis not present

## 2022-03-14 NOTE — Therapy (Signed)
OUTPATIENT PHYSICAL THERAPY NEURO EVALUATION   Patient Name: Cassie White MRN: [id number] DOB:Oct 16, 1940, 81 y.o., female Today's Date: 03/14/2022  REFERRING PROVIDER: Ludwig Clarks, DO   PT End of Session - 03/14/22 1149     Visit Number 1    Number of Visits 12    Date for PT Re-Evaluation 05/30/22    PT Start Time 1115    PT Stop Time 1203    PT Time Calculation (min) 48 min    Activity Tolerance Patient tolerated treatment well    Behavior During Therapy [facility] for tasks assessed/performed             Past Medical History:  Diagnosis Date   Abnormal mammogram of left breast    Likely benign microcalcifications--repeat L diag mammo 03/24/2017.  COMPLEX SCLEROSING LESION WITH CALCIFICATIONS --no sign of malignancy.  Excision recommended--pt has been referred to Dr. Donne Hazel, who recommended f/u mammo and this was normal 12/2017--consider rpt 1 yr per rad.   Arthritis    DJD, back and both hips   Chronic low back pain without sciatica    Lumbar spondylosis + scoliosis.  Summer 2017 Dr. Nelva Bush did ESI and pt got no relief.  Pt then saw Dr. Maia Petties [facility]: felt she had facet mediated pain; plan for B L345 MBB (??).   Cystocele, midline    Diverticulosis    on colonoscopy 2004   GERD (gastroesophageal reflux disease)    Hx of cardiovascular stress test    Lexiscan Myoview (2/14):  Low risk, small ant defect likely breast attenuation, no ischemia, EF 69% (no change from 2010).     Hyperlipidemia    Hypertension    per pt   Idiopathic scoliosis of thoracolumbar region    Lumbar back pain    Mitral regurgitation    Echo (2/14):  EF 60-65%, Gr 1 DD, mild AI, mild bileaflet MVP, mod post directed MR, mild LAE, PASP 35.   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  Responding to gabapentin (Dr. Brett Fairy).   Osteoporosis, senile    stable left hip 2015 (took fosamax x 5yr, T-score  -2.7 when she stopped bisphosphonate).  Rpt DEXA 220moater (09/2016) T-score -.3.1 (different machine, though). 11/2019 DEXA T-score -4. Recommended restart of fosamax   PAF (paroxysmal atrial fibrillation) (HCC)    Dr. JoMartiniqueFlecainide, metopr, apxiaban.  08/2017-->persistent a-fib w/RVR, flecainide increased and she converted back to NSR and felt better.  '100mg'$  bid flecainide continued.   Parkinson's disease ([facility]2020 dx   started sinemet 05/2019 (Dr. TaCarles Collet  Peripheral neuropathy 2008   Idiopathic vs familial (motor>sensory) vs lumbar disc dz: gabapentin helpful   Shoulder pain left   RC surg 09/2014   Past Surgical History:  Procedure Laterality Date   BREAST BIOPSY  04/10/2017   Left breast core needle biopsy of calcifications.  COMPLEX SCLEROSING LESION WITH CALCIFICATIONS --no sign of malignancy.  Excision recommended--pt has been referred to Dr. WaDonne Hazelwho recommended rpt mammo and this was NORMAL 12/2017.   BREAST EXCISIONAL BIOPSY Right 2010   Benign   BREAST SURGERY  Nov 2010   Benign biopsy, right   CHOLECYSTECTOMY  03/2010   Dr.Gross   COLONOSCOPY  10/03/2002   No polyps.  Repeat 10 yrs recommended but pt declines.  Pt declined cologuard 08/2016 but changed her mind and this test was NEG on 03/23/17.   COMBINED HYSTERECTOMY VAGINAL /  OOPHORECTOMY / A&P REPAIR  0160   uncertain if ovaries were removed or not   CYSTOCELE REPAIR     DEXA  11/2019   T score -4   Lumpectomy  06/2009   "Fatty Necrosis"   PFT's  2014   NORMAL   REVERSE SHOULDER ARTHROPLASTY Right 03/11/2019   Procedure: REVERSE TOTAL SHOULDER ARTHROPLASTY;  Surgeon: Netta Cedars, MD;  Location: [facility];  Service: Orthopedics;  Laterality: Right;  Intersclene block   ROTATOR CUFF REPAIR Left 10/02/14   Dr. Veverly Fells   TONSILLECTOMY     TOTAL KNEE ARTHROPLASTY  09/2009   Right ;Dr Alvan Dame   TOTAL KNEE ARTHROPLASTY Left 03/10/2016   Procedure: TOTAL KNEE ARTHROPLASTY;  Surgeon: Paralee Cancel, MD;  Location: [facility];   Service: Orthopedics;  Laterality: Left;   TRANSTHORACIC ECHOCARDIOGRAM  09/2012; 09/27/15   2014: EF 60-65%, Gr 1 DD, mild AI, mild bileaflet MVP, mod post directed MR, mild LAE, PASP 35.  Repeat 2017: EF 55-60%, mild AR, mild MVP and mild MV regurg.   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   Pain in joint of right shoulder 01/10/2019   Pain in both feet 12/14/2017   Neuropathy 12/14/2017   Benign essential tremor 12/14/2017   Paroxysmal atrial fibrillation ([place]) 12/14/2017   Chronic low back pain without sciatica 12/14/2017   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   Snoring 05/19/2017   S/P knee replacement 03/10/2016   Female bladder prolapse 12/06/2015   Paroxysmal atrial fibrillation with rapid ventricular response ([place]) 09/28/2015   Maxillary sinusitis, acute 08/27/2015   Mitral insufficiency 05/12/2014   HTN (hypertension) 04/28/2014   Lactic acidosis 09/07/2013   Mitral valve prolapse 06/14/2013   Chest pain, exertional 09/13/2012   Pernicious anemia 09/07/2012   DIVERTICULOSIS, COLON 09/06/2009   Osteoporosis 09/06/2009   Hyperlipidemia 09/04/2009   Peripheral sensory neuropathy 09/04/2009   LOW BACK PAIN, CHRONIC 04/08/2007    ONSET DATE: September 2022  REFERRING DIAG: Parkinson's disease   THERAPY DIAG:  History of falling  Muscle weakness (generalized)  Rationale for Evaluation and Treatment Rehabilitation  SUBJECTIVE:                                                                                                                                                                                              SUBJECTIVE STATEMENT: Patient reports that she had physical therapy from March to May 2023. She was given HEP and she has been doing those exercises  about a couple times per week. She feels that they were helping. She notes  that she fell three times in one week since she finished therapy. She thinks these falls were about 2-3 weeks ago. She feels that each of these falls were due to her "feet getting tangled up."  Pt accompanied by: self  PERTINENT HISTORY: Parkinson's Disease, low back pain, bilateral TKA's, and multiple falls  PAIN:  Are you having pain? No  PRECAUTIONS: Fall  WEIGHT BEARING RESTRICTIONS No  FALLS: Has patient fallen in last 6 months? Yes. Number of falls 3 about 2-3 weeks ago  LIVING ENVIRONMENT: Lives with: lives with their family Lives in: House/apartment Stairs:  she avoids going up or down stairs Has following equipment at home: Environmental consultant - 4 wheeled and Ramped entry  PLOF: [place] be safer and not falling  OBJECTIVE:   COGNITION: Overall cognitive status: Within functional limits for tasks assessed   SENSATION: WFL  COORDINATION: WFL for activities assessed  MUSCLE TONE: WFL   DTRs:  Patella 1 = Trace  and Achilles 1 = Trace   POSTURE: rounded shoulders, forward head, and flexed trunk   UPPER EXTREMITY ROM:  Active ROM Right eval Left eval  Shoulder flexion 150 150  Shoulder extension    Shoulder abduction    Shoulder adduction 130 160  Shoulder extension    Shoulder internal rotation    Shoulder external rotation    Elbow flexion WFL WFL  Elbow extension [employer] WFL   Wrist flexion    Wrist extension    Wrist ulnar deviation    Wrist radial deviation    Wrist pronation    Wrist supination     (Blank rows = not tested)   UPPER EXTREMITY MMT:  MMT Right eval Left eval  Shoulder flexion 4-/5 4-/5  Shoulder extension    Shoulder abduction 4-/5 4-/5  Shoulder adduction    Shoulder extension    Shoulder internal rotation    Shoulder external rotation    Middle trapezius    Lower trapezius    Elbow flexion 4/5 4/5  Elbow extension 4-/5 4-/5  Wrist flexion    Wrist extension    Wrist ulnar deviation    Wrist radial deviation     Wrist pronation    Wrist supination    Grip strength 17 12   (Blank rows = not tested)   LOWER EXTREMITY ROM: WFL for activities assessed    LOWER EXTREMITY MMT:    MMT Right Eval Left Eval  Hip flexion 4-/5 4/5  Hip extension    Hip abduction    Hip adduction    Hip internal rotation    Hip external rotation    Knee flexion 4+/5 4+/5  Knee extension 4+/5 4+/5  Ankle dorsiflexion 4-/5 4-/5  Ankle plantarflexion    Ankle inversion    Ankle eversion    (Blank rows = not tested)  TRANSFERS: Assistive device utilized: None  Sit to stand: SBA Stand to sit: SBA  GAIT: Gait pattern: shuffling Assistive device utilized: Environmental consultant - 4 wheeled Level of assistance: Modified independence   FUNCTIONAL TESTs:  5 times sit to stand: 19.24 seconds without UE support Timed up and go (TUG): 21.5 seconds with rolling walker  TODAY'S TREATMENT:                                    8/11 EXERCISE LOG  Exercise Repetitions and Resistance Comments  Nustep  L3 x 15 minutes                    Blank cell = exercise not performed today    PATIENT EDUCATION: Education details: fall risk, plan of care, prognosis Person educated: Patient Education method: Explanation Education comprehension: verbalized understanding   HOME EXERCISE PROGRAM: Reviewed previous HEP     GOALS: Goals reviewed with patient? Yes  SHORT TERM GOALS: Target date: 04/04/2022  Patient will be independent with her initial HEP.  Baseline: Goal status: INITIAL  2.  Patient will improve her five time sit to stand to 15 seconds or less.  Baseline:  Goal status: INITIAL  3.  Patient will improve her TUG time to 15 seconds or less.  Baseline:  Goal status: INITIAL  LONG TERM GOALS: Target date: 04/25/2022  Patient will be independent with her advanced HEP.  Baseline:  Goal status: INITIAL  2.  Patient will improve her five time sit to stand time to 12 seconds or less.  Baseline:  Goal status:  INITIAL  3.  Patient will improve her TUG time to 12 seconds or less.  Baseline:  Goal status: INITIAL  ASSESSMENT:  CLINICAL IMPRESSION: Patient is a 81 y.o. female who was seen today for physical therapy evaluation and treatment for Parkinson's Disease and multiple falls. She is a high fall risk as evidenced by her decreased TUG and five time sit to stand times in addition to her multiple falls in recent weeks. Recommend that she continue with skilled physical therapy to address her remaining impairments to maximize her safety and functional mobility.     OBJECTIVE IMPAIRMENTS Abnormal gait, decreased balance, decreased mobility, difficulty walking, decreased strength, and postural dysfunction.   ACTIVITY LIMITATIONS standing, stairs, transfers, and locomotion level  PARTICIPATION LIMITATIONS: cleaning, shopping, and community activity  PERSONAL FACTORS Time since onset of injury/illness/exacerbation, Transportation, and 3+ comorbidities: Parkinson's Disease, HTN, and osteoporosis  are also affecting patient's functional outcome.   REHAB POTENTIAL: Good  CLINICAL DECISION MAKING: Evolving/moderate complexity  EVALUATION COMPLEXITY: Moderate  PLAN: PT FREQUENCY: 2x/week  PT DURATION: 6 weeks  PLANNED INTERVENTIONS: Therapeutic exercises, Therapeutic activity, Neuromuscular re-education, Balance training, Gait training, Patient/Family education, Self Care, Stair training, and Re-evaluation  PLAN FOR NEXT SESSION: nustep, upper and lower extremity strengthening, BIG movements, and balance interventions   Darlin Coco, PT 03/14/2022, 12:33 PM

## 2022-03-14 NOTE — Telephone Encounter (Signed)
Pt was advised of message.

## 2022-03-18 ENCOUNTER — Ambulatory Visit: Payer: Medicare Other | Admitting: *Deleted

## 2022-03-18 ENCOUNTER — Encounter: Payer: Self-pay | Admitting: *Deleted

## 2022-03-18 DIAGNOSIS — Z9181 History of falling: Secondary | ICD-10-CM | POA: Diagnosis not present

## 2022-03-18 DIAGNOSIS — M6281 Muscle weakness (generalized): Secondary | ICD-10-CM

## 2022-03-18 DIAGNOSIS — G20 Parkinson's disease: Secondary | ICD-10-CM | POA: Diagnosis not present

## 2022-03-18 NOTE — Therapy (Signed)
OUTPATIENT PHYSICAL THERAPY NEURO TREATMENT   Patient Name: Cassie White MRN: [id number] DOB:[birth year], 81 y.o., female Today's Date: 03/18/2022  REFERRING PROVIDER: Ludwig Clarks, DO   PT End of Session - 03/18/22 1307     Visit Number 2    Number of Visits 12    Date for PT Re-Evaluation 05/30/22    PT Start Time 1300    PT Stop Time 5053    PT Time Calculation (min) 49 min             Past Medical History:  Diagnosis Date   Abnormal mammogram of left breast    Likely benign microcalcifications--repeat L diag mammo 03/24/2017.  COMPLEX SCLEROSING LESION WITH CALCIFICATIONS --no sign of malignancy.  Excision recommended--pt has been referred to Dr. Donne Hazel, who recommended f/u mammo and this was normal 12/2017--consider rpt 1 yr per rad.   Arthritis    DJD, back and both hips   Chronic low back pain without sciatica    Lumbar spondylosis + scoliosis.  Summer 2017 Dr. Nelva Bush did ESI and pt got no relief.  Pt then saw Dr. Maia Petties [facility]: felt she had facet mediated pain; plan for B L345 MBB (??).   Cystocele, midline    Diverticulosis    on colonoscopy 2004   GERD (gastroesophageal reflux disease)    Hx of cardiovascular stress test    Lexiscan Myoview (2/14):  Low risk, small ant defect likely breast attenuation, no ischemia, EF 69% (no change from 2010).     Hyperlipidemia    Hypertension    per pt   Idiopathic scoliosis of thoracolumbar region    Lumbar back pain    Mitral regurgitation    Echo (2/14):  EF 60-65%, Gr 1 DD, mild AI, mild bileaflet MVP, mod post directed MR, mild LAE, PASP 35.   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  Responding to gabapentin (Dr. Brett Fairy).   Osteoporosis, senile    stable left hip 2015 (took fosamax x 87yr, T-score -2.7 when she stopped bisphosphonate).  Rpt DEXA 272moater (09/2016) T-score -.3.1 (different machine, though).  11/2019 DEXA T-score -4. Recommended restart of fosamax   PAF (paroxysmal atrial fibrillation) (HCC)    Dr. JoMartiniqueFlecainide, metopr, apxiaban.  08/2017-->persistent a-fib w/RVR, flecainide increased and she converted back to NSR and felt better.  '100mg'$  bid flecainide continued.   Parkinson's disease ([facility]2020 dx   started sinemet 05/2019 (Dr. TaCarles Collet  Peripheral neuropathy 2008   Idiopathic vs familial (motor>sensory) vs lumbar disc dz: gabapentin helpful   Shoulder pain left   RC surg 09/2014   Past Surgical History:  Procedure Laterality Date   BREAST BIOPSY  04/10/2017   Left breast core needle biopsy of calcifications.  COMPLEX SCLEROSING LESION WITH CALCIFICATIONS --no sign of malignancy.  Excision recommended--pt has been referred to Dr. WaDonne Hazelwho recommended rpt mammo and this was NORMAL 12/2017.   BREAST EXCISIONAL BIOPSY Right 2010   Benign   BREAST SURGERY  Nov 2010   Benign biopsy, right   CHOLECYSTECTOMY  03/2010   Dr.Gross   COLONOSCOPY  10/03/2002   No polyps.  Repeat 10 yrs recommended but pt declines.  Pt declined cologuard 08/2016 but changed her mind and this test was NEG on 03/23/17.   COMBINED HYSTERECTOMY VAGINAL / OOPHORECTOMY / A&P REPAIR  199767 uncertain if ovaries were removed or not   CYSTOCELE REPAIR  DEXA  11/2019   T score -4   Lumpectomy  06/2009   "Fatty Necrosis"   PFT's  2014   NORMAL   REVERSE SHOULDER ARTHROPLASTY Right 03/11/2019   Procedure: REVERSE TOTAL SHOULDER ARTHROPLASTY;  Surgeon: Netta Cedars, MD;  Location: [facility];  Service: Orthopedics;  Laterality: Right;  Intersclene block   ROTATOR CUFF REPAIR Left 10/02/14   Dr. Veverly Fells   TONSILLECTOMY     TOTAL KNEE ARTHROPLASTY  09/2009   Right ;Dr Alvan Dame   TOTAL KNEE ARTHROPLASTY Left 03/10/2016   Procedure: TOTAL KNEE ARTHROPLASTY;  Surgeon: Paralee Cancel, MD;  Location: [facility];  Service: Orthopedics;  Laterality: Left;   TRANSTHORACIC ECHOCARDIOGRAM  09/2012; 09/27/15   2014: EF 60-65%, Gr 1  DD, mild AI, mild bileaflet MVP, mod post directed MR, mild LAE, PASP 35.  Repeat 2017: EF 55-60%, mild AR, mild MVP and mild MV regurg.   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   Pain in joint of right shoulder 01/10/2019   Pain in both feet 12/14/2017   Neuropathy 12/14/2017   Benign essential tremor 12/14/2017   Paroxysmal atrial fibrillation ([place]) 12/14/2017   Chronic low back pain without sciatica 12/14/2017   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   Snoring 05/19/2017   S/P knee replacement 03/10/2016   Female bladder prolapse 12/06/2015   Paroxysmal atrial fibrillation with rapid ventricular response ([place]) 09/28/2015   Maxillary sinusitis, acute 08/27/2015   Mitral insufficiency 05/12/2014   HTN (hypertension) 04/28/2014   Lactic acidosis 09/07/2013   Mitral valve prolapse 06/14/2013   Chest pain, exertional 09/13/2012   Pernicious anemia 09/07/2012   DIVERTICULOSIS, COLON 09/06/2009   Osteoporosis 09/06/2009   Hyperlipidemia 09/04/2009   Peripheral sensory neuropathy 09/04/2009   LOW BACK PAIN, CHRONIC 04/08/2007    ONSET DATE: September 2022  REFERRING DIAG: Parkinson's disease   THERAPY DIAG:  History of falling  Muscle weakness (generalized)  Rationale for Evaluation and Treatment Rehabilitation  SUBJECTIVE:                                                                                                                                                                                              SUBJECTIVE STATEMENT: Patient reports doing ok after eval   PERTINENT HISTORY: Parkinson's Disease, low back pain, bilateral TKA's, and multiple falls  PAIN:  Are you having pain? No  PRECAUTIONS: Fall  WEIGHT BEARING RESTRICTIONS No  FALLS: Has patient fallen in last 6 months? Yes.  Number of falls 3 about 2-3 weeks  ago  LIVING ENVIRONMENT: Lives with: lives with their family Lives in: House/apartment Stairs:  she avoids going up or down stairs Has following equipment at home: Environmental consultant - 4 wheeled and Ramped entry  PLOF: [place] be safer and not falling    TRANSFERS: Assistive device utilized: None  Sit to stand: SBA Stand to sit: SBA  GAIT: Gait pattern: shuffling Assistive device utilized: Environmental consultant - 4 wheeled Level of assistance: Modified independence   FUNCTIONAL TESTs:  5 times sit to stand: 19.24 seconds without UE support Timed up and go (TUG): 21.5 seconds with rolling walker  TODAY'S TREATMENT:                                    8/15 EXERCISE LOG  Exercise Repetitions and Resistance Comments  Nustep  L3 x 15 minutes   Heel ups/toe ups 2x10 each   Marching  3x10   Standing ABD 2x10 each LE   Rocker board X3 mins   6in box toe taps  2x10 each LE   Sitting  ball adduction 2x10 hol 5 secs   Sitting clam shell Green tband 2x10 hold 5 secs   Sitting glute sets X 5 hold 5 secs   Sit to stand with glute activation X 5 hold 5secs    Blank cell = exercise not performed today    PATIENT EDUCATION: Education details: fall risk, plan of care, prognosis Person educated: Patient Education method: Explanation Education comprehension: verbalized understanding   HOME EXERCISE PROGRAM: Reviewed previous HEP     GOALS: Goals reviewed with patient? Yes  SHORT TERM GOALS: Target date: 04/04/2022  Patient will be independent with her initial HEP.  Baseline: Goal status: INITIAL  2.  Patient will improve her five time sit to stand to 15 seconds or less.  Baseline:  Goal status: INITIAL  3.  Patient will improve her TUG time to 15 seconds or less.  Baseline:  Goal status: INITIAL  LONG TERM GOALS: Target date: 04/25/2022  Patient will be independent with her advanced HEP.  Baseline:  Goal status: INITIAL  2.  Patient will improve her five time sit to  stand time to 12 seconds or less.  Baseline:  Goal status: INITIAL  3.  Patient will improve her TUG time to 12 seconds or less.  Baseline:  Goal status: INITIAL  ASSESSMENT:  CLINICAL IMPRESSION: Pt arrived today doing fairly well. Rx focused  on LE strengthening and balance act.'s. Sitting exs focused on mm isometrics and activation. Glute isometrics performed for activation and then sit to stand with focus on glutes. Pt did well with Rx today. SBA/CGA with all balance act.'s    OBJECTIVE IMPAIRMENTS Abnormal gait, decreased balance, decreased mobility, difficulty walking, decreased strength, and postural dysfunction.   ACTIVITY LIMITATIONS standing, stairs, transfers, and locomotion level  PARTICIPATION LIMITATIONS: cleaning, shopping, and community activity  PERSONAL FACTORS Time since onset of injury/illness/exacerbation, Transportation, and 3+ comorbidities: Parkinson's Disease, HTN, and osteoporosis  are also affecting patient's functional outcome.   REHAB POTENTIAL: Good  CLINICAL DECISION MAKING: Evolving/moderate complexity  EVALUATION COMPLEXITY: Moderate  PLAN: PT FREQUENCY: 2x/week  PT DURATION: 6 weeks  PLANNED INTERVENTIONS: Therapeutic exercises, Therapeutic activity, Neuromuscular re-education, Balance training, Gait training, Patient/Family education, Self Care, Stair training, and Re-evaluation  PLAN FOR NEXT SESSION: nustep, upper and lower extremity strengthening, BIG movements, and balance  interventions   Revin Corker,CHRIS, PTA 03/18/2022, 2:35 PM

## 2022-03-20 ENCOUNTER — Other Ambulatory Visit: Payer: Self-pay | Admitting: Family Medicine

## 2022-03-20 ENCOUNTER — Ambulatory Visit: Payer: Medicare Other | Admitting: Physical Therapy

## 2022-03-20 ENCOUNTER — Encounter: Payer: Self-pay | Admitting: Physical Therapy

## 2022-03-20 DIAGNOSIS — M6281 Muscle weakness (generalized): Secondary | ICD-10-CM

## 2022-03-20 DIAGNOSIS — G20 Parkinson's disease: Secondary | ICD-10-CM | POA: Diagnosis not present

## 2022-03-20 DIAGNOSIS — Z9181 History of falling: Secondary | ICD-10-CM

## 2022-03-20 NOTE — Therapy (Signed)
OUTPATIENT PHYSICAL THERAPY NEURO TREATMENT   Patient Name: Cassie White MRN: [id number] DOB:16-Sep-1940, 81 y.o., female Today's Date: 03/20/2022  REFERRING PROVIDER: Ludwig Clarks, DO   PT End of Session - 03/20/22 1303     Visit Number 3    Number of Visits 12    Date for PT Re-Evaluation 05/30/22    PT Start Time 1302    Activity Tolerance Patient tolerated treatment well    Behavior During Therapy [facility] for tasks assessed/performed             Past Medical History:  Diagnosis Date   Abnormal mammogram of left breast    Likely benign microcalcifications--repeat L diag mammo 03/24/2017.  COMPLEX SCLEROSING LESION WITH CALCIFICATIONS --no sign of malignancy.  Excision recommended--pt has been referred to Dr. Donne Hazel, who recommended f/u mammo and this was normal 12/2017--consider rpt 1 yr per rad.   Arthritis    DJD, back and both hips   Chronic low back pain without sciatica    Lumbar spondylosis + scoliosis.  Summer 2017 Dr. Nelva Bush did ESI and pt got no relief.  Pt then saw Dr. Maia Petties [facility]: felt she had facet mediated pain; plan for B L345 MBB (??).   Cystocele, midline    Diverticulosis    on colonoscopy 2004   GERD (gastroesophageal reflux disease)    Hx of cardiovascular stress test    Lexiscan Myoview (2/14):  Low risk, small ant defect likely breast attenuation, no ischemia, EF 69% (no change from 2010).     Hyperlipidemia    Hypertension    per pt   Idiopathic scoliosis of thoracolumbar region    Lumbar back pain    Mitral regurgitation    Echo (2/14):  EF 60-65%, Gr 1 DD, mild AI, mild bileaflet MVP, mod post directed MR, mild LAE, PASP 35.   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  Responding to gabapentin (Dr. Brett Fairy).   Osteoporosis, senile    stable left hip 2015 (took fosamax x 38yr, T-score -2.7 when she stopped bisphosphonate).  Rpt DEXA 218moater  (09/2016) T-score -.3.1 (different machine, though). 11/2019 DEXA T-score -4. Recommended restart of fosamax   PAF (paroxysmal atrial fibrillation) (HCC)    Dr. JoMartiniqueFlecainide, metopr, apxiaban.  08/2017-->persistent a-fib w/RVR, flecainide increased and she converted back to NSR and felt better.  '100mg'$  bid flecainide continued.   [place]   started sinemet 05/2019 (Dr. TaCarles Collet  Peripheral neuropathy 2008   Idiopathic vs familial (motor>sensory) vs lumbar disc dz: gabapentin helpful   Shoulder pain left   RC surg 09/2014   Past Surgical History:  Procedure Laterality Date   BREAST BIOPSY  04/10/2017   Left breast core needle biopsy of calcifications.  COMPLEX SCLEROSING LESION WITH CALCIFICATIONS --no sign of malignancy.  Excision recommended--pt has been referred to Dr. WaDonne Hazelwho recommended rpt mammo and this was NORMAL 12/2017.   BREAST EXCISIONAL BIOPSY Right 2010   Benign   BREAST SURGERY  Nov 2010   Benign biopsy, right   CHOLECYSTECTOMY  03/2010   Dr.Gross   COLONOSCOPY  10/03/2002   No polyps.  Repeat 10 yrs recommended but pt declines.  Pt declined cologuard 08/2016 but changed her mind and this test was NEG on 03/23/17.   COMBINED HYSTERECTOMY VAGINAL / OOPHORECTOMY / A&P REPAIR  [id number] uncertain if ovaries were removed or not  CYSTOCELE REPAIR     DEXA  11/2019   T score -4   Lumpectomy  06/2009   "Fatty Necrosis"   PFT's  2014   NORMAL   REVERSE SHOULDER ARTHROPLASTY Right 03/11/2019   Procedure: REVERSE TOTAL SHOULDER ARTHROPLASTY;  Surgeon: Netta Cedars, MD;  Location: [facility];  Service: Orthopedics;  Laterality: Right;  Intersclene block   ROTATOR CUFF REPAIR Left 10/02/14   Dr. Veverly Fells   TONSILLECTOMY     TOTAL KNEE ARTHROPLASTY  09/2009   Right ;Dr Alvan Dame   TOTAL KNEE ARTHROPLASTY Left 03/10/2016   Procedure: TOTAL KNEE ARTHROPLASTY;  Surgeon: Paralee Cancel, MD;  Location: [facility];  Service: Orthopedics;  Laterality: Left;   TRANSTHORACIC  ECHOCARDIOGRAM  09/2012; 09/27/15   2014: EF 60-65%, Gr 1 DD, mild AI, mild bileaflet MVP, mod post directed MR, mild LAE, PASP 35.  Repeat 2017: EF 55-60%, mild AR, mild MVP and mild MV regurg.   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   Pain in joint of right shoulder 01/10/2019   Pain in both feet 12/14/2017   Neuropathy 12/14/2017   Benign essential tremor 12/14/2017   Paroxysmal atrial fibrillation ([place]) 12/14/2017   Chronic low back pain without sciatica 12/14/2017   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   Snoring 05/19/2017   S/P knee replacement 03/10/2016   Female bladder prolapse 12/06/2015   Paroxysmal atrial fibrillation with rapid ventricular response ([place]) 09/28/2015   Maxillary sinusitis, acute 08/27/2015   Mitral insufficiency 05/12/2014   HTN (hypertension) 04/28/2014   Lactic acidosis 09/07/2013   Mitral valve prolapse 06/14/2013   Chest pain, exertional 09/13/2012   Pernicious anemia 09/07/2012   DIVERTICULOSIS, COLON 09/06/2009   Osteoporosis 09/06/2009   Hyperlipidemia 09/04/2009   Peripheral sensory neuropathy 09/04/2009   LOW BACK PAIN, CHRONIC 04/08/2007    ONSET DATE: September 2022  REFERRING DIAG: Parkinson's disease   THERAPY DIAG:  History of falling  Muscle weakness (generalized)  Rationale for Evaluation and Treatment Rehabilitation  SUBJECTIVE:                                                                                                                                                                                              SUBJECTIVE STATEMENT: Reports feeling "blah" today.   PERTINENT HISTORY: Parkinson's Disease, low back pain, bilateral TKA's, and multiple falls  PAIN:  Are you having pain? No  PRECAUTIONS: Fall  WEIGHT BEARING RESTRICTIONS No  FALLS: Has patient fallen in  last 6  months? Yes. Number of falls 3 about 2-3 weeks ago  LIVING ENVIRONMENT: Lives with: lives with their family Lives in: House/apartment Stairs:  she avoids going up or down stairs Has following equipment at home: Environmental consultant - 4 wheeled and Ramped entry  PLOF: [place] be safer and not falling    TRANSFERS: Assistive device utilized: None  Sit to stand: SBA Stand to sit: SBA  GAIT: Gait pattern: shuffling Assistive device utilized: Environmental consultant - 4 wheeled Level of assistance: Modified independence   FUNCTIONAL TESTs:  5 times sit to stand: 19.24 seconds without UE support Timed up and go (TUG): 21.5 seconds with rolling walker  TODAY'S TREATMENT:                                    8/15 EXERCISE LOG  Exercise Repetitions and Resistance Comments  Nustep  L3 x 15 minutes   Heel ups/toe ups 2x10 each   Marching  2x10   Standing ABD 2x10 each LE   6" forward step BLE x10 reps each   Sitting ball adduction 2x10 hol 3 secs   Sitting clam shell Red tband 2x10 hold     Blank cell = exercise not performed today    PATIENT EDUCATION: Education details: fall risk, plan of care, prognosis Person educated: Patient Education method: Explanation Education comprehension: verbalized understanding   HOME EXERCISE PROGRAM: Reviewed previous HEP     GOALS: Goals reviewed with patient? Yes  SHORT TERM GOALS: Target date: 04/04/2022  Patient will be independent with her initial HEP.  Baseline: Goal status: INITIAL  2.  Patient will improve her five time sit to stand to 15 seconds or less.  Baseline:  Goal status: INITIAL  3.  Patient will improve her TUG time to 15 seconds or less.  Baseline:  Goal status: INITIAL  LONG TERM GOALS: Target date: 04/25/2022  Patient will be independent with her advanced HEP.  Baseline:  Goal status: INITIAL  2.  Patient will improve her five time sit to stand time to 12 seconds or less.  Baseline:  Goal status: INITIAL  3.   Patient will improve her TUG time to 12 seconds or less.  Baseline:  Goal status: INITIAL  ASSESSMENT:  CLINICAL IMPRESSION: Patient presented in clinic with reports of feeling "blah" but no other complaints or pain today. Patient able to tolerate all therex but very fatigued with standing exercises. Patient required rest breaks for standing exercises due to fatigue. Patient admits to not being as active at home as she needs to be but many of her meds cause sleepiness. No other complaints upon dismissal.    OBJECTIVE IMPAIRMENTS Abnormal gait, decreased balance, decreased mobility, difficulty walking, decreased strength, and postural dysfunction.   ACTIVITY LIMITATIONS standing, stairs, transfers, and locomotion level  PARTICIPATION LIMITATIONS: cleaning, shopping, and community activity  PERSONAL FACTORS Time since onset of injury/illness/exacerbation, Transportation, and 3+ comorbidities: Parkinson's Disease, HTN, and osteoporosis  are also affecting patient's functional outcome.   REHAB POTENTIAL: Good  CLINICAL DECISION MAKING: Evolving/moderate complexity  EVALUATION COMPLEXITY: Moderate  PLAN: PT FREQUENCY: 2x/week  PT DURATION: 6 weeks  PLANNED INTERVENTIONS: Therapeutic exercises, Therapeutic activity, Neuromuscular re-education, Balance training, Gait training, Patient/Family education, Self Care, Stair training, and Re-evaluation  PLAN FOR NEXT SESSION: nustep, upper and lower extremity strengthening, BIG movements, and balance interventions   Standley Brooking, PTA 03/20/2022, 1:50 PM

## 2022-03-25 ENCOUNTER — Ambulatory Visit: Payer: Medicare Other | Admitting: *Deleted

## 2022-03-25 ENCOUNTER — Encounter: Payer: Self-pay | Admitting: *Deleted

## 2022-03-25 DIAGNOSIS — Z9181 History of falling: Secondary | ICD-10-CM | POA: Diagnosis not present

## 2022-03-25 DIAGNOSIS — M6281 Muscle weakness (generalized): Secondary | ICD-10-CM

## 2022-03-25 DIAGNOSIS — G20 Parkinson's disease: Secondary | ICD-10-CM | POA: Diagnosis not present

## 2022-03-25 NOTE — Therapy (Signed)
OUTPATIENT PHYSICAL THERAPY NEURO TREATMENT   Patient Name: Cassie White MRN: [id number] DOB:[birth year], 81 y.o., female Today's Date: 03/25/2022  REFERRING PROVIDER: Ludwig Clarks, DO   PT End of Session - 03/25/22 1115     Visit Number 4    Number of Visits 12    Date for PT Re-Evaluation 05/30/22    PT Start Time 1115    PT Stop Time 4098    PT Time Calculation (min) 49 min             Past Medical History:  Diagnosis Date   Abnormal mammogram of left breast    Likely benign microcalcifications--repeat L diag mammo 03/24/2017.  COMPLEX SCLEROSING LESION WITH CALCIFICATIONS --no sign of malignancy.  Excision recommended--pt has been referred to Dr. Donne Hazel, who recommended f/u mammo and this was normal 12/2017--consider rpt 1 yr per rad.   Arthritis    DJD, back and both hips   Chronic low back pain without sciatica    Lumbar spondylosis + scoliosis.  Summer 2017 Dr. Nelva Bush did ESI and pt got no relief.  Pt then saw Dr. Maia Petties [facility]: felt she had facet mediated pain; plan for B L345 MBB (??).   Cystocele, midline    Diverticulosis    on colonoscopy 2004   GERD (gastroesophageal reflux disease)    Hx of cardiovascular stress test    Lexiscan Myoview (2/14):  Low risk, small ant defect likely breast attenuation, no ischemia, EF 69% (no change from 2010).     Hyperlipidemia    Hypertension    per pt   Idiopathic scoliosis of thoracolumbar region    Lumbar back pain    Mitral regurgitation    Echo (2/14):  EF 60-65%, Gr 1 DD, mild AI, mild bileaflet MVP, mod post directed MR, mild LAE, PASP 35.   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  Responding to gabapentin (Dr. Brett Fairy).   Osteoporosis, senile    stable left hip 2015 (took fosamax x 68yr, T-score -2.7 when she stopped bisphosphonate).  Rpt DEXA 258moater (09/2016) T-score -.3.1 (different machine, though).  11/2019 DEXA T-score -4. Recommended restart of fosamax   PAF (paroxysmal atrial fibrillation) (HCC)    Dr. JoMartiniqueFlecainide, metopr, apxiaban.  08/2017-->persistent a-fib w/RVR, flecainide increased and she converted back to NSR and felt better.  '100mg'$  bid flecainide continued.   [place]'s disease [place] dx   started sinemet 05/2019 (Dr. TaCarles Collet  Peripheral neuropathy 2008   Idiopathic vs familial (motor>sensory) vs lumbar disc dz: gabapentin helpful   Shoulder pain left   RC surg 09/2014   Past Surgical History:  Procedure Laterality Date   BREAST BIOPSY  04/10/2017   Left breast core needle biopsy of calcifications.  COMPLEX SCLEROSING LESION WITH CALCIFICATIONS --no sign of malignancy.  Excision recommended--pt has been referred to Dr. WaDonne Hazelwho recommended rpt mammo and this was NORMAL 12/2017.   BREAST EXCISIONAL BIOPSY Right 2010   Benign   BREAST SURGERY  Nov 2010   Benign biopsy, right   CHOLECYSTECTOMY  03/2010   Dr.Gross   COLONOSCOPY  10/03/2002   No polyps.  Repeat 10 yrs recommended but pt declines.  Pt declined cologuard 08/2016 but changed her mind and this test was NEG on 03/23/17.   COMBINED HYSTERECTOMY VAGINAL / OOPHORECTOMY / A&P REPAIR  [id number] uncertain if ovaries were removed or not   CYSTOCELE REPAIR  DEXA  11/2019   T score -4   Lumpectomy  06/2009   "Fatty Necrosis"   PFT's  2014   NORMAL   REVERSE SHOULDER ARTHROPLASTY Right 03/11/2019   Procedure: REVERSE TOTAL SHOULDER ARTHROPLASTY;  Surgeon: Netta Cedars, MD;  Location: [facility];  Service: Orthopedics;  Laterality: Right;  Intersclene block   ROTATOR CUFF REPAIR Left 10/02/14   Dr. Veverly Fells   TONSILLECTOMY     TOTAL KNEE ARTHROPLASTY  09/2009   Right ;Dr Alvan Dame   TOTAL KNEE ARTHROPLASTY Left 03/10/2016   Procedure: TOTAL KNEE ARTHROPLASTY;  Surgeon: Paralee Cancel, MD;  Location: [facility];  Service: Orthopedics;  Laterality: Left;   TRANSTHORACIC ECHOCARDIOGRAM  09/2012; 09/27/15   2014: EF 60-65%, Gr 1  DD, mild AI, mild bileaflet MVP, mod post directed MR, mild LAE, PASP 35.  Repeat 2017: EF 55-60%, mild AR, mild MVP and mild MV regurg.   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   Pain in joint of right shoulder 01/10/2019   Pain in both feet 12/14/2017   Neuropathy 12/14/2017   Benign essential tremor 12/14/2017   Paroxysmal atrial fibrillation ([place]) 12/14/2017   Chronic low back pain without sciatica 12/14/2017   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   Snoring 05/19/2017   S/P knee replacement 03/10/2016   Female bladder prolapse 12/06/2015   Paroxysmal atrial fibrillation with rapid ventricular response ([place]) 09/28/2015   Maxillary sinusitis, acute 08/27/2015   Mitral insufficiency 05/12/2014   HTN (hypertension) 04/28/2014   Lactic acidosis 09/07/2013   Mitral valve prolapse 06/14/2013   Chest pain, exertional 09/13/2012   Pernicious anemia 09/07/2012   DIVERTICULOSIS, COLON 09/06/2009   Osteoporosis 09/06/2009   Hyperlipidemia 09/04/2009   Peripheral sensory neuropathy 09/04/2009   LOW BACK PAIN, CHRONIC 04/08/2007    ONSET DATE: September 2022  REFERRING DIAG: Parkinson's disease   THERAPY DIAG:  Muscle weakness (generalized)  History of falling  Rationale for Evaluation and Treatment Rehabilitation  SUBJECTIVE:                                                                                                                                                                                              SUBJECTIVE STATEMENT: Pt reports doing okay today with energy levels.   PERTINENT HISTORY: Parkinson's Disease, low back pain, bilateral TKA's, and multiple falls  PAIN:  Are you having pain? No  PRECAUTIONS: Fall  WEIGHT BEARING RESTRICTIONS No  FALLS: Has patient fallen in last 6  months? Yes. Number of falls 3 about 2-3  weeks ago  LIVING ENVIRONMENT: Lives with: lives with their family Lives in: House/apartment Stairs:  she avoids going up or down stairs Has following equipment at home: Environmental consultant - 4 wheeled and Ramped entry  PLOF: [place] be safer and not falling    TRANSFERS: Assistive device utilized: None  Sit to stand: SBA Stand to sit: SBA  GAIT: Gait pattern: shuffling Assistive device utilized: Environmental consultant - 4 wheeled Level of assistance: Modified independence   FUNCTIONAL TESTs:  5 times sit to stand: 19.24 seconds without UE support Timed up and go (TUG): 21.5 seconds with rolling walker  TODAY'S TREATMENT:                                    8/22 EXERCISE LOG  Exercise Repetitions and Resistance Comments  Nustep  L3 x 15 minutes   Heel ups/toe ups 2x10 each   Marching  2x10   Standing ABD x10 each LE   6" forward step RLE x10 reps, unable to perform on LT   Sitting ball adduction 2x10 hol 10 secs   Sitting clam shell Red tband 2x10 hold 5 secs   Rocker Board X 3 mins DF/PF balance CGA   Sit to standfrom mat table 2x5     no UE assist    Blank cell = exercise not performed today    PATIENT EDUCATION: Education details: fall risk, plan of care, prognosis Person educated: Patient Education method: Explanation Education comprehension: verbalized understanding   HOME EXERCISE PROGRAM: Reviewed previous HEP     GOALS: Goals reviewed with patient? Yes  SHORT TERM GOALS: Target date: 04/04/2022  Patient will be independent with her initial HEP.  Baseline: Goal status: INITIAL  2.  Patient will improve her five time sit to stand to 15 seconds or less.  Baseline:  Goal status: INITIAL  3.  Patient will improve her TUG time to 15 seconds or less.  Baseline:  Goal status: INITIAL  LONG TERM GOALS: Target date: 04/25/2022  Patient will be independent with her advanced HEP.  Baseline:  Goal status: INITIAL  2.  Patient will improve her five time  sit to stand time to 12 seconds or less.  Baseline:  Goal status: INITIAL  3.  Patient will improve her TUG time to 12 seconds or less.  Baseline:  Goal status: INITIAL  ASSESSMENT:  CLINICAL IMPRESSION: Patient presented in clinic doing fair today with energy levels and was able to complete all exs with short breaks as needed. She reports LT LE being weaker than RT and was unable to perform step up today with LT LE. Improved sit to stand today without UE assist.   OBJECTIVE IMPAIRMENTS Abnormal gait, decreased balance, decreased mobility, difficulty walking, decreased strength, and postural dysfunction.   ACTIVITY LIMITATIONS standing, stairs, transfers, and locomotion level  PARTICIPATION LIMITATIONS: cleaning, shopping, and community activity  PERSONAL FACTORS Time since onset of injury/illness/exacerbation, Transportation, and 3+ comorbidities: Parkinson's Disease, HTN, and osteoporosis  are also affecting patient's functional outcome.   REHAB POTENTIAL: Good  CLINICAL DECISION MAKING: Evolving/moderate complexity  EVALUATION COMPLEXITY: Moderate  PLAN: PT FREQUENCY: 2x/week  PT DURATION: 6 weeks  PLANNED INTERVENTIONS: Therapeutic exercises, Therapeutic activity, Neuromuscular re-education, Balance training, Gait training, Patient/Family education, Self Care, Stair training, and Re-evaluation  PLAN FOR NEXT SESSION: nustep, upper and lower extremity strengthening, BIG movements, and  balance interventions   Rayvon Dakin,CHRIS, PTA 03/25/2022, 5:53 PM

## 2022-03-27 ENCOUNTER — Ambulatory Visit: Payer: Medicare Other | Admitting: *Deleted

## 2022-03-27 DIAGNOSIS — Z9181 History of falling: Secondary | ICD-10-CM | POA: Diagnosis not present

## 2022-03-27 DIAGNOSIS — M6281 Muscle weakness (generalized): Secondary | ICD-10-CM | POA: Diagnosis not present

## 2022-03-27 DIAGNOSIS — G20 Parkinson's disease: Secondary | ICD-10-CM | POA: Diagnosis not present

## 2022-03-27 NOTE — Therapy (Signed)
OUTPATIENT PHYSICAL THERAPY NEURO TREATMENT   Patient Name: Cassie White MRN: [id number] DOB:Apr 29, 1941, 81 y.o., female Today's Date: 03/27/2022  REFERRING PROVIDER: Ludwig Clarks, DO   PT End of Session - 03/27/22 1144     Visit Number 5    Number of Visits 12    Date for PT Re-Evaluation 05/30/22    PT Start Time 1115    PT Stop Time 1203    PT Time Calculation (min) 48 min             Past Medical History:  Diagnosis Date   Abnormal mammogram of left breast    Likely benign microcalcifications--repeat L diag mammo 03/24/2017.  COMPLEX SCLEROSING LESION WITH CALCIFICATIONS --no sign of malignancy.  Excision recommended--pt has been referred to Dr. Donne Hazel, who recommended f/u mammo and this was normal 12/2017--consider rpt 1 yr per rad.   Arthritis    DJD, back and both hips   Chronic low back pain without sciatica    Lumbar spondylosis + scoliosis.  Summer 2017 Dr. Nelva Bush did ESI and pt got no relief.  Pt then saw Dr. Maia Petties [facility]: felt she had facet mediated pain; plan for B L345 MBB (??).   Cystocele, midline    Diverticulosis    on colonoscopy 2004   GERD (gastroesophageal reflux disease)    Hx of cardiovascular stress test    Lexiscan Myoview (2/14):  Low risk, small ant defect likely breast attenuation, no ischemia, EF 69% (no change from 2010).     Hyperlipidemia    Hypertension    per pt   Idiopathic scoliosis of thoracolumbar region    Lumbar back pain    Mitral regurgitation    Echo (2/14):  EF 60-65%, Gr 1 DD, mild AI, mild bileaflet MVP, mod post directed MR, mild LAE, PASP 35.   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  Responding to gabapentin (Dr. Brett Fairy).   Osteoporosis, senile    stable left hip 2015 (took fosamax x 104yr, T-score -2.7 when she stopped bisphosphonate).  Rpt DEXA 282moater (09/2016) T-score -.3.1 (different machine, though).  11/2019 DEXA T-score -4. Recommended restart of fosamax   PAF (paroxysmal atrial fibrillation) (HCC)    Dr. JoMartiniqueFlecainide, metopr, apxiaban.  08/2017-->persistent a-fib w/RVR, flecainide increased and she converted back to NSR and felt better.  10032mid flecainide continued.   Parkinson's disease [place] dx   started sinemet 05/2019 (Dr. TatCarles Collet Peripheral neuropathy 2008   Idiopathic vs familial (motor>sensory) vs lumbar disc dz: gabapentin helpful   Shoulder pain left   RC surg 09/2014   Past Surgical History:  Procedure Laterality Date   BREAST BIOPSY  04/10/2017   Left breast core needle biopsy of calcifications.  COMPLEX SCLEROSING LESION WITH CALCIFICATIONS --no sign of malignancy.  Excision recommended--pt has been referred to Dr. WakDonne Hazelho recommended rpt mammo and this was NORMAL 12/2017.   BREAST EXCISIONAL BIOPSY Right 2010   Benign   BREAST SURGERY  Nov 2010   Benign biopsy, right   CHOLECYSTECTOMY  03/2010   Dr.Gross   COLONOSCOPY  10/03/2002   No polyps.  Repeat 10 yrs recommended but pt declines.  Pt declined cologuard 08/2016 but changed her mind and this test was NEG on 03/23/17.   COMBINED HYSTERECTOMY VAGINAL / OOPHORECTOMY / A&P REPAIR  1996010uncertain if ovaries were removed or not   CYSTOCELE REPAIR  DEXA  11/2019   T score -4   Lumpectomy  06/2009   "Fatty Necrosis"   PFT's  2014   NORMAL   REVERSE SHOULDER ARTHROPLASTY Right 03/11/2019   Procedure: REVERSE TOTAL SHOULDER ARTHROPLASTY;  Surgeon: Netta Cedars, MD;  Location: [facility];  Service: Orthopedics;  Laterality: Right;  Intersclene block   ROTATOR CUFF REPAIR Left 10/02/14   Dr. Veverly Fells   TONSILLECTOMY     TOTAL KNEE ARTHROPLASTY  09/2009   Right ;Dr Alvan Dame   TOTAL KNEE ARTHROPLASTY Left 03/10/2016   Procedure: TOTAL KNEE ARTHROPLASTY;  Surgeon: Paralee Cancel, MD;  Location: [facility];  Service: Orthopedics;  Laterality: Left;   TRANSTHORACIC ECHOCARDIOGRAM  09/2012; 09/27/15   2014: EF 60-65%, Gr 1  DD, mild AI, mild bileaflet MVP, mod post directed MR, mild LAE, PASP 35.  Repeat 2017: EF 55-60%, mild AR, mild MVP and mild MV regurg.   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   Pain in joint of right shoulder 01/10/2019   Pain in both feet 12/14/2017   Neuropathy 12/14/2017   Benign essential tremor 12/14/2017   Paroxysmal atrial fibrillation ([place]) 12/14/2017   Chronic low back pain without sciatica 12/14/2017   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   Snoring 05/19/2017   S/P knee replacement 03/10/2016   Female bladder prolapse 12/06/2015   Paroxysmal atrial fibrillation with rapid ventricular response ([place]) 09/28/2015   Maxillary sinusitis, acute 08/27/2015   Mitral insufficiency 05/12/2014   HTN (hypertension) 04/28/2014   Lactic acidosis 09/07/2013   Mitral valve prolapse 06/14/2013   Chest pain, exertional 09/13/2012   Pernicious anemia 09/07/2012   DIVERTICULOSIS, COLON 09/06/2009   Osteoporosis 09/06/2009   Hyperlipidemia 09/04/2009   Peripheral sensory neuropathy 09/04/2009   LOW BACK PAIN, CHRONIC 04/08/2007    ONSET DATE: September 2022  REFERRING DIAG: Parkinson's disease   THERAPY DIAG:  Muscle weakness (generalized)  History of falling  Rationale for Evaluation and Treatment Rehabilitation  SUBJECTIVE:                                                                                                                                                                                              SUBJECTIVE STATEMENT: Pt reports fatigued after last   PERTINENT HISTORY: Parkinson's Disease, low back pain, bilateral TKA's, and multiple falls  PAIN:  Are you having pain? No  PRECAUTIONS: Fall  WEIGHT BEARING RESTRICTIONS No  FALLS: Has patient fallen in last 6 months? Yes. Number  of falls 3 about 2-3 weeks ago  LIVING  ENVIRONMENT: Lives with: lives with their family Lives in: House/apartment Stairs:  she avoids going up or down stairs Has following equipment at home: Environmental consultant - 4 wheeled and Ramped entry  PLOF: [place] be safer and not falling    TRANSFERS: Assistive device utilized: None  Sit to stand: SBA Stand to sit: SBA  GAIT: Gait pattern: shuffling Assistive device utilized: Environmental consultant - 4 wheeled Level of assistance: Modified independence   FUNCTIONAL TESTs:  5 times sit to stand: 19.24 seconds without UE support Timed up and go (TUG): 21.5 seconds with rolling walker  TODAY'S TREATMENT:                                    8/24 EXERCISE LOG  Exercise Repetitions and Resistance Comments  Nustep  L3 x 15 minutes   Heel ups/toe ups 2x10 each   Marching sitting and standing 2x10 Bil   Standing ABD 2x10 each  BLE   6" forward step BLE x10 reps,    Sitting ball adduction 2x10 hol 10 secs   Sitting clam shell Red tband 3x10 hold    Rocker Board X 3 mins DF/PF balance CGA   Sit to standfrom mat table 1x5     no UE assist x12 secs LTG MET   Blank cell = exercise not performed today    PATIENT EDUCATION: Education details: fall risk, plan of care, prognosis Person educated: Patient Education method: Explanation Education comprehension: verbalized understanding   HOME EXERCISE PROGRAM: Reviewed previous HEP     GOALS: Goals reviewed with patient? Yes  SHORT TERM GOALS: Target date: 04/04/2022  Patient will be independent with her initial HEP.  Baseline: Goal status: INITIAL  2.  Patient will improve her five time sit to stand to 15 seconds or less.  Baseline:  Goal status: INITIAL  3.  Patient will improve her TUG time to 15 seconds or less.  Baseline:  Goal status: INITIAL  LONG TERM GOALS: Target date: 04/25/2022  Patient will be independent with her advanced HEP.  Baseline:  Goal status: INITIAL  2.  Patient will improve her five time sit to  stand time to 12 seconds or less.  Baseline:  Goal status: INITIAL  3.  Patient will improve her TUG time to 12 seconds or less.  Baseline:  Goal status: INITIAL  ASSESSMENT:  CLINICAL IMPRESSION:  Patient arrived to clinic doing fairly well.She was able to complete all exs with short breaks as needed. Improved performance today and was able to meet LTG for sit to stand.  OBJECTIVE IMPAIRMENTS Abnormal gait, decreased balance, decreased mobility, difficulty walking, decreased strength, and postural dysfunction.   ACTIVITY LIMITATIONS standing, stairs, transfers, and locomotion level  PARTICIPATION LIMITATIONS: cleaning, shopping, and community activity  PERSONAL FACTORS Time since onset of injury/illness/exacerbation, Transportation, and 3+ comorbidities: Parkinson's Disease, HTN, and osteoporosis  are also affecting patient's functional outcome.   REHAB POTENTIAL: Good  CLINICAL DECISION MAKING: Evolving/moderate complexity  EVALUATION COMPLEXITY: Moderate  PLAN: PT FREQUENCY: 2x/week  PT DURATION: 6 weeks  PLANNED INTERVENTIONS: Therapeutic exercises, Therapeutic activity, Neuromuscular re-education, Balance training, Gait training, Patient/Family education, Self Care, Stair training, and Re-evaluation  PLAN FOR NEXT SESSION: nustep, upper and lower extremity strengthening, BIG movements, and balance interventions   Bijal Siglin,CHRIS, PTA 03/27/2022, 1:48 PM

## 2022-03-31 ENCOUNTER — Ambulatory Visit: Payer: Medicare Other

## 2022-03-31 DIAGNOSIS — Z9181 History of falling: Secondary | ICD-10-CM

## 2022-03-31 DIAGNOSIS — G20 Parkinson's disease: Secondary | ICD-10-CM | POA: Diagnosis not present

## 2022-03-31 DIAGNOSIS — M6281 Muscle weakness (generalized): Secondary | ICD-10-CM

## 2022-03-31 NOTE — Therapy (Addendum)
OUTPATIENT PHYSICAL THERAPY NEURO TREATMENT   Patient Name: Cassie White MRN: [id number] DOB:1941/06/16, 81 y.o., female Today's Date: 03/31/2022  REFERRING PROVIDER: Ludwig Clarks, DO   PT End of Session - 03/31/22 1259     Visit Number 6    Number of Visits 12    Date for PT Re-Evaluation 05/30/22    PT Start Time 1300    Activity Tolerance Patient tolerated treatment well    Behavior During Therapy [facility] for tasks assessed/performed             Past Medical History:  Diagnosis Date   Abnormal mammogram of left breast    Likely benign microcalcifications--repeat L diag mammo 03/24/2017.  COMPLEX SCLEROSING LESION WITH CALCIFICATIONS --no sign of malignancy.  Excision recommended--pt has been referred to Dr. Donne Hazel, who recommended f/u mammo and this was normal 12/2017--consider rpt 1 yr per rad.   Arthritis    DJD, back and both hips   Chronic low back pain without sciatica    Lumbar spondylosis + scoliosis.  Summer 2017 Dr. Nelva Bush did ESI and pt got no relief.  Pt then saw Dr. Maia Petties [facility]: felt she had facet mediated pain; plan for B L345 MBB (??).   Cystocele, midline    Diverticulosis    on colonoscopy 2004   GERD (gastroesophageal reflux disease)    Hx of cardiovascular stress test    Lexiscan Myoview (2/14):  Low risk, small ant defect likely breast attenuation, no ischemia, EF 69% (no change from 2010).     Hyperlipidemia    Hypertension    per pt   Idiopathic scoliosis of thoracolumbar region    Lumbar back pain    Mitral regurgitation    Echo (2/14):  EF 60-65%, Gr 1 DD, mild AI, mild bileaflet MVP, mod post directed MR, mild LAE, PASP 35.   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  Responding to gabapentin (Dr. Brett Fairy).   Osteoporosis, senile    stable left hip 2015 (took fosamax x 20yr, T-score -2.7 when she stopped bisphosphonate).  Rpt DEXA 29moater  (09/2016) T-score -.3.1 (different machine, though). 11/2019 DEXA T-score -4. Recommended restart of fosamax   PAF (paroxysmal atrial fibrillation) (HCC)    Dr. JoMartiniqueFlecainide, metopr, apxiaban.  08/2017-->persistent a-fib w/RVR, flecainide increased and she converted back to NSR and felt better.  10010mid flecainide continued.   Parkinson's disease ([id number] dx   started sinemet 05/2019 (Dr. TatCarles Collet Peripheral neuropathy 2008   Idiopathic vs familial (motor>sensory) vs lumbar disc dz: gabapentin helpful   Shoulder pain left   RC surg 09/2014   Past Surgical History:  Procedure Laterality Date   BREAST BIOPSY  04/10/2017   Left breast core needle biopsy of calcifications.  COMPLEX SCLEROSING LESION WITH CALCIFICATIONS --no sign of malignancy.  Excision recommended--pt has been referred to Dr. WakDonne Hazelho recommended rpt mammo and this was NORMAL 12/2017.   BREAST EXCISIONAL BIOPSY Right 2010   Benign   BREAST SURGERY  Nov 2010   Benign biopsy, right   CHOLECYSTECTOMY  03/2010   Dr.Gross   COLONOSCOPY  10/03/2002   No polyps.  Repeat 10 yrs recommended but pt declines.  Pt declined cologuard 08/2016 but changed her mind and this test was NEG on 03/23/17.   COMBINED HYSTERECTOMY VAGINAL / OOPHORECTOMY / A&P REPAIR  1998184uncertain if ovaries were removed or not  CYSTOCELE REPAIR     DEXA  11/2019   T score -4   Lumpectomy  06/2009   "Fatty Necrosis"   PFT's  2014   NORMAL   REVERSE SHOULDER ARTHROPLASTY Right 03/11/2019   Procedure: REVERSE TOTAL SHOULDER ARTHROPLASTY;  Surgeon: Netta Cedars, MD;  Location: [facility];  Service: Orthopedics;  Laterality: Right;  Intersclene block   ROTATOR CUFF REPAIR Left 10/02/14   Dr. Veverly Fells   TONSILLECTOMY     TOTAL KNEE ARTHROPLASTY  09/2009   Right ;Dr Alvan Dame   TOTAL KNEE ARTHROPLASTY Left 03/10/2016   Procedure: TOTAL KNEE ARTHROPLASTY;  Surgeon: Paralee Cancel, MD;  Location: [facility];  Service: Orthopedics;  Laterality: Left;   TRANSTHORACIC  ECHOCARDIOGRAM  09/2012; 09/27/15   2014: EF 60-65%, Gr 1 DD, mild AI, mild bileaflet MVP, mod post directed MR, mild LAE, PASP 35.  Repeat 2017: EF 55-60%, mild AR, mild MVP and mild MV regurg.   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   Pain in joint of right shoulder 01/10/2019   Pain in both feet 12/14/2017   Neuropathy 12/14/2017   Benign essential tremor 12/14/2017   Paroxysmal atrial fibrillation ([place]) 12/14/2017   Chronic low back pain without sciatica 12/14/2017   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   Snoring 05/19/2017   S/P knee replacement 03/10/2016   Female bladder prolapse 12/06/2015   Paroxysmal atrial fibrillation with rapid ventricular response ([place]) 09/28/2015   Maxillary sinusitis, acute 08/27/2015   Mitral insufficiency 05/12/2014   HTN (hypertension) 04/28/2014   Lactic acidosis 09/07/2013   Mitral valve prolapse 06/14/2013   Chest pain, exertional 09/13/2012   Pernicious anemia 09/07/2012   DIVERTICULOSIS, COLON 09/06/2009   Osteoporosis 09/06/2009   Hyperlipidemia 09/04/2009   Peripheral sensory neuropathy 09/04/2009   LOW BACK PAIN, CHRONIC 04/08/2007    ONSET DATE: September 2022  REFERRING DIAG: Parkinson's disease   THERAPY DIAG:  Muscle weakness (generalized)  History of falling  Rationale for Evaluation and Treatment Rehabilitation  SUBJECTIVE:                                                                                                                                                                                              SUBJECTIVE STATEMENT: Patient reports that she feels good today.    PERTINENT HISTORY: Parkinson's Disease, low back pain, bilateral TKA's, and multiple falls  PAIN:  Are you having pain? No  PRECAUTIONS: Fall  WEIGHT BEARING RESTRICTIONS No  FALLS:  Has patient fallen  in last 6 months? Yes. Number of falls 3 about 2-3 weeks ago  LIVING ENVIRONMENT: Lives with: lives with their family Lives in: House/apartment Stairs:  she avoids going up or down stairs Has following equipment at home: Environmental consultant - 4 wheeled and Ramped entry  PLOF: [place] be safer and not falling    TRANSFERS: Assistive device utilized: None  Sit to stand: SBA Stand to sit: SBA  GAIT: Gait pattern: shuffling Assistive device utilized: Environmental consultant - 4 wheeled Level of assistance: Modified independence   FUNCTIONAL TESTs:  5 times sit to stand: 19.24 seconds without UE support Timed up and go (TUG): 21.5 seconds with rolling walker  TODAY'S TREATMENT:                                    8/28 EXERCISE LOG  Exercise Repetitions and Resistance Comments  Nustep  L3-4 x 16 minutes   Step up  8" step; 2 minutes alternating LE Cueing for improved foot clearance  Seated marching 2 minutes   LAQ 3 lbs x 2 minutes   Heel/ toe raises 3 minutes   Standing hip ABD  10 reps  Limited by fatigue  Seated clams Red t-band x 2 minutes   Sit to stand 4 reps without UE support    Blank cell = exercise not performed today                                    8/24 EXERCISE LOG  Exercise Repetitions and Resistance Comments  Nustep  L3 x 15 minutes   Heel ups/toe ups 2x10 each   Marching sitting and standing 2x10 Bil   Standing ABD 2x10 each  BLE   6" forward step BLE x10 reps,    Sitting ball adduction 2x10 hol 10 secs   Sitting clam shell Red tband 3x10 hold    Rocker Board X 3 mins DF/PF balance CGA   Sit to standfrom mat table 1x5     no UE assist x12 secs LTG MET   Blank cell = exercise not performed today    PATIENT EDUCATION: Education details: fall risk, plan of care, prognosis Person educated: Patient Education method: Explanation Education comprehension: verbalized understanding   HOME EXERCISE PROGRAM: Reviewed previous HEP     GOALS: Goals reviewed  with patient? Yes  SHORT TERM GOALS: Target date: 04/04/2022  Patient will be independent with her initial HEP.  Baseline: Goal status: INITIAL  2.  Patient will improve her five time sit to stand to 15 seconds or less.  Baseline:  Goal status: INITIAL  3.  Patient will improve her TUG time to 15 seconds or less.  Baseline:  Goal status: INITIAL  LONG TERM GOALS: Target date: 04/25/2022  Patient will be independent with her advanced HEP.  Baseline:  Goal status: INITIAL  2.  Patient will improve her five time sit to stand time to 12 seconds or less.  Baseline:  Goal status: INITIAL  3.  Patient will improve her TUG time to 12 seconds or less.  Baseline:  Goal status: INITIAL  ASSESSMENT:  CLINICAL IMPRESSION:  Patient was progress with familiar interventions for improved lower extremity strength and endurance with moderate difficulty and fatigue. She required minimal cueing with step ups for improved foot clearance and safety when  navigating steps. She required multiple seated rest breaks throughout treatment due to fatigue. She reported feeling tired upon the conclusion of treatment. She continues to require skilled physical therapy to address her remaining impairments to maximize her functional mobility and safety.   PHYSICAL THERAPY DISCHARGE SUMMARY  Visits from Start of Care: 6  Current functional level related to goals / functional outcomes: Patient was unable to meet her goals for skilled physical therapy.    Remaining deficits: Lower extremity strength and power    Education / Equipment: HEP   Patient agrees to discharge. Patient goals were not met. Patient is being discharged due to not returning since the last visit.  Jacqulynn Cadet, PT, DPT    OBJECTIVE IMPAIRMENTS Abnormal gait, decreased balance, decreased mobility, difficulty walking, decreased strength, and postural dysfunction.   ACTIVITY LIMITATIONS standing, stairs, transfers, and locomotion  level  PARTICIPATION LIMITATIONS: cleaning, shopping, and community activity  PERSONAL FACTORS Time since onset of injury/illness/exacerbation, Transportation, and 3+ comorbidities: Parkinson's Disease, HTN, and osteoporosis  are also affecting patient's functional outcome.   REHAB POTENTIAL: Good  CLINICAL DECISION MAKING: Evolving/moderate complexity  EVALUATION COMPLEXITY: Moderate  PLAN: PT FREQUENCY: 2x/week  PT DURATION: 6 weeks  PLANNED INTERVENTIONS: Therapeutic exercises, Therapeutic activity, Neuromuscular re-education, Balance training, Gait training, Patient/Family education, Self Care, Stair training, and Re-evaluation  PLAN FOR NEXT SESSION: nustep, upper and lower extremity strengthening, BIG movements, and balance interventions   Darlin Coco, PT 03/31/2022, 1:10 PM

## 2022-04-01 DIAGNOSIS — L218 Other seborrheic dermatitis: Secondary | ICD-10-CM | POA: Diagnosis not present

## 2022-04-01 DIAGNOSIS — L570 Actinic keratosis: Secondary | ICD-10-CM | POA: Diagnosis not present

## 2022-04-04 ENCOUNTER — Telehealth: Payer: Self-pay | Admitting: Family Medicine

## 2022-04-04 NOTE — Telephone Encounter (Signed)
Pt's daughter called stating her mother is having concerns with hair loss coming out in clumps. I advised scheduling an appt but she declined. This has been going on for a few months now and she is unsure if it may be medication related.

## 2022-04-08 ENCOUNTER — Telehealth: Payer: Self-pay

## 2022-04-08 NOTE — Telephone Encounter (Signed)
Spoke with patient's daughter, advised provider appointment would be best. She agreed and appt has been scheduled.

## 2022-04-08 NOTE — Telephone Encounter (Signed)
LVM for pt to CB and schedule Medicare Annual Wellness Visit (AWV).   Due 04/28/22

## 2022-04-14 NOTE — Progress Notes (Unsigned)
Assessment/Plan:   1.  Parkinsons Disease  -Continue carbidopa/levodopa 25/100, 2  tablet 3 times per day.  -start carbidopa/levodopa 50/200 CR at bed to see if helps rls  -We discussed that it used to be thought that levodopa would increase risk of melanoma but now it is believed that Parkinsons itself likely increases risk of melanoma. she is to get regular skin checks.  She is doing this.  -reiterated to her/daughter that Parkinsons Disease doesn't cause hair loss.  She is working with derm.  -she is in PT  -encouraged exercise.  I think that her endurance issues related to deconditioning  2.  Insomnia  -continue trazodone, 50 mg q hs.  Risk, benefits, side effects discussed   Subjective:   Cassie White was seen today in follow up for Parkinsons disease.  My previous records were reviewed prior to todays visit as well as outside records available to me.  The patient with daughter who supplements the hx.   last visit, we discussed the importance of physical therapy and although reluctant, she was agreeable.  She has been doing physical therapy.  Notes have been reviewed.  She called me at the end of July with increasing falls and more nocturia for about a week.  We asked her to follow-up with her primary care physician to make sure that she did not have something like a urinary tract infection.  She did that, and ultimately did have a urinary tract infection.  Primary care treated her with antibiotics and saw her back in follow-up, at which time she was back to baseline.  She reports today that she has had no falls since.  She is trying to hydrate better.  Notes are reviewed.  We added trazodone last visit for her complaints of insomnia.  She reports today that this is "great" and has "really helped."  "I love those pills."  No cramping at night.  Some restlessness of the legs after goes to bed at night   Current prescribed movement disorder medications: Carbidopa/levodopa 25/100, 2  tablets 3 times per day  Trazodone, 50 mg nightly (added last visit)  PREVIOUS MEDICATIONS: Sinemet  ALLERGIES:   Allergies  Allergen Reactions   Sulfa Antibiotics Swelling    face   Tizanidine Other (See Comments)    delirium    CURRENT MEDICATIONS:  Outpatient Encounter Medications as of 04/15/2022  Medication Sig   apixaban (ELIQUIS) 5 MG TABS tablet Take 1 tablet (5 mg total) by mouth 2 (two) times daily.   betamethasone dipropionate 0.05 % lotion SMARTSIG:1 sparingly Topical Daily   Biotin 5000 MCG CAPS Take 5,000 mcg by mouth daily.   Calcium Carbonate (CALCIUM 600 PO) Take 600 mg by mouth 2 (two) times daily.    carbidopa-levodopa (SINEMET IR) 25-100 MG tablet Take 2 tablets by mouth 3 (three) times daily.   cefdinir (OMNICEF) 300 MG capsule Take 1 capsule (300 mg total) by mouth 2 (two) times daily.   cyanocobalamin (,VITAMIN B-12,) 1000 MCG/ML injection INJECT 1 ML (1,000 MCG TOTAL) INTO THE MUSCLE EVERY 30 DAYS.   flecainide (TAMBOCOR) 100 MG tablet Take 1 tablet (100 mg total) by mouth 2 (two) times daily. Please keep upcoming appointment with Dr Martinique for any further refills.   fluticasone (FLONASE) 50 MCG/ACT nasal spray Place 2 sprays into both nostrils daily.   gabapentin (NEURONTIN) 300 MG capsule TAKE 2 CAPSULES BY MOUTH TWICE A DAY   metoprolol tartrate (LOPRESSOR) 25 MG tablet TAKE 1 TABLET BY  MOUTH TWICE A DAY   nitroGLYCERIN (NITROSTAT) 0.4 MG SL tablet Place 1 tablet (0.4 mg total) under the tongue every 5 (five) minutes as needed for chest pain.   Omega-3 Fatty Acids (FISH OIL) 1200 MG CAPS Take 1,000 mg by mouth 2 (two) times daily.    pravastatin (PRAVACHOL) 40 MG tablet Take 1 tablet (40 mg total) by mouth daily.   raloxifene (EVISTA) 60 MG tablet Take 1 tablet (60 mg total) by mouth daily.   traZODone (DESYREL) 50 MG tablet TAKE 1 TABLET BY MOUTH EVERYDAY AT BEDTIME   No facility-administered encounter medications on file as of 04/15/2022.    Objective:    PHYSICAL EXAMINATION:    VITALS:   Vitals:   04/15/22 1119  BP: 135/86  Pulse: 67  SpO2: 97%  Weight: 145 lb 6.4 oz (66 kg)  Height: '5\' 1"'$  (1.549 m)      GEN:  The patient appears stated age and is in NAD. HEENT:  Normocephalic, atraumatic.  The mucous membranes are moist. The superficial temporal arteries are without ropiness or tenderness. CV:  rrr Lungs:  CTAB Neck/HEME:  There are no carotid bruits bilaterally.  Neurological examination:  Orientation: The patient is alert and oriented x3. Cranial nerves: There is good facial symmetry with min facial hypomimia. The speech is fluent and clear. Soft palate rises symmetrically and there is no tongue deviation. Hearing is intact to conversational tone. Sensation: Sensation is intact to light touch throughout Motor: Strength is at least antigravity x4.  Movement examination: Tone: There is nl tone in the UE/LE Abnormal movements: none Coordination:  There is no decremation with RAM's, with any form of RAMS, including alternating supination and pronation of the forearm, hand opening and closing, finger taps, heel taps and toe taps. Gait and Station: The patient has min difficulty arising out of a deep-seated chair without the use of the hands. The patient is antalgic.  She is forward flexed.  She drags the legs with ambulation.  Ambulates with rollator  I have reviewed and interpreted the following labs independently    Chemistry      Component Value Date/Time   NA 141 09/06/2021 1125   NA 145 (H) 06/02/2018 1514   K 4.3 09/06/2021 1125   CL 105 09/06/2021 1125   CO2 32 09/06/2021 1125   BUN 19 09/06/2021 1125   BUN 18 06/02/2018 1514   CREATININE 0.90 09/06/2021 1125      Component Value Date/Time   CALCIUM 9.9 09/06/2021 1125   ALKPHOS 62 09/06/2021 1125   AST 18 09/06/2021 1125   ALT 3 09/06/2021 1125   BILITOT 0.8 09/06/2021 1125       Lab Results  Component Value Date   WBC 5.7 02/26/2021   HGB 13.4  02/26/2021   HCT 40.5 02/26/2021   MCV 93.7 02/26/2021   PLT 157.0 02/26/2021    Lab Results  Component Value Date   TSH 1.32 04/21/2019     Total time spent on today's visit was 21 minutes, including both face-to-face time and nonface-to-face time.  Time included that spent on review of records (prior notes available to me/labs/imaging if pertinent), discussing treatment and goals, answering patient's questions and coordinating care.  Cc:  Tammi Sou, MD

## 2022-04-15 ENCOUNTER — Encounter: Payer: Self-pay | Admitting: Neurology

## 2022-04-15 ENCOUNTER — Ambulatory Visit: Payer: Medicare Other | Admitting: Neurology

## 2022-04-15 VITALS — BP 135/86 | HR 67 | Ht 61.0 in | Wt 145.4 lb

## 2022-04-15 DIAGNOSIS — G20 Parkinson's disease: Secondary | ICD-10-CM | POA: Diagnosis not present

## 2022-04-15 DIAGNOSIS — G2581 Restless legs syndrome: Secondary | ICD-10-CM | POA: Diagnosis not present

## 2022-04-15 MED ORDER — TRAZODONE HCL 50 MG PO TABS: ORAL_TABLET | ORAL | 1 refills | Status: DC

## 2022-04-15 MED ORDER — CARBIDOPA-LEVODOPA 25-100 MG PO TABS: 2.0000 | ORAL_TABLET | Freq: Three times a day (TID) | ORAL | 1 refills | Status: DC

## 2022-04-15 MED ORDER — CARBIDOPA-LEVODOPA ER 50-200 MG PO TBCR: 1.0000 | EXTENDED_RELEASE_TABLET | Freq: Every day | ORAL | 1 refills | Status: DC

## 2022-04-15 NOTE — Patient Instructions (Signed)
Continue carbidopa/levodopa 25/100, 2  tablet 3 times per day ADD carbidopa/levodopa 50/200 at bedtime  Local and Online Resources for Power over Parkinson's Group September 2023  LOCAL [place] PARKINSON'S GROUPS  Power over Parkinson's Group:   Power Over Parkinson's Patient Education Group will be Wednesday, September 13th-*Hybrid meting*- in person at [facility] location and via [facility] at 2 pm.   Upcoming Power over [employer] Meetings:  2nd Wednesdays of the month at 2 pm:  September 13th, October 11th, November 8th Contact Amy Marriott at [email or web address] if interested in participating in this group Parkinson's Care Partners Group:    3rd Mondays, Contact Misty Paladino Atypical Parkinsonian Patient Group:   4th Wednesdays, [place] If you are interested in participating in these groups with Misty, please contact her directly for how to join those meetings.  Her contact information is [email or web address].    LOCAL EVENTS AND NEW OFFERINGS New PWR! Moves Dynegy Instructor-Led Classes offering at [employer]!  Wednesdays 1-2 pm.   Contact Vonna Kotyk at  [email or web address] or Caron Presume at [place], [email or web address] Dance for Parkinson 's classes will be on Tuesdays 9:30am-10:30am starting October 3-December 12 with a break the week of November 21 . Located in the December 04 which is in the first floor of the [employer]  ([place] for Parkinson's will be held on 2nd and 4th Mondays at 11:00am . First class will start  September 25th.  Located at the [place] ([place].) Through support from the [place] and Drumming for Parkinson's classes are free for both patients and caregivers.  Contact Misty Taylor-Paladino for more details about registering.  [place]:  www.[email or web address] PD Health at Home continues:  Mindfulness Mondays, Wellness Wednesdays, Fitness Fridays  Upcoming Education:   Navigating Nutrition with PD.  Wednesday, Sept. 6th 1:00-2:00 pm Understanding Mind and Memory.  Wednesday, Sept. 20th 1:00-2:00 pm  Expert Briefing:    Parkinson's Disease and the Bladder.  Wednesday, Sept. 13th 1:00-2:00 pm Parkinson's and the Gut-Brain Connection.  Wednesday, Oct. 11th 1:00-2:00 pm Register for expert briefings (webinars) at 01-19-1976 Please check out their website to sign up for emails and see their full online offerings   [place]:  [email or web address]  Third Thursday Webinars:  On the third Thursday of every month at 12 p.m. ET, join our free live webinars to learn about various aspects of living with Parkinson's disease and our work to speed medical breakthroughs. Upcoming Webinar:  Stay tuned Check out additional information on their website to see their full online offerings  Friday:  [email or web address] Upcoming Webinar:   Stay tuned Webinar Series:  Living with Parkinson's Meetup.   Third Thursdays each month, 3 pm Care Partner Monthly Meetup.  With 09-18-1985 [place].  First Tuesday of each month, 2 pm Check out additional information to Live Well Today on their website  Parkinson and Movement Disorders (PMD) Alliance:  www.[email or web address] [employer] Online:  Online Education Events Sign up for emails, which are sent weekly to give you updates on programming and online offerings  Parkinson's Association of the [place]:  www.[email or web address] Information on online support groups, education events, and online exercises including Yoga, Parkinson's exercises and more-LOTS of information on links to PD resources and online events Virtual Support Group through Parkinson's  Association of the [place]; next one is scheduled for Wednesday, October 4th at 2 pm. (  No September meeting due to the symposium.  These are typically scheduled for the 1st Wednesday of the month at 2 pm).  Visit website for details. Register for "Caring for Parkinson's-Caring for You", 9th Annual Symposium.  In-person event in [place].  September 9th.  To register:  [email or web address]/symposium-[email or web address]_aid=[email or web address] MOVEMENT AND EXERCISE OPPORTUNITIES PWR! Moves Classes at [place].  Wednesdays 10 and 11 am.   Contact Amy Marriott, PT [email or web address] if interested. NEW PWR! Moves Class offerings at [employer].  Wednesdays 1-2 pm.  Contact Vonna Kotyk at  [email or web address] or Caron Presume at [place],  [email or web address] Parkinson's Wellness Recovery (PWR! Moves)  www.[email or web address] Info on the PWR! Virtual Experience:  You will have access to our expertise through self-assessment, guided plans that start with the PD-specific fundamentals, educational content, tips, Q&A with an expert, and a growing Art therapist of PD-specific pre-recorded and live exercise classes of varying types and intensity - both physical and cognitive! If that is not enough, we offer 1:1 wellness consultations (in-person or virtual) to personalize your PWR! Research scientist (medical).  [place] Fridays:  As part of the PD Health @ Home program, this free video series focuses each week on one aspect of fitness designed to support people living with Parkinson's.  These weekly videos highlight the Schneider recent fitness guidelines for people with Parkinson's disease. [employer].si Dance for PD website is offering free, live-stream classes throughout the week, as well as links to [employer] of classes:  [email or web address]/ Virtual dance and Pilates for Parkinson's classes:  Click on the Community Tab> Parkinson's Movement Initiative Tab.  To register for classes and for more information, visit [email or web address] and click the "community" tab.  [employer] Parkinson's Cycling Classes  [employer]:  Thursdays @ Noon-Live classes at [employer] ([employer] at [email or web address] or [phone]) [employer]: Virtual Classes Mondays and Thursdays Jeanette Caprice classes Tuesday, Wednesday and Thursday (contact [place] at [email or web address]  or [phone]) [place] Varied levels of classes are offered Tuesdays and Thursdays at [facility].  Stretching with Verdis Frederickson weekly class is also offered for people with Parkinson's To observe a class or for more information, call [phone] or email Hezzie Bump at [email or web address] ADDITIONAL SUPPORT AND RESOURCES Well[employer]Spring [employer]:Online Caregiver Education Opportunities:  www.well-springsolutions.org/caregiver-education/caregiver-support-group.  You may also contact Vickki Muff at [email or web address] or [phone].    Coping with Difficult Caregiver Emotions.  Wednesday, September 20th, 10:30 am-12.  [facility], [facility] Navigating the Maze of Senior Care Options.  Thursday, September 28th, 4-5:15 pm.  [facility]. Well-Spring Navigator:  [facility] program, a free service to help individuals and families through the journey of determining care for older adults.  The "Navigator" is a [employer], Education officer, museum, who will speak with a prospective client and/or loved ones to provide an assessment of the situation and a set of recommendations for a personalized care plan -- all free of charge, and whether Well-Spring [employer] offers the needed service or not. If the need is not a service we provide, we are well-connected with reputable programs in town that we can refer you to.  www.well-springsolutions.org or  to speak with the Navigator, call [phone].

## 2022-04-17 ENCOUNTER — Ambulatory Visit: Payer: Medicare Other | Admitting: Family Medicine

## 2022-04-21 ENCOUNTER — Other Ambulatory Visit: Payer: Self-pay | Admitting: Family Medicine

## 2022-04-23 NOTE — Telephone Encounter (Signed)
LVM for pt to CB and schedule Medicare Annual Wellness Visit (AWV).   ?

## 2022-04-25 ENCOUNTER — Telehealth: Payer: Self-pay | Admitting: Family Medicine

## 2022-04-25 NOTE — Telephone Encounter (Signed)
Copied from [place]. Topic: Medicare AWV >> Apr 25, 2022  1:35 PM Devoria Glassing wrote: Reason for CRM: Called patient to schedule Annual Wellness Visit.  Please schedule with Nurse Health Advisor Charlott Rakes, RN at [facility]. This appt can be telephone or office visit. Please call [phone] ask for [facility]

## 2022-04-29 DIAGNOSIS — B351 Tinea unguium: Secondary | ICD-10-CM | POA: Diagnosis not present

## 2022-04-29 DIAGNOSIS — M79676 Pain in unspecified toe(s): Secondary | ICD-10-CM | POA: Diagnosis not present

## 2022-04-29 DIAGNOSIS — L84 Corns and callosities: Secondary | ICD-10-CM | POA: Diagnosis not present

## 2022-05-05 ENCOUNTER — Telehealth: Payer: Self-pay | Admitting: Family Medicine

## 2022-05-05 NOTE — Telephone Encounter (Signed)
Left message for patient to schedule Annual Wellness Visit.  Please schedule (telephone/video call) with Nurse Health Advisor Tina Betterson, RN at [facility]. Please call [phone] ask for Kathy 

## 2022-05-30 ENCOUNTER — Ambulatory Visit: Payer: Medicare Other | Admitting: Family Medicine

## 2022-05-30 NOTE — Progress Notes (Deleted)
OFFICE VISIT  05/30/2022  CC: No chief complaint on file.   HPI:    Patient is a 81 y.o. female who presents for ***  INTERIM HX: ***  Past Medical History:  Diagnosis Date   Abnormal mammogram of left breast    Likely benign microcalcifications--repeat L diag mammo 03/24/2017.  COMPLEX SCLEROSING LESION WITH CALCIFICATIONS --no sign of malignancy.  Excision recommended--pt has been referred to Dr. Donne Hazel, who recommended f/u mammo and this was normal 12/2017--consider rpt 1 yr per rad.   Arthritis    DJD, back and both hips   Chronic low back pain without sciatica    Lumbar spondylosis + scoliosis.  Summer 2017 Dr. Nelva Bush did ESI and pt got no relief.  Pt then saw Dr. Maia Petties [facility]: felt she had facet mediated pain; plan for B L345 MBB (??).   Cystocele, midline    Diverticulosis    on colonoscopy 2004   GERD (gastroesophageal reflux disease)    Hx of cardiovascular stress test    Lexiscan Myoview (2/14):  Low risk, small ant defect likely breast attenuation, no ischemia, EF 69% (no change from 2010).     Hyperlipidemia    Hypertension    per pt   Idiopathic scoliosis of thoracolumbar region    Lumbar back pain    Mitral regurgitation    Echo (2/14):  EF 60-65%, Gr 1 DD, mild AI, mild bileaflet MVP, mod post directed MR, mild LAE, PASP 35.   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  Responding to gabapentin (Dr. Brett Fairy).   Osteoporosis, senile    stable left hip 2015 (took fosamax x 45yr, T-score -2.7 when she stopped bisphosphonate).  Rpt DEXA 255moater (09/2016) T-score -.3.1 (different machine, though). 11/2019 DEXA T-score -4. Recommended restart of fosamax   PAF (paroxysmal atrial fibrillation) (HCC)    Dr. JoMartiniqueFlecainide, metopr, apxiaban.  08/2017-->persistent a-fib w/RVR, flecainide increased and she converted back to NSR and felt better.  '100mg'$  bid flecainide  continued.   Parkinson's disease ([facility]   started sinemet 05/2019 (Dr. TaCarles Collet  Peripheral neuropathy 2008   Idiopathic vs familial (motor>sensory) vs lumbar disc dz: gabapentin helpful   Shoulder pain left   RC surg 09/2014    Past Surgical History:  Procedure Laterality Date   BREAST BIOPSY  04/10/2017   Left breast core needle biopsy of calcifications.  COMPLEX SCLEROSING LESION WITH CALCIFICATIONS --no sign of malignancy.  Excision recommended--pt has been referred to Dr. WaDonne Hazelwho recommended rpt mammo and this was NORMAL 12/2017.   BREAST EXCISIONAL BIOPSY Right 2010   Benign   BREAST SURGERY  Nov 2010   Benign biopsy, right   CHOLECYSTECTOMY  03/2010   Dr.Gross   COLONOSCOPY  10/03/2002   No polyps.  Repeat 10 yrs recommended but pt declines.  Pt declined cologuard 08/2016 but changed her mind and this test was NEG on 03/23/17.   COMBINED HYSTERECTOMY VAGINAL / OOPHORECTOMY / A&P REPAIR  196045 uncertain if ovaries were removed or not   CYSTOCELE REPAIR     DEXA  11/2019   T score -4   Lumpectomy  06/2009   "Fatty Necrosis"   PFT's  2014   NORMAL   REVERSE SHOULDER ARTHROPLASTY Right 03/11/2019   Procedure: REVERSE TOTAL SHOULDER ARTHROPLASTY;  Surgeon: NoNetta CedarsMD;  Location: [facility];  Service: Orthopedics;  Laterality: Right;  Intersclene block  ROTATOR CUFF REPAIR Left 10/02/14   Dr. Veverly Fells   TONSILLECTOMY     TOTAL KNEE ARTHROPLASTY  09/2009   Right ;Dr Alvan Dame   TOTAL KNEE ARTHROPLASTY Left 03/10/2016   Procedure: TOTAL KNEE ARTHROPLASTY;  Surgeon: Paralee Cancel, MD;  Location: [facility];  Service: Orthopedics;  Laterality: Left;   TRANSTHORACIC ECHOCARDIOGRAM  09/2012; 09/27/15   2014: EF 60-65%, Gr 1 DD, mild AI, mild bileaflet MVP, mod post directed MR, mild LAE, PASP 35.  Repeat 2017: EF 55-60%, mild AR, mild MVP and mild MV regurg.   VAGINAL HYSTERECTOMY     For Uterine Deviation     Outpatient Medications Prior to Visit  Medication Sig Dispense Refill    apixaban (ELIQUIS) 5 MG TABS tablet Take 1 tablet (5 mg total) by mouth 2 (two) times daily. 180 tablet 3   betamethasone dipropionate 0.05 % lotion SMARTSIG:1 sparingly Topical Daily     Biotin 5000 MCG CAPS Take 5,000 mcg by mouth daily.     Calcium Carbonate (CALCIUM 600 PO) Take 600 mg by mouth 2 (two) times daily.      carbidopa-levodopa (SINEMET CR) 50-200 MG tablet Take 1 tablet by mouth at bedtime. 90 tablet 1   carbidopa-levodopa (SINEMET IR) 25-100 MG tablet Take 2 tablets by mouth 3 (three) times daily. 540 tablet 1   cyanocobalamin (,VITAMIN B-12,) 1000 MCG/ML injection INJECT 1 ML (1,000 MCG TOTAL) INTO THE MUSCLE EVERY 30 DAYS. 3 mL 3   flecainide (TAMBOCOR) 100 MG tablet Take 1 tablet (100 mg total) by mouth 2 (two) times daily. Please keep upcoming appointment with Dr Martinique for any further refills. 180 tablet 3   fluticasone (FLONASE) 50 MCG/ACT nasal spray Place 2 sprays into both nostrils daily. 16 g 6   gabapentin (NEURONTIN) 300 MG capsule TAKE 2 CAPSULES BY MOUTH TWICE A DAY 360 capsule 1   metoprolol tartrate (LOPRESSOR) 25 MG tablet TAKE 1 TABLET BY MOUTH TWICE A DAY 180 tablet 1   nitroGLYCERIN (NITROSTAT) 0.4 MG SL tablet Place 1 tablet (0.4 mg total) under the tongue every 5 (five) minutes as needed for chest pain. 15 tablet 1   Omega-3 Fatty Acids (FISH OIL) 1200 MG CAPS Take 1,000 mg by mouth 2 (two) times daily.      pravastatin (PRAVACHOL) 40 MG tablet Take 1 tablet (40 mg total) by mouth daily. 90 tablet 3   raloxifene (EVISTA) 60 MG tablet TAKE 1 TABLET BY MOUTH EVERY DAY 90 tablet 1   traZODone (DESYREL) 50 MG tablet TAKE 1 TABLET BY MOUTH EVERYDAY AT BEDTIME 90 tablet 1   cefdinir (OMNICEF) 300 MG capsule Take 1 capsule (300 mg total) by mouth 2 (two) times daily. 10 capsule 0   No facility-administered medications prior to visit.    Allergies  Allergen Reactions   Sulfa Antibiotics Swelling    face   Tizanidine Other (See Comments)    delirium     ROS As per HPI  PE:    04/15/2022   11:19 AM 03/04/2022   10:48 AM 02/28/2022   10:28 AM  Vitals with BMI  Height '5\' 1"'$  '5\' 1"'$  '5\' 1"'$   Weight 145 lbs 6 oz 147 lbs 10 oz 150 lbs 6 oz  BMI 27.49 16.1 09.60  Systolic 454 098 119  Diastolic 86 68 71  Pulse 67 58 46   Physical Exam  ***  LABS:  Last CBC Lab Results  Component Value Date   WBC 5.7 02/26/2021   HGB 13.4  02/26/2021   HCT 40.5 02/26/2021   MCV 93.7 02/26/2021   MCH 30.7 03/09/2019   RDW 13.5 02/26/2021   PLT 157.0 63/84/6659   Last metabolic panel Lab Results  Component Value Date   GLUCOSE 79 09/06/2021   NA 141 09/06/2021   K 4.3 09/06/2021   CL 105 09/06/2021   CO2 32 09/06/2021   BUN 19 09/06/2021   CREATININE 0.90 09/06/2021   GFRNONAA >60 03/12/2019   CALCIUM 9.9 09/06/2021   PROT 6.4 09/06/2021   ALBUMIN 4.1 09/06/2021   BILITOT 0.8 09/06/2021   ALKPHOS 62 09/06/2021   AST 18 09/06/2021   ALT 3 09/06/2021   ANIONGAP 6 03/12/2019   Last thyroid functions Lab Results  Component Value Date   TSH 1.32 04/21/2019  Last vitamin D Lab Results  Component Value Date   VD25OH 80.04 04/28/2014   IMPRESSION AND PLAN:  No problem-specific Assessment & Plan notes found for this encounter.   An After Visit Summary was printed and given to the patient.  FOLLOW UP: No follow-ups on file.  Signed:  Crissie Sickles, MD           05/30/2022

## 2022-06-02 ENCOUNTER — Encounter: Payer: Self-pay | Admitting: Family Medicine

## 2022-06-02 ENCOUNTER — Ambulatory Visit (INDEPENDENT_AMBULATORY_CARE_PROVIDER_SITE_OTHER): Payer: Medicare Other | Admitting: Family Medicine

## 2022-06-02 VITALS — BP 119/68 | HR 56 | Temp 98.4°F | Ht 61.0 in | Wt 147.6 lb

## 2022-06-02 DIAGNOSIS — L659 Nonscarring hair loss, unspecified: Secondary | ICD-10-CM

## 2022-06-02 DIAGNOSIS — Z23 Encounter for immunization: Secondary | ICD-10-CM | POA: Diagnosis not present

## 2022-06-02 NOTE — Progress Notes (Signed)
OFFICE VISIT  06/02/2022  CC:  Chief Complaint  Patient presents with   Hair/Scalp Problem    Pt c/o hair loss x7 months. No other concerns   Patient is a 81 y.o. female who presents accompanied by her daughter for hair loss.  HPI: Sparse hair loss for the last 6 to 7 months.  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  No new medications prior to onset. She has been under increased stress in the last year because she feels like she is a burden living with her daughter.  Additionally, her daughter has some health problems lately that have her worried. Cassie White otherwise feels well.    Past Medical History:  Diagnosis Date   Abnormal mammogram of left breast    Likely benign microcalcifications--repeat L diag mammo 03/24/2017.  COMPLEX SCLEROSING LESION WITH CALCIFICATIONS --no sign of malignancy.  Excision recommended--pt has been referred to Dr. Donne Hazel, who recommended f/u mammo and this was normal 12/2017--consider rpt 1 yr per rad.   Arthritis    DJD, back and both hips   Chronic low back pain without sciatica    Lumbar spondylosis + scoliosis.  Summer 2017 Dr. Nelva Bush did ESI and pt got no relief.  Pt then saw Dr. Maia Petties [facility]: felt she had facet mediated pain; plan for B L345 MBB (??).   Cystocele, midline    Diverticulosis    on colonoscopy 2004   GERD (gastroesophageal reflux disease)    Hx of cardiovascular stress test    Lexiscan Myoview (2/14):  Low risk, small ant defect likely breast attenuation, no ischemia, EF 69% (no change from 2010).     Hyperlipidemia    Hypertension    per pt   Idiopathic scoliosis of thoracolumbar region    Lumbar back pain    Mitral regurgitation    Echo (2/14):  EF 60-65%, Gr 1 DD, mild AI, mild bileaflet MVP, mod post directed MR, mild LAE, PASP 35.   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  Responding to gabapentin (Dr.  Brett Fairy).   Osteoporosis, senile    stable left hip 2015 (took fosamax x 37yr, T-score -2.7 when she stopped bisphosphonate).  Rpt DEXA 233moater (09/2016) T-score -.3.1 (different machine, though). 11/2019 DEXA T-score -4. Recommended restart of fosamax   PAF (paroxysmal atrial fibrillation) (HCC)    Dr. JoMartiniqueFlecainide, metopr, apxiaban.  08/2017-->persistent a-fib w/RVR, flecainide increased and she converted back to NSR and felt better.  '100mg'$  bid flecainide continued.   Parkinson's disease 2020 dx   started sinemet 05/2019 (Dr. TaCarles Collet  Peripheral neuropathy 2008   Idiopathic vs familial (motor>sensory) vs lumbar disc dz: gabapentin helpful   Shoulder pain left   RC surg 09/2014    Past Surgical History:  Procedure Laterality Date   BREAST BIOPSY  04/10/2017   Left breast core needle biopsy of calcifications.  COMPLEX SCLEROSING LESION WITH CALCIFICATIONS --no sign of malignancy.  Excision recommended--pt has been referred to Dr. WaDonne Hazelwho recommended rpt mammo and this was NORMAL 12/2017.   BREAST EXCISIONAL BIOPSY Right 2010   Benign   BREAST SURGERY  Nov 2010   Benign biopsy, right   CHOLECYSTECTOMY  03/2010   Dr.Gross   COLONOSCOPY  10/03/2002   No polyps.  Repeat 10 yrs recommended but pt declines.  Pt declined cologuard 08/2016 but changed her mind and this test was NEG on 03/23/17.  COMBINED HYSTERECTOMY VAGINAL / OOPHORECTOMY / A&P REPAIR  0998   uncertain if ovaries were removed or not   CYSTOCELE REPAIR     DEXA  11/2019   T score -4   Lumpectomy  06/2009   "Fatty Necrosis"   PFT's  2014   NORMAL   REVERSE SHOULDER ARTHROPLASTY Right 03/11/2019   Procedure: REVERSE TOTAL SHOULDER ARTHROPLASTY;  Surgeon: Netta Cedars, MD;  Location: [facility];  Service: Orthopedics;  Laterality: Right;  Intersclene block   ROTATOR CUFF REPAIR Left 10/02/14   Dr. Veverly Fells   TONSILLECTOMY     TOTAL KNEE ARTHROPLASTY  09/2009   Right ;Dr Alvan Dame   TOTAL KNEE ARTHROPLASTY Left 03/10/2016    Procedure: TOTAL KNEE ARTHROPLASTY;  Surgeon: Paralee Cancel, MD;  Location: [facility];  Service: Orthopedics;  Laterality: Left;   TRANSTHORACIC ECHOCARDIOGRAM  09/2012; 09/27/15   2014: EF 60-65%, Gr 1 DD, mild AI, mild bileaflet MVP, mod post directed MR, mild LAE, PASP 35.  Repeat 2017: EF 55-60%, mild AR, mild MVP and mild MV regurg.   VAGINAL HYSTERECTOMY     For Uterine Deviation     Outpatient Medications Prior to Visit  Medication Sig Dispense Refill   apixaban (ELIQUIS) 5 MG TABS tablet Take 1 tablet (5 mg total) by mouth 2 (two) times daily. 180 tablet 3   betamethasone dipropionate 0.05 % lotion SMARTSIG:1 sparingly Topical Daily     Biotin 5000 MCG CAPS Take 5,000 mcg by mouth daily.     Calcium Carbonate (CALCIUM 600 PO) Take 600 mg by mouth 2 (two) times daily.      carbidopa-levodopa (SINEMET CR) 50-200 MG tablet Take 1 tablet by mouth at bedtime. 90 tablet 1   carbidopa-levodopa (SINEMET IR) 25-100 MG tablet Take 2 tablets by mouth 3 (three) times daily. 540 tablet 1   cyanocobalamin (,VITAMIN B-12,) 1000 MCG/ML injection INJECT 1 ML (1,000 MCG TOTAL) INTO THE MUSCLE EVERY 30 DAYS. 3 mL 3   flecainide (TAMBOCOR) 100 MG tablet Take 1 tablet (100 mg total) by mouth 2 (two) times daily. Please keep upcoming appointment with Dr Martinique for any further refills. 180 tablet 3   fluticasone (FLONASE) 50 MCG/ACT nasal spray Place 2 sprays into both nostrils daily. 16 g 6   gabapentin (NEURONTIN) 300 MG capsule TAKE 2 CAPSULES BY MOUTH TWICE A DAY 360 capsule 1   metoprolol tartrate (LOPRESSOR) 25 MG tablet TAKE 1 TABLET BY MOUTH TWICE A DAY 180 tablet 1   nitroGLYCERIN (NITROSTAT) 0.4 MG SL tablet Place 1 tablet (0.4 mg total) under the tongue every 5 (five) minutes as needed for chest pain. 15 tablet 1   Omega-3 Fatty Acids (FISH OIL) 1200 MG CAPS Take 1,000 mg by mouth 2 (two) times daily.      pravastatin (PRAVACHOL) 40 MG tablet Take 1 tablet (40 mg total) by mouth daily. 90 tablet 3    raloxifene (EVISTA) 60 MG tablet TAKE 1 TABLET BY MOUTH EVERY DAY 90 tablet 1   traZODone (DESYREL) 50 MG tablet TAKE 1 TABLET BY MOUTH EVERYDAY AT BEDTIME 90 tablet 1   No facility-administered medications prior to visit.    Allergies  Allergen Reactions   Sulfa Antibiotics Swelling    face   Tizanidine Other (See Comments)    delirium    ROS As per HPI  PE:    06/02/2022    2:09 PM 04/15/2022   11:19 AM 03/04/2022   10:48 AM  Vitals with BMI  Height 5'  1" '5\' 1"'$  '5\' 1"'$   Weight 147 lbs 10 oz 145 lbs 6 oz 147 lbs 10 oz  BMI 27.9 88.11 03.1  Systolic 594 585 929  Diastolic 68 86 68  Pulse 56 67 58     Physical Exam  Gen: Alert, well appearing.  Patient is oriented to person, place, time, and situation. AFFECT: pleasant, lucid thought and speech. Scalp with sparse diffuse hair thinning.  LABS:  Last CBC Lab Results  Component Value Date   WBC 5.7 02/26/2021   HGB 13.4 02/26/2021   HCT 40.5 02/26/2021   MCV 93.7 02/26/2021   MCH 30.7 03/09/2019   RDW 13.5 02/26/2021   PLT 157.0 24/46/2863   Last metabolic panel Lab Results  Component Value Date   GLUCOSE 79 09/06/2021   NA 141 09/06/2021   K 4.3 09/06/2021   CL 105 09/06/2021   CO2 32 09/06/2021   BUN 19 09/06/2021   CREATININE 0.90 09/06/2021   GFRNONAA >60 03/12/2019   CALCIUM 9.9 09/06/2021   PROT 6.4 09/06/2021   ALBUMIN 4.1 09/06/2021   BILITOT 0.8 09/06/2021   ALKPHOS 62 09/06/2021   AST 18 09/06/2021   ALT 3 09/06/2021   ANIONGAP 6 03/12/2019   Last hemoglobin A1c Lab Results  Component Value Date   HGBA1C  09/19/2010    5.3 (NOTE)                                                                       According to the ADA Clinical Practice Recommendations for 2011, when HbA1c is used as a screening test:   >=6.5%   Diagnostic of Diabetes Mellitus           (if abnormal result  is confirmed)  5.7-6.4%   Increased risk of developing Diabetes Mellitus  References:Diagnosis and Classification of  Diabetes Mellitus,Diabetes OTRR,1165,79(UXYBF 1):S62-S69 and Standards of Medical Care in         Diabetes - 2011,Diabetes Care,2011,34  (Suppl 1):S11-S61.   Last thyroid functions Lab Results  Component Value Date   TSH 1.32 04/21/2019   IMPRESSION AND PLAN:  Alopecia. 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 Unlikely to be medications. Rule out hypothyroidism, TSH today.  Of note, we did discuss possible effect of her biotin on this lab result. Reassured.  An After Visit Summary was printed and given to the patient.  FOLLOW UP: No follow-ups on file.  Signed:  Crissie Sickles, MD           06/02/2022

## 2022-06-03 LAB — TSH: TSH: 0.8 u[IU]/mL (ref 0.35–5.50)

## 2022-06-11 ENCOUNTER — Ambulatory Visit: Payer: Medicare Other | Admitting: Family Medicine

## 2022-07-16 ENCOUNTER — Ambulatory Visit (INDEPENDENT_AMBULATORY_CARE_PROVIDER_SITE_OTHER): Payer: Medicare Other

## 2022-07-16 DIAGNOSIS — Z0000 Encounter for general adult medical examination without abnormal findings: Secondary | ICD-10-CM | POA: Diagnosis not present

## 2022-07-16 NOTE — Progress Notes (Signed)
Subjective:   Cassie White is a 81 y.o. female who presents for Medicare Annual (Subsequent) preventive examination.  I connected with  Carlye A Koury on 07/16/22 by an audio only telemedicine application and verified that I am speaking with the correct person using two identifiers.   I discussed the limitations, risks, security and privacy concerns of performing an evaluation and management service by telephone and the availability of in person appointments. I also discussed with the patient that there may be a patient responsible charge related to this service. The patient expressed understanding and verbally consented to this telephonic visit.  Location of Patient: home Location of Provider:office  List any persons and their role that are participating in the visit with the patient.   [place], CMA  Review of Systems    Defer to PCP Cardiac Risk Factors include: advanced age (>40mn, >>5women)     Objective:    There were no vitals filed for this visit. There is no height or weight on file to calculate BMI.     07/16/2022    3:27 PM 04/15/2022   11:22 AM 03/14/2022   12:09 PM 10/17/2021   11:12 AM 10/10/2021   11:14 AM 04/27/2021    1:38 PM 04/25/2021   10:16 AM  Advanced Directives  Does Patient Have a Medical Advance Directive? No Yes No No No Yes;No Yes  Type of Advance Directive       Living will  Would patient like information on creating a medical advance directive? No - Patient declined     Yes (MAU/Ambulatory/Procedural Areas - Information given)     Current Medications (verified) Outpatient Encounter Medications as of 07/16/2022  Medication Sig   apixaban (ELIQUIS) 5 MG TABS tablet Take 1 tablet (5 mg total) by mouth 2 (two) times daily.   betamethasone dipropionate 0.05 % lotion SMARTSIG:1 sparingly Topical Daily   Biotin 5000 MCG CAPS Take 5,000 mcg by mouth daily.   Calcium Carbonate (CALCIUM 600 PO) Take 600 mg by mouth 2 (two) times  daily.    carbidopa-levodopa (SINEMET CR) 50-200 MG tablet Take 1 tablet by mouth at bedtime.   carbidopa-levodopa (SINEMET IR) 25-100 MG tablet Take 2 tablets by mouth 3 (three) times daily.   cyanocobalamin (,VITAMIN B-12,) 1000 MCG/ML injection INJECT 1 ML (1,000 MCG TOTAL) INTO THE MUSCLE EVERY 30 DAYS.   flecainide (TAMBOCOR) 100 MG tablet Take 1 tablet (100 mg total) by mouth 2 (two) times daily. Please keep upcoming appointment with Dr JMartiniquefor any further refills.   fluticasone (FLONASE) 50 MCG/ACT nasal spray Place 2 sprays into both nostrils daily.   gabapentin (NEURONTIN) 300 MG capsule TAKE 2 CAPSULES BY MOUTH TWICE A DAY   metoprolol tartrate (LOPRESSOR) 25 MG tablet TAKE 1 TABLET BY MOUTH TWICE A DAY   nitroGLYCERIN (NITROSTAT) 0.4 MG SL tablet Place 1 tablet (0.4 mg total) under the tongue every 5 (five) minutes as needed for chest pain.   Omega-3 Fatty Acids (FISH OIL) 1200 MG CAPS Take 1,000 mg by mouth 2 (two) times daily.    pravastatin (PRAVACHOL) 40 MG tablet Take 1 tablet (40 mg total) by mouth daily.   raloxifene (EVISTA) 60 MG tablet TAKE 1 TABLET BY MOUTH EVERY DAY   traZODone (DESYREL) 50 MG tablet TAKE 1 TABLET BY MOUTH EVERYDAY AT BEDTIME   No facility-administered encounter medications on file as of 07/16/2022.    Allergies (verified) Sulfa antibiotics and Tizanidine   History: Past Medical  History:  Diagnosis Date   Abnormal mammogram of left breast    Likely benign microcalcifications--repeat L diag mammo 03/24/2017.  COMPLEX SCLEROSING LESION WITH CALCIFICATIONS --no sign of malignancy.  Excision recommended--pt has been referred to Dr. Donne Hazel, who recommended f/u mammo and this was normal 12/2017--consider rpt 1 yr per rad.   Arthritis    DJD, back and both hips   Chronic low back pain without sciatica    Lumbar spondylosis + scoliosis.  Summer 2017 Dr. Nelva Bush did ESI and pt got no relief.  Pt then saw Dr. Maia Petties [facility]: felt she had  facet mediated pain; plan for B L345 MBB (??).   Cystocele, midline    Diverticulosis    on colonoscopy 2004   GERD (gastroesophageal reflux disease)    Hx of cardiovascular stress test    Lexiscan Myoview (2/14):  Low risk, small ant defect likely breast attenuation, no ischemia, EF 69% (no change from 2010).     Hyperlipidemia    Hypertension    per pt   Idiopathic scoliosis of thoracolumbar region    Lumbar back pain    Mitral regurgitation    Echo (2/14):  EF 60-65%, Gr 1 DD, mild AI, mild bileaflet MVP, mod post directed MR, mild LAE, PASP 35.   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  Responding to gabapentin (Dr. Brett Fairy).   Osteoporosis, senile    stable left hip 2015 (took fosamax x 5yr, T-score -2.7 when she stopped bisphosphonate).  Rpt DEXA 269moater (09/2016) T-score -.3.1 (different machine, though). 11/2019 DEXA T-score -4. Recommended restart of fosamax   PAF (paroxysmal atrial fibrillation) (HCC)    Dr. JoMartiniqueFlecainide, metopr, apxiaban.  08/2017-->persistent a-fib w/RVR, flecainide increased and she converted back to NSR and felt better.  '100mg'$  bid flecainide continued.   Parkinson's disease 2020 dx   started sinemet 05/2019 (Dr. TaCarles Collet  Peripheral neuropathy 2008   Idiopathic vs familial (motor>sensory) vs lumbar disc dz: gabapentin helpful   Shoulder pain left   RC surg 09/2014   Past Surgical History:  Procedure Laterality Date   BREAST BIOPSY  04/10/2017   Left breast core needle biopsy of calcifications.  COMPLEX SCLEROSING LESION WITH CALCIFICATIONS --no sign of malignancy.  Excision recommended--pt has been referred to Dr. WaDonne Hazelwho recommended rpt mammo and this was NORMAL 12/2017.   BREAST EXCISIONAL BIOPSY Right 2010   Benign   BREAST SURGERY  Nov 2010   Benign biopsy, right   CHOLECYSTECTOMY  03/2010   Dr.Gross   COLONOSCOPY  10/03/2002   No polyps.  Repeat 10  yrs recommended but pt declines.  Pt declined cologuard 08/2016 but changed her mind and this test was NEG on 03/23/17.   COMBINED HYSTERECTOMY VAGINAL / OOPHORECTOMY / A&P REPAIR  195916 uncertain if ovaries were removed or not   CYSTOCELE REPAIR     DEXA  11/2019   T score -4   Lumpectomy  06/2009   "Fatty Necrosis"   PFT's  2014   NORMAL   REVERSE SHOULDER ARTHROPLASTY Right 03/11/2019   Procedure: REVERSE TOTAL SHOULDER ARTHROPLASTY;  Surgeon: NoNetta CedarsMD;  Location: [facility];  Service: Orthopedics;  Laterality: Right;  Intersclene block   ROTATOR CUFF REPAIR Left 10/02/14   Dr. NoVeverly Fells TONSILLECTOMY     TOTAL KNEE ARTHROPLASTY  09/2009   Right ;Dr OlAlvan Dame TOTAL KNEE ARTHROPLASTY Left 03/10/2016  Procedure: TOTAL KNEE ARTHROPLASTY;  Surgeon: Paralee Cancel, MD;  Location: [facility];  Service: Orthopedics;  Laterality: Left;   TRANSTHORACIC ECHOCARDIOGRAM  09/2012; 09/27/15   2014: EF 60-65%, Gr 1 DD, mild AI, mild bileaflet MVP, mod post directed MR, mild LAE, PASP 35.  Repeat 2017: EF 55-60%, mild AR, mild MVP and mild MV regurg.   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   [place]ig from [place]   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   Caffiene, 1 cup daily avg.   No tob, no alc, no drugs.   Exercise: walking, limited by bilat hip pain.   Social Determinants of Health   Financial Resource Strain: Low Risk  (07/16/2022)   Overall Financial Resource Strain (CARDIA)    Difficulty of Paying Living Expenses: Not hard at all  Food Insecurity: No Food Insecurity (07/16/2022)   Hunger Vital Sign    Worried About Running Out of Food in the Last Year: Never true    Ran Out of Food in the Last Year: Never true  Transportation Needs: No Transportation Needs (07/16/2022)   PRAPARE - Hydrologist (Medical): No    Lack of Transportation (Non-Medical): No  Physical Activity: Inactive (07/16/2022)   Exercise Vital Sign    Days of Exercise per Week: 0 days    Minutes of Exercise per Session: 0 min  Stress: No Stress Concern Present (07/16/2022)   [place]    Feeling of Stress : Not at all  Social Connections: Moderately Integrated (07/16/2022)   Social Connection and Isolation Panel [NHANES]    Frequency of Communication with Friends and Family: More than three times a week    Frequency of Social Gatherings with Friends and Family: More than three times a week    Attends Religious Services: More than 4 times per year    Active Member of [employer] or Organizations: Yes    Attends Archivist Meetings: More than 4 times per year    Marital Status: Widowed    Tobacco Counseling Counseling given: Not Answered   Clinical Intake:  Pre-visit preparation completed: No  Pain : No/denies pain     Nutritional Risks:  None Diabetes: No     Diabetic?no         Activities of Daily  Living    07/16/2022    3:28 PM  In your present state of health, do you have any difficulty performing the following activities:  Hearing? 0  Vision? 0  Difficulty concentrating or making decisions? 0  Walking or climbing stairs? 0  Dressing or bathing? 0  Doing errands, shopping? 0  Preparing Food and eating ? N  Using the Toilet? N  In the past six months, have you accidently leaked urine? N  Do you have problems with loss of bowel control? N  Managing your Medications? N  Managing your Finances? N  Housekeeping or managing your Housekeeping? N    Patient Care Team: Tammi Sou, MD as PCP - General (Family Medicine) [place]ique, Peter M, MD as PCP - Cardiology (Cardiology) [place], Peter M, MD as Consulting Physician (Cardiology) Dohmeier, Asencion Partridge, MD as Consulting Physician (Neurology) Suella Broad, MD as Consulting Physician (Physical Medicine and Rehabilitation) Zonia Kief, MD as Consulting Physician (Rehabilitation) Starling Manns, MD (Orthopedic Surgery) Ward Givens, NP as Consulting Physician (Gerontology) Tat, Eustace Quail, DO as Consulting Physician (Neurology) Elesa Massed, NP (Inactive) as Nurse Practitioner (Obstetrics and Gynecology) Tona Sensing, FNP (Nurse Practitioner)  Indicate any recent Medical Services you may have received from other than [facility] providers in the past year (date may be approximate).     Assessment:   This is a routine wellness examination for Lakenzie.  Hearing/Vision screen No results found.  Dietary issues and exercise activities discussed: Current Exercise Habits: The patient does not participate in regular exercise at present   Goals Addressed             This Visit's Progress    Quality of Life Maintained       Evidence-based guidance:  Assess patient's thoughts about quality of life, goals and expectations, and dissatisfaction or desire to improve.  Identify issues of primary importance such as mental health,  illness, exercise tolerance, pain, sexual function and intimacy, cognitive change, social isolation, finances and relationships.  Assess and monitor for signs/symptoms of psychosocial concerns, especially depression or ideations regarding harm to others or self; provide or refer for mental health services as needed.  Identify sensory issues that impact quality of life such as hearing loss, vision deficit; strategize ways to maintain or improve hearing, vision.  Promote access to services in the community to support independence such as support groups, home visiting programs, financial assistance, handicapped parking tags, durable medical equipment and emergency responder.  Promote activities to decrease social isolation such as group support or social, leisure and recreational activities, employment, use of social media; consider safety concerns about being out of home for activities.  Provide patient an opportunity to share by storytelling or a "life review" to give positive meaning to life and to assist with coping and negative experiences.  Encourage patient to tap into hope to improve sense of self.  Counsel based on prognosis and as early as possible about end-of-life and palliative care; consider referral to palliative care provider.  Advocate for the development of palliative care plan that may include avoidance of unnecessary testing and intervention, symptom control, discontinuation of medications, hospice and organ donation.  Counsel as early as possible those with life-limiting chronic disease about palliative care; consider referral to palliative care provider.  Advocate for the development of palliative care plan.   Notes:       Depression  Screen    07/16/2022    3:28 PM 07/16/2022    3:25 PM 06/02/2022    2:09 PM 09/06/2021   10:35 AM 04/27/2021    1:39 PM 02/26/2021   10:21 AM 03/26/2018    9:01 AM  PHQ 2/9 Scores  PHQ - 2 Score 0 0 0 0 0 0 0    Fall Risk    07/16/2022     3:27 PM 06/02/2022    2:09 PM 04/15/2022   11:22 AM 10/10/2021   11:14 AM 09/06/2021   10:35 AM  Fall Risk   Falls in the past year? '1 1 1 1 1  '$ Number falls in past yr: '1 1 1 1 1  '$ Injury with Fall? 0 0 0 0 0  Risk for fall due to : History of fall(s)    Impaired balance/gait;Impaired vision;Impaired mobility  Follow up Falls evaluation completed    Falls evaluation completed    FALL RISK PREVENTION PERTAINING TO THE HOME:  Any stairs in or around the home? Yes  If so, are there any without handrails? Yes  Home free of loose throw rugs in walkways, pet beds, electrical cords, etc? Yes  Adequate lighting in your home to reduce risk of falls? Yes   ASSISTIVE DEVICES UTILIZED TO PREVENT FALLS:  Life alert? No  Use of a cane, walker or w/c? No  Grab bars in the bathroom? Yes  Shower chair or bench in shower? Yes  Elevated toilet seat or a handicapped toilet? Yes   TIMED UP AND GO:  Was the test performed? No .  Length of time to ambulate 10 feet: n/a sec.     Cognitive Function:    08/08/2016   10:21 AM  MMSE - Mini Mental State Exam  Orientation to time 5  Orientation to Place 5  Registration 3  Attention/ Calculation 5  Recall 2  Language- name 2 objects 2  Language- repeat 1  Language- follow 3 step command 3  Language- read & follow direction 1  Write a sentence 1  Copy design 1  Total score 29        07/16/2022    3:28 PM 04/27/2021    1:41 PM  6CIT Screen  What Year? 0 points 0 points  What month? 0 points 0 points  What time? 0 points 0 points  Count back from 20 0 points 0 points  Months in reverse 0 points 0 points  Repeat phrase 0 points 0 points  Total Score 0 points 0 points    Immunizations Immunization History  Administered Date(s) Administered   Fluad Quad(high Dose 65+) 04/26/2019, 05/10/2021, 06/02/2022   Influenza Whole 05/04/2012   Influenza, High Dose Seasonal PF 05/12/2013, 04/28/2014, 05/28/2015, 04/28/2016, 05/18/2017, 05/12/2018    Moderna Sars-Covid-2 Vaccination 08/27/2019, 09/24/2019, 07/17/2020   Pneumococcal Conjugate-13 12/25/2014   Pneumococcal Polysaccharide-23 09/07/2012   Td 02/14/2010   Zoster Recombinat (Shingrix) 02/28/2021, 07/11/2021    TDAP status: Due, Education has been provided regarding the importance of this vaccine. Advised may receive this vaccine at local pharmacy or Health Dept. Aware to provide a copy of the vaccination record if obtained from local pharmacy or Health Dept. Verbalized acceptance and understanding.  Flu Vaccine status: Up to date  Pneumococcal vaccine status: Up to date  Covid-19 vaccine status: Completed vaccines  Qualifies for Shingles Vaccine? Yes   Zostavax completed No   Shingrix Completed?: Yes  Screening Tests Health Maintenance  Topic Date Due   DTaP/Tdap/Td (2 -  Tdap) 02/15/2020   COVID-19 Vaccine (4 - 2023-24 season) 04/04/2022   Medicare Annual Wellness (AWV)  07/17/2023   Pneumonia Vaccine 66+ Years old  Completed   INFLUENZA VACCINE  Completed   DEXA SCAN  Completed   Zoster Vaccines- Shingrix  Completed   HPV VACCINES  Aged Out   COLONOSCOPY (Pts 45-12yr Insurance coverage will need to be confirmed)  DWildroseMaintenance Due  Topic Date Due   DTaP/Tdap/Td (2 - Tdap) 02/15/2020   COVID-19 Vaccine (4 - 2023-24 season) 04/04/2022    Colorectal cancer screening: No longer required.   Mammogram status: No longer required due to age.  Bone Density status: Completed 11/11/2019. Results reflect: Bone density results: OSTEOPOROSIS. Repeat every 2 years.  Lung Cancer Screening: (Low Dose CT Chest recommended if Age 81-80years, 30 pack-year currently smoking OR have quit w/in 15years.) does not qualify.   Lung Cancer Screening Referral: n/a  Additional Screening:  Hepatitis C Screening: does not qualify; Completed n/a  Vision Screening: Recommended annual ophthalmology exams for early detection of glaucoma and  other disorders of the eye. Is the patient up to date with their annual eye exam?  No  Who is the provider or what is the name of the office in which the patient attends annual eye exams? N/A If pt is not established with a provider, would they like to be referred to a provider to establish care? No .   Dental Screening: Recommended annual dental exams for proper oral hygiene  Community Resource Referral / Chronic Care Management: CRR required this visit?  No   CCM required this visit?  No      Plan:     I have personally reviewed and noted the following in the patient's chart:   Medical and social history Use of alcohol, tobacco or illicit drugs  Current medications and supplements including opioid prescriptions. Patient is not currently taking opioid prescriptions. Functional ability and status Nutritional status Physical activity Advanced directives List of other physicians Hospitalizations, surgeries, and ER visits in previous 12 months Vitals Screenings to include cognitive, depression, and falls Referrals and appointments  In addition, I have reviewed and discussed with patient certain preventive protocols, quality metrics, and best practice recommendations. A written personalized care plan for preventive services as well as general preventive health recommendations were provided to patient.     VBeatrix Fetters CKiefer  07/16/2022   Nurse Notes: Non-Face to Face or Face to Face 8 minute visit Encounter   Ms. Vandall , Thank you for taking time to come for your Medicare Wellness Visit. I appreciate your ongoing commitment to your health goals. Please review the following plan we discussed and let me know if I can assist you in the future.   These are the goals we discussed:  Goals       <enter goal here> (pt-stated)      "to be able to walk better". Plans to receive injections in back then become more active.       Quality of Life Maintained      Evidence-based  guidance:  Assess patient's thoughts about quality of life, goals and expectations, and dissatisfaction or desire to improve.  Identify issues of primary importance such as mental health, illness, exercise tolerance, pain, sexual function and intimacy, cognitive change, social isolation, finances and relationships.  Assess and monitor for signs/symptoms of psychosocial concerns, especially depression or ideations regarding harm to others or self; provide  or refer for mental health services as needed.  Identify sensory issues that impact quality of life such as hearing loss, vision deficit; strategize ways to maintain or improve hearing, vision.  Promote access to services in the community to support independence such as support groups, home visiting programs, financial assistance, handicapped parking tags, durable medical equipment and emergency responder.  Promote activities to decrease social isolation such as group support or social, leisure and recreational activities, employment, use of social media; consider safety concerns about being out of home for activities.  Provide patient an opportunity to share by storytelling or a "life review" to give positive meaning to life and to assist with coping and negative experiences.  Encourage patient to tap into hope to improve sense of self.  Counsel based on prognosis and as early as possible about end-of-life and palliative care; consider referral to palliative care provider.  Advocate for the development of palliative care plan that may include avoidance of unnecessary testing and intervention, symptom control, discontinuation of medications, hospice and organ donation.  Counsel as early as possible those with life-limiting chronic disease about palliative care; consider referral to palliative care provider.  Advocate for the development of palliative care plan.   Notes:         This is a list of the screening recommended for you and due dates:   Health Maintenance  Topic Date Due   DTaP/Tdap/Td vaccine (2 - Tdap) 02/15/2020   COVID-19 Vaccine (4 - 2023-24 season) 04/04/2022   Medicare Annual Wellness Visit  07/17/2023   Pneumonia Vaccine  Completed   Flu Shot  Completed   DEXA scan (bone density measurement)  Completed   Zoster (Shingles) Vaccine  Completed   HPV Vaccine  Aged Out   Colon Cancer Screening  Discontinued

## 2022-07-16 NOTE — Patient Instructions (Signed)

## 2022-08-04 DIAGNOSIS — U071 COVID-19: Secondary | ICD-10-CM

## 2022-08-04 HISTORY — DX: COVID-19: U07.1

## 2022-08-06 ENCOUNTER — Other Ambulatory Visit: Payer: Self-pay | Admitting: Family Medicine

## 2022-08-06 DIAGNOSIS — I4891 Unspecified atrial fibrillation: Secondary | ICD-10-CM

## 2022-08-22 ENCOUNTER — Other Ambulatory Visit: Payer: Self-pay | Admitting: Family Medicine

## 2022-08-22 ENCOUNTER — Other Ambulatory Visit: Payer: Self-pay | Admitting: Neurology

## 2022-08-22 DIAGNOSIS — G609 Hereditary and idiopathic neuropathy, unspecified: Secondary | ICD-10-CM

## 2022-08-22 DIAGNOSIS — G20A1 Parkinson's disease without dyskinesia, without mention of fluctuations: Secondary | ICD-10-CM

## 2022-08-22 DIAGNOSIS — G2581 Restless legs syndrome: Secondary | ICD-10-CM

## 2022-08-22 NOTE — Telephone Encounter (Signed)
Sorry Dr. Carles Collet I thought you had to send I took care of it

## 2022-08-25 ENCOUNTER — Telehealth: Payer: Self-pay | Admitting: Family Medicine

## 2022-08-25 NOTE — Telephone Encounter (Signed)
Pts daughter called Butch Penny and has informed me that she is getting over Covid and that her mother Cassie White is beginning to have symptoms. She states that it is pretty much going around their household. She is asking that the medication can be called into the [facility] in [place]

## 2022-08-25 NOTE — Telephone Encounter (Signed)
Pt's daughter advised pt would need to be seen in office or virtual prior to sending any medication. She will test mom today and provide this information during appt. Offered to see if another office had openings for today to be seen, daughter declined.

## 2022-08-26 ENCOUNTER — Telehealth (INDEPENDENT_AMBULATORY_CARE_PROVIDER_SITE_OTHER): Payer: Medicare Other | Admitting: Family Medicine

## 2022-08-26 ENCOUNTER — Encounter: Payer: Self-pay | Admitting: Family Medicine

## 2022-08-26 DIAGNOSIS — J988 Other specified respiratory disorders: Secondary | ICD-10-CM

## 2022-08-26 DIAGNOSIS — U071 COVID-19: Secondary | ICD-10-CM

## 2022-08-26 MED ORDER — MOLNUPIRAVIR EUA 200MG CAPSULE: 4.0000 | ORAL_CAPSULE | Freq: Two times a day (BID) | ORAL | 0 refills | Status: AC

## 2022-08-26 NOTE — Progress Notes (Signed)
Virtual Visit via Video Note  I connected with Cassie White  on 08/26/22 at  9:40 AM EST by a video enabled telemedicine application and verified that I am speaking with the correct person using two identifiers.  Location patient: [place] Location provider:work or home office Persons participating in the virtual visit: patient, provider  I discussed the limitations and requested verbal permission for telemedicine visit. The patient expressed understanding and agreed to proceed.  CC: 82 year old female being seen today for respiratory symptoms.  HPI: Onset 2 days ago, sore throat, nasal congestion/runny nose, a little bit of cough.  Feels tired.  No shortness of breath, no fever, no chest pain. She tested positive for COVID at home. Taking decongestant and Mucinex. Sore throat is much better today.  ROS: See pertinent positives and negatives per HPI.  Past Medical History:  Diagnosis Date   Abnormal mammogram of left breast    Likely benign microcalcifications--repeat L diag mammo 03/24/2017.  COMPLEX SCLEROSING LESION WITH CALCIFICATIONS --no sign of malignancy.  Excision recommended--pt has been referred to Dr. Donne Hazel, who recommended f/u mammo and this was normal 12/2017--consider rpt 1 yr per rad.   Arthritis    DJD, back and both hips   Chronic low back pain without sciatica    Lumbar spondylosis + scoliosis.  Summer 2017 Dr. Nelva Bush did ESI and pt got no relief.  Pt then saw Dr. Maia Petties [facility]: felt she had facet mediated pain; plan for B L345 MBB (??).   Cystocele, midline    Diverticulosis    on colonoscopy 2004   GERD (gastroesophageal reflux disease)    Hx of cardiovascular stress test    Lexiscan Myoview (2/14):  Low risk, small ant defect likely breast attenuation, no ischemia, EF 69% (no change from 2010).     Hyperlipidemia    Hypertension    per pt   Idiopathic scoliosis of thoracolumbar region    Lumbar back pain    Mitral regurgitation    Echo (2/14):  EF  60-65%, Gr 1 DD, mild AI, mild bileaflet MVP, mod post directed MR, mild LAE, PASP 35.   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  Responding to gabapentin (Dr. Brett Fairy).   Osteoporosis, senile    stable left hip 2015 (took fosamax x 79yr, T-score -2.7 when she stopped bisphosphonate).  Rpt DEXA 235moater (09/2016) T-score -.3.1 (different machine, though). 11/2019 DEXA T-score -4. Recommended restart of fosamax   PAF (paroxysmal atrial fibrillation) (HCC)    Dr. JoMartiniqueFlecainide, metopr, apxiaban.  08/2017-->persistent a-fib w/RVR, flecainide increased and she converted back to NSR and felt better.  '100mg'$  bid flecainide continued.   Parkinson's disease 2020 dx   started sinemet 05/2019 (Dr. TaCarles Collet  Peripheral neuropathy 2008   Idiopathic vs familial (motor>sensory) vs lumbar disc dz: gabapentin helpful   Shoulder pain left   RC surg 09/2014    Past Surgical History:  Procedure Laterality Date   BREAST BIOPSY  04/10/2017   Left breast core needle biopsy of calcifications.  COMPLEX SCLEROSING LESION WITH CALCIFICATIONS --no sign of malignancy.  Excision recommended--pt has been referred to Dr. WaDonne Hazelwho recommended rpt mammo and this was NORMAL 12/2017.   BREAST EXCISIONAL BIOPSY Right 2010   Benign   BREAST SURGERY  Nov 2010   Benign biopsy, right   CHOLECYSTECTOMY  03/2010   Dr.Gross   COLONOSCOPY  10/03/2002   No polyps.  Repeat 10  yrs recommended but pt declines.  Pt declined cologuard 08/2016 but changed her mind and this test was NEG on 03/23/17.   COMBINED HYSTERECTOMY VAGINAL / OOPHORECTOMY / A&P REPAIR  6553   uncertain if ovaries were removed or not   CYSTOCELE REPAIR     DEXA  11/2019   T score -4   Lumpectomy  06/2009   "Fatty Necrosis"   PFT's  2014   NORMAL   REVERSE SHOULDER ARTHROPLASTY Right 03/11/2019   Procedure: REVERSE TOTAL SHOULDER ARTHROPLASTY;  Surgeon: Netta Cedars, MD;   Location: [facility];  Service: Orthopedics;  Laterality: Right;  Intersclene block   ROTATOR CUFF REPAIR Left 10/02/14   Dr. Veverly Fells   TONSILLECTOMY     TOTAL KNEE ARTHROPLASTY  09/2009   Right ;Dr Alvan Dame   TOTAL KNEE ARTHROPLASTY Left 03/10/2016   Procedure: TOTAL KNEE ARTHROPLASTY;  Surgeon: Paralee Cancel, MD;  Location: [facility];  Service: Orthopedics;  Laterality: Left;   TRANSTHORACIC ECHOCARDIOGRAM  09/2012; 09/27/15   2014: EF 60-65%, Gr 1 DD, mild AI, mild bileaflet MVP, mod post directed MR, mild LAE, PASP 35.  Repeat 2017: EF 55-60%, mild AR, mild MVP and mild MV regurg.   VAGINAL HYSTERECTOMY     For Uterine Deviation      Current Outpatient Medications:    apixaban (ELIQUIS) 5 MG TABS tablet, Take 1 tablet (5 mg total) by mouth 2 (two) times daily., Disp: 180 tablet, Rfl: 3   Biotin 5000 MCG CAPS, Take 5,000 mcg by mouth daily., Disp: , Rfl:    Calcium Carbonate (CALCIUM 600 PO), Take 600 mg by mouth 2 (two) times daily. , Disp: , Rfl:    carbidopa-levodopa (SINEMET CR) 50-200 MG tablet, TAKE 1 TABLET BY MOUTH EVERYDAY AT BEDTIME, Disp: 90 tablet, Rfl: 1   carbidopa-levodopa (SINEMET IR) 25-100 MG tablet, TAKE 2 TABLETS BY MOUTH 3 TIMES DAILY., Disp: 540 tablet, Rfl: 1   cyanocobalamin (VITAMIN B12) 1000 MCG/ML injection, INJECT 1 ML (1,000 MCG) INTRAMUSCULARLY EVERY 30 DAYS, Disp: 3 mL, Rfl: 0   flecainide (TAMBOCOR) 100 MG tablet, Take 1 tablet (100 mg total) by mouth 2 (two) times daily. Please keep upcoming appointment with Dr Martinique for any further refills., Disp: 180 tablet, Rfl: 3   fluticasone (FLONASE) 50 MCG/ACT nasal spray, Place 2 sprays into both nostrils daily., Disp: 16 g, Rfl: 6   gabapentin (NEURONTIN) 300 MG capsule, TAKE 2 CAPSULES BY MOUTH TWICE A DAY, Disp: 360 capsule, Rfl: 1   metoprolol tartrate (LOPRESSOR) 25 MG tablet, TAKE 1 TABLET BY MOUTH TWICE A DAY, Disp: 60 tablet, Rfl: 0   molnupiravir EUA (LAGEVRIO) 200 mg CAPS capsule, Take 4 capsules (800 mg total) by mouth  2 (two) times daily for 5 days., Disp: 40 capsule, Rfl: 0   Omega-3 Fatty Acids (FISH OIL) 1200 MG CAPS, Take 1,000 mg by mouth 2 (two) times daily. , Disp: , Rfl:    pravastatin (PRAVACHOL) 40 MG tablet, Take 1 tablet (40 mg total) by mouth daily., Disp: 90 tablet, Rfl: 3   raloxifene (EVISTA) 60 MG tablet, TAKE 1 TABLET BY MOUTH EVERY DAY, Disp: 90 tablet, Rfl: 1   traZODone (DESYREL) 50 MG tablet, TAKE 1 TABLET BY MOUTH EVERYDAY AT BEDTIME, Disp: 90 tablet, Rfl: 1   betamethasone dipropionate 0.05 % lotion, SMARTSIG:1 sparingly Topical Daily (Patient not taking: Reported on 08/26/2022), Disp: , Rfl:    nitroGLYCERIN (NITROSTAT) 0.4 MG SL tablet, Place 1 tablet (0.4 mg total) under the tongue every  5 (five) minutes as needed for chest pain. (Patient not taking: Reported on 08/26/2022), Disp: 15 tablet, Rfl: 1  EXAM:  VITALS per patient if applicable:     01/65/5374    2:09 PM 04/15/2022   11:19 AM 03/04/2022   10:48 AM  Vitals with BMI  Height '5\' 1"'$  '5\' 1"'$  '5\' 1"'$   Weight 147 lbs 10 oz 145 lbs 6 oz 147 lbs 10 oz  BMI 27.9 82.70 78.6  Systolic 754 492 010  Diastolic 68 86 68  Pulse 56 67 58    GENERAL: alert, oriented, appears well and in no acute distress  HEENT: atraumatic, conjunttiva clear, no obvious abnormalities on inspection of external nose and ears  NECK: normal movements of the head and neck  LUNGS: on inspection no signs of respiratory distress, breathing rate appears normal, no obvious gross SOB, gasping or wheezing  CV: no obvious cyanosis  MS: moves all visible extremities without noticeable abnormality  PSYCH/NEURO: pleasant and cooperative, no obvious depression or anxiety, speech and thought processing grossly intact  LABS: none today    Chemistry      Component Value Date/Time   NA 141 09/06/2021 1125   NA 145 (H) 06/02/2018 1514   K 4.3 09/06/2021 1125   CL 105 09/06/2021 1125   CO2 32 09/06/2021 1125   BUN 19 09/06/2021 1125   BUN 18 06/02/2018 1514    CREATININE 0.90 09/06/2021 1125      Component Value Date/Time   CALCIUM 9.9 09/06/2021 1125   ALKPHOS 62 09/06/2021 1125   AST 18 09/06/2021 1125   ALT 3 09/06/2021 1125   BILITOT 0.8 09/06/2021 1125     ASSESSMENT AND PLAN:  Discussed the following assessment and plan:  COVID-19 respiratory illness. Mild to moderate symptoms.  Her risk factors for complications due to COVID are: Age greater than 69, atrial fibrillation, and Parkinson's disease. She is within the 2-day window for antiviral treatment. Paxlovid contraindicated with flecainide. GFR 60 Feb 2023. Molnupiravir prescribed today. Continue over-the-counter symptomatic care.   I discussed the assessment and treatment plan with the patient. The patient was provided an opportunity to ask questions and all were answered. The patient agreed with the plan and demonstrated an understanding of the instructions.   F/u: if not improving signif in 3-4d  Signed:  Crissie Sickles, MD           08/26/2022

## 2022-09-08 ENCOUNTER — Encounter: Payer: Self-pay | Admitting: Family Medicine

## 2022-09-08 ENCOUNTER — Ambulatory Visit (INDEPENDENT_AMBULATORY_CARE_PROVIDER_SITE_OTHER): Payer: Medicare Other | Admitting: Family Medicine

## 2022-09-08 VITALS — BP 121/66 | HR 53 | Ht 61.0 in | Wt 144.0 lb

## 2022-09-08 DIAGNOSIS — Z7901 Long term (current) use of anticoagulants: Secondary | ICD-10-CM | POA: Diagnosis not present

## 2022-09-08 DIAGNOSIS — E7800 Pure hypercholesterolemia, unspecified: Secondary | ICD-10-CM

## 2022-09-08 DIAGNOSIS — I10 Essential (primary) hypertension: Secondary | ICD-10-CM | POA: Diagnosis not present

## 2022-09-08 DIAGNOSIS — I4891 Unspecified atrial fibrillation: Secondary | ICD-10-CM

## 2022-09-08 DIAGNOSIS — E538 Deficiency of other specified B group vitamins: Secondary | ICD-10-CM

## 2022-09-08 LAB — CBC
HCT: 39 % (ref 36.0–46.0)
Hemoglobin: 13.2 g/dL (ref 12.0–15.0)
MCHC: 33.8 g/dL (ref 30.0–36.0)
MCV: 91.8 fl (ref 78.0–100.0)
Platelets: 174 10*3/uL (ref 150.0–400.0)
RBC: 4.25 Mil/uL (ref 3.87–5.11)
RDW: 13.5 % (ref 11.5–15.5)
WBC: 7.3 10*3/uL (ref 4.0–10.5)

## 2022-09-08 LAB — COMPREHENSIVE METABOLIC PANEL
ALT: 4 U/L (ref 0–35)
AST: 18 U/L (ref 0–37)
Albumin: 3.8 g/dL (ref 3.5–5.2)
Alkaline Phosphatase: 59 U/L (ref 39–117)
BUN: 15 mg/dL (ref 6–23)
CO2: 29 mEq/L (ref 19–32)
Calcium: 9.2 mg/dL (ref 8.4–10.5)
Chloride: 105 mEq/L (ref 96–112)
Creatinine, Ser: 0.76 mg/dL (ref 0.40–1.20)
GFR: 73.49 mL/min (ref >60.00)
Glucose, Bld: 91 mg/dL (ref 70–99)
Potassium: 4.3 mEq/L (ref 3.5–5.1)
Sodium: 139 mEq/L (ref 135–145)
Total Bilirubin: 0.6 mg/dL (ref 0.2–1.2)
Total Protein: 6 g/dL (ref 6.0–8.3)

## 2022-09-08 LAB — LIPID PANEL
Cholesterol: 174 mg/dL (ref 0–200)
HDL: 72.3 mg/dL (ref >39.00)
LDL Cholesterol: 87 mg/dL (ref 0–99)
NonHDL: 101.59
Total CHOL/HDL Ratio: 2
Triglycerides: 73 mg/dL (ref 0.0–149.0)
VLDL: 14.6 mg/dL (ref 0.0–40.0)

## 2022-09-08 MED ORDER — METOPROLOL TARTRATE 25 MG PO TABS: 25.0000 mg | ORAL_TABLET | Freq: Two times a day (BID) | ORAL | 3 refills | Status: DC

## 2022-09-08 MED ORDER — FLUTICASONE PROPIONATE 50 MCG/ACT NA SUSP: 2.0000 | Freq: Every day | NASAL | 3 refills | Status: DC

## 2022-09-08 MED ORDER — CYANOCOBALAMIN 1000 MCG/ML IJ SOLN: INTRAMUSCULAR | 3 refills | Status: DC

## 2022-09-08 NOTE — Progress Notes (Signed)
OFFICE VISIT  09/08/2022  CC:  Chief Complaint  Patient presents with   Hypertension    3 mo f/u; pt is not fasting    Patient is a 82 y.o. female who presents accompanied by her daughter for f/u HTN, HLD, and osteoporosis. A/P as of 09/06/21 o/v: "1 hypertension.  Well-controlled.  Continue Lopressor 25 mg twice a day.  2.  Hyperlipidemia.  Pravastatin 40 mg a day. LDL was 102 about 6 months ago. Fasting lipids and hepatic panel today.  3.  Osteoporosis.  October 2022 started raloxifene for her osteoporosis. Tolerating this well.  Plan DEXA in 1 year.   4. Preventative health care: Immunizations: She declines any further Tdap. Otherwise vaccines are UTD. Breast ca screening: mammogram UTD as of 07/2021-->followed by Dr. Donne Hazel. Colon cancer screening: cologuard NEG 10/2021.  No further colon cancer screening indicated.   5.  PAF--flecainide and Eliquis per cardiology. She will arrange follow-up with them soon."  INTERIM HX: She is feeling well.  She had COVID respiratory illness about 2 weeks ago, took Paxlovid and says this helped very well.  She has no residual symptoms at all.  Past Medical History:  Diagnosis Date   Abnormal mammogram of left breast    Likely benign microcalcifications--repeat L diag mammo 03/24/2017.  COMPLEX SCLEROSING LESION WITH CALCIFICATIONS --no sign of malignancy.  Excision recommended--pt has been referred to Dr. Donne Hazel, who recommended f/u mammo and this was normal 12/2017--consider rpt 1 yr per rad.   Arthritis    DJD, back and both hips   Chronic low back pain without sciatica    Lumbar spondylosis + scoliosis.  Summer 2017 Dr. Nelva Bush did ESI and pt got no relief.  Pt then saw Dr. Maia Petties with [facility]: felt she had facet mediated pain; plan for B L345 MBB (??).   COVID 08/2022   Cystocele, midline    Diverticulosis    on colonoscopy 2004   GERD (gastroesophageal reflux disease)    Hx of cardiovascular stress test    Lexiscan  Myoview (2/14):  Low risk, small ant defect likely breast attenuation, no ischemia, EF 69% (no change from 2010).     Hyperlipidemia    Hypertension    per pt   Idiopathic scoliosis of thoracolumbar region    Lumbar back pain    Mitral regurgitation    Echo (2/14):  EF 60-65%, Gr 1 DD, mild AI, mild bileaflet MVP, mod post directed MR, mild LAE, PASP 35.   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  Responding to gabapentin (Dr. Brett Fairy).   Osteoporosis, senile    stable left hip 2015 (took fosamax x 48yr, T-score -2.7 when she stopped bisphosphonate).  Rpt DEXA 267moater (09/2016) T-score -.3.1 (different machine, though). 11/2019 DEXA T-score -4. Recommended restart of fosamax   PAF (paroxysmal atrial fibrillation) (HCC)    Dr. JoMartiniqueFlecainide, metopr, apxiaban.  08/2017-->persistent a-fib w/RVR, flecainide increased and she converted back to NSR and felt better.  '100mg'$  bid flecainide continued.   Parkinson's disease 2020 dx   started sinemet 05/2019 (Dr. TaCarles Collet  Peripheral neuropathy 2008   Idiopathic vs familial (motor>sensory) vs lumbar disc dz: gabapentin helpful   Shoulder pain left   RC surg 09/2014    Past Surgical History:  Procedure Laterality Date   BREAST BIOPSY  04/10/2017   Left breast core needle biopsy of calcifications.  COMPLEX SCLEROSING LESION WITH CALCIFICATIONS --no sign  of malignancy.  Excision recommended--pt has been referred to Dr. Donne Hazel, who recommended rpt mammo and this was NORMAL 12/2017.   BREAST EXCISIONAL BIOPSY Right 2010   Benign   BREAST SURGERY  Nov 2010   Benign biopsy, right   CHOLECYSTECTOMY  03/2010   Dr.Gross   COLONOSCOPY  10/03/2002   No polyps.  Repeat 10 yrs recommended but pt declines.  Pt declined cologuard 08/2016 but changed her mind and this test was NEG on 03/23/17.   COMBINED HYSTERECTOMY VAGINAL / OOPHORECTOMY / A&P REPAIR  3662   uncertain if  ovaries were removed or not   CYSTOCELE REPAIR     DEXA  11/2019   T score -4   Lumpectomy  06/2009   "Fatty Necrosis"   PFT's  2014   NORMAL   REVERSE SHOULDER ARTHROPLASTY Right 03/11/2019   Procedure: REVERSE TOTAL SHOULDER ARTHROPLASTY;  Surgeon: Netta Cedars, MD;  Location: [facility];  Service: Orthopedics;  Laterality: Right;  Intersclene block   ROTATOR CUFF REPAIR Left 10/02/14   Dr. Veverly Fells   TONSILLECTOMY     TOTAL KNEE ARTHROPLASTY  09/2009   Right ;Dr Alvan Dame   TOTAL KNEE ARTHROPLASTY Left 03/10/2016   Procedure: TOTAL KNEE ARTHROPLASTY;  Surgeon: Paralee Cancel, MD;  Location: [facility];  Service: Orthopedics;  Laterality: Left;   TRANSTHORACIC ECHOCARDIOGRAM  09/2012; 09/27/15   2014: EF 60-65%, Gr 1 DD, mild AI, mild bileaflet MVP, mod post directed MR, mild LAE, PASP 35.  Repeat 2017: EF 55-60%, mild AR, mild MVP and mild MV regurg.   VAGINAL HYSTERECTOMY     For Uterine Deviation     Outpatient Medications Prior to Visit  Medication Sig Dispense Refill   apixaban (ELIQUIS) 5 MG TABS tablet Take 1 tablet (5 mg total) by mouth 2 (two) times daily. 180 tablet 3   betamethasone dipropionate 0.05 % lotion      Biotin 5000 MCG CAPS Take 5,000 mcg by mouth daily.     Calcium Carbonate (CALCIUM 600 PO) Take 600 mg by mouth 2 (two) times daily.      carbidopa-levodopa (SINEMET CR) 50-200 MG tablet TAKE 1 TABLET BY MOUTH EVERYDAY AT BEDTIME 90 tablet 1   carbidopa-levodopa (SINEMET IR) 25-100 MG tablet TAKE 2 TABLETS BY MOUTH 3 TIMES DAILY. 540 tablet 1   flecainide (TAMBOCOR) 100 MG tablet Take 1 tablet (100 mg total) by mouth 2 (two) times daily. Please keep upcoming appointment with Dr Martinique for any further refills. 180 tablet 3   gabapentin (NEURONTIN) 300 MG capsule TAKE 2 CAPSULES BY MOUTH TWICE A DAY 360 capsule 1   nitroGLYCERIN (NITROSTAT) 0.4 MG SL tablet Place 1 tablet (0.4 mg total) under the tongue every 5 (five) minutes as needed for chest pain. 15 tablet 1   Omega-3 Fatty Acids  (FISH OIL) 1200 MG CAPS Take 1,000 mg by mouth 2 (two) times daily.      pravastatin (PRAVACHOL) 40 MG tablet Take 1 tablet (40 mg total) by mouth daily. 90 tablet 3   raloxifene (EVISTA) 60 MG tablet TAKE 1 TABLET BY MOUTH EVERY DAY 90 tablet 1   traZODone (DESYREL) 50 MG tablet TAKE 1 TABLET BY MOUTH EVERYDAY AT BEDTIME 90 tablet 1   cyanocobalamin (VITAMIN B12) 1000 MCG/ML injection INJECT 1 ML (1,000 MCG) INTRAMUSCULARLY EVERY 30 DAYS 3 mL 0   fluticasone (FLONASE) 50 MCG/ACT nasal spray Place 2 sprays into both nostrils daily. 16 g 6   metoprolol tartrate (LOPRESSOR) 25 MG tablet TAKE  1 TABLET BY MOUTH TWICE A DAY 60 tablet 0   No facility-administered medications prior to visit.    Allergies  Allergen Reactions   Sulfa Antibiotics Swelling    face   Tizanidine Other (See Comments)    delirium    Review of Systems As per HPI  PE:    09/08/2022    1:10 PM 06/02/2022    2:09 PM 04/15/2022   11:19 AM  Vitals with BMI  Height '5\' 1"'$  '5\' 1"'$  '5\' 1"'$   Weight 144 lbs 147 lbs 10 oz 145 lbs 6 oz  BMI 27.22 40.0 86.76  Systolic 195 093 267  Diastolic 66 68 86  Pulse 53 56 67     Physical Exam  Gen: Alert, well appearing.  Patient is oriented to person, place, time, and situation. AFFECT: pleasant, lucid thought and speech. CV: regular, slight bradycardia, soft syst murmur, no diastolic murmur, no r/g Chest is clear, no wheezing or rales. Normal symmetric air entry throughout both lung fields. No chest wall deformities or tenderness. EXT: no clubbing or cyanosis.  no edema.    LABS:  Last CBC Lab Results  Component Value Date   WBC 5.7 02/26/2021   HGB 13.4 02/26/2021   HCT 40.5 02/26/2021   MCV 93.7 02/26/2021   MCH 30.7 03/09/2019   RDW 13.5 02/26/2021   PLT 157.0 12/45/8099   Last metabolic panel Lab Results  Component Value Date   GLUCOSE 79 09/06/2021   NA 141 09/06/2021   K 4.3 09/06/2021   CL 105 09/06/2021   CO2 32 09/06/2021   BUN 19 09/06/2021    CREATININE 0.90 09/06/2021   GFRNONAA >60 03/12/2019   CALCIUM 9.9 09/06/2021   PROT 6.4 09/06/2021   ALBUMIN 4.1 09/06/2021   BILITOT 0.8 09/06/2021   ALKPHOS 62 09/06/2021   AST 18 09/06/2021   ALT 3 09/06/2021   ANIONGAP 6 03/12/2019   Last lipids Lab Results  Component Value Date   CHOL 176 09/06/2021   HDL 68.20 09/06/2021   LDLCALC 86 09/06/2021   LDLDIRECT 87.5 09/07/2012   TRIG 110.0 09/06/2021   CHOLHDL 3 09/06/2021   Last hemoglobin A1c Lab Results  Component Value Date   HGBA1C  09/19/2010    5.3 (NOTE)                                                                       According to the ADA Clinical Practice Recommendations for 2011, when HbA1c is used as a screening test:   >=6.5%   Diagnostic of Diabetes Mellitus           (if abnormal result  is confirmed)  5.7-6.4%   Increased risk of developing Diabetes Mellitus  References:Diagnosis and Classification of Diabetes Mellitus,Diabetes IPJA,2505,39(JQBHA 1):S62-S69 and Standards of Medical Care in         Diabetes - 2011,Diabetes Care,2011,34  (Suppl 1):S11-S61.   Last thyroid functions Lab Results  Component Value Date   TSH 0.80 06/02/2022   Last vitamin D Lab Results  Component Value Date   VD25OH 80.04 04/28/2014   Last vitamin B12 and Folate Lab Results  Component Value Date   VITAMINB12 1,250 (H) 04/28/2014   IMPRESSION AND PLAN:  No problem-specific Assessment &  Plan notes found for this encounter.  1 hypertension.  Well-controlled.  Continue Lopressor 25 mg twice a day. Electrolytes and creatinine today.  2.  Hyperlipidemia.  Pravastatin 40 mg a day. LDL was 86 about 1 year ago. (Non-fasting) lipids and hepatic panel today.  3.  Osteoporosis.  October 2022 started raloxifene for her osteoporosis. Tolerating this well.  rpt DEXA 05/2023.   4. Preventative health care: Immunizations: She declines any further Tdap. Otherwise vaccines are UTD. Breast ca screening: mammogram UTD as of  07/2021-->followed by Dr. Donne Hazel. Colon cancer screening: cologuard NEG 10/2021.  No further colon cancer screening indicated.  An After Visit Summary was printed and given to the patient.  FOLLOW UP: Return in about 6 months (around 03/09/2023) for routine chronic illness f/u.  Signed:  Crissie Sickles, MD           09/08/2022

## 2022-09-23 DIAGNOSIS — L603 Nail dystrophy: Secondary | ICD-10-CM | POA: Diagnosis not present

## 2022-09-23 DIAGNOSIS — L84 Corns and callosities: Secondary | ICD-10-CM | POA: Diagnosis not present

## 2022-09-23 DIAGNOSIS — M79676 Pain in unspecified toe(s): Secondary | ICD-10-CM | POA: Diagnosis not present

## 2022-09-23 DIAGNOSIS — I70203 Unspecified atherosclerosis of native arteries of extremities, bilateral legs: Secondary | ICD-10-CM | POA: Diagnosis not present

## 2022-09-24 ENCOUNTER — Telehealth: Payer: Self-pay | Admitting: Family Medicine

## 2022-09-24 NOTE — Telephone Encounter (Signed)
Pt's daughter noticed change in personality and inappropriate behavior/outbursts in the last 2 days. She wanted to know if medication could be appropriate for patients with parkinson's disease or referral would be appropriate. They are working as a family to assist with patient's care.  Please Advise.

## 2022-09-24 NOTE — Telephone Encounter (Signed)
Pts daughter Butch Penny called and requested to speak with Dr. Anitra Lauth in person or on the phone regarding her mothers decline in mental state. She is asking to have a 1 on 1 discussion without her mother around. Please give Butch Penny  a call at [phone]

## 2022-09-24 NOTE — Telephone Encounter (Signed)
Virtual visit recommended, ok with me if just her daughter is present.

## 2022-09-25 NOTE — Telephone Encounter (Signed)
LVM for daughter to return call.

## 2022-09-25 NOTE — Telephone Encounter (Signed)
LVM for pt's daughter to return call.  Note: please schedule for virtual appt.

## 2022-09-26 NOTE — Telephone Encounter (Signed)
Spoke with pt's daughter, she would like to decline virtual appt for now. She would like to see how things go. If things get worse, she will schedule appt. They will probably see rheumatologist before next follow up on 8/5.

## 2022-10-17 DIAGNOSIS — L309 Dermatitis, unspecified: Secondary | ICD-10-CM | POA: Diagnosis not present

## 2022-10-17 DIAGNOSIS — L650 Telogen effluvium: Secondary | ICD-10-CM | POA: Diagnosis not present

## 2022-10-21 DIAGNOSIS — H25812 Combined forms of age-related cataract, left eye: Secondary | ICD-10-CM | POA: Diagnosis not present

## 2022-10-21 DIAGNOSIS — H25811 Combined forms of age-related cataract, right eye: Secondary | ICD-10-CM | POA: Diagnosis not present

## 2022-10-23 ENCOUNTER — Ambulatory Visit: Payer: Medicare Other | Admitting: Neurology

## 2022-10-23 ENCOUNTER — Encounter: Payer: Self-pay | Admitting: Neurology

## 2022-10-23 VITALS — BP 118/72 | HR 55 | Ht 61.0 in | Wt 145.4 lb

## 2022-10-23 DIAGNOSIS — L97511 Non-pressure chronic ulcer of other part of right foot limited to breakdown of skin: Secondary | ICD-10-CM | POA: Diagnosis not present

## 2022-10-23 DIAGNOSIS — G20A1 Parkinson's disease without dyskinesia, without mention of fluctuations: Secondary | ICD-10-CM | POA: Diagnosis not present

## 2022-10-23 MED ORDER — CARBIDOPA-LEVODOPA 25-100 MG PO TABS: 2.0000 | ORAL_TABLET | Freq: Three times a day (TID) | ORAL | 1 refills | Status: DC

## 2022-10-23 MED ORDER — CARBIDOPA-LEVODOPA ER 50-200 MG PO TBCR: 1.0000 | EXTENDED_RELEASE_TABLET | Freq: Every day | ORAL | 1 refills | Status: DC

## 2022-10-23 NOTE — Progress Notes (Signed)
Assessment/Plan:   1.  Parkinsons Disease  -Continue carbidopa/levodopa 25/100, 2  tablet 3 times per day but spread meds out.  Currently taking at 8am/10am/3pm.  Take at 8am/noon/4-5pm.  May help tremor some  -continue carbidopa/levodopa 50/200 CR at bed to see if helps rls  -We discussed that it used to be thought that levodopa would increase risk of melanoma but now it is believed that Parkinsons itself likely increases risk of melanoma. she is to get regular skin checks.  She is doing this.  -talked to them about driving.  Discussed lowered reflex/reaction time with aging and Parkinsons Disease and not sure that she is going to be able to continued driving.  They state that they are prepared for this and understand this.  2.  Insomnia  -continue trazodone, 50 mg q hs.  Risk, benefits, side effects discussed  -the prime reason not sleeping all way through night is a bit of nocturia so needs to ask pcp abou that.     Subjective:   Cassie White was seen today in follow up for Parkinsons disease.  My previous records were reviewed prior to todays visit as well as outside records available to me.  The patient with daughter who supplements the hx.  Feels like tremors are getting worse.  Pt denies falls.  Pt denies lightheadedness, near syncope.  No hallucinations.  Mood has been good.   Trazodone is helping her sleep but still awakens around 2:30am to go to the RR and then has trouble going to sleep sometimes.    Current prescribed movement disorder medications: Carbidopa/levodopa 25/100, 2 tablets 3 times per day (currently taking 8am/10-11am/3pm) carbidopa/levodopa 50/200 at bed Trazodone, 50 mg nightly   PREVIOUS MEDICATIONS: Sinemet  ALLERGIES:   Allergies  Allergen Reactions   Sulfa Antibiotics Swelling    face   Tizanidine Other (See Comments)    delirium    CURRENT MEDICATIONS:  Outpatient Encounter Medications as of 10/23/2022  Medication Sig   apixaban (ELIQUIS) 5  MG TABS tablet Take 1 tablet (5 mg total) by mouth 2 (two) times daily.   betamethasone dipropionate 0.05 % lotion    Biotin 5000 MCG CAPS Take 5,000 mcg by mouth daily.   Calcium Carbonate (CALCIUM 600 PO) Take 600 mg by mouth 2 (two) times daily.    carbidopa-levodopa (SINEMET CR) 50-200 MG tablet TAKE 1 TABLET BY MOUTH EVERYDAY AT BEDTIME   carbidopa-levodopa (SINEMET IR) 25-100 MG tablet TAKE 2 TABLETS BY MOUTH 3 TIMES DAILY.   cyanocobalamin (VITAMIN B12) 1000 MCG/ML injection INJECT 1 ML (1,000 MCG) INTRAMUSCULARLY EVERY 30 DAYS   flecainide (TAMBOCOR) 100 MG tablet Take 1 tablet (100 mg total) by mouth 2 (two) times daily. Please keep upcoming appointment with Dr Martinique for any further refills.   fluticasone (FLONASE) 50 MCG/ACT nasal spray Place 2 sprays into both nostrils daily.   gabapentin (NEURONTIN) 300 MG capsule TAKE 2 CAPSULES BY MOUTH TWICE A DAY   metoprolol tartrate (LOPRESSOR) 25 MG tablet Take 1 tablet (25 mg total) by mouth 2 (two) times daily.   nitroGLYCERIN (NITROSTAT) 0.4 MG SL tablet Place 1 tablet (0.4 mg total) under the tongue every 5 (five) minutes as needed for chest pain.   Omega-3 Fatty Acids (FISH OIL) 1200 MG CAPS Take 1,000 mg by mouth 2 (two) times daily.    pravastatin (PRAVACHOL) 40 MG tablet Take 1 tablet (40 mg total) by mouth daily.   raloxifene (EVISTA) 60 MG tablet TAKE 1 TABLET  BY MOUTH EVERY DAY   traZODone (DESYREL) 50 MG tablet TAKE 1 TABLET BY MOUTH EVERYDAY AT BEDTIME   No facility-administered encounter medications on file as of 10/23/2022.    Objective:   PHYSICAL EXAMINATION:    VITALS:   Vitals:   10/23/22 1013  BP: 118/72  Pulse: (!) 55  SpO2: 95%  Weight: 145 lb 6.4 oz (66 kg)  Height: 5\' 1"  (1.549 m)    GEN:  The patient appears stated age and is in NAD. HEENT:  Normocephalic, atraumatic.  The mucous membranes are moist. The superficial temporal arteries are without ropiness or tenderness. CV:  rrr Lungs:  CTAB Neck/HEME:   There are no carotid bruits bilaterally.  Neurological examination:  Orientation: The patient is alert and oriented x3.  Looks to daughter for finer aspects of hx Cranial nerves: There is good facial symmetry with min facial hypomimia. The speech is fluent and clear. Soft palate rises symmetrically and there is no tongue deviation. Hearing is intact to conversational tone. Sensation: Sensation is intact to light touch throughout Motor: Strength is at least antigravity x4.  Movement examination: Tone: There is nl tone in the UE/LE Abnormal movements: rare tremor of the RUE, rest Coordination:  There is no decremation with RAM's, with any form of RAMS, including alternating supination and pronation of the forearm, hand opening and closing, finger taps, heel taps and toe taps. Gait and Station: The patient has min difficulty arising out of a deep-seated chair without the use of the hands. The patient is antalgic.  She is forward flexed.  She drags the legs with ambulation.  Ambulates with rollator.  This is similar to last visit  I have reviewed and interpreted the following labs independently    Chemistry      Component Value Date/Time   NA 139 09/08/2022 1348   NA 145 (H) 06/02/2018 1514   K 4.3 09/08/2022 1348   CL 105 09/08/2022 1348   CO2 29 09/08/2022 1348   BUN 15 09/08/2022 1348   BUN 18 06/02/2018 1514   CREATININE 0.76 09/08/2022 1348      Component Value Date/Time   CALCIUM 9.2 09/08/2022 1348   ALKPHOS 59 09/08/2022 1348   AST 18 09/08/2022 1348   ALT 4 09/08/2022 1348   BILITOT 0.6 09/08/2022 1348       Lab Results  Component Value Date   WBC 7.3 09/08/2022   HGB 13.2 09/08/2022   HCT 39.0 09/08/2022   MCV 91.8 09/08/2022   PLT 174.0 09/08/2022    Lab Results  Component Value Date   TSH 0.80 06/02/2022     Total time spent on today's visit was 24 minutes, including both face-to-face time and nonface-to-face time.  Time included that spent on review of  records (prior notes available to me/labs/imaging if pertinent), discussing treatment and goals, answering patient's questions and coordinating care.  Cc:  Tammi Sou, MD

## 2022-10-23 NOTE — Patient Instructions (Addendum)
Take your carbidopa/levodopa, 2 tablets at 8am/noon/and then 4-5pm Continue carbidopa/levodopa 50/200 at bedtime Continue trazodone 50 at bedtime  Local and Online Resources for Power over Parkinson's Group  March 2024   LOCAL [place] PARKINSON'S GROUPS   Power over Parkinson's Group:    Power Over Parkinson's Patient Education Group will be Wednesday, March 13th-*Hybrid meting*- in person at [facility] location and via [facility], 2:00-3:00 pm.   Starting in November 2023, Power over [employer] and Care Partner Groups will meet together, with plans for separate break out session for caregivers (*this will be evolving over the next few months) Upcoming Power over Parkinson's Meetings/Care Partner Support:  2nd Wednesdays of the month at 2 pm:  March 13th, April 10th Clarissa at [email or web address] if interested in participating in this group    LOCAL EVENTS AND NEW OFFERINGS  Let's Try Pickleball-$25 for 6 weeks of Pickleball, starting February 2nd.  Contact Corwin Levins for more details.  [email or web address] NEW:  Parkinson's Social Game Night.  First Thursday of each month, 2:00-4:00 pm.  *Next date is MARCH 7th*.  [place], [employer].  Contact [email or web address] if interested. Parkinson's CarePartner Group for Men is in the works, if interested email [email or web address] ACT FITNESS Chair Yoga classes "Train and Gain", Fridays 10 am, [employer].  Contact Gina at [phone].  PWR! Moves Dyne[employer] Instructor-Led Classes offering at [employer]!  TUESDAYS and Wednesdays 1-2 pm.   Contact Vonna Kotyk at  [email or web address]  or [phone] (Tuesday classes are modified for chair and standing only) Drumming for Parkinson's will be held on 2nd and 4th Mondays at 11:00 am.   Located at the [place] ([place].)  Contact Doylene Canning  at [email or web address] or [phone]  Dance for Parkinson 's classes will be on Tuesdays 10-11 am starting in February. Located in the [employer] , in the first floor of the [employer] ([place] To register:  [email or web address] or [phone] [facility] [place] Class, Mondays at 11 am.  Call [phone] for details Moving [place].  Saturday, May 4th, 10 am start.  Register at [employer].[place]:  [email or web address]  PD Health at Home continues:  Mindfulness Mondays, Wellness Wednesdays, Fitness Fridays  (PWR! Moves as part of Fitness Fridays March 22nd, 1-1:45 pm) Upcoming Education:   Managing "Off" Periods:  Return of Parkinson's Symptoms.  Wednesday, March 20th, 1-2 pm Parkinson's 101.  Wednesday, April 3rd, 1-2 pm Expert Briefing:  Understanding Pain in Parkinson's.   Wednesday, March 13th, 1-2 pm  Research Update:  Working to [employer] PD.  Wednesday, April 10th, 1-2 pm Register for virtual education and Patent attorney (webinars) at [email or web address].pl Please check out their website to sign up for emails and see their full online offerings     [place]:  [email or web address]   Third Thursday Webinars:  On the third Thursday of every month at 12 p.m. ET, join our free live webinars to learn about various aspects of living with Parkinson's disease and our work to speed medical breakthroughs.  Upcoming Webinar:  Everyday Exposure to Parkinson's:  Environmental Connections to the Disease.  Thursday, March 21st at 12 noon. Check out additional information on their website to see their full online offerings    [facility]:  [email or web address]  Upcoming Webinar:   Nutrition and Parkinson's.  Wednesday, March 6th, 12  noon Webinar Series:  Living with Parkinson's Meetup.   Third Thursdays  each month, 3 pm  Care Partner Monthly Meetup.  With Robin Searing Phinney.  First Tuesday of each month, 2 pm  Check out additional information to Live Well Today on their website    Parkinson and Movement Disorders (PMD) Alliance:  www.pmdalliance.org  NeuroLife Online:  Online Education Events  Sign up for emails, which are sent weekly to give you updates on programming and online offerings    Parkinson's Association of the [place]:  www.[email or web address]  Information on online support groups, education events, and online exercises including Yoga, Parkinson's exercises and more-LOTS of information on links to PD resources and online events  Virtual Support Group through Parkinson's Association of the [place]; next one is scheduled for Wednesday, March 6th  MOVEMENT AND EXERCISE OPPORTUNITIES  PWR! Moves Classes at [place].  Wednesdays 10 and 11 am.   Contact Amy Marriott, PT [email or web address] if interested.  PWR! Moves Class offerings at [employer]. *TUESDAYS* and Wednesdays 1-2 pm.    Contact Vonna Kotyk at  [email or web address]    Parkinson's Wellness Recovery (PWR! Moves)  www.[email or web address]  Info on the PWR! Virtual Experience:  You will have access to our expertise?through self-assessment, guided plans that start with the PD-specific fundamentals, educational content, tips, Q&A with an expert, and a growing Art therapist of PD-specific pre-recorded and live exercise classes of varying types and intensity - both physical and cognitive! If that is not enough, we offer 1:1 wellness consultations (in-person or virtual) to personalize your PWR! Research scientist (medical).   [place] Fridays:   As part of the PD Health @ Home program, this free video series focuses each week on one aspect of fitness designed to support people living with Parkinson's.? These weekly videos highlight the [place] fitness guidelines for  people with Parkinson's disease.  ModemGamers.si  Dance for PD website is offering free, live-stream classes throughout the week, as well as links to [employer] of classes:  [email or web address]  Virtual dance and Pilates for Parkinson's classes: Click on the Community Tab> Parkinson's Movement Initiative Tab.  To register for classes and for more information, visit [email or web address] and click the "community" tab.   [employer] Parkinson's Cycling Classes   [employer]:  Thursdays @ Noon-Live classes at [employer] ([employer] at [email or web address]?or [phone])  [employer]: Virtual Classes Mondays and Thursdays Jeanette Caprice classes Tuesday, Wednesday and Thursday (contact Trail at [email or web address] ?or ([phone])  [place]  Varied levels of classes are offered Tuesdays and Thursdays at [employer].   Stretching with Verdis Frederickson weekly class is also offered for people with Parkinson's  To observe a class or for more information, call [phone] or email Hezzie Bump at [email or web address]   ADDITIONAL SUPPORT AND RESOURCES  Well[employer]Spring [employer]:Online Caregiver Education Opportunities:  www.well-springsolutions.org/caregiver-education/caregiver-support-group.  You may also contact Vickki Muff at [email or web address] or [phone].     Family Caregiver [phone].  Thursday, March 7th, 10:15-1:45 at [facility].  Register with [facility] (see above) Well-Spring Navigator:  Just1Navigator program, a?free service to help individuals and families through the journey of determining care for older adults.  The "Navigator" is a Vickki Muff, Education officer, museum, who will speak with a prospective client and/or loved ones to provide an assessment of the situation and a set of recommendations for a personalized care plan -- all free of charge, and  whether?Well[employer]Spring [employer] offers the needed service or not. If the need is not a  service we provide, we are well-connected with reputable programs in town that we can refer you to.  www.well-springsolutions.org or to speak with the Navigator, call [phone].

## 2022-11-06 DIAGNOSIS — H25812 Combined forms of age-related cataract, left eye: Secondary | ICD-10-CM | POA: Diagnosis not present

## 2022-11-06 DIAGNOSIS — H269 Unspecified cataract: Secondary | ICD-10-CM | POA: Diagnosis not present

## 2022-11-17 ENCOUNTER — Ambulatory Visit (INDEPENDENT_AMBULATORY_CARE_PROVIDER_SITE_OTHER): Payer: Medicare Other | Admitting: Family Medicine

## 2022-11-17 ENCOUNTER — Encounter: Payer: Self-pay | Admitting: Family Medicine

## 2022-11-17 VITALS — BP 132/79 | HR 58 | Ht 61.0 in | Wt 142.8 lb

## 2022-11-17 DIAGNOSIS — R498 Other voice and resonance disorders: Secondary | ICD-10-CM

## 2022-11-17 DIAGNOSIS — F5104 Psychophysiologic insomnia: Secondary | ICD-10-CM

## 2022-11-17 NOTE — Progress Notes (Signed)
OFFICE VISIT  12/01/2022  CC:  Chief Complaint  Patient presents with   Hoarse   HPI: Patient is a 82 y.o. female who presents accompanied by her daughter for weak voice, waxing and waning in intensity.  Hard to tell the acuity of this but sounds like more like subacute.  No hoarseness of voice.  No cough, no sore throat, no swallowing difficulties.   Past Medical History:  Diagnosis Date   Abnormal mammogram of left breast    Likely benign microcalcifications--repeat L diag mammo 03/24/2017.  COMPLEX SCLEROSING LESION WITH CALCIFICATIONS --no sign of malignancy.  Excision recommended--pt has been referred to Dr. Dwain Sarna, who recommended f/u mammo and this was normal 12/2017--consider rpt 1 yr per rad.   Arthritis    DJD, back and both hips   Chronic low back pain without sciatica    Lumbar spondylosis + scoliosis.  Summer 2017 Dr. Ethelene Hal did ESI and pt got no relief.  Pt then saw Dr. Retia Passe [facility]: felt she had facet mediated pain; plan for B L345 MBB (??).   COVID 08/2022   Cystocele, midline    Diverticulosis    on colonoscopy 2004   GERD (gastroesophageal reflux disease)    Hx of cardiovascular stress test    Lexiscan Myoview (2/14):  Low risk, small ant defect likely breast attenuation, no ischemia, EF 69% (no change from 2010).     Hyperlipidemia    Hypertension    per pt   Idiopathic scoliosis of thoracolumbar region    Lumbar back pain    Mitral regurgitation    Echo (2/14):  EF 60-65%, Gr 1 DD, mild AI, mild bileaflet MVP, mod post directed MR, mild LAE, PASP 35.   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  Responding to gabapentin (Dr. Vickey Huger).   Osteoporosis, senile    stable left hip 2015 (took fosamax x 48yrs, T-score -2.7 when she stopped bisphosphonate).  Rpt DEXA 60mo later (09/2016) T-score -.3.1 (different machine, though). 11/2019 DEXA T-score -4. Recommended  restart of fosamax   PAF (paroxysmal atrial fibrillation) (HCC)    Dr. [name]: Flecainide, metopr, apxiaban.  08/2017-->persistent a-fib w/RVR, flecainide increased and she converted back to NSR and felt better.  100mg  bid flecainide continued.   Parkinson's disease 2020 dx   started sinemet 05/2019 (Dr. Arbutus Leas)   Peripheral neuropathy 2008   Idiopathic vs familial (motor>sensory) vs lumbar disc dz: gabapentin helpful   Shoulder pain left   RC surg 09/2014    Past Surgical History:  Procedure Laterality Date   BREAST BIOPSY  04/10/2017   Left breast core needle biopsy of calcifications.  COMPLEX SCLEROSING LESION WITH CALCIFICATIONS --no sign of malignancy.  Excision recommended--pt has been referred to Dr. Dwain Sarna, who recommended rpt mammo and this was NORMAL 12/2017.   BREAST EXCISIONAL BIOPSY Right 2010   Benign   BREAST SURGERY  Nov 2010   Benign biopsy, right   CHOLECYSTECTOMY  03/2010   Dr.Gross   COLONOSCOPY  10/03/2002   No polyps.  Repeat 10 yrs recommended but pt declines.  Pt declined cologuard 08/2016 but changed her mind and this test was NEG on 03/23/17.   COMBINED HYSTERECTOMY VAGINAL / OOPHORECTOMY / A&P REPAIR  1990   uncertain if ovaries were removed or not   CYSTOCELE REPAIR     DEXA  11/2019   T score -4   Lumpectomy  06/2009   "Fatty Necrosis"  PFT's  2014   NORMAL   REVERSE SHOULDER ARTHROPLASTY Right 03/11/2019   Procedure: REVERSE TOTAL SHOULDER ARTHROPLASTY;  Surgeon: Beverely Low, MD;  Location: [facility];  Service: Orthopedics;  Laterality: Right;  Intersclene block   ROTATOR CUFF REPAIR Left 10/02/14   Dr. Ranell Patrick   TONSILLECTOMY     TOTAL KNEE ARTHROPLASTY  09/2009   Right ;Dr Charlann Boxer   TOTAL KNEE ARTHROPLASTY Left 03/10/2016   Procedure: TOTAL KNEE ARTHROPLASTY;  Surgeon: Durene Romans, MD;  Location: [facility];  Service: Orthopedics;  Laterality: Left;   TRANSTHORACIC ECHOCARDIOGRAM  09/2012; 09/27/15   2014: EF 60-65%, Gr 1 DD, mild AI, mild bileaflet MVP, mod  post directed MR, mild LAE, PASP 35.  Repeat 2017: EF 55-60%, mild AR, mild MVP and mild MV regurg.   VAGINAL HYSTERECTOMY     For Uterine Deviation     Outpatient Medications Prior to Visit  Medication Sig Dispense Refill   apixaban (ELIQUIS) 5 MG TABS tablet Take 1 tablet (5 mg total) by mouth 2 (two) times daily. 180 tablet 3   betamethasone dipropionate 0.05 % lotion      Biotin 5000 MCG CAPS Take 5,000 mcg by mouth daily.     Calcium Carbonate (CALCIUM 600 PO) Take 600 mg by mouth 2 (two) times daily.      carbidopa-levodopa (SINEMET CR) 50-200 MG tablet Take 1 tablet by mouth at bedtime. 90 tablet 1   carbidopa-levodopa (SINEMET IR) 25-100 MG tablet Take 2 tablets by mouth 3 (three) times daily. 8am/noon/4pm 540 tablet 1   cyanocobalamin (VITAMIN B12) 1000 MCG/ML injection INJECT 1 ML (1,000 MCG) INTRAMUSCULARLY EVERY 30 DAYS 10 mL 3   flecainide (TAMBOCOR) 100 MG tablet Take 1 tablet (100 mg total) by mouth 2 (two) times daily. Please keep upcoming appointment with Dr [name] for any further refills. 180 tablet 3   fluticasone (FLONASE) 50 MCG/ACT nasal spray Place 2 sprays into both nostrils daily. 48 g 3   gabapentin (NEURONTIN) 300 MG capsule TAKE 2 CAPSULES BY MOUTH TWICE A DAY 360 capsule 1   metoprolol tartrate (LOPRESSOR) 25 MG tablet Take 1 tablet (25 mg total) by mouth 2 (two) times daily. 180 tablet 3   nitroGLYCERIN (NITROSTAT) 0.4 MG SL tablet Place 1 tablet (0.4 mg total) under the tongue every 5 (five) minutes as needed for chest pain. 15 tablet 1   Omega-3 Fatty Acids (FISH OIL) 1200 MG CAPS Take 1,000 mg by mouth 2 (two) times daily.      pravastatin (PRAVACHOL) 40 MG tablet Take 1 tablet (40 mg total) by mouth daily. 90 tablet 3   raloxifene (EVISTA) 60 MG tablet TAKE 1 TABLET BY MOUTH EVERY DAY 90 tablet 1   traZODone (DESYREL) 50 MG tablet TAKE 1 TABLET BY MOUTH EVERYDAY AT BEDTIME 90 tablet 1   No facility-administered medications prior to visit.    Allergies   Allergen Reactions   Sulfa Antibiotics Swelling    face   Tizanidine Other (See Comments)    delirium    Review of Systems  As per HPI  PE:    11/17/2022    2:34 PM 10/23/2022   10:13 AM 09/08/2022    1:10 PM  Vitals with BMI  Height 5\' 1"  5\' 1"  5\' 1"   Weight 142 lbs 13 oz 145 lbs 6 oz 144 lbs  BMI 27 27.49 27.22  Systolic 132 118 161  Diastolic 79 72 66  Pulse 58 55 53     Physical Exam  Gen: Alert, well appearing.  Patient is oriented to person, place, time, and situation. AFFECT: pleasant, lucid thought and speech. VWU:JWJX: no injection, icteris, swelling, or exudate.  EOMI, PERRLA. Mouth: lips without lesion/swelling.  Oral mucosa pink and moist. Oropharynx without erythema, exudate, or swelling.    LABS:  Last CBC Lab Results  Component Value Date   WBC 7.3 09/08/2022   HGB 13.2 09/08/2022   HCT 39.0 09/08/2022   MCV 91.8 09/08/2022   MCH 30.7 03/09/2019   RDW 13.5 09/08/2022   PLT 174.0 09/08/2022   Last metabolic panel Lab Results  Component Value Date   GLUCOSE 91 09/08/2022   NA 139 09/08/2022   K 4.3 09/08/2022   CL 105 09/08/2022   CO2 29 09/08/2022   BUN 15 09/08/2022   CREATININE 0.76 09/08/2022   GFRNONAA >60 03/12/2019   CALCIUM 9.2 09/08/2022   PROT 6.0 09/08/2022   ALBUMIN 3.8 09/08/2022   BILITOT 0.6 09/08/2022   ALKPHOS 59 09/08/2022   AST 18 09/08/2022   ALT 4 09/08/2022   ANIONGAP 6 03/12/2019   Last thyroid functions Lab Results  Component Value Date   TSH 0.80 06/02/2022   IMPRESSION AND PLAN:  #1 weakness of voice. Suspect this is secondary to her parkinsonism. Assured. Observe.  #2 insomnia.  She takes trazodone 50 mg nightly.  She initiates sleep well but sometimes wakes up after only as few hours. Okay to take a second trazodone tab as needed.  An After Visit Summary was printed and given to the patient.  FOLLOW UP: As needed  Signed:  Santiago Bumpers, MD           12/01/2022

## 2022-11-19 ENCOUNTER — Telehealth: Payer: Self-pay

## 2022-11-19 NOTE — Telephone Encounter (Signed)
Patient's daughter is calling in, they wanted to let Dr.Tat that Zareah is having some new symptoms of Parkinsons. Have noticed her feet are becoming coiled and curled, and distomia in her voice. They have seen PCP for the voice issues but wanted to know what to do about her feet, wondered if they need to see their Podiatrist or if Dr.Tat could recommend something.

## 2022-11-20 NOTE — Telephone Encounter (Signed)
Called patients daughter to ask the follow up questions

## 2022-11-20 NOTE — Telephone Encounter (Signed)
Called patients daughter unable to reach left voicemail message

## 2022-11-20 NOTE — Telephone Encounter (Signed)
Called patients daughter and asked her the following per Dr. Arbutus Leas: Have they noticed that this occurs when medicine  wears off or at night or just after taking a dose or  they don't know?  If they don't know, will ask that  they first pay attention to timing to see if related to  timing of meds (or needing meds)   Patients daughter stated that both of patients feet are curling and has been a long term issue. Daughter states she did not realize that this was a symptom of parkinson's and found this out through her own research. She states it is getting worse and painful for patient. Patient sees a podiatrist and would like to see a new podiatrist but daughter wanted to check with Dr. Arbutus Leas to see if this something that patient needs to see the podiatrist for or Dr. Arbutus Leas? I asked patient why it was never brought up to Dr. Arbutus Leas at patients follow up and she stated that she wasn't aware it was something to do with parkinson's.   Patients daughter stated that as far as the Dystonia in patients voice this has been going on for a few months and does come and go. She tried zyrtec thinking it was allergies. PCP seen patient and stated it is a parkinson's issue. Again, patients daughter stated she read online that this is a parkinson's issue as well.   Patients daughter stated if she does not answer when we call back we may leave a detailed message on her phone.

## 2022-11-20 NOTE — Telephone Encounter (Signed)
Daughter returned phone call.  

## 2022-11-21 NOTE — Telephone Encounter (Signed)
Spoke to patients daughter and patient is not impacted by the medication indicating it is not parkinson's but a foot issue needing to follow up with the podiatrist

## 2022-11-27 DIAGNOSIS — H269 Unspecified cataract: Secondary | ICD-10-CM | POA: Diagnosis not present

## 2022-11-27 DIAGNOSIS — H25811 Combined forms of age-related cataract, right eye: Secondary | ICD-10-CM | POA: Diagnosis not present

## 2022-12-04 ENCOUNTER — Encounter: Payer: Self-pay | Admitting: Cardiology

## 2022-12-04 ENCOUNTER — Telehealth: Payer: Self-pay | Admitting: Podiatry

## 2022-12-04 ENCOUNTER — Ambulatory Visit (INDEPENDENT_AMBULATORY_CARE_PROVIDER_SITE_OTHER): Payer: Medicare Other

## 2022-12-04 ENCOUNTER — Ambulatory Visit: Payer: Medicare Other | Admitting: Podiatry

## 2022-12-04 ENCOUNTER — Ambulatory Visit: Payer: Medicare Other | Attending: Cardiology | Admitting: Cardiology

## 2022-12-04 VITALS — BP 132/50 | HR 62 | Ht 61.0 in | Wt 142.0 lb

## 2022-12-04 DIAGNOSIS — E785 Hyperlipidemia, unspecified: Secondary | ICD-10-CM

## 2022-12-04 DIAGNOSIS — M216X2 Other acquired deformities of left foot: Secondary | ICD-10-CM | POA: Diagnosis not present

## 2022-12-04 DIAGNOSIS — I341 Nonrheumatic mitral (valve) prolapse: Secondary | ICD-10-CM | POA: Diagnosis not present

## 2022-12-04 DIAGNOSIS — M79674 Pain in right toe(s): Secondary | ICD-10-CM | POA: Diagnosis not present

## 2022-12-04 DIAGNOSIS — M2042 Other hammer toe(s) (acquired), left foot: Secondary | ICD-10-CM

## 2022-12-04 DIAGNOSIS — L84 Corns and callosities: Secondary | ICD-10-CM

## 2022-12-04 DIAGNOSIS — M79675 Pain in left toe(s): Secondary | ICD-10-CM | POA: Diagnosis not present

## 2022-12-04 DIAGNOSIS — I480 Paroxysmal atrial fibrillation: Secondary | ICD-10-CM | POA: Diagnosis not present

## 2022-12-04 DIAGNOSIS — B351 Tinea unguium: Secondary | ICD-10-CM | POA: Diagnosis not present

## 2022-12-04 DIAGNOSIS — Z7901 Long term (current) use of anticoagulants: Secondary | ICD-10-CM | POA: Diagnosis not present

## 2022-12-04 DIAGNOSIS — M2041 Other hammer toe(s) (acquired), right foot: Secondary | ICD-10-CM

## 2022-12-04 MED ORDER — PRAVASTATIN SODIUM 40 MG PO TABS: 40.0000 mg | ORAL_TABLET | Freq: Every day | ORAL | 3 refills | Status: DC

## 2022-12-04 MED ORDER — APIXABAN 5 MG PO TABS: 5.0000 mg | ORAL_TABLET | Freq: Two times a day (BID) | ORAL | 3 refills | Status: DC

## 2022-12-04 MED ORDER — FLECAINIDE ACETATE 100 MG PO TABS: 100.0000 mg | ORAL_TABLET | Freq: Two times a day (BID) | ORAL | 3 refills | Status: DC

## 2022-12-04 NOTE — Telephone Encounter (Signed)
Pts daughter called and was at shoe market and was not sure what type of shoe Dr Ardelle Anton had mentioned. She aid there was to be something up front for pt and they did not get anything.  I did explain that Dr Ardelle Anton was in a meeting and his assistant was at lunch, but to ask for PJ or Melissa and tell them you are wanting an extra depth shoe.

## 2022-12-04 NOTE — Progress Notes (Addendum)
Subjective:   Patient ID: Cassie White, female   DOB: 82 y.o.   MRN: [id number]   HPI Chief Complaint  Patient presents with   Foot Problem    bil toes on both feet are beginning to curl - suffers from Parkinson's disease   Callouses    Callus trim to ball of feet left foot and 4th toe on right foot.     82 year old female presents for the above concerns.  She said the toes are starting to curl and is causing pain mostly in the right fourth toe but she also gets a painful lesion on the ball of her left foot causing pain.  She previously seen another podiatrist as well who trim her nails and calluses and she also needs her nails trimmed today as her thickened elongated she cannot trim herself.  She does not report any open lesions.    She is on Eliquis   Review of Systems  All other systems reviewed and are negative.  Past Medical History:  Diagnosis Date   Abnormal mammogram of left breast    Likely benign microcalcifications--repeat L diag mammo 03/24/2017.  COMPLEX SCLEROSING LESION WITH CALCIFICATIONS --no sign of malignancy.  Excision recommended--pt has been referred to Dr. Dwain Sarna, who recommended f/u mammo and this was normal 12/2017--consider rpt 1 yr per rad.   Arthritis    DJD, back and both hips   Chronic low back pain without sciatica    Lumbar spondylosis + scoliosis.  Summer 2017 Dr. Ethelene Hal did ESI and pt got no relief.  Pt then saw Dr. Retia [facility]se [facility]: felt she had facet mediated pain; plan for B L345 MBB (??).   COVID 08/2022   Cystocele, midline    Diverticulosis    on colonoscopy 2004   GERD (gastroesophageal reflux disease)    Hx of cardiovascular stress test    Lexiscan Myoview (2/14):  Low risk, small ant defect likely breast attenuation, no ischemia, EF 69% (no change from 2010).     Hyperlipidemia    Hypertension    per pt   Idiopathic scoliosis of thoracolumbar region    Lumbar back pain    Mitral regurgitation    Echo (2/14):  EF  60-65%, Gr 1 DD, mild AI, mild bileaflet MVP, mod post directed MR, mild LAE, PASP 35.   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  Responding to gabapentin (Dr. Vickey Huger).   Osteoporosis, senile    stable left hip 2015 (took fosamax x 70yrs, T-score -2.7 when she stopped bisphosphonate).  Rpt DEXA 99mo later (09/2016) T-score -.3.1 (different machine, though). 11/2019 DEXA T-score -4. Recommended restart of fosamax   PAF (paroxysmal atrial fibrillation) (HCC)    Dr. [name]: Flecainide, metopr, apxiaban.  08/2017-->persistent a-fib w/RVR, flecainide increased and she converted back to NSR and felt better.  100mg  bid flecainide continued.   Parkinson's disease 2020 dx   started sinemet 05/2019 (Dr. Arbutus Leas)   Peripheral neuropathy 2008   Idiopathic vs familial (motor>sensory) vs lumbar disc dz: gabapentin helpful   Shoulder pain left   RC surg 09/2014    Past Surgical History:  Procedure Laterality Date   BREAST BIOPSY  04/10/2017   Left breast core needle biopsy of calcifications.  COMPLEX SCLEROSING LESION WITH CALCIFICATIONS --no sign of malignancy.  Excision recommended--pt has been referred to Dr. Dwain Sarna, who recommended rpt mammo and this was NORMAL 12/2017.   BREAST EXCISIONAL BIOPSY  Right 2010   Benign   BREAST SURGERY  Nov 2010   Benign biopsy, right   CHOLECYSTECTOMY  03/2010   Dr.Gross   COLONOSCOPY  10/03/2002   No polyps.  Repeat 10 yrs recommended but pt declines.  Pt declined cologuard 08/2016 but changed her mind and this test was NEG on 03/23/17.   COMBINED HYSTERECTOMY VAGINAL / OOPHORECTOMY / A&P REPAIR  1990   uncertain if ovaries were removed or not   CYSTOCELE REPAIR     DEXA  11/2019   T score -4   Lumpectomy  06/2009   "Fatty Necrosis"   PFT's  2014   NORMAL   REVERSE SHOULDER ARTHROPLASTY Right 03/11/2019   Procedure: REVERSE TOTAL SHOULDER ARTHROPLASTY;  Surgeon: Beverely Low, MD;   Location: [facility];  Service: Orthopedics;  Laterality: Right;  Intersclene block   ROTATOR CUFF REPAIR Left 10/02/14   Dr. Ranell Patrick   TONSILLECTOMY     TOTAL KNEE ARTHROPLASTY  09/2009   Right ;Dr Charlann Boxer   TOTAL KNEE ARTHROPLASTY Left 03/10/2016   Procedure: TOTAL KNEE ARTHROPLASTY;  Surgeon: Durene Romans, MD;  Location: [facility];  Service: Orthopedics;  Laterality: Left;   TRANSTHORACIC ECHOCARDIOGRAM  09/2012; 09/27/15   2014: EF 60-65%, Gr 1 DD, mild AI, mild bileaflet MVP, mod post directed MR, mild LAE, PASP 35.  Repeat 2017: EF 55-60%, mild AR, mild MVP and mild MV regurg.   VAGINAL HYSTERECTOMY     For Uterine Deviation      Current Outpatient Medications:    apixaban (ELIQUIS) 5 MG TABS tablet, Take 1 tablet (5 mg total) by mouth 2 (two) times daily., Disp: 180 tablet, Rfl: 3   betamethasone dipropionate 0.05 % lotion, , Disp: , Rfl:    Biotin 5000 MCG CAPS, Take 5,000 mcg by mouth daily., Disp: , Rfl:    Calcium Carbonate (CALCIUM 600 PO), Take 600 mg by mouth 2 (two) times daily. , Disp: , Rfl:    carbidopa-levodopa (SINEMET CR) 50-200 MG tablet, Take 1 tablet by mouth at bedtime., Disp: 90 tablet, Rfl: 1   carbidopa-levodopa (SINEMET IR) 25-100 MG tablet, Take 2 tablets by mouth 3 (three) times daily. 8am/noon/4pm, Disp: 540 tablet, Rfl: 1   cyanocobalamin (VITAMIN B12) 1000 MCG/ML injection, INJECT 1 ML (1,000 MCG) INTRAMUSCULARLY EVERY 30 DAYS, Disp: 10 mL, Rfl: 3   flecainide (TAMBOCOR) 100 MG tablet, Take 1 tablet (100 mg total) by mouth 2 (two) times daily. Please keep upcoming appointment with Dr [name] for any further refills., Disp: 180 tablet, Rfl: 3   fluticasone (FLONASE) 50 MCG/ACT nasal spray, Place 2 sprays into both nostrils daily., Disp: 48 g, Rfl: 3   gabapentin (NEURONTIN) 300 MG capsule, TAKE 2 CAPSULES BY MOUTH TWICE A DAY, Disp: 360 capsule, Rfl: 1   metoprolol tartrate (LOPRESSOR) 25 MG tablet, Take 1 tablet (25 mg total) by mouth 2 (two) times daily., Disp: 180 tablet,  Rfl: 3   nitroGLYCERIN (NITROSTAT) 0.4 MG SL tablet, Place 1 tablet (0.4 mg total) under the tongue every 5 (five) minutes as needed for chest pain., Disp: 15 tablet, Rfl: 1   Omega-3 Fatty Acids (FISH OIL) 1200 MG CAPS, Take 1,000 mg by mouth 2 (two) times daily. , Disp: , Rfl:    pravastatin (PRAVACHOL) 40 MG tablet, Take 1 tablet (40 mg total) by mouth daily., Disp: 90 tablet, Rfl: 3   raloxifene (EVISTA) 60 MG tablet, TAKE 1 TABLET BY MOUTH EVERY DAY, Disp: 90 tablet, Rfl: 1   traZODone (  DESYREL) 50 MG tablet, TAKE 1 TABLET BY MOUTH EVERYDAY AT BEDTIME, Disp: 90 tablet, Rfl: 1  Allergies  Allergen Reactions   Sulfa Antibiotics Swelling    face   Tizanidine Other (See Comments)    delirium          Objective:  Physical Exam  General: AAO x3, NAD  Dermatological: Hyperkeratotic tissue noted along the distal aspect of right fourth toe as well as submetatarsal 2 left foot.  There is no underlying ulceration drainage or any signs of infection but there was some dried blood in the lesions are preulcerative.  There is no surrounding erythema, ascending cellulitis. Nails are hypertrophic, dystrophic, brittle, discolored, elongated 10. No surrounding redness or drainage. Tenderness nails 1-5 bilaterally.   Vascular: Dorsalis Pedis artery and Posterior Tibial artery pedal pulses are 2/4 bilateral with immedate capillary fill time.  There is no pain with calf compression, swelling, warmth, erythema.   Neruologic: Grossly intact via light touch bilateral.   Musculoskeletal: Hammertoes are present.  On the right fourth toe with a callus is present that is flexible but the right second toe is more rigid.  There is prominent metatarsal heads plantarly with atrophy of the fat pad resulting in hyperkeratotic lesion on the left foot.      Assessment:   82 year old female with hammertoe deformity resulting hyperkeratotic lesion, prominent metatarsal heads, symptomatic onychomycosis     Plan:   -Treatment options discussed including all alternatives, risks, and complications -Etiology of symptoms were discussed -Sharply debrided the nails x 10 without any complications or bleeding. -Debrided hyperkeratotic lesions x 2 without any complications or bleeding as a courtesy.  Recommend moisturizer, offloading.  For the right fourth toe given the painful lesion.  Discussed flexor tenotomy to see if this will help take pressure.  We discussed risks of transfer lesion, infection, reoccurrence and other risks of the procedure.  She does want to proceed with this.  Will schedule this at her convenience tissue and proceed with this. -Continue offloading for the right foot submetatarsal prominent metatarsal heads.  Gel metatarsal pads dispensed.  Vivi Barrack DPM

## 2022-12-04 NOTE — Patient Instructions (Signed)
Medication Instructions:  No Changes *If you need a refill on your cardiac medications before your next appointment, please call your pharmacy*   Lab Work: No labs If you have labs (blood work) drawn today and your tests are completely normal, you will receive your results only by: MyChart Message (if you have MyChart) OR A paper copy in the mail If you have any lab test that is abnormal or we need to change your treatment, we will call you to review the results.   Testing/Procedures: No Testing   Follow-Up: At [facility], you and your health needs are our priority.  As part of our continuing mission to provide you with exceptional heart care, we have created designated Provider Care Teams.  These Care Teams include your primary Cardiologist (physician) and Advanced Practice Providers (APPs -  Physician Assistants and Nurse Practitioners) who all work together to provide you with the care you need, when you need it.  We recommend signing up for the patient portal called "MyChart".  Sign up information is provided on this After Visit Summary.  MyChart is used to connect with patients for Virtual Visits (Telemedicine).  Patients are able to view lab/test results, encounter notes, upcoming appointments, etc.  Non-urgent messages can be sent to your provider as well.   To learn more about what you can do with MyChart, go to [email or web address].    Your next appointment:   1 year(s)  Provider:   Peter [name], MD

## 2022-12-04 NOTE — Progress Notes (Signed)
Cardiology Office Note   Date:  12/04/2022   ID:  Cassie White, DOB 1941/01/03, MRN [id number]  PCP:  Jeoffrey Massed, MD  Cardiologist:  Jaythen Hamme [name], MD EP: None  Chief Complaint  Patient presents with   Atrial Fibrillation      History of Present Illness: Cassie White is a 82 y.o. female with PMH of paroxysmal atrial fibrillation, MVP with mitral regurgitation, HLD, and GERD, who presents for follow up.  She has a history of Afib managed with  flecainide. She is on Eliquis for anticoagulation.  Her last ischemic evaluation was a NST in 2017 to rule out pro arrhythmia and the ischemic heart disease, there was moderate sized and intensity partially reversible anterior and apical defect, EF was 64%, likely representing shifting breast artifact, overall it was considered intermediate risk study. I personally reviewed the images and felt this was low risk. She has no anginal symptoms.  Her last echocardiogram in 2017 showed EF 55-60%, mild AI, mild MR, with peak PA pressures 36 mmHg.  She is followed by Neurology for Parkinson's disease.   She has been doing well from a cardiac standpoint since her last visit. She is limited in activity by back pain. Ambulates with a walker. She has had no further falls. She did have recent cataract surgery.  She denies any chest pain or SOB. Has very minor palpitations.  No complaints of dizziness, lightheadedness, syncope, LE edema, orthopnea, or PND. No bleeding on Eliquis    Past Medical History:  Diagnosis Date   Abnormal mammogram of left breast    Likely benign microcalcifications--repeat L diag mammo 03/24/2017.  COMPLEX SCLEROSING LESION WITH CALCIFICATIONS --no sign of malignancy.  Excision recommended--pt has been referred to Dr. Dwain Sarna, who recommended f/u mammo and this was normal 12/2017--consider rpt 1 yr per rad.   Arthritis    DJD, back and both hips   Chronic low back pain without sciatica    Lumbar spondylosis +  scoliosis.  Summer 2017 Dr. Ethelene Hal did ESI and pt got no relief.  Pt then saw Dr. Retia Passe [facility]: felt she had facet mediated pain; plan for B L345 MBB (??).   COVID 08/2022   Cystocele, midline    Diverticulosis    on colonoscopy 2004   GERD (gastroesophageal reflux disease)    Hx of cardiovascular stress test    Lexiscan Myoview (2/14):  Low risk, small ant defect likely breast attenuation, no ischemia, EF 69% (no change from 2010).     Hyperlipidemia    Hypertension    per pt   Idiopathic scoliosis of thoracolumbar region    Lumbar back pain    Mitral regurgitation    Echo (2/14):  EF 60-65%, Gr 1 DD, mild AI, mild bileaflet MVP, mod post directed MR, mild LAE, PASP 35.   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  Responding to gabapentin (Dr. Vickey Huger).   Osteoporosis, senile    stable left hip 2015 (took fosamax x 39yrs, T-score -2.7 when she stopped bisphosphonate).  Rpt DEXA 13mo later (09/2016) T-score -.3.1 (different machine, though). 11/2019 DEXA T-score -4. Recommended restart of fosamax   PAF (paroxysmal atrial fibrillation) (HCC)    Dr. [name]: Flecainide, metopr, apxiaban.  08/2017-->persistent a-fib w/RVR, flecainide increased and she converted back to NSR and felt better.  100mg  bid flecainide continued.   Parkinson's disease 2020 dx   started sinemet 05/2019 (  Dr. Arbutus Leas)   Peripheral neuropathy 2008   Idiopathic vs familial (motor>sensory) vs lumbar disc dz: gabapentin helpful   Shoulder pain left   RC surg 09/2014    Past Surgical History:  Procedure Laterality Date   BREAST BIOPSY  04/10/2017   Left breast core needle biopsy of calcifications.  COMPLEX SCLEROSING LESION WITH CALCIFICATIONS --no sign of malignancy.  Excision recommended--pt has been referred to Dr. Dwain Sarna, who recommended rpt mammo and this was NORMAL 12/2017.   BREAST EXCISIONAL BIOPSY Right 2010   Benign    BREAST SURGERY  Nov 2010   Benign biopsy, right   CHOLECYSTECTOMY  03/2010   Dr.Gross   COLONOSCOPY  10/03/2002   No polyps.  Repeat 10 yrs recommended but pt declines.  Pt declined cologuard 08/2016 but changed her mind and this test was NEG on 03/23/17.   COMBINED HYSTERECTOMY VAGINAL / OOPHORECTOMY / A&P REPAIR  1990   uncertain if ovaries were removed or not   CYSTOCELE REPAIR     DEXA  11/2019   T score -4   Lumpectomy  06/2009   "Fatty Necrosis"   PFT's  2014   NORMAL   REVERSE SHOULDER ARTHROPLASTY Right 03/11/2019   Procedure: REVERSE TOTAL SHOULDER ARTHROPLASTY;  Surgeon: Beverely Low, MD;  Location: [facility];  Service: Orthopedics;  Laterality: Right;  Intersclene block   ROTATOR CUFF REPAIR Left 10/02/14   Dr. Ranell Patrick   TONSILLECTOMY     TOTAL KNEE ARTHROPLASTY  09/2009   Right ;Dr Charlann Boxer   TOTAL KNEE ARTHROPLASTY Left 03/10/2016   Procedure: TOTAL KNEE ARTHROPLASTY;  Surgeon: Durene Romans, MD;  Location: [facility];  Service: Orthopedics;  Laterality: Left;   TRANSTHORACIC ECHOCARDIOGRAM  09/2012; 09/27/15   2014: EF 60-65%, Gr 1 DD, mild AI, mild bileaflet MVP, mod post directed MR, mild LAE, PASP 35.  Repeat 2017: EF 55-60%, mild AR, mild MVP and mild MV regurg.   VAGINAL HYSTERECTOMY     For Uterine Deviation      Current Outpatient Medications  Medication Sig Dispense Refill   betamethasone dipropionate 0.05 % lotion      Biotin 5000 MCG CAPS Take 5,000 mcg by mouth daily.     Calcium Carbonate (CALCIUM 600 PO) Take 600 mg by mouth 2 (two) times daily.      carbidopa-levodopa (SINEMET CR) 50-200 MG tablet Take 1 tablet by mouth at bedtime. 90 tablet 1   carbidopa-levodopa (SINEMET IR) 25-100 MG tablet Take 2 tablets by mouth 3 (three) times daily. 8am/noon/4pm 540 tablet 1   cyanocobalamin (VITAMIN B12) 1000 MCG/ML injection INJECT 1 ML (1,000 MCG) INTRAMUSCULARLY EVERY 30 DAYS 10 mL 3   fluticasone (FLONASE) 50 MCG/ACT nasal spray Place 2 sprays into both nostrils daily. 48  g 3   gabapentin (NEURONTIN) 300 MG capsule TAKE 2 CAPSULES BY MOUTH TWICE A DAY 360 capsule 1   metoprolol tartrate (LOPRESSOR) 25 MG tablet Take 1 tablet (25 mg total) by mouth 2 (two) times daily. 180 tablet 3   nitroGLYCERIN (NITROSTAT) 0.4 MG SL tablet Place 1 tablet (0.4 mg total) under the tongue every 5 (five) minutes as needed for chest pain. 15 tablet 1   Omega-3 Fatty Acids (FISH OIL) 1200 MG CAPS Take 1,000 mg by mouth 2 (two) times daily.      raloxifene (EVISTA) 60 MG tablet TAKE 1 TABLET BY MOUTH EVERY DAY 90 tablet 1   traZODone (DESYREL) 50 MG tablet TAKE 1 TABLET BY MOUTH EVERYDAY AT BEDTIME 90 tablet  1   apixaban (ELIQUIS) 5 MG TABS tablet Take 1 tablet (5 mg total) by mouth 2 (two) times daily. 180 tablet 3   flecainide (TAMBOCOR) 100 MG tablet Take 1 tablet (100 mg total) by mouth 2 (two) times daily. Please keep upcoming appointment with Dr [name] for any further refills. 180 tablet 3   pravastatin (PRAVACHOL) 40 MG tablet Take 1 tablet (40 mg total) by mouth daily. 90 tablet 3   No current facility-administered medications for this visit.    Allergies:   Sulfa antibiotics and Tizanidine    Social History:  The patient  reports that she has never smoked. She has never used smokeless tobacco. She reports that she does not drink alcohol and does not use drugs.   Family History:  The patient's family history includes COPD in her brother; Coronary artery disease in her brother; Healthy in her daughter and son; Heart attack in her father; Heart attack (age of onset: 35) in her son; Ovarian cancer in her mother; Stroke (age of onset: 87) in her father.    ROS:  Please see the history of present illness.   Otherwise, review of systems are positive for none.   All other systems are reviewed and negative.    PHYSICAL EXAM: VS:  BP (!) 132/50 (BP Location: Left Arm, Patient Position: Sitting, Cuff Size: Normal)   Pulse 62   Ht 5\' 1"  (1.549 m)   Wt 142 lb (64.4 kg)   BMI  26.83 kg/m  , BMI Body mass index is 26.83 kg/m. GEN: Well nourished, well developed, in no acute distress HEENT: normal Neck: no JVD, carotid bruits, or masses Cardiac: bradycardic, regular rhythm, no murmurs, rubs, or gallops, no edema  Respiratory:  clear to auscultation bilaterally, normal work of breathing GI: soft, nontender, nondistended, + BS MS: no deformity or atrophy Skin: warm and dry, no rash Neuro:  Strength and sensation are intact Psych: euthymic mood, full affect   EKG:  EKG is  ordered today. NSR rate 55.  First degree AV block.otherwise normal.  I have personally reviewed and interpreted this study.    Recent Labs: 06/02/2022: TSH 0.80 09/08/2022: ALT 4; BUN 15; Creatinine, Ser 0.76; Hemoglobin 13.2; Platelets 174.0; Potassium 4.3; Sodium 139    Lipid Panel    Component Value Date/Time   CHOL 174 09/08/2022 1348   CHOL 211 (H) 04/28/2014 1026   TRIG 73.0 09/08/2022 1348   TRIG 95 04/28/2014 1026   HDL 72.30 09/08/2022 1348   HDL 101 04/28/2014 1026   CHOLHDL 2 09/08/2022 1348   VLDL 14.6 09/08/2022 1348   LDLCALC 87 09/08/2022 1348   LDLCALC 91 04/28/2014 1026   LDLDIRECT 87.5 09/07/2012 1022      Wt Readings from Last 3 Encounters:  12/04/22 142 lb (64.4 kg)  11/17/22 142 lb 12.8 oz (64.8 kg)  10/23/22 145 lb 6.4 oz (66 kg)      Other studies Reviewed: Additional studies/ records that were reviewed today include:     Echo 09/27/2015 LV EF: 55% -   60%   Study Conclusions   - Left ventricle: The cavity size was normal. There was mild   concentric hypertrophy. Systolic function was normal. The   estimated ejection fraction was in the range of 55% to 60%. - Aortic valve: There was mild regurgitation. - Mitral valve: Mild anterior leafelt prolapse. There was mild   regurgitation. - Atrial septum: No defect or patent foramen ovale was identified. - Pulmonary arteries: PA  peak pressure: 36 mm Hg (S).     Myoview 10/18/2015 Study Highlights     The left ventricular ejection fraction is moderately decreased (30-44%). Nuclear stress EF: 64%. There was no ST segment deviation noted during stress. No T wave inversion was noted during stress. Defect 1: There is a medium defect of moderate severity. This is an intermediate risk study. Findings consistent with prior myocardial infarction with peri-infarct ischemia.   Moderate size and intensity partially reversible anterior and apical wall defect (SDS 4). LVEF 64% with normal wall motion. This likely represents shifting breast artifact given normal wall motion and LV function. Clinical correlation is advised. This is an intermediate risk study.       ASSESSMENT AND PLAN:  1.  Paroxysmal atrial fibrillation: well controlled on Flecainide. No complaints of bleeding on eliquis - Continue flecainide for rhythm control - Continue metoprolol for rate control - Continue eliquis for stroke ppx - labs in Feb were ok.   2. HLD: LDL 87 - Continue pravastatin and omega 3  3. MVP with mitral regurgitation: mild MR on echo,  - exam is stable.  - Continue to monitor for now   Current medicines are reviewed at length with the patient today.  The patient does not have concerns regarding medicines.  The following changes have been made:  As above  Labs/ tests ordered today include:   Orders Placed This Encounter  Procedures   EKG 12-Lead     Disposition:   FU with Dr. [name] in one year  Signed, Destyne Goodreau [name], MD  12/04/2022 4:18 PM

## 2022-12-15 ENCOUNTER — Ambulatory Visit: Payer: Medicare Other | Admitting: Podiatry

## 2022-12-17 DIAGNOSIS — N8111 Cystocele, midline: Secondary | ICD-10-CM | POA: Diagnosis not present

## 2022-12-18 ENCOUNTER — Other Ambulatory Visit: Payer: Self-pay | Admitting: Family Medicine

## 2022-12-18 DIAGNOSIS — R0789 Other chest pain: Secondary | ICD-10-CM

## 2022-12-19 ENCOUNTER — Other Ambulatory Visit: Payer: Self-pay | Admitting: Family Medicine

## 2022-12-19 DIAGNOSIS — Z1231 Encounter for screening mammogram for malignant neoplasm of breast: Secondary | ICD-10-CM

## 2022-12-23 ENCOUNTER — Telehealth: Payer: Self-pay | Admitting: Neurology

## 2022-12-23 NOTE — Telephone Encounter (Signed)
Sent in 1 3 month prescription for 1 pill QHS with one refill on 08-22-22. Called daughter to see how often patient is taking this medication because she should have plenty

## 2022-12-23 NOTE — Telephone Encounter (Signed)
Patient's daughter returned call. She states patient was advised by Dr. Arbutus Leas that she could increase her Trazodone PRN so she has been taking 2 tablets at night, this is why she is out of medication.

## 2022-12-23 NOTE — Telephone Encounter (Signed)
Pt's daughter called in and left a message. She stated the pt is out of her Trazodone before the pharmacy will fill it. She is wondering if an updated prescription could be sent in for the higher frequency?

## 2022-12-24 ENCOUNTER — Ambulatory Visit: Payer: Medicare Other

## 2022-12-24 NOTE — Telephone Encounter (Signed)
Called patients daughter back and let her know Dr. Arbutus Leas did not recommend that she take her Trazodone PRN and she had recommended the patient see Urology. Patients daughter understood and is reaching out to PCP for help with patients sleep

## 2022-12-26 ENCOUNTER — Ambulatory Visit: Payer: Medicare Other | Admitting: Podiatry

## 2023-01-13 ENCOUNTER — Ambulatory Visit
Admission: RE | Admit: 2023-01-13 | Discharge: 2023-01-13 | Disposition: A | Discharge status: Home / Self Care | Payer: Medicare Other | Source: Ambulatory Visit | Attending: Family Medicine | Admitting: Family Medicine

## 2023-01-13 ENCOUNTER — Other Ambulatory Visit: Payer: Self-pay | Admitting: Neurology

## 2023-01-13 DIAGNOSIS — G2581 Restless legs syndrome: Secondary | ICD-10-CM

## 2023-01-13 DIAGNOSIS — Z1231 Encounter for screening mammogram for malignant neoplasm of breast: Secondary | ICD-10-CM | POA: Diagnosis not present

## 2023-01-13 DIAGNOSIS — G20A1 Parkinson's disease without dyskinesia, without mention of fluctuations: Secondary | ICD-10-CM

## 2023-01-16 ENCOUNTER — Other Ambulatory Visit: Payer: Self-pay | Admitting: Family Medicine

## 2023-01-16 DIAGNOSIS — R928 Other abnormal and inconclusive findings on diagnostic imaging of breast: Secondary | ICD-10-CM

## 2023-01-19 ENCOUNTER — Ambulatory Visit: Payer: Medicare Other | Admitting: Podiatry

## 2023-01-19 DIAGNOSIS — M79671 Pain in right foot: Secondary | ICD-10-CM

## 2023-01-19 DIAGNOSIS — L84 Corns and callosities: Secondary | ICD-10-CM

## 2023-01-19 DIAGNOSIS — M2042 Other hammer toe(s) (acquired), left foot: Secondary | ICD-10-CM

## 2023-01-19 DIAGNOSIS — M2041 Other hammer toe(s) (acquired), right foot: Secondary | ICD-10-CM

## 2023-01-19 NOTE — Progress Notes (Signed)
Subjective: Chief Complaint  Patient presents with   Hammer Toe    Right foot hammer toe    82 year old female presents the office today for follow-up evaluation of a painful corn right fourth toe and for possible flexor tenotomy.  She states that her toes all cause discomfort and she also gets pain to her right second hammertoe.  No recent injury or changes otherwise.  Objective: AAO x3, NAD DP/PT pulses palpable bilaterally, CRT less than 3 seconds Images are present on the right foot.  Rigid hammertoe contracture present right second toe and flexible contracture is noted of the third, fourth, fifth.  At the distal aspect the right fourth toe is a painful hyperkeratotic lesion without any underlying ulceration drainage or signs of infection.  No ulcerations are present. 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  -Upon initial evaluation of the patient she thought she was coming in for flexor tenotomy of the right second toe.  Given the rigidity of this we cannot perform a tenotomy.  If she wants to proceed with surgery for that we can do an arthroplasty/arthrodesis of the PIPJ.  Initially she did not want to proceed with a flexor tenotomy of the right fourth toe.  I did debride the callus with any complications or bleeding dispensed offloading pads.  After she left her daughter came back into the office with concerns and questions why I did not do the procedure.  I explained that the patient did not want to proceed with it.  I again discussed the procedure, postoperative course of the flexor tenotomy on the right fourth toe and if she were to proceed with surgery in the second toe we will need to schedule that separately has to be done at outpatient surgical center.  After he left the second time they did  come back stating they want to proceed with a flexor tenotomy of the fourth toe.  I again discussed the fourth toe as well with the flexor tenotomy and all alternatives, risks, complications.  No promises or guarantees given and she wishes to proceed with a flexor tenotomy ultimately of the right fourth toe. -I cleaned the skin with alcohol.  Mixture of 3 mL of lidocaine, Marcaine plain was infiltrated in a digital block fashion.  Once anesthetized I prepped the toe with Betadine.  I then introduced an 18-gauge needle the plantar aspect of the toe noted to transect the flexor tendon and the toe was sitting in a rectus position.  I irrigated with alcohol.  Antibiotic ointment and a bandage applied.  She tolerated procedure well any complications.  Postprocedure instructions discussed. -Monitor for any clinical signs or symptoms of infection and directed to call the office immediately should any occur or go to the ER. -Patient encouraged to call the office with any questions, concerns, change in symptoms.   Vivi Barrack DPM

## 2023-01-27 ENCOUNTER — Ambulatory Visit
Admission: RE | Admit: 2023-01-27 | Discharge: 2023-01-27 | Disposition: A | Discharge status: Home / Self Care | Payer: Medicare Other | Source: Ambulatory Visit | Attending: Family Medicine | Admitting: Family Medicine

## 2023-01-27 ENCOUNTER — Ambulatory Visit: Payer: Medicare Other

## 2023-01-27 DIAGNOSIS — R928 Other abnormal and inconclusive findings on diagnostic imaging of breast: Secondary | ICD-10-CM | POA: Diagnosis not present

## 2023-02-02 ENCOUNTER — Telehealth: Payer: Self-pay | Admitting: Neurology

## 2023-02-02 NOTE — Telephone Encounter (Signed)
Pt daughter wants to know if there are any handouts for home exercise for patient

## 2023-02-03 NOTE — Telephone Encounter (Signed)
Called patients daughter and sent email with links Told daughter to call us if she wants to do home PT

## 2023-02-12 ENCOUNTER — Ambulatory Visit: Payer: Medicare Other | Admitting: Podiatry

## 2023-02-13 ENCOUNTER — Ambulatory Visit (INDEPENDENT_AMBULATORY_CARE_PROVIDER_SITE_OTHER): Payer: Medicare Other | Admitting: Podiatry

## 2023-02-13 DIAGNOSIS — L84 Corns and callosities: Secondary | ICD-10-CM

## 2023-02-13 DIAGNOSIS — M2041 Other hammer toe(s) (acquired), right foot: Secondary | ICD-10-CM

## 2023-02-13 DIAGNOSIS — M2042 Other hammer toe(s) (acquired), left foot: Secondary | ICD-10-CM

## 2023-02-21 NOTE — Progress Notes (Signed)
Subjective: Chief Complaint  Patient presents with   Follow-up    f/u for right 4th flexor tenotomy. Patient denies any pain to 4th toe. She is doing well.    Nail Problem    Pain to 3rd toenail on right foot.    82 year old female presents with above concerns.  She said that she has been doing very well since having a flexor tenotomy done on her right fourth toe that the pain she was having prior has resolved. Some discomfort of the right third toenail.  New open lesions, injuries or other concerns today.   Objective: AAO x3, NAD DP/PT pulses palpable bilaterally, CRT less than 3 seconds He has a corn on the right fourth toe is resolved and there is no pain.  The toe is more rectus.  Hammertoes noted the other digits. Toenail is mildly warm.  No edema, erythema or signs of infection..  No pain with calf compression, swelling, warmth, erythema  Assessment: 82 year old female status post flexor tenotomy with hammertoe deformities  Plan: -All treatment options discussed with the patient including all alternatives, risks, complications.  -Through the nailbed and complications of bleeding.  If she were to get symptoms to the third toe from the hammertoe we can try a flexor tenotomy.  The patient should concern about her second toe but she would need to undergo hammertoe, PIPJ arthrodesis and after discussion with hold off on that surgery at this time.  Continue offloading.  Discussed the modifications avoid excess pressure. -Patient encouraged to call the office with any questions, concerns, change in symptoms.   Vivi Barrack DPM

## 2023-03-09 ENCOUNTER — Encounter: Payer: Self-pay | Admitting: Family Medicine

## 2023-03-09 ENCOUNTER — Ambulatory Visit (INDEPENDENT_AMBULATORY_CARE_PROVIDER_SITE_OTHER): Payer: Medicare Other | Admitting: Family Medicine

## 2023-03-09 VITALS — BP 101/61 | HR 65 | Wt 140.2 lb

## 2023-03-09 DIAGNOSIS — I480 Paroxysmal atrial fibrillation: Secondary | ICD-10-CM

## 2023-03-09 DIAGNOSIS — I10 Essential (primary) hypertension: Secondary | ICD-10-CM | POA: Diagnosis not present

## 2023-03-09 NOTE — Progress Notes (Signed)
OFFICE VISIT  03/09/2023  CC:  Chief Complaint  Patient presents with   Medical Management of Chronic Issues    Patient is a 82 y.o. female who presents for 18-month follow-up hypertension and hyperlipidemia. A/P as of last visit: "1 hypertension.  Well-controlled.  Continue Lopressor 25 mg twice a day. Electrolytes and creatinine today.  2.  Hyperlipidemia.  Pravastatin 40 mg a day. LDL was 86 about 1 year ago. (Non-fasting) lipids and hepatic panel today.  3.  Osteoporosis.  October 2022 started raloxifene for her osteoporosis. Tolerating this well.  rpt DEXA 05/2023.   4. Preventative health care: Immunizations: She declines any further Tdap. Otherwise vaccines are UTD. Breast ca screening: mammogram UTD as of 07/2021-->followed by Dr. Dwain Sarna. Colon cancer screening: cologuard NEG 10/2021.  No further colon cancer screening indicated."  INTERIM HX: Doing well, no concerns.  ROS -> no fevers, no CP, no SOB, no wheezing, no cough, no dizziness, no HAs, no rashes, no melena/hematochezia.  No polyuria or polydipsia.  No myalgias or arthralgias.  No focal weakness.  No acute vision or hearing abnormalities.  No dysuria or unusual/new urinary urgency or frequency.  No recent changes in lower legs. No n/v/d or abd pain.  No palpitations.     Past Medical History:  Diagnosis Date   Abnormal mammogram of left breast    Likely benign microcalcifications--repeat L diag mammo 03/24/2017.  COMPLEX SCLEROSING LESION WITH CALCIFICATIONS --no sign of malignancy.  Excision recommended--pt has been referred to Dr. Dwain Sarna, who recommended f/u mammo and this was normal 12/2017--consider rpt 1 yr per rad.   Arthritis    DJD, back and both hips   Chronic low back pain without sciatica    Lumbar spondylosis + scoliosis.  Summer 2017 Dr. Ethelene Hal did ESI and pt got no relief.  Pt then saw Dr. Retia Passe [facility]: felt she had facet mediated pain; plan for B L345 MBB (??).   COVID  08/2022   Cystocele, midline    Diverticulosis    on colonoscopy 2004   GERD (gastroesophageal reflux disease)    Hx of cardiovascular stress test    Lexiscan Myoview (2/14):  Low risk, small ant defect likely breast attenuation, no ischemia, EF 69% (no change from 2010).     Hyperlipidemia    Hypertension    per pt   Idiopathic scoliosis of thoracolumbar region    Lumbar back pain    Mitral regurgitation    Echo (2/14):  EF 60-65%, Gr 1 DD, mild AI, mild bileaflet MVP, mod post directed MR, mild LAE, PASP 35.   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  Responding to gabapentin (Dr. Vickey Huger).   Osteoporosis, senile    stable left hip 2015 (took fosamax x 51yrs, T-score -2.7 when she stopped bisphosphonate).  Rpt DEXA 21mo later (09/2016) T-score -.3.1 (different machine, though). 11/2019 DEXA T-score -4. Recommended restart of fosamax   PAF (paroxysmal atrial fibrillation) (HCC)    Dr. [name]: Flecainide, metopr, apxiaban.  08/2017-->persistent a-fib w/RVR, flecainide increased and she converted back to NSR and felt better.  100mg  bid flecainide continued.   Parkinson's disease 2020 dx   started sinemet 05/2019 (Dr. Arbutus Leas)   Peripheral neuropathy 2008   Idiopathic vs familial (motor>sensory) vs lumbar disc dz: gabapentin helpful   Shoulder pain left   RC surg 09/2014    Past Surgical History:  Procedure Laterality Date   BREAST BIOPSY  04/10/2017   Left breast core needle biopsy of calcifications.  COMPLEX SCLEROSING LESION WITH CALCIFICATIONS --no sign of malignancy.  Excision recommended--pt has been referred to Dr. Dwain Sarna, who recommended rpt mammo and this was NORMAL 12/2017.   BREAST EXCISIONAL BIOPSY Right 2010   Benign   BREAST SURGERY  Nov 2010   Benign biopsy, right   CHOLECYSTECTOMY  03/2010   Dr.Gross   COLONOSCOPY  10/03/2002   No polyps.  Repeat 10 yrs recommended but pt declines.  Pt  declined cologuard 08/2016 but changed her mind and this test was NEG on 03/23/17.   COMBINED HYSTERECTOMY VAGINAL / OOPHORECTOMY / A&P REPAIR  1990   uncertain if ovaries were removed or not   CYSTOCELE REPAIR     DEXA  11/2019   T score -4   Lumpectomy  06/2009   "Fatty Necrosis"   PFT's  2014   NORMAL   REVERSE SHOULDER ARTHROPLASTY Right 03/11/2019   Procedure: REVERSE TOTAL SHOULDER ARTHROPLASTY;  Surgeon: Beverely Low, MD;  Location: [facility];  Service: Orthopedics;  Laterality: Right;  Intersclene block   ROTATOR CUFF REPAIR Left 10/02/14   Dr. Ranell Patrick   TONSILLECTOMY     TOTAL KNEE ARTHROPLASTY  09/2009   Right ;Dr Charlann Boxer   TOTAL KNEE ARTHROPLASTY Left 03/10/2016   Procedure: TOTAL KNEE ARTHROPLASTY;  Surgeon: Durene Romans, MD;  Location: [facility];  Service: Orthopedics;  Laterality: Left;   TRANSTHORACIC ECHOCARDIOGRAM  09/2012; 09/27/15   2014: EF 60-65%, Gr 1 DD, mild AI, mild bileaflet MVP, mod post directed MR, mild LAE, PASP 35.  Repeat 2017: EF 55-60%, mild AR, mild MVP and mild MV regurg.   VAGINAL HYSTERECTOMY     For Uterine Deviation     Outpatient Medications Prior to Visit  Medication Sig Dispense Refill   apixaban (ELIQUIS) 5 MG TABS tablet Take 1 tablet (5 mg total) by mouth 2 (two) times daily. 180 tablet 3   betamethasone dipropionate 0.05 % lotion      Biotin 5000 MCG CAPS Take 5,000 mcg by mouth daily.     Calcium Carbonate (CALCIUM 600 PO) Take 600 mg by mouth 2 (two) times daily.      carbidopa-levodopa (SINEMET CR) 50-200 MG tablet Take 1 tablet by mouth at bedtime. 90 tablet 1   carbidopa-levodopa (SINEMET IR) 25-100 MG tablet Take 2 tablets by mouth 3 (three) times daily. 8am/noon/4pm 540 tablet 1   cyanocobalamin (VITAMIN B12) 1000 MCG/ML injection INJECT 1 ML (1,000 MCG) INTRAMUSCULARLY EVERY 30 DAYS 10 mL 3   flecainide (TAMBOCOR) 100 MG tablet Take 1 tablet (100 mg total) by mouth 2 (two) times daily. Please keep upcoming appointment with Dr [name] for any  further refills. 180 tablet 3   gabapentin (NEURONTIN) 300 MG capsule TAKE 2 CAPSULES BY MOUTH TWICE A DAY 360 capsule 1   metoprolol tartrate (LOPRESSOR) 25 MG tablet Take 1 tablet (25 mg total) by mouth 2 (two) times daily. 180 tablet 3   nitroGLYCERIN (NITROSTAT) 0.4 MG SL tablet PLACE 1 TABLET UNDER THE TONGUE EVERY 5 MINUTES AS NEEDED FOR CHEST PAIN. 25 tablet 1   Omega-3 Fatty Acids (FISH OIL) 1200 MG CAPS Take 1,000 mg by mouth 2 (two) times daily.      pravastatin (PRAVACHOL) 40 MG tablet Take 1 tablet (40 mg total) by mouth daily. 90 tablet 3   traZODone (DESYREL) 50 MG tablet TAKE 1 TABLET BY MOUTH EVERYDAY AT BEDTIME 90 tablet 0   fluticasone (FLONASE) 50 MCG/ACT  nasal spray Place 2 sprays into both nostrils daily. (Patient not taking: Reported on 03/09/2023) 48 g 3   raloxifene (EVISTA) 60 MG tablet TAKE 1 TABLET BY MOUTH EVERY DAY (Patient not taking: Reported on 03/09/2023) 90 tablet 1   No facility-administered medications prior to visit.    Allergies  Allergen Reactions   Sulfa Antibiotics Swelling    face   Tizanidine Other (See Comments)    delirium    Review of Systems As per HPI  PE:    03/09/2023    1:50 PM 12/04/2022    3:50 PM 11/17/2022    2:34 PM  Vitals with BMI  Height  5\' 1"  5\' 1"   Weight 140 lbs 3 oz 142 lbs 142 lbs 13 oz  BMI  26.84 27  Systolic 101 132 161  Diastolic 61 50 79  Pulse 65 62 58     Physical Exam  Gen: Alert, well appearing.  Patient is oriented to person, place, time, and situation. AFFECT: pleasant, lucid thought and speech. CV: RRR, no m/r/g.   LUNGS: CTA bilat, nonlabored resps, good aeration in all lung fields. EXT: no clubbing or cyanosis.  no edema.    LABS:  Last CBC Lab Results  Component Value Date   WBC 7.3 09/08/2022   HGB 13.2 09/08/2022   HCT 39.0 09/08/2022   MCV 91.8 09/08/2022   MCH 30.7 03/09/2019   RDW 13.5 09/08/2022   PLT 174.0 09/08/2022   Last metabolic panel Lab Results  Component Value Date    GLUCOSE 91 09/08/2022   NA 139 09/08/2022   K 4.3 09/08/2022   CL 105 09/08/2022   CO2 29 09/08/2022   BUN 15 09/08/2022   CREATININE 0.76 09/08/2022   GFR 73.49 09/08/2022   CALCIUM 9.2 09/08/2022   PROT 6.0 09/08/2022   ALBUMIN 3.8 09/08/2022   BILITOT 0.6 09/08/2022   ALKPHOS 59 09/08/2022   AST 18 09/08/2022   ALT 4 09/08/2022   ANIONGAP 6 03/12/2019   Last lipids Lab Results  Component Value Date   CHOL 174 09/08/2022   HDL 72.30 09/08/2022   LDLCALC 87 09/08/2022   LDLDIRECT 87.5 09/07/2012   TRIG 73.0 09/08/2022   CHOLHDL 2 09/08/2022   Last hemoglobin A1c Lab Results  Component Value Date   HGBA1C  09/19/2010    5.3 (NOTE)                                                                       According to the ADA Clinical Practice Recommendations for 2011, when HbA1c is used as a screening test:   >=6.5%   Diagnostic of Diabetes Mellitus           (if abnormal result  is confirmed)  5.7-6.4%   Increased risk of developing Diabetes Mellitus  References:Diagnosis and Classification of Diabetes Mellitus,Diabetes Care,2011,34(Suppl 1):S62-S69 and Standards of Medical Care in         Diabetes - 2011,Diabetes Care,2011,34  (Suppl 1):S11-S61.   Last thyroid functions Lab Results  Component Value Date   TSH 0.80 06/02/2022   Last vitamin D Lab Results  Component Value Date   VD25OH 80.04 04/28/2014   Last vitamin B12 and Folate Lab Results  Component Value Date  VITAMINB12 1,250 (H) 04/28/2014   IMPRESSION AND PLAN:  #1 hypertension, well-controlled on Lopressor 25 mg twice daily.  2.  Hypercholesterolemia, doing well on pravastatin 40 mg a day. Not fasting today. Repeat lipid panel in 6 months.  3.  Osteoporosis.  October 2022 started raloxifene for her osteoporosis. Tolerating this well.  rpt DEXA after 05/2023.   4. Preventative health care: Immunizations: She declines any further Tdap. Otherwise vaccines are UTD. Breast ca screening: mammogram UTD  as of 07/2021-->followed by Dr. Dwain Sarna. Colon cancer screening: cologuard NEG 10/2021.  No further colon cancer screening indicated  An After Visit Summary was printed and given to the patient.  FOLLOW UP: Return in about 6 months (around 09/09/2023) for annual CPE (fasting).  Signed:  Santiago Bumpers, MD           03/09/2023

## 2023-03-11 ENCOUNTER — Telehealth: Payer: Self-pay

## 2023-03-11 DIAGNOSIS — R7989 Other specified abnormal findings of blood chemistry: Secondary | ICD-10-CM

## 2023-03-11 NOTE — Telephone Encounter (Signed)
Noted! Thank you

## 2023-03-11 NOTE — Telephone Encounter (Signed)
Spoke to patient's sister, [name] [facility]), she is aware to test results and provider's recommendations.  She will call back to schedule lab appt.  No other questions or concerns at this time.

## 2023-03-11 NOTE — Telephone Encounter (Signed)
-----   Message from Jeoffrey Massed sent at 03/11/2023  9:49 AM EDT ----- Labs normal except kidney function is down a little bit from her baseline function. Often this is due to not drinking enough fluids.  Make sure she is drinking at least 65 ounces of clear fluids every day. Return for repeat basic metabolic panel in 2 to 3 weeks, diagnosis elevated serum creatinine. Thanks

## 2023-03-11 NOTE — Progress Notes (Signed)
Pt's daughter returned call; Annabelle Harman gave results/recommendations. Pt will call back to schedule lab app.

## 2023-03-17 ENCOUNTER — Emergency Department (HOSPITAL_BASED_OUTPATIENT_CLINIC_OR_DEPARTMENT_OTHER): Payer: Medicare Other

## 2023-03-17 ENCOUNTER — Emergency Department (HOSPITAL_BASED_OUTPATIENT_CLINIC_OR_DEPARTMENT_OTHER)
Admission: EM | Admit: 2023-03-17 | Discharge: 2023-03-17 | Disposition: A | Discharge status: Home / Self Care | Payer: Medicare Other | Attending: Emergency Medicine | Admitting: Emergency Medicine

## 2023-03-17 ENCOUNTER — Other Ambulatory Visit: Payer: Self-pay

## 2023-03-17 ENCOUNTER — Encounter (HOSPITAL_BASED_OUTPATIENT_CLINIC_OR_DEPARTMENT_OTHER): Payer: Self-pay | Admitting: *Deleted

## 2023-03-17 DIAGNOSIS — W108XXA Fall (on) (from) other stairs and steps, initial encounter: Secondary | ICD-10-CM | POA: Diagnosis not present

## 2023-03-17 DIAGNOSIS — Z8616 Personal history of COVID-19: Secondary | ICD-10-CM | POA: Insufficient documentation

## 2023-03-17 DIAGNOSIS — L218 Other seborrheic dermatitis: Secondary | ICD-10-CM | POA: Diagnosis not present

## 2023-03-17 DIAGNOSIS — Z96611 Presence of right artificial shoulder joint: Secondary | ICD-10-CM | POA: Diagnosis not present

## 2023-03-17 DIAGNOSIS — I10 Essential (primary) hypertension: Secondary | ICD-10-CM | POA: Insufficient documentation

## 2023-03-17 DIAGNOSIS — Z79899 Other long term (current) drug therapy: Secondary | ICD-10-CM | POA: Insufficient documentation

## 2023-03-17 DIAGNOSIS — M2011 Hallux valgus (acquired), right foot: Secondary | ICD-10-CM | POA: Diagnosis not present

## 2023-03-17 DIAGNOSIS — T148XXA Other injury of unspecified body region, initial encounter: Secondary | ICD-10-CM

## 2023-03-17 DIAGNOSIS — M25562 Pain in left knee: Secondary | ICD-10-CM | POA: Diagnosis not present

## 2023-03-17 DIAGNOSIS — S8002XA Contusion of left knee, initial encounter: Secondary | ICD-10-CM | POA: Insufficient documentation

## 2023-03-17 DIAGNOSIS — S61512A Laceration without foreign body of left wrist, initial encounter: Secondary | ICD-10-CM | POA: Insufficient documentation

## 2023-03-17 DIAGNOSIS — Z043 Encounter for examination and observation following other accident: Secondary | ICD-10-CM | POA: Diagnosis not present

## 2023-03-17 DIAGNOSIS — G20C Parkinsonism, unspecified: Secondary | ICD-10-CM | POA: Diagnosis not present

## 2023-03-17 DIAGNOSIS — Y92009 Unspecified place in unspecified non-institutional (private) residence as the place of occurrence of the external cause: Secondary | ICD-10-CM

## 2023-03-17 DIAGNOSIS — S199XXA Unspecified injury of neck, initial encounter: Secondary | ICD-10-CM | POA: Diagnosis not present

## 2023-03-17 DIAGNOSIS — S0990XA Unspecified injury of head, initial encounter: Secondary | ICD-10-CM | POA: Diagnosis not present

## 2023-03-17 DIAGNOSIS — S52612A Displaced fracture of left ulna styloid process, initial encounter for closed fracture: Secondary | ICD-10-CM

## 2023-03-17 DIAGNOSIS — M79674 Pain in right toe(s): Secondary | ICD-10-CM | POA: Diagnosis not present

## 2023-03-17 DIAGNOSIS — S52502A Unspecified fracture of the lower end of left radius, initial encounter for closed fracture: Secondary | ICD-10-CM

## 2023-03-17 DIAGNOSIS — S0993XA Unspecified injury of face, initial encounter: Secondary | ICD-10-CM | POA: Insufficient documentation

## 2023-03-17 DIAGNOSIS — S6992XA Unspecified injury of left wrist, hand and finger(s), initial encounter: Secondary | ICD-10-CM | POA: Diagnosis present

## 2023-03-17 DIAGNOSIS — M19032 Primary osteoarthritis, left wrist: Secondary | ICD-10-CM | POA: Diagnosis not present

## 2023-03-17 DIAGNOSIS — M7981 Nontraumatic hematoma of soft tissue: Secondary | ICD-10-CM | POA: Diagnosis not present

## 2023-03-17 DIAGNOSIS — Z96652 Presence of left artificial knee joint: Secondary | ICD-10-CM | POA: Diagnosis not present

## 2023-03-17 DIAGNOSIS — M25462 Effusion, left knee: Secondary | ICD-10-CM | POA: Diagnosis not present

## 2023-03-17 DIAGNOSIS — S80211A Abrasion, right knee, initial encounter: Secondary | ICD-10-CM | POA: Diagnosis not present

## 2023-03-17 DIAGNOSIS — I700 Atherosclerosis of aorta: Secondary | ICD-10-CM | POA: Diagnosis not present

## 2023-03-17 DIAGNOSIS — L570 Actinic keratosis: Secondary | ICD-10-CM | POA: Diagnosis not present

## 2023-03-17 DIAGNOSIS — S9031XA Contusion of right foot, initial encounter: Secondary | ICD-10-CM | POA: Diagnosis not present

## 2023-03-17 DIAGNOSIS — M25532 Pain in left wrist: Secondary | ICD-10-CM | POA: Diagnosis not present

## 2023-03-17 DIAGNOSIS — Z7901 Long term (current) use of anticoagulants: Secondary | ICD-10-CM | POA: Insufficient documentation

## 2023-03-17 LAB — COMPREHENSIVE METABOLIC PANEL
ALT: 6 U/L (ref 0–44)
AST: 18 U/L (ref 15–41)
Albumin: 3.7 g/dL (ref 3.5–5.0)
Alkaline Phosphatase: 64 U/L (ref 38–126)
Anion gap: 7 (ref 5–15)
BUN: 20 mg/dL (ref 8–23)
CO2: 28 mmol/L (ref 22–32)
Calcium: 9.3 mg/dL (ref 8.9–10.3)
Chloride: 104 mmol/L (ref 98–111)
Creatinine, Ser: 0.81 mg/dL (ref 0.44–1.00)
GFR, Estimated: >60 mL/min (ref >60)
Glucose, Bld: 138 mg/dL — ABNORMAL HIGH (ref 70–99)
Potassium: 3.9 mmol/L (ref 3.5–5.1)
Sodium: 139 mmol/L (ref 135–145)
Total Bilirubin: 0.4 mg/dL (ref 0.3–1.2)
Total Protein: 6.6 g/dL (ref 6.5–8.1)

## 2023-03-17 LAB — CBC WITH DIFFERENTIAL/PLATELET
Abs Immature Granulocytes: 0.02 10*3/uL (ref 0.00–0.07)
Basophils Absolute: 0 10*3/uL (ref 0.0–0.1)
Basophils Relative: 1 %
Eosinophils Absolute: 0 10*3/uL (ref 0.0–0.5)
Eosinophils Relative: 0 %
HCT: 40.7 % (ref 36.0–46.0)
Hemoglobin: 13.4 g/dL (ref 12.0–15.0)
Immature Granulocytes: 0 %
Lymphocytes Relative: 22 %
Lymphs Abs: 1.3 10*3/uL (ref 0.7–4.0)
MCH: 30.9 pg (ref 26.0–34.0)
MCHC: 32.9 g/dL (ref 30.0–36.0)
MCV: 94 fL (ref 80.0–100.0)
Monocytes Absolute: 0.6 10*3/uL (ref 0.1–1.0)
Monocytes Relative: 11 %
Neutro Abs: 4.1 10*3/uL (ref 1.7–7.7)
Neutrophils Relative %: 66 %
Platelets: 146 10*3/uL — ABNORMAL LOW (ref 150–400)
RBC: 4.33 MIL/uL (ref 3.87–5.11)
RDW: 13.2 % (ref 11.5–15.5)
WBC: 6.1 10*3/uL (ref 4.0–10.5)
nRBC: 0 % (ref 0.0–0.2)

## 2023-03-17 MED ORDER — MORPHINE SULFATE (PF) 4 MG/ML IV SOLN
4.0000 mg | Freq: Once | INTRAVENOUS | Status: AC
  Administered 2023-03-17: 4 mg via INTRAVENOUS
  Filled 2023-03-17: qty 1

## 2023-03-17 MED ORDER — OXYCODONE HCL 5 MG PO TABS
5.0000 mg | ORAL_TABLET | Freq: Once | ORAL | Status: AC
  Administered 2023-03-17: 5 mg via ORAL
  Filled 2023-03-17: qty 1

## 2023-03-17 MED ORDER — OXYCODONE HCL 5 MG PO TABS
5.0000 mg | ORAL_TABLET | Freq: Four times a day (QID) | ORAL | 0 refills | Status: AC | PRN
Start: 2023-03-17 — End: 2023-03-22

## 2023-03-17 NOTE — ED Provider Notes (Signed)
[place] [facility] Provider Note   CSN: [id number] Arrival date & time: 03/17/23  1634     History  Chief Complaint  Patient presents with   Cassie White is a 82 y.o. female with PMH as listed below who presents BIB family from home s/p fall, takes eliquis. She states she was going up the stairs and "got tangled up with her walker" and has parkinson's disease and fell. She fell flat onto her face, and has pain to upper lip, L wrist, L knee. C/o worst pain to L knee w/ swelling. Otherwise has been in her normal state of health. No recent illnesses/fever. Denies LOC, NV. Uses rolling walker at baseline.    Past Medical History:  Diagnosis Date   Abnormal mammogram of left breast    Likely benign microcalcifications--repeat L diag mammo 03/24/2017.  COMPLEX SCLEROSING LESION WITH CALCIFICATIONS --no sign of malignancy.  Excision recommended--pt has been referred to Dr. Dwain Sarna, who recommended f/u mammo and this was normal 12/2017--consider rpt 1 yr per rad.   Arthritis    DJD, back and both hips   Chronic low back pain without sciatica    Lumbar spondylosis + scoliosis.  Summer 2017 Dr. Ethelene Hal did ESI and pt got no relief.  Pt then saw Dr. Retia Passe [facility]: felt she had facet mediated pain; plan for B L345 MBB (??).   COVID 08/2022   Cystocele, midline    Diverticulosis    on colonoscopy 2004   GERD (gastroesophageal reflux disease)    Hx of cardiovascular stress test    Lexiscan Myoview (2/14):  Low risk, small ant defect likely breast attenuation, no ischemia, EF 69% (no change from 2010).     Hyperlipidemia    Hypertension    per pt   Idiopathic scoliosis of thoracolumbar region    Lumbar back pain    Mitral regurgitation    Echo (2/14):  EF 60-65%, Gr 1 DD, mild AI, mild bileaflet MVP, mod post directed MR, mild LAE, PASP 35.   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  Responding to gabapentin (Dr. Vickey Huger).   Osteoporosis, senile    stable left hip 2015 (took fosamax x 16yrs, T-score -2.7 when she stopped bisphosphonate).  Rpt DEXA 82mo later (09/2016) T-score -.3.1 (different machine, though). 11/2019 DEXA T-score -4. Recommended restart of fosamax   PAF (paroxysmal atrial fibrillation) (HCC)    Dr. [name]: Flecainide, metopr, apxiaban.  08/2017-->persistent a-fib w/RVR, flecainide increased and she converted back to NSR and felt better.  100mg  bid flecainide continued.   Parkinson's disease 2020 dx   started sinemet 05/2019 (Dr. Arbutus Leas)   Peripheral neuropathy 2008   Idiopathic vs familial (motor>sensory) vs lumbar disc dz: gabapentin helpful   Shoulder pain left   RC surg 09/2014       Home Medications Prior to Admission medications   Medication Sig Start Date End Date Taking? Authorizing Provider  apixaban (ELIQUIS) 5 MG TABS tablet Take 1 tablet (5 mg total) by mouth 2 (two) times daily. 12/04/22   [name], Peter M, MD  betamethasone dipropionate 0.05 % lotion  02/26/22   [provider]  Biotin 5000 MCG CAPS Take 5,000 mcg by mouth daily.    [provider]  Calcium Carbonate (CALCIUM 600 PO) Take 600 mg by mouth 2 (two) times daily.     [provider]  carbidopa-levodopa (SINEMET CR) 50-200 MG tablet Take 1 tablet by mouth at bedtime. 10/23/22   Tat, Octaviano Batty, DO  carbidopa-levodopa (SINEMET IR) 25-100 MG tablet Take 2 tablets by mouth 3 (three) times daily. 8am/noon/4pm 10/23/22   Tat, Octaviano Batty, DO  cyanocobalamin (VITAMIN B12) 1000 MCG/ML injection INJECT 1 ML (1,000 MCG) INTRAMUSCULARLY EVERY 30 DAYS 09/08/22   [name], Maryjean Morn, MD  flecainide (TAMBOCOR) 100 MG tablet Take 1 tablet (100 mg total) by mouth 2 (two) times daily. Please keep upcoming appointment with Dr [name] for any further refills. 12/04/22   [name], Peter M, MD  fluticasone [facility]) 50 MCG/ACT nasal spray Place 2 sprays  into both nostrils daily. Patient not taking: Reported on 03/09/2023 09/08/22   Jeoffrey Massed, MD  gabapentin (NEURONTIN) 300 MG capsule TAKE 2 CAPSULES BY MOUTH TWICE A DAY 08/22/22   [name], Maryjean Morn, MD  metoprolol tartrate (LOPRESSOR) 25 MG tablet Take 1 tablet (25 mg total) by mouth 2 (two) times daily. 09/08/22   [name], Maryjean Morn, MD  nitroGLYCERIN (NITROSTAT) 0.4 MG SL tablet PLACE 1 TABLET UNDER THE TONGUE EVERY 5 MINUTES AS NEEDED FOR CHEST PAIN. 12/18/22   [name], Maryjean Morn, MD  Omega-3 Fatty Acids (FISH OIL) 1200 MG CAPS Take 1,000 mg by mouth 2 (two) times daily.     [provider]  pravastatin (PRAVACHOL) 40 MG tablet Take 1 tablet (40 mg total) by mouth daily. 12/04/22   [name], Peter M, MD  raloxifene (EVISTA) 60 MG tablet TAKE 1 TABLET BY MOUTH EVERY DAY Patient not taking: Reported on 03/09/2023 08/22/22   Jeoffrey Massed, MD  traZODone (DESYREL) 50 MG tablet TAKE 1 TABLET BY MOUTH EVERYDAY AT BEDTIME 01/13/23   Tat, Octaviano Batty, DO      Allergies    Sulfa antibiotics and Tizanidine    Review of Systems   Review of Systems A 10 point review of systems was performed and is negative unless otherwise reported in HPI.  Physical Exam Updated Vital Signs BP 120/89   Pulse 63   Temp 98 F (36.7 C)   Resp 15   SpO2 92%  Physical Exam  PRIMARY SURVEY  Airway Airway intact  Breathing Bilateral breath sounds  Circulation Carotid/femoral pulses 2+ intact bilaterally  GCS E =  4 V =  5 M =  6 Total = 15  Environment All clothes removed      SECONDARY SURVEY  Gen: -NAD  HEENT: -Head: NCAT. Scalp is clear of lacerations or wounds. Skull is clear of deformities or depressions -Forehead: Normal -Midface: Stable -Eyes: No visible injury to eyelids or eye, PERRL, EOMI -Nose: No gross deformities, no septal hematoma -Mouth: 0.5 cm to upper lip, mostly inside mouth, confined to mucous membrane, hemostatic, not contaminated. No injuries to tongue or teeth. No trismus or  malposition -Ears: No auricular hematoma -Neck: Trachea is midline, no distended neck veins  Chest: -No tenderness, deformities, bruising or crepitus to clavicles or chest -Normal chest expansion -Normal heart sounds, S1/S2 normal, no m/r/g -No wheezes, rales, rhonchi  Abdomen: -No tenderness, bruising or penetrating injury  Pelvis: -Pelvis is stable and non-tender  Extremities: Right Upper Extremity: -No point tenderness, deformity or other signs of injury -Radial pulse intact RUE, cap refill good -Normal sensation -Normal ROM, good strength Left Upper Extremity: -2 cm skin tear to dorsal ulnar L wrist -Mild point tenderness, deformity or other signs of injury -Radial pulse intact LUE, cap refill good -Normal sensation -Mildly decreased ROM  and strength d/t pain Right Lower Extremity: -Small abrasion to anterior knee, hemostatic -Mild pain to the right great toe without any deformity and mild ecchymosis to the toe -No deformity or other signs of injury -DP intact RLE -Normal sensation -Normal ROM, good strength Left Lower Extremity: -Ecchymosis and swelling to anterior L knee. Small abrasion to anterior knee, hemostatic. -Decreased range of motion due to pain but intact flexion and extension.  Negative anterior and posterior drawer. -Intact DP/PT pulses distally without any pain to the ankle or foot  Back/Spine: -No midline C, T, or L spine tenderness or step-offs  Other: N/A     ED Results / Procedures / Treatments   Labs (all labs ordered are listed, but only abnormal results are displayed) Labs Reviewed  CBC WITH DIFFERENTIAL/PLATELET - Abnormal; Notable for the following components:      Result Value   Platelets 146 (*)    All other components within normal limits  COMPREHENSIVE METABOLIC PANEL - Abnormal; Notable for the following components:   Glucose, Bld 138 (*)    All other components within normal limits    EKG None  Radiology DG Toe Great  Right  Result Date: 03/17/2023 CLINICAL DATA:  fall on wooden porch with right toe bruising EXAM: RIGHT GREAT TOE COMPARISON:  Foot radiograph dated 12/04/2018 FINDINGS: There is no evidence of fracture or dislocation. Hallux valgus. Soft tissues are unremarkable. IMPRESSION: 1.  No radiographic finding of acute displaced fracture. 2. Hallux valgus. Electronically Signed   By: Agustin Cree M.D.   On: 03/17/2023 19:12   DG Chest Portable 1 View  Result Date: 03/17/2023 CLINICAL DATA:  fall, left upper chest TTP EXAM: PORTABLE CHEST 1 VIEW COMPARISON:  Chest x-ray 05/17/2018 FINDINGS: The heart and mediastinal contours are unchanged. Aortic calcification. No focal consolidation. No pulmonary edema. No pleural effusion. No pneumothorax. No acute osseous abnormality. Total reversed right shoulder arthroplasty. IMPRESSION: 1. No active disease. 2.  Aortic Atherosclerosis (ICD10-I70.0). Electronically Signed   By: Tish Frederickson M.D.   On: 03/17/2023 19:11   DG Wrist Complete Left  Result Date: 03/17/2023 CLINICAL DATA:  Fall with pain and bruising.  Left wrist pain. EXAM: LEFT WRIST - COMPLETE 3+ VIEW COMPARISON:  None Available. FINDINGS: Suspected nondisplaced distal radial metaphyseal fracture. Suspected nondisplaced ulna styloid fracture. The bones are under mineralized which limits detailed assessment. Widening of the scapholunate interval appears chronic. There is radiocarpal joint space narrowing and subchondral sclerosis of the radial styloid. Chondrocalcinosis of the triangular fibrocartilage. Moderate distal radioulnar joint degenerative change. Mild soft tissue edema about the wrist. IMPRESSION: 1. Suspected nondisplaced distal radial metaphyseal fracture. Suspected nondisplaced ulna styloid fracture. 2. Chronic widening of the scapholunate interval. 3. Radiocarpal and distal radioulnar joint degenerative change. Chondrocalcinosis. Electronically Signed   By: Narda Rutherford M.D.   On: 03/17/2023  18:10   DG Knee Complete 4 Views Left  Result Date: 03/17/2023 CLINICAL DATA:  Fall, pain, bruising.  Knee pain. EXAM: LEFT KNEE - COMPLETE 4+ VIEW COMPARISON:  None Available. FINDINGS: Left knee arthroplasty in expected alignment. No acute or periprosthetic fracture. Prior patellar resurfacing. The bones are subjectively under mineralized. Minimal knee joint effusion. There is prepatellar soft tissue edema and thickening. IMPRESSION: 1. Prepatellar soft tissue edema and thickening. No acute fracture. 2. Left knee arthroplasty without complication. Electronically Signed   By: Narda Rutherford M.D.   On: 03/17/2023 18:00   CT Head Wo Contrast  Result Date: 03/17/2023 CLINICAL DATA:  Facial trauma, blunt  fall, eliquis; Facial trauma, blunt EXAM: CT HEAD WITHOUT CONTRAST CT MAXILLOFACIAL WITHOUT CONTRAST CT CERVICAL SPINE WITHOUT CONTRAST TECHNIQUE: Multidetector CT imaging of the head, cervical spine, and maxillofacial structures were performed using the standard protocol without intravenous contrast. Multiplanar CT image reconstructions of the cervical spine and maxillofacial structures were also generated. RADIATION DOSE REDUCTION: This exam was performed according to the departmental dose-optimization program which includes automated exposure control, adjustment of the mA and/or kV according to patient size and/or use of iterative reconstruction technique. COMPARISON:  CT Head 05/17/18 FINDINGS: CT HEAD FINDINGS Brain: No evidence of acute infarction, hemorrhage, hydrocephalus, extra-axial collection or mass lesion/mass effect. Vascular: No hyperdense vessel or unexpected calcification. Skull: Normal. Negative for fracture or focal lesion. Other: None. CT MAXILLOFACIAL FINDINGS Osseous: No fracture or mandibular dislocation. No destructive process. Orbits: Bilateral lens replacement. Orbits are otherwise unremarkable. Sinuses: No middle ear or mastoid effusion. Paranasal sinuses are clear. Soft tissues:  Negative. CT CERVICAL SPINE FINDINGS Alignment: Straightening of the normal cervical lordosis. Trace anterolisthesis of C2 on C3. Skull base and vertebrae: No acute fracture. No primary bone lesion or focal pathologic process. Soft tissues and spinal canal: No prevertebral fluid or swelling. No visible canal hematoma. Disc levels:  No evidence of high-grade spinal canal stenosis. Upper chest: Negative. Other: None IMPRESSION: 1. No acute intracranial abnormality. 2. No acute facial bone fracture. 3. No acute fracture or traumatic subluxation of the cervical spine. Electronically Signed   By: Lorenza Cambridge M.D.   On: 03/17/2023 17:53   CT Cervical Spine Wo Contrast  Result Date: 03/17/2023 CLINICAL DATA:  Facial trauma, blunt fall, eliquis; Facial trauma, blunt EXAM: CT HEAD WITHOUT CONTRAST CT MAXILLOFACIAL WITHOUT CONTRAST CT CERVICAL SPINE WITHOUT CONTRAST TECHNIQUE: Multidetector CT imaging of the head, cervical spine, and maxillofacial structures were performed using the standard protocol without intravenous contrast. Multiplanar CT image reconstructions of the cervical spine and maxillofacial structures were also generated. RADIATION DOSE REDUCTION: This exam was performed according to the departmental dose-optimization program which includes automated exposure control, adjustment of the mA and/or kV according to patient size and/or use of iterative reconstruction technique. COMPARISON:  CT Head 05/17/18 FINDINGS: CT HEAD FINDINGS Brain: No evidence of acute infarction, hemorrhage, hydrocephalus, extra-axial collection or mass lesion/mass effect. Vascular: No hyperdense vessel or unexpected calcification. Skull: Normal. Negative for fracture or focal lesion. Other: None. CT MAXILLOFACIAL FINDINGS Osseous: No fracture or mandibular dislocation. No destructive process. Orbits: Bilateral lens replacement. Orbits are otherwise unremarkable. Sinuses: No middle ear or mastoid effusion. Paranasal sinuses are  clear. Soft tissues: Negative. CT CERVICAL SPINE FINDINGS Alignment: Straightening of the normal cervical lordosis. Trace anterolisthesis of C2 on C3. Skull base and vertebrae: No acute fracture. No primary bone lesion or focal pathologic process. Soft tissues and spinal canal: No prevertebral fluid or swelling. No visible canal hematoma. Disc levels:  No evidence of high-grade spinal canal stenosis. Upper chest: Negative. Other: None IMPRESSION: 1. No acute intracranial abnormality. 2. No acute facial bone fracture. 3. No acute fracture or traumatic subluxation of the cervical spine. Electronically Signed   By: Lorenza Cambridge M.D.   On: 03/17/2023 17:53   CT Maxillofacial Wo Contrast  Result Date: 03/17/2023 CLINICAL DATA:  Facial trauma, blunt fall, eliquis; Facial trauma, blunt EXAM: CT HEAD WITHOUT CONTRAST CT MAXILLOFACIAL WITHOUT CONTRAST CT CERVICAL SPINE WITHOUT CONTRAST TECHNIQUE: Multidetector CT imaging of the head, cervical spine, and maxillofacial structures were performed using the standard protocol without intravenous contrast. Multiplanar CT image reconstructions of  the cervical spine and maxillofacial structures were also generated. RADIATION DOSE REDUCTION: This exam was performed according to the departmental dose-optimization program which includes automated exposure control, adjustment of the mA and/or kV according to patient size and/or use of iterative reconstruction technique. COMPARISON:  CT Head 05/17/18 FINDINGS: CT HEAD FINDINGS Brain: No evidence of acute infarction, hemorrhage, hydrocephalus, extra-axial collection or mass lesion/mass effect. Vascular: No hyperdense vessel or unexpected calcification. Skull: Normal. Negative for fracture or focal lesion. Other: None. CT MAXILLOFACIAL FINDINGS Osseous: No fracture or mandibular dislocation. No destructive process. Orbits: Bilateral lens replacement. Orbits are otherwise unremarkable. Sinuses: No middle ear or mastoid effusion.  Paranasal sinuses are clear. Soft tissues: Negative. CT CERVICAL SPINE FINDINGS Alignment: Straightening of the normal cervical lordosis. Trace anterolisthesis of C2 on C3. Skull base and vertebrae: No acute fracture. No primary bone lesion or focal pathologic process. Soft tissues and spinal canal: No prevertebral fluid or swelling. No visible canal hematoma. Disc levels:  No evidence of high-grade spinal canal stenosis. Upper chest: Negative. Other: None IMPRESSION: 1. No acute intracranial abnormality. 2. No acute facial bone fracture. 3. No acute fracture or traumatic subluxation of the cervical spine. Electronically Signed   By: Lorenza Cambridge M.D.   On: 03/17/2023 17:53    Procedures Procedures    Medications Ordered in ED Medications  oxyCODONE (Oxy IR/ROXICODONE) immediate release tablet 5 mg (5 mg Oral Given 03/17/23 1850)    ED Course/ Medical Decision Making/ A&P                          Medical Decision Making Amount and/or Complexity of Data Reviewed Labs: ordered. Radiology: ordered. Decision-making details documented in ED Course.  Risk Prescription drug management.    This patient presents to the ED for concern of fall, L wrist pain, facial trauma, L knee pain; this involves an extensive number of treatment options, and is a complaint that carries with it a high risk of complications and morbidity.  I considered the following differential and admission for this acute, potentially life threatening condition.   MDM:    DDX for trauma includes but is not limited to:  -Head Injury such as skull fx or ICH -fall on thinners with head trauma/facial trauma, negative CT head and max face.  She does have a very small lack confined to the mucous membranes of her upper lip.  I discussed with the patient and performed shared decision-making and we agreed to let it heal on its own without any sutures as mucous membranes small lacerations tend to heal very quickly and very efficiently on  their own -Chest Injury and Abdominal Injury -patient without any significant chest wall tenderness palpation.  Given fall and patient is distracted by the pain in her left knee, obtain chest x-ray which was negative for any rib fractures or intrathoracic injury. -Spinal Cord or Vertebral injury -patient with no neurodeficits and no neck pain.  CT C-spine ordered from triage is negative. -Fractures -left wrist x-ray does demonstrate nondisplaced distal radius and nondisplaced ulnar styloid fractures.  Will place patient in a short arm volar splint and follow-up with orthopedic surgery.  She is neurovascularly intact in that left upper extremity pre and post splint placement.  Patient with severe pain to L knee w/ anterior superficial hematoma.  Compression ace wrap is placed around the knee to help with the hematoma decrease in size.  On reevaluation after several hours in the ED it has  decreased and has not grown.  Patient does not have knee effusion or evidence of hemarthrosis, it is a superficial hematoma.  The x-ray is negative however patient is having difficulty bearing weight on that knee after her Tylenol and oxycodone p.o.  Will obtain a CT to rule out tibial plateau fracture and give additional pain control IV. -Patient was otherwise in her normal state of health and has no other complaints.  Do not believe she requires lab workup here today.   Clinical Course as of 03/20/23 0710  Tue Mar 17, 2023  1814 CT Head Wo Contrast 1. No acute intracranial abnormality. 2. No acute facial bone fracture. 3. No acute fracture or traumatic subluxation of the cervical spine.   [HN]  1814 DG Knee Complete 4 Views Left 1. Prepatellar soft tissue edema and thickening. No acute fracture. 2. Left knee arthroplasty without complication.   [HN]  1814 DG Wrist Complete Left 1. Suspected nondisplaced distal radial metaphyseal fracture. Suspected nondisplaced ulna styloid fracture. 2. Chronic widening of  the scapholunate interval. 3. Radiocarpal and distal radioulnar joint degenerative change. Chondrocalcinosis.   [HN]  1941 DG Toe Great Right 1.  No radiographic finding of acute displaced fracture. 2. Hallux valgus.   [HN]  1941 DG Chest Portable 1 View 1. No active disease. 2.  Aortic Atherosclerosis (ICD10-I70.0).   [HN]  2016 Pt w/ severe knee knee pain with attempted ambulation. Will obtain CT to r/o tibial plateau fracture. [HN]  2109 CT Knee Left Wo Contrast 1. Subcutaneous soft tissue hematoma demonstrated anterior and medial to the patella. 2. Postoperative left total knee arthroplasty. No periprosthetic fracture is identified.   [HN]  2109 No tibial plateau fracture [HN]  2201 Patient's pain much better controlled after morphine IV.  She has eaten as well and feels better.  Took her home carbidopa levodopa.  Patient will be able to be ambulatory with a walker and states she also has a wheelchair that she sometimes uses at home.  She lives with her daughter who can provide help.  They also have a bedside commode that she can use tonight.  She has a short arm volar splint on the left with neurovascular exam intact post splint application.  She has a compression wrap on the left knee.  Reevaluation of the hematoma demonstrates that it has gone down in size with compression.  Patient is instructed to take Tylenol at home 1 g every 8 hours and will be given a prescription for oxycodone as well.  Instructed to rest, ice, and elevate the knee and wrist to help with swelling and pain.  They have an orthopedic doctor they will call in the morning to make an appointment within the next 1 to 2 weeks.  Patient and her daughter given discharge instructions and return precautions.  All questions answered to patient satisfaction. [HN]    Clinical Course User Index [HN] Loetta Rough, MD    Labs: I Ordered, and personally interpreted labs.  The pertinent results include:  those listed  above  Imaging Studies ordered: CTH, CT C-spine, CT max face ordered from triage. XRs including CXR and extremity XR sordered I independently visualized and interpreted imaging. I agree with the radiologist interpretation  Additional history obtained from chart review, daughter at bedside.   Reevaluation: After the interventions noted above, I reevaluated the patient and found that they have :improved  Social Determinants of Health: Lives with family  Disposition:  DC  Co morbidities that complicate the patient  evaluation  Past Medical History:  Diagnosis Date   Abnormal mammogram of left breast    Likely benign microcalcifications--repeat L diag mammo 03/24/2017.  COMPLEX SCLEROSING LESION WITH CALCIFICATIONS --no sign of malignancy.  Excision recommended--pt has been referred to Dr. Dwain Sarna, who recommended f/u mammo and this was normal 12/2017--consider rpt 1 yr per rad.   Arthritis    DJD, back and both hips   Chronic low back pain without sciatica    Lumbar spondylosis + scoliosis.  Summer 2017 Dr. Ethelene Hal did ESI and pt got no relief.  Pt then saw Dr. Retia Passe [facility]: felt she had facet mediated pain; plan for B L345 MBB (??).   COVID 08/2022   Cystocele, midline    Diverticulosis    on colonoscopy 2004   GERD (gastroesophageal reflux disease)    Hx of cardiovascular stress test    Lexiscan Myoview (2/14):  Low risk, small ant defect likely breast attenuation, no ischemia, EF 69% (no change from 2010).     Hyperlipidemia    Hypertension    per pt   Idiopathic scoliosis of thoracolumbar region    Lumbar back pain    Mitral regurgitation    Echo (2/14):  EF 60-65%, Gr 1 DD, mild AI, mild bileaflet MVP, mod post directed MR, mild LAE, PASP 35.   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  Responding to gabapentin (Dr. Vickey Huger).   Osteoporosis, senile    stable left hip 2015  (took fosamax x 29yrs, T-score -2.7 when she stopped bisphosphonate).  Rpt DEXA 70mo later (09/2016) T-score -.3.1 (different machine, though). 11/2019 DEXA T-score -4. Recommended restart of fosamax   PAF (paroxysmal atrial fibrillation) (HCC)    Dr. [name]: Flecainide, metopr, apxiaban.  08/2017-->persistent a-fib w/RVR, flecainide increased and she converted back to NSR and felt better.  100mg  bid flecainide continued.   Parkinson's disease 2020 dx   started sinemet 05/2019 (Dr. Arbutus Leas)   Peripheral neuropathy 2008   Idiopathic vs familial (motor>sensory) vs lumbar disc dz: gabapentin helpful   Shoulder pain left   RC surg 09/2014     Medicines Meds ordered this encounter  Medications   oxyCODONE (Oxy IR/ROXICODONE) immediate release tablet 5 mg    I have reviewed the patients home medicines and have made adjustments as needed  Problem List / ED Course: Problem List Items Addressed This Visit   None               This note was created using dictation software, which may contain spelling or grammatical errors.    Loetta Rough, MD 03/20/23 0800

## 2023-03-17 NOTE — Discharge Instructions (Addendum)
Thank you for coming to [facility] Emergency Department. You were seen for fall at home. We did an exam, labs, and imaging, and these showed a left wrist fracture and a contusion and superficial hematoma of the left knee.  A splint was placed on the left wrist.  Please keep this in place until you see the orthopedic doctor.  Please take 1000 mg of Tylenol every 8 hours.  You can take oxycodone 5 mg every 4-6 hours as needed for severe pain.  Please rest, ice, and elevate the left knee and left wrist.  You can keep the compression wrap around the left knee to help the hematoma go down. Please follow up with your orthopedic surgeon within 1 week.  You can call them in the morning to make an appointment.  Do not hesitate to return to the ED or call 911 if you experience: -Worsening symptoms -Numbness/tingling -Lightheadedness, passing out -Fevers/chills -Anything else that concerns you

## 2023-03-17 NOTE — ED Notes (Signed)
Patient has some discoloration on her right foot great toe. Also discoloration to left hand pinky joint.

## 2023-03-17 NOTE — Progress Notes (Signed)
Patient not ready yet   nurse to give IV and pain meds    nurse will let me know when ready  Level 2 patient ok to come to [place] per charge nurse Nira Conn

## 2023-03-17 NOTE — ED Notes (Signed)
No battle signs noted, no CSF or blood noted in the ears. Skull is intact with no TIC noted. Noted no racoon eyes and all teeth are intact with stable structure.

## 2023-03-17 NOTE — ED Triage Notes (Addendum)
BIB family from home s/p fall, takes eliquis, did not hit head, however did hit face, upper lip, L wrist, L knee. C/o same. No active bleeding. Lac noted to upper lip and skin tear to L wrist. C/c is L knee. Denies LOC, NV. Relates fall to her parkinson's and tripping. Uses rolling walker and was coming in from outside, fell on wooden porch outside. Alert, NAD, calm, interactive. Fell at 1400, to 2 ES tylenol at 1430. H/o knee replacement.

## 2023-03-17 NOTE — ED Notes (Signed)
Pt wheeled to restroom, pt daughter declined restroom assistance

## 2023-03-17 NOTE — ED Notes (Signed)
Patient has a skin tear on the left lateral wrist. Noted a vein exposed. Al hemorrhage controlled at this time. Bandaged with sterile gauze.

## 2023-03-18 ENCOUNTER — Telehealth: Payer: Self-pay | Admitting: Neurology

## 2023-03-18 NOTE — Telephone Encounter (Signed)
Patients daughter called to get a sooner appt than September 19th. Her mother fell and ended up in the ER yesterday and they want her to f/u with tat soon.

## 2023-03-20 NOTE — Telephone Encounter (Signed)
Patients daughter said patient is in a lot of pain right now with her wrist and under arm. They are seeing ortho dr next week. She would like to hold off on home PT right now

## 2023-03-23 DIAGNOSIS — M25532 Pain in left wrist: Secondary | ICD-10-CM | POA: Diagnosis not present

## 2023-03-28 DIAGNOSIS — R0781 Pleurodynia: Secondary | ICD-10-CM | POA: Diagnosis not present

## 2023-03-30 ENCOUNTER — Other Ambulatory Visit: Payer: Self-pay | Admitting: Neurology

## 2023-03-30 ENCOUNTER — Other Ambulatory Visit: Payer: Self-pay | Admitting: Family Medicine

## 2023-03-30 DIAGNOSIS — G2581 Restless legs syndrome: Secondary | ICD-10-CM

## 2023-03-30 DIAGNOSIS — G20A1 Parkinson's disease without dyskinesia, without mention of fluctuations: Secondary | ICD-10-CM

## 2023-03-30 DIAGNOSIS — G609 Hereditary and idiopathic neuropathy, unspecified: Secondary | ICD-10-CM

## 2023-04-21 NOTE — Progress Notes (Unsigned)
Assessment/Plan:   1.  Parkinsons Disease  -increase carbidopa/levodopa to 2 at 8am, 11am, 2pm and 1 at 5pm.  This is due to wearing off per patient report.  She actually looked really good today and was seen at a time where she, in theory, should have been wearing off.  -continue carbidopa/levodopa 50/200 CR at bed to see if helps rls  -We discussed that it used to be thought that levodopa would increase risk of melanoma but now it is believed that Parkinsons itself likely increases risk of melanoma. she is to get regular skin checks.  She is doing this.  -Discussed walker types and told her that I really do not like the rollator.  -talked to them about driving.  Discussed lowered reflex/reaction time with aging and Parkinsons Disease and not sure that she is going to be able to continued driving.  They state that they are prepared for this and understand this.  2.  Insomnia  -Increase trazodone, 100 mg q hs.  Risk, benefits, side effects discussed.  If not helpful, needs to f/u with pcp to talk about bladder/nocturia as that is what is waking her up, even though she doesn't go alot  3.  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 Subjective:   Cassie White was seen today in follow up for Parkinsons disease.  My previous records were reviewed prior to todays visit as well as outside records available to me.  The patient with daughter who supplements the hx. family contacted me not long after our last visit about the patient's toes curling, but it turns out it was a fixed issue and not something that comes and goes.  They called back the next month regarding a different issue, stating that she was taking some trazodone as needed and ran out early.  I told them that I did not recommend that trazodone be taken on an as-needed basis, and my recommendations ultimately were that her sleep issues were from nocturia, and I recommended an  appointment with urology.  She was in the emergency room August 13 after a fall.  She actually fell when going across the front porch.  She had her walker but "it just got away from me."  Her daughter thinks that one of the planks of the front porch was sticking up and she tripped over it.  She sustained a nondisplaced distal radius and ulnar styloid fractures on the left per UC but when they saw ortho, they said it was not a fracture.  Her CT brain was not acute.  PT was declined when they called our office.  No other falls are noted.  She reports that she is having trouble sleeping and doesn't think its related to nocturia, although it is the nocturia that is actually waking her up and then she cannot get back to sleep  Current prescribed movement disorder medications: Carbidopa/levodopa 25/100, 2 tablets 3 times per day (8am/noon/4pm) - feels that it wears off at least hour prior to next dose carbidopa/levodopa 50/200 at bed Trazodone, 50 mg at bedtime  PREVIOUS MEDICATIONS: Sinemet  ALLERGIES:   Allergies  Allergen Reactions   Sulfa Antibiotics Swelling    face   Tizanidine Other (See Comments)    delirium    CURRENT MEDICATIONS:  Outpatient Encounter Medications as of 04/23/2023  Medication Sig   apixaban (ELIQUIS) 5 MG TABS tablet Take 1 tablet (5 mg total) by mouth 2 (  two) times daily.   betamethasone dipropionate 0.05 % lotion    Biotin 5000 MCG CAPS Take 5,000 mcg by mouth daily.   Calcium Carbonate (CALCIUM 600 PO) Take 600 mg by mouth 2 (two) times daily.    cyanocobalamin (VITAMIN B12) 1000 MCG/ML injection INJECT 1 ML (1,000 MCG) INTRAMUSCULARLY EVERY 30 DAYS   flecainide (TAMBOCOR) 100 MG tablet Take 1 tablet (100 mg total) by mouth 2 (two) times daily. Please keep upcoming appointment with Dr [name] for any further refills.   gabapentin (NEURONTIN) 300 MG capsule TAKE 2 CAPSULES BY MOUTH TWICE A DAY   metoprolol tartrate (LOPRESSOR) 25 MG tablet Take 1 tablet (25 mg total)  by mouth 2 (two) times daily.   nitroGLYCERIN (NITROSTAT) 0.4 MG SL tablet PLACE 1 TABLET UNDER THE TONGUE EVERY 5 MINUTES AS NEEDED FOR CHEST PAIN.   Omega-3 Fatty Acids (FISH OIL) 1200 MG CAPS Take 1,000 mg by mouth 2 (two) times daily.    pravastatin (PRAVACHOL) 40 MG tablet Take 1 tablet (40 mg total) by mouth daily.   raloxifene (EVISTA) 60 MG tablet TAKE 1 TABLET BY MOUTH EVERY DAY   traZODone (DESYREL) 100 MG tablet Take 1 tablet (100 mg total) by mouth at bedtime.   [DISCONTINUED] carbidopa-levodopa (SINEMET CR) 50-200 MG tablet Take 1 tablet by mouth at bedtime.   [DISCONTINUED] carbidopa-levodopa (SINEMET IR) 25-100 MG tablet Take 2 tablets by mouth 3 (three) times daily. 8am/noon/4pm   [DISCONTINUED] traZODone (DESYREL) 50 MG tablet TAKE 1 TABLET BY MOUTH EVERYDAY AT BEDTIME   carbidopa-levodopa (SINEMET CR) 50-200 MG tablet Take 1 tablet by mouth at bedtime.   carbidopa-levodopa (SINEMET IR) 25-100 MG tablet 2 at 8am, 2 at 11am, 2 at 2pm, 1 at 5pm   fluticasone (FLONASE) 50 MCG/ACT nasal spray Place 2 sprays into both nostrils daily. (Patient not taking: Reported on 03/09/2023)   No facility-administered encounter medications on file as of 04/23/2023.    Objective:   PHYSICAL EXAMINATION:    VITALS:   Vitals:   04/23/23 1018  BP: 124/62  Pulse: 61  SpO2: 95%  Weight: 143 lb 6.4 oz (65 kg)  Height: 5\' 2"  (1.575 m)     GEN:  The patient appears stated age and is in NAD. HEENT:  Normocephalic, atraumatic.  The mucous membranes are moist. The superficial temporal arteries are without ropiness or tenderness. CV:  rrr Lungs:  CTAB Neck/HEME:  There are no carotid bruits bilaterally.  Neurological examination:  Orientation: The patient is alert and oriented x3.  Looks to daughter for finer aspects of hx Cranial nerves: There is good facial symmetry with min facial hypomimia. The speech is fluent and clear. Soft palate rises symmetrically and there is no tongue deviation.  Hearing is intact to conversational tone. Sensation: Sensation is intact to light touch throughout Motor: Strength is at least antigravity x4.  Movement examination: Tone: There is nl tone in the UE/LE Abnormal movements: rare tremor of the RUE, rest Coordination:  There is no decremation with RAM's, with any form of RAMS, including alternating supination and pronation of the forearm, hand opening and closing, finger taps, heel taps and toe taps. Gait and Station: The patient pushes off to arise.  The patient is antalgic.  She is forward flexed.  She drags the legs with ambulation, R>L.  She festinates a bit with the walker and has significant scoliosis.    I have reviewed and interpreted the following labs independently    Chemistry  Component Value Date/Time   NA 139 03/17/2023 1715   NA 145 (H) 06/02/2018 1514   K 3.9 03/17/2023 1715   CL 104 03/17/2023 1715   CO2 28 03/17/2023 1715   BUN 20 03/17/2023 1715   BUN 18 06/02/2018 1514   CREATININE 0.81 03/17/2023 1715      Component Value Date/Time   CALCIUM 9.3 03/17/2023 1715   ALKPHOS 64 03/17/2023 1715   AST 18 03/17/2023 1715   ALT 6 03/17/2023 1715   BILITOT 0.4 03/17/2023 1715       Lab Results  Component Value Date   WBC 6.1 03/17/2023   HGB 13.4 03/17/2023   HCT 40.7 03/17/2023   MCV 94.0 03/17/2023   PLT 146 (L) 03/17/2023    Lab Results  Component Value Date   TSH 0.80 06/02/2022     Total time spent on today's visit was 30 minutes, including both face-to-face time and nonface-to-face time.  Time included that spent on review of records (prior notes available to me/labs/imaging if pertinent), discussing treatment and goals, answering patient's questions and coordinating care.  Cc:  Jeoffrey Massed, MD

## 2023-04-23 ENCOUNTER — Ambulatory Visit: Payer: Medicare Other | Admitting: Neurology

## 2023-04-23 ENCOUNTER — Encounter: Payer: Self-pay | Admitting: Neurology

## 2023-04-23 VITALS — BP 124/62 | HR 61 | Ht 62.0 in | Wt 143.4 lb

## 2023-04-23 DIAGNOSIS — G20A2 Parkinson's disease without dyskinesia, with fluctuations: Secondary | ICD-10-CM

## 2023-04-23 DIAGNOSIS — G20A1 Parkinson's disease without dyskinesia, without mention of fluctuations: Secondary | ICD-10-CM

## 2023-04-23 DIAGNOSIS — G4700 Insomnia, unspecified: Secondary | ICD-10-CM

## 2023-04-23 DIAGNOSIS — R351 Nocturia: Secondary | ICD-10-CM | POA: Diagnosis not present

## 2023-04-23 MED ORDER — TRAZODONE HCL 100 MG PO TABS: 100.0000 mg | ORAL_TABLET | Freq: Every day | ORAL | 1 refills | Status: DC

## 2023-04-23 MED ORDER — CARBIDOPA-LEVODOPA 25-100 MG PO TABS
ORAL_TABLET | ORAL | 1 refills | Status: DC
Start: 2023-04-23 — End: 2023-04-27

## 2023-04-23 MED ORDER — CARBIDOPA-LEVODOPA ER 50-200 MG PO TBCR
1.0000 | EXTENDED_RELEASE_TABLET | Freq: Every day | ORAL | 1 refills | Status: DC
Start: 2023-04-23 — End: 2023-11-23

## 2023-04-23 NOTE — Patient Instructions (Addendum)
increase carbidopa/levodopa to 2 tablets at 8am, 11am, 2pm and 1 at 5pm Continue carbidopa/levodopa 50/200 at bedtime Stop trazodone 50 mg at bed and start trazodone 100 mg at bed  SAVE THE DATE!  We are planning a Parkinsons Disease educational symposium at [facility] in [place] on October 11.  More details to come!  If you would like to be added to our email list to get further information, email [email or web address].  To sign up, you can email [email or web address].  We hope to see you there!

## 2023-04-26 ENCOUNTER — Other Ambulatory Visit: Payer: Self-pay | Admitting: Neurology

## 2023-04-26 DIAGNOSIS — G20A1 Parkinson's disease without dyskinesia, without mention of fluctuations: Secondary | ICD-10-CM

## 2023-04-27 ENCOUNTER — Other Ambulatory Visit: Payer: Self-pay

## 2023-05-18 ENCOUNTER — Ambulatory Visit: Payer: Medicare Other | Admitting: Podiatry

## 2023-05-18 DIAGNOSIS — B351 Tinea unguium: Secondary | ICD-10-CM | POA: Diagnosis not present

## 2023-05-18 DIAGNOSIS — M79675 Pain in left toe(s): Secondary | ICD-10-CM | POA: Diagnosis not present

## 2023-05-18 DIAGNOSIS — Z7901 Long term (current) use of anticoagulants: Secondary | ICD-10-CM

## 2023-05-18 DIAGNOSIS — M79674 Pain in right toe(s): Secondary | ICD-10-CM

## 2023-05-20 NOTE — Progress Notes (Signed)
Subjective: 82 year old female presents for concerns of nails becoming thick elongated is not able to trim herself.  She did well since the flexor tendon of the fourth toe with no residual callus or pain in this toe.  Experiencing pain to the third toe on the right foot warts curving into the skin at the tip.  No drainage or pus.  On Eliquis    Objective: AAO x3, NAD-daughter present. DP/PT pulses palpable bilaterally, CRT less than 3 seconds Nails are hypertrophic, dystrophic, brittle, discolored, elongated 10. No surrounding redness or drainage. Tenderness nails 1-5 bilaterally. No open lesions or pre-ulcerative lesions are identified today. Hammertoes are present. No pain with calf compression, swelling, warmth, erythema  Assessment: 82 year old female status post flexor tenotomy with hammertoe deformities  Plan: -All treatment options discussed with the patient including all alternatives, risks, complications.  -Sharply debrided the nails x 10 without any complications or bleeding.  Particular right third toe ulcer to get ingrown is able to remove this corner without any complications or bleeding.  Monitoring signs or symptoms of infection. -She wants to hold off on any further surgical intervention for the digital deformities at this time.  Return in about 3 months (around 08/18/2023).  Vivi Barrack DPM

## 2023-06-23 ENCOUNTER — Telehealth: Payer: Self-pay | Admitting: Neurology

## 2023-06-23 NOTE — Telephone Encounter (Signed)
Called and spoke to patients daughter that increasing isnt always for the best and she understood

## 2023-06-23 NOTE — Telephone Encounter (Signed)
Patient daughter called and needs to see if we can increase the Carbidopa levodopa. Her tremors are increasing.   They use the [facility] in [place]

## 2023-06-29 ENCOUNTER — Other Ambulatory Visit: Payer: Self-pay | Admitting: Family Medicine

## 2023-07-22 ENCOUNTER — Ambulatory Visit: Payer: Medicare Other | Admitting: *Deleted

## 2023-07-22 DIAGNOSIS — Z0000 Encounter for general adult medical examination without abnormal findings: Secondary | ICD-10-CM

## 2023-07-22 NOTE — Patient Instructions (Signed)
[name] , Thank you for taking time to come for your Medicare Wellness Visit. I appreciate your ongoing commitment to your health goals. Please review the following plan we discussed and let me know if I can assist you in the future.   Screening recommendations/referrals: Colonoscopy: no longer required Mammogram: up to date Bone Density: up to date Recommended yearly ophthalmology/optometry visit for glaucoma screening and checkup Recommended yearly dental visit for hygiene and checkup  Vaccinations: Influenza vaccine: up to date Pneumococcal vaccine: up to date Tdap vaccine: Education provided Shingles vaccine: up to date    Advanced directives: Education provided    Preventive Care 65 Years and Older, Female Preventive care refers to lifestyle choices and visits with your health care provider that can promote health and wellness. What does preventive care include? A yearly physical exam. This is also called an annual well check. Dental exams once or twice a year. Routine eye exams. Ask your health care provider how often you should have your eyes checked. Personal lifestyle choices, including: Daily care of your teeth and gums. Regular physical activity. Eating a healthy diet. Avoiding tobacco and drug use. Limiting alcohol use. Practicing safe sex. Taking low-dose aspirin every day. Taking vitamin and mineral supplements as recommended by your health care provider. What happens during an annual well check? The services and screenings done by your health care provider during your annual well check will depend on your age, overall health, lifestyle risk factors, and family history of disease. Counseling  Your health care provider may ask you questions about your: Alcohol use. Tobacco use. Drug use. Emotional well-being. Home and relationship well-being. Sexual activity. Eating habits. History of falls. Memory and ability to understand (cognition). Work and work  Astronomer. Reproductive health. Screening  You may have the following tests or measurements: Height, weight, and BMI. Blood pressure. Lipid and cholesterol levels. These may be checked every 5 years, or more frequently if you are over 31 years old. Skin check. Lung cancer screening. You may have this screening every year starting at age 70 if you have a 30-pack-year history of smoking and currently smoke or have quit within the past 15 years. Fecal occult blood test (FOBT) of the stool. You may have this test every year starting at age 66. Flexible sigmoidoscopy or colonoscopy. You may have a sigmoidoscopy every 5 years or a colonoscopy every 10 years starting at age 33. Hepatitis C blood test. Hepatitis B blood test. Sexually transmitted disease (STD) testing. Diabetes screening. This is done by checking your blood sugar (glucose) after you have not eaten for a while (fasting). You may have this done every 1-3 years. Bone density scan. This is done to screen for osteoporosis. You may have this done starting at age 86. Mammogram. This may be done every 1-2 years. Talk to your health care provider about how often you should have regular mammograms. Talk with your health care provider about your test results, treatment options, and if necessary, the need for more tests. Vaccines  Your health care provider may recommend certain vaccines, such as: Influenza vaccine. This is recommended every year. Tetanus, diphtheria, and acellular pertussis (Tdap, Td) vaccine. You may need a Td booster every 10 years. Zoster vaccine. You may need this after age 64. Pneumococcal 13-valent conjugate (PCV13) vaccine. One dose is recommended after age 4. Pneumococcal polysaccharide (PPSV23) vaccine. One dose is recommended after age 13. Talk to your health care provider about which screenings and vaccines you need and how often  you need them. This information is not intended to replace advice given to you by  your health care provider. Make sure you discuss any questions you have with your health care provider. Document Released: 08/17/2015 Document Revised: 04/09/2016 Document Reviewed: 05/22/2015 Elsevier Interactive Patient Education  2017 [employer].  Fall Prevention in the Home Falls can cause injuries. They can happen to people of all ages. There are many things you can do to make your home safe and to help prevent falls. What can I do on the outside of my home? Regularly fix the edges of walkways and driveways and fix any cracks. Remove anything that might make you trip as you walk through a door, such as a raised step or threshold. Trim any bushes or trees on the path to your home. Use bright outdoor lighting. Clear any walking paths of anything that might make someone trip, such as rocks or tools. Regularly check to see if handrails are loose or broken. Make sure that both sides of any steps have handrails. Any raised decks and porches should have guardrails on the edges. Have any leaves, snow, or ice cleared regularly. Use sand or salt on walking paths during winter. Clean up any spills in your garage right away. This includes oil or grease spills. What can I do in the bathroom? Use night lights. Install grab bars by the toilet and in the tub and shower. Do not use towel bars as grab bars. Use non-skid mats or decals in the tub or shower. If you need to sit down in the shower, use a plastic, non-slip stool. Keep the floor dry. Clean up any water that spills on the floor as soon as it happens. Remove soap buildup in the tub or shower regularly. Attach bath mats securely with double-sided non-slip rug tape. Do not have throw rugs and other things on the floor that can make you trip. What can I do in the bedroom? Use night lights. Make sure that you have a light by your bed that is easy to reach. Do not use any sheets or blankets that are too big for your bed. They should not hang  down onto the floor. Have a firm chair that has side arms. You can use this for support while you get dressed. Do not have throw rugs and other things on the floor that can make you trip. What can I do in the kitchen? Clean up any spills right away. Avoid walking on wet floors. Keep items that you use a lot in easy-to-reach places. If you need to reach something above you, use a strong step stool that has a grab bar. Keep electrical cords out of the way. Do not use floor polish or wax that makes floors slippery. If you must use wax, use non-skid floor wax. Do not have throw rugs and other things on the floor that can make you trip. What can I do with my stairs? Do not leave any items on the stairs. Make sure that there are handrails on both sides of the stairs and use them. Fix handrails that are broken or loose. Make sure that handrails are as long as the stairways. Check any carpeting to make sure that it is firmly attached to the stairs. Fix any carpet that is loose or worn. Avoid having throw rugs at the top or bottom of the stairs. If you do have throw rugs, attach them to the floor with carpet tape. Make sure that you have a light  switch at the top of the stairs and the bottom of the stairs. If you do not have them, ask someone to add them for you. What else can I do to help prevent falls? Wear shoes that: Do not have high heels. Have rubber bottoms. Are comfortable and fit you well. Are closed at the toe. Do not wear sandals. If you use a stepladder: Make sure that it is fully opened. Do not climb a closed stepladder. Make sure that both sides of the stepladder are locked into place. Ask someone to hold it for you, if possible. Clearly mark and make sure that you can see: Any grab bars or handrails. First and last steps. Where the edge of each step is. Use tools that help you move around (mobility aids) if they are needed. These  include: Canes. Walkers. Scooters. Crutches. Turn on the lights when you go into a dark area. Replace any light bulbs as soon as they burn out. Set up your furniture so you have a clear path. Avoid moving your furniture around. If any of your floors are uneven, fix them. If there are any pets around you, be aware of where they are. Review your medicines with your doctor. Some medicines can make you feel dizzy. This can increase your chance of falling. Ask your doctor what other things that you can do to help prevent falls. This information is not intended to replace advice given to you by your health care provider. Make sure you discuss any questions you have with your health care provider. Document Released: 05/17/2009 Document Revised: 12/27/2015 Document Reviewed: 08/25/2014 Elsevier Interactive Patient Education  2017 [employer].

## 2023-07-22 NOTE — Progress Notes (Signed)
Subjective:   Cassie White is a 82 y.o. female who presents for Medicare Annual (Subsequent) preventive examination.  Visit Complete: Virtual I connected with  [name] on 07/22/23 by a audio enabled telemedicine application and verified that I am speaking with the correct person using two identifiers.  Patient Location: Home  Provider Location: Home Office  I discussed the limitations of evaluation and management by telemedicine. The patient expressed understanding and agreed to proceed.  Vital Signs: Because this visit was a virtual/telehealth visit, some criteria may be missing or patient reported. Any vitals not documented were not able to be obtained and vitals that have been documented are patient reported.    Cardiac Risk Factors include: advanced age (>56men, >78 women);hypertension     Objective:    There were no vitals filed for this visit. There is no height or weight on file to calculate BMI.     07/22/2023    2:39 PM 04/23/2023   10:18 AM 03/17/2023    5:01 PM 10/23/2022   10:13 AM 07/16/2022    3:27 PM 04/15/2022   11:22 AM 03/14/2022   12:09 PM  Advanced Directives  Does Patient Have a Medical Advance Directive? No Yes No No No Yes No  Type of Advance Directive  Living will       Would patient like information on creating a medical advance directive? No - Patient declined    No - Patient declined      Current Medications (verified) Outpatient Encounter Medications as of 07/22/2023  Medication Sig   apixaban (ELIQUIS) 5 MG TABS tablet Take 1 tablet (5 mg total) by mouth 2 (two) times daily.   betamethasone dipropionate 0.05 % lotion    Biotin 5000 MCG CAPS Take 5,000 mcg by mouth daily.   Calcium Carbonate (CALCIUM 600 PO) Take 600 mg by mouth 2 (two) times daily.    carbidopa-levodopa (SINEMET CR) 50-200 MG tablet Take 1 tablet by mouth at bedtime.   carbidopa-levodopa (SINEMET IR) 25-100 MG tablet Take  2 at 8am, Take 2 at 11 am,  Take 2 at 2 pm  and 1 at 5pm   cyanocobalamin (VITAMIN B12) 1000 MCG/ML injection INJECT 1 ML (1,000 MCG) INTRAMUSCULARLY EVERY 30 DAYS   flecainide (TAMBOCOR) 100 MG tablet Take 1 tablet (100 mg total) by mouth 2 (two) times daily. Please keep upcoming appointment with Dr [name] for any further refills.   gabapentin (NEURONTIN) 300 MG capsule TAKE 2 CAPSULES BY MOUTH TWICE A DAY   metoprolol tartrate (LOPRESSOR) 25 MG tablet Take 1 tablet (25 mg total) by mouth 2 (two) times daily.   nitroGLYCERIN (NITROSTAT) 0.4 MG SL tablet PLACE 1 TABLET UNDER THE TONGUE EVERY 5 MINUTES AS NEEDED FOR CHEST PAIN.   Omega-3 Fatty Acids (FISH OIL) 1200 MG CAPS Take 1,000 mg by mouth 2 (two) times daily.    pravastatin (PRAVACHOL) 40 MG tablet Take 1 tablet (40 mg total) by mouth daily.   raloxifene (EVISTA) 60 MG tablet TAKE 1 TABLET BY MOUTH EVERY DAY   traZODone (DESYREL) 100 MG tablet Take 1 tablet (100 mg total) by mouth at bedtime.   fluticasone (FLONASE) 50 MCG/ACT nasal spray Place 2 sprays into both nostrils daily. (Patient not taking: Reported on 07/22/2023)   No facility-administered encounter medications on file as of 07/22/2023.    Allergies (verified) Sulfa antibiotics and Tizanidine   History: Past Medical History:  Diagnosis Date   Abnormal mammogram of left breast  Likely benign microcalcifications--repeat L diag mammo 03/24/2017.  COMPLEX SCLEROSING LESION WITH CALCIFICATIONS --no sign of malignancy.  Excision recommended--pt has been referred to Dr. Dwain Sarna, who recommended f/u mammo and this was normal 12/2017--consider rpt 1 yr per rad.   Arthritis    DJD, back and both hips   Chronic low back pain without sciatica    Lumbar spondylosis + scoliosis.  Summer 2017 Dr. Ethelene Hal did ESI and pt got no relief.  Pt then saw Dr. Retia Passe [facility]: felt she had facet mediated pain; plan for B L345 MBB (??).   COVID 08/2022   Cystocele, midline    Diverticulosis    on colonoscopy 2004   GERD  (gastroesophageal reflux disease)    Hx of cardiovascular stress test    Lexiscan Myoview (2/14):  Low risk, small ant defect likely breast attenuation, no ischemia, EF 69% (no change from 2010).     Hyperlipidemia    Hypertension    per pt   Idiopathic scoliosis of thoracolumbar region    Lumbar back pain    Mitral regurgitation    Echo (2/14):  EF 60-65%, Gr 1 DD, mild AI, mild bileaflet MVP, mod post directed MR, mild LAE, PASP 35.   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  Responding to gabapentin (Dr. Vickey Huger).   Osteoporosis, senile    stable left hip 2015 (took fosamax x 67yrs, T-score -2.7 when she stopped bisphosphonate).  Rpt DEXA 82mo later (09/2016) T-score -.3.1 (different machine, though). 11/2019 DEXA T-score -4. Recommended restart of fosamax   PAF (paroxysmal atrial fibrillation) (HCC)    Dr. [name]: Flecainide, metopr, apxiaban.  08/2017-->persistent a-fib w/RVR, flecainide increased and she converted back to NSR and felt better.  100mg  bid flecainide continued.   Parkinson's disease (HCC) 2020 dx   started sinemet 05/2019 (Dr. Arbutus Leas)   Peripheral neuropathy 2008   Idiopathic vs familial (motor>sensory) vs lumbar disc dz: gabapentin helpful   Shoulder pain left   RC surg 09/2014   Past Surgical History:  Procedure Laterality Date   BREAST BIOPSY  04/10/2017   Left breast core needle biopsy of calcifications.  COMPLEX SCLEROSING LESION WITH CALCIFICATIONS --no sign of malignancy.  Excision recommended--pt has been referred to Dr. Dwain Sarna, who recommended rpt mammo and this was NORMAL 12/2017.   BREAST EXCISIONAL BIOPSY Right 2010   Benign   BREAST SURGERY  Nov 2010   Benign biopsy, right   CHOLECYSTECTOMY  03/2010   Dr.Gross   COLONOSCOPY  10/03/2002   No polyps.  Repeat 10 yrs recommended but pt declines.  Pt declined cologuard 08/2016 but changed her mind and this test was NEG on  03/23/17.   COMBINED HYSTERECTOMY VAGINAL / OOPHORECTOMY / A&P REPAIR  1990   uncertain if ovaries were removed or not   CYSTOCELE REPAIR     DEXA  11/2019   T score -4   Lumpectomy  06/2009   "Fatty Necrosis"   PFT's  2014   NORMAL   REVERSE SHOULDER ARTHROPLASTY Right 03/11/2019   Procedure: REVERSE TOTAL SHOULDER ARTHROPLASTY;  Surgeon: Beverely Low, MD;  Location: [facility];  Service: Orthopedics;  Laterality: Right;  Intersclene block   ROTATOR CUFF REPAIR Left 10/02/14   Dr. Ranell Patrick   TONSILLECTOMY     TOTAL KNEE ARTHROPLASTY  09/2009   Right ;Dr Charlann Boxer   TOTAL KNEE ARTHROPLASTY Left 03/10/2016   Procedure: TOTAL KNEE ARTHROPLASTY;  Surgeon: Molli Hazard  Charlann Boxer, MD;  Location: [facility];  Service: Orthopedics;  Laterality: Left;   TRANSTHORACIC ECHOCARDIOGRAM  09/2012; 09/27/15   2014: EF 60-65%, Gr 1 DD, mild AI, mild bileaflet MVP, mod post directed MR, mild LAE, PASP 35.  Repeat 2017: EF 55-60%, mild AR, mild MVP and mild MV regurg.   VAGINAL HYSTERECTOMY     For Uterine Deviation    Family History  Problem Relation Age of Onset   Heart attack Father        in 68s   Stroke Father 11   Ovarian cancer Mother    Heart attack Son 46       Smoker   Coronary artery disease Brother    COPD Brother    Healthy Son    Healthy Daughter    Diabetes Neg Hx    Social History   Socioeconomic History   Marital status: Widowed    Spouse name: Not on file   Number of children: 3   Years of education: HS grad   Highest education level: High school graduate  Occupational History   Occupation: Fish farm manager- bus driver- retired  Tobacco Use   Smoking status: Never   Smokeless tobacco: Never  Vaping Use   Vaping status: Never Used  Substance and Sexual Activity   Alcohol use: No    Alcohol/week: 0.0 standard drinks of alcohol   Drug use: No   Sexual activity: Never    Birth control/protection: Post-menopausal  Other Topics Concern   Not on file  Social History Narrative   Widow, has 3  children, 4 grandchildren.   Orig from [place].   Occupation; retired Surveyor, mining.  Also worked in Hydrologist.   Caffiene, 1 cup daily avg.   No tob, no alc, no drugs.   Exercise: walking, limited by bilat hip pain.   Social Drivers of Corporate investment banker Strain: Low Risk  (07/22/2023)   Overall Financial Resource Strain (CARDIA)    Difficulty of Paying Living Expenses: Not hard at all  Food Insecurity: No Food Insecurity (07/22/2023)   Hunger Vital Sign    Worried About Running Out of Food in the Last Year: Never true    Ran Out of Food in the Last Year: Never true  Transportation Needs: No Transportation Needs (07/22/2023)   PRAPARE - Administrator, Civil Service (Medical): No    Lack of Transportation (Non-Medical): No  Physical Activity: Inactive (07/22/2023)   Exercise Vital Sign    Days of Exercise per Week: 0 days    Minutes of Exercise per Session: 0 min  Stress: No Stress Concern Present (07/22/2023)   [employer] of Occupational Health - Occupational Stress Questionnaire    Feeling of Stress : Not at all  Social Connections: Moderately Isolated (07/22/2023)   Social Connection and Isolation Panel [NHANES]    Frequency of Communication with Friends and Family: Twice a week    Frequency of Social Gatherings with Friends and Family: Three times a week    Attends Religious Services: More than 4 times per year    Active Member of Clubs or Organizations: No    Attends Banker Meetings: Never    Marital Status: Widowed    Tobacco Counseling Counseling given: Not Answered   Clinical Intake:  Pre-visit preparation completed: Yes  Pain : No/denies pain     Diabetes: No  How often do you need to have someone help you when you read instructions, pamphlets, or  other written materials from your doctor or pharmacy?: 1 - Never  Interpreter Needed?: No  Information entered by :: Remi Haggard LPN   Activities of  Daily Living    07/22/2023    2:52 PM  In your present state of health, do you have any difficulty performing the following activities:  Hearing? 1  Vision? 0  Difficulty concentrating or making decisions? 0  Walking or climbing stairs? 1  Dressing or bathing? 0  Doing errands, shopping? 1  Preparing Food and eating ? N  Using the Toilet? N  In the past six months, have you accidently leaked urine? Y  Do you have problems with loss of bowel control? N  Managing your Medications? N  Managing your Finances? N  Housekeeping or managing your Housekeeping? N    Patient Care Team: Jeoffrey Massed, MD as PCP - General (Family Medicine) [name], Peter M, MD as PCP - Cardiology (Cardiology) [name], Peter M, MD as Consulting Physician (Cardiology) Dohmeier, Porfirio Mylar, MD as Consulting Physician (Neurology) Sheran Luz, MD as Consulting Physician (Physical Medicine and Rehabilitation) Letta Kocher, MD as Consulting Physician (Rehabilitation) Patricia Nettle, MD (Orthopedic Surgery) Butch Penny, NP as Consulting Physician (Gerontology) Tat, Octaviano Batty, DO as Consulting Physician (Neurology) Rodell Perna, NP (Inactive) as Nurse Practitioner (Obstetrics and Gynecology) Arby Barrette, FNP (Nurse Practitioner)  Indicate any recent Medical Services you may have received from other than [facility] providers in the past year (date may be approximate).     Assessment:   This is a routine wellness examination for Dearia.  Hearing/Vision screen Hearing Screening - Comments:: Bilateral hearing aids Vision Screening - Comments:: Up to date [facility]  [place]   Goals Addressed             This Visit's Progress    Patient Stated       No  goals       Depression Screen    07/22/2023    2:43 PM 03/09/2023    1:54 PM 11/17/2022    3:06 PM 07/16/2022    3:28 PM 07/16/2022    3:25 PM 06/02/2022    2:09 PM 09/06/2021   10:35 AM  PHQ 2/9 Scores  PHQ - 2 Score 0 3 1 0 0 0 0  PHQ-  9 Score 2 9 8         Fall Risk    07/22/2023    2:33 PM 04/23/2023   10:18 AM 03/09/2023    1:54 PM 11/17/2022    2:39 PM 07/16/2022    3:27 PM  Fall Risk   Falls in the past year? 1 1 0 0 1  Number falls in past yr: 0 0  0 1  Injury with Fall? 0 1  0 0  Risk for fall due to : Impaired balance/gait   Impaired balance/gait History of fall(s)  Follow up Falls evaluation completed;Education provided;Falls prevention discussed Falls evaluation completed Falls evaluation completed Falls evaluation completed Falls evaluation completed    MEDICARE RISK AT HOME: Medicare Risk at Home Any stairs in or around the home?: Yes If so, are there any without handrails?: No Home free of loose throw rugs in walkways, pet beds, electrical cords, etc?: Yes Adequate lighting in your home to reduce risk of falls?: Yes Life alert?: No Use of a cane, walker or w/c?: Yes Grab bars in the bathroom?: Yes Shower chair or bench in shower?: Yes Elevated toilet seat or a handicapped toilet?: Yes  TIMED UP  AND GO:  Was the test performed?  No    Cognitive Function:    08/08/2016   10:21 AM  MMSE - Mini Mental State Exam  Orientation to time 5  Orientation to Place 5  Registration 3  Attention/ Calculation 5  Recall 2  Language- name 2 objects 2  Language- repeat 1  Language- follow 3 step command 3  Language- read & follow direction 1  Write a sentence 1  Copy design 1  Total score 29        07/22/2023    2:39 PM 07/16/2022    3:28 PM 04/27/2021    1:41 PM  6CIT Screen  What Year? 0 points 0 points 0 points  What month? 0 points 0 points 0 points  What time? 0 points 0 points 0 points  Count back from 20 0 points 0 points 0 points  Months in reverse 0 points 0 points 0 points  Repeat phrase 2 points 0 points 0 points  Total Score 2 points 0 points 0 points    Immunizations Immunization History  Administered Date(s) Administered   Fluad Quad(high Dose 65+) 04/26/2019, 05/10/2021,  06/02/2022   Influenza Whole 05/04/2012   Influenza, High Dose Seasonal PF 05/12/2013, 04/28/2014, 05/28/2015, 04/28/2016, 05/18/2017, 05/12/2018   Moderna Sars-Covid-2 Vaccination 08/27/2019, 09/24/2019, 07/17/2020   Pneumococcal Conjugate-13 12/25/2014   Pneumococcal Polysaccharide-23 09/07/2012   Td 02/14/2010   Zoster Recombinant(Shingrix) 02/28/2021, 07/11/2021    TDAP status: Due, Education has been provided regarding the importance of this vaccine. Advised may receive this vaccine at local pharmacy or Health Dept. Aware to provide a copy of the vaccination record if obtained from local pharmacy or Health Dept. Verbalized acceptance and understanding.  Flu Vaccine status: Up to date  Pneumococcal vaccine status: Up to date  Covid-19 vaccine status: Declined, Education has been provided regarding the importance of this vaccine but patient still declined. Advised may receive this vaccine at local pharmacy or Health Dept.or vaccine clinic. Aware to provide a copy of the vaccination record if obtained from local pharmacy or Health Dept. Verbalized acceptance and understanding.  Qualifies for Shingles Vaccine? No   Zostavax completed No   Shingrix Completed?: Yes  Screening Tests Health Maintenance  Topic Date Due   COVID-19 Vaccine (4 - 2024-25 season) 08/07/2023 (Originally 04/05/2023)   Medicare Annual Wellness (AWV)  07/21/2024   Pneumonia Vaccine 42+ Years old  Completed   INFLUENZA VACCINE  Completed   DEXA SCAN  Completed   Zoster Vaccines- Shingrix  Completed   HPV VACCINES  Aged Out   DTaP/Tdap/Td  Discontinued   Colonoscopy  Discontinued    Health Maintenance  There are no preventive care reminders to display for this patient.   Colorectal cancer screening: No longer required.   Mammogram status: No longer required due to  .  Bone Density status: Completed 2021. Results reflect: Bone density results: NORMAL. Repeat every 0 years.  Lung Cancer Screening: (Low  Dose CT Chest recommended if Age 62-80 years, 20 pack-year currently smoking OR have quit w/in 15years.) does not qualify.   Lung Cancer Screening Referral:   Additional Screening:  Hepatitis C Screening: does not qualify;  Vision Screening: Recommended annual ophthalmology exams for early detection of glaucoma and other disorders of the eye. Is the patient up to date with their annual eye exam?  Yes  Who is the provider or what is the name of the office in which the patient attends annual eye exams? [facility]  If pt is not established with a provider, would they like to be referred to a provider to establish care? No .   Dental Screening: Recommended annual dental exams for proper oral hygiene    Community Resource Referral / Chronic Care Management: CRR required this visit?  No   CCM required this visit?  No     Plan:     I have personally reviewed and noted the following in the patient's chart:   Medical and social history Use of alcohol, tobacco or illicit drugs  Current medications and supplements including opioid prescriptions. Patient is not currently taking opioid prescriptions. Functional ability and status Nutritional status Physical activity Advanced directives List of other physicians Hospitalizations, surgeries, and ER visits in previous 12 months Vitals Screenings to include cognitive, depression, and falls Referrals and appointments  In addition, I have reviewed and discussed with patient certain preventive protocols, quality metrics, and best practice recommendations. A written personalized care plan for preventive services as well as general preventive health recommendations were provided to patient.     Remi Haggard, LPN   [id number]/84/[id number]   After Visit Summary: (MyChart) Due to this being a telephonic visit, the after visit summary with patients personalized plan was offered to patient via MyChart   Nurse Notes:

## 2023-08-04 ENCOUNTER — Telehealth: Payer: Self-pay

## 2023-08-04 NOTE — Telephone Encounter (Signed)
 It is recommended pt or daughter consult pharmacist on how to take Prevagen appropriately with current medications.

## 2023-08-04 NOTE — Telephone Encounter (Signed)
 Reason for CRM: DONNA SECHRIST PATIENT DAUGHTER CALLED WANTED TO IF SHE CAN TAKE PREVAGAN ALONG WITH HER OTHER MEDICATIONS WOULD LIKE A CALL BACK

## 2023-08-04 NOTE — Telephone Encounter (Signed)
Attempted to contact pt and was unable to LVM 

## 2023-08-06 ENCOUNTER — Telehealth: Payer: Self-pay | Admitting: Neurology

## 2023-08-06 NOTE — Telephone Encounter (Signed)
 Pt's daughter called in wanting to find out what Dr. Arbutus Leas thinks about the pt taking Prevogen? She says it's ok to leave a voicemail if she doesn't answer

## 2023-08-06 NOTE — Telephone Encounter (Signed)
 Pt daughter called informed Dr Tat doesn't recommend it.  Its expensive and theres nothing to suggest its helpful. Pt daughter was thankful for the information

## 2023-08-13 ENCOUNTER — Telehealth: Payer: Self-pay | Admitting: Neurology

## 2023-08-13 ENCOUNTER — Ambulatory Visit: Payer: Medicare Other | Admitting: Family Medicine

## 2023-08-13 NOTE — Telephone Encounter (Signed)
 Called daughter and spoke with her abut talking to the PCP or a pharmacist considering patient is on cardio drugs as well

## 2023-08-13 NOTE — Telephone Encounter (Signed)
 Patient daughter called and states that her mother is sick and can hardly walk and very weak. She wants to know what kind of medication she can take to help with this that will not affect the medication she is on

## 2023-09-16 ENCOUNTER — Encounter: Payer: Medicare Other | Admitting: Family Medicine

## 2023-09-17 NOTE — Patient Instructions (Signed)
   It was very nice to see you today!   PLEASE NOTE:  If labs were collected or images ordered, we will inform you of  results once we have received them and reviewed. We will contact you either by echart message, or telephone call.    If we ordered any referrals today, please let us know if you have not heard from their office within the next 2 weeks. You should receive a letter via MyChart confirming if the referral was approved and their office contact information to schedule.   Please try these tips to maintain a healthy lifestyle:  Eat most of your calories during the day when you are active. Eliminate processed foods including packaged sweets (pies, cakes, cookies), reduce intake of potatoes, white bread, white pasta, and white rice. Look for whole grain options, oat flour or almond flour.  Each meal should contain half fruits/vegetables, one quarter protein, and one quarter carbs (no bigger than a computer mouse).  Cut down on sweet beverages. This includes juice, soda, and sweet tea. Also watch fruit intake, though this is a healthier sweet option, it still contains natural sugar! Limit to 3 servings daily.  Drink at least 1 glass of water with each meal and aim for at least 8 glasses per day  Exercise at least 150 minutes every week.

## 2023-09-18 ENCOUNTER — Ambulatory Visit (INDEPENDENT_AMBULATORY_CARE_PROVIDER_SITE_OTHER): Payer: Medicare Other | Admitting: Family Medicine

## 2023-09-18 ENCOUNTER — Encounter: Payer: Self-pay | Admitting: Family Medicine

## 2023-09-18 VITALS — BP 125/75 | HR 62 | Temp 98.1°F | Ht 62.0 in | Wt 139.6 lb

## 2023-09-18 DIAGNOSIS — Z23 Encounter for immunization: Secondary | ICD-10-CM

## 2023-09-18 DIAGNOSIS — I480 Paroxysmal atrial fibrillation: Secondary | ICD-10-CM | POA: Diagnosis not present

## 2023-09-18 DIAGNOSIS — Z0000 Encounter for general adult medical examination without abnormal findings: Secondary | ICD-10-CM | POA: Diagnosis not present

## 2023-09-18 DIAGNOSIS — M810 Age-related osteoporosis without current pathological fracture: Secondary | ICD-10-CM | POA: Diagnosis not present

## 2023-09-18 DIAGNOSIS — Z7901 Long term (current) use of anticoagulants: Secondary | ICD-10-CM

## 2023-09-18 DIAGNOSIS — I10 Essential (primary) hypertension: Secondary | ICD-10-CM

## 2023-09-18 DIAGNOSIS — G609 Hereditary and idiopathic neuropathy, unspecified: Secondary | ICD-10-CM | POA: Diagnosis not present

## 2023-09-18 DIAGNOSIS — E7800 Pure hypercholesterolemia, unspecified: Secondary | ICD-10-CM

## 2023-09-18 LAB — LIPID PANEL
Cholesterol: 154 mg/dL (ref 0–200)
HDL: 71.8 mg/dL (ref >39.00)
LDL Cholesterol: 62 mg/dL (ref 0–99)
NonHDL: 82.08
Total CHOL/HDL Ratio: 2
Triglycerides: 99 mg/dL (ref 0.0–149.0)
VLDL: 19.8 mg/dL (ref 0.0–40.0)

## 2023-09-18 LAB — COMPREHENSIVE METABOLIC PANEL
ALT: 5 U/L (ref 0–35)
AST: 17 U/L (ref 0–37)
Albumin: 4 g/dL (ref 3.5–5.2)
Alkaline Phosphatase: 67 U/L (ref 39–117)
BUN: 23 mg/dL (ref 6–23)
CO2: 32 meq/L (ref 19–32)
Calcium: 9.6 mg/dL (ref 8.4–10.5)
Chloride: 103 meq/L (ref 96–112)
Creatinine, Ser: 0.85 mg/dL (ref 0.40–1.20)
GFR: 63.79 mL/min (ref >60.00)
Glucose, Bld: 88 mg/dL (ref 70–99)
Potassium: 4.9 meq/L (ref 3.5–5.1)
Sodium: 139 meq/L (ref 135–145)
Total Bilirubin: 0.6 mg/dL (ref 0.2–1.2)
Total Protein: 6.3 g/dL (ref 6.0–8.3)

## 2023-09-18 LAB — CBC WITH DIFFERENTIAL/PLATELET
Basophils Absolute: 0 10*3/uL (ref 0.0–0.1)
Basophils Relative: 0.4 % (ref 0.0–3.0)
Eosinophils Absolute: 0.1 10*3/uL (ref 0.0–0.7)
Eosinophils Relative: 0.7 % (ref 0.0–5.0)
HCT: 41.8 % (ref 36.0–46.0)
Hemoglobin: 13.7 g/dL (ref 12.0–15.0)
Lymphocytes Relative: 16.8 % (ref 12.0–46.0)
Lymphs Abs: 1.3 10*3/uL (ref 0.7–4.0)
MCHC: 32.8 g/dL (ref 30.0–36.0)
MCV: 93.8 fL (ref 78.0–100.0)
Monocytes Absolute: 0.9 10*3/uL (ref 0.1–1.0)
Monocytes Relative: 11.6 % (ref 3.0–12.0)
Neutro Abs: 5.4 10*3/uL (ref 1.4–7.7)
Neutrophils Relative %: 70.5 % (ref 43.0–77.0)
Platelets: 150 10*3/uL (ref 150.0–400.0)
RBC: 4.46 Mil/uL (ref 3.87–5.11)
RDW: 13.5 % (ref 11.5–15.5)
WBC: 7.7 10*3/uL (ref 4.0–10.5)

## 2023-09-18 LAB — TSH: TSH: 1.05 u[IU]/mL (ref 0.35–5.50)

## 2023-09-18 MED ORDER — GABAPENTIN 300 MG PO CAPS
ORAL_CAPSULE | ORAL | 1 refills | Status: AC
Start: 2023-09-18 — End: ?

## 2023-09-18 MED ORDER — METOPROLOL TARTRATE 25 MG PO TABS: 25.0000 mg | ORAL_TABLET | Freq: Two times a day (BID) | ORAL | 3 refills | Status: DC

## 2023-09-18 MED ORDER — RALOXIFENE HCL 60 MG PO TABS: 60.0000 mg | ORAL_TABLET | Freq: Every day | ORAL | 1 refills | Status: DC

## 2023-09-18 NOTE — Addendum Note (Signed)
Addended by: Emi Holes D on: 09/18/2023 02:06 PM   Modules accepted: Orders

## 2023-09-18 NOTE — Progress Notes (Signed)
Office Note 09/18/2023  CC:  Chief Complaint  Patient presents with   Annual Exam   Patient is a 83 y.o. female who is here accompanied by her daughter for annual health maintenance exam and 23-month follow-up hypertension and hyperlipidemia. A/P as of last visit: "#1 hypertension, well-controlled on Lopressor 25 mg twice daily.   2.  Hypercholesterolemia, doing well on pravastatin 40 mg a day. Not fasting today. Repeat lipid panel in 6 months.   3.  Osteoporosis.  October 2022 started raloxifene for her osteoporosis. Tolerating this well.  rpt DEXA after 05/2023."  INTERIM HX: She lives with her daughter. She can ambulate with a walker.   Past Medical History:  Diagnosis Date   Abnormal mammogram of left breast    Likely benign microcalcifications--repeat L diag mammo 03/24/2017.  COMPLEX SCLEROSING LESION WITH CALCIFICATIONS --no sign of malignancy.  Excision recommended--pt has been referred to Dr. Dwain Sarna, who recommended f/u mammo and this was normal 12/2017--consider rpt 1 yr per rad.   Arthritis    DJD, back and both hips   Chronic low back pain without sciatica    Lumbar spondylosis + scoliosis.  Summer 2017 Dr. Ethelene Hal did ESI and pt got no relief.  Pt then saw Dr. Retia Passe [facility]: felt she had facet mediated pain; plan for B L345 MBB (??).   COVID 08/2022   Cystocele, midline    Diverticulosis    on colonoscopy 2004   GERD (gastroesophageal reflux disease)    Hx of cardiovascular stress test    Lexiscan Myoview (2/14):  Low risk, small ant defect likely breast attenuation, no ischemia, EF 69% (no change from 2010).     Hyperlipidemia    Hypertension    per pt   Idiopathic scoliosis of thoracolumbar region    Lumbar back pain    Mitral regurgitation    Echo (2/14):  EF 60-65%, Gr 1 DD, mild AI, mild bileaflet MVP, mod post directed MR, mild LAE, PASP 35.   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  Responding to gabapentin (Dr. Vickey Huger).   Osteoporosis, senile    stable left hip 2015 (took fosamax x 3yrs, T-score -2.7 when she stopped bisphosphonate).  Rpt DEXA 49mo later (09/2016) T-score -.3.1 (different machine, though). 11/2019 DEXA T-score -4. Recommended restart of fosamax   PAF (paroxysmal atrial fibrillation) (HCC)    Dr. [name]: Flecainide, metopr, apxiaban.  08/2017-->persistent a-fib w/RVR, flecainide increased and she converted back to NSR and felt better.  100mg  bid flecainide continued.   Parkinson's disease (HCC) 2020 dx   started sinemet 05/2019 (Dr. Arbutus Leas)   Peripheral neuropathy 2008   Idiopathic vs familial (motor>sensory) vs lumbar disc dz: gabapentin helpful   Shoulder pain left   RC surg 09/2014    Past Surgical History:  Procedure Laterality Date   BREAST BIOPSY  04/10/2017   Left breast core needle biopsy of calcifications.  COMPLEX SCLEROSING LESION WITH CALCIFICATIONS --no sign of malignancy.  Excision recommended--pt has been referred to Dr. Dwain Sarna, who recommended rpt mammo and this was NORMAL 12/2017.   BREAST EXCISIONAL BIOPSY Right 2010   Benign   BREAST SURGERY  Nov 2010   Benign biopsy, right   CHOLECYSTECTOMY  03/2010   Dr.Gross   COLONOSCOPY  10/03/2002   No polyps.  Repeat 10 yrs recommended but pt declines.  Pt declined cologuard 08/2016 but changed her mind and this test was NEG on  03/23/17.   COMBINED HYSTERECTOMY VAGINAL / OOPHORECTOMY / A&P REPAIR  1990   uncertain if ovaries were removed or not   CYSTOCELE REPAIR     DEXA  11/2019   T score -4   Lumpectomy  06/2009   "Fatty Necrosis"   PFT's  2014   NORMAL   REVERSE SHOULDER ARTHROPLASTY Right 03/11/2019   Procedure: REVERSE TOTAL SHOULDER ARTHROPLASTY;  Surgeon: Beverely Low, MD;  Location: [facility];  Service: Orthopedics;  Laterality: Right;  Intersclene block   ROTATOR CUFF REPAIR Left 10/02/14   Dr. Ranell Patrick   TONSILLECTOMY     TOTAL KNEE  ARTHROPLASTY  09/2009   Right ;Dr Charlann Boxer   TOTAL KNEE ARTHROPLASTY Left 03/10/2016   Procedure: TOTAL KNEE ARTHROPLASTY;  Surgeon: Durene Romans, MD;  Location: [facility];  Service: Orthopedics;  Laterality: Left;   TRANSTHORACIC ECHOCARDIOGRAM  09/2012; 09/27/15   2014: EF 60-65%, Gr 1 DD, mild AI, mild bileaflet MVP, mod post directed MR, mild LAE, PASP 35.  Repeat 2017: EF 55-60%, mild AR, mild MVP and mild MV regurg.   VAGINAL HYSTERECTOMY     For Uterine Deviation     Family History  Problem Relation Age of Onset   Heart attack Father        in 10s   Stroke Father 61   Ovarian cancer Mother    Heart attack Son 37       Smoker   Coronary artery disease Brother    COPD Brother    Healthy Son    Healthy Daughter    Diabetes Neg Hx     Social History   Socioeconomic History   Marital status: Widowed    Spouse name: Not on file   Number of children: 3   Years of education: HS grad   Highest education level: High school graduate  Occupational History   Occupation: Fish farm manager- bus driver- retired  Tobacco Use   Smoking status: Never   Smokeless tobacco: Never  Vaping Use   Vaping status: Never Used  Substance and Sexual Activity   Alcohol use: No    Alcohol/week: 0.0 standard drinks of alcohol   Drug use: No   Sexual activity: Never    Birth control/protection: Post-menopausal  Other Topics Concern   Not on file  Social History Narrative   Widow, has 3 children, 4 grandchildren.   Orig from [place].   Occupation; retired Surveyor, mining.  Also worked in Hydrologist.   Caffiene, 1 cup daily avg.   No tob, no alc, no drugs.   Exercise: walking, limited by bilat hip pain.   Social Drivers of Corporate investment banker Strain: Low Risk  (07/22/2023)   Overall Financial Resource Strain (CARDIA)    Difficulty of Paying Living Expenses: Not hard at all  Food Insecurity: No Food Insecurity (07/22/2023)   Hunger Vital Sign    Worried About Running Out of Food in  the Last Year: Never true    Ran Out of Food in the Last Year: Never true  Transportation Needs: No Transportation Needs (07/22/2023)   PRAPARE - Administrator, Civil Service (Medical): No    Lack of Transportation (Non-Medical): No  Physical Activity: Inactive (07/22/2023)   Exercise Vital Sign    Days of Exercise per Week: 0 days    Minutes of Exercise per Session: 0 min  Stress: No Stress Concern Present (07/22/2023)   [employer] of Occupational Health - Occupational Stress Questionnaire  Feeling of Stress : Not at all  Social Connections: Moderately Isolated (07/22/2023)   Social Connection and Isolation Panel [NHANES]    Frequency of Communication with Friends and Family: Twice a week    Frequency of Social Gatherings with Friends and Family: Three times a week    Attends Religious Services: More than 4 times per year    Active Member of Clubs or Organizations: No    Attends Banker Meetings: Never    Marital Status: Widowed  Intimate Partner Violence: Not At Risk (07/22/2023)   Humiliation, Afraid, Rape, and Kick questionnaire    Fear of Current or Ex-Partner: No    Emotionally Abused: No    Physically Abused: No    Sexually Abused: No    Outpatient Medications Prior to Visit  Medication Sig Dispense Refill   apixaban (ELIQUIS) 5 MG TABS tablet Take 1 tablet (5 mg total) by mouth 2 (two) times daily. 180 tablet 3   betamethasone dipropionate 0.05 % lotion      Biotin 5000 MCG CAPS Take 5,000 mcg by mouth daily.     Calcium Carbonate (CALCIUM 600 PO) Take 600 mg by mouth 2 (two) times daily.      carbidopa-levodopa (SINEMET CR) 50-200 MG tablet Take 1 tablet by mouth at bedtime. 90 tablet 1   carbidopa-levodopa (SINEMET IR) 25-100 MG tablet Take  2 at 8am, Take 2 at 11 am,  Take 2 at 2 pm and 1 at 5pm 630 tablet 0   cyanocobalamin (VITAMIN B12) 1000 MCG/ML injection INJECT 1 ML (1,000 MCG) INTRAMUSCULARLY EVERY 30 DAYS 10 mL 3   flecainide  (TAMBOCOR) 100 MG tablet Take 1 tablet (100 mg total) by mouth 2 (two) times daily. Please keep upcoming appointment with Dr [name] for any further refills. 180 tablet 3   fluticasone (FLONASE) 50 MCG/ACT nasal spray Place 2 sprays into both nostrils daily. 48 g 3   nitroGLYCERIN (NITROSTAT) 0.4 MG SL tablet PLACE 1 TABLET UNDER THE TONGUE EVERY 5 MINUTES AS NEEDED FOR CHEST PAIN. 25 tablet 1   Omega-3 Fatty Acids (FISH OIL) 1200 MG CAPS Take 1,000 mg by mouth 2 (two) times daily.      pravastatin (PRAVACHOL) 40 MG tablet Take 1 tablet (40 mg total) by mouth daily. 90 tablet 3   traZODone (DESYREL) 100 MG tablet Take 1 tablet (100 mg total) by mouth at bedtime. 90 tablet 1   gabapentin (NEURONTIN) 300 MG capsule TAKE 2 CAPSULES BY MOUTH TWICE A DAY 360 capsule 1   metoprolol tartrate (LOPRESSOR) 25 MG tablet Take 1 tablet (25 mg total) by mouth 2 (two) times daily. 180 tablet 3   raloxifene (EVISTA) 60 MG tablet TAKE 1 TABLET BY MOUTH EVERY DAY 90 tablet 1   No facility-administered medications prior to visit.    Allergies  Allergen Reactions   Sulfa Antibiotics Swelling    face   Tizanidine Other (See Comments)    delirium    Review of Systems  Constitutional:  Negative for appetite change, chills, fatigue and fever.  HENT:  Negative for congestion, dental problem, ear pain and sore throat.   Eyes:  Negative for discharge, redness and visual disturbance.  Respiratory:  Negative for cough, chest tightness, shortness of breath and wheezing.   Cardiovascular:  Negative for chest pain, palpitations and leg swelling.  Gastrointestinal:  Negative for abdominal pain, blood in stool, diarrhea, nausea and vomiting.  Genitourinary:  Negative for difficulty urinating, dysuria, flank pain, frequency, hematuria and  urgency.  Musculoskeletal:  Negative for arthralgias, back pain, joint swelling, myalgias and neck stiffness.  Skin:  Negative for pallor and rash.  Neurological:  Negative for  dizziness, speech difficulty, weakness and headaches.  Hematological:  Negative for adenopathy. Does not bruise/bleed easily.  Psychiatric/Behavioral:  Negative for confusion and sleep disturbance. The patient is not nervous/anxious.     PE;    09/18/2023    1:24 PM 04/23/2023   10:18 AM 03/17/2023    8:00 PM  Vitals with BMI  Height 5\' 2"  5\' 2"    Weight 139 lbs 10 oz 143 lbs 6 oz   BMI 25.53 26.22   Systolic 125 124 161  Diastolic 75 62 89  Pulse 62 61 63   Gen: Alert, well appearing.  Patient is oriented to person, place, time, and situation. AFFECT: pleasant, lucid thought and speech. ENT: Ears: EACs clear, normal epithelium.  TMs with good light reflex and landmarks bilaterally.  Eyes: no injection, icteris, swelling, or exudate.  EOMI, PERRLA. Nose: no drainage or turbinate edema/swelling.  No injection or focal lesion.  Mouth: lips without lesion/swelling.  Oral mucosa pink and moist.  Dentition intact and without obvious caries or gingival swelling.  Oropharynx without erythema, exudate, or swelling.  Neck: supple/nontender.  No LAD, mass, or TM.  Carotid pulses 2+ bilaterally, without bruits. CV: RRR, no m/r/g.   LUNGS: CTA bilat, nonlabored resps, good aeration in all lung fields. ABD: soft, NT, ND, BS normal.  No hepatospenomegaly or mass.  No bruits. EXT: no clubbing, cyanosis, or edema.  Musculoskeletal: no joint swelling, erythema, warmth, or tenderness.  ROM of all joints intact. Skin - no sores or suspicious lesions or rashes or color changes  Pertinent labs:  Lab Results  Component Value Date   TSH 0.80 06/02/2022   Lab Results  Component Value Date   WBC 6.1 03/17/2023   HGB 13.4 03/17/2023   HCT 40.7 03/17/2023   MCV 94.0 03/17/2023   PLT 146 (L) 03/17/2023   Lab Results  Component Value Date   CREATININE 0.81 03/17/2023   BUN 20 03/17/2023   NA 139 03/17/2023   K 3.9 03/17/2023   CL 104 03/17/2023   CO2 28 03/17/2023   Lab Results  Component Value  Date   ALT 6 03/17/2023   AST 18 03/17/2023   ALKPHOS 64 03/17/2023   BILITOT 0.4 03/17/2023   Lab Results  Component Value Date   CHOL 174 09/08/2022   Lab Results  Component Value Date   HDL 72.30 09/08/2022   Lab Results  Component Value Date   LDLCALC 87 09/08/2022   Lab Results  Component Value Date   TRIG 73.0 09/08/2022   Lab Results  Component Value Date   CHOLHDL 2 09/08/2022   ASSESSMENT AND PLAN:   #1 health maintenance exam: Reviewed age and gender appropriate health maintenance issues (prudent diet, regular exercise, health risks of tobacco and excessive alcohol, use of seatbelts, fire alarms in home, use of sunscreen).  Also reviewed age and gender appropriate health screening as well as vaccine recommendations. Immunizations: She declines any further Tdap.  Flu shot today. Otherwise vaccines are UTD. LABS: cbc,cmet,tsh lipids Breast ca screening: mammogram UTD as of 01/2023-->followed by Dr. Dwain Sarna. Colon cancer screening: cologuard NEG 10/2021.  No further colon cancer screening indicated.  #2 hypertension, well-controlled on Lopressor 25 mg twice daily. Electrolytes and creatinine today.  3.  Hypercholesterolemia, doing well on pravastatin 40 mg a day. Not fasting today. Lipid  panel and hepatic panel today.   4.  Osteoporosis.  October 2022 started raloxifene for her osteoporosis. Tolerating this well.  rpt DEXA ordered today, to be scheduled with her mammogram in June this year.  An After Visit Summary was printed and given to the patient.  FOLLOW UP:  Return in about 6 months (around 03/17/2024) for routine chronic illness f/u.  Signed:  Santiago Bumpers, MD           09/18/2023

## 2023-09-21 ENCOUNTER — Other Ambulatory Visit: Payer: Self-pay | Admitting: Family Medicine

## 2023-09-21 DIAGNOSIS — Z0000 Encounter for general adult medical examination without abnormal findings: Secondary | ICD-10-CM

## 2023-09-24 ENCOUNTER — Other Ambulatory Visit: Payer: Self-pay | Admitting: Neurology

## 2023-10-21 NOTE — Progress Notes (Unsigned)
 Assessment/Plan:   1.  Parkinsons Disease  -Continue carbidopa/levodopa to 2 at 8am, 11am, 2pm and 1 at 5pm.    -continue carbidopa/levodopa 50/200 CR at bed   -discussed exercise - encouraged her to start!  -We discussed that it used to be thought that levodopa would increase risk of melanoma but now it is believed that Parkinsons itself likely increases risk of melanoma. she is to get regular skin checks.  She is doing this.  -Discussed walker types and told her that I really do not like the rollator.  She did really well with the platform U step!  -refer to PT  -Information given to daughter to be placed on our email list for support groups  2.  Insomnia  -continue trazodone, 100 mg q hs.  Risk, benefits, side effects discussed.  -needs to f/u with pcp to talk about bladder/nocturia as that is what is waking her up.  I have tried to raise her medication for sleep and that isn't helping b/c its not a primary sleep issue.  3.  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 Subjective:   Cassie White was seen today in follow up for Parkinsons disease.  My previous records were reviewed prior to todays visit as well as outside records available to me.  The patient with daughter who supplements the hx. we slightly increased her levodopa and moved it to qid dosing due to perceived wearing off.  I had actually seen her at a time last visit where she should have been wearing off, but did not really notice it, but nonetheless we decided to try and increase it to help the patient's reported symptoms.  Despite increasing it, the patients daughter called back after our last visit and asked to increase it more, which we did not do.  Patient is not exercising.  She reports that she just does not have the motivation to do so.  Her trazodone was increased last visit for insomnia to 100 mg.  I did tell her that if that was not helpful, she needed  to talk to other physicians as it was nocturia that was waking her up and keeping her up.  She did see her primary care doctor on February 14, although I do not see that bladder issues were addressed.  She continues to c/o inability to sleep after waking up to go to the bathroom.    Current prescribed movement disorder medications: Carbidopa/levodopa 25/100, 2 at 8 AM/11 AM/2 PM and 1 at 5 PM (increased) carbidopa/levodopa 50/200 at bed Trazodone, 1000 mg at bedtime  PREVIOUS MEDICATIONS: Sinemet  ALLERGIES:   Allergies  Allergen Reactions   Sulfa Antibiotics Swelling    face   Tizanidine Other (See Comments)    delirium    CURRENT MEDICATIONS:  Outpatient Encounter Medications as of 10/23/2023  Medication Sig   apixaban (ELIQUIS) 5 MG TABS tablet Take 1 tablet (5 mg total) by mouth 2 (two) times daily.   betamethasone dipropionate 0.05 % lotion    Biotin 5000 MCG CAPS Take 5,000 mcg by mouth daily.   Calcium Carbonate (CALCIUM 600 PO) Take 600 mg by mouth 2 (two) times daily.    carbidopa-levodopa (SINEMET CR) 50-200 MG tablet Take 1 tablet by mouth at bedtime.   carbidopa-levodopa (SINEMET IR) 25-100 MG tablet Take  2 at 8am, Take 2 at 11 am,  Take 2 at 2 pm and 1 at 5pm  cyanocobalamin (VITAMIN B12) 1000 MCG/ML injection INJECT 1 ML (1,000 MCG) INTRAMUSCULARLY EVERY 30 DAYS   flecainide (TAMBOCOR) 100 MG tablet Take 1 tablet (100 mg total) by mouth 2 (two) times daily. Please keep upcoming appointment with Dr [name] for any further refills.   fluticasone (FLONASE) 50 MCG/ACT nasal spray Place 2 sprays into both nostrils daily.   gabapentin (NEURONTIN) 300 MG capsule TAKE 2 CAPSULES BY MOUTH TWICE A DAY   metoprolol tartrate (LOPRESSOR) 25 MG tablet Take 1 tablet (25 mg total) by mouth 2 (two) times daily.   nitroGLYCERIN (NITROSTAT) 0.4 MG SL tablet PLACE 1 TABLET UNDER THE TONGUE EVERY 5 MINUTES AS NEEDED FOR CHEST PAIN.   Omega-3 Fatty Acids (FISH OIL) 1200 MG CAPS Take 1,000 mg  by mouth 2 (two) times daily.    pravastatin (PRAVACHOL) 40 MG tablet Take 1 tablet (40 mg total) by mouth daily.   raloxifene (EVISTA) 60 MG tablet Take 1 tablet (60 mg total) by mouth daily.   traZODone (DESYREL) 100 MG tablet TAKE 1 TABLET BY MOUTH EVERYDAY AT BEDTIME   No facility-administered encounter medications on file as of 10/23/2023.    Objective:   PHYSICAL EXAMINATION:    VITALS:   Vitals:   10/23/23 1448  BP: 124/66  Pulse: 66  SpO2: 98%    GEN:  The patient appears stated age and is in NAD. HEENT:  Normocephalic, atraumatic.  The mucous membranes are moist. The superficial temporal arteries are without ropiness or tenderness. CV:  rrr Lungs:  CTAB Neck/HEME:  There are no carotid bruits bilaterally.  Neurological examination:  Orientation: The patient is alert and oriented x3.  Looks to daughter for finer aspects of hx Cranial nerves: There is good facial symmetry with min facial hypomimia. The speech is fluent and clear. Soft palate rises symmetrically and there is no tongue deviation. Hearing is intact to conversational tone. Sensation: Sensation is intact to light touch throughout Motor: Strength is at least antigravity x4.  Movement examination: Tone: There is nl tone in the UE/LE Abnormal movements: no rest tremor Coordination:  There is no decremation with RAM's, with any form of RAMS, including alternating supination and pronation of the forearm, hand opening and closing, finger taps, heel taps and toe taps. Gait and Station: The patient pushes off to arise.  The patient is given an upright/platform walker and she actually walked fairly well with it after becoming more comfortable with it.  She does turn en bloc.  I have reviewed and interpreted the following labs independently    Chemistry      Component Value Date/Time   NA 139 09/18/2023 1343   NA 145 (H) 06/02/2018 1514   K 4.9 09/18/2023 1343   CL 103 09/18/2023 1343   CO2 32 09/18/2023 1343    BUN 23 09/18/2023 1343   BUN 18 06/02/2018 1514   CREATININE 0.85 09/18/2023 1343      Component Value Date/Time   CALCIUM 9.6 09/18/2023 1343   ALKPHOS 67 09/18/2023 1343   AST 17 09/18/2023 1343   ALT 5 09/18/2023 1343   BILITOT 0.6 09/18/2023 1343       Lab Results  Component Value Date   WBC 7.7 09/18/2023   HGB 13.7 09/18/2023   HCT 41.8 09/18/2023   MCV 93.8 09/18/2023   PLT 150.0 09/18/2023    Lab Results  Component Value Date   TSH 1.05 09/18/2023     Total time spent on today's visit was 34 minutes, including  both face-to-face time and nonface-to-face time.  Time included that spent on review of records (prior notes available to me/labs/imaging if pertinent), discussing treatment and goals, answering patient's questions and coordinating care.  Cc:  Jeoffrey Massed, MD

## 2023-10-23 ENCOUNTER — Ambulatory Visit: Payer: Self-pay | Admitting: Neurology

## 2023-10-23 ENCOUNTER — Ambulatory Visit (INDEPENDENT_AMBULATORY_CARE_PROVIDER_SITE_OTHER)

## 2023-10-23 ENCOUNTER — Ambulatory Visit: Admitting: Podiatry

## 2023-10-23 ENCOUNTER — Encounter: Payer: Self-pay | Admitting: Podiatry

## 2023-10-23 VITALS — BP 124/66 | HR 66 | Ht 62.0 in | Wt 143.0 lb

## 2023-10-23 DIAGNOSIS — G20A1 Parkinson's disease without dyskinesia, without mention of fluctuations: Secondary | ICD-10-CM

## 2023-10-23 DIAGNOSIS — M79672 Pain in left foot: Secondary | ICD-10-CM

## 2023-10-23 DIAGNOSIS — R351 Nocturia: Secondary | ICD-10-CM | POA: Diagnosis not present

## 2023-10-23 DIAGNOSIS — M79674 Pain in right toe(s): Secondary | ICD-10-CM | POA: Diagnosis not present

## 2023-10-23 DIAGNOSIS — M79675 Pain in left toe(s): Secondary | ICD-10-CM

## 2023-10-23 DIAGNOSIS — B351 Tinea unguium: Secondary | ICD-10-CM

## 2023-10-23 DIAGNOSIS — Z7901 Long term (current) use of anticoagulants: Secondary | ICD-10-CM

## 2023-10-23 DIAGNOSIS — L84 Corns and callosities: Secondary | ICD-10-CM

## 2023-10-23 DIAGNOSIS — M216X2 Other acquired deformities of left foot: Secondary | ICD-10-CM

## 2023-10-25 ENCOUNTER — Emergency Department (HOSPITAL_BASED_OUTPATIENT_CLINIC_OR_DEPARTMENT_OTHER)
Admission: EM | Admit: 2023-10-25 | Discharge: 2023-10-25 | Disposition: A | Discharge status: Home / Self Care | Attending: Emergency Medicine | Admitting: Emergency Medicine

## 2023-10-25 ENCOUNTER — Emergency Department (HOSPITAL_BASED_OUTPATIENT_CLINIC_OR_DEPARTMENT_OTHER)

## 2023-10-25 ENCOUNTER — Encounter (HOSPITAL_BASED_OUTPATIENT_CLINIC_OR_DEPARTMENT_OTHER): Payer: Self-pay

## 2023-10-25 DIAGNOSIS — G20C Parkinsonism, unspecified: Secondary | ICD-10-CM | POA: Diagnosis not present

## 2023-10-25 DIAGNOSIS — Z96651 Presence of right artificial knee joint: Secondary | ICD-10-CM | POA: Insufficient documentation

## 2023-10-25 DIAGNOSIS — M85861 Other specified disorders of bone density and structure, right lower leg: Secondary | ICD-10-CM | POA: Diagnosis not present

## 2023-10-25 DIAGNOSIS — M25561 Pain in right knee: Secondary | ICD-10-CM | POA: Insufficient documentation

## 2023-10-25 DIAGNOSIS — M79604 Pain in right leg: Secondary | ICD-10-CM | POA: Insufficient documentation

## 2023-10-25 DIAGNOSIS — Z7901 Long term (current) use of anticoagulants: Secondary | ICD-10-CM | POA: Diagnosis not present

## 2023-10-25 MED ORDER — ACETAMINOPHEN 500 MG PO TABS
1000.0000 mg | ORAL_TABLET | Freq: Once | ORAL | Status: AC
  Administered 2023-10-25: 1000 mg via ORAL
  Filled 2023-10-25: qty 2

## 2023-10-25 MED ORDER — LIDOCAINE 5 % EX PTCH
1.0000 | MEDICATED_PATCH | CUTANEOUS | Status: DC
  Administered 2023-10-25: 1 via TRANSDERMAL
  Filled 2023-10-25: qty 1

## 2023-10-25 MED ORDER — LIDOCAINE 5 % EX PTCH: 1.0000 | MEDICATED_PATCH | CUTANEOUS | 0 refills | Status: DC

## 2023-10-25 MED ORDER — METHYLPREDNISOLONE 4 MG PO TBPK: ORAL_TABLET | ORAL | 0 refills | Status: DC

## 2023-10-25 NOTE — Progress Notes (Signed)
 Subjective: Chief Complaint  Patient presents with   Foot Pain    RM#14 Left foot pain on the bottom of foot has area that is causing some discomfort.   83 year old female presents the office with concerns of pain to the bottom of left foot which are causing discomfort.  No open lesions or any drainage.  No injuries.  She also states that her nails are thickened elongated she cannot trim them herself and are causing discomfort.  No recent injury or changes otherwise.  She is on Eliquis  Objective: AAO x3, NAD DP/PT pulses palpable bilaterally, CRT less than 3 seconds Hyperkeratotic lesions of the left foot submetatarsal without any underlying ulceration, drainage or signs of infection.  There are some mild dried blood present there is no skin breakdown or signs of infection.  There is no open lesions bilaterally. Nails are hypertrophic, dystrophic, brittle, discolored, elongated 10. No surrounding redness or drainage. Tenderness nails 1-5 bilaterally.  Prominent metatarsal heads noted with hammertoe contractures present bilaterally. No pain with calf compression, swelling, warmth, erythema  Assessment: Symptomatic onychomycosis, hyperkeratotic preulcerative lesion left foot  Plan: Radiology: 3 views left foot were obtained.  There is no evidence of acute fracture.  Digital deformities noted.  No evidence of foreign body. Lisfranc arthritis. Heel spur.   Symptomatic onychomycosis: Sharply debrided nails x 10 without any complications or bleeding.  Preulcerative callus Sharply debrided x 1 without any complications or bleeding.  Continue moisturizer, offloading.  Metatarsal pads.  Vivi Barrack DPM

## 2023-10-25 NOTE — ED Triage Notes (Signed)
 C/o pain to anterior right leg under knee. Denies injury. Sensitive to touch. Hx of parkinsons

## 2023-10-25 NOTE — ED Provider Notes (Signed)
 [place] [facility] Provider Note   CSN: [id number] Arrival date & time: 10/25/23  1038     History  Chief Complaint  Patient presents with   Leg Pain    Cassie White is a 83 y.o. female.  Patient with pain to the right anterior knee and it difficult to sleep.  Nothing makes it worse or better.  History of Parkinson's.  On blood thinners.  Denies any trauma.  Denies any fever or chills.  She uses a wheelchair at times.  She is able to flex and extend at the knee without much issue pain is just below the right knee where she has had replacement of the knee.  The history is provided by the patient.       Home Medications Prior to Admission medications   Medication Sig Start Date End Date Taking? Authorizing Provider  lidocaine (LIDODERM) 5 % Place 1 patch onto the skin daily. Remove & Discard patch within 12 hours or as directed by MD 10/25/23  Yes Lockie Mola, Maxx Calaway, DO  methylPREDNISolone (MEDROL DOSEPAK) 4 MG TBPK tablet Follow package insert 10/25/23  Yes Elchonon Maxson, DO  apixaban (ELIQUIS) 5 MG TABS tablet Take 1 tablet (5 mg total) by mouth 2 (two) times daily. 12/04/22   [name], Peter M, MD  betamethasone dipropionate 0.05 % lotion  02/26/22   [provider]  Biotin 5000 MCG CAPS Take 5,000 mcg by mouth daily.    [provider]  Calcium Carbonate (CALCIUM 600 PO) Take 600 mg by mouth 2 (two) times daily.     [provider]  carbidopa-levodopa (SINEMET CR) 50-200 MG tablet Take 1 tablet by mouth at bedtime. 04/23/23   Tat, Octaviano Batty, DO  carbidopa-levodopa (SINEMET IR) 25-100 MG tablet Take  2 at 8am, Take 2 at 11 am,  Take 2 at 2 pm and 1 at 5pm 04/27/23   Tat, Lurena Joiner S, DO  cyanocobalamin (VITAMIN B12) 1000 MCG/ML injection INJECT 1 ML (1,000 MCG) INTRAMUSCULARLY EVERY 30 DAYS 09/08/22   [name], Maryjean Morn, MD  flecainide (TAMBOCOR) 100 MG tablet Take 1 tablet (100 mg total) by mouth 2 (two) times daily. Please  keep upcoming appointment with Dr [name] for any further refills. 12/04/22   [name], Peter M, MD  fluticasone [facility]) 50 MCG/ACT nasal spray Place 2 sprays into both nostrils daily. 09/08/22   [name], Maryjean Morn, MD  gabapentin (NEURONTIN) 300 MG capsule TAKE 2 CAPSULES BY MOUTH TWICE A DAY 09/18/23   [name], Maryjean Morn, MD  metoprolol tartrate (LOPRESSOR) 25 MG tablet Take 1 tablet (25 mg total) by mouth 2 (two) times daily. 09/18/23   [name], Maryjean Morn, MD  nitroGLYCERIN (NITROSTAT) 0.4 MG SL tablet PLACE 1 TABLET UNDER THE TONGUE EVERY 5 MINUTES AS NEEDED FOR CHEST PAIN. 12/18/22   [name], Maryjean Morn, MD  Omega-3 Fatty Acids (FISH OIL) 1200 MG CAPS Take 1,000 mg by mouth 2 (two) times daily.     [provider]  pravastatin (PRAVACHOL) 40 MG tablet Take 1 tablet (40 mg total) by mouth daily. 12/04/22   [name], Peter M, MD  raloxifene (EVISTA) 60 MG tablet Take 1 tablet (60 mg total) by mouth daily. 09/18/23   Jeoffrey Massed, MD  traZODone (DESYREL) 100 MG tablet TAKE 1 TABLET BY MOUTH EVERYDAY AT BEDTIME 09/25/23   Tat, Octaviano Batty, DO      Allergies    Sulfa antibiotics and Tizanidine    Review of Systems  Review of Systems  Physical Exam Updated Vital Signs BP 138/64 (BP Location: Right Arm)   Pulse 62   Temp (!) 97.5 F (36.4 C) (Oral)   Resp 18   Ht 5\' 2"  (1.575 m)   Wt 62.6 kg   SpO2 95%   BMI 25.24 kg/m  Physical Exam Vitals and nursing note reviewed.  Constitutional:      General: She is not in acute distress.    Appearance: She is well-developed. She is not ill-appearing.  HENT:     Head: Normocephalic and atraumatic.     Nose: Nose normal.     Mouth/Throat:     Mouth: Mucous membranes are moist.  Eyes:     Extraocular Movements: Extraocular movements intact.     Conjunctiva/sclera: Conjunctivae normal.     Pupils: Pupils are equal, round, and reactive to light.  Cardiovascular:     Rate and Rhythm: Normal rate and regular rhythm.     Pulses: Normal pulses.      Heart sounds: Normal heart sounds. No murmur heard. Pulmonary:     Effort: Pulmonary effort is normal. No respiratory distress.     Breath sounds: Normal breath sounds.  Abdominal:     Palpations: Abdomen is soft.     Tenderness: There is no abdominal tenderness.  Musculoskeletal:        General: Tenderness present. No swelling.     Cervical back: Normal range of motion and neck supple.     Comments: Tenderness over, the right patellar tendon insertion at the proximal tibia, there is no redness swelling she able to flex and extend the knee without any issues  Skin:    General: Skin is warm and dry.     Capillary Refill: Capillary refill takes less than 2 seconds.  Neurological:     General: No focal deficit present.     Mental Status: She is alert.     Sensory: No sensory deficit.     Motor: No weakness.  Psychiatric:        Mood and Affect: Mood normal.     ED Results / Procedures / Treatments   Labs (all labs ordered are listed, but only abnormal results are displayed) Labs Reviewed - No data to display  EKG None  Radiology DG Knee Complete 4 Views Right Result Date: 10/25/2023 CLINICAL DATA:  Right leg pain. EXAM: RIGHT KNEE - COMPLETE 4+ VIEW COMPARISON:  None Available. FINDINGS: Right knee arthroplasty. Osteopenia. No acute osseous abnormality. No joint effusion. IMPRESSION: No acute findings. Electronically Signed   By: Leanna Battles M.D.   On: 10/25/2023 11:15   DG Foot Complete Left Result Date: 10/23/2023 Please see detailed radiograph report in office note.   Procedures Procedures    Medications Ordered in ED Medications  lidocaine (LIDODERM) 5 % 1 patch (1 patch Transdermal Patch Applied 10/25/23 1108)  acetaminophen (TYLENOL) tablet 1,000 mg (1,000 mg Oral Given 10/25/23 1106)    ED Course/ Medical Decision Making/ A&P                                 Medical Decision Making Amount and/or Complexity of Data Reviewed Radiology: ordered.  Risk OTC  drugs. Prescription drug management.   [name] is here with right knee pain.  History of knee replacement in the past history of Parkinson's.  Differential diagnosis likely some inflammation seems less likely to be fracture.  There is  no infectious process on exam.  She is neurovascular neuromuscular intact on exam.  She got good pulses.  She is on blood thinner and I also have no concern for DVT given history and physical.  Overall she spot tender over the patellar tendon and I think this is likely some tendinitis.  She able to flex and extend the knee without any major difficulty.  I have no concern for septic joint.  X-ray was obtained that showed no acute fracture or malalignment.  I gave her Tylenol lidocaine patch recommend ice and rest and follow-up with primary care doctor.  Discharged in good condition.  This chart was dictated using voice recognition software.  Despite best efforts to proofread,  errors can occur which can change the documentation meaning.         Final Clinical Impression(s) / ED Diagnoses Final diagnoses:  Right leg pain    Rx / DC Orders ED Discharge Orders          Ordered    lidocaine (LIDODERM) 5 %  Every 24 hours        10/25/23 1118    methylPREDNISolone (MEDROL DOSEPAK) 4 MG TBPK tablet        10/25/23 1118              Mykeria Garman, DO 10/25/23 1119

## 2023-10-25 NOTE — Discharge Instructions (Signed)
 Recommend 650 mg of Tylenol every 6 hours as needed for pain.  Take anti-inflammatory medicine Medrol Dosepak as prescribed.  Consider buying over-the-counter lidocaine patches or buying prescription lidocaine patches that I have written for whichever one is cheaper.  Recommend ice as well.  Follow-up with her primary care doctor.

## 2023-11-03 ENCOUNTER — Other Ambulatory Visit: Payer: Self-pay

## 2023-11-03 ENCOUNTER — Ambulatory Visit: Attending: Neurology | Admitting: Physical Therapy

## 2023-11-03 DIAGNOSIS — M6281 Muscle weakness (generalized): Secondary | ICD-10-CM | POA: Insufficient documentation

## 2023-11-03 DIAGNOSIS — Z9181 History of falling: Secondary | ICD-10-CM | POA: Diagnosis not present

## 2023-11-03 DIAGNOSIS — G20A1 Parkinson's disease without dyskinesia, without mention of fluctuations: Secondary | ICD-10-CM | POA: Diagnosis not present

## 2023-11-03 DIAGNOSIS — R2681 Unsteadiness on feet: Secondary | ICD-10-CM | POA: Insufficient documentation

## 2023-11-03 NOTE — Therapy (Signed)
 OUTPATIENT PHYSICAL THERAPY LOWER EXTREMITY EVALUATION   Patient Name: Cassie White MRN: [id number] DOB:[birth year], 83 y.o., female Today's Date: 11/03/2023  END OF SESSION:  PT End of Session - 11/03/23 1336     Visit Number 1    Number of Visits 12    Date for PT Re-Evaluation 12/15/23    PT Start Time 0101    PT Stop Time 0132    PT Time Calculation (min) 31 min    Activity Tolerance Patient tolerated treatment well    Behavior During Therapy [facility] for tasks assessed/performed             Past Medical History:  Diagnosis Date   Abnormal mammogram of left breast    Likely benign microcalcifications--repeat L diag mammo 03/24/2017.  COMPLEX SCLEROSING LESION WITH CALCIFICATIONS --no sign of malignancy.  Excision recommended--pt has been referred to Dr. Dwain Sarna, who recommended f/u mammo and this was normal 12/2017--consider rpt 1 yr per rad.   Arthritis    DJD, back and both hips   Chronic low back pain without sciatica    Lumbar spondylosis + scoliosis.  Summer 2017 Dr. Ethelene Hal did ESI and pt got no relief.  Pt then saw Dr. Retia Passe [facility]: felt she had facet mediated pain; plan for B L345 MBB (??).   COVID 08/2022   Cystocele, midline    Diverticulosis    on colonoscopy 2004   GERD (gastroesophageal reflux disease)    Hx of cardiovascular stress test    Lexiscan Myoview (2/14):  Low risk, small ant defect likely breast attenuation, no ischemia, EF 69% (no change from 2010).     Hyperlipidemia    Hypertension    per pt   Idiopathic scoliosis of thoracolumbar region    Lumbar back pain    Mitral regurgitation    Echo (2/14):  EF 60-65%, Gr 1 DD, mild AI, mild bileaflet MVP, mod post directed MR, mild LAE, PASP 35.   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  Responding to gabapentin (Dr. Vickey Huger).   Osteoporosis, senile    stable left hip 2015 (took fosamax x 11yrs,  T-score -2.7 when she stopped bisphosphonate).  Rpt DEXA 14mo later (09/2016) T-score -.3.1 (different machine, though). 11/2019 DEXA T-score -4. Recommended restart of fosamax   PAF (paroxysmal atrial fibrillation) (HCC)    Dr. [name]: Flecainide, metopr, apxiaban.  08/2017-->persistent a-fib w/RVR, flecainide increased and she converted back to NSR and felt better.  100mg  bid flecainide continued.   Parkinson's disease (HCC) 2020 dx   started sinemet 05/2019 (Dr. Arbutus Leas)   Peripheral neuropathy 2008   Idiopathic vs familial (motor>sensory) vs lumbar disc dz: gabapentin helpful   Shoulder pain left   RC surg 09/2014   Past Surgical History:  Procedure Laterality Date   BREAST BIOPSY  04/10/2017   Left breast core needle biopsy of calcifications.  COMPLEX SCLEROSING LESION WITH CALCIFICATIONS --no sign of malignancy.  Excision recommended--pt has been referred to Dr. Dwain Sarna, who recommended rpt mammo and this was NORMAL 12/2017.   BREAST EXCISIONAL BIOPSY Right 2010   Benign   BREAST SURGERY  Nov 2010   Benign biopsy, right   CHOLECYSTECTOMY  03/2010   Dr.Gross   COLONOSCOPY  10/03/2002   No polyps.  Repeat 10 yrs recommended but pt declines.  Pt declined cologuard 08/2016 but changed her mind and this test was NEG on 03/23/17.   COMBINED HYSTERECTOMY VAGINAL /  OOPHORECTOMY / A&P REPAIR  1990   uncertain if ovaries were removed or not   CYSTOCELE REPAIR     DEXA  11/2019   T score -4   Lumpectomy  06/2009   "Fatty Necrosis"   PFT's  2014   NORMAL   REVERSE SHOULDER ARTHROPLASTY Right 03/11/2019   Procedure: REVERSE TOTAL SHOULDER ARTHROPLASTY;  Surgeon: Beverely Low, MD;  Location: [facility];  Service: Orthopedics;  Laterality: Right;  Intersclene block   ROTATOR CUFF REPAIR Left 10/02/14   Dr. Ranell Patrick   TONSILLECTOMY     TOTAL KNEE ARTHROPLASTY  09/2009   Right ;Dr Charlann Boxer   TOTAL KNEE ARTHROPLASTY Left 03/10/2016   Procedure: TOTAL KNEE ARTHROPLASTY;  Surgeon: Durene Romans, MD;  Location: [facility]  [facility];  Service: Orthopedics;  Laterality: Left;   TRANSTHORACIC ECHOCARDIOGRAM  09/2012; 09/27/15   2014: EF 60-65%, Gr 1 DD, mild AI, mild bileaflet MVP, mod post directed MR, mild LAE, PASP 35.  Repeat 2017: EF 55-60%, mild AR, mild MVP and mild MV regurg.   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   Snoring 05/19/2017   S/P knee replacement 03/10/2016   Female bladder prolapse 12/06/2015   Paroxysmal atrial fibrillation with rapid ventricular response (HCC) 09/28/2015   Maxillary sinusitis, acute 08/27/2015   Mitral insufficiency 05/12/2014   HTN (hypertension) 04/28/2014   Lactic acidosis 09/07/2013   Mitral valve prolapse 06/14/2013   Chest pain, exertional 09/13/2012   Pernicious anemia 09/07/2012   DIVERTICULOSIS, COLON 09/06/2009   Osteoporosis 09/06/2009   Hyperlipidemia 09/04/2009   Peripheral sensory neuropathy 09/04/2009   LOW BACK PAIN, CHRONIC 04/08/2007   REFERRING PROVIDER: Lurena Joiner Tat DO  REFERRING DIAG: Parkinson  THERAPY DIAG:  Muscle weakness (generalized)  Rationale for Evaluation and Treatment: Rehabilitation  ONSET DATE: Ongoing.  Approximately 4 years ago.    SUBJECTIVE:   SUBJECTIVE STATEMENT: The patient presents to the clinic with her daughter, Cassie White.  He CC today is feeling weak.  She has been using a Rolator and reports no falls over the last 6 months.     PERTINENT HISTORY: Please see above.   PAIN:  Are you having pain? Yes: NPRS scale: Back pain. Pain location: LB Pain description: Ache. Aggravating factors: Walking and  bending. Relieving factors: Sitting.  PRECAUTIONS: Fall  RED FLAGS: None   WEIGHT BEARING RESTRICTIONS: No  FALLS:  Has patient fallen in last 6 months? No  LIVING ENVIRONMENT: Lives with: lives with their family Lives in: House/apartment Stairs:  Avoids stairs.   Has following equipment at home: Dan Humphreys - 4 wheeled  OCCUPATION: Retired.    PLOF: Independent with household mobility with device  PATIENT GOALS: Get stronger and get around better.   OBJECTIVE:  Note: Objective measures were completed at Evaluation unless otherwise noted.   PATIENT SURVEYS:  TUG 32 seconds    POSTURE: rounded shoulders, forward head, decreased lumbar lordosis, increased thoracic kyphosis, flexed trunk , and right knee genu valgum.    LOWER EXTREMITY ROM:  WFL.  LOWER EXTREMITY MMT:  MMT Right eval Left eval  Hip flexion 4-/5 4/5  Hip extension    Hip abduction    Hip adduction    Hip internal rotation    Hip external rotation    Knee flexion 4+/5 4+/5  Knee extension 4+/5 4+/5  Ankle dorsiflexion 4/5 4/5  Ankle plantarflexion    Ankle inversion    Ankle eversion     (Blank rows = not tested)   FUNCTIONAL TESTS:  5 times sit to stand: 22 seconds Timed up and go (TUG): 32 seconds  GAIT: The patient is walking with a shuffled gait pattern using a 4-wheeled walker.    PATIENT EDUCATION:  Education details:  Person educated:    International aid/development worker:    Education comprehension:     HOME EXERCISE PROGRAM:   ASSESSMENT:  CLINICAL IMPRESSION: The patient presents to [facility] with a diagnosis of Parkinson.  Her CC is that of weakness  Manual muscle testing assessment confirms this.  Her TUG is 32 seconds and 5 time sit to stand test is 22 secomds.  She exhibits multiple postural abnormalities.  She walks with a 4-wheeled walker with a shuffled gait pattern.  .Patient will benefit from skilled physical therapy intervention to address deficits.   OBJECTIVE IMPAIRMENTS:  Abnormal gait, decreased activity tolerance, decreased mobility, difficulty walking, decreased strength, and pain.   ACTIVITY LIMITATIONS: carrying, lifting, bending, and locomotion level  PARTICIPATION LIMITATIONS: meal prep, cleaning, laundry, and community activity  PERSONAL FACTORS: Time since onset of injury/illness/exacerbation and 1-2 comorbidities: parkinson, bilateral TKA's  are also affecting patient's functional outcome.   REHAB POTENTIAL: Fair    CLINICAL DECISION MAKING: Evolving/moderate complexity  EVALUATION COMPLEXITY: Moderate   GOALS:  LONG TERM GOALS: Target date: 12/15/23  Ind with a HEP.  Goal status: INITIAL  2.  Patient improve 5 times sit to stand test by 5 seconds.  Goal status: INITIAL  3.  Patient improve TUG by 7 seconds.:  Goal status: INITIAL     PLAN:  PT FREQUENCY: 2x/week  PT DURATION: 6 weeks  PLANNED INTERVENTIONS: 97110-Therapeutic exercises, 97530- Therapeutic activity, [id number]- Neuromuscular re-education, 97535- Self Care, and 62952- Manual therapy  PLAN FOR NEXT SESSION: Nustep, LE strengthening.   Yossef Gilkison, [place], PT 11/03/2023, 2:25 PM

## 2023-11-06 ENCOUNTER — Other Ambulatory Visit: Payer: Self-pay | Admitting: Family Medicine

## 2023-11-09 ENCOUNTER — Encounter

## 2023-11-12 ENCOUNTER — Ambulatory Visit

## 2023-11-12 DIAGNOSIS — M6281 Muscle weakness (generalized): Secondary | ICD-10-CM

## 2023-11-12 DIAGNOSIS — Z9181 History of falling: Secondary | ICD-10-CM | POA: Diagnosis not present

## 2023-11-12 DIAGNOSIS — R2681 Unsteadiness on feet: Secondary | ICD-10-CM | POA: Diagnosis not present

## 2023-11-12 DIAGNOSIS — G20A1 Parkinson's disease without dyskinesia, without mention of fluctuations: Secondary | ICD-10-CM | POA: Diagnosis not present

## 2023-11-12 NOTE — Therapy (Signed)
 OUTPATIENT PHYSICAL THERAPY LOWER EXTREMITY EVALUATION   Patient Name: Cassie White MRN: [id number] DOB:1940-10-16, 83 y.o., female Today's Date: 11/12/2023  END OF SESSION:  PT End of Session - 11/12/23 1108     Visit Number 2    Number of Visits 12    Date for PT Re-Evaluation 12/15/23    PT Start Time 1100    PT Stop Time 1142    PT Time Calculation (min) 42 min    Activity Tolerance Patient tolerated treatment well    Behavior During Therapy [facility] for tasks assessed/performed             Past Medical History:  Diagnosis Date   Abnormal mammogram of left breast    Likely benign microcalcifications--repeat L diag mammo 03/24/2017.  COMPLEX SCLEROSING LESION WITH CALCIFICATIONS --no sign of malignancy.  Excision recommended--pt has been referred to Dr. Dwain Sarna, who recommended f/u mammo and this was normal 12/2017--consider rpt 1 yr per rad.   Arthritis    DJD, back and both hips   Chronic low back pain without sciatica    Lumbar spondylosis + scoliosis.  Summer 2017 Dr. Ethelene Hal did ESI and pt got no relief.  Pt then saw Dr. Retia Passe [facility]: felt she had facet mediated pain; plan for B L345 MBB (??).   COVID 08/2022   Cystocele, midline    Diverticulosis    on colonoscopy 2004   GERD (gastroesophageal reflux disease)    Hx of cardiovascular stress test    Lexiscan Myoview (2/14):  Low risk, small ant defect likely breast attenuation, no ischemia, EF 69% (no change from 2010).     Hyperlipidemia    Hypertension    per pt   Idiopathic scoliosis of thoracolumbar region    Lumbar back pain    Mitral regurgitation    Echo (2/14):  EF 60-65%, Gr 1 DD, mild AI, mild bileaflet MVP, mod post directed MR, mild LAE, PASP 35.   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  Responding to gabapentin (Dr. Vickey Huger).   Osteoporosis, senile    stable left hip 2015 (took fosamax x 37yrs,  T-score -2.7 when she stopped bisphosphonate).  Rpt DEXA 47mo later (09/2016) T-score -.3.1 (different machine, though). 11/2019 DEXA T-score -4. Recommended restart of fosamax   PAF (paroxysmal atrial fibrillation) (HCC)    Dr. [name]: Flecainide, metopr, apxiaban.  08/2017-->persistent a-fib w/RVR, flecainide increased and she converted back to NSR and felt better.  100mg  bid flecainide continued.   Parkinson's disease (HCC) 2020 dx   started sinemet 05/2019 (Dr. Arbutus Leas)   Peripheral neuropathy 2008   Idiopathic vs familial (motor>sensory) vs lumbar disc dz: gabapentin helpful   Shoulder pain left   RC surg 09/2014   Past Surgical History:  Procedure Laterality Date   BREAST BIOPSY  04/10/2017   Left breast core needle biopsy of calcifications.  COMPLEX SCLEROSING LESION WITH CALCIFICATIONS --no sign of malignancy.  Excision recommended--pt has been referred to Dr. Dwain Sarna, who recommended rpt mammo and this was NORMAL 12/2017.   BREAST EXCISIONAL BIOPSY Right 2010   Benign   BREAST SURGERY  Nov 2010   Benign biopsy, right   CHOLECYSTECTOMY  03/2010   Dr.Gross   COLONOSCOPY  10/03/2002   No polyps.  Repeat 10 yrs recommended but pt declines.  Pt declined cologuard 08/2016 but changed her mind and this test was NEG on 03/23/17.   COMBINED HYSTERECTOMY VAGINAL /  OOPHORECTOMY / A&P REPAIR  1990   uncertain if ovaries were removed or not   CYSTOCELE REPAIR     DEXA  11/2019   T score -4   Lumpectomy  06/2009   "Fatty Necrosis"   PFT's  2014   NORMAL   REVERSE SHOULDER ARTHROPLASTY Right 03/11/2019   Procedure: REVERSE TOTAL SHOULDER ARTHROPLASTY;  Surgeon: Beverely Low, MD;  Location: [facility];  Service: Orthopedics;  Laterality: Right;  Intersclene block   ROTATOR CUFF REPAIR Left 10/02/14   Dr. Ranell Patrick   TONSILLECTOMY     TOTAL KNEE ARTHROPLASTY  09/2009   Right ;Dr Charlann Boxer   TOTAL KNEE ARTHROPLASTY Left 03/10/2016   Procedure: TOTAL KNEE ARTHROPLASTY;  Surgeon: Durene Romans, MD;  Location: [facility];  Service: Orthopedics;  Laterality: Left;   TRANSTHORACIC ECHOCARDIOGRAM  09/2012; 09/27/15   2014: EF 60-65%, Gr 1 DD, mild AI, mild bileaflet MVP, mod post directed MR, mild LAE, PASP 35.  Repeat 2017: EF 55-60%, mild AR, mild MVP and mild MV regurg.   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   Snoring 05/19/2017   S/P knee replacement 03/10/2016   Female bladder prolapse 12/06/2015   Paroxysmal atrial fibrillation with rapid ventricular response (HCC) 09/28/2015   Maxillary sinusitis, acute 08/27/2015   Mitral insufficiency 05/12/2014   HTN (hypertension) 04/28/2014   Lactic acidosis 09/07/2013   Mitral valve prolapse 06/14/2013   Chest pain, exertional 09/13/2012   Pernicious anemia 09/07/2012   DIVERTICULOSIS, COLON 09/06/2009   Osteoporosis 09/06/2009   Hyperlipidemia 09/04/2009   Peripheral sensory neuropathy 09/04/2009   LOW BACK PAIN, CHRONIC 04/08/2007   REFERRING PROVIDER: Lurena Joiner Tat DO  REFERRING DIAG: Parkinson  THERAPY DIAG:  Muscle weakness (generalized)  Unsteadiness on feet  History of falling  Rationale for Evaluation and Treatment: Rehabilitation  ONSET DATE: Ongoing.  Approximately 4 years ago.    SUBJECTIVE:   SUBJECTIVE STATEMENT: Pt reports "all over" pain today with the highest concentration in bil feet.   PERTINENT HISTORY: Please see above.   PAIN:  Are you having pain? Yes: NPRS scale: No number given. Pain location: "all over", greatest in B feet Pain description: Ache. Aggravating factors: Walking and  bending. Relieving factors: Sitting.  PRECAUTIONS: Fall  RED FLAGS: None   WEIGHT BEARING RESTRICTIONS: No  FALLS:  Has patient fallen in last 6 months? No  LIVING ENVIRONMENT: Lives with: lives with their family Lives in: House/apartment Stairs:  Avoids stairs.   Has following equipment at home: Dan Humphreys - 4 wheeled  OCCUPATION: Retired.    PLOF: Independent with household mobility with device  PATIENT GOALS: Get stronger and get around better.   OBJECTIVE:  Note: Objective measures were completed at Evaluation unless otherwise noted.   PATIENT SURVEYS:  TUG 32 seconds    POSTURE: rounded shoulders, forward head, decreased lumbar lordosis, increased thoracic kyphosis, flexed trunk , and right knee genu valgum.    LOWER EXTREMITY ROM:  WFL.  LOWER EXTREMITY MMT:  MMT Right eval Left eval  Hip flexion 4-/5 4/5  Hip extension  Hip abduction    Hip adduction    Hip internal rotation    Hip external rotation    Knee flexion 4+/5 4+/5  Knee extension 4+/5 4+/5  Ankle dorsiflexion 4/5 4/5  Ankle plantarflexion    Ankle inversion    Ankle eversion     (Blank rows = not tested)   FUNCTIONAL TESTS:  5 times sit to stand: 22 seconds Timed up and go (TUG): 32 seconds  GAIT: The patient is walking with a shuffled gait pattern using a 4-wheeled walker.  TODAY'S TREATMENT:  11/12/23                                  EXERCISE LOG  Exercise Repetitions and Resistance Comments  Nustep Lvl 2 x 15 mins   LAQs 2# 2 sets of 10   Seated Marches 2# 2 sets of 10   Seated Hip Abduction Red 3 mins   Seated Hip Adduction 3 mins   Seated Ham Curls Red 2 sets of 10   Rows Red 20 reps    Extensions Red 20 reps     Blank cell = exercise not performed today  PATIENT EDUCATION:  Education details:  Person educated:    International aid/development worker:    Education comprehension:     HOME EXERCISE PROGRAM:   ASSESSMENT:  CLINICAL IMPRESSION:  Pt arrives for today's  treatment session reporting "all over" pain, but reports that bil feet are hurting the most.  Pt able to tolerate introduction to Nustep for warm up today without discomfort, but does endorse fatigue.  Pt instructed in seated BLE and BUE exercises today to increase strength and function, while reducing pain.  Pt requiring min cues for proper technique and posture with all newly added exercises.  Pt reports "feeling good" at completion of today's treatment session.   OBJECTIVE IMPAIRMENTS: Abnormal gait, decreased activity tolerance, decreased mobility, difficulty walking, decreased strength, and pain.   ACTIVITY LIMITATIONS: carrying, lifting, bending, and locomotion level  PARTICIPATION LIMITATIONS: meal prep, cleaning, laundry, and community activity  PERSONAL FACTORS: Time since onset of injury/illness/exacerbation and 1-2 comorbidities: parkinson, bilateral TKA's  are also affecting patient's functional outcome.   REHAB POTENTIAL: Fair    CLINICAL DECISION MAKING: Evolving/moderate complexity  EVALUATION COMPLEXITY: Moderate   GOALS:  LONG TERM GOALS: Target date: 12/15/23  Ind with a HEP.  Goal status: INITIAL  2.  Patient improve 5 times sit to stand test by 5 seconds.  Goal status: INITIAL  3.  Patient improve TUG by 7 seconds.:  Goal status: INITIAL   PLAN:  PT FREQUENCY: 2x/week  PT DURATION: 6 weeks  PLANNED INTERVENTIONS: 97110-Therapeutic exercises, 97530- Therapeutic activity, [id number]- Neuromuscular re-education, 97535- Self Care, and 16109- Manual therapy  PLAN FOR NEXT SESSION: Nustep, LE strengthening.   Newman Pies, PTA 11/12/2023, 11:49 AM

## 2023-11-16 ENCOUNTER — Ambulatory Visit

## 2023-11-20 ENCOUNTER — Other Ambulatory Visit: Payer: Self-pay | Admitting: Neurology

## 2023-11-20 DIAGNOSIS — R351 Nocturia: Secondary | ICD-10-CM

## 2023-11-20 DIAGNOSIS — G4700 Insomnia, unspecified: Secondary | ICD-10-CM

## 2023-11-20 DIAGNOSIS — G2581 Restless legs syndrome: Secondary | ICD-10-CM

## 2023-11-20 DIAGNOSIS — G20A1 Parkinson's disease without dyskinesia, without mention of fluctuations: Secondary | ICD-10-CM

## 2023-11-23 ENCOUNTER — Ambulatory Visit

## 2023-11-23 DIAGNOSIS — G20A1 Parkinson's disease without dyskinesia, without mention of fluctuations: Secondary | ICD-10-CM | POA: Diagnosis not present

## 2023-11-23 DIAGNOSIS — Z9181 History of falling: Secondary | ICD-10-CM | POA: Diagnosis not present

## 2023-11-23 DIAGNOSIS — M6281 Muscle weakness (generalized): Secondary | ICD-10-CM | POA: Diagnosis not present

## 2023-11-23 DIAGNOSIS — R2681 Unsteadiness on feet: Secondary | ICD-10-CM | POA: Diagnosis not present

## 2023-11-23 NOTE — Therapy (Signed)
 OUTPATIENT PHYSICAL THERAPY LOWER EXTREMITY TREATMENT   Patient Name: Cassie White MRN: [id number] DOB:1941-07-22, 83 y.o., female Today's Date: 11/23/2023  END OF SESSION:  PT End of Session - 11/23/23 1305     Visit Number 3    Number of Visits 12    Date for PT Re-Evaluation 12/15/23    PT Start Time 1300    PT Stop Time 1345    PT Time Calculation (min) 45 min    Activity Tolerance Patient tolerated treatment well    Behavior During Therapy [facility] for tasks assessed/performed             Past Medical History:  Diagnosis Date   Abnormal mammogram of left breast    Likely benign microcalcifications--repeat L diag mammo 03/24/2017.  COMPLEX SCLEROSING LESION WITH CALCIFICATIONS --no sign of malignancy.  Excision recommended--pt has been referred to Dr. Delane Fear, who recommended f/u mammo and this was normal 12/2017--consider rpt 1 yr per rad.   Arthritis    DJD, back and both hips   Chronic low back pain without sciatica    Lumbar spondylosis + scoliosis.  Summer 2017 Dr. Rexanne Catalina did ESI and pt got no relief.  Pt then saw Dr. Allyn Arenas with [facility]: felt she had facet mediated pain; plan for B L345 MBB (??).   COVID 08/2022   Cystocele, midline    Diverticulosis    on colonoscopy 2004   GERD (gastroesophageal reflux disease)    Hx of cardiovascular stress test    Lexiscan  Myoview  (2/14):  Low risk, small ant defect likely breast attenuation, no ischemia, EF 69% (no change from 2010).     Hyperlipidemia    Hypertension    per pt   Idiopathic scoliosis of thoracolumbar region    Lumbar back pain    Mitral regurgitation    Echo (2/14):  EF 60-65%, Gr 1 DD, mild AI, mild bileaflet MVP, mod post directed MR, mild LAE, PASP 35.   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  Responding to gabapentin  (Dr. Albertina Hugger).   Osteoporosis, senile    stable left hip 2015 (took fosamax  x 1yrs,  T-score -2.7 when she stopped bisphosphonate).  Rpt DEXA 62mo later (09/2016) T-score -.3.1 (different machine, though). 11/2019 DEXA T-score -4. Recommended restart of fosamax    PAF (paroxysmal atrial fibrillation) (HCC)    Dr. [name]: Flecainide , metopr, apxiaban.  08/2017-->persistent a-fib w/RVR, flecainide  increased and she converted back to NSR and felt better.  100mg  bid flecainide  continued.   Parkinson's disease (HCC) 2020 dx   started sinemet  05/2019 (Dr. Winferd Hatter)   Peripheral neuropathy 2008   Idiopathic vs familial (motor>sensory) vs lumbar disc dz: gabapentin  helpful   Shoulder pain left   RC surg 09/2014   Past Surgical History:  Procedure Laterality Date   BREAST BIOPSY  04/10/2017   Left breast core needle biopsy of calcifications.  COMPLEX SCLEROSING LESION WITH CALCIFICATIONS --no sign of malignancy.  Excision recommended--pt has been referred to Dr. Delane Fear, who recommended rpt mammo and this was NORMAL 12/2017.   BREAST EXCISIONAL BIOPSY Right 2010   Benign   BREAST SURGERY  Nov 2010   Benign biopsy, right   CHOLECYSTECTOMY  03/2010   Dr.Gross   COLONOSCOPY  10/03/2002   No polyps.  Repeat 10 yrs recommended but pt declines.  Pt declined cologuard 08/2016 but changed her mind and this test was NEG on 03/23/17.   COMBINED HYSTERECTOMY VAGINAL /  OOPHORECTOMY / A&P REPAIR  1990   uncertain if ovaries were removed or not   CYSTOCELE REPAIR     DEXA  11/2019   T score -4   Lumpectomy  06/2009   "Fatty Necrosis"   PFT's  2014   NORMAL   REVERSE SHOULDER ARTHROPLASTY Right 03/11/2019   Procedure: REVERSE TOTAL SHOULDER ARTHROPLASTY;  Surgeon: Winston Hawking, MD;  Location: [facility];  Service: Orthopedics;  Laterality: Right;  Intersclene block   ROTATOR CUFF REPAIR Left 10/02/14   Dr. Brunilda Capra   TONSILLECTOMY     TOTAL KNEE ARTHROPLASTY  09/2009   Right ;Dr Bernard Brick   TOTAL KNEE ARTHROPLASTY Left 03/10/2016   Procedure: TOTAL KNEE ARTHROPLASTY;  Surgeon: Claiborne Crew, MD;  Location: [facility];  Service: Orthopedics;  Laterality: Left;   TRANSTHORACIC ECHOCARDIOGRAM  09/2012; 09/27/15   2014: EF 60-65%, Gr 1 DD, mild AI, mild bileaflet MVP, mod post directed MR, mild LAE, PASP 35.  Repeat 2017: EF 55-60%, mild AR, mild MVP and mild MV regurg.   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   Snoring 05/19/2017   S/P knee replacement 03/10/2016   Female bladder prolapse 12/06/2015   Paroxysmal atrial fibrillation with rapid ventricular response (HCC) 09/28/2015   Maxillary sinusitis, acute 08/27/2015   Mitral insufficiency 05/12/2014   HTN (hypertension) 04/28/2014   Lactic acidosis 09/07/2013   Mitral valve prolapse 06/14/2013   Chest pain, exertional 09/13/2012   Pernicious anemia 09/07/2012   DIVERTICULOSIS, COLON 09/06/2009   Osteoporosis 09/06/2009   Hyperlipidemia 09/04/2009   Peripheral sensory neuropathy 09/04/2009   LOW BACK PAIN, CHRONIC 04/08/2007   REFERRING PROVIDER: Ivette Marks Tat DO  REFERRING DIAG: Parkinson  THERAPY DIAG:  Muscle weakness (generalized)  Unsteadiness on feet  History of falling  Rationale for Evaluation and Treatment: Rehabilitation  ONSET DATE: Ongoing.  Approximately 4 years ago.    SUBJECTIVE:   SUBJECTIVE STATEMENT: Pt denies any pain today.    PERTINENT HISTORY: Please see above.   PAIN:  Are you having pain? No  PRECAUTIONS: Fall  RED FLAGS: None   WEIGHT BEARING RESTRICTIONS: No  FALLS:  Has patient fallen in last 6 months? No  LIVING ENVIRONMENT: Lives with: lives with their family Lives in:  House/apartment Stairs:  Avoids stairs.   Has following equipment at home: Otho Blitz - 4 wheeled  OCCUPATION: Retired.    PLOF: Independent with household mobility with device  PATIENT GOALS: Get stronger and get around better.   OBJECTIVE:  Note: Objective measures were completed at Evaluation unless otherwise noted.   PATIENT SURVEYS:  TUG 32 seconds  POSTURE: rounded shoulders, forward head, decreased lumbar lordosis, increased thoracic kyphosis, flexed trunk , and right knee genu valgum.  LOWER EXTREMITY ROM:  WFL.  LOWER EXTREMITY MMT:  MMT Right eval Left eval  Hip flexion 4-/5 4/5  Hip extension    Hip abduction    Hip adduction    Hip internal rotation    Hip external rotation    Knee flexion 4+/5 4+/5  Knee extension 4+/5 4+/5  Ankle dorsiflexion 4/5 4/5  Ankle plantarflexion    Ankle inversion    Ankle eversion     (Blank rows = not tested)   FUNCTIONAL TESTS:  5 times sit to stand: 22 seconds Timed up and go (TUG): 32 seconds  GAIT: The patient is walking with a shuffled gait pattern using a 4-wheeled walker.  TODAY'S TREATMENT:  11/23/23                                  EXERCISE LOG  Exercise Repetitions and Resistance Comments  Nustep Lvl 3 x 15 mins   LAQs 2# 2 sets of 15   Seated Marches 2# 2 sets of 15   Seated Hip Abduction Red 3.5 mins   Seated Hip Adduction 3.5 mins   Seated Ham Curls Red 2 sets of 15   Rows Red 25 reps    Extensions Red 25 reps     Blank cell = exercise not performed today   PATIENT EDUCATION:  Education details:  Person educated: Patient Education method: Explanation Education comprehension: verbalized understanding  HOME EXERCISE PROGRAM: [email or web address]  Access Code: [email or web address]  ASSESSMENT:  CLINICAL IMPRESSION:  Pt arrives for today's treatment session denying any pain.  Pt reporting minimal increase in left knee pain while performing Nustep, but able to complete exercise.  Pt able  to tolerate increased reps with all previously performed exercises.  Pt given print out of HEP and red t-band for home use.  Pt denied any pain at completion of today's treatment session.   OBJECTIVE IMPAIRMENTS: Abnormal gait, decreased activity tolerance, decreased mobility, difficulty walking, decreased strength, and pain.   ACTIVITY LIMITATIONS: carrying, lifting, bending, and locomotion level  PARTICIPATION LIMITATIONS: meal prep, cleaning, laundry, and community activity  PERSONAL FACTORS: Time since onset of injury/illness/exacerbation and 1-2 comorbidities: parkinson, bilateral TKA's  are also affecting patient's functional outcome.   REHAB POTENTIAL: Fair    CLINICAL DECISION MAKING: Evolving/moderate complexity  EVALUATION COMPLEXITY: Moderate   GOALS:  LONG TERM GOALS: Target date: 12/15/23  Ind with a HEP.  Goal status: INITIAL  2.  Patient improve 5 times sit to stand test by 5 seconds.  Goal status: INITIAL  3.  Patient improve TUG by 7 seconds.:  Goal status: INITIAL   PLAN:  PT FREQUENCY: 2x/week  PT DURATION: 6 weeks  PLANNED INTERVENTIONS: 97110-Therapeutic exercises, 97530- Therapeutic activity, [id number]- Neuromuscular re-education, 97535- Self Care, and 16109- Manual therapy  PLAN FOR NEXT SESSION: Nustep, LE strengthening.   Deryl Flora, PTA 11/23/2023, 2:17 PM

## 2023-11-30 ENCOUNTER — Ambulatory Visit

## 2023-11-30 DIAGNOSIS — R2681 Unsteadiness on feet: Secondary | ICD-10-CM

## 2023-11-30 DIAGNOSIS — M6281 Muscle weakness (generalized): Secondary | ICD-10-CM

## 2023-11-30 DIAGNOSIS — Z9181 History of falling: Secondary | ICD-10-CM

## 2023-11-30 DIAGNOSIS — G20A1 Parkinson's disease without dyskinesia, without mention of fluctuations: Secondary | ICD-10-CM | POA: Diagnosis not present

## 2023-11-30 NOTE — Therapy (Signed)
 OUTPATIENT PHYSICAL THERAPY LOWER EXTREMITY TREATMENT   Patient Name: Cassie White MRN: [id number] DOB:September 24, 1940, 83 y.o., female Today's Date: 11/30/2023  END OF SESSION:  PT End of Session - 11/30/23 1306     Visit Number 4    Number of Visits 12    Date for PT Re-Evaluation 12/15/23    PT Start Time 1300    PT Stop Time 1350    PT Time Calculation (min) 50 min    Activity Tolerance Patient tolerated treatment well    Behavior During Therapy [facility] for tasks assessed/performed             Past Medical History:  Diagnosis Date   Abnormal mammogram of left breast    Likely benign microcalcifications--repeat L diag mammo 03/24/2017.  COMPLEX SCLEROSING LESION WITH CALCIFICATIONS --no sign of malignancy.  Excision recommended--pt has been referred to Dr. Delane Fear, who recommended f/u mammo and this was normal 12/2017--consider rpt 1 yr per rad.   Arthritis    DJD, back and both hips   Chronic low back pain without sciatica    Lumbar spondylosis + scoliosis.  Summer 2017 Dr. Rexanne Catalina did ESI and pt got no relief.  Pt then saw Dr. Allyn Arenas [facility]: felt she had facet mediated pain; plan for B L345 MBB (??).   COVID 08/2022   Cystocele, midline    Diverticulosis    on colonoscopy 2004   GERD (gastroesophageal reflux disease)    Hx of cardiovascular stress test    Lexiscan  Myoview  (2/14):  Low risk, small ant defect likely breast attenuation, no ischemia, EF 69% (no change from 2010).     Hyperlipidemia    Hypertension    per pt   Idiopathic scoliosis of thoracolumbar region    Lumbar back pain    Mitral regurgitation    Echo (2/14):  EF 60-65%, Gr 1 DD, mild AI, mild bileaflet MVP, mod post directed MR, mild LAE, PASP 35.   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  Responding to gabapentin  (Dr. Albertina Hugger).   Osteoporosis, senile    stable left hip 2015 (took fosamax  x 15yrs,  T-score -2.7 when she stopped bisphosphonate).  Rpt DEXA 65mo later (09/2016) T-score -.3.1 (different machine, though). 11/2019 DEXA T-score -4. Recommended restart of fosamax    PAF (paroxysmal atrial fibrillation) (HCC)    Dr. [name]: Flecainide , metopr, apxiaban.  08/2017-->persistent a-fib w/RVR, flecainide  increased and she converted back to NSR and felt better.  100mg  bid flecainide  continued.   Parkinson's disease (HCC) 2020 dx   started sinemet  05/2019 (Dr. Winferd Hatter)   Peripheral neuropathy 2008   Idiopathic vs familial (motor>sensory) vs lumbar disc dz: gabapentin  helpful   Shoulder pain left   RC surg 09/2014   Past Surgical History:  Procedure Laterality Date   BREAST BIOPSY  04/10/2017   Left breast core needle biopsy of calcifications.  COMPLEX SCLEROSING LESION WITH CALCIFICATIONS --no sign of malignancy.  Excision recommended--pt has been referred to Dr. Delane Fear, who recommended rpt mammo and this was NORMAL 12/2017.   BREAST EXCISIONAL BIOPSY Right 2010   Benign   BREAST SURGERY  Nov 2010   Benign biopsy, right   CHOLECYSTECTOMY  03/2010   Dr.Gross   COLONOSCOPY  10/03/2002   No polyps.  Repeat 10 yrs recommended but pt declines.  Pt declined cologuard 08/2016 but changed her mind and this test was NEG on 03/23/17.   COMBINED HYSTERECTOMY VAGINAL /  OOPHORECTOMY / A&P REPAIR  1990   uncertain if ovaries were removed or not   CYSTOCELE REPAIR     DEXA  11/2019   T score -4   Lumpectomy  06/2009   "Fatty Necrosis"   PFT's  2014   NORMAL   REVERSE SHOULDER ARTHROPLASTY Right 03/11/2019   Procedure: REVERSE TOTAL SHOULDER ARTHROPLASTY;  Surgeon: Winston Hawking, MD;  Location: [facility];  Service: Orthopedics;  Laterality: Right;  Intersclene block   ROTATOR CUFF REPAIR Left 10/02/14   Dr. Brunilda Capra   TONSILLECTOMY     TOTAL KNEE ARTHROPLASTY  09/2009   Right ;Dr Bernard Brick   TOTAL KNEE ARTHROPLASTY Left 03/10/2016   Procedure: TOTAL KNEE ARTHROPLASTY;  Surgeon: Claiborne Crew, MD;  Location: [facility];  Service: Orthopedics;  Laterality: Left;   TRANSTHORACIC ECHOCARDIOGRAM  09/2012; 09/27/15   2014: EF 60-65%, Gr 1 DD, mild AI, mild bileaflet MVP, mod post directed MR, mild LAE, PASP 35.  Repeat 2017: EF 55-60%, mild AR, mild MVP and mild MV regurg.   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   Snoring 05/19/2017   S/P knee replacement 03/10/2016   Female bladder prolapse 12/06/2015   Paroxysmal atrial fibrillation with rapid ventricular response (HCC) 09/28/2015   Maxillary sinusitis, acute 08/27/2015   Mitral insufficiency 05/12/2014   HTN (hypertension) 04/28/2014   Lactic acidosis 09/07/2013   Mitral valve prolapse 06/14/2013   Chest pain, exertional 09/13/2012   Pernicious anemia 09/07/2012   DIVERTICULOSIS, COLON 09/06/2009   Osteoporosis 09/06/2009   Hyperlipidemia 09/04/2009   Peripheral sensory neuropathy 09/04/2009   LOW BACK PAIN, CHRONIC 04/08/2007   REFERRING PROVIDER: Ivette Marks Tat DO  REFERRING DIAG: Parkinson  THERAPY DIAG:  Muscle weakness (generalized)  Unsteadiness on feet  History of falling  Rationale for Evaluation and Treatment: Rehabilitation  ONSET DATE: Ongoing.  Approximately 4 years ago.    SUBJECTIVE:   SUBJECTIVE STATEMENT: Pt denies any pain upon arriving for today's treatment session.  Pt states that back pain got up to 8/10 this morning when she got up, but extra strength tylenol  relieved her pain.   PERTINENT HISTORY: Please see above.   PAIN:  Are you having pain? No  PRECAUTIONS: Fall  RED FLAGS: None   WEIGHT  BEARING RESTRICTIONS: No  FALLS:  Has patient fallen in last 6 months? No  LIVING ENVIRONMENT: Lives with: lives with their family Lives in: House/apartment Stairs:  Avoids stairs.   Has following equipment at home: Otho Blitz - 4 wheeled  OCCUPATION: Retired.    PLOF: Independent with household mobility with device  PATIENT GOALS: Get stronger and get around better.   OBJECTIVE:  Note: Objective measures were completed at Evaluation unless otherwise noted.   PATIENT SURVEYS:  TUG 32 seconds  POSTURE: rounded shoulders, forward head, decreased lumbar lordosis, increased thoracic kyphosis, flexed trunk , and right knee genu valgum.  LOWER EXTREMITY ROM:  WFL.  LOWER EXTREMITY MMT:  MMT Right eval Left eval  Hip flexion 4-/5 4/5  Hip extension    Hip abduction  Hip adduction    Hip internal rotation    Hip external rotation    Knee flexion 4+/5 4+/5  Knee extension 4+/5 4+/5  Ankle dorsiflexion 4/5 4/5  Ankle plantarflexion    Ankle inversion    Ankle eversion     (Blank rows = not tested)   FUNCTIONAL TESTS:  5 times sit to stand: 22 seconds Timed up and go (TUG): 32 seconds  GAIT: The patient is walking with a shuffled gait pattern using a 4-wheeled walker.  TODAY'S TREATMENT:  11/30/23                                EXERCISE LOG  Exercise Repetitions and Resistance Comments  Nustep Lvl 3 x 15 mins   LAQs 3# 2 sets of 10   Seated Marches 3# 2 sets of 10   Seated Hip Abduction Green 3 mins   Seated Hip Adduction 3.5 mins   Seated Ham Curls Green 2 sets of 15   Rows Red 25 reps    Extensions Red 25 reps     Blank cell = exercise not performed today   PATIENT EDUCATION:  Education details:  Person educated: Patient Education method: Explanation Education comprehension: verbalized understanding  HOME EXERCISE PROGRAM: [email or web address]  Access Code: [email or web address]  ASSESSMENT:  CLINICAL IMPRESSION:  Pt arrives for today's  treatment session denying any pain.  Pt reports she had 8/10 low back pain this morning upon getting up, but was able to decrease pain by taking extra strength ibuprofen.  Pt able to tolerate increased reps or time with all previously performed exercises.  Pt denies any pain at completion of today's treatment session.     OBJECTIVE IMPAIRMENTS: Abnormal gait, decreased activity tolerance, decreased mobility, difficulty walking, decreased strength, and pain.   ACTIVITY LIMITATIONS: carrying, lifting, bending, and locomotion level  PARTICIPATION LIMITATIONS: meal prep, cleaning, laundry, and community activity  PERSONAL FACTORS: Time since onset of injury/illness/exacerbation and 1-2 comorbidities: parkinson, bilateral TKA's  are also affecting patient's functional outcome.   REHAB POTENTIAL: Fair    CLINICAL DECISION MAKING: Evolving/moderate complexity  EVALUATION COMPLEXITY: Moderate   GOALS:  LONG TERM GOALS: Target date: 12/15/23  Ind with a HEP.  Goal status: INITIAL  2.  Patient improve 5 times sit to stand test by 5 seconds.  Goal status: INITIAL  3.  Patient improve TUG by 7 seconds.:  Goal status: INITIAL   PLAN:  PT FREQUENCY: 2x/week  PT DURATION: 6 weeks  PLANNED INTERVENTIONS: 97110-Therapeutic exercises, 97530- Therapeutic activity, [id number]- Neuromuscular re-education, 97535- Self Care, and 21308- Manual therapy  PLAN FOR NEXT SESSION: Nustep, LE strengthening.   Deryl Flora, PTA 11/30/2023, 2:44 PM

## 2023-12-03 ENCOUNTER — Encounter

## 2023-12-07 ENCOUNTER — Ambulatory Visit: Admitting: Family Medicine

## 2023-12-07 ENCOUNTER — Ambulatory Visit: Attending: Neurology

## 2023-12-07 DIAGNOSIS — R2681 Unsteadiness on feet: Secondary | ICD-10-CM | POA: Diagnosis not present

## 2023-12-07 DIAGNOSIS — M6281 Muscle weakness (generalized): Secondary | ICD-10-CM | POA: Insufficient documentation

## 2023-12-07 DIAGNOSIS — Z9181 History of falling: Secondary | ICD-10-CM | POA: Diagnosis not present

## 2023-12-07 NOTE — Therapy (Signed)
 OUTPATIENT PHYSICAL THERAPY LOWER EXTREMITY TREATMENT   Patient Name: Cassie White MRN: [id number] DOB:03/02/1941, 83 y.o., female Today's Date: 12/07/2023  END OF SESSION:  PT End of Session - 12/07/23 1312     Visit Number 5    Number of Visits 12    Date for PT Re-Evaluation 12/15/23    PT Start Time 1307    PT Stop Time 1401    PT Time Calculation (min) 54 min    Activity Tolerance Patient tolerated treatment well    Behavior During Therapy [facility] for tasks assessed/performed             Past Medical History:  Diagnosis Date   Abnormal mammogram of left breast    Likely benign microcalcifications--repeat L diag mammo 03/24/2017.  COMPLEX SCLEROSING LESION WITH CALCIFICATIONS --no sign of malignancy.  Excision recommended--pt has been referred to Dr. Delane Fear, who recommended f/u mammo and this was normal 12/2017--consider rpt 1 yr per rad.   Arthritis    DJD, back and both hips   Chronic low back pain without sciatica    Lumbar spondylosis + scoliosis.  Summer 2017 Dr. Rexanne Catalina did ESI and pt got no relief.  Pt then saw Dr. Allyn Arenas with [facility]: felt she had facet mediated pain; plan for B L345 MBB (??).   COVID 08/2022   Cystocele, midline    Diverticulosis    on colonoscopy 2004   GERD (gastroesophageal reflux disease)    Hx of cardiovascular stress test    Lexiscan  Myoview  (2/14):  Low risk, small ant defect likely breast attenuation, no ischemia, EF 69% (no change from 2010).     Hyperlipidemia    Hypertension    per pt   Idiopathic scoliosis of thoracolumbar region    Lumbar back pain    Mitral regurgitation    Echo (2/14):  EF 60-65%, Gr 1 DD, mild AI, mild bileaflet MVP, mod post directed MR, mild LAE, PASP 35.   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  Responding to gabapentin  (Dr. Albertina Hugger).   Osteoporosis, senile    stable left hip 2015 (took fosamax  x 51yrs, T-score  -2.7 when she stopped bisphosphonate).  Rpt DEXA 42mo later (09/2016) T-score -.3.1 (different machine, though). 11/2019 DEXA T-score -4. Recommended restart of fosamax    PAF (paroxysmal atrial fibrillation) (HCC)    Dr. [name]: Flecainide , metopr, apxiaban.  08/2017-->persistent a-fib w/RVR, flecainide  increased and she converted back to NSR and felt better.  100mg  bid flecainide  continued.   Parkinson's disease (HCC) 2020 dx   started sinemet  05/2019 (Dr. Winferd Hatter)   Peripheral neuropathy 2008   Idiopathic vs familial (motor>sensory) vs lumbar disc dz: gabapentin  helpful   Shoulder pain left   RC surg 09/2014   Past Surgical History:  Procedure Laterality Date   BREAST BIOPSY  04/10/2017   Left breast core needle biopsy of calcifications.  COMPLEX SCLEROSING LESION WITH CALCIFICATIONS --no sign of malignancy.  Excision recommended--pt has been referred to Dr. Delane Fear, who recommended rpt mammo and this was NORMAL 12/2017.   BREAST EXCISIONAL BIOPSY Right 2010   Benign   BREAST SURGERY  Nov 2010   Benign biopsy, right   CHOLECYSTECTOMY  03/2010   Dr.Gross   COLONOSCOPY  10/03/2002   No polyps.  Repeat 10 yrs recommended but pt declines.  Pt declined cologuard 08/2016 but changed her mind and this test was NEG on 03/23/17.   COMBINED HYSTERECTOMY VAGINAL /  OOPHORECTOMY / A&P REPAIR  1990   uncertain if ovaries were removed or not   CYSTOCELE REPAIR     DEXA  11/2019   T score -4   Lumpectomy  06/2009   "Fatty Necrosis"   PFT's  2014   NORMAL   REVERSE SHOULDER ARTHROPLASTY Right 03/11/2019   Procedure: REVERSE TOTAL SHOULDER ARTHROPLASTY;  Surgeon: Winston Hawking, MD;  Location: [facility];  Service: Orthopedics;  Laterality: Right;  Intersclene block   ROTATOR CUFF REPAIR Left 10/02/14   Dr. Brunilda Capra   TONSILLECTOMY     TOTAL KNEE ARTHROPLASTY  09/2009   Right ;Dr Bernard Brick   TOTAL KNEE ARTHROPLASTY Left 03/10/2016   Procedure: TOTAL KNEE ARTHROPLASTY;  Surgeon: Claiborne Crew, MD;  Location: [facility];   Service: Orthopedics;  Laterality: Left;   TRANSTHORACIC ECHOCARDIOGRAM  09/2012; 09/27/15   2014: EF 60-65%, Gr 1 DD, mild AI, mild bileaflet MVP, mod post directed MR, mild LAE, PASP 35.  Repeat 2017: EF 55-60%, mild AR, mild MVP and mild MV regurg.   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   Snoring 05/19/2017   S/P knee replacement 03/10/2016   Female bladder prolapse 12/06/2015   Paroxysmal atrial fibrillation with rapid ventricular response (HCC) 09/28/2015   Maxillary sinusitis, acute 08/27/2015   Mitral insufficiency 05/12/2014   HTN (hypertension) 04/28/2014   Lactic acidosis 09/07/2013   Mitral valve prolapse 06/14/2013   Chest pain, exertional 09/13/2012   Pernicious anemia 09/07/2012   DIVERTICULOSIS, COLON 09/06/2009   Osteoporosis 09/06/2009   Hyperlipidemia 09/04/2009   Peripheral sensory neuropathy 09/04/2009   LOW BACK PAIN, CHRONIC 04/08/2007   REFERRING PROVIDER: Ivette Marks Tat DO  REFERRING DIAG: Parkinson  THERAPY DIAG:  Muscle weakness (generalized)  Unsteadiness on feet  History of falling  Rationale for Evaluation and Treatment: Rehabilitation  ONSET DATE: Ongoing.  Approximately 4 years ago.    SUBJECTIVE:   SUBJECTIVE STATEMENT: Pt denies any pain today.   PERTINENT HISTORY: Please see above.   PAIN:  Are you having pain? No  PRECAUTIONS: Fall  RED FLAGS: None   WEIGHT BEARING RESTRICTIONS: No  FALLS:  Has patient fallen in last 6 months? No  LIVING ENVIRONMENT: Lives with: lives with their family Lives in:  House/apartment Stairs:  Avoids stairs.   Has following equipment at home: Otho Blitz - 4 wheeled  OCCUPATION: Retired.    PLOF: Independent with household mobility with device  PATIENT GOALS: Get stronger and get around better.   OBJECTIVE:  Note: Objective measures were completed at Evaluation unless otherwise noted.   PATIENT SURVEYS:  TUG 32 seconds  POSTURE: rounded shoulders, forward head, decreased lumbar lordosis, increased thoracic kyphosis, flexed trunk , and right knee genu valgum.  LOWER EXTREMITY ROM:  WFL.  LOWER EXTREMITY MMT:  MMT Right eval Left eval  Hip flexion 4-/5 4/5  Hip extension    Hip abduction    Hip adduction    Hip internal rotation    Hip external rotation    Knee flexion 4+/5 4+/5  Knee extension 4+/5 4+/5  Ankle  dorsiflexion 4/5 4/5  Ankle plantarflexion    Ankle inversion    Ankle eversion     (Blank rows = not tested)   FUNCTIONAL TESTS:  5 times sit to stand: 22 seconds Timed up and go (TUG): 32 seconds  GAIT: The patient is walking with a shuffled gait pattern using a 4-wheeled walker.  TODAY'S TREATMENT:  12/07/23                                EXERCISE LOG  Exercise Repetitions and Resistance Comments  Nustep Lvl 3 x 20 mins   5 STS 13.72 seconds   LAQs 3# 2 sets of 15   Seated Marches 3# 2 sets of 15   Seated Hip Abduction Green 3.5 mins   Seated Hip Adduction 3.5 mins   Seated Ham Curls Green 2 sets of 15   Rows    Extensions     Blank cell = exercise not performed today   PATIENT EDUCATION:  Education details:  Person educated: Patient Education method: Explanation Education comprehension: verbalized understanding  HOME EXERCISE PROGRAM: [email or web address]  Access Code: [email or web address]  ASSESSMENT:  CLINICAL IMPRESSION:  Pt arrives for today's treatment session denying any pain.  Pt able to perform 5 STS in 13.72 seconds meeting her goal for STS tranfers at this time.  Pt able to decrease TUG  by 5 seconds today, making good progress, towards her goal at this time. Pt able to tolerate increased reps or time with several exercises performed today.  Pt educated on the importance of having her cell phone with her at all times in order to be able to contact someone in the instance she was to fall again.  Pt reports that her family is in the progress of getting her Life Alert at this time. Pt also states that she makes someone aware of the fact of when she is getting into and out of the shower.  Pt denied any pain at completion of today's treatment session.   OBJECTIVE IMPAIRMENTS: Abnormal gait, decreased activity tolerance, decreased mobility, difficulty walking, decreased strength, and pain.   ACTIVITY LIMITATIONS: carrying, lifting, bending, and locomotion level  PARTICIPATION LIMITATIONS: meal prep, cleaning, laundry, and community activity  PERSONAL FACTORS: Time since onset of injury/illness/exacerbation and 1-2 comorbidities: parkinson, bilateral TKA's  are also affecting patient's functional outcome.   REHAB POTENTIAL: Fair    CLINICAL DECISION MAKING: Evolving/moderate complexity  EVALUATION COMPLEXITY: Moderate   GOALS:  LONG TERM GOALS: Target date: 12/15/23  Ind with a HEP. 5/5: "Not much"  Goal status: IN PROGRESS  2.  Patient improve 5 times sit to stand test by 5 seconds.   5/5: 13.72 Goal status: MET  3.  Patient improve TUG by 7 seconds.:   5/5: 27 seconds Goal status: IN PROGRESS   PLAN:  PT FREQUENCY: 2x/week  PT DURATION: 6 weeks  PLANNED INTERVENTIONS: 97110-Therapeutic exercises, 97530- Therapeutic activity, [id number]- Neuromuscular re-education, 97535- Self Care, and 16109- Manual therapy  PLAN FOR NEXT SESSION: Nustep, LE strengthening.   Deryl Flora, PTA 12/07/2023, 2:12 PM

## 2023-12-10 ENCOUNTER — Encounter

## 2023-12-12 NOTE — Progress Notes (Unsigned)
 Cardiology Office Note   Date:  12/16/2023   ID:  Cassie White, DOB 1941/02/18, MRN [id number]  PCP:  Shelvia Dick, MD  Cardiologist:  Jakaleb Payer [name], MD EP: None  Chief Complaint  Patient presents with   Atrial Fibrillation      History of Present Illness: Cassie White is a 83 y.o. female with PMH of paroxysmal atrial fibrillation, MVP with mitral regurgitation, HLD, and GERD, who presents for follow up.  She has a history of Afib managed with  flecainide . She is on Eliquis  for anticoagulation.  Her last ischemic evaluation was a NST in 2017 to rule out pro arrhythmia and the ischemic heart disease, there was moderate sized and intensity partially reversible anterior and apical defect, EF was 64%, likely representing shifting breast artifact, overall it was considered intermediate risk study. I personally reviewed the images and felt this was low risk. She has no anginal symptoms.  Her last echocardiogram in 2017 showed EF 55-60%, mild AI, mild MR, with peak PA pressures 36 mmHg.  She is followed by Neurology for Parkinson's disease.   She has been doing well from a cardiac standpoint. States sometimes her HR is slow when lying down at night but doesn't have a HR monitor. No sustained tachycardia. Notes she is a little weaker. No dyspnea or chest pain.     Past Medical History:  Diagnosis Date   Abnormal mammogram of left breast    Likely benign microcalcifications--repeat L diag mammo 03/24/2017.  COMPLEX SCLEROSING LESION WITH CALCIFICATIONS --no sign of malignancy.  Excision recommended--pt has been referred to Dr. Delane Fear, who recommended f/u mammo and this was normal 12/2017--consider rpt 1 yr per rad.   Arthritis    DJD, back and both hips   Chronic low back pain without sciatica    Lumbar spondylosis + scoliosis.  Summer 2017 Dr. Rexanne Catalina did ESI and pt got no relief.  Pt then saw Dr. Allyn Arenas [facility]: felt she had facet mediated pain; plan for B L345  MBB (??).   COVID 08/2022   Cystocele, midline    Diverticulosis    on colonoscopy 2004   GERD (gastroesophageal reflux disease)    Hx of cardiovascular stress test    Lexiscan  Myoview  (2/14):  Low risk, small ant defect likely breast attenuation, no ischemia, EF 69% (no change from 2010).     Hyperlipidemia    Hypertension    per pt   Idiopathic scoliosis of thoracolumbar region    Lumbar back pain    Mitral regurgitation    Echo (2/14):  EF 60-65%, Gr 1 DD, mild AI, mild bileaflet MVP, mod post directed MR, mild LAE, PASP 35.   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  Responding to gabapentin  (Dr. Albertina Hugger).   Osteoporosis, senile    stable left hip 2015 (took fosamax  x 11yrs, T-score -2.7 when she stopped bisphosphonate).  Rpt DEXA 36mo later (09/2016) T-score -.3.1 (different machine, though). 11/2019 DEXA T-score -4. Recommended restart of fosamax    PAF (paroxysmal atrial fibrillation) (HCC)    Dr. [name]: Flecainide , metopr, apxiaban.  08/2017-->persistent a-fib w/RVR, flecainide  increased and she converted back to NSR and felt better.  100mg  bid flecainide  continued.   Parkinson's disease (HCC) 2020 dx   started sinemet  05/2019 (Dr. Winferd Hatter)   Peripheral neuropathy 2008   Idiopathic vs familial (motor>sensory) vs lumbar disc dz: gabapentin  helpful   Shoulder pain  left   RC surg 09/2014    Past Surgical History:  Procedure Laterality Date   BREAST BIOPSY  04/10/2017   Left breast core needle biopsy of calcifications.  COMPLEX SCLEROSING LESION WITH CALCIFICATIONS --no sign of malignancy.  Excision recommended--pt has been referred to Dr. Delane Fear, who recommended rpt mammo and this was NORMAL 12/2017.   BREAST EXCISIONAL BIOPSY Right 2010   Benign   BREAST SURGERY  Nov 2010   Benign biopsy, right   CHOLECYSTECTOMY  03/2010   Dr.Gross   COLONOSCOPY  10/03/2002   No polyps.  Repeat 10 yrs  recommended but pt declines.  Pt declined cologuard 08/2016 but changed her mind and this test was NEG on 03/23/17.   COMBINED HYSTERECTOMY VAGINAL / OOPHORECTOMY / A&P REPAIR  1990   uncertain if ovaries were removed or not   CYSTOCELE REPAIR     DEXA  11/2019   T score -4   Lumpectomy  06/2009   "Fatty Necrosis"   PFT's  2014   NORMAL   REVERSE SHOULDER ARTHROPLASTY Right 03/11/2019   Procedure: REVERSE TOTAL SHOULDER ARTHROPLASTY;  Surgeon: Winston Hawking, MD;  Location: [facility];  Service: Orthopedics;  Laterality: Right;  Intersclene block   ROTATOR CUFF REPAIR Left 10/02/14   Dr. Brunilda Capra   TONSILLECTOMY     TOTAL KNEE ARTHROPLASTY  09/2009   Right ;Dr Bernard Brick   TOTAL KNEE ARTHROPLASTY Left 03/10/2016   Procedure: TOTAL KNEE ARTHROPLASTY;  Surgeon: Claiborne Crew, MD;  Location: [facility];  Service: Orthopedics;  Laterality: Left;   TRANSTHORACIC ECHOCARDIOGRAM  09/2012; 09/27/15   2014: EF 60-65%, Gr 1 DD, mild AI, mild bileaflet MVP, mod post directed MR, mild LAE, PASP 35.  Repeat 2017: EF 55-60%, mild AR, mild MVP and mild MV regurg.   VAGINAL HYSTERECTOMY     For Uterine Deviation      Current Outpatient Medications  Medication Sig Dispense Refill   betamethasone dipropionate 0.05 % lotion      Biotin  5000 MCG CAPS Take 5,000 mcg by mouth daily.     Calcium  Carbonate (CALCIUM  600 PO) Take 600 mg by mouth 2 (two) times daily.      carbidopa -levodopa  (SINEMET  CR) 50-200 MG tablet TAKE 1 TABLET BY MOUTH EVERYDAY AT BEDTIME 90 tablet 1   carbidopa -levodopa  (SINEMET  IR) 25-100 MG tablet Take  2 at 8am, Take 2 at 11 am,  Take 2 at 2 pm and 1 at 5pm 630 tablet 0   cyanocobalamin  (VITAMIN B12) 1000 MCG/ML injection INJECT 1 ML (1,000 MCG) INTRAMUSCULARLY EVERY 30 DAYS 3 mL 6   ELIQUIS  5 MG TABS tablet TAKE 1 TABLET BY MOUTH TWICE A DAY 180 tablet 3   flecainide  (TAMBOCOR ) 100 MG tablet Take 1 tablet (100 mg total) by mouth 2 (two) times daily. Please keep upcoming appointment with Dr [name] for any  further refills. 180 tablet 3   fluticasone  (FLONASE ) 50 MCG/ACT nasal spray Place 2 sprays into both nostrils daily. 48 g 3   gabapentin  (NEURONTIN ) 300 MG capsule TAKE 2 CAPSULES BY MOUTH TWICE A DAY 360 capsule 1   lidocaine  (LIDODERM ) 5 % Place 1 patch onto the skin daily. Remove & Discard patch within 12 hours or as directed by MD 30 patch 0   methylPREDNISolone  (MEDROL  DOSEPAK) 4 MG TBPK tablet Follow package insert 21 each 0   metoprolol  tartrate (LOPRESSOR ) 25 MG tablet Take 1 tablet (25 mg total) by mouth 2 (two) times daily. 180 tablet 3   nitroGLYCERIN  (  NITROSTAT ) 0.4 MG SL tablet PLACE 1 TABLET UNDER THE TONGUE EVERY 5 MINUTES AS NEEDED FOR CHEST PAIN. 25 tablet 1   Omega-3 Fatty Acids (FISH OIL ) 1200 MG CAPS Take 1,000 mg by mouth 2 (two) times daily.      pravastatin  (PRAVACHOL ) 40 MG tablet Take 1 tablet (40 mg total) by mouth daily. 90 tablet 3   raloxifene  (EVISTA ) 60 MG tablet Take 1 tablet (60 mg total) by mouth daily. 90 tablet 1   traZODone  (DESYREL ) 100 MG tablet TAKE 1 TABLET BY MOUTH EVERYDAY AT BEDTIME 90 tablet 0   No current facility-administered medications for this visit.    Allergies:   Sulfa antibiotics and Tizanidine     Social History:  The patient  reports that she has never smoked. She has never used smokeless tobacco. She reports that she does not drink alcohol and does not use drugs.   Family History:  The patient's family history includes COPD in her brother; Coronary artery disease in her brother; Healthy in her daughter and son; Heart attack in her father; Heart attack (age of onset: 76) in her son; Ovarian cancer in her mother; Stroke (age of onset: 100) in her father.    ROS:  Please see the history of present illness.   Otherwise, review of systems are positive for none.   All other systems are reviewed and negative.    PHYSICAL EXAM: VS:  BP (!) 142/68 (Cuff Size: Normal)   Pulse (!) 59   Ht 5\' 1"  (1.549 m)   Wt 138 lb (62.6 kg)   SpO2 95%    BMI 26.07 kg/m  , BMI Body mass index is 26.07 kg/m. GEN: Well nourished, well developed, in no acute distress HEENT: normal Neck: no JVD, carotid bruits, or masses Cardiac: bradycardic, regular rhythm, no murmurs, rubs, or gallops, no edema  Respiratory:  clear to auscultation bilaterally, normal work of breathing GI: soft, nontender, nondistended, + BS MS: no deformity or atrophy Skin: warm and dry, no rash Neuro:  Strength and sensation are intact Psych: euthymic mood, full affect   EKG Interpretation Date/Time:  Wednesday Dec 16 2023 15:17:38 EDT Ventricular Rate:  58 PR Interval:  298 QRS Duration:  124 QT Interval:  480 QTC Calculation: 471 R Axis:   -5  Text Interpretation: Sinus bradycardia with 1st degree A-V block Non-specific intra-ventricular conduction delay When compared with ECG of Dec 04, 2022 No significant change was found Confirmed by [name], Rochester Serpe [phone]) on 12/16/2023 3:26:42 PM     Recent Labs: 09/18/2023: ALT 5; BUN 23; Creatinine, Ser 0.85; Hemoglobin 13.7; Platelets 150.0; Potassium 4.9; Sodium 139; TSH 1.05    Lipid Panel    Component Value Date/Time   CHOL 154 09/18/2023 1343   CHOL 211 (H) 04/28/2014 1026   TRIG 99.0 09/18/2023 1343   TRIG 95 04/28/2014 1026   HDL 71.80 09/18/2023 1343   HDL 101 04/28/2014 1026   CHOLHDL 2 09/18/2023 1343   VLDL 19.8 09/18/2023 1343   LDLCALC 62 09/18/2023 1343   LDLCALC 91 04/28/2014 1026   LDLDIRECT 87.5 09/07/2012 1022      Wt Readings from Last 3 Encounters:  12/16/23 138 lb (62.6 kg)  10/25/23 138 lb (62.6 kg)  10/23/23 143 lb (64.9 kg)      Other studies Reviewed: Additional studies/ records that were reviewed today include:     Echo 09/27/2015 LV EF: 55% -   60%   Study Conclusions   - Left ventricle: The cavity  size was normal. There was mild   concentric hypertrophy. Systolic function was normal. The   estimated ejection fraction was in the range of 55% to 60%. - Aortic valve: There  was mild regurgitation. - Mitral valve: Mild anterior leafelt prolapse. There was mild   regurgitation. - Atrial septum: No defect or patent foramen ovale was identified. - Pulmonary arteries: PA peak pressure: 36 mm Hg (S).     Myoview  10/18/2015 Study Highlights    The left ventricular ejection fraction is moderately decreased (30-44%). Nuclear stress EF: 64%. There was no ST segment deviation noted during stress. No T wave inversion was noted during stress. Defect 1: There is a medium defect of moderate severity. This is an intermediate risk study. Findings consistent with prior myocardial infarction with peri-infarct ischemia.   Moderate size and intensity partially reversible anterior and apical wall defect (SDS 4). LVEF 64% with normal wall motion. This likely represents shifting breast artifact given normal wall motion and LV function. Clinical correlation is advised. This is an intermediate risk study.       ASSESSMENT AND PLAN:  1.  Paroxysmal atrial fibrillation: well controlled on Flecainide  and metoprolol . No complaints of bleeding on eliquis  - Continue flecainide  for rhythm control - Continue metoprolol  for rate control - Continue eliquis  for stroke ppx - labs in Feb were ok.   2. HLD: LDL 62 - Continue pravastatin  and omega 3  3. MVP with mitral regurgitation: mild MR on echo,  - exam is stable.  - Continue to monitor for now   Current medicines are reviewed at length with the patient today.  The patient does not have concerns regarding medicines.  The following changes have been made:  As above  Labs/ tests ordered today include:   Orders Placed This Encounter  Procedures   EKG 12-Lead     Disposition:   FU with Dr. [name] in one year  Signed, Ewel Lona [name], MD  12/16/2023 3:32 PM

## 2023-12-14 ENCOUNTER — Encounter

## 2023-12-15 ENCOUNTER — Other Ambulatory Visit: Payer: Self-pay | Admitting: Cardiology

## 2023-12-15 NOTE — Telephone Encounter (Signed)
 Prescription refill request for Eliquis  received. Indication:afib Last office visit:upcoming Scr:0.85  2/25 Age: 83 Weight:62.6  kg  Prescription refilled

## 2023-12-16 ENCOUNTER — Encounter: Payer: Self-pay | Admitting: Cardiology

## 2023-12-16 ENCOUNTER — Ambulatory Visit: Payer: Medicare Other | Attending: Cardiology | Admitting: Cardiology

## 2023-12-16 VITALS — BP 142/68 | HR 59 | Ht 61.0 in | Wt 138.0 lb

## 2023-12-16 DIAGNOSIS — I480 Paroxysmal atrial fibrillation: Secondary | ICD-10-CM

## 2023-12-16 DIAGNOSIS — I341 Nonrheumatic mitral (valve) prolapse: Secondary | ICD-10-CM | POA: Diagnosis not present

## 2023-12-16 DIAGNOSIS — E785 Hyperlipidemia, unspecified: Secondary | ICD-10-CM

## 2023-12-16 NOTE — Patient Instructions (Signed)
 Medication Instructions:  Continue same medications *If you need a refill on your cardiac medications before your next appointment, please call your pharmacy*  Lab Work: None ordered  Testing/Procedures: None ordered  Follow-Up: At [facility], you and your health needs are our priority.  As part of our continuing mission to provide you with exceptional heart care, our providers are all part of one team.  This team includes your primary Cardiologist (physician) and Advanced Practice Providers or APPs (Physician Assistants and Nurse Practitioners) who all work together to provide you with the care you need, when you need it.  Your next appointment:  1 year    Call in Feb to schedule May appointment     Provider:  Dr.Jordan   We recommend signing up for the patient portal called "MyChart".  Sign up information is provided on this After Visit Summary.  MyChart is used to connect with patients for Virtual Visits (Telemedicine).  Patients are able to view lab/test results, encounter notes, upcoming appointments, etc.  Non-urgent messages can be sent to your provider as well.   To learn more about what you can do with MyChart, go to [email or web address].

## 2023-12-19 ENCOUNTER — Other Ambulatory Visit: Payer: Self-pay | Admitting: Cardiology

## 2023-12-21 ENCOUNTER — Ambulatory Visit

## 2023-12-21 DIAGNOSIS — M6281 Muscle weakness (generalized): Secondary | ICD-10-CM

## 2023-12-21 DIAGNOSIS — Z9181 History of falling: Secondary | ICD-10-CM

## 2023-12-21 DIAGNOSIS — R2681 Unsteadiness on feet: Secondary | ICD-10-CM

## 2023-12-21 NOTE — Therapy (Signed)
 OUTPATIENT PHYSICAL THERAPY LOWER EXTREMITY TREATMENT   Patient Name: Cassie White MRN: [id number] DOB:11-Jan-1941, 83 y.o., female Today's Date: 12/21/2023  END OF SESSION:  PT End of Session - 12/21/23 1349     Visit Number 6    Number of Visits 12    Date for PT Re-Evaluation 12/15/23    PT Start Time 1345    PT Stop Time 1430    PT Time Calculation (min) 45 min    Activity Tolerance Patient tolerated treatment well    Behavior During Therapy [facility] for tasks assessed/performed             Past Medical History:  Diagnosis Date   Abnormal mammogram of left breast    Likely benign microcalcifications--repeat L diag mammo 03/24/2017.  COMPLEX SCLEROSING LESION WITH CALCIFICATIONS --no sign of malignancy.  Excision recommended--pt has been referred to Dr. Delane Fear, who recommended f/u mammo and this was normal 12/2017--consider rpt 1 yr per rad.   Arthritis    DJD, back and both hips   Chronic low back pain without sciatica    Lumbar spondylosis + scoliosis.  Summer 2017 Dr. Rexanne Catalina did ESI and pt got no relief.  Pt then saw Dr. Allyn Arenas with [facility]: felt she had facet mediated pain; plan for B L345 MBB (??).   COVID 08/2022   Cystocele, midline    Diverticulosis    on colonoscopy 2004   GERD (gastroesophageal reflux disease)    Hx of cardiovascular stress test    Lexiscan  Myoview  (2/14):  Low risk, small ant defect likely breast attenuation, no ischemia, EF 69% (no change from 2010).     Hyperlipidemia    Hypertension    per pt   Idiopathic scoliosis of thoracolumbar region    Lumbar back pain    Mitral regurgitation    Echo (2/14):  EF 60-65%, Gr 1 DD, mild AI, mild bileaflet MVP, mod post directed MR, mild LAE, PASP 35.   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  Responding to gabapentin  (Dr. Albertina Hugger).   Osteoporosis, senile    stable left hip 2015 (took fosamax  x 52yrs,  T-score -2.7 when she stopped bisphosphonate).  Rpt DEXA 58mo later (09/2016) T-score -.3.1 (different machine, though). 11/2019 DEXA T-score -4. Recommended restart of fosamax    PAF (paroxysmal atrial fibrillation) (HCC)    Dr. [name]: Flecainide , metopr, apxiaban.  08/2017-->persistent a-fib w/RVR, flecainide  increased and she converted back to NSR and felt better.  100mg  bid flecainide  continued.   Parkinson's disease (HCC) 2020 dx   started sinemet  05/2019 (Dr. Winferd Hatter)   Peripheral neuropathy 2008   Idiopathic vs familial (motor>sensory) vs lumbar disc dz: gabapentin  helpful   Shoulder pain left   RC surg 09/2014   Past Surgical History:  Procedure Laterality Date   BREAST BIOPSY  04/10/2017   Left breast core needle biopsy of calcifications.  COMPLEX SCLEROSING LESION WITH CALCIFICATIONS --no sign of malignancy.  Excision recommended--pt has been referred to Dr. Delane Fear, who recommended rpt mammo and this was NORMAL 12/2017.   BREAST EXCISIONAL BIOPSY Right 2010   Benign   BREAST SURGERY  Nov 2010   Benign biopsy, right   CHOLECYSTECTOMY  03/2010   Dr.Gross   COLONOSCOPY  10/03/2002   No polyps.  Repeat 10 yrs recommended but pt declines.  Pt declined cologuard 08/2016 but changed her mind and this test was NEG on 03/23/17.   COMBINED HYSTERECTOMY VAGINAL /  OOPHORECTOMY / A&P REPAIR  1990   uncertain if ovaries were removed or not   CYSTOCELE REPAIR     DEXA  11/2019   T score -4   Lumpectomy  06/2009   "Fatty Necrosis"   PFT's  2014   NORMAL   REVERSE SHOULDER ARTHROPLASTY Right 03/11/2019   Procedure: REVERSE TOTAL SHOULDER ARTHROPLASTY;  Surgeon: Winston Hawking, MD;  Location: [facility];  Service: Orthopedics;  Laterality: Right;  Intersclene block   ROTATOR CUFF REPAIR Left 10/02/14   Dr. Brunilda Capra   TONSILLECTOMY     TOTAL KNEE ARTHROPLASTY  09/2009   Right ;Dr Bernard Brick   TOTAL KNEE ARTHROPLASTY Left 03/10/2016   Procedure: TOTAL KNEE ARTHROPLASTY;  Surgeon: Claiborne Crew, MD;  Location: [facility];  Service: Orthopedics;  Laterality: Left;   TRANSTHORACIC ECHOCARDIOGRAM  09/2012; 09/27/15   2014: EF 60-65%, Gr 1 DD, mild AI, mild bileaflet MVP, mod post directed MR, mild LAE, PASP 35.  Repeat 2017: EF 55-60%, mild AR, mild MVP and mild MV regurg.   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   Snoring 05/19/2017   S/P knee replacement 03/10/2016   Female bladder prolapse 12/06/2015   Paroxysmal atrial fibrillation with rapid ventricular response (HCC) 09/28/2015   Maxillary sinusitis, acute 08/27/2015   Mitral insufficiency 05/12/2014   HTN (hypertension) 04/28/2014   Lactic acidosis 09/07/2013   Mitral valve prolapse 06/14/2013   Chest pain, exertional 09/13/2012   Pernicious anemia 09/07/2012   DIVERTICULOSIS, COLON 09/06/2009   Osteoporosis 09/06/2009   Hyperlipidemia 09/04/2009   Peripheral sensory neuropathy 09/04/2009   LOW BACK PAIN, CHRONIC 04/08/2007   REFERRING PROVIDER: Ivette Marks Tat DO  REFERRING DIAG: Parkinson  THERAPY DIAG:  Muscle weakness (generalized)  Unsteadiness on feet  History of falling  Rationale for Evaluation and Treatment: Rehabilitation  ONSET DATE: Ongoing.  Approximately 4 years ago.    SUBJECTIVE:   SUBJECTIVE STATEMENT: Pt denies any pain today, but reports that she feels very weak.  Pt states that she has not been performing her HEP.   PERTINENT HISTORY: Please see above.   PAIN:  Are you having pain? No  PRECAUTIONS: Fall  RED FLAGS: None   WEIGHT BEARING RESTRICTIONS: No  FALLS:  Has patient fallen in last  6 months? No  LIVING ENVIRONMENT: Lives with: lives with their family Lives in: House/apartment Stairs: Avoids stairs.   Has following equipment at home: Otho Blitz - 4 wheeled  OCCUPATION: Retired.    PLOF: Independent with household mobility with device  PATIENT GOALS: Get stronger and get around better.   OBJECTIVE:  Note: Objective measures were completed at Evaluation unless otherwise noted.   PATIENT SURVEYS:  TUG 32 seconds  POSTURE: rounded shoulders, forward head, decreased lumbar lordosis, increased thoracic kyphosis, flexed trunk , and right knee genu valgum.  LOWER EXTREMITY ROM:  WFL.  LOWER EXTREMITY MMT:  MMT Right eval Left eval  Hip flexion 4-/5 4/5  Hip extension    Hip abduction    Hip adduction    Hip internal rotation  Hip external rotation    Knee flexion 4+/5 4+/5  Knee extension 4+/5 4+/5  Ankle dorsiflexion 4/5 4/5  Ankle plantarflexion    Ankle inversion    Ankle eversion     (Blank rows = not tested)   FUNCTIONAL TESTS:  5 times sit to stand: 22 seconds Timed up and go (TUG): 32 seconds  GAIT: The patient is walking with a shuffled gait pattern using a 4-wheeled walker.  TODAY'S TREATMENT:  12/21/23                                EXERCISE LOG  Exercise Repetitions and Resistance Comments  Nustep Lvl 3 x 20 mins   5 STS    LAQs 4# x 20 reps bil   Seated Marches 4# x 20 reps bil   Seated Hip Abduction 3 mins   Seated Hip Adduction Blue x 3 mins   Seated Ham Curls Blue x 20 reps bil   Rows Red x 25 reps bil   Extensions Red x 25 reps bil    Blank cell = exercise not performed today   PATIENT EDUCATION:  Education details:  Person educated: Patient Education method: Explanation Education comprehension: verbalized understanding  HOME EXERCISE PROGRAM: [email or web address]  Access Code: [email or web address]  ASSESSMENT:  CLINICAL IMPRESSION: Pt arrives for today's treatment session denying any pain, but reports  feeling very weak and tired today.  Pt states that she is not performing her exercises at home and says this is why she has gotten weaker.  Pt able to tolerate increased resistance with all seated exercises today without pain, but does report feeling a little tired after each exercise.  Pt denies any pain at completion of today's treatment session, but does report feeling as if she "had a good work out."  OBJECTIVE IMPAIRMENTS: Abnormal gait, decreased activity tolerance, decreased mobility, difficulty walking, decreased strength, and pain.   ACTIVITY LIMITATIONS: carrying, lifting, bending, and locomotion level  PARTICIPATION LIMITATIONS: meal prep, cleaning, laundry, and community activity  PERSONAL FACTORS: Time since onset of injury/illness/exacerbation and 1-2 comorbidities: parkinson, bilateral TKA's are also affecting patient's functional outcome.   REHAB POTENTIAL: Fair    CLINICAL DECISION MAKING: Evolving/moderate complexity  EVALUATION COMPLEXITY: Moderate   GOALS:  LONG TERM GOALS: Target date: 12/15/23  Ind with a HEP. 5/5: "Not much"  Goal status: IN PROGRESS  2.  Patient improve 5 times sit to stand test by 5 seconds.   5/5: 13.72 Goal status: MET  3.  Patient improve TUG by 7 seconds.:   5/5: 27 seconds Goal status: IN PROGRESS   PLAN:  PT FREQUENCY: 2x/week  PT DURATION: 6 weeks  PLANNED INTERVENTIONS: 97110-Therapeutic exercises, 97530- Therapeutic activity, [id number]- Neuromuscular re-education, 97535- Self Care, and [id number]- Manual therapy  PLAN FOR NEXT SESSION: Nustep, LE strengthening.   Deryl Flora, PTA 12/21/2023, 2:36 PM

## 2023-12-24 ENCOUNTER — Other Ambulatory Visit: Payer: Self-pay | Admitting: Cardiology

## 2023-12-24 ENCOUNTER — Other Ambulatory Visit: Payer: Self-pay | Admitting: Family Medicine

## 2023-12-29 ENCOUNTER — Encounter

## 2023-12-29 ENCOUNTER — Encounter: Payer: Self-pay | Admitting: Neurology

## 2024-01-05 ENCOUNTER — Ambulatory Visit: Attending: Neurology

## 2024-01-05 DIAGNOSIS — R2681 Unsteadiness on feet: Secondary | ICD-10-CM | POA: Diagnosis not present

## 2024-01-05 DIAGNOSIS — Z9181 History of falling: Secondary | ICD-10-CM

## 2024-01-05 DIAGNOSIS — M6281 Muscle weakness (generalized): Secondary | ICD-10-CM

## 2024-01-05 NOTE — Therapy (Signed)
 OUTPATIENT PHYSICAL THERAPY LOWER EXTREMITY TREATMENT   Patient Name: Cassie White MRN: [id number] DOB:02/16/1941, 83 y.o., female Today's Date: 01/05/2024  END OF SESSION:  PT End of Session - 01/05/24 1523     Visit Number 7    Number of Visits 12    Date for PT Re-Evaluation 12/15/23    PT Start Time 1515    PT Stop Time 1600    PT Time Calculation (min) 45 min    Activity Tolerance Patient tolerated treatment well    Behavior During Therapy [facility] for tasks assessed/performed             Past Medical History:  Diagnosis Date   Abnormal mammogram of left breast    Likely benign microcalcifications--repeat L diag mammo 03/24/2017.  COMPLEX SCLEROSING LESION WITH CALCIFICATIONS --no sign of malignancy.  Excision recommended--pt has been referred to Dr. Delane Fear, who recommended f/u mammo and this was normal 12/2017--consider rpt 1 yr per rad.   Arthritis    DJD, back and both hips   Chronic low back pain without sciatica    Lumbar spondylosis + scoliosis.  Summer 2017 Dr. Rexanne Catalina did ESI and pt got no relief.  Pt then saw Dr. Allyn Arenas [facility]: felt she had facet mediated pain; plan for B L345 MBB (??).   COVID 08/2022   Cystocele, midline    Diverticulosis    on colonoscopy 2004   GERD (gastroesophageal reflux disease)    Hx of cardiovascular stress test    Lexiscan  Myoview  (2/14):  Low risk, small ant defect likely breast attenuation, no ischemia, EF 69% (no change from 2010).     Hyperlipidemia    Hypertension    per pt   Idiopathic scoliosis of thoracolumbar region    Lumbar back pain    Mitral regurgitation    Echo (2/14):  EF 60-65%, Gr 1 DD, mild AI, mild bileaflet MVP, mod post directed MR, mild LAE, PASP 35.   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  Responding to gabapentin  (Dr. Albertina Hugger).   Osteoporosis, senile    stable left hip 2015 (took fosamax  x 21yrs, T-score  -2.7 when she stopped bisphosphonate).  Rpt DEXA 85mo later (09/2016) T-score -.3.1 (different machine, though). 11/2019 DEXA T-score -4. Recommended restart of fosamax    PAF (paroxysmal atrial fibrillation) (HCC)    Dr. [name]: Flecainide , metopr, apxiaban.  08/2017-->persistent a-fib w/RVR, flecainide  increased and she converted back to NSR and felt better.  100mg  bid flecainide  continued.   Parkinson's disease (HCC) 2020 dx   started sinemet  05/2019 (Dr. Winferd Hatter)   Peripheral neuropathy 2008   Idiopathic vs familial (motor>sensory) vs lumbar disc dz: gabapentin  helpful   Shoulder pain left   RC surg 09/2014   Past Surgical History:  Procedure Laterality Date   BREAST BIOPSY  04/10/2017   Left breast core needle biopsy of calcifications.  COMPLEX SCLEROSING LESION WITH CALCIFICATIONS --no sign of malignancy.  Excision recommended--pt has been referred to Dr. Delane Fear, who recommended rpt mammo and this was NORMAL 12/2017.   BREAST EXCISIONAL BIOPSY Right 2010   Benign   BREAST SURGERY  Nov 2010   Benign biopsy, right   CHOLECYSTECTOMY  03/2010   Dr.Gross   COLONOSCOPY  10/03/2002   No polyps.  Repeat 10 yrs recommended but pt declines.  Pt declined cologuard 08/2016 but changed her mind and this test was NEG on 03/23/17.   COMBINED HYSTERECTOMY VAGINAL /  OOPHORECTOMY / A&P REPAIR  1990   uncertain if ovaries were removed or not   CYSTOCELE REPAIR     DEXA  11/2019   T score -4   Lumpectomy  06/2009   "Fatty Necrosis"   PFT's  2014   NORMAL   REVERSE SHOULDER ARTHROPLASTY Right 03/11/2019   Procedure: REVERSE TOTAL SHOULDER ARTHROPLASTY;  Surgeon: Winston Hawking, MD;  Location: [facility];  Service: Orthopedics;  Laterality: Right;  Intersclene block   ROTATOR CUFF REPAIR Left 10/02/14   Dr. Brunilda Capra   TONSILLECTOMY     TOTAL KNEE ARTHROPLASTY  09/2009   Right ;Dr Bernard Brick   TOTAL KNEE ARTHROPLASTY Left 03/10/2016   Procedure: TOTAL KNEE ARTHROPLASTY;  Surgeon: Claiborne Crew, MD;  Location: [facility];   Service: Orthopedics;  Laterality: Left;   TRANSTHORACIC ECHOCARDIOGRAM  09/2012; 09/27/15   2014: EF 60-65%, Gr 1 DD, mild AI, mild bileaflet MVP, mod post directed MR, mild LAE, PASP 35.  Repeat 2017: EF 55-60%, mild AR, mild MVP and mild MV regurg.   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   Snoring 05/19/2017   S/P knee replacement 03/10/2016   Female bladder prolapse 12/06/2015   Paroxysmal atrial fibrillation with rapid ventricular response (HCC) 09/28/2015   Maxillary sinusitis, acute 08/27/2015   Mitral insufficiency 05/12/2014   HTN (hypertension) 04/28/2014   Lactic acidosis 09/07/2013   Mitral valve prolapse 06/14/2013   Chest pain, exertional 09/13/2012   Pernicious anemia 09/07/2012   DIVERTICULOSIS, COLON 09/06/2009   Osteoporosis 09/06/2009   Hyperlipidemia 09/04/2009   Peripheral sensory neuropathy 09/04/2009   LOW BACK PAIN, CHRONIC 04/08/2007   REFERRING PROVIDER: Ivette Marks Tat DO  REFERRING DIAG: Parkinson  THERAPY DIAG:  Muscle weakness (generalized)  History of falling  Unsteadiness on feet  Rationale for Evaluation and Treatment: Rehabilitation  ONSET DATE: Ongoing.  Approximately 4 years ago.    SUBJECTIVE:   SUBJECTIVE STATEMENT: Pt denies any pain today, but reports that she feels very weak.  Pt states that she has not been performing her HEP.   PERTINENT HISTORY: Please see above.   PAIN:  Are you having pain? No  PRECAUTIONS: Fall  RED FLAGS: None   WEIGHT BEARING RESTRICTIONS: No  FALLS:  Has patient fallen in last 6  months? No  LIVING ENVIRONMENT: Lives with: lives with their family Lives in: House/apartment Stairs: Avoids stairs.   Has following equipment at home: Otho Blitz - 4 wheeled  OCCUPATION: Retired.    PLOF: Independent with household mobility with device  PATIENT GOALS: Get stronger and get around better.   OBJECTIVE:  Note: Objective measures were completed at Evaluation unless otherwise noted.   PATIENT SURVEYS:  TUG 32 seconds  POSTURE: rounded shoulders, forward head, decreased lumbar lordosis, increased thoracic kyphosis, flexed trunk , and right knee genu valgum.  LOWER EXTREMITY ROM:  WFL.  LOWER EXTREMITY MMT:  MMT Right eval Left eval  Hip flexion 4-/5 4/5  Hip extension    Hip abduction    Hip adduction    Hip internal rotation  Hip external rotation    Knee flexion 4+/5 4+/5  Knee extension 4+/5 4+/5  Ankle dorsiflexion 4/5 4/5  Ankle plantarflexion    Ankle inversion    Ankle eversion     (Blank rows = not tested)   FUNCTIONAL TESTS:  5 times sit to stand: 22 seconds Timed up and go (TUG): 32 seconds  GAIT: The patient is walking with a shuffled gait pattern using a 4-wheeled walker.  TODAY'S TREATMENT:  01/05/24                                EXERCISE LOG  Exercise Repetitions and Resistance Comments  Nustep Lvl 3 x 20 mins   TUG 22.3 seconds   LAQs 4# x 25 reps bil   Seated Marches 4# x 25 reps bil   Seated Hip Abduction 3 mins   Seated Hip Adduction Blue x 3 mins   Seated Ham Curls Blue x 25 reps bil   Rows Red x 25 reps bil   Extensions Red x 25 reps bil    Blank cell = exercise not performed today   PATIENT EDUCATION:  Education details:  Person educated: Patient Education method: Explanation Education comprehension: verbalized understanding  HOME EXERCISE PROGRAM: [email or web address]  Access Code: [email or web address]  ASSESSMENT:  CLINICAL IMPRESSION: Pt arrives for today's treatment session denying any pain.  Pt  able to perform TUG in 22.3 seconds, decreasing her TUG time by approximately 10 seconds, meeting her LTG.  Pt reports that she has not been performing HEP regularly.  Heavy emphasis giving to the importance of performing exercises regularly to keep her strength and endurance increased.  Pt and daughter verbalize understanding.  Pt encouraged to call the facility with any questions or concerns.  Pt denied any pain at completion of today's treatment session.  Pt ready for discharge at this time.   OBJECTIVE IMPAIRMENTS: Abnormal gait, decreased activity tolerance, decreased mobility, difficulty walking, decreased strength, and pain.   ACTIVITY LIMITATIONS: carrying, lifting, bending, and locomotion level  PARTICIPATION LIMITATIONS: meal prep, cleaning, laundry, and community activity  PERSONAL FACTORS: Time since onset of injury/illness/exacerbation and 1-2 comorbidities: parkinson, bilateral TKA's are also affecting patient's functional outcome.   REHAB POTENTIAL: Fair    CLINICAL DECISION MAKING: Evolving/moderate complexity  EVALUATION COMPLEXITY: Moderate   GOALS:  LONG TERM GOALS: Target date: 12/15/23  Ind with a HEP. 5/5: "Not much"  Goal status: IN PROGRESS  2.  Patient improve 5 times sit to stand test by 5 seconds.   5/5: 13.72 Goal status: MET  3.  Patient improve TUG by 7 seconds.:   5/5: 27 seconds; 6/3: 22.3 seconds Goal status: MET   PLAN:  PT FREQUENCY: 2x/week  PT DURATION: 6 weeks  PLANNED INTERVENTIONS: 97110-Therapeutic exercises, 97530- Therapeutic activity, [id number]- Neuromuscular re-education, 97535- Self Care, and 16109- Manual therapy  PLAN FOR NEXT SESSION: Nustep, LE strengthening.   Deryl Flora, PTA 01/05/2024, 4:06 PM   PHYSICAL THERAPY DISCHARGE SUMMARY  Visits from Start of Care: 7.  Current functional level related to goals / functional outcomes: See above.   Remaining deficits: LTG's #2 and 3 met.   Education / Equipment: HEP.    Patient agrees to discharge. Patient goals were partially met. Patient is being discharged due to being pleased with the current functional level.    Chad Applegate MPT

## 2024-01-15 ENCOUNTER — Ambulatory Visit
Admission: RE | Admit: 2024-01-15 | Discharge: 2024-01-15 | Disposition: A | Discharge status: Home / Self Care | Payer: Medicare Other | Source: Ambulatory Visit | Attending: Family Medicine | Admitting: Family Medicine

## 2024-01-15 ENCOUNTER — Telehealth: Payer: Self-pay

## 2024-01-15 DIAGNOSIS — Z0000 Encounter for general adult medical examination without abnormal findings: Secondary | ICD-10-CM

## 2024-01-15 DIAGNOSIS — Z1231 Encounter for screening mammogram for malignant neoplasm of breast: Secondary | ICD-10-CM | POA: Diagnosis not present

## 2024-01-15 NOTE — Telephone Encounter (Signed)
 Spoke with pt's daughter Abe Abed regarding form completion, she is still waiting on a call back from [place]. She is unsure if Odilia Bennett will be reaching back out to her today or not but she will inform us  as she receives updates. Office fax number was provided to send any forms needed

## 2024-01-15 NOTE — Telephone Encounter (Signed)
 Reason for CRM: Patient daughter would like a form(RD2) completed to clear her mother to go to an nursing home/assistance living facility([facility]). Patient daughter would give us  a call back to provide facility fax number.    Do we have forms here or would patient have to call assisted living facility? Thank you,  Janett Kamath

## 2024-01-18 ENCOUNTER — Other Ambulatory Visit: Payer: Self-pay

## 2024-01-18 NOTE — Telephone Encounter (Signed)
 Source  Frankowski, Shalimar A (Patient)   Subject  Bunker, Jazelyn A (Patient)   Topic  Medical Record Request - Records Request     Communication  Reason for CRM: Patients daughter asking for her husband Cindy Creed to pick up the paperwork needed for the nursing home, please contact Minnette Amato                 Spoke with pt's daughter Abe Abed and she will be contacting facility to have them call our office to confirm what forms are needed for patient.

## 2024-01-19 ENCOUNTER — Encounter: Payer: Self-pay | Admitting: Family Medicine

## 2024-01-19 NOTE — Telephone Encounter (Signed)
 Called daughter to inform her that forms were ready for pick up

## 2024-01-25 ENCOUNTER — Encounter (HOSPITAL_BASED_OUTPATIENT_CLINIC_OR_DEPARTMENT_OTHER): Payer: Self-pay

## 2024-01-25 ENCOUNTER — Emergency Department (HOSPITAL_BASED_OUTPATIENT_CLINIC_OR_DEPARTMENT_OTHER)
Admission: EM | Admit: 2024-01-25 | Discharge: 2024-01-25 | Disposition: A | Discharge status: Home / Self Care | Source: Home / Self Care | Attending: Emergency Medicine | Admitting: Emergency Medicine

## 2024-01-25 ENCOUNTER — Other Ambulatory Visit: Payer: Self-pay

## 2024-01-25 ENCOUNTER — Emergency Department (HOSPITAL_BASED_OUTPATIENT_CLINIC_OR_DEPARTMENT_OTHER)

## 2024-01-25 ENCOUNTER — Ambulatory Visit: Payer: Self-pay

## 2024-01-25 DIAGNOSIS — Z7901 Long term (current) use of anticoagulants: Secondary | ICD-10-CM | POA: Diagnosis not present

## 2024-01-25 DIAGNOSIS — I2510 Atherosclerotic heart disease of native coronary artery without angina pectoris: Secondary | ICD-10-CM | POA: Diagnosis not present

## 2024-01-25 DIAGNOSIS — R918 Other nonspecific abnormal finding of lung field: Secondary | ICD-10-CM | POA: Diagnosis not present

## 2024-01-25 DIAGNOSIS — Z1152 Encounter for screening for COVID-19: Secondary | ICD-10-CM | POA: Diagnosis not present

## 2024-01-25 DIAGNOSIS — Z8616 Personal history of COVID-19: Secondary | ICD-10-CM | POA: Diagnosis not present

## 2024-01-25 DIAGNOSIS — J069 Acute upper respiratory infection, unspecified: Secondary | ICD-10-CM | POA: Diagnosis not present

## 2024-01-25 DIAGNOSIS — I700 Atherosclerosis of aorta: Secondary | ICD-10-CM | POA: Diagnosis not present

## 2024-01-25 DIAGNOSIS — Z888 Allergy status to other drugs, medicaments and biological substances status: Secondary | ICD-10-CM | POA: Diagnosis not present

## 2024-01-25 DIAGNOSIS — E785 Hyperlipidemia, unspecified: Secondary | ICD-10-CM | POA: Diagnosis not present

## 2024-01-25 DIAGNOSIS — Z882 Allergy status to sulfonamides status: Secondary | ICD-10-CM | POA: Diagnosis not present

## 2024-01-25 DIAGNOSIS — J8410 Pulmonary fibrosis, unspecified: Secondary | ICD-10-CM | POA: Diagnosis not present

## 2024-01-25 DIAGNOSIS — I10 Essential (primary) hypertension: Secondary | ICD-10-CM | POA: Insufficient documentation

## 2024-01-25 DIAGNOSIS — N3000 Acute cystitis without hematuria: Secondary | ICD-10-CM | POA: Diagnosis not present

## 2024-01-25 DIAGNOSIS — B9789 Other viral agents as the cause of diseases classified elsewhere: Secondary | ICD-10-CM | POA: Diagnosis not present

## 2024-01-25 DIAGNOSIS — I6782 Cerebral ischemia: Secondary | ICD-10-CM | POA: Diagnosis not present

## 2024-01-25 DIAGNOSIS — K219 Gastro-esophageal reflux disease without esophagitis: Secondary | ICD-10-CM | POA: Diagnosis not present

## 2024-01-25 DIAGNOSIS — Z8249 Family history of ischemic heart disease and other diseases of the circulatory system: Secondary | ICD-10-CM | POA: Diagnosis not present

## 2024-01-25 DIAGNOSIS — G20A1 Parkinson's disease without dyskinesia, without mention of fluctuations: Secondary | ICD-10-CM | POA: Diagnosis not present

## 2024-01-25 DIAGNOSIS — E872 Acidosis, unspecified: Secondary | ICD-10-CM | POA: Diagnosis not present

## 2024-01-25 DIAGNOSIS — J9811 Atelectasis: Secondary | ICD-10-CM | POA: Diagnosis not present

## 2024-01-25 DIAGNOSIS — Z79899 Other long term (current) drug therapy: Secondary | ICD-10-CM | POA: Insufficient documentation

## 2024-01-25 DIAGNOSIS — D72819 Decreased white blood cell count, unspecified: Secondary | ICD-10-CM | POA: Diagnosis not present

## 2024-01-25 DIAGNOSIS — R6520 Severe sepsis without septic shock: Secondary | ICD-10-CM | POA: Diagnosis not present

## 2024-01-25 DIAGNOSIS — A419 Sepsis, unspecified organism: Secondary | ICD-10-CM | POA: Diagnosis not present

## 2024-01-25 DIAGNOSIS — I081 Rheumatic disorders of both mitral and tricuspid valves: Secondary | ICD-10-CM | POA: Diagnosis not present

## 2024-01-25 DIAGNOSIS — I2489 Other forms of acute ischemic heart disease: Secondary | ICD-10-CM | POA: Diagnosis not present

## 2024-01-25 DIAGNOSIS — R051 Acute cough: Secondary | ICD-10-CM | POA: Insufficient documentation

## 2024-01-25 DIAGNOSIS — R0602 Shortness of breath: Secondary | ICD-10-CM | POA: Diagnosis not present

## 2024-01-25 DIAGNOSIS — D693 Immune thrombocytopenic purpura: Secondary | ICD-10-CM | POA: Diagnosis not present

## 2024-01-25 DIAGNOSIS — I4819 Other persistent atrial fibrillation: Secondary | ICD-10-CM | POA: Diagnosis not present

## 2024-01-25 DIAGNOSIS — J690 Pneumonitis due to inhalation of food and vomit: Secondary | ICD-10-CM | POA: Diagnosis not present

## 2024-01-25 DIAGNOSIS — J9601 Acute respiratory failure with hypoxia: Secondary | ICD-10-CM | POA: Diagnosis not present

## 2024-01-25 DIAGNOSIS — Z96611 Presence of right artificial shoulder joint: Secondary | ICD-10-CM | POA: Diagnosis not present

## 2024-01-25 DIAGNOSIS — G9341 Metabolic encephalopathy: Secondary | ICD-10-CM | POA: Diagnosis not present

## 2024-01-25 LAB — RESP PANEL BY RT-PCR (RSV, FLU A&B, COVID)  RVPGX2
Influenza A by PCR: NEGATIVE
Influenza B by PCR: NEGATIVE
Resp Syncytial Virus by PCR: NEGATIVE
SARS Coronavirus 2 by RT PCR: NEGATIVE

## 2024-01-25 MED ORDER — BENZONATATE 100 MG PO CAPS: 100.0000 mg | ORAL_CAPSULE | Freq: Two times a day (BID) | ORAL | 0 refills | Status: DC | PRN

## 2024-01-25 NOTE — Telephone Encounter (Signed)
 noted

## 2024-01-25 NOTE — Telephone Encounter (Signed)
 FYI Only or Action Required?: FYI only for provider.  Patient was last seen in primary care on 09/18/2023 by [name], [name], MD. Called Nurse Triage reporting URI. Symptoms began several days ago. Interventions attempted: OTC medications: Mucinex and Flonase . Symptoms are: gradually worsening.  Triage Disposition: See HCP Within 4 Hours (Or PCP Triage)  Patient/caregiver understands and will follow disposition?: Yes  Spoke with Arland, daughter. States pt has gotten worse. Has a lot of chest congestion and wanting to have CXR done. No appts with PCP today, Arland will take pt to [facility] for eval so she can have care done all at one place d/t pt being weak.  Copied from CRM [id number]. Topic: Clinical - Red Word Triage >> Jan 25, 2024  9:16 AM Suzen RAMAN wrote: Red Word that prompted transfer to Nurse Triage: Worsening productive cough and congestion.   ----------------------------------------------------------------------- From previous Reason for Contact - Scheduling: Patient/patient representative is calling to schedule an appointment. Refer to attachments for appointment information. Reason for Disposition  [1] MILD difficulty breathing (e.g., minimal/no SOB at rest, SOB with walking, pulse <100) AND [2] still present when not coughing  Answer Assessment - Initial Assessment Questions 1. ONSET: When did the cough begin?      Over weekend  2. SEVERITY: How bad is the cough today?      Got worse  3. SPUTUM: Describe the color of your sputum (none, dry cough; clear, white, yellow, green)     Yes mucus  5. DIFFICULTY BREATHING: Are you having difficulty breathing? If Yes, ask: How bad is it? (e.g., mild, moderate, severe)    - MILD: No SOB at rest, mild SOB with walking, speaks normally in sentences, can lie down, no retractions, pulse < 100.    - MODERATE: SOB at rest, SOB with minimal exertion and prefers to sit, cannot lie down flat, speaks in phrases, mild retractions,  audible wheezing, pulse 100-120.    - SEVERE: Very SOB at rest, speaks in single words, struggling to breathe, sitting hunched forward, retractions, pulse > 120      Mild SOB 6. FEVER: Do you have a fever? If Yes, ask: What is your temperature, how was it measured, and when did it start?     no 10. OTHER SYMPTOMS: Do you have any other symptoms? (e.g., runny nose, wheezing, chest pain)  Protocols used: Cough - Acute Productive-A-AH

## 2024-01-25 NOTE — ED Provider Notes (Signed)
 [place] [facility] AT [facility] Provider Note   CSN: [id number] Arrival date & time: 01/25/24  1121     Patient presents with: Cough   Cassie White is a 83 y.o. female.   The history is provided by the patient, a relative and medical records. No language interpreter was used.  Cough Cough characteristics:  Non-productive Severity:  Moderate Onset quality:  Gradual Duration:  1 week Timing:  Constant Progression:  Waxing and waning Chronicity:  New Context: sick contacts and upper respiratory infection   Relieved by:  Nothing Worsened by:  Nothing Ineffective treatments:  None tried Associated symptoms: sinus congestion   Associated symptoms: no chest pain, no chills, no diaphoresis, no fever, no headaches, no rash, no rhinorrhea, no shortness of breath, no sore throat and no wheezing        Prior to Admission medications   Medication Sig Start Date End Date Taking? Authorizing Provider  betamethasone dipropionate 0.05 % lotion  02/26/22   [provider]  Biotin  5000 MCG CAPS Take 5,000 mcg by mouth daily.    [provider]  Calcium  Carbonate (CALCIUM  600 PO) Take 600 mg by mouth 2 (two) times daily.     [provider]  carbidopa -levodopa  (SINEMET  CR) 50-200 MG tablet TAKE 1 TABLET BY MOUTH EVERYDAY AT BEDTIME 11/23/23   Tat, Rebecca S, DO  carbidopa -levodopa  (SINEMET  IR) 25-100 MG tablet Take  2 at 8am, Take 2 at 11 am,  Take 2 at 2 pm and 1 at 5pm 04/27/23   Tat, Rebecca S, DO  cyanocobalamin  (VITAMIN B12) 1000 MCG/ML injection INJECT 1 ML (1,000 MCG) INTRAMUSCULARLY EVERY 30 DAYS 11/06/23   [name], [name], MD  ELIQUIS  5 MG TABS tablet TAKE 1 TABLET BY MOUTH TWICE A DAY 12/15/23   [name], Peter M, MD  flecainide  (TAMBOCOR ) 100 MG tablet Take 1 tablet (100 mg total) by mouth 2 (two) times daily. 12/21/23   [name], Peter M, MD  fluticasone  (FLONASE ) 50 MCG/ACT nasal spray Place 2 sprays into both nostrils daily. 09/08/22    [name], [name], MD  gabapentin  (NEURONTIN ) 300 MG capsule TAKE 2 CAPSULES BY MOUTH TWICE A DAY 09/18/23   [name], [name], MD  metoprolol  tartrate (LOPRESSOR ) 25 MG tablet Take 1 tablet (25 mg total) by mouth 2 (two) times daily. 09/18/23   [name], [name], MD  nitroGLYCERIN  (NITROSTAT ) 0.4 MG SL tablet PLACE 1 TABLET UNDER THE TONGUE EVERY 5 MINUTES AS NEEDED FOR CHEST PAIN. 12/18/22   [name], [name], MD  Omega-3 Fatty Acids (FISH OIL ) 1200 MG CAPS Take 1,000 mg by mouth 2 (two) times daily.     [provider]  pravastatin  (PRAVACHOL ) 40 MG tablet TAKE 1 TABLET BY MOUTH EVERY DAY 12/24/23   [name], Peter M, MD  raloxifene  (EVISTA ) 60 MG tablet TAKE 1 TABLET BY MOUTH EVERY DAY 12/25/23   [name], [name], MD  traZODone  (DESYREL ) 100 MG tablet TAKE 1 TABLET BY MOUTH EVERYDAY AT BEDTIME 11/23/23   Tat, Asberry RAMAN, DO    Allergies: Sulfa antibiotics and Tizanidine     Review of Systems  Constitutional:  Positive for fatigue. Negative for chills, diaphoresis and fever.  HENT:  Positive for congestion. Negative for rhinorrhea and sore throat.   Respiratory:  Positive for cough. Negative for chest tightness, shortness of breath and wheezing.   Cardiovascular:  Negative for chest pain, palpitations and leg swelling.  Gastrointestinal:  Negative for abdominal pain, constipation, diarrhea, nausea and  vomiting.  Genitourinary:  Negative for dysuria.  Musculoskeletal:  Negative for back pain, neck pain and neck stiffness.  Skin:  Negative for rash and wound.  Neurological:  Negative for headaches.  Psychiatric/Behavioral:  Negative for agitation and confusion.   All other systems reviewed and are negative.   Updated Vital Signs BP 108/71   Pulse (!) 57   Temp 98.1 F (36.7 C) (Oral)   Resp 16   Ht 5' 1 (1.549 m)   Wt 60.8 kg   SpO2 96%   BMI 25.32 kg/m   Physical Exam Vitals and nursing note reviewed.  Constitutional:      General: She is not in acute distress.     Appearance: She is well-developed. She is not ill-appearing, toxic-appearing or diaphoretic.  HENT:     Head: Normocephalic and atraumatic.     Nose: Congestion and rhinorrhea present.     Mouth/Throat:     Mouth: Mucous membranes are moist.     Pharynx: No oropharyngeal exudate or posterior oropharyngeal erythema.   Eyes:     Extraocular Movements: Extraocular movements intact.     Conjunctiva/sclera: Conjunctivae normal.     Pupils: Pupils are equal, round, and reactive to light.    Cardiovascular:     Rate and Rhythm: Normal rate and regular rhythm.     Pulses: Normal pulses.     Heart sounds: No murmur heard. Pulmonary:     Effort: Pulmonary effort is normal. No respiratory distress.     Breath sounds: Normal breath sounds. No wheezing, rhonchi or rales.  Chest:     Chest wall: No tenderness.  Abdominal:     General: Abdomen is flat.     Palpations: Abdomen is soft.     Tenderness: There is no abdominal tenderness. There is no right CVA tenderness, left CVA tenderness, guarding or rebound.   Musculoskeletal:        General: No swelling or tenderness.     Cervical back: Neck supple.     Right lower leg: No edema.     Left lower leg: No edema.   Skin:    General: Skin is warm and dry.     Capillary Refill: Capillary refill takes less than 2 seconds.     Findings: No erythema or rash.   Neurological:     General: No focal deficit present.     Mental Status: She is alert.   Psychiatric:        Mood and Affect: Mood normal.     (all labs ordered are listed, but only abnormal results are displayed) Labs Reviewed  RESP PANEL BY RT-PCR (RSV, FLU A&B, COVID)  [id number]    EKG: None  Radiology: DG Chest 2 View Result Date: 01/25/2024 CLINICAL DATA:  ShOB EXAM: CHEST - 2 VIEW COMPARISON:  03/17/2023 FINDINGS: Lungs are clear. Subcentimeter calcified granuloma in the posterior right lower lobe. Heart size and mediastinal contours are within normal limits. Aortic  Atherosclerosis (ICD10-170.0). No effusion. Compression deformity T12 stable since 04/21/2021. IMPRESSION: No acute cardiopulmonary disease. Electronically Signed   By: JONETTA Faes M.D.   On: 01/25/2024 13:21     Procedures   Medications Ordered in the ED - No data to display                                  Medical Decision Making Amount and/or Complexity of Data Reviewed Radiology: ordered.  Risk  Prescription drug management.    Cassie White is a 83 y.o. female with a past medical history significant for hypertension, hyperlipidemia, paroxysmal atrial fibrillation on Eliquis  therapy, GERD, and mitral disease who presents with URI symptoms.  According to patient and family, for the last week she has had cough and congestion and rhinorrhea.  She has felt tired but she has not had any documented fevers or chills.  She reports no nausea, vomiting, constipation or diarrhea.  Denies chest pain or shortness of breath.  Denies any pain anywhere.  Denies rashes.  Reports that multiple family numbers have had brief URIs in the home but no one has tested positive for COVID or flu.  Otherwise with his persistent cough, family want her to get assessed and evaluated.  She has not had any production in her cough and has had no hemoptysis.  On exam, lungs were clear without rhonchi wheezing or rales.  Chest nontender.  Back nontender.  Abdomen nontender.  Patient resting comfortably without acute distress.  She did have some rhinorrhea and congestion on exam but otherwise reassuring evaluation.  We had a shared decision-making conversation with patient and family we agreed to get chest x-ray and a COVID/flu/RSV swab.  We also offered blood work given her fatigue but they did not want to get a large panel blood work at this time as otherwise patient is resting comfortably.  If imaging and workup reassuring, suspect this is a viral syndrome that is gone to the family and is made the patient fatigued and  tired.  Anticipate discharge if workup reassuring.      X-ray shows no pneumonia and patient is negative for COVID/flu/RSV.  We reassessed patient and she still is feeling well and agrees with not pursuing lab work or further workup.  Patient will follow-up with PCP and understood return precautions.  She had no other questions or concerns and was discharged in good condition with prescription for some cough medicine and conservative management strategy recommendations.      Final diagnoses:  Acute cough  Viral URI with cough  Upper respiratory tract infection, unspecified type    ED Discharge Orders          Ordered    benzonatate (TESSALON) 100 MG capsule  2 times daily PRN        01/25/24 1336            Clinical Impression: 1. Acute cough   2. Viral URI with cough   3. Upper respiratory tract infection, unspecified type     Disposition: Discharge  Condition: Good  I have discussed the results, Dx and Tx plan with the pt(& family if present). He/she/they expressed understanding and agree(s) with the plan. Discharge instructions discussed at great length. Strict return precautions discussed and pt &/or family have verbalized understanding of the instructions. No further questions at time of discharge.    New Prescriptions   BENZONATATE (TESSALON) 100 MG CAPSULE    Take 1 capsule (100 mg total) by mouth 2 (two) times daily as needed for cough.    Follow Up: Candise [name], MD [address] [place] [address] [phone]     [facility] Emergency Department at [facility] [address] [place]  [address] [phone]       Kallan Bischoff, Lonni PARAS, MD 01/25/24 1343

## 2024-01-25 NOTE — Discharge Instructions (Signed)
 Your history, exam, workup today did not show pneumonia on exam or imaging and your labs did not show evidence of COVID, flu, or RSV.  We had a shared decision made conversation and agreed to hold on extensive lab testing given her otherwise well appearance and reassuring vital signs.  Please rest and stay hydrated and consider using the cough medicine.  Please follow-up with your primary doctor.  If any symptoms change or worsen acutely, please return to the nearest emergency department.

## 2024-01-25 NOTE — ED Triage Notes (Signed)
 Pt presents with 1 week hx of productive cough, body aches, and sore throat. She has had some associated ShOB. Denies N/V/D or CP. Pt has positive sick contact. She has tried Mucinex and Flonase  without relief.

## 2024-01-26 ENCOUNTER — Inpatient Hospital Stay (HOSPITAL_COMMUNITY)
Admission: EM | Admit: 2024-01-26 | Discharge: 2024-02-02 | DRG: 871 | Disposition: A | Discharge status: Skilled Nursing Facility | Attending: Internal Medicine | Admitting: Internal Medicine

## 2024-01-26 ENCOUNTER — Emergency Department (HOSPITAL_COMMUNITY)

## 2024-01-26 ENCOUNTER — Encounter (HOSPITAL_COMMUNITY): Payer: Self-pay

## 2024-01-26 DIAGNOSIS — K2100 Gastro-esophageal reflux disease with esophagitis, without bleeding: Secondary | ICD-10-CM

## 2024-01-26 DIAGNOSIS — K219 Gastro-esophageal reflux disease without esophagitis: Secondary | ICD-10-CM | POA: Diagnosis present

## 2024-01-26 DIAGNOSIS — Z882 Allergy status to sulfonamides status: Secondary | ICD-10-CM | POA: Diagnosis not present

## 2024-01-26 DIAGNOSIS — J690 Pneumonitis due to inhalation of food and vomit: Secondary | ICD-10-CM | POA: Diagnosis not present

## 2024-01-26 DIAGNOSIS — G20A1 Parkinson's disease without dyskinesia, without mention of fluctuations: Secondary | ICD-10-CM | POA: Diagnosis not present

## 2024-01-26 DIAGNOSIS — I2510 Atherosclerotic heart disease of native coronary artery without angina pectoris: Secondary | ICD-10-CM | POA: Diagnosis not present

## 2024-01-26 DIAGNOSIS — Z79899 Other long term (current) drug therapy: Secondary | ICD-10-CM | POA: Diagnosis not present

## 2024-01-26 DIAGNOSIS — I480 Paroxysmal atrial fibrillation: Secondary | ICD-10-CM

## 2024-01-26 DIAGNOSIS — R6520 Severe sepsis without septic shock: Secondary | ICD-10-CM | POA: Diagnosis present

## 2024-01-26 DIAGNOSIS — I2489 Other forms of acute ischemic heart disease: Secondary | ICD-10-CM | POA: Diagnosis present

## 2024-01-26 DIAGNOSIS — I081 Rheumatic disorders of both mitral and tricuspid valves: Secondary | ICD-10-CM | POA: Diagnosis present

## 2024-01-26 DIAGNOSIS — Z825 Family history of asthma and other chronic lower respiratory diseases: Secondary | ICD-10-CM

## 2024-01-26 DIAGNOSIS — I10 Essential (primary) hypertension: Secondary | ICD-10-CM | POA: Diagnosis present

## 2024-01-26 DIAGNOSIS — Z888 Allergy status to other drugs, medicaments and biological substances status: Secondary | ICD-10-CM

## 2024-01-26 DIAGNOSIS — Z90710 Acquired absence of both cervix and uterus: Secondary | ICD-10-CM

## 2024-01-26 DIAGNOSIS — D693 Immune thrombocytopenic purpura: Secondary | ICD-10-CM | POA: Diagnosis present

## 2024-01-26 DIAGNOSIS — R7989 Other specified abnormal findings of blood chemistry: Secondary | ICD-10-CM | POA: Diagnosis not present

## 2024-01-26 DIAGNOSIS — Z96611 Presence of right artificial shoulder joint: Secondary | ICD-10-CM | POA: Diagnosis not present

## 2024-01-26 DIAGNOSIS — M4125 Other idiopathic scoliosis, thoracolumbar region: Secondary | ICD-10-CM | POA: Diagnosis present

## 2024-01-26 DIAGNOSIS — E785 Hyperlipidemia, unspecified: Secondary | ICD-10-CM | POA: Diagnosis not present

## 2024-01-26 DIAGNOSIS — Z96652 Presence of left artificial knee joint: Secondary | ICD-10-CM | POA: Diagnosis present

## 2024-01-26 DIAGNOSIS — G9341 Metabolic encephalopathy: Secondary | ICD-10-CM | POA: Diagnosis not present

## 2024-01-26 DIAGNOSIS — J9601 Acute respiratory failure with hypoxia: Secondary | ICD-10-CM | POA: Diagnosis present

## 2024-01-26 DIAGNOSIS — I4819 Other persistent atrial fibrillation: Secondary | ICD-10-CM | POA: Diagnosis present

## 2024-01-26 DIAGNOSIS — Z823 Family history of stroke: Secondary | ICD-10-CM

## 2024-01-26 DIAGNOSIS — J189 Pneumonia, unspecified organism: Principal | ICD-10-CM

## 2024-01-26 DIAGNOSIS — D72819 Decreased white blood cell count, unspecified: Secondary | ICD-10-CM | POA: Diagnosis not present

## 2024-01-26 DIAGNOSIS — Z8669 Personal history of other diseases of the nervous system and sense organs: Secondary | ICD-10-CM

## 2024-01-26 DIAGNOSIS — R2681 Unsteadiness on feet: Secondary | ICD-10-CM | POA: Diagnosis not present

## 2024-01-26 DIAGNOSIS — M545 Low back pain, unspecified: Secondary | ICD-10-CM | POA: Diagnosis not present

## 2024-01-26 DIAGNOSIS — Z9049 Acquired absence of other specified parts of digestive tract: Secondary | ICD-10-CM

## 2024-01-26 DIAGNOSIS — I6782 Cerebral ischemia: Secondary | ICD-10-CM | POA: Diagnosis not present

## 2024-01-26 DIAGNOSIS — M810 Age-related osteoporosis without current pathological fracture: Secondary | ICD-10-CM | POA: Diagnosis present

## 2024-01-26 DIAGNOSIS — J123 Human metapneumovirus pneumonia: Secondary | ICD-10-CM | POA: Diagnosis present

## 2024-01-26 DIAGNOSIS — A419 Sepsis, unspecified organism: Principal | ICD-10-CM | POA: Diagnosis present

## 2024-01-26 DIAGNOSIS — D6869 Other thrombophilia: Secondary | ICD-10-CM | POA: Diagnosis not present

## 2024-01-26 DIAGNOSIS — Z7901 Long term (current) use of anticoagulants: Secondary | ICD-10-CM

## 2024-01-26 DIAGNOSIS — Z8249 Family history of ischemic heart disease and other diseases of the circulatory system: Secondary | ICD-10-CM | POA: Diagnosis not present

## 2024-01-26 DIAGNOSIS — I700 Atherosclerosis of aorta: Secondary | ICD-10-CM | POA: Diagnosis not present

## 2024-01-26 DIAGNOSIS — N3000 Acute cystitis without hematuria: Secondary | ICD-10-CM | POA: Diagnosis present

## 2024-01-26 DIAGNOSIS — I341 Nonrheumatic mitral (valve) prolapse: Secondary | ICD-10-CM | POA: Diagnosis not present

## 2024-01-26 DIAGNOSIS — Z8616 Personal history of COVID-19: Secondary | ICD-10-CM

## 2024-01-26 DIAGNOSIS — K5730 Diverticulosis of large intestine without perforation or abscess without bleeding: Secondary | ICD-10-CM | POA: Diagnosis not present

## 2024-01-26 DIAGNOSIS — R0602 Shortness of breath: Secondary | ICD-10-CM | POA: Diagnosis not present

## 2024-01-26 DIAGNOSIS — R6889 Other general symptoms and signs: Secondary | ICD-10-CM | POA: Diagnosis not present

## 2024-01-26 DIAGNOSIS — M6281 Muscle weakness (generalized): Secondary | ICD-10-CM | POA: Diagnosis not present

## 2024-01-26 DIAGNOSIS — Z1152 Encounter for screening for COVID-19: Secondary | ICD-10-CM

## 2024-01-26 DIAGNOSIS — J9811 Atelectasis: Secondary | ICD-10-CM | POA: Diagnosis not present

## 2024-01-26 DIAGNOSIS — Z8041 Family history of malignant neoplasm of ovary: Secondary | ICD-10-CM

## 2024-01-26 DIAGNOSIS — Z7401 Bed confinement status: Secondary | ICD-10-CM | POA: Diagnosis not present

## 2024-01-26 DIAGNOSIS — G629 Polyneuropathy, unspecified: Secondary | ICD-10-CM | POA: Diagnosis not present

## 2024-01-26 DIAGNOSIS — I4581 Long QT syndrome: Secondary | ICD-10-CM | POA: Diagnosis not present

## 2024-01-26 DIAGNOSIS — E872 Acidosis, unspecified: Secondary | ICD-10-CM | POA: Diagnosis present

## 2024-01-26 DIAGNOSIS — Z743 Need for continuous supervision: Secondary | ICD-10-CM | POA: Diagnosis not present

## 2024-01-26 DIAGNOSIS — R9431 Abnormal electrocardiogram [ECG] [EKG]: Secondary | ICD-10-CM | POA: Diagnosis not present

## 2024-01-26 DIAGNOSIS — R918 Other nonspecific abnormal finding of lung field: Secondary | ICD-10-CM | POA: Diagnosis not present

## 2024-01-26 DIAGNOSIS — G250 Essential tremor: Secondary | ICD-10-CM | POA: Diagnosis not present

## 2024-01-26 DIAGNOSIS — G8929 Other chronic pain: Secondary | ICD-10-CM | POA: Diagnosis not present

## 2024-01-26 DIAGNOSIS — I340 Nonrheumatic mitral (valve) insufficiency: Secondary | ICD-10-CM | POA: Diagnosis not present

## 2024-01-26 DIAGNOSIS — G20C Parkinsonism, unspecified: Secondary | ICD-10-CM | POA: Diagnosis not present

## 2024-01-26 DIAGNOSIS — E7849 Other hyperlipidemia: Secondary | ICD-10-CM

## 2024-01-26 LAB — TROPONIN I (HIGH SENSITIVITY)
Troponin I (High Sensitivity): 25 ng/L — ABNORMAL HIGH (ref <18)
Troponin I (High Sensitivity): 25 ng/L — ABNORMAL HIGH (ref <18)

## 2024-01-26 LAB — RESPIRATORY PANEL BY PCR

## 2024-01-26 LAB — RAPID URINE DRUG SCREEN, HOSP PERFORMED
Amphetamines: NOT DETECTED
Barbiturates: NOT DETECTED
Benzodiazepines: NOT DETECTED
Cocaine: NOT DETECTED
Opiates: NOT DETECTED
Tetrahydrocannabinol: NOT DETECTED

## 2024-01-26 LAB — URINALYSIS, ROUTINE W REFLEX MICROSCOPIC
Bilirubin Urine: NEGATIVE
Glucose, UA: NEGATIVE mg/dL
Hgb urine dipstick: NEGATIVE
Ketones, ur: 5 mg/dL — AB
Nitrite: NEGATIVE
Protein, ur: NEGATIVE mg/dL
Specific Gravity, Urine: 1.021 (ref 1.005–1.030)
pH: 6 (ref 5.0–8.0)

## 2024-01-26 LAB — CBC WITH DIFFERENTIAL/PLATELET
Abs Immature Granulocytes: 0.01 10*3/uL (ref 0.00–0.07)
Basophils Absolute: 0 10*3/uL (ref 0.0–0.1)
Basophils Relative: 0 %
Eosinophils Absolute: 0 10*3/uL (ref 0.0–0.5)
Eosinophils Relative: 0 %
HCT: 38.7 % (ref 36.0–46.0)
Hemoglobin: 12.5 g/dL (ref 12.0–15.0)
Immature Granulocytes: 0 %
Lymphocytes Relative: 26 %
Lymphs Abs: 1.6 10*3/uL (ref 0.7–4.0)
MCH: 31.2 pg (ref 26.0–34.0)
MCHC: 32.3 g/dL (ref 30.0–36.0)
MCV: 96.5 fL (ref 80.0–100.0)
Monocytes Absolute: 0.7 10*3/uL (ref 0.1–1.0)
Monocytes Relative: 11 %
Neutro Abs: 3.7 10*3/uL (ref 1.7–7.7)
Neutrophils Relative %: 63 %
Platelets: 101 10*3/uL — ABNORMAL LOW (ref 150–400)
RBC: 4.01 MIL/uL (ref 3.87–5.11)
RDW: 13.1 % (ref 11.5–15.5)
WBC: 6 10*3/uL (ref 4.0–10.5)
nRBC: 0 % (ref 0.0–0.2)

## 2024-01-26 LAB — BASIC METABOLIC PANEL WITH GFR
Anion gap: 15 (ref 5–15)
BUN: 12 mg/dL (ref 8–23)
CO2: 21 mmol/L — ABNORMAL LOW (ref 22–32)
Calcium: 8.6 mg/dL — ABNORMAL LOW (ref 8.9–10.3)
Chloride: 101 mmol/L (ref 98–111)
Creatinine, Ser: 0.76 mg/dL (ref 0.44–1.00)
GFR, Estimated: >60 mL/min (ref >60)
Glucose, Bld: 129 mg/dL — ABNORMAL HIGH (ref 70–99)
Potassium: 3.8 mmol/L (ref 3.5–5.1)
Sodium: 137 mmol/L (ref 135–145)

## 2024-01-26 LAB — PROCALCITONIN: Procalcitonin: <0.1 ng/mL

## 2024-01-26 LAB — BRAIN NATRIURETIC PEPTIDE: B Natriuretic Peptide: 338 pg/mL — ABNORMAL HIGH (ref 0.0–100.0)

## 2024-01-26 LAB — STREP PNEUMONIAE URINARY ANTIGEN: Strep Pneumo Urinary Antigen: NEGATIVE

## 2024-01-26 LAB — CBG MONITORING, ED: Glucose-Capillary: 151 mg/dL — ABNORMAL HIGH (ref 70–99)

## 2024-01-26 MED ORDER — ACETAMINOPHEN 325 MG PO TABS
650.0000 mg | ORAL_TABLET | Freq: Four times a day (QID) | ORAL | Status: DC | PRN
  Administered 2024-01-27 – 2024-02-01 (×4): 650 mg via ORAL
  Filled 2024-01-26 (×4): qty 2

## 2024-01-26 MED ORDER — FLECAINIDE ACETATE 100 MG PO TABS
100.0000 mg | ORAL_TABLET | Freq: Two times a day (BID) | ORAL | Status: DC
  Administered 2024-01-26 – 2024-02-02 (×14): 100 mg via ORAL
  Filled 2024-01-26 (×2): qty 2
  Filled 2024-01-26: qty 1
  Filled 2024-01-26: qty 2
  Filled 2024-01-26 (×9): qty 1
  Filled 2024-01-26: qty 2
  Filled 2024-01-26: qty 1

## 2024-01-26 MED ORDER — ONDANSETRON HCL 4 MG PO TABS: 4.0000 mg | ORAL_TABLET | Freq: Four times a day (QID) | ORAL | Status: DC | PRN

## 2024-01-26 MED ORDER — SODIUM CHLORIDE 0.9% FLUSH: 3.0000 mL | INTRAVENOUS | Status: DC | PRN

## 2024-01-26 MED ORDER — RALOXIFENE HCL 60 MG PO TABS: 60.0000 mg | ORAL_TABLET | Freq: Every day | ORAL | Status: DC

## 2024-01-26 MED ORDER — SODIUM CHLORIDE 0.9 % IV SOLN
2.0000 g | INTRAVENOUS | Status: DC
  Administered 2024-01-27 – 2024-01-31 (×5): 2 g via INTRAVENOUS
  Filled 2024-01-26 (×5): qty 20

## 2024-01-26 MED ORDER — CARBIDOPA-LEVODOPA 25-100 MG PO TABS
1.0000 | ORAL_TABLET | ORAL | Status: DC
  Administered 2024-01-27 – 2024-02-01 (×6): 1 via ORAL
  Filled 2024-01-26 (×7): qty 1

## 2024-01-26 MED ORDER — TRAZODONE HCL 50 MG PO TABS
100.0000 mg | ORAL_TABLET | Freq: Every day | ORAL | Status: DC
  Administered 2024-01-26 – 2024-02-01 (×7): 100 mg via ORAL
  Filled 2024-01-26 (×8): qty 2

## 2024-01-26 MED ORDER — PRAVASTATIN SODIUM 40 MG PO TABS
40.0000 mg | ORAL_TABLET | Freq: Every day | ORAL | Status: DC
  Administered 2024-01-27 – 2024-02-01 (×6): 40 mg via ORAL
  Filled 2024-01-26 (×6): qty 1

## 2024-01-26 MED ORDER — SODIUM CHLORIDE 0.9 % IV SOLN: 3.0000 g | Freq: Four times a day (QID) | INTRAVENOUS | Status: DC

## 2024-01-26 MED ORDER — CARBIDOPA-LEVODOPA 25-100 MG PO TABS
2.0000 | ORAL_TABLET | Freq: Three times a day (TID) | ORAL | Status: DC
  Administered 2024-01-27 – 2024-02-01 (×16): 2 via ORAL
  Filled 2024-01-26 (×21): qty 2

## 2024-01-26 MED ORDER — LACTATED RINGERS IV BOLUS
250.0000 mL | Freq: Once | INTRAVENOUS | Status: AC
  Administered 2024-01-26: 250 mL via INTRAVENOUS

## 2024-01-26 MED ORDER — IOHEXOL 350 MG/ML SOLN
75.0000 mL | Freq: Once | INTRAVENOUS | Status: AC | PRN
  Administered 2024-01-26: 75 mL via INTRAVENOUS

## 2024-01-26 MED ORDER — SODIUM CHLORIDE 0.9% FLUSH
3.0000 mL | Freq: Two times a day (BID) | INTRAVENOUS | Status: DC
  Administered 2024-01-26 – 2024-02-02 (×13): 3 mL via INTRAVENOUS

## 2024-01-26 MED ORDER — LACTATED RINGERS IV SOLN: INTRAVENOUS | Status: AC

## 2024-01-26 MED ORDER — SODIUM CHLORIDE 0.9 % IV SOLN
500.0000 mg | Freq: Once | INTRAVENOUS | Status: AC
  Administered 2024-01-26: 500 mg via INTRAVENOUS
  Filled 2024-01-26: qty 5

## 2024-01-26 MED ORDER — SODIUM CHLORIDE 0.9 % IV SOLN: 250.0000 mL | INTRAVENOUS | Status: AC | PRN

## 2024-01-26 MED ORDER — ACETAMINOPHEN 650 MG RE SUPP: 650.0000 mg | Freq: Four times a day (QID) | RECTAL | Status: DC | PRN

## 2024-01-26 MED ORDER — CARBIDOPA-LEVODOPA ER 50-200 MG PO TBCR
1.0000 | EXTENDED_RELEASE_TABLET | Freq: Every day | ORAL | Status: DC
  Administered 2024-01-26 – 2024-02-01 (×7): 1 via ORAL
  Filled 2024-01-26 (×8): qty 1

## 2024-01-26 MED ORDER — APIXABAN 5 MG PO TABS
5.0000 mg | ORAL_TABLET | Freq: Two times a day (BID) | ORAL | Status: DC
  Administered 2024-01-26 – 2024-02-02 (×14): 5 mg via ORAL
  Filled 2024-01-26 (×14): qty 1

## 2024-01-26 MED ORDER — IPRATROPIUM-ALBUTEROL 0.5-2.5 (3) MG/3ML IN SOLN
3.0000 mL | Freq: Four times a day (QID) | RESPIRATORY_TRACT | Status: DC | PRN
  Administered 2024-01-27 – 2024-01-29 (×2): 3 mL via RESPIRATORY_TRACT
  Filled 2024-01-26 (×2): qty 3

## 2024-01-26 MED ORDER — ONDANSETRON HCL 4 MG/2ML IJ SOLN: 4.0000 mg | Freq: Four times a day (QID) | INTRAMUSCULAR | Status: DC | PRN

## 2024-01-26 MED ORDER — CARBIDOPA-LEVODOPA 25-100 MG PO TABS: 1.0000 | ORAL_TABLET | ORAL | Status: DC

## 2024-01-26 MED ORDER — SODIUM CHLORIDE 0.9 % IV SOLN
100.0000 mg | Freq: Two times a day (BID) | INTRAVENOUS | Status: DC
  Administered 2024-01-27: 100 mg via INTRAVENOUS
  Filled 2024-01-26 (×2): qty 100

## 2024-01-26 MED ORDER — GUAIFENESIN ER 600 MG PO TB12
600.0000 mg | ORAL_TABLET | Freq: Two times a day (BID) | ORAL | Status: DC
  Administered 2024-01-26 – 2024-02-02 (×14): 600 mg via ORAL
  Filled 2024-01-26 (×14): qty 1

## 2024-01-26 MED ORDER — SODIUM CHLORIDE 0.9 % IV SOLN
1.0000 g | Freq: Once | INTRAVENOUS | Status: AC
  Administered 2024-01-26: 1 g via INTRAVENOUS
  Filled 2024-01-26: qty 10

## 2024-01-26 MED ORDER — SODIUM CHLORIDE 0.9% FLUSH
3.0000 mL | Freq: Two times a day (BID) | INTRAVENOUS | Status: DC
  Administered 2024-01-26 – 2024-02-02 (×12): 3 mL via INTRAVENOUS

## 2024-01-26 NOTE — H&P (Signed)
 History and Physical    Cassie White FMW:[id number] DOB: [birth year] DOA: 01/26/2024  PCP: Candise [name], MD   Patient coming from: Home   Chief Complaint:  Chief Complaint  Patient presents with   Shortness of Breath   Altered Mental Status   Respiratory Distress   ED TRIAGE note: BIB [facility] for AMS and SOB. Seen yesterday at [facility], dx with URI. Family states she woke up this morning acting confused and could not feed herself. VSS. RR elevated 35            HPI:  Cassie White is a 83 y.o. female with medical history significant of paroxysmal atrial fibrillation, mitral regurgitation, hyperlipidemia, GERD, hyperlipidemia, osteoporosis, essential hypertension, chronic thrombocytopenia, GERD, and Parkinson disease presented to emergency department for development of confusion this morning, cough and shortness of breath.  Family also reported patient has some right-sided weakness unable to feed herself and she has been missed doses of Sinemet  for last few days. Family reported that over the course of last few days patient has productive cough, shortness of breath, poor appetite and generalized weakness as well as increasing tremor of the bilateral hands more prominent on the right side.  Family at the bedside reported that they have noticed increased tremor of the bilateral upper extremity however it is more relevant on the right side when she was tried to feed herself in the morning which is interpreted as weakness of the right side.  Patient missed 2 days of dose of Sinemet .  Also this morning patient found to be more confused and EMS found patient was hypoxic with shortness of breath on presentation.  Family reported that the last couple of days patient has productive cough, mucoid sputum production and feeling generalized unwell with poor oral intake and appetite. Per bedside patient's family there is no concern for dysarthria, dysphagia, presyncope and syncope.   Family is not concerned that patient did have any episodes of aspiration.  Patient did not had any fever chill.  Denies any nausea, vomiting and known sick contact. Patient has been seen at an outside urgent care clinic and sent home possibly with oral Keflex for upper respiratory tract infection.  ED Course:  At presentation to ED O2 sat dropped to 88% on room air, tachypneic, borderline hypotensive currently O2 sat is 93% on [place] oxygen. BMP panel unremarkable.  CBC unremarkable except low platelet count 101, Elevated BNP 338. Elevated troponin 35.  Pending second troponin level.  Pending UDS, UA. Pending respiratory panel.  Chest x-ray no active disease process.   CT head no acute intracranial abnormality.  CTA chest no evidence of PE.Nodular airspace disease noted in the lingula and both lower lobes, right greater than left concerning for pneumonia.  EKG show atrial fibrillation rate controlled 90, low bundle-branch block.  Prolonged QTc 503  Given family has been complaining about patient has initially left-sided upper extremity weakness which has been resolved ED physician initially approached to neurology Dr. Jerrie in case patient had a episodes of TIA.  Per neurology symptoms he is not related to any evidence of TIA rather than underlying Parkison disease related symptoms and underlying pneumonia; and there is no need for pursue TIA workup here.  In ED patient has been treated with ceftriaxone and azithromycin . Hospitalist has been consulted for management of acute hypoxic respiratory failure in the setting of pneumonia, sepsis in the setting of pneumonia, elevated troponin due to demand ischemia and acute metabolic encephalopathy in the  setting of pneumonia.   Significant labs in the ED: Lab Orders         Respiratory (~20 pathogens) panel by PCR         Culture, blood (routine x 2) Call MD if unable to obtain prior to antibiotics being given         Expectorated Sputum  Assessment w Gram Stain, Rflx to Resp Cult         MRSA Next Gen by PCR, Nasal         Basic metabolic panel         CBC with Differential         Brain natriuretic peptide         Urinalysis, Routine w reflex microscopic -Urine, Clean Catch         Rapid urine drug screen (hospital performed)         Strep pneumoniae urinary antigen         Legionella Pneumophila Serogp 1 Ur Ag         Procalcitonin         CBC         Basic metabolic panel         CBG monitoring, ED         I-Stat venous blood gas, ED       Review of Systems:  Review of Systems  Constitutional:  Positive for malaise/fatigue. Negative for chills, fever and weight loss.  Respiratory:  Positive for cough, sputum production and shortness of breath. Negative for hemoptysis and wheezing.   Cardiovascular:  Negative for chest pain, palpitations, orthopnea, claudication and PND.  Gastrointestinal:  Negative for abdominal pain, blood in stool, constipation, diarrhea, heartburn, nausea and vomiting.  Genitourinary:  Negative for dysuria, frequency and urgency.  Musculoskeletal:  Negative for myalgias.  Neurological:  Positive for tremors. Negative for dizziness, tingling, sensory change, speech change, focal weakness, seizures, loss of consciousness, weakness and headaches.  Psychiatric/Behavioral:  The patient is not nervous/anxious.     Past Medical History:  Diagnosis Date   Abnormal mammogram of left breast    Likely benign microcalcifications--repeat L diag mammo 03/24/2017.  COMPLEX SCLEROSING LESION WITH CALCIFICATIONS --no sign of malignancy.  Excision recommended--pt has been referred to Dr. Ebbie, who recommended f/u mammo and this was normal 12/2017--consider rpt 1 yr per rad.   Arthritis    DJD, back and both hips   Chronic low back pain without sciatica    Lumbar spondylosis + scoliosis.  Summer 2017 Dr. Bonner did ESI and pt got no relief.  Pt then saw Dr. Darlean [facility]: felt she had facet  mediated pain; plan for B L345 MBB (??).   COVID 08/2022   Cystocele, midline    Diverticulosis    on colonoscopy 2004   GERD (gastroesophageal reflux disease)    Hx of cardiovascular stress test    Lexiscan  Myoview  (2/14):  Low risk, small ant defect likely breast attenuation, no ischemia, EF 69% (no change from 2010).     Hyperlipidemia    Hypertension    per pt   Idiopathic scoliosis of thoracolumbar region    Lumbar back pain    Mitral regurgitation    Echo (2/14):  EF 60-65%, Gr 1 DD, mild AI, mild bileaflet MVP, mod post directed MR, mild LAE, PASP 35.   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  Responding to gabapentin  (Dr.  Dohmeier).   Osteoporosis, senile    stable left hip 2015 (took fosamax  x 29yrs, T-score -2.7 when she stopped bisphosphonate).  Rpt DEXA 42mo later (09/2016) T-score -.3.1 (different machine, though). 11/2019 DEXA T-score -4. Recommended restart of fosamax    PAF (paroxysmal atrial fibrillation) (HCC)    Dr. [name]: Flecainide , metopr, apxiaban.  08/2017-->persistent a-fib w/RVR, flecainide  increased and she converted back to NSR and felt better.  100mg  bid flecainide  continued.   Parkinson's disease (HCC) 2020 dx   started sinemet  05/2019 (Dr. Evonnie)   Peripheral neuropathy 2008   Idiopathic vs familial (motor>sensory) vs lumbar disc dz: gabapentin  helpful   Shoulder pain left   RC surg 09/2014   Unsteady gait     Past Surgical History:  Procedure Laterality Date   BREAST BIOPSY  04/10/2017   Left breast core needle biopsy of calcifications.  COMPLEX SCLEROSING LESION WITH CALCIFICATIONS --no sign of malignancy.  Excision recommended--pt has been referred to Dr. Ebbie, who recommended rpt mammo and this was NORMAL 12/2017.   BREAST EXCISIONAL BIOPSY Right 2010   Benign   BREAST SURGERY  Nov 2010   Benign biopsy, right   CHOLECYSTECTOMY  03/2010   Dr.Gross   COLONOSCOPY   10/03/2002   No polyps.  Repeat 10 yrs recommended but pt declines.  Pt declined cologuard 08/2016 but changed her mind and this test was NEG on 03/23/17.   COMBINED HYSTERECTOMY VAGINAL / OOPHORECTOMY / A&P REPAIR  1990   uncertain if ovaries were removed or not   CYSTOCELE REPAIR     DEXA  11/2019   T score -4   Lumpectomy  06/2009   Fatty Necrosis   PFT's  2014   NORMAL   REVERSE SHOULDER ARTHROPLASTY Right 03/11/2019   Procedure: REVERSE TOTAL SHOULDER ARTHROPLASTY;  Surgeon: Kay Kemps, MD;  Location: [facility];  Service: Orthopedics;  Laterality: Right;  Intersclene block   ROTATOR CUFF REPAIR Left 10/02/14   Dr. Kay   TONSILLECTOMY     TOTAL KNEE ARTHROPLASTY  09/2009   Right ;Dr Ernie   TOTAL KNEE ARTHROPLASTY Left 03/10/2016   Procedure: TOTAL KNEE ARTHROPLASTY;  Surgeon: Donnice Ernie, MD;  Location: [facility];  Service: Orthopedics;  Laterality: Left;   TRANSTHORACIC ECHOCARDIOGRAM  09/2012; 09/27/15   2014: EF 60-65%, Gr 1 DD, mild AI, mild bileaflet MVP, mod post directed MR, mild LAE, PASP 35.  Repeat 2017: EF 55-60%, mild AR, mild MVP and mild MV regurg.   VAGINAL HYSTERECTOMY     For Uterine Deviation      reports that she has never smoked. She has never used smokeless tobacco. She reports that she does not drink alcohol and does not use drugs.  Allergies  Allergen Reactions   Sulfa Antibiotics Swelling    face   Tizanidine  Other (See Comments)    delirium    Family History  Problem Relation Age of Onset   Heart attack Father        in 16s   Stroke Father 33   Ovarian cancer Mother    Heart attack Son 75       Smoker   Coronary artery disease Brother    COPD Brother    Healthy Son    Healthy Daughter    Diabetes Neg Hx     Prior to Admission medications   Medication Sig Start Date End Date Taking? Authorizing Provider  benzonatate (TESSALON) 100 MG capsule Take 1 capsule (100 mg total) by mouth 2 (two) times daily  as needed for cough. 01/25/24   Tegeler,  Lonni PARAS, MD  betamethasone dipropionate 0.05 % lotion  02/26/22   [provider]  Biotin  5000 MCG CAPS Take 5,000 mcg by mouth daily.    [provider]  Calcium  Carbonate (CALCIUM  600 PO) Take 600 mg by mouth 2 (two) times daily.     [provider]  carbidopa -levodopa  (SINEMET  CR) 50-200 MG tablet TAKE 1 TABLET BY MOUTH EVERYDAY AT BEDTIME 11/23/23   Tat, Asberry RAMAN, DO  carbidopa -levodopa  (SINEMET  IR) 25-100 MG tablet Take  2 at 8am, Take 2 at 11 am,  Take 2 at 2 pm and 1 at 5pm 04/27/23   Tat, Rebecca S, DO  cyanocobalamin  (VITAMIN B12) 1000 MCG/ML injection INJECT 1 ML (1,000 MCG) INTRAMUSCULARLY EVERY 30 DAYS 11/06/23   [name], [name], MD  ELIQUIS  5 MG TABS tablet TAKE 1 TABLET BY MOUTH TWICE A DAY 12/15/23   [name], Peter M, MD  flecainide  (TAMBOCOR ) 100 MG tablet Take 1 tablet (100 mg total) by mouth 2 (two) times daily. 12/21/23   [name], Peter M, MD  fluticasone  (FLONASE ) 50 MCG/ACT nasal spray Place 2 sprays into both nostrils daily. 09/08/22   [name], [name], MD  gabapentin  (NEURONTIN ) 300 MG capsule TAKE 2 CAPSULES BY MOUTH TWICE A DAY 09/18/23   [name], [name], MD  metoprolol  tartrate (LOPRESSOR ) 25 MG tablet Take 1 tablet (25 mg total) by mouth 2 (two) times daily. 09/18/23   [name], [name], MD  nitroGLYCERIN  (NITROSTAT ) 0.4 MG SL tablet PLACE 1 TABLET UNDER THE TONGUE EVERY 5 MINUTES AS NEEDED FOR CHEST PAIN. 12/18/22   [name], [name], MD  Omega-3 Fatty Acids (FISH OIL ) 1200 MG CAPS Take 1,000 mg by mouth 2 (two) times daily.     [provider]  pravastatin  (PRAVACHOL ) 40 MG tablet TAKE 1 TABLET BY MOUTH EVERY DAY 12/24/23   [name], Peter M, MD  raloxifene  (EVISTA ) 60 MG tablet TAKE 1 TABLET BY MOUTH EVERY DAY 12/25/23   [name], [name], MD  traZODone  (DESYREL ) 100 MG tablet TAKE 1 TABLET BY MOUTH EVERYDAY AT BEDTIME 11/23/23   TatAsberry RAMAN, DO     Physical Exam: Vitals:   01/26/24 2145 01/26/24 2155 01/26/24 2200 01/26/24 2300   BP: 113/81  (!) 129/54 (!) 128/56  Pulse: 81  79 73  Resp: (!) 21  (!) 25 20  Temp:  97.7 F (36.5 C)  97.8 F (36.6 C)  TempSrc:  Oral  Oral  SpO2: 92%  94% 93%  Weight:    64.2 kg  Height:    5' 1 (1.549 m)    Physical Exam Vitals and nursing note reviewed.  Constitutional:      Appearance: She is not ill-appearing.   Cardiovascular:     Rate and Rhythm: Normal rate. Rhythm irregular.  Pulmonary:     Effort: Pulmonary effort is normal.     Breath sounds: Normal breath sounds. No decreased breath sounds, wheezing or rhonchi.   Musculoskeletal:     Right lower leg: No edema.     Left lower leg: No edema.   Skin:    General: Skin is dry.     Capillary Refill: Capillary refill takes less than 2 seconds.   Neurological:     General: No focal deficit present.     Mental Status: She is alert and oriented to person, place, and time.     Cranial Nerves: No cranial nerve deficit.  Motor: No weakness.     Comments: Bilateral upper extremities resting tremor.  Psychiatric:        Mood and Affect: Mood normal. Mood is not anxious.        Behavior: Behavior is not agitated.      Labs on Admission: I have personally reviewed following labs and imaging studies  CBC: Recent Labs  Lab 01/26/24 1645  WBC 6.0  NEUTROABS 3.7  HGB 12.5  HCT 38.7  MCV 96.5  PLT 101*   Basic Metabolic Panel: Recent Labs  Lab 01/26/24 1645  NA 137  K 3.8  CL 101  CO2 21*  GLUCOSE 129*  BUN 12  CREATININE 0.76  CALCIUM  8.6*   GFR: Estimated Creatinine Clearance: 46.6 mL/min (by C-G formula based on SCr of 0.76 mg/dL). Liver Function Tests: No results for input(s): AST, ALT, ALKPHOS, BILITOT, PROT, ALBUMIN in the last 168 hours. No results for input(s): LIPASE, AMYLASE in the last 168 hours. No results for input(s): AMMONIA in the last 168 hours. Coagulation Profile: No results for input(s): INR, PROTIME in the last 168 hours. Cardiac Enzymes: Recent  Labs  Lab 01/26/24 1645 01/26/24 1818  TROPONINIHS 25* 25*   BNP (last 3 results) Recent Labs    01/26/24 1645  BNP 338.0*   HbA1C: No results for input(s): HGBA1C in the last 72 hours. CBG: Recent Labs  Lab 01/26/24 1816  GLUCAP 151*   Lipid Profile: No results for input(s): CHOL, HDL, LDLCALC, TRIG, CHOLHDL, LDLDIRECT in the last 72 hours. Thyroid  Function Tests: No results for input(s): TSH, T4TOTAL, FREET4, T3FREE, THYROIDAB in the last 72 hours. Anemia Panel: No results for input(s): VITAMINB12, FOLATE, FERRITIN, TIBC, IRON, RETICCTPCT in the last 72 hours. Urine analysis:    Component Value Date/Time   COLORURINE YELLOW 01/26/2024 2003   APPEARANCEUR HAZY (A) 01/26/2024 2003   LABSPEC 1.021 01/26/2024 2003   PHURINE 6.0 01/26/2024 2003   GLUCOSEU NEGATIVE 01/26/2024 2003   HGBUR NEGATIVE 01/26/2024 2003   BILIRUBINUR NEGATIVE 01/26/2024 2003   BILIRUBINUR negative 02/28/2022 1420   KETONESUR 5 (A) 01/26/2024 2003   PROTEINUR NEGATIVE 01/26/2024 2003   UROBILINOGEN 0.2 02/28/2022 1420   UROBILINOGEN 0.2 09/07/2013 0006   NITRITE NEGATIVE 01/26/2024 2003   LEUKOCYTESUR TRACE (A) 01/26/2024 2003    Radiological Exams on Admission: I have personally reviewed images CT HEAD WO CONTRAST Result Date: 01/26/2024 CLINICAL DATA:  Mental status change EXAM: CT HEAD WITHOUT CONTRAST TECHNIQUE: Contiguous axial images were obtained from the base of the skull through the vertex without intravenous contrast. RADIATION DOSE REDUCTION: This exam was performed according to the departmental dose-optimization program which includes automated exposure control, adjustment of the mA and/or kV according to patient size and/or use of iterative reconstruction technique. COMPARISON:  CT head 03/17/2023. FINDINGS: Brain: No acute intracranial hemorrhage. No CT evidence of acute infarct. Nonspecific hypoattenuation in the periventricular and subcortical  White matter favored to reflect chronic microvascular ischemic changes. No edema, mass effect, or midline shift. The basilar cisterns are patent. Ventricles: Prominence of the ventricles suggesting underlying parenchymal volume loss. Vascular: Atherosclerotic calcifications of the carotid siphons and intracranial vertebral arteries. No hyperdense vessel. Skull: No acute or aggressive finding. Orbits: Bilateral lens replacement. Orbits are otherwise unremarkable. Sinuses: Mucosal thickening throughout the paranasal sinuses with air-fluid levels in the bilateral maxillary sinuses. Other: Mastoid air cells are clear. IMPRESSION: No CT evidence of acute intracranial abnormality. Paranasal sinus disease with air-fluid levels in the bilateral maxillary sinuses. Recommend clinical  correlation for acute sinusitis. Chronic microvascular ischemic changes and mild parenchymal volume loss. Electronically Signed   By: Donnice Mania M.D.   On: 01/26/2024 19:28   CT Angio Chest PE W and/or Wo Contrast Result Date: 01/26/2024 CLINICAL DATA:  Pulmonary embolism (PE) suspected, high prob EXAM: CT ANGIOGRAPHY CHEST WITH CONTRAST TECHNIQUE: Multidetector CT imaging of the chest was performed using the standard protocol during bolus administration of intravenous contrast. Multiplanar CT image reconstructions and MIPs were obtained to evaluate the vascular anatomy. RADIATION DOSE REDUCTION: This exam was performed according to the departmental dose-optimization program which includes automated exposure control, adjustment of the mA and/or kV according to patient size and/or use of iterative reconstruction technique. CONTRAST:  75mL OMNIPAQUE IOHEXOL 350 MG/ML SOLN COMPARISON:  None Available. FINDINGS: Cardiovascular: No filling defects in the pulmonary arteries to suggest pulmonary emboli. Heart is normal size. Aorta is normal caliber. Moderate aortic atherosclerosis. Scattered coronary artery calcifications. Mediastinum/Nodes: No  mediastinal, hilar, or axillary adenopathy. Trachea and esophagus are unremarkable. Thyroid  unremarkable. Lungs/Pleura: Nodular airspace opacity noted in the lingula as well as bilateral lower lobes. This likely reflects multifocal pneumonia. Bibasilar atelectasis. No effusions. Upper Abdomen: No acute findings Musculoskeletal: Chest wall soft tissues are unremarkable. No acute bony abnormality. Review of the MIP images confirms the above findings. IMPRESSION: Nodular airspace disease noted in the lingula and both lower lobes, right greater than left concerning for pneumonia. No evidence of pulmonary embolus. Aortic Atherosclerosis (ICD10-I70.0). Electronically Signed   By: Franky Crease M.D.   On: 01/26/2024 19:26   DG Chest Port 1 View Result Date: 01/26/2024 CLINICAL DATA:  Shortness of breath EXAM: PORTABLE CHEST 1 VIEW COMPARISON:  Chest x-ray 01/25/2024 FINDINGS: The heart size and mediastinal contours are within normal limits. Both lungs are clear. Right shoulder arthroplasty is present. IMPRESSION: No active disease. Electronically Signed   By: Greig Pique M.D.   On: 01/26/2024 17:18   DG Chest 2 View Result Date: 01/25/2024 CLINICAL DATA:  ShOB EXAM: CHEST - 2 VIEW COMPARISON:  03/17/2023 FINDINGS: Lungs are clear. Subcentimeter calcified granuloma in the posterior right lower lobe. Heart size and mediastinal contours are within normal limits. Aortic Atherosclerosis (ICD10-170.0). No effusion. Compression deformity T12 stable since 04/21/2021. IMPRESSION: No acute cardiopulmonary disease. Electronically Signed   By: JONETTA Faes M.D.   On: 01/25/2024 13:21     EKG: My personal interpretation of EKG shows: Atrial fibrillation rate controlled and prolonged QTc    Assessment/Plan: Principal Problem:   CAP (community acquired pneumonia) Active Problems:   Acute respiratory failure with hypoxia (HCC)   Acute metabolic encephalopathy   Hyperlipidemia   QT prolongation   Essential  hypertension   Paroxysmal atrial fibrillation (HCC)   Elevated troponin   Chronic idiopathic thrombocytopenia (HCC)   GERD (gastroesophageal reflux disease)   History of Parkinson disease   Elevated brain natriuretic peptide (BNP) level    Assessment and Plan: Aspiration pneumonia Sepsis secondary to pneumonia Acute hypoxic respiratory failure-secondary to aspiration pneumonia -Presented to emergency department complaining of cough, shortness of breath, confusion, generalized weakness and increasing tremor of the bilateral upper extremities which is intervening while patient was feeding herself.  Patient did not have any episodes of dysarthria or dysphagia. -In the ED patient is hypotensive, tachypneic, O2 sat dropped to 88% room air.  Chest x-ray unremarkable.  CTA chest showed bilateral lung pneumonia. - Patient meets sepsis criteria in the setting of hypotension, tachycardia and having a source of infection. - Fortunately patient is  afebrile no evidence of leukocytosis. - CT head no acute intracranial abnormality - In the ED patient has been treated with IV ceftriaxone and azithromycin . - Pending respiratory panel, blood culture, sputum culture, urine Legionella urine strep and procalcitonin level. - Pending VBG - O2 sat has been improved to 90 to 96% on nasal cannula oxygen - Continue IV ceftriaxone and doxycycline. - Continue aspiration precaution and wean down oxygen as patient tolerates. - Patient has elevated BNP which is restricting aggressive fluid resuscitation as high risk for development of volume overload.  In the ED patient has been received 250 mL of LR bolus.  Starting maintenance fluid LR 100 cc/h. -Continue supplemental oxygen and wean down to room air as patient tolerates.  Elevated BNP Elevated troponin-secondary to demand ischemia -Elevated BNP around 438.  No previous history of CHF.  Initial troponin 35 and pending second troponin.  EKG showing atrial fibrillation  rate controlled there is no evidence of ST-T wave abnormality and prolonged QTc - Concern for elevated BNP from underlying stress secondary from acute hypoxic respiratory failure.  Will obtain echocardiogram to assess the baseline heart function. - Elevated troponin in the setting of demand ischemia in the context of aspiration pneumonia - Pending second troponin level. - Continue cardiac monitoring.   Prolonged QTc - Underlying prolonged QTc in the setting of chronic Sinemet  use.  Patient has been examined for many years and per chart review previous EKG showing prolonged QTc.  At this time unable to discontinue the Sinemet  abruptly.  Need to seen by neurology clinic outpatient for dose adjustment. - Continue to monitor electrolytes while patient here in the hospital and continue cardiac monitoring.  Acute metabolic encephalopathy/confusion-resolved - Family reported today morning patient was confused and unable to feed herself with right hand due to increased tremor which is causing weakness, generalized weakness and fatigue.  Patient has underlying Parkinson disease and has tremor and bradykinesia at the baseline. -Physical exam showed bilateral upper extremity resting tremor right>left. - Concern for acute metabolic encephalopathy specifically confusion in the setting of acute hypoxic respiratory failure due to pneumonia. - CT head no acute intracranial abnormality. -At this time there is no concern for TIA given patient has generalized weakness, increased tremor in the setting of acute hypoxic respiratory failure and missing dose of Sinemet .  Physical exam did not appreciate any evidence of upper and lower extremity weakness and per family patient is at baseline. - Physician Dr. Tomma saw discussed with neurology Dr. Jerrie no concern for TIA at this time and there is no need to pursue TIA workup as well. -Continue to treat for pneumonia and hypoxic respiratory failure as mentioned  above.  Essential hypertension -Holding Lopressor  in the setting of hypotension.  Paroxysmal atrial fibrillation -Continue flecainide  and Eliquis .  Holding metoprolol  in setting of hypotension  Chronic idiopathic leukocytopenia -Stable platelet count continue to monitor.  History of Parkinson's disease - Continue Sinemet     DVT prophylaxis:  Eliquis  Code Status:  Full Code Diet: Heart healthy diet Family Communication:   Family was present at bedside, at the time of interview. Opportunity was given to ask question and all questions were answered satisfactorily.  Disposition Plan: Pending culture results. Consults: None indicated at this time. Admission status:   Inpatient, Telemetry bed  Severity of Illness: The appropriate patient status for this patient is INPATIENT. Inpatient status is judged to be reasonable and necessary in order to provide the required intensity of service to ensure the patient's safety. The patient's  presenting symptoms, physical exam findings, and initial radiographic and laboratory data in the context of their chronic comorbidities is felt to place them at high risk for further clinical deterioration. Furthermore, it is not anticipated that the patient will be medically stable for discharge from the hospital within 2 midnights of admission.   * I certify that at the point of admission it is my clinical judgment that the patient will require inpatient hospital care spanning beyond 2 midnights from the point of admission due to high intensity of service, high risk for further deterioration and high frequency of surveillance required.DEWAINE    Leory Allinson, MD [facility]  How to contact the [facility] Attending or Consulting provider 7A - 7P or covering provider during after hours 7P -7A, for this patient.  Check the care team in [facility] and look for a) attending/consulting [facility] provider listed and b) the [facility] team listed Log into [email or web address] and use [facility]'s universal password to access. If you do not have the password, please contact the hospital operator. Locate the [facility] provider you are looking for under [facility] and page to a number that you can be directly reached. If you still have difficulty reaching the provider, please page the [facility] (Director on Call) for the Hospitalists listed on amion for assistance.  01/27/2024, 12:20 AM

## 2024-01-26 NOTE — ED Notes (Signed)
 CCMD called, pt on monitor

## 2024-01-26 NOTE — ED Notes (Signed)
 Pt sleeping. Intermittent congested non-productive cough continues. SPO2 decreased/ dipping while sleeping. [place] O2 increased from 2L to 4L. Family at [facility] x2.

## 2024-01-26 NOTE — ED Provider Notes (Signed)
 [place] EMERGENCY DEPARTMENT AT [facility] Provider Note   CSN: [id number] Arrival date & time: 01/26/24  1635     Patient presents with: Shortness of Breath, Altered Mental Status, and Respiratory Distress   Cassie White is a 83 y.o. female.   HPI   83 year old female with medical history significant for HLD, GERD, mitral valve prolapse, paroxysmal atrial fibrillation on flecainide , metoprolol , Eliquis , Parkinson's disease on Sinemet  presenting to the emergency department with cough and shortness of breath.  The patient was seen in the emergency department yesterday with a cough and shortness of breath and was diagnosed with an upper respiratory infection.  The patient woke up this morning acting confused and had difficulty feeding herself, difficulty lifting her right arm up.  She did miss a few doses of her Sinemet  yesterday while she was in the emergency department and has resumed her medicine today.  Unclear if the patient was having weakness or rigidity.  Symptoms appear to have since resolved.  She has had a persistent nonproductive cough and O2 sats were notably decreased with an increased respiratory rate with EMS.  Family states that she is no longer confused and is at her baseline.  On arrival, oxygen saturations were 88% on room air, tachypneic, borderline hypotensive.  She was placed on oxygen via nasal cannula.  She denies any chest pain.  She denies any fevers, no known sick contacts.  Prior to Admission medications   Medication Sig Start Date End Date Taking? Authorizing Provider  benzonatate (TESSALON) 100 MG capsule Take 1 capsule (100 mg total) by mouth 2 (two) times daily as needed for cough. 01/25/24   Tegeler, Lonni PARAS, MD  betamethasone dipropionate 0.05 % lotion  02/26/22   [provider]  Biotin  5000 MCG CAPS Take 5,000 mcg by mouth daily.    [provider]  Calcium  Carbonate (CALCIUM  600 PO) Take 600 mg by mouth 2 (two) times  daily.     [provider]  carbidopa -levodopa  (SINEMET  CR) 50-200 MG tablet TAKE 1 TABLET BY MOUTH EVERYDAY AT BEDTIME 11/23/23   Tat, Asberry RAMAN, DO  carbidopa -levodopa  (SINEMET  IR) 25-100 MG tablet Take  2 at 8am, Take 2 at 11 am,  Take 2 at 2 pm and 1 at 5pm 04/27/23   Tat, Rebecca S, DO  cyanocobalamin  (VITAMIN B12) 1000 MCG/ML injection INJECT 1 ML (1,000 MCG) INTRAMUSCULARLY EVERY 30 DAYS 11/06/23   [name], [name], MD  ELIQUIS  5 MG TABS tablet TAKE 1 TABLET BY MOUTH TWICE A DAY 12/15/23   [name], Peter M, MD  flecainide  (TAMBOCOR ) 100 MG tablet Take 1 tablet (100 mg total) by mouth 2 (two) times daily. 12/21/23   [name], Peter M, MD  fluticasone  (FLONASE ) 50 MCG/ACT nasal spray Place 2 sprays into both nostrils daily. 09/08/22   [name], [name], MD  gabapentin  (NEURONTIN ) 300 MG capsule TAKE 2 CAPSULES BY MOUTH TWICE A DAY 09/18/23   [name], [name], MD  metoprolol  tartrate (LOPRESSOR ) 25 MG tablet Take 1 tablet (25 mg total) by mouth 2 (two) times daily. 09/18/23   [name], [name], MD  nitroGLYCERIN  (NITROSTAT ) 0.4 MG SL tablet PLACE 1 TABLET UNDER THE TONGUE EVERY 5 MINUTES AS NEEDED FOR CHEST PAIN. 12/18/22   [name], [name], MD  Omega-3 Fatty Acids (FISH OIL ) 1200 MG CAPS Take 1,000 mg by mouth 2 (two) times daily.     [provider]  pravastatin  (PRAVACHOL ) 40 MG tablet TAKE 1 TABLET BY MOUTH EVERY  DAY 12/24/23   [name], Peter M, MD  raloxifene  (EVISTA ) 60 MG tablet TAKE 1 TABLET BY MOUTH EVERY DAY 12/25/23   [name], [name], MD  traZODone  (DESYREL ) 100 MG tablet TAKE 1 TABLET BY MOUTH EVERYDAY AT BEDTIME 11/23/23   Tat, Asberry RAMAN, DO    Allergies: Sulfa antibiotics and Tizanidine     Review of Systems  All other systems reviewed and are negative.   Updated Vital Signs BP (!) 110/42   Pulse 84   Temp 98.4 F (36.9 C) (Oral)   Resp (!) 23   Ht 5' 1 (1.549 m)   Wt 60.8 kg   SpO2 96%   BMI 25.33 kg/m   Physical Exam Vitals and nursing note reviewed.   Constitutional:      General: She is not in acute distress.    Appearance: She is well-developed.  HENT:     Head: Normocephalic and atraumatic.   Eyes:     Conjunctiva/sclera: Conjunctivae normal.    Cardiovascular:     Rate and Rhythm: Normal rate. Rhythm irregular.     Pulses: Normal pulses.  Pulmonary:     Effort: Pulmonary effort is normal. Tachypnea present. No respiratory distress.     Breath sounds: Rhonchi present.  Abdominal:     Palpations: Abdomen is soft.     Tenderness: There is no abdominal tenderness.   Musculoskeletal:        General: No swelling.     Cervical back: Neck supple.     Right lower leg: No edema.     Left lower leg: No edema.   Skin:    General: Skin is warm and dry.     Capillary Refill: Capillary refill takes less than 2 seconds.   Neurological:     Mental Status: She is alert.     Comments: MENTAL STATUS EXAM:    Orientation: Alert and oriented to person, place and time.  Memory: Cooperative, follows commands well.  Language: Speech is clear and language is normal.   CRANIAL NERVES:    CN 2 (Optic): Visual fields intact to confrontation.  CN 3,4,6 (EOM): Pupils equal and reactive to light. Full extraocular eye movement without nystagmus.  CN 5 (Trigeminal): Facial sensation is normal, no weakness of masticatory muscles.  CN 7 (Facial): No facial weakness or asymmetry.  CN 8 (Auditory): Auditory acuity grossly normal.  CN 9,10 (Glossophar): The uvula is midline, the palate elevates symmetrically.  CN 11 (spinal access): Normal sternocleidomastoid and trapezius strength.  CN 12 (Hypoglossal): The tongue is midline. No atrophy or fasciculations.Cassie White   MOTOR:  Muscle Strength: 5/5RUE, 5/5LUE, 5/5RLE, 5/5LLE.   COORDINATION:   Intact finger-to-nose, no tremor.   SENSATION:   Intact to light touch all four extremities.    Psychiatric:        Mood and Affect: Mood normal.     (all labs ordered are listed, but only abnormal results  are displayed) Labs Reviewed  BASIC METABOLIC PANEL WITH GFR - Abnormal; Notable for the following components:      Result Value   CO2 21 (*)    Glucose, Bld 129 (*)    Calcium  8.6 (*)    All other components within normal limits  CBC WITH DIFFERENTIAL/PLATELET - Abnormal; Notable for the following components:   Platelets 101 (*)    All other components within normal limits  BRAIN NATRIURETIC PEPTIDE - Abnormal; Notable for the following components:   B Natriuretic Peptide 338.0 (*)    All other  components within normal limits  URINALYSIS, ROUTINE W REFLEX MICROSCOPIC - Abnormal; Notable for the following components:   APPearance HAZY (*)    Ketones, ur 5 (*)    Leukocytes,Ua TRACE (*)    Bacteria, UA MANY (*)    All other components within normal limits  CBG MONITORING, ED - Abnormal; Notable for the following components:   Glucose-Capillary 151 (*)    All other components within normal limits  TROPONIN I (HIGH SENSITIVITY) - Abnormal; Notable for the following components:   Troponin I (High Sensitivity) 25 (*)    All other components within normal limits  TROPONIN I (HIGH SENSITIVITY) - Abnormal; Notable for the following components:   Troponin I (High Sensitivity) 25 (*)    All other components within normal limits  RESPIRATORY PANEL BY PCR  CULTURE, BLOOD (ROUTINE X 2)  CULTURE, BLOOD (ROUTINE X 2)  EXPECTORATED SPUTUM ASSESSMENT W GRAM STAIN, RFLX TO RESP C  RAPID URINE DRUG SCREEN, HOSP PERFORMED  PROCALCITONIN  STREP PNEUMONIAE URINARY ANTIGEN  LEGIONELLA PNEUMOPHILA SEROGP 1 UR AG  CBC  BASIC METABOLIC PANEL WITH GFR  I-STAT VENOUS BLOOD GAS, ED    EKG: EKG Interpretation Date/Time:  Tuesday January 26 2024 16:41:48 EDT Ventricular Rate:  90 PR Interval:    QRS Duration:  143 QT Interval:  411 QTC Calculation: 503 R Axis:   -41  Text Interpretation: Atrial fibrillation Left bundle branch block Artifact in lead(s) I II aVR aVL Confirmed by Jerrol Agent ([id number])  on 01/26/2024 4:56:06 PM  Radiology: CT HEAD WO CONTRAST Result Date: 01/26/2024 CLINICAL DATA:  Mental status change EXAM: CT HEAD WITHOUT CONTRAST TECHNIQUE: Contiguous axial images were obtained from the base of the skull through the vertex without intravenous contrast. RADIATION DOSE REDUCTION: This exam was performed according to the departmental dose-optimization program which includes automated exposure control, adjustment of the mA and/or kV according to patient size and/or use of iterative reconstruction technique. COMPARISON:  CT head 03/17/2023. FINDINGS: Brain: No acute intracranial hemorrhage. No CT evidence of acute infarct. Nonspecific hypoattenuation in the periventricular and subcortical white matter favored to reflect chronic microvascular ischemic changes. No edema, mass effect, or midline shift. The basilar cisterns are patent. Ventricles: Prominence of the ventricles suggesting underlying parenchymal volume loss. Vascular: Atherosclerotic calcifications of the carotid siphons and intracranial vertebral arteries. No hyperdense vessel. Skull: No acute or aggressive finding. Orbits: Bilateral lens replacement. Orbits are otherwise unremarkable. Sinuses: Mucosal thickening throughout the paranasal sinuses with air-fluid levels in the bilateral maxillary sinuses. Other: Mastoid air cells are clear. IMPRESSION: No CT evidence of acute intracranial abnormality. Paranasal sinus disease with air-fluid levels in the bilateral maxillary sinuses. Recommend clinical correlation for acute sinusitis. Chronic microvascular ischemic changes and mild parenchymal volume loss. Electronically Signed   By: Donnice Mania M.D.   On: 01/26/2024 19:28   CT Angio Chest PE W and/or Wo Contrast Result Date: 01/26/2024 CLINICAL DATA:  Pulmonary embolism (PE) suspected, high prob EXAM: CT ANGIOGRAPHY CHEST WITH CONTRAST TECHNIQUE: Multidetector CT imaging of the chest was performed using the standard protocol during  bolus administration of intravenous contrast. Multiplanar CT image reconstructions and MIPs were obtained to evaluate the vascular anatomy. RADIATION DOSE REDUCTION: This exam was performed according to the departmental dose-optimization program which includes automated exposure control, adjustment of the mA and/or kV according to patient size and/or use of iterative reconstruction technique. CONTRAST:  75mL OMNIPAQUE IOHEXOL 350 MG/ML SOLN COMPARISON:  None Available. FINDINGS: Cardiovascular: No filling defects in the  pulmonary arteries to suggest pulmonary emboli. Heart is normal size. Aorta is normal caliber. Moderate aortic atherosclerosis. Scattered coronary artery calcifications. Mediastinum/Nodes: No mediastinal, hilar, or axillary adenopathy. Trachea and esophagus are unremarkable. Thyroid  unremarkable. Lungs/Pleura: Nodular airspace opacity noted in the lingula as well as bilateral lower lobes. This likely reflects multifocal pneumonia. Bibasilar atelectasis. No effusions. Upper Abdomen: No acute findings Musculoskeletal: Chest wall soft tissues are unremarkable. No acute bony abnormality. Review of the MIP images confirms the above findings. IMPRESSION: Nodular airspace disease noted in the lingula and both lower lobes, right greater than left concerning for pneumonia. No evidence of pulmonary embolus. Aortic Atherosclerosis (ICD10-I70.0). Electronically Signed   By: Franky Crease M.D.   On: 01/26/2024 19:26   DG Chest Port 1 View Result Date: 01/26/2024 CLINICAL DATA:  Shortness of breath EXAM: PORTABLE CHEST 1 VIEW COMPARISON:  Chest x-ray 01/25/2024 FINDINGS: The heart size and mediastinal contours are within normal limits. Both lungs are clear. Right shoulder arthroplasty is present. IMPRESSION: No active disease. Electronically Signed   By: Greig Pique M.D.   On: 01/26/2024 17:18   DG Chest 2 View Result Date: 01/25/2024 CLINICAL DATA:  ShOB EXAM: CHEST - 2 VIEW COMPARISON:  03/17/2023  FINDINGS: Lungs are clear. Subcentimeter calcified granuloma in the posterior right lower lobe. Heart size and mediastinal contours are within normal limits. Aortic Atherosclerosis (ICD10-170.0). No effusion. Compression deformity T12 stable since 04/21/2021. IMPRESSION: No acute cardiopulmonary disease. Electronically Signed   By: JONETTA Faes M.D.   On: 01/25/2024 13:21     .Critical Care  Performed by: Jerrol Agent, MD Authorized by: Jerrol Agent, MD   Critical care provider statement:    Critical care time (minutes):  30   Critical care was necessary to treat or prevent imminent or life-threatening deterioration of the following conditions:  Respiratory failure   Critical care was time spent personally by me on the following activities:  Development of treatment plan with patient or surrogate, discussions with consultants, evaluation of patient's response to treatment, examination of patient, ordering and review of laboratory studies, ordering and review of radiographic studies, ordering and performing treatments and interventions, pulse oximetry, re-evaluation of patient's condition and review of old charts   Care discussed with: admitting provider      Medications Ordered in the ED  carbidopa -levodopa  (SINEMET  CR) 50-200 MG per tablet controlled release 1 tablet (has no administration in time range)  carbidopa -levodopa  (SINEMET  IR) 25-100 MG per tablet immediate release 1 tablet (has no administration in time range)  azithromycin  (ZITHROMAX ) 500 mg in sodium chloride  0.9 % 250 mL IVPB (has no administration in time range)  flecainide  (TAMBOCOR ) tablet 100 mg (has no administration in time range)  pravastatin  (PRAVACHOL ) tablet 40 mg (40 mg Oral Not Given 01/26/24 2033)  traZODone  (DESYREL ) tablet 100 mg (has no administration in time range)  apixaban  (ELIQUIS ) tablet 5 mg (has no administration in time range)  lactated ringers  infusion ( Intravenous New Bag/Given 01/26/24 2041)  sodium  chloride flush (NS) 0.9 % injection 3 mL (has no administration in time range)  sodium chloride  flush (NS) 0.9 % injection 3 mL (has no administration in time range)  sodium chloride  flush (NS) 0.9 % injection 3 mL (has no administration in time range)  0.9 %  sodium chloride  infusion (has no administration in time range)  acetaminophen  (TYLENOL ) tablet 650 mg (has no administration in time range)    Or  acetaminophen  (TYLENOL ) suppository 650 mg (has no administration in time range)  ondansetron  (ZOFRAN ) tablet 4 mg (has no administration in time range)    Or  ondansetron  (ZOFRAN ) injection 4 mg (has no administration in time range)  ondansetron  (ZOFRAN ) injection 4 mg (has no administration in time range)  guaiFENesin (MUCINEX) 12 hr tablet 600 mg (has no administration in time range)  ipratropium-albuterol (DUONEB) 0.5-2.5 (3) MG/3ML nebulizer solution 3 mL (has no administration in time range)  Ampicillin-Sulbactam (UNASYN) 3 g in sodium chloride  0.9 % 100 mL IVPB (has no administration in time range)  lactated ringers  bolus 250 mL (0 mLs Intravenous Stopped 01/26/24 2041)  iohexol (OMNIPAQUE) 350 MG/ML injection 75 mL (75 mLs Intravenous Contrast Given 01/26/24 1922)  cefTRIAXone (ROCEPHIN) 1 g in sodium chloride  0.9 % 100 mL IVPB (1 g Intravenous New Bag/Given 01/26/24 2015)                                    Medical Decision Making Amount and/or Complexity of Data Reviewed Labs: ordered. Radiology: ordered.  Risk Prescription drug management. Decision regarding hospitalization.     83 year old female with medical history significant for HLD, GERD, mitral valve prolapse, paroxysmal atrial fibrillation on flecainide , metoprolol , Eliquis , Parkinson's disease on Sinemet  presenting to the emergency department with cough and shortness of breath.  The patient was seen in the emergency department yesterday with a cough and shortness of breath and was diagnosed with an upper respiratory  infection.  The patient woke up this morning acting confused and had difficulty feeding herself, difficulty lifting her right arm up.  She did miss a few doses of her Sinemet  yesterday while she was in the emergency department and has resumed her medicine today.  Unclear if the patient was having weakness or rigidity.  Symptoms appear to have since resolved.  She has had a persistent nonproductive cough and O2 sats were notably decreased with an increased respiratory rate with EMS.  Family states that she is no longer confused and is at her baseline.  On arrival, oxygen saturations were 88% on room air, tachypneic, borderline hypotensive.  She was placed on oxygen via nasal cannula.  She denies any chest pain.  She denies any fevers, no known sick contacts.  On arrival, the patient was afebrile, heart rate 94, irregularly irregular noted on cardiac telemetry and on exam.  Patient presenting with acute hypoxic respiratory failure requiring oxygen supplementation by nasal cannula, tachypnea to the 30s.  Concern for viral infectious etiology, pneumonia, pneumothorax, PE.  Patient without chest pain, lower concern for ACS, aortic dissection.  Initial EKG revealed atrial fibrillation, ventricular rate 9 0, left bundle branch block present, no STEMI.  Artifact present limited interpretation.  X-ray was negative for acute cardiac or pulmonary disease.  Laboratory evaluation was significant for BNP elevated to 338, initial cardiac troponin 25, repeat flat at 25, CBC without a leukocytosis or anemia, BMP generally unremarkable with only mild non-anion gap acidosis with a bicarbonate of 21, anion gap of 15, urinalysis with many bacteria present, 6-10 WBCs.  Blood culture collected and pending.  UDS negative, RVP was collected as COVID and flu and RSV PCR testing was negative yesterday in the ER.  Patient without strokelike symptoms, CT of the head was generally unremarkable for acute etiology, did discuss with Dr.  Jerrie of neurology who felt that in the setting of the patient's hypoxia and other presentation, TIA would be less likely at this time and did not recommend  further workup with MRI.  CT Head: IMPRESSION:  No CT evidence of acute intracranial abnormality.    Paranasal sinus disease with air-fluid levels in the bilateral  maxillary sinuses. Recommend clinical correlation for acute  sinusitis.    Chronic microvascular ischemic changes and mild parenchymal volume  loss.    CTA PE: IMPRESSION:  Nodular airspace disease noted in the lingula and both lower lobes,  right greater than left concerning for pneumonia.    No evidence of pulmonary embolus.    Aortic Atherosclerosis (ICD10-I70.0).    CTA PE study revealed evidence of pneumonia and the patient was started on broad-spectrum antibiotics.  In the setting of the patient's hypoxic respiratory failure requiring oxygen supplementation, hospitalist medicine was consulted for admission, Dr. Lavetta accepting.      Final diagnoses:  Community acquired pneumonia, unspecified laterality  Acute respiratory failure with hypoxia (HCC)  Acute cystitis without hematuria    ED Discharge Orders     None          Jerrol Agent, MD 01/26/24 2114

## 2024-01-26 NOTE — Plan of Care (Signed)
 Discussed briefly with Dr. Jerrol.  Patient with Parkinson's disease who presented yesterday for cough and while in the ED missed some of her home Sinemet .  Workup was overall reassuring in the ED and she was discharged but this morning was more confused and had trouble feeding herself with her right hand.  She is being admitted for hypoxic respiratory failure with new oxygen requirement  Unclear from the description if this was truly unilateral weakness or global weakness; more global weakness/confusion would be favored to be secondary to her respiratory status and would not indicate need for TIA workup.  If the patient was simply globally weak in the setting of hypoxia would not pursue significant stroke/TIA workup, however if there was clear focal weakness with only the right side being weak while her left side remained normal strength, this would be more concerning for possible TIA and I would be happy to do a full consultation  Dr. Jerrol will clarify and let me know if a full neurological consultation is indicated  Per chart review from her last neurology note her carbidopa /levodopa  dosing is as follows and should be continued.              -Continue carbidopa /levodopa  to 2 at 8am, 11am, 2pm and 1 at 5pm.               -continue carbidopa /levodopa  50/200 CR at bed

## 2024-01-26 NOTE — ED Triage Notes (Signed)
 BIB [facility] for AMS and SOB. Seen yesterday at [facility], dx with URI. Family states she woke up this morning acting confused and could not feed herself. VSS. RR elevated 35

## 2024-01-27 ENCOUNTER — Other Ambulatory Visit: Payer: Self-pay

## 2024-01-27 ENCOUNTER — Inpatient Hospital Stay (HOSPITAL_COMMUNITY)

## 2024-01-27 DIAGNOSIS — R7989 Other specified abnormal findings of blood chemistry: Secondary | ICD-10-CM | POA: Diagnosis not present

## 2024-01-27 DIAGNOSIS — R0602 Shortness of breath: Secondary | ICD-10-CM | POA: Diagnosis not present

## 2024-01-27 DIAGNOSIS — J189 Pneumonia, unspecified organism: Secondary | ICD-10-CM | POA: Diagnosis not present

## 2024-01-27 LAB — EXPECTORATED SPUTUM ASSESSMENT W GRAM STAIN, RFLX TO RESP C

## 2024-01-27 LAB — ECHOCARDIOGRAM COMPLETE
Area-P 1/2: 3.72 cm2
Height: 61 in
S' Lateral: 2.9 cm
Weight: 2264.57 [oz_av]

## 2024-01-27 LAB — BASIC METABOLIC PANEL WITH GFR
Anion gap: 10 (ref 5–15)
BUN: 11 mg/dL (ref 8–23)
CO2: 23 mmol/L (ref 22–32)
Calcium: 8.7 mg/dL — ABNORMAL LOW (ref 8.9–10.3)
Chloride: 103 mmol/L (ref 98–111)
Creatinine, Ser: 0.79 mg/dL (ref 0.44–1.00)
GFR, Estimated: >60 mL/min (ref >60)
Glucose, Bld: 195 mg/dL — ABNORMAL HIGH (ref 70–99)
Potassium: 4.2 mmol/L (ref 3.5–5.1)
Sodium: 136 mmol/L (ref 135–145)

## 2024-01-27 LAB — CBC
HCT: 37.4 % (ref 36.0–46.0)
Hemoglobin: 12.4 g/dL (ref 12.0–15.0)
MCH: 31.2 pg (ref 26.0–34.0)
MCHC: 33.2 g/dL (ref 30.0–36.0)
MCV: 94.2 fL (ref 80.0–100.0)
Platelets: 107 10*3/uL — ABNORMAL LOW (ref 150–400)
RBC: 3.97 MIL/uL (ref 3.87–5.11)
RDW: 13.2 % (ref 11.5–15.5)
WBC: 4.3 10*3/uL (ref 4.0–10.5)
nRBC: 0 % (ref 0.0–0.2)

## 2024-01-27 LAB — MRSA NEXT GEN BY PCR, NASAL: MRSA by PCR Next Gen: NOT DETECTED

## 2024-01-27 NOTE — Plan of Care (Signed)
   Problem: Education: Goal: Knowledge of General Education information will improve Description: Including pain rating scale, medication(s)/side effects and non-pharmacologic comfort measures Outcome: Progressing   Problem: Nutrition: Goal: Adequate nutrition will be maintained Outcome: Progressing   Problem: Coping: Goal: Level of anxiety will decrease Outcome: Progressing

## 2024-01-27 NOTE — Progress Notes (Signed)
 PROGRESS NOTE    Cassie White  FMW:[id number] DOB: [birth year] DOA: 01/26/2024 PCP: Candise [name], MD   Brief Narrative:  This 83 yrs old female with PMH significant of paroxysmal atrial fibrillation, mitral regurgitation, hyperlipidemia, GERD, osteoporosis, essential hypertension, chronic thrombocytopenia, GERD, and Parkinson disease presented to the ED with confusion this morning, cough and shortness of breath for last few days.  Family also noticed right-sided weakness, Patient unable to feed herself and has missed doses of Sinemet  for last few days.  She was hypoxic with SpO2 of 88% on room air in the ED requiring 3.5 L of supplemental oxygen to maintain saturation above 93%.  CTA chest shows no evidence of PE but shows nodular airspace disease concerning for pneumonia. Family reports patient initially had left-sided upper extremity weakness which resolved in the ED.  EDP initially approached neurologist Dr. Jerrie who states symptoms are not related to TIA,  there is no need to pursue TIA workup.  Patient was admitted for acute hypoxic respiratory failure secondary to community-acquired pneumonia and started on empiric antibiotics.  Assessment & Plan:   Principal Problem:   CAP (community acquired pneumonia) Active Problems:   Acute respiratory failure with hypoxia (HCC)   Acute metabolic encephalopathy   Hyperlipidemia   QT prolongation   Essential hypertension   Paroxysmal atrial fibrillation (HCC)   Elevated troponin   Chronic idiopathic thrombocytopenia (HCC)   GERD (gastroesophageal reflux disease)   History of Parkinson disease   Elevated brain natriuretic peptide (BNP) level   Acute hypoxic respiratory failure: Sepsis secondary to pneumonia,  Concern for aspiration pneumonia: Patient presented in the ED with cough, shortness of breath, confusion, generalized weakness and increasing tremor of both upper extremities. She was hypotensive, tachycardic, tachypneic and   hypoxic in the ED Chest x-ray unremarkable.  CTA chest showed bilateral lung pneumonia. Patient meets sepsis criteria  (hypotension, tachycardia and having a source of infection). CT head > No acute intracranial abnormality Initiated on empiric antibiotic IV ceftriaxone and azithromycin . RVP shows metapneumovirus+, follow-up urine Legionella, sputum culture.  Blood culture Continue supplemental oxygen and wean as tolerated. Continue ceftriaxone and doxycycline. Discontinue droplet precautions.   Elevated BNP: Elevated troponin-secondary to demand ischemia: Elevated BNP around 438.  No previous history of CHF.   Troponin 25 > 25 flat.  EKG showing atrial fibrillation rate controlled.  Obtain 2D echocardiogram.   Prolonged Qtc: Underlying prolonged QTc in the setting of chronic Sinemet  use.   At this time unable to discontinue the Sinemet  abruptly.   Need to seen by neurology clinic outpatient for dose adjustment. Continue to monitor electrolytes while patient here in the hospital and continue cardiac monitoring.   Acute metabolic encephalopathy / Confusion - resolved. Family reports that patient was confused and unable to feed herself with right hand due to increased tremor which is causing weakness, generalized weakness and fatigue.  Patient has underlying Parkinson disease and has tremor and bradykinesia at the baseline. Concern for acute metabolic encephalopathy specifically confusion in the setting of acute hypoxic respiratory failure due to pneumonia. CT head > No acute intracranial abnormality. At this time there is no concern for TIA given patient has generalized weakness, increased tremor in the setting of acute hypoxic respiratory failure and missing dose of Sinemet .  Physical exam did not appreciate any evidence of upper and lower extremity weakness and per family patient is at baseline. Dr. Tomma saw discussed with neurology Dr. Jerrie  >No concern for TIA at this time and  there is no need to pursue TIA workup as well. Continue to treat for pneumonia and hypoxic respiratory failure as mentioned above.   Essential hypertension Holding Lopressor  in the setting of hypotension.   Paroxysmal atrial fibrillation: Continue flecainide  and Eliquis .   Holding metoprolol  in setting of hypotension   Chronic idiopathic leukocytopenia: Stable platelet count continue to monitor.   History of Parkinson's disease: Continue Sinemet .     DVT prophylaxis: Eliquis  Code Status: Full code Family Communication: No family at bed side Disposition Plan:    Status is: Inpatient Remains inpatient appropriate because: Admitted for Acute hypoxic respiratory failure, secondary to community acquired PNA.    Consultants:  Neurologist  Procedures: CTA Chest  Antimicrobials:  Anti-infectives (From admission, onward)    Start     Dose/Rate Route Frequency Ordered Stop   01/27/24 2200  doxycycline (VIBRAMYCIN) 100 mg in sodium chloride  0.9 % 250 mL IVPB        100 mg 125 mL/hr over 120 Minutes Intravenous Every 12 hours 01/26/24 2202 02/03/24 2159   01/27/24 0800  cefTRIAXone (ROCEPHIN) 2 g in sodium chloride  0.9 % 100 mL IVPB        2 g 200 mL/hr over 30 Minutes Intravenous Every 24 hours 01/26/24 2202 02/02/24 0759   01/27/24 0000  Ampicillin-Sulbactam (UNASYN) 3 g in sodium chloride  0.9 % 100 mL IVPB  Status:  Discontinued        3 g 200 mL/hr over 30 Minutes Intravenous Every 6 hours 01/26/24 2034 01/26/24 2200   01/26/24 2000  cefTRIAXone (ROCEPHIN) 1 g in sodium chloride  0.9 % 100 mL IVPB        1 g 200 mL/hr over 30 Minutes Intravenous  Once 01/26/24 1948 01/26/24 2145   01/26/24 2000  azithromycin  (ZITHROMAX ) 500 mg in sodium chloride  0.9 % 250 mL IVPB        500 mg 250 mL/hr over 60 Minutes Intravenous  Once 01/26/24 1948 01/26/24 2311      Subjective: Patient was seen and examined at bedside.Overnight events noted. Patient remains on 3.5 L of supplemental  oxygen.  At baseline she does not use oxygen.   Daughter is at bedside.  Patient is back to her baseline mental status.  Objective: Vitals:   01/26/24 2200 01/26/24 2300 01/27/24 0412 01/27/24 0737  BP: (!) 129/54 (!) 128/56 (!) 133/94 134/77  Pulse: 79 73 73   Resp: (!) 25 20 16 19   Temp:  97.8 F (36.6 C) 97.6 F (36.4 C) 98 F (36.7 C)  TempSrc:  Oral Oral Oral  SpO2: 94% 93% 97%   Weight:  64.2 kg    Height:  5' 1 (1.549 m)      Intake/Output Summary (Last 24 hours) at 01/27/2024 1051 Last data filed at 01/27/2024 1024 Gross per 24 hour  Intake 123 ml  Output 1100 ml  Net -977 ml   Filed Weights   01/26/24 1703 01/26/24 2300  Weight: 60.8 kg 64.2 kg    Examination:  General exam: Appears calm and comfortable, deconditioned, not in any acute distress. Respiratory system: Clear to auscultation. Respiratory effort normal.  RR 16 Cardiovascular system: S1 & S2 heard, RRR. No JVD, murmurs, rubs, gallops or clicks.  Gastrointestinal system: Abdomen is non distended, soft and non tender. Normal bowel sounds heard. Central nervous system: Alert and oriented X 3. No focal neurological deficits. Extremities: No Edema, no cyanosis, no clubbing. Skin: No rashes, lesions or ulcers Psychiatry: Judgement and insight appear normal. Mood &  affect appropriate.     Data Reviewed: I have personally reviewed following labs and imaging studies  CBC: Recent Labs  Lab 01/26/24 1645 01/27/24 0241  WBC 6.0 4.3  NEUTROABS 3.7  --   HGB 12.5 12.4  HCT 38.7 37.4  MCV 96.5 94.2  PLT 101* 107*   Basic Metabolic Panel: Recent Labs  Lab 01/26/24 1645 01/27/24 0241  NA 137 136  K 3.8 4.2  CL 101 103  CO2 21* 23  GLUCOSE 129* 195*  BUN 12 11  CREATININE 0.76 0.79  CALCIUM  8.6* 8.7*   GFR: Estimated Creatinine Clearance: 46.6 mL/min (by C-G formula based on SCr of 0.79 mg/dL). Liver Function Tests: No results for input(s): AST, ALT, ALKPHOS, BILITOT, PROT,  ALBUMIN in the last 168 hours. No results for input(s): LIPASE, AMYLASE in the last 168 hours. No results for input(s): AMMONIA in the last 168 hours. Coagulation Profile: No results for input(s): INR, PROTIME in the last 168 hours. Cardiac Enzymes: No results for input(s): CKTOTAL, CKMB, CKMBINDEX, TROPONINI in the last 168 hours. BNP (last 3 results) No results for input(s): PROBNP in the last 8760 hours. HbA1C: No results for input(s): HGBA1C in the last 72 hours. CBG: Recent Labs  Lab 01/26/24 1816  GLUCAP 151*   Lipid Profile: No results for input(s): CHOL, HDL, LDLCALC, TRIG, CHOLHDL, LDLDIRECT in the last 72 hours. Thyroid  Function Tests: No results for input(s): TSH, T4TOTAL, FREET4, T3FREE, THYROIDAB in the last 72 hours. Anemia Panel: No results for input(s): VITAMINB12, FOLATE, FERRITIN, TIBC, IRON, RETICCTPCT in the last 72 hours. Sepsis Labs: Recent Labs  Lab 01/26/24 1818  PROCALCITON <0.10    Recent Results (from the past 240 hours)  Resp panel by RT-PCR (RSV, Flu A&B, Covid) Anterior Nasal Swab     Status: None   Collection Time: 01/25/24 11:34 AM   Specimen: Anterior Nasal Swab  Result Value Ref Range Status   SARS Coronavirus 2 by RT PCR NEGATIVE NEGATIVE Final    Comment: (NOTE) SARS-CoV-2 target nucleic acids are NOT DETECTED.  The SARS-CoV-2 RNA is generally detectable in upper respiratory specimens during the acute phase of infection. The lowest concentration of SARS-CoV-2 viral copies this assay can detect is 138 copies/mL. A negative result does not preclude SARS-Cov-2 infection and should not be used as the sole basis for treatment or other patient management decisions. A negative result may occur with  improper specimen collection/handling, submission of specimen other than nasopharyngeal swab, presence of viral mutation(s) within the areas targeted by this assay, and inadequate number  of viral copies(<138 copies/mL). A negative result must be combined with clinical observations, patient history, and epidemiological information. The expected result is Negative.  Fact Sheet for Patients:  BloggerCourse.com  Fact Sheet for Healthcare Providers:  SeriousBroker.it  This test is no t yet approved or cleared by the [place]  FDA and  has been authorized for detection and/or diagnosis of SARS-CoV-2 by FDA under an Emergency Use Authorization (EUA). This EUA will remain  in effect (meaning this test can be used) for the duration of the COVID-19 declaration under Section 564(b)(1) of the Act, 21 U.S.C.section 360bbb-3(b)(1), unless the authorization is terminated  or revoked sooner.       Influenza A by PCR NEGATIVE NEGATIVE Final   Influenza B by PCR NEGATIVE NEGATIVE Final    Comment: (NOTE) The Xpert Xpress SARS-CoV-2/FLU/RSV plus assay is intended as an aid in the diagnosis of influenza from Nasopharyngeal swab specimens and should not be  used as a sole basis for treatment. Nasal washings and aspirates are unacceptable for Xpert Xpress SARS-CoV-2/FLU/RSV testing.  Fact Sheet for Patients: BloggerCourse.com  Fact Sheet for Healthcare Providers: SeriousBroker.it  This test is not yet approved or cleared by the [place]  FDA and has been authorized for detection and/or diagnosis of SARS-CoV-2 by FDA under an Emergency Use Authorization (EUA). This EUA will remain in effect (meaning this test can be used) for the duration of the COVID-19 declaration under Section 564(b)(1) of the Act, 21 U.S.C. section 360bbb-3(b)(1), unless the authorization is terminated or revoked.     Resp Syncytial Virus by PCR NEGATIVE NEGATIVE Final    Comment: (NOTE) Fact Sheet for Patients: BloggerCourse.com  Fact Sheet for Healthcare  Providers: SeriousBroker.it  This test is not yet approved or cleared by the [place]  FDA and has been authorized for detection and/or diagnosis of SARS-CoV-2 by FDA under an Emergency Use Authorization (EUA). This EUA will remain in effect (meaning this test can be used) for the duration of the COVID-19 declaration under Section 564(b)(1) of the Act, 21 U.S.C. section 360bbb-3(b)(1), unless the authorization is terminated or revoked.  Performed at [facility], [address] [place], [place] [address]   Respiratory (~20 pathogens) panel by PCR     Status: Abnormal   Collection Time: 01/26/24  8:45 PM   Specimen: Nasopharyngeal Swab; Respiratory  Result Value Ref Range Status   Adenovirus NOT DETECTED NOT DETECTED Final   Coronavirus 229E NOT DETECTED NOT DETECTED Final    Comment: (NOTE) The Coronavirus on the Respiratory Panel, DOES NOT test for the novel  Coronavirus (2019 nCoV)    Coronavirus HKU1 NOT DETECTED NOT DETECTED Final   Coronavirus NL63 NOT DETECTED NOT DETECTED Final   Coronavirus OC43 NOT DETECTED NOT DETECTED Final   Metapneumovirus DETECTED (A) NOT DETECTED Final   Rhinovirus / Enterovirus NOT DETECTED NOT DETECTED Final   Influenza A NOT DETECTED NOT DETECTED Final   Influenza B NOT DETECTED NOT DETECTED Final   Parainfluenza Virus 1 NOT DETECTED NOT DETECTED Final   Parainfluenza Virus 2 NOT DETECTED NOT DETECTED Final   Parainfluenza Virus 3 NOT DETECTED NOT DETECTED Final   Parainfluenza Virus 4 NOT DETECTED NOT DETECTED Final   Respiratory Syncytial Virus NOT DETECTED NOT DETECTED Final   Bordetella pertussis NOT DETECTED NOT DETECTED Final   Bordetella Parapertussis NOT DETECTED NOT DETECTED Final   Chlamydophila pneumoniae NOT DETECTED NOT DETECTED Final   Mycoplasma pneumoniae NOT DETECTED NOT DETECTED Final    Comment: Performed at [facility], [address], [place], [place] [address]  Culture,  blood (routine x 2) Call MD if unable to obtain prior to antibiotics being given     Status: None (Preliminary result)   Collection Time: 01/26/24  8:55 PM   Specimen: BLOOD  Result Value Ref Range Status   Specimen Description BLOOD RIGHT ANTECUBITAL  Final   Special Requests   Final    BOTTLES DRAWN AEROBIC AND ANAEROBIC Blood Culture adequate volume   Culture   Final    NO GROWTH < 12 HOURS Performed at [facility], [address], [place], [place] [address]    Report Status PENDING  Incomplete  MRSA Next Gen by PCR, Nasal     Status: None   Collection Time: 01/26/24 11:05 PM   Specimen: Nasal Mucosa; Nasal Swab  Result Value Ref Range Status   MRSA by PCR Next Gen NOT DETECTED  NOT DETECTED Final    Comment: (NOTE) The GeneXpert MRSA Assay (FDA approved for NASAL specimens only), is one component of a comprehensive MRSA colonization surveillance program. It is not intended to diagnose MRSA infection nor to guide or monitor treatment for MRSA infections. Test performance is not FDA approved in patients less than 15 years old. Performed at [facility], [address] [place], [place] [address]     Radiology Studies: CT HEAD WO CONTRAST Result Date: 01/26/2024 CLINICAL DATA:  Mental status change EXAM: CT HEAD WITHOUT CONTRAST TECHNIQUE: Contiguous axial images were obtained from the base of the skull through the vertex without intravenous contrast. RADIATION DOSE REDUCTION: This exam was performed according to the departmental dose-optimization program which includes automated exposure control, adjustment of the mA and/or kV according to patient size and/or use of iterative reconstruction technique. COMPARISON:  CT head 03/17/2023. FINDINGS: Brain: No acute intracranial hemorrhage. No CT evidence of acute infarct. Nonspecific hypoattenuation in the periventricular and subcortical white matter favored to reflect chronic microvascular ischemic changes. No edema, mass  effect, or midline shift. The basilar cisterns are patent. Ventricles: Prominence of the ventricles suggesting underlying parenchymal volume loss. Vascular: Atherosclerotic calcifications of the carotid siphons and intracranial vertebral arteries. No hyperdense vessel. Skull: No acute or aggressive finding. Orbits: Bilateral lens replacement. Orbits are otherwise unremarkable. Sinuses: Mucosal thickening throughout the paranasal sinuses with air-fluid levels in the bilateral maxillary sinuses. Other: Mastoid air cells are clear. IMPRESSION: No CT evidence of acute intracranial abnormality. Paranasal sinus disease with air-fluid levels in the bilateral maxillary sinuses. Recommend clinical correlation for acute sinusitis. Chronic microvascular ischemic changes and mild parenchymal volume loss. Electronically Signed   By: Donnice Mania M.D.   On: 01/26/2024 19:28   CT Angio Chest PE W and/or Wo Contrast Result Date: 01/26/2024 CLINICAL DATA:  Pulmonary embolism (PE) suspected, high prob EXAM: CT ANGIOGRAPHY CHEST WITH CONTRAST TECHNIQUE: Multidetector CT imaging of the chest was performed using the standard protocol during bolus administration of intravenous contrast. Multiplanar CT image reconstructions and MIPs were obtained to evaluate the vascular anatomy. RADIATION DOSE REDUCTION: This exam was performed according to the departmental dose-optimization program which includes automated exposure control, adjustment of the mA and/or kV according to patient size and/or use of iterative reconstruction technique. CONTRAST:  75mL OMNIPAQUE IOHEXOL 350 MG/ML SOLN COMPARISON:  None Available. FINDINGS: Cardiovascular: No filling defects in the pulmonary arteries to suggest pulmonary emboli. Heart is normal size. Aorta is normal caliber. Moderate aortic atherosclerosis. Scattered coronary artery calcifications. Mediastinum/Nodes: No mediastinal, hilar, or axillary adenopathy. Trachea and esophagus are unremarkable.  Thyroid  unremarkable. Lungs/Pleura: Nodular airspace opacity noted in the lingula as well as bilateral lower lobes. This likely reflects multifocal pneumonia. Bibasilar atelectasis. No effusions. Upper Abdomen: No acute findings Musculoskeletal: Chest wall soft tissues are unremarkable. No acute bony abnormality. Review of the MIP images confirms the above findings. IMPRESSION: Nodular airspace disease noted in the lingula and both lower lobes, right greater than left concerning for pneumonia. No evidence of pulmonary embolus. Aortic Atherosclerosis (ICD10-I70.0). Electronically Signed   By: Franky Crease M.D.   On: 01/26/2024 19:26   DG Chest Port 1 View Result Date: 01/26/2024 CLINICAL DATA:  Shortness of breath EXAM: PORTABLE CHEST 1 VIEW COMPARISON:  Chest x-ray 01/25/2024 FINDINGS: The heart size and mediastinal contours are within normal limits. Both lungs are clear. Right shoulder arthroplasty is present. IMPRESSION: No active disease. Electronically Signed   By: Greig Pique M.D.   On:  01/26/2024 17:18   DG Chest 2 View Result Date: 01/25/2024 CLINICAL DATA:  ShOB EXAM: CHEST - 2 VIEW COMPARISON:  03/17/2023 FINDINGS: Lungs are clear. Subcentimeter calcified granuloma in the posterior right lower lobe. Heart size and mediastinal contours are within normal limits. Aortic Atherosclerosis (ICD10-170.0). No effusion. Compression deformity T12 stable since 04/21/2021. IMPRESSION: No acute cardiopulmonary disease. Electronically Signed   By: JONETTA Faes M.D.   On: 01/25/2024 13:21   Scheduled Meds:  apixaban   5 mg Oral BID   carbidopa -levodopa   1 tablet Oral QHS   carbidopa -levodopa   1 tablet Oral Q24H   carbidopa -levodopa   2 tablet Oral TID   flecainide   100 mg Oral BID   guaiFENesin  600 mg Oral BID   pravastatin   40 mg Oral Daily   sodium chloride  flush  3 mL Intravenous Q12H   sodium chloride  flush  3 mL Intravenous Q12H   traZODone   100 mg Oral QHS   Continuous Infusions:  sodium chloride       cefTRIAXone (ROCEPHIN)  IV 2 g (01/27/24 1039)   doxycycline (VIBRAMYCIN) IV     lactated ringers  100 mL/hr at 01/27/24 0411     LOS: 1 day    Time spent: 50 mins    Darcel Dawley, MD [facility]   If 7PM-7AM, please contact night-coverage

## 2024-01-27 NOTE — Progress Notes (Signed)
 Echocardiogram 2D Echocardiogram has been performed.  Koleen KANDICE Popper, RDCS 01/27/2024, 2:40 PM

## 2024-01-27 NOTE — TOC CM/SW Note (Signed)
 Transition of Care [facility]) - Inpatient Brief Assessment   Patient Details  Name: Cassie White MRN: [id number] Date of Birth: 09-27-1940  [facility] CM/SW Contact:    Lauraine FORBES Saa, LCSW Phone Number: 01/27/2024, 10:18 AM   Clinical Narrative:  10:18 AM Per chart review, patient resides at home with child(ren). Patient has a PCP and insurance. Patient does not have SNF/HH/DME history. Patient's preferred pharmacy is [facility] [address]. No TOC needs were identified at this time. TOC will continue to follow and be available to assist.  Transition of Care Asessment: Insurance and Status: Insurance coverage has been reviewed Patient has primary care physician: Yes Home environment has been reviewed: Private Residence Prior level of function:: N/A Prior/Current Home Services: No current home services Social Drivers of Health Review: SDOH reviewed no interventions necessary Readmission risk has been reviewed: Yes Transition of care needs: no transition of care needs at this time

## 2024-01-27 NOTE — Plan of Care (Signed)

## 2024-01-28 ENCOUNTER — Telehealth: Payer: Self-pay

## 2024-01-28 DIAGNOSIS — D6869 Other thrombophilia: Secondary | ICD-10-CM

## 2024-01-28 DIAGNOSIS — I341 Nonrheumatic mitral (valve) prolapse: Secondary | ICD-10-CM

## 2024-01-28 DIAGNOSIS — I340 Nonrheumatic mitral (valve) insufficiency: Secondary | ICD-10-CM

## 2024-01-28 DIAGNOSIS — J189 Pneumonia, unspecified organism: Secondary | ICD-10-CM | POA: Diagnosis not present

## 2024-01-28 LAB — LEGIONELLA PNEUMOPHILA SEROGP 1 UR AG
L. pneumophila Serogp 1 Ur Ag: NEGATIVE
Source of Sample: 9985

## 2024-01-28 MED ORDER — HYDRALAZINE HCL 25 MG PO TABS
25.0000 mg | ORAL_TABLET | Freq: Three times a day (TID) | ORAL | Status: DC
  Administered 2024-01-28 – 2024-02-02 (×14): 25 mg via ORAL
  Filled 2024-01-28 (×14): qty 1

## 2024-01-28 MED ORDER — DOXYCYCLINE HYCLATE 100 MG PO TABS
100.0000 mg | ORAL_TABLET | Freq: Two times a day (BID) | ORAL | Status: DC
  Administered 2024-01-28 – 2024-01-31 (×7): 100 mg via ORAL
  Filled 2024-01-28 (×7): qty 1

## 2024-01-28 NOTE — Progress Notes (Signed)
 PROGRESS NOTE    Cassie White  FMW:[id number] DOB: [birth year] DOA: 01/26/2024 PCP: Candise [name], MD   Brief Narrative:  This 83 yrs old female with PMH significant of paroxysmal atrial fibrillation, mitral regurgitation, hyperlipidemia, GERD, osteoporosis, essential hypertension, chronic thrombocytopenia, GERD, and Parkinson disease presented to the ED with confusion this morning, cough and shortness of breath for last few days.  Family also noticed right-sided weakness, Patient unable to feed herself and has missed doses of Sinemet  for last few days.  She was hypoxic with SpO2 of 88% on room air in the ED requiring 3.5 L of supplemental oxygen to maintain saturation above 93%.  CTA chest shows no evidence of PE but shows nodular airspace disease concerning for pneumonia. Family reports patient initially had left-sided upper extremity weakness which resolved in the ED.  EDP initially approached neurologist Dr. Jerrie who states symptoms are not related to TIA,  there is no need to pursue TIA workup.  Patient was admitted for acute hypoxic respiratory failure secondary to community-acquired pneumonia and started on empiric antibiotics.  Assessment & Plan:   Principal Problem:   CAP (community acquired pneumonia) Active Problems:   Acute respiratory failure with hypoxia (HCC)   Acute metabolic encephalopathy   Hyperlipidemia   QT prolongation   Essential hypertension   Paroxysmal atrial fibrillation (HCC)   Elevated troponin   Chronic idiopathic thrombocytopenia (HCC)   GERD (gastroesophageal reflux disease)   History of Parkinson disease   Elevated brain natriuretic peptide (BNP) level   Acute hypoxic respiratory failure: Sepsis secondary to pneumonia: Concern for aspiration pneumonia: Patient presented in the ED with productive cough, shortness of breath, confusion, generalized weakness and increasing tremor of both upper extremities. She was hypotensive, tachycardic,  tachypneic and hypoxic in the ED. Chest x-ray unremarkable.  CTA chest showed bilateral lung pneumonia. Patient meets sepsis criteria  (hypotension, tachycardia and having a source of infection). CT head > No acute intracranial abnormality. Initiated on empiric antibiotics IV ceftriaxone and azithromycin . RVP shows metapneumovirus+, follow-up urine Legionella, sputum culture.   Follow up Blood culture Continue supplemental oxygen and wean as tolerated. Continue ceftriaxone and doxycycline. Discontinue droplet precautions.  Elevated BNP: Elevated troponin-secondary to demand ischemia: Elevated BNP around 438.  No previous history of CHF.   Troponin 25 > 25 flat.  EKG showing atrial fibrillation rate controlled.  2D echo shows LVEF 60 to 65%, no regional wall motion abnormalities.  Prolonged Qtc: Underlying prolonged QTc in the setting of chronic Sinemet  use.   At this time unable to discontinue the Sinemet  abruptly.   Need to seen by neurology clinic outpatient for dose adjustment. Continue to monitor electrolytes while patient here in the hospital and continue cardiac monitoring.   Acute metabolic encephalopathy / Confusion - resolved. Family reports that patient was confused and unable to feed herself with right hand due to increased tremor which is causing weakness, generalized weakness and fatigue.  Patient has underlying Parkinson disease and has tremor and bradykinesia at the baseline. Concern for acute metabolic encephalopathy specifically confusion in the setting of acute hypoxic respiratory failure due to pneumonia. CT head > No acute intracranial abnormality. At this time there is no concern for TIA given patient has generalized weakness, increased tremor in the setting of acute hypoxic respiratory failure and missing dose of Sinemet .  Physical exam did not appreciate any evidence of upper and lower extremity weakness and per family patient is at baseline. Dr. Tomma saw  discussed with neurology Dr. Jerrie  >  No concern for TIA at this time and there is no need to pursue TIA workup as well. Continue to treat for pneumonia and hypoxic respiratory failure as mentioned above.   Essential hypertension Holding Lopressor  in the setting of hypotension.   Paroxysmal atrial fibrillation: Continue flecainide  and Eliquis .   Holding metoprolol  in setting of hypotension.   Chronic idiopathic leukocytopenia: Stable platelet count continue to monitor.   History of Parkinson's disease: Continue Sinemet .     DVT prophylaxis: Eliquis  Code Status: Full code Family Communication: No family at bed side Disposition Plan:    Status is: Inpatient Remains inpatient appropriate because: Admitted for Acute hypoxic respiratory failure, secondary to community acquired PNA.    Consultants:  Neurology  Procedures: CTA Chest  Antimicrobials:  Anti-infectives (From admission, onward)    Start     Dose/Rate Route Frequency Ordered Stop   01/28/24 0708  doxycycline (VIBRA-TABS) tablet 100 mg        100 mg Oral Every 12 hours 01/28/24 0708 02/03/24 0959   01/27/24 2200  doxycycline (VIBRAMYCIN) 100 mg in sodium chloride  0.9 % 250 mL IVPB  Status:  Discontinued        100 mg 125 mL/hr over 120 Minutes Intravenous Every 12 hours 01/26/24 2202 01/28/24 0708   01/27/24 0800  cefTRIAXone (ROCEPHIN) 2 g in sodium chloride  0.9 % 100 mL IVPB        2 g 200 mL/hr over 30 Minutes Intravenous Every 24 hours 01/26/24 2202 02/02/24 0959   01/27/24 0000  Ampicillin-Sulbactam (UNASYN) 3 g in sodium chloride  0.9 % 100 mL IVPB  Status:  Discontinued        3 g 200 mL/hr over 30 Minutes Intravenous Every 6 hours 01/26/24 2034 01/26/24 2200   01/26/24 2000  cefTRIAXone (ROCEPHIN) 1 g in sodium chloride  0.9 % 100 mL IVPB        1 g 200 mL/hr over 30 Minutes Intravenous  Once 01/26/24 1948 01/26/24 2145   01/26/24 2000  azithromycin  (ZITHROMAX ) 500 mg in sodium chloride  0.9 % 250 mL IVPB         500 mg 250 mL/hr over 60 Minutes Intravenous  Once 01/26/24 1948 01/26/24 2311      Subjective: Patient was seen and examined at bedside.Overnight events noted. Patient reports feeling much improved,  She is on 2 L of supplemental oxygen. Daughter is at bedside.  Patient is back to her baseline mental status.  Objective: Vitals:   01/27/24 2325 01/28/24 0600 01/28/24 0700 01/28/24 0931  BP: (!) 135/53 134/63 (!) 139/105 133/71  Pulse: 100 74 79   Resp: (!) 24 19 20    Temp: 98.6 F (37 C) 98.3 F (36.8 C) 98.2 F (36.8 C)   TempSrc: Oral Oral Oral   SpO2: 92% 93% 95%   Weight:      Height:        Intake/Output Summary (Last 24 hours) at 01/28/2024 1138 Last data filed at 01/27/2024 1703 Gross per 24 hour  Intake 2616.61 ml  Output --  Net 2616.61 ml   Filed Weights   01/26/24 1703 01/26/24 2300  Weight: 60.8 kg 64.2 kg    Examination:  General exam: Appears calm and comfortable, deconditioned, not in any acute distress. Respiratory system: CTA Bilaterally. Respiratory effort normal.  RR 16 Cardiovascular system: S1 & S2 heard, RRR. No JVD, murmurs, rubs, gallops or clicks.  Gastrointestinal system: Abdomen is non distended, soft and non tender. Normal bowel sounds heard. Central nervous system: Alert  and oriented X 3. No focal neurological deficits. Extremities: No Edema, no cyanosis, no clubbing. Skin: No rashes, lesions or ulcers Psychiatry: Judgement and insight appear normal. Mood & affect appropriate.     Data Reviewed: I have personally reviewed following labs and imaging studies  CBC: Recent Labs  Lab 01/26/24 1645 01/27/24 0241  WBC 6.0 4.3  NEUTROABS 3.7  --   HGB 12.5 12.4  HCT 38.7 37.4  MCV 96.5 94.2  PLT 101* 107*   Basic Metabolic Panel: Recent Labs  Lab 01/26/24 1645 01/27/24 0241  NA 137 136  K 3.8 4.2  CL 101 103  CO2 21* 23  GLUCOSE 129* 195*  BUN 12 11  CREATININE 0.76 0.79  CALCIUM  8.6* 8.7*   GFR: Estimated  Creatinine Clearance: 46.6 mL/min (by C-G formula based on SCr of 0.79 mg/dL). Liver Function Tests: No results for input(s): AST, ALT, ALKPHOS, BILITOT, PROT, ALBUMIN in the last 168 hours. No results for input(s): LIPASE, AMYLASE in the last 168 hours. No results for input(s): AMMONIA in the last 168 hours. Coagulation Profile: No results for input(s): INR, PROTIME in the last 168 hours. Cardiac Enzymes: No results for input(s): CKTOTAL, CKMB, CKMBINDEX, TROPONINI in the last 168 hours. BNP (last 3 results) No results for input(s): PROBNP in the last 8760 hours. HbA1C: No results for input(s): HGBA1C in the last 72 hours. CBG: Recent Labs  Lab 01/26/24 1816  GLUCAP 151*   Lipid Profile: No results for input(s): CHOL, HDL, LDLCALC, TRIG, CHOLHDL, LDLDIRECT in the last 72 hours. Thyroid  Function Tests: No results for input(s): TSH, T4TOTAL, FREET4, T3FREE, THYROIDAB in the last 72 hours. Anemia Panel: No results for input(s): VITAMINB12, FOLATE, FERRITIN, TIBC, IRON, RETICCTPCT in the last 72 hours. Sepsis Labs: Recent Labs  Lab 01/26/24 1818  PROCALCITON <0.10    Recent Results (from the past 240 hours)  Resp panel by RT-PCR (RSV, Flu A&B, Covid) Anterior Nasal Swab     Status: None   Collection Time: 01/25/24 11:34 AM   Specimen: Anterior Nasal Swab  Result Value Ref Range Status   SARS Coronavirus 2 by RT PCR NEGATIVE NEGATIVE Final    Comment: (NOTE) SARS-CoV-2 target nucleic acids are NOT DETECTED.  The SARS-CoV-2 RNA is generally detectable in upper respiratory specimens during the acute phase of infection. The lowest concentration of SARS-CoV-2 viral copies this assay can detect is 138 copies/mL. A negative result does not preclude SARS-Cov-2 infection and should not be used as the sole basis for treatment or other patient management decisions. A negative result may occur with  improper specimen  collection/handling, submission of specimen other than nasopharyngeal swab, presence of viral mutation(s) within the areas targeted by this assay, and inadequate number of viral copies(<138 copies/mL). A negative result must be combined with clinical observations, patient history, and epidemiological information. The expected result is Negative.  Fact Sheet for Patients:  BloggerCourse.com  Fact Sheet for Healthcare Providers:  SeriousBroker.it  This test is no t yet approved or cleared by the [place]  FDA and  has been authorized for detection and/or diagnosis of SARS-CoV-2 by FDA under an Emergency Use Authorization (EUA). This EUA will remain  in effect (meaning this test can be used) for the duration of the COVID-19 declaration under Section 564(b)(1) of the Act, 21 U.S.C.section 360bbb-3(b)(1), unless the authorization is terminated  or revoked sooner.       Influenza A by PCR NEGATIVE NEGATIVE Final   Influenza B by PCR NEGATIVE NEGATIVE Final  Comment: (NOTE) The Xpert Xpress SARS-CoV-2/FLU/RSV plus assay is intended as an aid in the diagnosis of influenza from Nasopharyngeal swab specimens and should not be used as a sole basis for treatment. Nasal washings and aspirates are unacceptable for Xpert Xpress SARS-CoV-2/FLU/RSV testing.  Fact Sheet for Patients: BloggerCourse.com  Fact Sheet for Healthcare Providers: SeriousBroker.it  This test is not yet approved or cleared by the [place]  FDA and has been authorized for detection and/or diagnosis of SARS-CoV-2 by FDA under an Emergency Use Authorization (EUA). This EUA will remain in effect (meaning this test can be used) for the duration of the COVID-19 declaration under Section 564(b)(1) of the Act, 21 U.S.C. section 360bbb-3(b)(1), unless the authorization is terminated or revoked.     Resp Syncytial  Virus by PCR NEGATIVE NEGATIVE Final    Comment: (NOTE) Fact Sheet for Patients: BloggerCourse.com  Fact Sheet for Healthcare Providers: SeriousBroker.it  This test is not yet approved or cleared by the [place]  FDA and has been authorized for detection and/or diagnosis of SARS-CoV-2 by FDA under an Emergency Use Authorization (EUA). This EUA will remain in effect (meaning this test can be used) for the duration of the COVID-19 declaration under Section 564(b)(1) of the Act, 21 U.S.C. section 360bbb-3(b)(1), unless the authorization is terminated or revoked.  Performed at [facility], [address] [place], [place] [address]   Expectorated Sputum Assessment w Gram Stain, Rflx to Resp Cult     Status: None   Collection Time: 01/26/24  8:18 PM   Specimen: Sputum  Result Value Ref Range Status   Specimen Description SPUTUM  Final   Special Requests NONE  Final   Sputum evaluation   Final    Sputum specimen not acceptable for testing.  Please recollect.   Gram Stain Report Called to,Read Back By and Verified With: RN C. FRANKLIN [id number] @ [phone] FH Performed at [facility], [address], [place], [place] [address]    Report Status 01/27/2024 FINAL  Final  Respiratory (~20 pathogens) panel by PCR     Status: Abnormal   Collection Time: 01/26/24  8:45 PM   Specimen: Nasopharyngeal Swab; Respiratory  Result Value Ref Range Status   Adenovirus NOT DETECTED NOT DETECTED Final   Coronavirus 229E NOT DETECTED NOT DETECTED Final    Comment: (NOTE) The Coronavirus on the Respiratory Panel, DOES NOT test for the novel  Coronavirus (2019 nCoV)    Coronavirus HKU1 NOT DETECTED NOT DETECTED Final   Coronavirus NL63 NOT DETECTED NOT DETECTED Final   Coronavirus OC43 NOT DETECTED NOT DETECTED Final   Metapneumovirus DETECTED (A) NOT DETECTED Final   Rhinovirus / Enterovirus NOT DETECTED NOT DETECTED Final   Influenza  A NOT DETECTED NOT DETECTED Final   Influenza B NOT DETECTED NOT DETECTED Final   Parainfluenza Virus 1 NOT DETECTED NOT DETECTED Final   Parainfluenza Virus 2 NOT DETECTED NOT DETECTED Final   Parainfluenza Virus 3 NOT DETECTED NOT DETECTED Final   Parainfluenza Virus 4 NOT DETECTED NOT DETECTED Final   Respiratory Syncytial Virus NOT DETECTED NOT DETECTED Final   Bordetella pertussis NOT DETECTED NOT DETECTED Final   Bordetella Parapertussis NOT DETECTED NOT DETECTED Final   Chlamydophila pneumoniae NOT DETECTED NOT DETECTED Final   Mycoplasma pneumoniae NOT DETECTED NOT DETECTED Final    Comment: Performed at [facility], [address], [place], [place] [address]  Culture, blood (routine x 2) Call MD if unable to obtain prior  to antibiotics being given     Status: None (Preliminary result)   Collection Time: 01/26/24  8:55 PM   Specimen: BLOOD  Result Value Ref Range Status   Specimen Description BLOOD RIGHT ANTECUBITAL  Final   Special Requests   Final    BOTTLES DRAWN AEROBIC AND ANAEROBIC Blood Culture adequate volume   Culture   Final    NO GROWTH 2 DAYS Performed at [facility], [address] [place], [place] [address]    Report Status PENDING  Incomplete  MRSA Next Gen by PCR, Nasal     Status: None   Collection Time: 01/26/24 11:05 PM   Specimen: Nasal Mucosa; Nasal Swab  Result Value Ref Range Status   MRSA by PCR Next Gen NOT DETECTED NOT DETECTED Final    Comment: (NOTE) The GeneXpert MRSA Assay (FDA approved for NASAL specimens only), is one component of a comprehensive MRSA colonization surveillance program. It is not intended to diagnose MRSA infection nor to guide or monitor treatment for MRSA infections. Test performance is not FDA approved in patients less than 26 years old. Performed at [facility], [address] [place], [place] [address]     Radiology Studies: ECHOCARDIOGRAM COMPLETE Result Date: 01/27/2024    ECHOCARDIOGRAM  REPORT   Patient Name:   Cassie White Date of Exam: 01/27/2024 Medical Rec #:  [id number]       Height:       61.0 in Accession #:    [id number]      Weight:       141.5 lb Date of Birth:  June 16, 1941        BSA:          1.631 m Patient Age:    82 years        BP:           134/77 mmHg Patient Gender: F               HR:           72 bpm. Exam Location:  Inpatient Procedure: 2D Echo, Strain Analysis, Cardiac Doppler and Color Doppler (Both            Spectral and Color Flow Doppler were utilized during procedure). Indications:    Elevated troponin  History:        Patient has prior history of Echocardiogram examinations, most                 recent 09/27/2015. Mitral Valve Prolapse and Mitral                 Regurgitation; Risk Factors:Dyslipidemia and Hypertension.  Sonographer:    Koleen Popper RDCS Referring Phys: [phone] SUBRINA SUNDIL  Sonographer Comments: Global longitudinal strain was attempted. IMPRESSIONS  1. Left ventricular ejection fraction, by estimation, is 60 to 65%. The left ventricle has normal function. The left ventricle has no regional wall motion abnormalities. There is mild left ventricular hypertrophy. Left ventricular diastolic parameters are indeterminate. The average left ventricular global longitudinal strain is 15.8 %. The global longitudinal strain is abnormal.  2. Right ventricular systolic function is normal. The right ventricular size is normal. There is moderately elevated pulmonary artery systolic pressure. The estimated right ventricular systolic pressure is 58.4 mmHg.  3. Left atrial size was moderately dilated.  4. Right atrial size was mildly dilated.  5. There is severe 4+ mitral regurgitation around A3/P3. There is calcification & thickening of the mitral  valve leaflets with mild annular calcification. There is mild prolapse. Consider TEE for further evaluation.  6. Tricuspid valve regurgitation is moderate to severe.  7. The aortic valve is normal in structure. There is mild  calcification of the aortic valve. Aortic valve regurgitation is mild. No aortic stenosis is present.  8. The inferior vena cava is normal in size with greater than 50% respiratory variability, suggesting right atrial pressure of 3 mmHg. FINDINGS  Left Ventricle: Left ventricular ejection fraction, by estimation, is 60 to 65%. The left ventricle has normal function. The left ventricle has no regional wall motion abnormalities. The average left ventricular global longitudinal strain is 15.8 %. Strain was performed and the global longitudinal strain is abnormal. The left ventricular internal cavity size was normal in size. There is mild left ventricular hypertrophy. Left ventricular diastolic parameters are indeterminate. Right Ventricle: The right ventricular size is normal. No increase in right ventricular wall thickness. Right ventricular systolic function is normal. There is moderately elevated pulmonary artery systolic pressure. The tricuspid regurgitant velocity is 3.55 m/s, and with an assumed right atrial pressure of 8 mmHg, the estimated right ventricular systolic pressure is 58.4 mmHg. Left Atrium: Left atrial size was moderately dilated. Right Atrium: Right atrial size was mildly dilated. Pericardium: There is no evidence of pericardial effusion. Mitral Valve: The mitral valve is normal in structure. There is mild prolapse of of the mitral valve. There is mild thickening of the mitral valve leaflet(s). Mild mitral annular calcification. Severe mitral valve regurgitation. No evidence of mitral valve stenosis. Tricuspid Valve: The tricuspid valve is normal in structure. Tricuspid valve regurgitation is moderate to severe. No evidence of tricuspid stenosis. Aortic Valve: The aortic valve is normal in structure. There is mild calcification of the aortic valve. There is mild aortic valve annular calcification. Aortic valve regurgitation is mild. No aortic stenosis is present. Pulmonic Valve: The pulmonic valve  was normal in structure. Pulmonic valve regurgitation is not visualized. No evidence of pulmonic stenosis. Aorta: The aortic root is normal in size and structure. Venous: The inferior vena cava is normal in size with greater than 50% respiratory variability, suggesting right atrial pressure of 3 mmHg. IAS/Shunts: No atrial level shunt detected by color flow Doppler. Additional Comments: 3D was performed not requiring image post processing on an independent workstation and was indeterminate.  LEFT VENTRICLE PLAX 2D LVIDd:         4.60 cm   Diastology LVIDs:         2.90 cm   LV e' medial:    6.53 cm/s LV PW:         1.20 cm   LV E/e' medial:  18.5 LV IVS:        1.30 cm   LV e' lateral:   12.40 cm/s LVOT diam:     2.00 cm   LV E/e' lateral: 9.8 LV SV:         68 LV SV Index:   41        2D Longitudinal Strain LVOT Area:     3.14 cm  2D Strain GLS Avg:     15.8 %  RIGHT VENTRICLE             IVC RV Basal diam:  3.80 cm     IVC diam: 2.15 cm RV Mid diam:    2.60 cm RV S prime:     17.40 cm/s TAPSE (M-mode): 1.8 cm LEFT ATRIUM  Index        RIGHT ATRIUM           Index LA diam:        4.90 cm 3.00 cm/m   RA Area:     12.20 cm LA Vol (A2C):   79.5 ml 48.75 ml/m  RA Volume:   25.20 ml  15.45 ml/m LA Vol (A4C):   78.2 ml 47.96 ml/m LA Biplane Vol: 83.9 ml 51.45 ml/m  AORTIC VALVE LVOT Vmax:   92.80 cm/s LVOT Vmean:  66.000 cm/s LVOT VTI:    0.215 m  AORTA Ao Root diam: 3.10 cm Ao Asc diam:  3.20 cm MITRAL VALVE                TRICUSPID VALVE MV Area (PHT): 3.72 cm     TR Peak grad:   50.4 mmHg MV Decel Time: 204 msec     TR Vmax:        355.00 cm/s MV E velocity: 121.00 cm/s MV A velocity: 81.20 cm/s   SHUNTS MV E/A ratio:  1.49         Systemic VTI:  0.22 m                             Systemic Diam: 2.00 cm Aditya Sabharwal Electronically signed by Ria Commander Signature Date/Time: 01/27/2024/3:29:23 PM    Final    CT HEAD WO CONTRAST Result Date: 01/26/2024 CLINICAL DATA:  Mental status change  EXAM: CT HEAD WITHOUT CONTRAST TECHNIQUE: Contiguous axial images were obtained from the base of the skull through the vertex without intravenous contrast. RADIATION DOSE REDUCTION: This exam was performed according to the departmental dose-optimization program which includes automated exposure control, adjustment of the mA and/or kV according to patient size and/or use of iterative reconstruction technique. COMPARISON:  CT head 03/17/2023. FINDINGS: Brain: No acute intracranial hemorrhage. No CT evidence of acute infarct. Nonspecific hypoattenuation in the periventricular and subcortical white matter favored to reflect chronic microvascular ischemic changes. No edema, mass effect, or midline shift. The basilar cisterns are patent. Ventricles: Prominence of the ventricles suggesting underlying parenchymal volume loss. Vascular: Atherosclerotic calcifications of the carotid siphons and intracranial vertebral arteries. No hyperdense vessel. Skull: No acute or aggressive finding. Orbits: Bilateral lens replacement. Orbits are otherwise unremarkable. Sinuses: Mucosal thickening throughout the paranasal sinuses with air-fluid levels in the bilateral maxillary sinuses. Other: Mastoid air cells are clear. IMPRESSION: No CT evidence of acute intracranial abnormality. Paranasal sinus disease with air-fluid levels in the bilateral maxillary sinuses. Recommend clinical correlation for acute sinusitis. Chronic microvascular ischemic changes and mild parenchymal volume loss. Electronically Signed   By: Donnice Mania M.D.   On: 01/26/2024 19:28   CT Angio Chest PE W and/or Wo Contrast Result Date: 01/26/2024 CLINICAL DATA:  Pulmonary embolism (PE) suspected, high prob EXAM: CT ANGIOGRAPHY CHEST WITH CONTRAST TECHNIQUE: Multidetector CT imaging of the chest was performed using the standard protocol during bolus administration of intravenous contrast. Multiplanar CT image reconstructions and MIPs were obtained to evaluate the  vascular anatomy. RADIATION DOSE REDUCTION: This exam was performed according to the departmental dose-optimization program which includes automated exposure control, adjustment of the mA and/or kV according to patient size and/or use of iterative reconstruction technique. CONTRAST:  75mL OMNIPAQUE IOHEXOL 350 MG/ML SOLN COMPARISON:  None Available. FINDINGS: Cardiovascular: No filling defects in the pulmonary arteries to suggest pulmonary emboli. Heart is normal size. Aorta is normal caliber.  Moderate aortic atherosclerosis. Scattered coronary artery calcifications. Mediastinum/Nodes: No mediastinal, hilar, or axillary adenopathy. Trachea and esophagus are unremarkable. Thyroid  unremarkable. Lungs/Pleura: Nodular airspace opacity noted in the lingula as well as bilateral lower lobes. This likely reflects multifocal pneumonia. Bibasilar atelectasis. No effusions. Upper Abdomen: No acute findings Musculoskeletal: Chest wall soft tissues are unremarkable. No acute bony abnormality. Review of the MIP images confirms the above findings. IMPRESSION: Nodular airspace disease noted in the lingula and both lower lobes, right greater than left concerning for pneumonia. No evidence of pulmonary embolus. Aortic Atherosclerosis (ICD10-I70.0). Electronically Signed   By: Franky Crease M.D.   On: 01/26/2024 19:26   DG Chest Port 1 View Result Date: 01/26/2024 CLINICAL DATA:  Shortness of breath EXAM: PORTABLE CHEST 1 VIEW COMPARISON:  Chest x-ray 01/25/2024 FINDINGS: The heart size and mediastinal contours are within normal limits. Both lungs are clear. Right shoulder arthroplasty is present. IMPRESSION: No active disease. Electronically Signed   By: Greig Pique M.D.   On: 01/26/2024 17:18   Scheduled Meds:  apixaban   5 mg Oral BID   carbidopa -levodopa   1 tablet Oral QHS   carbidopa -levodopa   1 tablet Oral Q24H   carbidopa -levodopa   2 tablet Oral TID   doxycycline  100 mg Oral Q12H   flecainide   100 mg Oral BID    guaiFENesin  600 mg Oral BID   hydrALAZINE  25 mg Oral Q8H   pravastatin   40 mg Oral Daily   sodium chloride  flush  3 mL Intravenous Q12H   sodium chloride  flush  3 mL Intravenous Q12H   traZODone   100 mg Oral QHS   Continuous Infusions:  cefTRIAXone (ROCEPHIN)  IV 2 g (01/28/24 0934)     LOS: 2 days    Time spent: 35 mins    Darcel Dawley, MD [facility]   If 7PM-7AM, please contact night-coverage

## 2024-01-28 NOTE — Evaluation (Signed)
 Physical Therapy Evaluation Patient Details Name: Cassie White MRN: [id number] DOB: 28-Nov-1940 Today's Date: 01/28/2024  History of Present Illness  Pt is 83 yo presenting to [facility] ED due to confusion, cough and shortness of breathe with R sided weakness which seems to have resolved. Pt admitted for acute hypoxic respiratory failure secondary to CAP. PMH: paroxysmal a-fib, mitral regurgitation, hyperlipidemia, GERD, osteoporosis, HTN, chronic thrombocytopenia, GERD and parkinson's disease.  Clinical Impression  Pt is presenting below baseline level of functioning. Currently pt is Min A for bed mobility and was unable to stand with AD. Pt requires Max A for stand pivot transfer from EOB to [facility]. Due to pt current functional status, home set up and available assistance at home recommending skilled physical therapy services < 3 hours/day in order to address strength, balance and functional mobility to decrease risk for falls, injury, immobility, skin break down and re-hospitalization.          If plan is discharge home, recommend the following: Two people to help with walking and/or transfers;Assistance with cooking/housework;Assist for transportation;Help with stairs or ramp for entrance   Can travel by private vehicle   No    Equipment Recommendations Hoyer lift;Hospital bed     Functional Status Assessment Patient has had a recent decline in their functional status and demonstrates the ability to make significant improvements in function in a reasonable and predictable amount of time.     Precautions / Restrictions Precautions Precautions: Fall Recall of Precautions/Restrictions: Intact Restrictions Weight Bearing Restrictions Per Provider Order: No      Mobility  Bed Mobility Overal bed mobility: Needs Assistance Bed Mobility: Supine to Sit     Supine to sit: Min assist     General bed mobility comments: Min A for trunk to midline and scooting to EOB with HOB elevated     Transfers Overall transfer level: Needs assistance Equipment used: Rolling walker (2 wheels), None Transfers: Sit to/from Stand, Bed to chair/wheelchair/BSC Sit to Stand: Total assist (unable to stand with RW) Stand pivot transfers: Max assist         General transfer comment: Max A standing in front of patient for stand pivot transfer.    Ambulation/Gait     General Gait Details: unable to clear feet for steps     Balance Overall balance assessment: Needs assistance Sitting-balance support: Single extremity supported Sitting balance-Leahy Scale: Fair Sitting balance - Comments: supervision sitting EOB   Standing balance support: Bilateral upper extremity supported Standing balance-Leahy Scale: Zero Standing balance comment: Max A for standing balance.         Pertinent Vitals/Pain Pain Assessment Pain Assessment: No/denies pain    Home Living Family/patient expects to be discharged to:: Private residence Living Arrangements: Children ([id number]) Available Help at Discharge: Family;Available 24 hours/day Type of Home: House Home Access: Ramped entrance       Home Layout: Multi-level;Able to live on main level with bedroom/bathroom Home Equipment: Rolling Walker (2 wheels);Toilet riser;Transport chair;Shower seat - built in      Prior Function Prior Level of Function : Needs assist;Independent/Modified Independent;History of Falls (last six months)  Cognitive Assist : ADLs (cognitive)   ADLs (Cognitive): Set up cues       Mobility Comments: uses RW in home and transport chair in the community. ADLs Comments: reports mod I with ADL's, medication daughter helps,Family helps with transportation     Extremity/Trunk Assessment   Upper Extremity Assessment Upper Extremity Assessment: Defer to OT evaluation    Lower  Extremity Assessment Lower Extremity Assessment: Generalized weakness    Cervical / Trunk Assessment Cervical / Trunk Assessment: Normal   Communication   Communication Communication: Impaired Factors Affecting Communication: Hearing impaired    Cognition Arousal: Alert Behavior During Therapy: WFL for tasks assessed/performed   PT - Cognitive impairments: No apparent impairments       Following commands: Intact       Cueing Cueing Techniques: Verbal cues     General Comments General comments (skin integrity, edema, etc.): Daughter present during session. Difficulty getting O2 reading during session        Assessment/Plan    PT Assessment Patient needs continued PT services  PT Problem List Decreased strength;Decreased activity tolerance;Decreased balance;Decreased mobility       PT Treatment Interventions DME instruction;Gait training;Functional mobility training;Therapeutic activities;Therapeutic exercise;Balance training;Patient/family education    PT Goals (Current goals can be found in the Care Plan section)  Acute Rehab PT Goals Patient Stated Goal: To improve mobility PT Goal Formulation: With patient/family Time For Goal Achievement: 02/11/24 Potential to Achieve Goals: Good    Frequency Min 1X/week        AM-PAC PT 6 Clicks Mobility  Outcome Measure Help needed turning from your back to your side while in a flat bed without using bedrails?: A Little Help needed moving from lying on your back to sitting on the side of a flat bed without using bedrails?: A Little Help needed moving to and from a bed to a chair (including a wheelchair)?: A Lot Help needed standing up from a chair using your arms (e.g., wheelchair or bedside chair)?: A Lot Help needed to walk in hospital room?: Total Help needed climbing 3-5 steps with a railing? : Total 6 Click Score: 12    End of Session Equipment Utilized During Treatment: Gait belt Activity Tolerance: Patient tolerated treatment well Patient left: Other (comment);with family/visitor present (On BSC with OT and daughter in room) Nurse  Communication: Mobility status PT Visit Diagnosis: Other abnormalities of gait and mobility (R26.89);Muscle weakness (generalized) (M62.81);History of falling (Z91.81)    Time: 8670-8646 PT Time Calculation (min) (ACUTE ONLY): 24 min   Charges:   PT Evaluation $PT Eval Low Complexity: 1 Low PT Treatments $Therapeutic Activity: 8-22 mins PT General Charges $$ ACUTE PT VISIT: 1 Visit         Dorothyann Maier, DPT, CLT  Acute Rehabilitation Services Office: [phone] (Secure chat preferred)   Dorothyann VEAR Maier 01/28/2024, 2:04 PM

## 2024-01-28 NOTE — TOC Progression Note (Addendum)
 Transition of Care [facility]) - Progression Note    Patient Details  Name: Cassie White MRN: [id number] Date of Birth: 05-26-1941  [facility] CM/SW Contact  Otelia Hettinger A [name], LCSW Phone Number: 01/28/2024, 3:46 PM  Clinical Narrative:     CSW met with pt and pt's daughter Arland at bedside. Agreeable to SNF for rehab, requested [facility]. Pt is accepted to their ALF and had been in the process of admitting to their facility before her hospital admission. CSW reached out to [facility] [place] and informed her that pt is recommended rehab at discharge. SNF workup completed, referral send in hub to [facility]. EDD in couple days, CSW to start authorization as DC date approaches.    TOC will continue to follow.   Expected Discharge Plan: Skilled Nursing Facility Barriers to Discharge: Continued Medical Work up  Expected Discharge Plan and Services                                               Social Determinants of Health (SDOH) Interventions SDOH Screenings   Food Insecurity: No Food Insecurity (01/27/2024)  Housing: Patient Declined (01/27/2024)  Transportation Needs: Patient Declined (01/27/2024)  Utilities: Patient Declined (01/27/2024)  Alcohol Screen: Low Risk  (07/22/2023)  Depression (PHQ2-9): Low Risk  (09/18/2023)  Financial Resource Strain: Low Risk  (07/22/2023)  Physical Activity: Inactive (07/22/2023)  Social Connections: Moderately Isolated (07/22/2023)  Stress: No Stress Concern Present (07/22/2023)  Tobacco Use: Low Risk  (01/26/2024)  Health Literacy: Adequate Health Literacy (07/22/2023)    Readmission Risk Interventions     No data to display

## 2024-01-28 NOTE — Consult Note (Addendum)
 Cardiology Consultation   Patient ID: [name] MRN: [id number]; DOB: [birth year]  Admit date: 01/26/2024 Date of Consult: 01/28/2024  PCP:  Candise [name], MD   [facility] Providers Cardiologist:  Peter [name], MD    Patient Profile: JANEESE White is a 83 y.o. female with a hx of paroxysmal atrial fibrillation, MVP with mitral regurgitation, hyperlipidemia, Parkinson's, chronic thrombocytopenia who is being seen 01/28/2024 for the evaluation of severe mitral valve regurgitation at the request of Dr. Leotis.  History of Present Illness: Ms. [name] has history of paroxysmal atrial fibrillation and has been managed on flecainide  for this.  Per chart review seems that she predominantly maintains sinus rhythm and has tolerated therapy very well.  Reportedly has history of mitral valve prolapse however prior to admission last echocardiogram was in February 2017 with only mild regurgitation.  Also of note had normal stress test during same time period, breast attenuation artifact noted.  Currently patient being evaluated for sepsis secondary to pneumonia.  Respiratory panel positive for metapneumovirus. Presenting symptoms were worsening confusion, productive cough, shortness of breath.  Echocardiogram obtained that shows severe mitral regurgitation.  Also has had paroxysmal atrial fibrillation episodes throughout this admission but currently maintaining NSR.  Patient today is accompanied with her daughter who she lives with.  Reports that her cognition is improving however spoke on her behalf the entire visit.  Daughter reports that patient has had Parkinson's for several years now but notes some decline more recently.  Previously used to be able to be self-sufficient, feeding herself, eating, walking with a cane around the house.  She has noted confusion since being diagnosed with a UTI and pneumonia however cognitively she reports is pretty sharp and does not have any  significant impairment.  She reports that patient is asymptomatic from her atrial fibrillation and has not noted any significant shortness of breath, chest pain, palpitations, dizziness, falls.  She has not had any significant issues with volume.  Only notes very mild swelling peripherally with sock markings outlined on her ankles.  No orthopnea.  TSH normal.  BNP 338.  Potassium 4.2.  Creatinine 0.79.  Hemoglobin 12.4.   Past Medical History:  Diagnosis Date   Abnormal mammogram of left breast    Likely benign microcalcifications--repeat L diag mammo 03/24/2017.  COMPLEX SCLEROSING LESION WITH CALCIFICATIONS --no sign of malignancy.  Excision recommended--pt has been referred to Dr. Ebbie, who recommended f/u mammo and this was normal 12/2017--consider rpt 1 yr per rad.   Arthritis    DJD, back and both hips   Chronic low back pain without sciatica    Lumbar spondylosis + scoliosis.  Summer 2017 Dr. Bonner did ESI and pt got no relief.  Pt then saw Dr. Darlean [facility]: felt she had facet mediated pain; plan for B L345 MBB (??).   COVID 08/2022   Cystocele, midline    Diverticulosis    on colonoscopy 2004   GERD (gastroesophageal reflux disease)    Hx of cardiovascular stress test    Lexiscan  Myoview  (2/14):  Low risk, small ant defect likely breast attenuation, no ischemia, EF 69% (no change from 2010).     Hyperlipidemia    Hypertension    per pt   Idiopathic scoliosis of thoracolumbar region    Lumbar back pain    Mitral regurgitation    Echo (2/14):  EF 60-65%, Gr 1 DD, mild AI, mild bileaflet MVP, mod post directed MR, mild LAE, PASP 35.  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  Responding to gabapentin  (Dr. Chalice).   Osteoporosis, senile    stable left hip 2015 (took fosamax  x 38yrs, T-score -2.7 when she stopped bisphosphonate).  Rpt DEXA 64mo later (09/2016) T-score -.3.1  (different machine, though). 11/2019 DEXA T-score -4. Recommended restart of fosamax    PAF (paroxysmal atrial fibrillation) (HCC)    Dr. [name]: Flecainide , metopr, apxiaban.  08/2017-->persistent a-fib w/RVR, flecainide  increased and she converted back to NSR and felt better.  100mg  bid flecainide  continued.   Parkinson's disease (HCC) 2020 dx   started sinemet  05/2019 (Dr. Evonnie)   Peripheral neuropathy 2008   Idiopathic vs familial (motor>sensory) vs lumbar disc dz: gabapentin  helpful   Shoulder pain left   RC surg 09/2014   Unsteady gait     Past Surgical History:  Procedure Laterality Date   BREAST BIOPSY  04/10/2017   Left breast core needle biopsy of calcifications.  COMPLEX SCLEROSING LESION WITH CALCIFICATIONS --no sign of malignancy.  Excision recommended--pt has been referred to Dr. Ebbie, who recommended rpt mammo and this was NORMAL 12/2017.   BREAST EXCISIONAL BIOPSY Right 2010   Benign   BREAST SURGERY  Nov 2010   Benign biopsy, right   CHOLECYSTECTOMY  03/2010   Dr.Gross   COLONOSCOPY  10/03/2002   No polyps.  Repeat 10 yrs recommended but pt declines.  Pt declined cologuard 08/2016 but changed her mind and this test was NEG on 03/23/17.   COMBINED HYSTERECTOMY VAGINAL / OOPHORECTOMY / A&P REPAIR  1990   uncertain if ovaries were removed or not   CYSTOCELE REPAIR     DEXA  11/2019   T score -4   Lumpectomy  06/2009   Fatty Necrosis   PFT's  2014   NORMAL   REVERSE SHOULDER ARTHROPLASTY Right 03/11/2019   Procedure: REVERSE TOTAL SHOULDER ARTHROPLASTY;  Surgeon: Kay Kemps, MD;  Location: [facility];  Service: Orthopedics;  Laterality: Right;  Intersclene block   ROTATOR CUFF REPAIR Left 10/02/14   Dr. Kay   TONSILLECTOMY     TOTAL KNEE ARTHROPLASTY  09/2009   Right ;Dr Ernie   TOTAL KNEE ARTHROPLASTY Left 03/10/2016   Procedure: TOTAL KNEE ARTHROPLASTY;  Surgeon: Donnice Ernie, MD;  Location: [facility];  Service: Orthopedics;  Laterality: Left;   TRANSTHORACIC  ECHOCARDIOGRAM  09/2012; 09/27/15   2014: EF 60-65%, Gr 1 DD, mild AI, mild bileaflet MVP, mod post directed MR, mild LAE, PASP 35.  Repeat 2017: EF 55-60%, mild AR, mild MVP and mild MV regurg.   VAGINAL HYSTERECTOMY     For Uterine Deviation     Scheduled Meds:  apixaban   5 mg Oral BID   carbidopa -levodopa   1 tablet Oral QHS   carbidopa -levodopa   1 tablet Oral Q24H   carbidopa -levodopa   2 tablet Oral TID   doxycycline  100 mg Oral Q12H   flecainide   100 mg Oral BID   guaiFENesin  600 mg Oral BID   hydrALAZINE  25 mg Oral Q8H   pravastatin   40 mg Oral Daily   sodium chloride  flush  3 mL Intravenous Q12H   sodium chloride  flush  3 mL Intravenous Q12H   traZODone   100 mg Oral QHS   Continuous Infusions:  cefTRIAXone (ROCEPHIN)  IV 2 g (01/28/24 0934)   PRN Meds: acetaminophen  **OR** acetaminophen , ipratropium-albuterol, ondansetron  **OR** ondansetron  (ZOFRAN ) IV, ondansetron  (ZOFRAN ) IV, sodium chloride  flush  Allergies:  Allergies  Allergen Reactions   Sulfa Antibiotics Swelling    face   Tizanidine  Other (See Comments)    delirium    Social History:   Social History   Socioeconomic History   Marital status: Widowed    Spouse name: Not on file   Number of children: 3   Years of education: HS grad   Highest education level: High school graduate  Occupational History   Occupation: Fish farm manager- bus driver- retired  Tobacco Use   Smoking status: Never   Smokeless tobacco: Never  Vaping Use   Vaping status: Never Used  Substance and Sexual Activity   Alcohol use: No    Alcohol/week: 0.0 standard drinks of alcohol   Drug use: No   Sexual activity: Never    Birth control/protection: Post-menopausal  Other Topics Concern   Not on file  Social History Narrative   Widow, has 3 children, 4 grandchildren.   Orig from [place].   Occupation; retired Surveyor, mining.  Also worked in Hydrologist.   Caffiene, 1 cup daily avg.   No tob, no alc, no drugs.    Exercise: walking, limited by bilat hip pain.   Social Drivers of Corporate investment banker Strain: Low Risk  (07/22/2023)   Overall Financial Resource Strain (CARDIA)    Difficulty of Paying Living Expenses: Not hard at all  Food Insecurity: No Food Insecurity (01/27/2024)   Hunger Vital Sign    Worried About Running Out of Food in the Last Year: Never true    Ran Out of Food in the Last Year: Never true  Transportation Needs: Patient Declined (01/27/2024)   PRAPARE - Transportation    Lack of Transportation (Medical): Patient declined    Lack of Transportation (Non-Medical): Patient declined  Physical Activity: Inactive (07/22/2023)   Exercise Vital Sign    Days of Exercise per Week: 0 days    Minutes of Exercise per Session: 0 min  Stress: No Stress Concern Present (07/22/2023)   [employer] of Occupational Health - Occupational Stress Questionnaire    Feeling of Stress : Not at all  Social Connections: Moderately Isolated (07/22/2023)   Social Connection and Isolation Panel    Frequency of Communication with Friends and Family: Twice a week    Frequency of Social Gatherings with Friends and Family: Three times a week    Attends Religious Services: More than 4 times per year    Active Member of Clubs or Organizations: No    Attends Banker Meetings: Never    Marital Status: Widowed  Intimate Partner Violence: Patient Declined (01/27/2024)   Humiliation, Afraid, Rape, and Kick questionnaire    Fear of Current or Ex-Partner: Patient declined    Emotionally Abused: Patient declined    Physically Abused: Patient declined    Sexually Abused: Patient declined    Family History:   Family History  Problem Relation Age of Onset   Heart attack Father        in 90s   Stroke Father 67   Ovarian cancer Mother    Heart attack Son 47       Smoker   Coronary artery disease Brother    COPD Brother    Healthy Son    Healthy Daughter    Diabetes Neg Hx      ROS:   Please see the history of present illness.  All other ROS reviewed and negative.     Physical Exam/Data: Vitals:   01/28/24 1248  01/28/24 1250 01/28/24 1506 01/28/24 1544  BP: 97/72 97/72 (!) 137/109 120/72  Pulse: 80 80  84  Resp: 13 15  19   Temp: 97.8 F (36.6 C)   98 F (36.7 C)  TempSrc: Oral   Oral  SpO2: 97% 97%  91%  Weight:      Height:        Intake/Output Summary (Last 24 hours) at 01/28/2024 1741 Last data filed at 01/28/2024 1500 Gross per 24 hour  Intake --  Output 400 ml  Net -400 ml      01/26/2024   11:00 PM 01/26/2024    5:03 PM 01/25/2024   11:31 AM  Last 3 Weights  Weight (lbs) 141 lb 8.6 oz 134 lb 0.6 oz 134 lb  Weight (kg) 64.2 kg 60.8 kg 60.782 kg     Body mass index is 26.74 kg/m.  General:  Well nourished, well developed, in no acute distress.  Coughing HEENT: normal Neck: no JVD Vascular: No carotid bruits; Distal pulses 2+ bilaterally Cardiac:  normal S1, S2; RRR; 2/6 mumur apex Lungs: Diffuse rhonchi Abd: soft, nontender, no hepatomegaly  Ext: no edema Musculoskeletal:  No deformities, BUE and BLE strength normal and equal Skin: warm and dry  Neuro:  CNs 2-12 intact, no focal abnormalities noted Psych:  Normal affect   EKG:  The EKG was personally reviewed and demonstrates: Artifact noted all throughout.  Suspect that this is atrial fibrillation, appears to be irregular.  Heart rate in the 90s. Telemetry:  Telemetry was personally reviewed and demonstrates: Paroxysmal atrial fibrillation.  Had about 1 to 2 hours of atrial fibrillation with heart rates in the 120s to 130 range.  Currently maintaining sinus rhythm  Relevant CV Studies: Echocardiogram 01/27/2023  1. Left ventricular ejection fraction, by estimation, is 60 to 65%. The  left ventricle has normal function. The left ventricle has no regional  wall motion abnormalities. There is mild left ventricular hypertrophy.  Left ventricular diastolic parameters  are indeterminate. The  average left ventricular global longitudinal strain  is 15.8 %. The global longitudinal strain is abnormal.   2. Right ventricular systolic function is normal. The right ventricular  size is normal. There is moderately elevated pulmonary artery systolic  pressure. The estimated right ventricular systolic pressure is 58.4 mmHg.   3. Left atrial size was moderately dilated.   4. Right atrial size was mildly dilated.   5. There is severe 4+ mitral regurgitation around A3/P3. There is  calcification & thickening of the mitral valve leaflets with mild annular  calcification. There is mild prolapse. Consider TEE for further  evaluation.   6. Tricuspid valve regurgitation is moderate to severe.   7. The aortic valve is normal in structure. There is mild calcification  of the aortic valve. Aortic valve regurgitation is mild. No aortic  stenosis is present.   8. The inferior vena cava is normal in size with greater than 50%  respiratory variability, suggesting right atrial pressure of 3 mmHg.   Laboratory Data: High Sensitivity Troponin:   Recent Labs  Lab 01/26/24 1645 01/26/24 1818  TROPONINIHS 25* 25*     Chemistry Recent Labs  Lab 01/26/24 1645 01/27/24 0241  NA 137 136  K 3.8 4.2  CL 101 103  CO2 21* 23  GLUCOSE 129* 195*  BUN 12 11  CREATININE 0.76 0.79  CALCIUM  8.6* 8.7*  GFRNONAA >60 >60  ANIONGAP 15 10    No results for input(s): PROT, ALBUMIN, AST, ALT, ALKPHOS,  BILITOT in the last 168 hours. Lipids No results for input(s): CHOL, TRIG, HDL, LABVLDL, LDLCALC, CHOLHDL in the last 168 hours.  Hematology Recent Labs  Lab 01/26/24 1645 01/27/24 0241  WBC 6.0 4.3  RBC 4.01 3.97  HGB 12.5 12.4  HCT 38.7 37.4  MCV 96.5 94.2  MCH 31.2 31.2  MCHC 32.3 33.2  RDW 13.1 13.2  PLT 101* 107*   Thyroid  No results for input(s): TSH, FREET4 in the last 168 hours.  BNP Recent Labs  Lab 01/26/24 1645  BNP 338.0*    DDimer No results for  input(s): DDIMER in the last 168 hours.  Radiology/Studies:  ECHOCARDIOGRAM COMPLETE Result Date: 01/27/2024    ECHOCARDIOGRAM REPORT   Patient Name:   Mariann A Apt Date of Exam: 01/27/2024 Medical Rec #:  [id number]       Height:       61.0 in Accession #:    [id number]      Weight:       141.5 lb Date of Birth:  Dec 14, 1940        BSA:          1.631 m Patient Age:    82 years        BP:           134/77 mmHg Patient Gender: F               HR:           72 bpm. Exam Location:  Inpatient Procedure: 2D Echo, Strain Analysis, Cardiac Doppler and Color Doppler (Both            Spectral and Color Flow Doppler were utilized during procedure). Indications:    Elevated troponin  History:        Patient has prior history of Echocardiogram examinations, most                 recent 09/27/2015. Mitral Valve Prolapse and Mitral                 Regurgitation; Risk Factors:Dyslipidemia and Hypertension.  Sonographer:    Koleen Popper RDCS Referring Phys: [phone] SUBRINA SUNDIL  Sonographer Comments: Global longitudinal strain was attempted. IMPRESSIONS  1. Left ventricular ejection fraction, by estimation, is 60 to 65%. The left ventricle has normal function. The left ventricle has no regional wall motion abnormalities. There is mild left ventricular hypertrophy. Left ventricular diastolic parameters are indeterminate. The average left ventricular global longitudinal strain is 15.8 %. The global longitudinal strain is abnormal.  2. Right ventricular systolic function is normal. The right ventricular size is normal. There is moderately elevated pulmonary artery systolic pressure. The estimated right ventricular systolic pressure is 58.4 mmHg.  3. Left atrial size was moderately dilated.  4. Right atrial size was mildly dilated.  5. There is severe 4+ mitral regurgitation around A3/P3. There is calcification & thickening of the mitral valve leaflets with mild annular calcification. There is mild prolapse. Consider TEE for  further evaluation.  6. Tricuspid valve regurgitation is moderate to severe.  7. The aortic valve is normal in structure. There is mild calcification of the aortic valve. Aortic valve regurgitation is mild. No aortic stenosis is present.  8. The inferior vena cava is normal in size with greater than 50% respiratory variability, suggesting right atrial pressure of 3 mmHg. FINDINGS  Left Ventricle: Left ventricular ejection fraction, by estimation, is 60 to 65%. The left ventricle has normal function. The left ventricle has no regional  wall motion abnormalities. The average left ventricular global longitudinal strain is 15.8 %. Strain was performed and the global longitudinal strain is abnormal. The left ventricular internal cavity size was normal in size. There is mild left ventricular hypertrophy. Left ventricular diastolic parameters are indeterminate. Right Ventricle: The right ventricular size is normal. No increase in right ventricular wall thickness. Right ventricular systolic function is normal. There is moderately elevated pulmonary artery systolic pressure. The tricuspid regurgitant velocity is 3.55 m/s, and with an assumed right atrial pressure of 8 mmHg, the estimated right ventricular systolic pressure is 58.4 mmHg. Left Atrium: Left atrial size was moderately dilated. Right Atrium: Right atrial size was mildly dilated. Pericardium: There is no evidence of pericardial effusion. Mitral Valve: The mitral valve is normal in structure. There is mild prolapse of of the mitral valve. There is mild thickening of the mitral valve leaflet(s). Mild mitral annular calcification. Severe mitral valve regurgitation. No evidence of mitral valve stenosis. Tricuspid Valve: The tricuspid valve is normal in structure. Tricuspid valve regurgitation is moderate to severe. No evidence of tricuspid stenosis. Aortic Valve: The aortic valve is normal in structure. There is mild calcification of the aortic valve. There is mild  aortic valve annular calcification. Aortic valve regurgitation is mild. No aortic stenosis is present. Pulmonic Valve: The pulmonic valve was normal in structure. Pulmonic valve regurgitation is not visualized. No evidence of pulmonic stenosis. Aorta: The aortic root is normal in size and structure. Venous: The inferior vena cava is normal in size with greater than 50% respiratory variability, suggesting right atrial pressure of 3 mmHg. IAS/Shunts: No atrial level shunt detected by color flow Doppler. Additional Comments: 3D was performed not requiring image post processing on an independent workstation and was indeterminate.  LEFT VENTRICLE PLAX 2D LVIDd:         4.60 cm   Diastology LVIDs:         2.90 cm   LV e' medial:    6.53 cm/s LV PW:         1.20 cm   LV E/e' medial:  18.5 LV IVS:        1.30 cm   LV e' lateral:   12.40 cm/s LVOT diam:     2.00 cm   LV E/e' lateral: 9.8 LV SV:         68 LV SV Index:   41        2D Longitudinal Strain LVOT Area:     3.14 cm  2D Strain GLS Avg:     15.8 %  RIGHT VENTRICLE             IVC RV Basal diam:  3.80 cm     IVC diam: 2.15 cm RV Mid diam:    2.60 cm RV S prime:     17.40 cm/s TAPSE (M-mode): 1.8 cm LEFT ATRIUM             Index        RIGHT ATRIUM           Index LA diam:        4.90 cm 3.00 cm/m   RA Area:     12.20 cm LA Vol (A2C):   79.5 ml 48.75 ml/m  RA Volume:   25.20 ml  15.45 ml/m LA Vol (A4C):   78.2 ml 47.96 ml/m LA Biplane Vol: 83.9 ml 51.45 ml/m  AORTIC VALVE LVOT Vmax:   92.80 cm/s LVOT Vmean:  66.000 cm/s LVOT VTI:  0.215 m  AORTA Ao Root diam: 3.10 cm Ao Asc diam:  3.20 cm MITRAL VALVE                TRICUSPID VALVE MV Area (PHT): 3.72 cm     TR Peak grad:   50.4 mmHg MV Decel Time: 204 msec     TR Vmax:        355.00 cm/s MV E velocity: 121.00 cm/s MV A velocity: 81.20 cm/s   SHUNTS MV E/A ratio:  1.49         Systemic VTI:  0.22 m                             Systemic Diam: 2.00 cm Aditya Sabharwal Electronically signed by Ria Commander  Signature Date/Time: 01/27/2024/3:29:23 PM    Final    CT HEAD WO CONTRAST Result Date: 01/26/2024 CLINICAL DATA:  Mental status change EXAM: CT HEAD WITHOUT CONTRAST TECHNIQUE: Contiguous axial images were obtained from the base of the skull through the vertex without intravenous contrast. RADIATION DOSE REDUCTION: This exam was performed according to the departmental dose-optimization program which includes automated exposure control, adjustment of the mA and/or kV according to patient size and/or use of iterative reconstruction technique. COMPARISON:  CT head 03/17/2023. FINDINGS: Brain: No acute intracranial hemorrhage. No CT evidence of acute infarct. Nonspecific hypoattenuation in the periventricular and subcortical white matter favored to reflect chronic microvascular ischemic changes. No edema, mass effect, or midline shift. The basilar cisterns are patent. Ventricles: Prominence of the ventricles suggesting underlying parenchymal volume loss. Vascular: Atherosclerotic calcifications of the carotid siphons and intracranial vertebral arteries. No hyperdense vessel. Skull: No acute or aggressive finding. Orbits: Bilateral lens replacement. Orbits are otherwise unremarkable. Sinuses: Mucosal thickening throughout the paranasal sinuses with air-fluid levels in the bilateral maxillary sinuses. Other: Mastoid air cells are clear. IMPRESSION: No CT evidence of acute intracranial abnormality. Paranasal sinus disease with air-fluid levels in the bilateral maxillary sinuses. Recommend clinical correlation for acute sinusitis. Chronic microvascular ischemic changes and mild parenchymal volume loss. Electronically Signed   By: Donnice Mania M.D.   On: 01/26/2024 19:28   CT Angio Chest PE W and/or Wo Contrast Result Date: 01/26/2024 CLINICAL DATA:  Pulmonary embolism (PE) suspected, high prob EXAM: CT ANGIOGRAPHY CHEST WITH CONTRAST TECHNIQUE: Multidetector CT imaging of the chest was performed using the standard  protocol during bolus administration of intravenous contrast. Multiplanar CT image reconstructions and MIPs were obtained to evaluate the vascular anatomy. RADIATION DOSE REDUCTION: This exam was performed according to the departmental dose-optimization program which includes automated exposure control, adjustment of the mA and/or kV according to patient size and/or use of iterative reconstruction technique. CONTRAST:  75mL OMNIPAQUE IOHEXOL 350 MG/ML SOLN COMPARISON:  None Available. FINDINGS: Cardiovascular: No filling defects in the pulmonary arteries to suggest pulmonary emboli. Heart is normal size. Aorta is normal caliber. Moderate aortic atherosclerosis. Scattered coronary artery calcifications. Mediastinum/Nodes: No mediastinal, hilar, or axillary adenopathy. Trachea and esophagus are unremarkable. Thyroid  unremarkable. Lungs/Pleura: Nodular airspace opacity noted in the lingula as well as bilateral lower lobes. This likely reflects multifocal pneumonia. Bibasilar atelectasis. No effusions. Upper Abdomen: No acute findings Musculoskeletal: Chest wall soft tissues are unremarkable. No acute bony abnormality. Review of the MIP images confirms the above findings. IMPRESSION: Nodular airspace disease noted in the lingula and both lower lobes, right greater than left concerning for pneumonia. No evidence of pulmonary embolus. Aortic Atherosclerosis (ICD10-I70.0). Electronically Signed  By: Franky Crease M.D.   On: 01/26/2024 19:26   DG Chest Port 1 View Result Date: 01/26/2024 CLINICAL DATA:  Shortness of breath EXAM: PORTABLE CHEST 1 VIEW COMPARISON:  Chest x-ray 01/25/2024 FINDINGS: The heart size and mediastinal contours are within normal limits. Both lungs are clear. Right shoulder arthroplasty is present. IMPRESSION: No active disease. Electronically Signed   By: Greig Pique M.D.   On: 01/26/2024 17:18   DG Chest 2 View Result Date: 01/25/2024 CLINICAL DATA:  ShOB EXAM: CHEST - 2 VIEW COMPARISON:   03/17/2023 FINDINGS: Lungs are clear. Subcentimeter calcified granuloma in the posterior right lower lobe. Heart size and mediastinal contours are within normal limits. Aortic Atherosclerosis (ICD10-170.0). No effusion. Compression deformity T12 stable since 04/21/2021. IMPRESSION: No acute cardiopulmonary disease. Electronically Signed   By: JONETTA Faes M.D.   On: 01/25/2024 13:21     Assessment and Plan:  Severe mitral regurgitation Likely chronic, however last echocardiogram was in March 2017 demonstrating mild MR.  This admission echo showing preserved EF 60 to 65% with severe MR. Normal RV function, RVSP 58.4.  Mild MAC with mild prolapse.  Moderate to severe TR.  Fortunately does not seem to be in CHF exacerbation.  Overall seems to be asymptomatic from this and decently functional PTA. Discussed with MD, potentially valve may be amenable to clipping.  But she does have progressive Parkinson's and active infection right now so we will need to reevaluate outpatient whether she would be a candidate for intervention. Eventually will need to consider TEE but with her pneumonia right now this can wait.  Paroxysmal atrial fibrillation Generally maintains sinus rhythm.  This admission she has had paroxysmal episode, had RVR episode for probably 1 to 2 hours with heart rates in the low 120s to 130s.  In sinus rhythm now.  Likely related to her valvular disease. Continue with Eliquis  5 mg twice daily.  Need to monitor weight and renal function closely for reduced dosing. Likely okay to continue her flecainide  100 mg twice daily as long as she does not develop any significant heart failure Had borderline prolonged QTc on EKG with significant artifact.  Will repeat now that she is in sinus rhythm. Metoprolol  held due to soft blood pressure.  Chronic thrombocytopenia Further workup per primary team.  Currently platelets are around low 100s.  Need to monitor this closely.  Pneumonia  (+metapneumovirus) Parkinson's Confusion Further management per primary team   Risk Assessment/Risk Scores:  CHA2DS2-VASc Score = 4  This indicates a 4.8% annual risk of stroke. The patient's score is based upon: CHF History: 0 HTN History: 1 Diabetes History: 0 Stroke History: 0 Vascular Disease History: 0 Age Score: 2 Gender Score: 1   For questions or updates, please contact [facility] Please consult [email or web address] for contact info under    Signed, Thom LITTIE Sluder, PA-C  01/28/2024 5:41 PM   I have seen and examined the patient along with Thom LITTIE Sluder, PA-C.  I have reviewed the chart, notes and new data.  I agree with PA/NP's note.  Key new complaints: Sleepy right now.  Recently has had declining functional status due to Parkinson's disease and possibly dementia.  Dyspnea on exertion has not been a big complaint. Key examination changes: Regular rate and rhythm, holosystolic murmur at the apex radiating towards the axilla. Key new findings / data: Reviewed the echocardiogram.  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  There is severe left atrial dilation, but the left ventricle is not dilated (end-systolic diameter is only 29 mm) and left ventricular systolic function is normal at around 65%.  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  PLAN: Mrs. Beaird has severe mitral insufficiency and even though she does not have overt heart failure, there is evidence that the mitral insufficiency is hemodynamically important with some evidence for decompensation (moderate pulmonary hypertension, atrial fibrillation increasing in frequency).  Due to her comorbid conditions she would not be a candidate for  mitral valve surgical repair, but at least at first glance may be a candidate for TEER (mitraclip).  Further evaluation would require transesophageal echocardiography.  Would plan to discuss this with her as an outpatient, after she fully recovers from the current respiratory infection.  It is quite possible that in her individual case best management will remain conservative, with diuretics and antiarrhythmics.  If frank heart failure develops, may need to switch from flecainide  to amiodarone.  Jeson Camacho, MD, FACC [facility] [phone] 01/28/2024, 6:41 PM

## 2024-01-28 NOTE — Telephone Encounter (Signed)
 Reason for CRM: Patients daughter Cassie White, called in stating her mother is in the hospital and very sick, they would like a DNR order put in place for the patient. Daughter has POA. Please contact Cassie White back at [phone]   Spoke to patient's daughter, Cassie White. Patient is currently admitted to [facility]. Cassie White was directed to reach out to Child psychotherapist at hospital for DNR. They will have all info and forms there. She understood and will ask nurse when she visits Mother today.

## 2024-01-28 NOTE — Evaluation (Signed)
 Occupational Therapy Evaluation Patient Details Name: Cassie White MRN: [id number] DOB: [birth year] Today's Date: 01/28/2024   History of Present Illness   Pt is 83 yo presenting to [facility] ED due to confusion, cough and shortness of breathe with R sided weakness which seems to have resolved. Pt admitted for acute hypoxic respiratory failure secondary to CAP. PMH: paroxysmal a-fib, mitral regurgitation, hyperlipidemia, GERD, osteoporosis, HTN, chronic thrombocytopenia, GERD and parkinson's disease.     Clinical Impressions Pt presents with decline in function and safety with ADLs and ADL mobility with impaired strength, balance, endurance and coordination(tremors R hand). PTA pt lived with her daughter and was Mod I with ADLs and used rollater for mobility. Pt currently requires max A with LB ADLs, CGA with UB ADLs, max A with toileting and max A with mobility/transfers. Pt and her daughter planning for pt to d/c to SNF for post acute rehab before returning home. Pt would benefit from acute OT services to maximize level of function and safety     If plan is discharge home, recommend the following:   A lot of help with bathing/dressing/bathroom;A lot of help with walking and/or transfers;Assistance with cooking/housework;Assist for transportation;Help with stairs or ramp for entrance;Direct supervision/assist for medications management     Functional Status Assessment   Patient has had a recent decline in their functional status and demonstrates the ability to make significant improvements in function in a reasonable and predictable amount of time.     Equipment Recommendations   Other (comment) (defer)     Recommendations for Other Services         Precautions/Restrictions   Precautions Precautions: Fall Recall of Precautions/Restrictions: Intact Restrictions Weight Bearing Restrictions Per Provider Order: No     Mobility Bed Mobility Overal bed mobility: Needs  Assistance Bed Mobility: Sit to Supine     Supine to sit: Min assist     General bed mobility comments: pt on BSC upon arrival. assiste dpt back to bed at end of session, mod A with LE mgt back onto bed    Transfers Overall transfer level: Needs assistance Equipment used: Rolling walker (2 wheels), None Transfers: Sit to/from Stand, Bed to chair/wheelchair/BSC Sit to Stand: Max assist Stand pivot transfers: Max assist         General transfer comment: max A from BSC to stand at EOB, SPT to to to EOB, max A sit - stand from bed with RW to side step along EOB      Balance Overall balance assessment: Needs assistance Sitting-balance support: Single extremity supported Sitting balance-Leahy Scale: Fair     Standing balance support: Bilateral upper extremity supported, During functional activity Standing balance-Leahy Scale: Poor Standing balance comment: Max A for standing balance.                           ADL either performed or assessed with clinical judgement   ADL Overall ADL's : Needs assistance/impaired Eating/Feeding: Set up;Sitting   Grooming: Wash/dry hands;Wash/dry face;Oral care;Contact guard assist;Sitting   Upper Body Bathing: Minimal assistance;Sitting   Lower Body Bathing: Maximal assistance   Upper Body Dressing : Minimal assistance;Sitting   Lower Body Dressing: Maximal assistance   Toilet Transfer: Maximal assistance;BSC/3in1;Cueing for safety;Cueing for sequencing   Toileting- Clothing Manipulation and Hygiene: Maximal assistance;Sitting/lateral lean;Sit to/from stand       Functional mobility during ADLs: Maximal assistance;Cueing for safety;Cueing for sequencing       Vision Baseline Vision/History: 1  Wears glasses Ability to See in Adequate Light: 0 Adequate Patient Visual Report: No change from baseline       Perception         Praxis         Pertinent Vitals/Pain Pain Assessment Pain Assessment: No/denies pain      Extremity/Trunk Assessment Upper Extremity Assessment Upper Extremity Assessment: Generalized weakness   Lower Extremity Assessment Lower Extremity Assessment: Defer to PT evaluation   Cervical / Trunk Assessment Cervical / Trunk Assessment: Normal   Communication Communication Communication: Impaired Factors Affecting Communication: Hearing impaired   Cognition Arousal: Alert Behavior During Therapy: WFL for tasks assessed/performed                                 Following commands: Intact       Cueing  General Comments   Cueing Techniques: Verbal cues  Daughter present during session. Difficulty getting O2 reading during session   Exercises     Shoulder Instructions      Home Living Family/patient expects to be discharged to:: Private residence Living Arrangements: Children Available Help at Discharge: Family;Available 24 hours/day Type of Home: House Home Access: Ramped entrance     Home Layout: Multi-level;Able to live on main level with bedroom/bathroom     Bathroom Shower/Tub: Arts development officer Toilet: Handicapped height     Home Equipment: Agricultural consultant (2 wheels);Toilet riser;Transport chair;Shower seat - built in          Prior Functioning/Environment Prior Level of Function : Needs assist;Independent/Modified Independent;History of Falls (last six months)  Cognitive Assist : ADLs (cognitive)   ADLs (Cognitive): Set up cues       Mobility Comments: uses RW in home and transport chair in the community. ADLs Comments: Mod I with ADL's, medication mgt daughter helps with,Family helps with transportation    OT Problem List: Decreased strength;Decreased knowledge of use of DME or AE;Decreased activity tolerance;Impaired balance (sitting and/or standing)   OT Treatment/Interventions: Self-care/ADL training;Therapeutic exercise;Patient/family education;Balance training;Therapeutic activities;DME and/or AE instruction       OT Goals(Current goals can be found in the care plan section)   Acute Rehab OT Goals Patient Stated Goal: go home OT Goal Formulation: With patient/family Time For Goal Achievement: 02/11/24 Potential to Achieve Goals: Good ADL Goals Pt Will Perform Grooming: with supervision;with set-up;sitting Pt Will Perform Upper Body Bathing: with contact guard assist;with supervision;sitting Pt Will Perform Lower Body Bathing: with mod assist Pt Will Perform Upper Body Dressing: with contact guard assist;with supervision;sitting Pt Will Transfer to Toilet: with mod assist;with min assist;stand pivot transfer;bedside commode Pt Will Perform Toileting - Clothing Manipulation and hygiene: with mod assist;sitting/lateral leans;sit to/from stand Additional ADL Goal #1: pt will verbalize and demo 3 energy conservation techniques for ADLs and ADL  mobility tasks   OT Frequency:  Min 2X/week    Co-evaluation              AM-PAC OT 6 Clicks Daily Activity     Outcome Measure Help from another person eating meals?: A Little Help from another person taking care of personal grooming?: A Little Help from another person toileting, which includes using toliet, bedpan, or urinal?: A Lot Help from another person bathing (including washing, rinsing, drying)?: A Lot Help from another person to put on and taking off regular upper body clothing?: A Lot Help from another person to put on and taking off regular lower  body clothing?: A Lot 6 Click Score: 14   End of Session Equipment Utilized During Treatment: Gait belt;Rolling walker (2 wheels);Other (comment) [facility]) Nurse Communication: Mobility status  Activity Tolerance: Patient limited by fatigue Patient left: in bed;with call bell/phone within reach;with family/visitor present  OT Visit Diagnosis: Unsteadiness on feet (R26.81);Other abnormalities of gait and mobility (R26.89);History of falling (Z91.81)                Time: 8596-8570 OT  Time Calculation (min): 26 min Charges:  OT General Charges $OT Visit: 1 Visit OT Evaluation $OT Eval Moderate Complexity: 1 Mod OT Treatments $Therapeutic Activity: 8-22 mins    Jacques Karna Loose 01/28/2024, 3:05 PM

## 2024-01-28 NOTE — NC FL2 (Signed)
 [place]  MEDICAID FL2 LEVEL OF CARE FORM     IDENTIFICATION  Patient Name: Cassie White Birthdate: 03/23/1941 Sex: female Admission Date (Current Location): 01/26/2024  [facility] and [place] Number:  Producer, television/film/video and Address:  [facility] [place], [address], [place], [place] [address]      Provider Number: [id number]  Attending Physician Name and Address:  Leotis Bogus, MD  Relative Name and Phone Number:  Emmer Race (Daughter)  [phone]    Current Level of Care: Hospital Recommended Level of Care: Skilled Nursing Facility Prior Approval Number:    Date Approved/Denied:   PASRR Number: [id number]  Discharge Plan: SNF    Current Diagnoses: Patient Active Problem List   Diagnosis Date Noted   Acute respiratory failure with hypoxia (HCC) 01/26/2024   CAP (community acquired pneumonia) 01/26/2024   Acute metabolic encephalopathy 01/26/2024   Elevated troponin 01/26/2024   Chronic idiopathic thrombocytopenia (HCC) 01/26/2024   GERD (gastroesophageal reflux disease) 01/26/2024   History of Parkinson disease 01/26/2024   Elevated brain natriuretic peptide (BNP) level 01/26/2024   Aftercare 04/25/2019   S/P shoulder replacement, 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   Pain in joint of right shoulder 01/10/2019   Pain in both feet 12/14/2017   Neuropathy 12/14/2017   Benign essential tremor 12/14/2017   Paroxysmal atrial fibrillation (HCC) 12/14/2017   Chronic low back pain without sciatica 12/14/2017   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   Snoring 05/19/2017   S/P knee replacement 03/10/2016   Female bladder prolapse 12/06/2015   Paroxysmal atrial fibrillation with rapid ventricular response (HCC) 09/28/2015   Maxillary sinusitis, acute 08/27/2015   Mitral insufficiency 05/12/2014   Essential hypertension 04/28/2014   Lactic acidosis 09/07/2013   Mitral valve prolapse 06/14/2013   Chest pain, exertional 09/13/2012   Pernicious anemia  09/07/2012   DIVERTICULOSIS, COLON 09/06/2009   Osteoporosis 09/06/2009   Hyperlipidemia 09/04/2009   Peripheral sensory neuropathy 09/04/2009   QT prolongation 09/04/2009   LOW BACK PAIN, CHRONIC 04/08/2007    Orientation RESPIRATION BLADDER Height & Weight     Self, Situation, Place, Time  O2 (3L) Incontinent, External catheter Weight: 141 lb 8.6 oz (64.2 kg) Height:  5' 1 (154.9 cm)  BEHAVIORAL SYMPTOMS/MOOD NEUROLOGICAL BOWEL NUTRITION STATUS      Incontinent    AMBULATORY STATUS COMMUNICATION OF NEEDS Skin   Extensive Assist Verbally Other (Comment)                       Personal Care Assistance Level of Assistance  Bathing, Feeding, Dressing Bathing Assistance: Maximum assistance Feeding assistance: Limited assistance Dressing Assistance: Maximum assistance     Functional Limitations Info  Sight, Hearing, Speech Sight Info: Adequate Hearing Info: Adequate Speech Info: Adequate    SPECIAL CARE FACTORS FREQUENCY  PT (By licensed PT), OT (By licensed OT)     PT Frequency: 5x/week OT Frequency: 5x/week            Contractures Contractures Info: Not present    Additional Factors Info  Code Status, Allergies Code Status Info: FULL Allergies Info: Sulfa Antibiotics  Tizanidine            Current Medications (01/28/2024):  This is the current hospital active medication list Current Facility-Administered Medications  Medication Dose Route Frequency Provider Last Rate Last Admin   acetaminophen  (TYLENOL ) tablet 650 mg  650 mg Oral Q6H PRN Sundil, Subrina, MD   650 mg at 01/27/24 0641   Or   acetaminophen  (TYLENOL )  suppository 650 mg  650 mg Rectal Q6H PRN Sundil, Subrina, MD       apixaban  (ELIQUIS ) tablet 5 mg  5 mg Oral BID Sundil, Subrina, MD   5 mg at 01/28/24 0932   carbidopa -levodopa  (SINEMET  CR) 50-200 MG per tablet controlled release 1 tablet  1 tablet Oral QHS Jerrol Agent, MD   1 tablet at 01/27/24 2232   carbidopa -levodopa  (SINEMET  IR)  25-100 MG per tablet immediate release 1 tablet  1 tablet Oral Q24H Sundil, Subrina, MD   1 tablet at 01/27/24 1747   carbidopa -levodopa  (SINEMET  IR) 25-100 MG per tablet immediate release 2 tablet  2 tablet Oral TID Sundil, Subrina, MD   2 tablet at 01/28/24 1224   cefTRIAXone (ROCEPHIN) 2 g in sodium chloride  0.9 % 100 mL IVPB  2 g Intravenous Q24H Sundil, Subrina, MD 200 mL/hr at 01/28/24 0934 2 g at 01/28/24 0934   doxycycline (VIBRA-TABS) tablet 100 mg  100 mg Oral Q12H Bitonti, Michael T, RPH   100 mg at 01/28/24 9068   flecainide  (TAMBOCOR ) tablet 100 mg  100 mg Oral BID Sundil, Subrina, MD   100 mg at 01/28/24 0932   guaiFENesin (MUCINEX) 12 hr tablet 600 mg  600 mg Oral BID Sundil, Subrina, MD   600 mg at 01/28/24 0931   hydrALAZINE (APRESOLINE) tablet 25 mg  25 mg Oral Q8H Khatri, Pardeep, MD   25 mg at 01/28/24 0931   ipratropium-albuterol (DUONEB) 0.5-2.5 (3) MG/3ML nebulizer solution 3 mL  3 mL Nebulization Q6H PRN Sundil, Subrina, MD   3 mL at 01/27/24 2253   ondansetron  (ZOFRAN ) tablet 4 mg  4 mg Oral Q6H PRN Sundil, Subrina, MD       Or   ondansetron  (ZOFRAN ) injection 4 mg  4 mg Intravenous Q6H PRN Sundil, Subrina, MD       ondansetron  (ZOFRAN ) injection 4 mg  4 mg Intravenous Q6H PRN Sundil, Subrina, MD       pravastatin  (PRAVACHOL ) tablet 40 mg  40 mg Oral Daily Sundil, Subrina, MD   40 mg at 01/27/24 1747   sodium chloride  flush (NS) 0.9 % injection 3 mL  3 mL Intravenous Q12H Sundil, Subrina, MD   3 mL at 01/28/24 0936   sodium chloride  flush (NS) 0.9 % injection 3 mL  3 mL Intravenous Q12H Sundil, Subrina, MD   3 mL at 01/28/24 9063   sodium chloride  flush (NS) 0.9 % injection 3 mL  3 mL Intravenous PRN Sundil, Subrina, MD       traZODone  (DESYREL ) tablet 100 mg  100 mg Oral QHS Sundil, Subrina, MD   100 mg at 01/27/24 2232     Discharge Medications: Please see discharge summary for a list of discharge medications.  Relevant Imaging Results:  Relevant Lab  Results:   Additional Information SSN: [id number]  Christeen Lai A [name], LCSW

## 2024-01-29 ENCOUNTER — Ambulatory Visit: Admitting: Podiatry

## 2024-01-29 DIAGNOSIS — J189 Pneumonia, unspecified organism: Secondary | ICD-10-CM | POA: Diagnosis not present

## 2024-01-29 LAB — BASIC METABOLIC PANEL WITH GFR
Anion gap: 9 (ref 5–15)
BUN: 11 mg/dL (ref 8–23)
CO2: 27 mmol/L (ref 22–32)
Calcium: 8.9 mg/dL (ref 8.9–10.3)
Chloride: 100 mmol/L (ref 98–111)
Creatinine, Ser: 0.67 mg/dL (ref 0.44–1.00)
GFR, Estimated: >60 mL/min (ref >60)
Glucose, Bld: 100 mg/dL — ABNORMAL HIGH (ref 70–99)
Potassium: 4.1 mmol/L (ref 3.5–5.1)
Sodium: 136 mmol/L (ref 135–145)

## 2024-01-29 LAB — MAGNESIUM: Magnesium: 2.2 mg/dL (ref 1.7–2.4)

## 2024-01-29 LAB — TSH: TSH: 2.776 u[IU]/mL (ref 0.350–4.500)

## 2024-01-29 MED ORDER — METOPROLOL TARTRATE 25 MG PO TABS
25.0000 mg | ORAL_TABLET | Freq: Two times a day (BID) | ORAL | Status: DC
  Administered 2024-01-29 – 2024-02-02 (×8): 25 mg via ORAL
  Filled 2024-01-29 (×8): qty 1

## 2024-01-29 MED ORDER — METOPROLOL TARTRATE 50 MG PO TABS
50.0000 mg | ORAL_TABLET | Freq: Two times a day (BID) | ORAL | Status: DC
  Administered 2024-01-29: 50 mg via ORAL
  Filled 2024-01-29: qty 1

## 2024-01-29 MED ORDER — METOPROLOL TARTRATE 25 MG PO TABS: 25.0000 mg | ORAL_TABLET | Freq: Two times a day (BID) | ORAL | Status: DC

## 2024-01-29 MED ORDER — DILTIAZEM HCL-DEXTROSE 125-5 MG/125ML-% IV SOLN (PREMIX)
5.0000 mg/h | INTRAVENOUS | Status: DC
  Filled 2024-01-29: qty 125

## 2024-01-29 MED ORDER — DILTIAZEM LOAD VIA INFUSION
10.0000 mg | Freq: Once | INTRAVENOUS | Status: DC
  Filled 2024-01-29: qty 10

## 2024-01-29 NOTE — Progress Notes (Addendum)
 1247 - RN paged PA about pt pauses. Pt is asymptomatic sitting in being conversing with daughter.  1320 PA paged again after RN notified of more pauses. Awaiting return call.  13:32 Spoke with Zane Adams, PA; Verbal order given to hold off on starting Cardizem. PA to see pt.

## 2024-01-29 NOTE — Progress Notes (Signed)
 PROGRESS NOTE    Cassie White  FMW:[id number] DOB: January 26, 1941 DOA: 01/26/2024 PCP: Candise [name], MD   Brief Narrative:  This 83 yrs old female with PMH significant of paroxysmal atrial fibrillation, mitral regurgitation, hyperlipidemia, GERD, osteoporosis, essential hypertension, chronic thrombocytopenia, GERD, and Parkinson disease presented to the ED with confusion this morning, cough and shortness of breath for last few days.  Family also noticed right-sided weakness, Patient unable to feed herself and has missed doses of Sinemet  for last few days.  She was hypoxic with SpO2 of 88% on room air in the ED requiring 3.5 L of supplemental oxygen to maintain saturation above 93%.  CTA chest shows no evidence of PE but shows nodular airspace disease concerning for pneumonia. Family reports patient initially had left-sided upper extremity weakness which resolved in the ED.  EDP initially approached neurologist Dr. Jerrie who states symptoms are not related to TIA,  there is no need to pursue TIA workup.  Patient was admitted for acute hypoxic respiratory failure secondary to community-acquired pneumonia and started on empiric antibiotics.  Assessment & Plan:   Principal Problem:   CAP (community acquired pneumonia) Active Problems:   Acute respiratory failure with hypoxia (HCC)   Acute metabolic encephalopathy   Hyperlipidemia   QT prolongation   Essential hypertension   Paroxysmal atrial fibrillation (HCC)   Elevated troponin   Chronic idiopathic thrombocytopenia (HCC)   GERD (gastroesophageal reflux disease)   History of Parkinson disease   Elevated brain natriuretic peptide (BNP) level   Acute hypoxic respiratory failure: Sepsis secondary to pneumonia: Concern for aspiration pneumonia: Patient presented in the ED with productive cough, shortness of breath, confusion, generalized weakness and increasing tremor of both upper extremities. She was hypotensive, tachycardic,  tachypneic and hypoxic in the ED. Chest x-ray unremarkable.  CTA chest showed bilateral lung pneumonia. Patient meets sepsis criteria  (hypotension, tachycardia and having a source of infection). CT head > No acute intracranial abnormality. Initiated on empiric antibiotics IV ceftriaxone  and azithromycin . RVP shows metapneumovirus+, follow-up urine Legionella, sputum culture.   Blood cultures are in process. Continue supplemental oxygen and wean as tolerated. Continue ceftriaxone  and doxycycline . Discontinue droplet precautions.  Elevated BNP: Elevated troponin-secondary to demand ischemia: Elevated BNP around 438.  No previous history of CHF.   Troponin 25 > 25 flat.  EKG showing atrial fibrillation rate controlled.  2D echo shows LVEF 60 to 65%, no regional wall motion abnormalities.  Prolonged Qtc: Underlying prolonged QTc in the setting of chronic Sinemet  use.   At this time unable to discontinue the Sinemet  abruptly.   Need to seen by neurology clinic outpatient for dose adjustment. Continue to monitor electrolytes while patient here in the hospital and continue cardiac monitoring.   Acute metabolic encephalopathy / Confusion - resolved. Family reports that patient was confused and unable to feed herself with right hand due to increased tremor which is causing weakness, generalized weakness and fatigue.  Patient has underlying Parkinson disease and has tremor and bradykinesia at the baseline. Concern for acute metabolic encephalopathy specifically confusion in the setting of acute hypoxic respiratory failure due to pneumonia. CT head > No acute intracranial abnormality. At this time there is no concern for TIA given patient has generalized weakness, increased tremor in the setting of acute hypoxic respiratory failure and missing dose of Sinemet .  Physical exam did not appreciate any evidence of upper and lower extremity weakness and per family patient is at baseline. Dr. Tomma saw  discussed with neurology Dr.  Bhagat  >No concern for TIA at this time and there is no need to pursue TIA workup as well. Continue to treat for pneumonia and hypoxic respiratory failure as mentioned above.   Essential hypertension: Held Lopressor  in the setting of hypotension. Blood pressure now improved, metoprolol  resumed   Paroxysmal atrial fibrillation: Continue flecainide  and Eliquis .   Metoprolol  now resumed.   Heart rate continued to remain elevated.  Cardiology started Cardizem infusion. Continue monitoring telemetry.   Chronic idiopathic thrombocytopenia: Stable platelet count,  continue to monitor.   History of Parkinson's disease: Continue Sinemet .     DVT prophylaxis: Eliquis  Code Status: Full code Family Communication: No family at bed side Disposition Plan:    Status is: Inpatient Remains inpatient appropriate because: Admitted for Acute hypoxic respiratory failure, secondary to community acquired PNA.  Now went into A-fib with RVR.    Consultants:  Neurology  Procedures: CTA Chest  Antimicrobials:  Anti-infectives (From admission, onward)    Start     Dose/Rate Route Frequency Ordered Stop   01/28/24 0708  doxycycline  (VIBRA -TABS) tablet 100 mg        100 mg Oral Every 12 hours 01/28/24 0708 02/03/24 0959   01/27/24 2200  doxycycline  (VIBRAMYCIN ) 100 mg in sodium chloride  0.9 % 250 mL IVPB  Status:  Discontinued        100 mg 125 mL/hr over 120 Minutes Intravenous Every 12 hours 01/26/24 2202 01/28/24 0708   01/27/24 0800  cefTRIAXone  (ROCEPHIN ) 2 g in sodium chloride  0.9 % 100 mL IVPB        2 g 200 mL/hr over 30 Minutes Intravenous Every 24 hours 01/26/24 2202 02/02/24 0959   01/27/24 0000  Ampicillin-Sulbactam (UNASYN) 3 g in sodium chloride  0.9 % 100 mL IVPB  Status:  Discontinued        3 g 200 mL/hr over 30 Minutes Intravenous Every 6 hours 01/26/24 2034 01/26/24 2200   01/26/24 2000  cefTRIAXone  (ROCEPHIN ) 1 g in sodium chloride  0.9 % 100 mL IVPB         1 g 200 mL/hr over 30 Minutes Intravenous  Once 01/26/24 1948 01/26/24 2145   01/26/24 2000  azithromycin  (ZITHROMAX ) 500 mg in sodium chloride  0.9 % 250 mL IVPB        500 mg 250 mL/hr over 60 Minutes Intravenous  Once 01/26/24 1948 01/26/24 2311      Subjective: Patient was seen and examined at bedside.Overnight events noted. Patient reports not feeling well today.  She continued to remain in A-fib with RVR. Patient is back to her baseline mental status.  Patient is started on Cardizem infusion.  Objective: Vitals:   01/29/24 0749 01/29/24 0759 01/29/24 1059 01/29/24 1152  BP:   116/89 (!) 117/56  Pulse:   (!) 122 (!) 133  Resp: (!) 24  (!) 23 18  Temp:   97.8 F (36.6 C)   TempSrc:   Oral   SpO2: 91% 95% 97%   Weight:      Height:        Intake/Output Summary (Last 24 hours) at 01/29/2024 1313 Last data filed at 01/29/2024 0413 Gross per 24 hour  Intake --  Output 1825 ml  Net -1825 ml   Filed Weights   01/26/24 1703 01/26/24 2300  Weight: 60.8 kg 64.2 kg    Examination:  General exam: Appears calm and comfortable, deconditioned, not in any acute distress. Respiratory system: CTA Bilaterally. Respiratory effort normal.  RR 14 Cardiovascular system: S1 & S2  heard, Irregular rhythm. No JVD, murmurs, rubs, gallops or clicks.  Gastrointestinal system: Abdomen is non distended, soft and non tender. Normal bowel sounds heard. Central nervous system: Alert and oriented X 3. No focal neurological deficits. Extremities: No Edema, no cyanosis, no clubbing. Skin: No rashes, lesions or ulcers Psychiatry: Judgement and insight appear normal. Mood & affect appropriate.     Data Reviewed: I have personally reviewed following labs and imaging studies  CBC: Recent Labs  Lab 01/26/24 1645 01/27/24 0241  WBC 6.0 4.3  NEUTROABS 3.7  --   HGB 12.5 12.4  HCT 38.7 37.4  MCV 96.5 94.2  PLT 101* 107*   Basic Metabolic Panel: Recent Labs  Lab 01/26/24 1645  01/27/24 0241  NA 137 136  K 3.8 4.2  CL 101 103  CO2 21* 23  GLUCOSE 129* 195*  BUN 12 11  CREATININE 0.76 0.79  CALCIUM  8.6* 8.7*   GFR: Estimated Creatinine Clearance: 46.6 mL/min (by C-G formula based on SCr of 0.79 mg/dL). Liver Function Tests: No results for input(s): AST, ALT, ALKPHOS, BILITOT, PROT, ALBUMIN in the last 168 hours. No results for input(s): LIPASE, AMYLASE in the last 168 hours. No results for input(s): AMMONIA in the last 168 hours. Coagulation Profile: No results for input(s): INR, PROTIME in the last 168 hours. Cardiac Enzymes: No results for input(s): CKTOTAL, CKMB, CKMBINDEX, TROPONINI in the last 168 hours. BNP (last 3 results) No results for input(s): PROBNP in the last 8760 hours. HbA1C: No results for input(s): HGBA1C in the last 72 hours. CBG: Recent Labs  Lab 01/26/24 1816  GLUCAP 151*   Lipid Profile: No results for input(s): CHOL, HDL, LDLCALC, TRIG, CHOLHDL, LDLDIRECT in the last 72 hours. Thyroid  Function Tests: No results for input(s): TSH, T4TOTAL, FREET4, T3FREE, THYROIDAB in the last 72 hours. Anemia Panel: No results for input(s): VITAMINB12, FOLATE, FERRITIN, TIBC, IRON, RETICCTPCT in the last 72 hours. Sepsis Labs: Recent Labs  Lab 01/26/24 1818  PROCALCITON <0.10    Recent Results (from the past 240 hours)  Resp panel by RT-PCR (RSV, Flu A&B, Covid) Anterior Nasal Swab     Status: None   Collection Time: 01/25/24 11:34 AM   Specimen: Anterior Nasal Swab  Result Value Ref Range Status   SARS Coronavirus 2 by RT PCR NEGATIVE NEGATIVE Final    Comment: (NOTE) SARS-CoV-2 target nucleic acids are NOT DETECTED.  The SARS-CoV-2 RNA is generally detectable in upper respiratory specimens during the acute phase of infection. The lowest concentration of SARS-CoV-2 viral copies this assay can detect is 138 copies/mL. A negative result does not preclude  SARS-Cov-2 infection and should not be used as the sole basis for treatment or other patient management decisions. A negative result may occur with  improper specimen collection/handling, submission of specimen other than nasopharyngeal swab, presence of viral mutation(s) within the areas targeted by this assay, and inadequate number of viral copies(<138 copies/mL). A negative result must be combined with clinical observations, patient history, and epidemiological information. The expected result is Negative.  Fact Sheet for Patients:  BloggerCourse.com  Fact Sheet for Healthcare Providers:  SeriousBroker.it  This test is no t yet approved or cleared by the [place]  FDA and  has been authorized for detection and/or diagnosis of SARS-CoV-2 by FDA under an Emergency Use Authorization (EUA). This EUA will remain  in effect (meaning this test can be used) for the duration of the COVID-19 declaration under Section 564(b)(1) of the Act, 21 U.S.C.section 360bbb-3(b)(1), unless the  authorization is terminated  or revoked sooner.       Influenza A by PCR NEGATIVE NEGATIVE Final   Influenza B by PCR NEGATIVE NEGATIVE Final    Comment: (NOTE) The Xpert Xpress SARS-CoV-2/FLU/RSV plus assay is intended as an aid in the diagnosis of influenza from Nasopharyngeal swab specimens and should not be used as a sole basis for treatment. Nasal washings and aspirates are unacceptable for Xpert Xpress SARS-CoV-2/FLU/RSV testing.  Fact Sheet for Patients: BloggerCourse.com  Fact Sheet for Healthcare Providers: SeriousBroker.it  This test is not yet approved or cleared by the [place]  FDA and has been authorized for detection and/or diagnosis of SARS-CoV-2 by FDA under an Emergency Use Authorization (EUA). This EUA will remain in effect (meaning this test can be used) for the duration of  the COVID-19 declaration under Section 564(b)(1) of the Act, 21 U.S.C. section 360bbb-3(b)(1), unless the authorization is terminated or revoked.     Resp Syncytial Virus by PCR NEGATIVE NEGATIVE Final    Comment: (NOTE) Fact Sheet for Patients: BloggerCourse.com  Fact Sheet for Healthcare Providers: SeriousBroker.it  This test is not yet approved or cleared by the [place]  FDA and has been authorized for detection and/or diagnosis of SARS-CoV-2 by FDA under an Emergency Use Authorization (EUA). This EUA will remain in effect (meaning this test can be used) for the duration of the COVID-19 declaration under Section 564(b)(1) of the Act, 21 U.S.C. section 360bbb-3(b)(1), unless the authorization is terminated or revoked.  Performed at [facility], [address] [place], [place] [address]   Expectorated Sputum Assessment w Gram Stain, Rflx to Resp Cult     Status: None   Collection Time: 01/26/24  8:18 PM   Specimen: Sputum  Result Value Ref Range Status   Specimen Description SPUTUM  Final   Special Requests NONE  Final   Sputum evaluation   Final    Sputum specimen not acceptable for testing.  Please recollect.   Gram Stain Report Called to,Read Back By and Verified With: RN C. FRANKLIN [id number] @ [phone] FH Performed at [facility], [address] [place], [place] [address]    Report Status 01/27/2024 FINAL  Final  Respiratory (~20 pathogens) panel by PCR     Status: Abnormal   Collection Time: 01/26/24  8:45 PM   Specimen: Nasopharyngeal Swab; Respiratory  Result Value Ref Range Status   Adenovirus NOT DETECTED NOT DETECTED Final   Coronavirus 229E NOT DETECTED NOT DETECTED Final    Comment: (NOTE) The Coronavirus on the Respiratory Panel, DOES NOT test for the novel  Coronavirus (2019 nCoV)    Coronavirus HKU1 NOT DETECTED NOT DETECTED Final   Coronavirus NL63 NOT DETECTED NOT DETECTED Final    Coronavirus OC43 NOT DETECTED NOT DETECTED Final   Metapneumovirus DETECTED (A) NOT DETECTED Final   Rhinovirus / Enterovirus NOT DETECTED NOT DETECTED Final   Influenza A NOT DETECTED NOT DETECTED Final   Influenza B NOT DETECTED NOT DETECTED Final   Parainfluenza Virus 1 NOT DETECTED NOT DETECTED Final   Parainfluenza Virus 2 NOT DETECTED NOT DETECTED Final   Parainfluenza Virus 3 NOT DETECTED NOT DETECTED Final   Parainfluenza Virus 4 NOT DETECTED NOT DETECTED Final   Respiratory Syncytial Virus NOT DETECTED NOT DETECTED Final   Bordetella pertussis NOT DETECTED NOT DETECTED Final   Bordetella Parapertussis NOT DETECTED NOT DETECTED Final   Chlamydophila pneumoniae NOT DETECTED NOT DETECTED Final   Mycoplasma pneumoniae NOT DETECTED NOT  DETECTED Final    Comment: Performed at [facility], [address], [place], [place] [address]  Culture, blood (routine x 2) Call MD if unable to obtain prior to antibiotics being given     Status: None (Preliminary result)   Collection Time: 01/26/24  8:55 PM   Specimen: BLOOD  Result Value Ref Range Status   Specimen Description BLOOD RIGHT ANTECUBITAL  Final   Special Requests   Final    BOTTLES DRAWN AEROBIC AND ANAEROBIC Blood Culture adequate volume   Culture   Final    NO GROWTH 3 DAYS Performed at [facility], [address] [place], [place] [address]    Report Status PENDING  Incomplete  MRSA Next Gen by PCR, Nasal     Status: None   Collection Time: 01/26/24 11:05 PM   Specimen: Nasal Mucosa; Nasal Swab  Result Value Ref Range Status   MRSA by PCR Next Gen NOT DETECTED NOT DETECTED Final    Comment: (NOTE) The GeneXpert MRSA Assay (FDA approved for NASAL specimens only), is one component of a comprehensive MRSA colonization surveillance program. It is not intended to diagnose MRSA infection nor to guide or monitor treatment for MRSA infections. Test performance is not FDA approved in patients less than 38  years old. Performed at [facility], [address], [place], [place] [address]     Radiology Studies: ECHOCARDIOGRAM COMPLETE Result Date: 01/27/2024    ECHOCARDIOGRAM REPORT   Patient Name:   Claryce A Barnett Date of Exam: 01/27/2024 Medical Rec #:  [id number]       Height:       61.0 in Accession #:    [id number]      Weight:       141.5 lb Date of Birth:  [birth year]        BSA:          1.631 m Patient Age:    82 years        BP:           134/77 mmHg Patient Gender: F               HR:           72 bpm. Exam Location:  Inpatient Procedure: 2D Echo, Strain Analysis, Cardiac Doppler and Color Doppler (Both            Spectral and Color Flow Doppler were utilized during procedure). Indications:    Elevated troponin  History:        Patient has prior history of Echocardiogram examinations, most                 recent 09/27/2015. Mitral Valve Prolapse and Mitral                 Regurgitation; Risk Factors:Dyslipidemia and Hypertension.  Sonographer:    Koleen Popper RDCS Referring Phys: [phone] SUBRINA SUNDIL  Sonographer Comments: Global longitudinal strain was attempted. IMPRESSIONS  1. Left ventricular ejection fraction, by estimation, is 60 to 65%. The left ventricle has normal function. The left ventricle has no regional wall motion abnormalities. There is mild left ventricular hypertrophy. Left ventricular diastolic parameters are indeterminate. The average left ventricular global longitudinal strain is 15.8 %. The global longitudinal strain is abnormal.  2. Right ventricular systolic function is normal. The right ventricular size is normal. There is moderately elevated pulmonary artery systolic pressure. The estimated right ventricular systolic pressure is 58.4 mmHg.  3.  Left atrial size was moderately dilated.  4. Right atrial size was mildly dilated.  5. There is severe 4+ mitral regurgitation around A3/P3. There is calcification & thickening of the mitral valve leaflets with mild annular  calcification. There is mild prolapse. Consider TEE for further evaluation.  6. Tricuspid valve regurgitation is moderate to severe.  7. The aortic valve is normal in structure. There is mild calcification of the aortic valve. Aortic valve regurgitation is mild. No aortic stenosis is present.  8. The inferior vena cava is normal in size with greater than 50% respiratory variability, suggesting right atrial pressure of 3 mmHg. FINDINGS  Left Ventricle: Left ventricular ejection fraction, by estimation, is 60 to 65%. The left ventricle has normal function. The left ventricle has no regional wall motion abnormalities. The average left ventricular global longitudinal strain is 15.8 %. Strain was performed and the global longitudinal strain is abnormal. The left ventricular internal cavity size was normal in size. There is mild left ventricular hypertrophy. Left ventricular diastolic parameters are indeterminate. Right Ventricle: The right ventricular size is normal. No increase in right ventricular wall thickness. Right ventricular systolic function is normal. There is moderately elevated pulmonary artery systolic pressure. The tricuspid regurgitant velocity is 3.55 m/s, and with an assumed right atrial pressure of 8 mmHg, the estimated right ventricular systolic pressure is 58.4 mmHg. Left Atrium: Left atrial size was moderately dilated. Right Atrium: Right atrial size was mildly dilated. Pericardium: There is no evidence of pericardial effusion. Mitral Valve: The mitral valve is normal in structure. There is mild prolapse of of the mitral valve. There is mild thickening of the mitral valve leaflet(s). Mild mitral annular calcification. Severe mitral valve regurgitation. No evidence of mitral valve stenosis. Tricuspid Valve: The tricuspid valve is normal in structure. Tricuspid valve regurgitation is moderate to severe. No evidence of tricuspid stenosis. Aortic Valve: The aortic valve is normal in structure. There is  mild calcification of the aortic valve. There is mild aortic valve annular calcification. Aortic valve regurgitation is mild. No aortic stenosis is present. Pulmonic Valve: The pulmonic valve was normal in structure. Pulmonic valve regurgitation is not visualized. No evidence of pulmonic stenosis. Aorta: The aortic root is normal in size and structure. Venous: The inferior vena cava is normal in size with greater than 50% respiratory variability, suggesting right atrial pressure of 3 mmHg. IAS/Shunts: No atrial level shunt detected by color flow Doppler. Additional Comments: 3D was performed not requiring image post processing on an independent workstation and was indeterminate.  LEFT VENTRICLE PLAX 2D LVIDd:         4.60 cm   Diastology LVIDs:         2.90 cm   LV e' medial:    6.53 cm/s LV PW:         1.20 cm   LV E/e' medial:  18.5 LV IVS:        1.30 cm   LV e' lateral:   12.40 cm/s LVOT diam:     2.00 cm   LV E/e' lateral: 9.8 LV SV:         68 LV SV Index:   41        2D Longitudinal Strain LVOT Area:     3.14 cm  2D Strain GLS Avg:     15.8 %  RIGHT VENTRICLE             IVC RV Basal diam:  3.80 cm     IVC diam:  2.15 cm RV Mid diam:    2.60 cm RV S prime:     17.40 cm/s TAPSE (M-mode): 1.8 cm LEFT ATRIUM             Index        RIGHT ATRIUM           Index LA diam:        4.90 cm 3.00 cm/m   RA Area:     12.20 cm LA Vol (A2C):   79.5 ml 48.75 ml/m  RA Volume:   25.20 ml  15.45 ml/m LA Vol (A4C):   78.2 ml 47.96 ml/m LA Biplane Vol: 83.9 ml 51.45 ml/m  AORTIC VALVE LVOT Vmax:   92.80 cm/s LVOT Vmean:  66.000 cm/s LVOT VTI:    0.215 m  AORTA Ao Root diam: 3.10 cm Ao Asc diam:  3.20 cm MITRAL VALVE                TRICUSPID VALVE MV Area (PHT): 3.72 cm     TR Peak grad:   50.4 mmHg MV Decel Time: 204 msec     TR Vmax:        355.00 cm/s MV E velocity: 121.00 cm/s MV A velocity: 81.20 cm/s   SHUNTS MV E/A ratio:  1.49         Systemic VTI:  0.22 m                             Systemic Diam: 2.00 cm Aditya  Sabharwal Electronically signed by Ria Commander Signature Date/Time: 01/27/2024/3:29:23 PM    Final    Scheduled Meds:  apixaban   5 mg Oral BID   carbidopa -levodopa   1 tablet Oral QHS   carbidopa -levodopa   1 tablet Oral Q24H   carbidopa -levodopa   2 tablet Oral TID   diltiazem  10 mg Intravenous Once   doxycycline   100 mg Oral Q12H   flecainide   100 mg Oral BID   guaiFENesin   600 mg Oral BID   hydrALAZINE   25 mg Oral Q8H   metoprolol  tartrate  50 mg Oral BID   pravastatin   40 mg Oral Daily   sodium chloride  flush  3 mL Intravenous Q12H   sodium chloride  flush  3 mL Intravenous Q12H   traZODone   100 mg Oral QHS   Continuous Infusions:  cefTRIAXone  (ROCEPHIN )  IV 2 g (01/28/24 0934)   diltiazem (CARDIZEM) infusion       LOS: 3 days    Time spent: 50 mins    Darcel Dawley, MD [facility]   If 7PM-7AM, please contact night-coverage

## 2024-01-29 NOTE — Plan of Care (Signed)
  Problem: Education: Goal: Knowledge of General Education information will improve Description: Including pain rating scale, medication(s)/side effects and non-pharmacologic comfort measures Outcome: Progressing   Problem: Health Behavior/Discharge Planning: Goal: Ability to manage health-related needs will improve Outcome: Progressing   Problem: Clinical Measurements: Goal: Diagnostic test results will improve Outcome: Progressing Goal: Respiratory complications will improve Outcome: Progressing Goal: Cardiovascular complication will be avoided Outcome: Progressing   Problem: Nutrition: Goal: Adequate nutrition will be maintained Outcome: Progressing   Problem: Pain Managment: Goal: General experience of comfort will improve and/or be controlled Outcome: Progressing   Problem: Respiratory: Goal: Ability to maintain adequate ventilation will improve Outcome: Progressing Goal: Ability to maintain a clear airway will improve Outcome: Progressing

## 2024-01-29 NOTE — Progress Notes (Addendum)
   Was contacted by Blinda Ores RN for multiple pauses the patient was having. Per Dr Croitoru's note beta blocker was increased and IV Cardizem was to be started. The patient had  received one dose of metoprolol  tartrate 50mg  and IV cardizem had not been started when the patient started having the pause. The patient reported feeling asymptomatic but having a slight lightheadedness when these happened.  On reviewing telemetry there were at least 8 pauses. 6.10 sec and 5.66 sec at 1352, 5.71 sec at 1314, 6.79 sec at 1312, 4.6 seconds at 1247, 4.75 sec at 1243, 4.32 sec at 1239, and 3.92 sec at 1238.  After the pauses her heart rates appear to have decreased into the 80s from the 100s to 110s.  She still is having frequent ectopy on telemetry.  Message Dr. Francyne and he recommended stopping Cardizem and reducing metoprolol  to her home dose.  Order Mag, BMET, TSH.   Ordered EKG that showed atrial fibrillation with a rate of 84.  Decrease metoprolol  to 25mg  twice daily Stop IV Cardizem.  Signed,  Morse Clause, PA-C 01/29/2024, 3:22 PM

## 2024-01-29 NOTE — Progress Notes (Signed)
  Progress Note  Patient Name: Cassie White Date of Encounter: 01/29/2024 [facility] Cardiologist: Peter [name], MD   Interval Summary   Not feeling great, mildly dyspneic, no pain. Unaware of palpitations. In AFib with RVR since 2000h last night  Vital Signs Vitals:   01/29/24 0000 01/29/24 0411 01/29/24 0502 01/29/24 0738  BP: 118/69 120/72  (!) 162/149  Pulse: (!) 106 (!) 108  (!) 132  Resp: 20 (!) 25 (!) 23 (!) 24  Temp: 98.4 F (36.9 C) 97.9 F (36.6 C)  97.8 F (36.6 C)  TempSrc: Oral Oral  Oral  SpO2: 94% 95%  90%  Weight:      Height:        Intake/Output Summary (Last 24 hours) at 01/29/2024 0821 Last data filed at 01/29/2024 0413 Gross per 24 hour  Intake --  Output 1825 ml  Net -1825 ml      01/26/2024   11:00 PM 01/26/2024    5:03 PM 01/25/2024   11:31 AM  Last 3 Weights  Weight (lbs) 141 lb 8.6 oz 134 lb 0.6 oz 134 lb  Weight (kg) 64.2 kg 60.8 kg 60.782 kg      Telemetry/ECG  AFib w RVR - Personally Reviewed  Physical Exam  GEN: No acute distress.   Neck: No JVD Cardiac: irregular, 3/6 holosystolic apical murmur, no diastolic murmurs, rubs, or gallops.  Respiratory: Clear to auscultation bilaterally. GI: Soft, nontender, non-distended  MS: No edema  Assessment & Plan  83 y.o. female with a hx of paroxysmal atrial fibrillation, MVP with mitral regurgitation, hyperlipidemia, Parkinson's, chronic thrombocytopenia admitted with metapneumovirus pneumonia and found to have marked worsening of MR (severe) and moderate PAH. In NSR on admission, but developed AFib w RVR overnight. On chronic Eliquis , flecainide , metoprolol . Increase beta blocker and add IV diltiazem for AFib w RVR. Plan w/u for MR after she recovers from the acute respiratory illness, probably as an OP.   For questions or updates, please contact [facility] Please consult [email or web address] for contact info under       Signed, Jerel Balding, MD

## 2024-01-29 NOTE — Care Management Important Message (Signed)
 Important Message  Patient Details  Name: Cassie White MRN: [id number] Date of Birth: May 28, 1941   Important Message Given:  Yes - Medicare IM     Claretta Deed 01/29/2024, 2:16 PM

## 2024-01-30 DIAGNOSIS — J189 Pneumonia, unspecified organism: Secondary | ICD-10-CM | POA: Diagnosis not present

## 2024-01-30 MED ORDER — SALINE SPRAY 0.65 % NA SOLN
1.0000 | NASAL | Status: DC | PRN
  Filled 2024-01-30: qty 44

## 2024-01-30 NOTE — Progress Notes (Signed)
  Progress Note  Patient Name: Cassie White Date of Encounter: 01/30/2024 [facility] Cardiologist: Peter [name], MD   Interval Summary   Lying fully supine in bed, breathing comfortably. Back in sinus rhythm.  Did have some postconversion pauses yesterday.  Vital Signs Vitals:   01/29/24 2238 01/30/24 0500 01/30/24 0718 01/30/24 1046  BP: 102/63 (!) 111/51 (!) 130/51 (!) 103/58  Pulse: 74 65 66 (!) 58  Resp: 20 19 20 19   Temp: 97.8 F (36.6 C) 98.4 F (36.9 C) 97.8 F (36.6 C) 97.8 F (36.6 C)  TempSrc: Oral Oral Oral Oral  SpO2: 95% 93% 97% 96%  Weight:      Height:        Intake/Output Summary (Last 24 hours) at 01/30/2024 1054 Last data filed at 01/30/2024 0723 Gross per 24 hour  Intake 240 ml  Output 950 ml  Net -710 ml      01/26/2024   11:00 PM 01/26/2024    5:03 PM 01/25/2024   11:31 AM  Last 3 Weights  Weight (lbs) 141 lb 8.6 oz 134 lb 0.6 oz 134 lb  Weight (kg) 64.2 kg 60.8 kg 60.782 kg      Telemetry/ECG  Normal sinus rhythm- Personally Reviewed  Physical Exam  GEN: No acute distress.   Neck: No JVD Cardiac: RRR, apical holosystolic murmur, no systolic murmurs, rubs, or gallops.  Respiratory: Clear to auscultation bilaterally. GI: Soft, nontender, non-distended  MS: No edema  Assessment & Plan  83 y.o. female with a hx of paroxysmal atrial fibrillation, MVP with mitral regurgitation, hyperlipidemia, Parkinson's, chronic thrombocytopenia admitted with metapneumovirus pneumonia and found to have marked worsening of MR (severe) and moderate PAH.   Had paroxysmal atrial fibrillation with RVR yesterday, converted back to sinus rhythm with some postconversion pauses, but now in normal sinus rhythm, without bradycardia.  Will keep an eye for recurrent arrhythmia, but do not plan additional inpatient cardiac workup.  Will discuss at outpatient follow-up, whether or not she will benefit for a TEE for further evaluation of mitral valve pathology  leading up to possible mitral clip .  Do not recommend any change in cardiac medications. Will make arrangements for an earlier follow-up visit with Dr. [name] to discuss the mitral valve regurgitation.  For questions or updates, please contact [facility] Please consult [email or web address] for contact info under       Signed, Jerel Balding, MD

## 2024-01-30 NOTE — Plan of Care (Signed)

## 2024-01-30 NOTE — Progress Notes (Signed)
 Patient had a 4.95 second pause with no symptoms. Vitals stable. Will continue to monitor patient closely.

## 2024-01-30 NOTE — Progress Notes (Signed)
 PROGRESS NOTE    Cassie White  FMW:[id number] DOB: [birth year] DOA: 01/26/2024 PCP: Candise [name], MD   Brief Narrative:  This 83 yrs old female with PMH significant of paroxysmal atrial fibrillation, mitral regurgitation, hyperlipidemia, GERD, osteoporosis, essential hypertension, chronic thrombocytopenia, GERD, and Parkinson disease presented to the ED with confusion this morning, cough and shortness of breath for last few days.  Family also noticed right-sided weakness, Patient unable to feed herself and has missed doses of Sinemet  for last few days.  She was hypoxic with SpO2 of 88% on room air in the ED requiring 3.5 L of supplemental oxygen to maintain saturation above 93%.  CTA chest shows no evidence of PE but shows nodular airspace disease concerning for pneumonia. Family reports patient initially had left-sided upper extremity weakness which resolved in the ED.  EDP initially approached neurologist Dr. Jerrie who states symptoms are not related to TIA,  there is no need to pursue TIA workup.  Patient was admitted for acute hypoxic respiratory failure secondary to community-acquired pneumonia and started on empiric antibiotics.  Assessment & Plan:   Principal Problem:   CAP (community acquired pneumonia) Active Problems:   Acute respiratory failure with hypoxia (HCC)   Acute metabolic encephalopathy   Hyperlipidemia   QT prolongation   Essential hypertension   Paroxysmal atrial fibrillation (HCC)   Elevated troponin   Chronic idiopathic thrombocytopenia (HCC)   GERD (gastroesophageal reflux disease)   History of Parkinson disease   Elevated brain natriuretic peptide (BNP) level   Acute hypoxic respiratory failure: Sepsis secondary to pneumonia: Concern for aspiration pneumonia: Patient presented in the ED with productive cough, shortness of breath, confusion, generalized weakness and increasing tremor of both upper extremities. She was hypotensive, tachycardic, tachypneic  and hypoxic in the ED. Chest x-ray unremarkable.  CTA chest showed bilateral lung pneumonia. Patient meets sepsis criteria  (hypotension, tachycardia and having a source of infection). CT head > No acute intracranial abnormality. Initiated on empiric antibiotics IV ceftriaxone  and azithromycin . RVP shows metapneumovirus+, follow-up urine Legionella, sputum culture.   Blood cultures > No growth in 5 days Continue supplemental oxygen and wean as tolerated. Continue ceftriaxone  and doxycycline . Discontinue droplet precautions.  Elevated BNP: Elevated troponin-secondary to demand ischemia: Elevated BNP around 438.  No previous history of CHF.   Troponin 25 > 25 flat.  EKG showing atrial fibrillation rate controlled.  2D echo shows LVEF 60 to 65%, no regional wall motion abnormalities.  Prolonged Qtc: Underlying prolonged QTc in the setting of chronic Sinemet  use.   At this time unable to discontinue the Sinemet  abruptly.   Need to seen by neurology clinic outpatient for dose adjustment. Continue to monitor electrolytes while patient here in the hospital and continue cardiac monitoring.   Acute metabolic encephalopathy / Confusion - resolved. Family reports that patient was confused and unable to feed herself with right hand due to increased tremor which is causing weakness, generalized weakness and fatigue.  Patient has underlying Parkinson disease and has tremor and bradykinesia at the baseline. Concern for acute metabolic encephalopathy specifically confusion in the setting of acute hypoxic respiratory failure due to pneumonia. CT head > No acute intracranial abnormality. At this time there is no concern for TIA given patient has generalized weakness, increased tremor in the setting of acute hypoxic respiratory failure and missing dose of Sinemet .  Physical exam did not appreciate any evidence of upper and lower extremity weakness and per family patient is at baseline. Dr. Tomma saw  discussed  with neurology Dr. Jerrie  >No concern for TIA at this time and there is no need to pursue TIA workup as well. Continue to treat for pneumonia and hypoxic respiratory failure as mentioned above.   Essential hypertension: Held Lopressor  in the setting of hypotension. Blood pressure now improved, metoprolol  resumed.   Paroxysmal atrial fibrillation: Continue flecainide  and Eliquis .   Metoprolol  now resumed.   Heart rate continued to remain elevated.  Cardiology started Cardizem infusion. She converted to normal sinus rhythm.  And postconversion she has some pauses. Cardizem drip discontinued, metoprolol  changed to the prior dosing.   Chronic idiopathic thrombocytopenia: Stable platelet count,  continue to monitor.   History of Parkinson's disease: Continue Sinemet .     DVT prophylaxis: Eliquis  Code Status: Full code Family Communication: No family at bed side Disposition Plan:    Status is: Inpatient Remains inpatient appropriate because: Admitted for Acute hypoxic respiratory failure, secondary to community acquired PNA.  Now went into A-fib with RVR. Now converted to NSR   Consultants:  Neurology Cardiology  Procedures: CTA Chest  Antimicrobials:  Anti-infectives (From admission, onward)    Start     Dose/Rate Route Frequency Ordered Stop   01/28/24 0708  doxycycline  (VIBRA -TABS) tablet 100 mg        100 mg Oral Every 12 hours 01/28/24 0708 02/03/24 0959   01/27/24 2200  doxycycline  (VIBRAMYCIN ) 100 mg in sodium chloride  0.9 % 250 mL IVPB  Status:  Discontinued        100 mg 125 mL/hr over 120 Minutes Intravenous Every 12 hours 01/26/24 2202 01/28/24 0708   01/27/24 0800  cefTRIAXone  (ROCEPHIN ) 2 g in sodium chloride  0.9 % 100 mL IVPB        2 g 200 mL/hr over 30 Minutes Intravenous Every 24 hours 01/26/24 2202 02/02/24 0959   01/27/24 0000  Ampicillin-Sulbactam (UNASYN) 3 g in sodium chloride  0.9 % 100 mL IVPB  Status:  Discontinued        3 g 200 mL/hr  over 30 Minutes Intravenous Every 6 hours 01/26/24 2034 01/26/24 2200   01/26/24 2000  cefTRIAXone  (ROCEPHIN ) 1 g in sodium chloride  0.9 % 100 mL IVPB        1 g 200 mL/hr over 30 Minutes Intravenous  Once 01/26/24 1948 01/26/24 2145   01/26/24 2000  azithromycin  (ZITHROMAX ) 500 mg in sodium chloride  0.9 % 250 mL IVPB        500 mg 250 mL/hr over 60 Minutes Intravenous  Once 01/26/24 1948 01/26/24 2311      Subjective: Patient was seen and examined at bedside.Overnight events noted. Patient reports feeling well today.  She was lying comfortably flat on the bed.  She converted to normal sinus rhythm. Patient is back to her baseline mental status.    Objective: Vitals:   01/29/24 2238 01/30/24 0500 01/30/24 0718 01/30/24 1046  BP: 102/63 (!) 111/51 (!) 130/51 (!) 103/58  Pulse: 74 65 66 (!) 58  Resp: 20 19 20 19   Temp: 97.8 F (36.6 C) 98.4 F (36.9 C) 97.8 F (36.6 C) 97.8 F (36.6 C)  TempSrc: Oral Oral Oral Oral  SpO2: 95% 93% 97% 96%  Weight:      Height:        Intake/Output Summary (Last 24 hours) at 01/30/2024 1112 Last data filed at 01/30/2024 0723 Gross per 24 hour  Intake 240 ml  Output 950 ml  Net -710 ml   Filed Weights   01/26/24 1703 01/26/24 2300  Weight:  60.8 kg 64.2 kg    Examination:  General exam: Appears calm and comfortable, deconditioned, not in any acute distress. Respiratory system: CTA Bilaterally. Respiratory effort normal.  RR 13 Cardiovascular system: S1 & S2 heard, RRR. No JVD, murmurs, rubs, gallops or clicks.  Gastrointestinal system: Abdomen is non distended, soft and non tender. Normal bowel sounds heard. Central nervous system: Alert and oriented X 3. No focal neurological deficits. Extremities: No Edema, no cyanosis, no clubbing. Skin: No rashes, lesions or ulcers Psychiatry: Judgement and insight appear normal. Mood & affect appropriate.     Data Reviewed: I have personally reviewed following labs and imaging  studies  CBC: Recent Labs  Lab 01/26/24 1645 01/27/24 0241  WBC 6.0 4.3  NEUTROABS 3.7  --   HGB 12.5 12.4  HCT 38.7 37.4  MCV 96.5 94.2  PLT 101* 107*   Basic Metabolic Panel: Recent Labs  Lab 01/26/24 1645 01/27/24 0241 01/29/24 1546  NA 137 136 136  K 3.8 4.2 4.1  CL 101 103 100  CO2 21* 23 27  GLUCOSE 129* 195* 100*  BUN 12 11 11   CREATININE 0.76 0.79 0.67  CALCIUM  8.6* 8.7* 8.9  MG  --   --  2.2   GFR: Estimated Creatinine Clearance: 46.6 mL/min (by C-G formula based on SCr of 0.67 mg/dL). Liver Function Tests: No results for input(s): AST, ALT, ALKPHOS, BILITOT, PROT, ALBUMIN in the last 168 hours. No results for input(s): LIPASE, AMYLASE in the last 168 hours. No results for input(s): AMMONIA in the last 168 hours. Coagulation Profile: No results for input(s): INR, PROTIME in the last 168 hours. Cardiac Enzymes: No results for input(s): CKTOTAL, CKMB, CKMBINDEX, TROPONINI in the last 168 hours. BNP (last 3 results) No results for input(s): PROBNP in the last 8760 hours. HbA1C: No results for input(s): HGBA1C in the last 72 hours. CBG: Recent Labs  Lab 01/26/24 1816  GLUCAP 151*   Lipid Profile: No results for input(s): CHOL, HDL, LDLCALC, TRIG, CHOLHDL, LDLDIRECT in the last 72 hours. Thyroid  Function Tests: Recent Labs    01/29/24 1546  TSH 2.776   Anemia Panel: No results for input(s): VITAMINB12, FOLATE, FERRITIN, TIBC, IRON, RETICCTPCT in the last 72 hours. Sepsis Labs: Recent Labs  Lab 01/26/24 1818  PROCALCITON <0.10    Recent Results (from the past 240 hours)  Resp panel by RT-PCR (RSV, Flu A&B, Covid) Anterior Nasal Swab     Status: None   Collection Time: 01/25/24 11:34 AM   Specimen: Anterior Nasal Swab  Result Value Ref Range Status   SARS Coronavirus 2 by RT PCR NEGATIVE NEGATIVE Final    Comment: (NOTE) SARS-CoV-2 target nucleic acids are NOT DETECTED.  The  SARS-CoV-2 RNA is generally detectable in upper respiratory specimens during the acute phase of infection. The lowest concentration of SARS-CoV-2 viral copies this assay can detect is 138 copies/mL. A negative result does not preclude SARS-Cov-2 infection and should not be used as the sole basis for treatment or other patient management decisions. A negative result may occur with  improper specimen collection/handling, submission of specimen other than nasopharyngeal swab, presence of viral mutation(s) within the areas targeted by this assay, and inadequate number of viral copies(<138 copies/mL). A negative result must be combined with clinical observations, patient history, and epidemiological information. The expected result is Negative.  Fact Sheet for Patients:  BloggerCourse.com  Fact Sheet for Healthcare Providers:  SeriousBroker.it  This test is no t yet approved or cleared by the [place]  States FDA and  has been authorized for detection and/or diagnosis of SARS-CoV-2 by FDA under an Emergency Use Authorization (EUA). This EUA will remain  in effect (meaning this test can be used) for the duration of the COVID-19 declaration under Section 564(b)(1) of the Act, 21 U.S.C.section 360bbb-3(b)(1), unless the authorization is terminated  or revoked sooner.       Influenza A by PCR NEGATIVE NEGATIVE Final   Influenza B by PCR NEGATIVE NEGATIVE Final    Comment: (NOTE) The Xpert Xpress SARS-CoV-2/FLU/RSV plus assay is intended as an aid in the diagnosis of influenza from Nasopharyngeal swab specimens and should not be used as a sole basis for treatment. Nasal washings and aspirates are unacceptable for Xpert Xpress SARS-CoV-2/FLU/RSV testing.  Fact Sheet for Patients: BloggerCourse.com  Fact Sheet for Healthcare Providers: SeriousBroker.it  This test is not yet approved or  cleared by the [place]  FDA and has been authorized for detection and/or diagnosis of SARS-CoV-2 by FDA under an Emergency Use Authorization (EUA). This EUA will remain in effect (meaning this test can be used) for the duration of the COVID-19 declaration under Section 564(b)(1) of the Act, 21 U.S.C. section 360bbb-3(b)(1), unless the authorization is terminated or revoked.     Resp Syncytial Virus by PCR NEGATIVE NEGATIVE Final    Comment: (NOTE) Fact Sheet for Patients: BloggerCourse.com  Fact Sheet for Healthcare Providers: SeriousBroker.it  This test is not yet approved or cleared by the [place]  FDA and has been authorized for detection and/or diagnosis of SARS-CoV-2 by FDA under an Emergency Use Authorization (EUA). This EUA will remain in effect (meaning this test can be used) for the duration of the COVID-19 declaration under Section 564(b)(1) of the Act, 21 U.S.C. section 360bbb-3(b)(1), unless the authorization is terminated or revoked.  Performed at [facility], [address] [place], [place] [address]   Expectorated Sputum Assessment w Gram Stain, Rflx to Resp Cult     Status: None   Collection Time: 01/26/24  8:18 PM   Specimen: Sputum  Result Value Ref Range Status   Specimen Description SPUTUM  Final   Special Requests NONE  Final   Sputum evaluation   Final    Sputum specimen not acceptable for testing.  Please recollect.   Gram Stain Report Called to,Read Back By and Verified With: RN C. FRANKLIN [id number] @ [phone] FH Performed at [facility], [address] [place], [place] [address]    Report Status 01/27/2024 FINAL  Final  Respiratory (~20 pathogens) panel by PCR     Status: Abnormal   Collection Time: 01/26/24  8:45 PM   Specimen: Nasopharyngeal Swab; Respiratory  Result Value Ref Range Status   Adenovirus NOT DETECTED NOT DETECTED Final   Coronavirus 229E NOT DETECTED NOT  DETECTED Final    Comment: (NOTE) The Coronavirus on the Respiratory Panel, DOES NOT test for the novel  Coronavirus (2019 nCoV)    Coronavirus HKU1 NOT DETECTED NOT DETECTED Final   Coronavirus NL63 NOT DETECTED NOT DETECTED Final   Coronavirus OC43 NOT DETECTED NOT DETECTED Final   Metapneumovirus DETECTED (A) NOT DETECTED Final   Rhinovirus / Enterovirus NOT DETECTED NOT DETECTED Final   Influenza A NOT DETECTED NOT DETECTED Final   Influenza B NOT DETECTED NOT DETECTED Final   Parainfluenza Virus 1 NOT DETECTED NOT DETECTED Final   Parainfluenza Virus 2 NOT DETECTED NOT DETECTED Final   Parainfluenza Virus 3 NOT DETECTED NOT DETECTED Final  Parainfluenza Virus 4 NOT DETECTED NOT DETECTED Final   Respiratory Syncytial Virus NOT DETECTED NOT DETECTED Final   Bordetella pertussis NOT DETECTED NOT DETECTED Final   Bordetella Parapertussis NOT DETECTED NOT DETECTED Final   Chlamydophila pneumoniae NOT DETECTED NOT DETECTED Final   Mycoplasma pneumoniae NOT DETECTED NOT DETECTED Final    Comment: Performed at [facility], [address], [place], [place] [address]  Culture, blood (routine x 2) Call MD if unable to obtain prior to antibiotics being given     Status: None (Preliminary result)   Collection Time: 01/26/24  8:55 PM   Specimen: BLOOD  Result Value Ref Range Status   Specimen Description BLOOD RIGHT ANTECUBITAL  Final   Special Requests   Final    BOTTLES DRAWN AEROBIC AND ANAEROBIC Blood Culture adequate volume   Culture   Final    NO GROWTH 4 DAYS Performed at [facility], [address], [place], [place] [address]    Report Status PENDING  Incomplete  Culture, blood (routine x 2) Call MD if unable to obtain prior to antibiotics being given     Status: None (Preliminary result)   Collection Time: 01/26/24  9:01 PM   Specimen: BLOOD RIGHT WRIST  Result Value Ref Range Status   Specimen Description BLOOD RIGHT WRIST  Final   Special Requests   Final     BOTTLES DRAWN AEROBIC AND ANAEROBIC Blood Culture adequate volume   Culture   Final    NO GROWTH 4 DAYS Performed at [facility], [address], [place], [place] [address]    Report Status PENDING  Incomplete  MRSA Next Gen by PCR, Nasal     Status: None   Collection Time: 01/26/24 11:05 PM   Specimen: Nasal Mucosa; Nasal Swab  Result Value Ref Range Status   MRSA by PCR Next Gen NOT DETECTED NOT DETECTED Final    Comment: (NOTE) The GeneXpert MRSA Assay (FDA approved for NASAL specimens only), is one component of a comprehensive MRSA colonization surveillance program. It is not intended to diagnose MRSA infection nor to guide or monitor treatment for MRSA infections. Test performance is not FDA approved in patients less than 83 years old. Performed at [facility], [address] [place], [place] [address]     Radiology Studies: No results found.  Scheduled Meds:  apixaban   5 mg Oral BID   carbidopa -levodopa   1 tablet Oral QHS   carbidopa -levodopa   1 tablet Oral Q24H   carbidopa -levodopa   2 tablet Oral TID   doxycycline   100 mg Oral Q12H   flecainide   100 mg Oral BID   guaiFENesin   600 mg Oral BID   hydrALAZINE   25 mg Oral Q8H   metoprolol  tartrate  25 mg Oral BID   pravastatin   40 mg Oral Daily   sodium chloride  flush  3 mL Intravenous Q12H   sodium chloride  flush  3 mL Intravenous Q12H   traZODone   100 mg Oral QHS   Continuous Infusions:  cefTRIAXone  (ROCEPHIN )  IV 2 g (01/30/24 0933)     LOS: 4 days    Time spent: 35 mins    Darcel Dawley, MD [facility]   If 7PM-7AM, please contact night-coverage

## 2024-01-31 DIAGNOSIS — J189 Pneumonia, unspecified organism: Secondary | ICD-10-CM | POA: Diagnosis not present

## 2024-01-31 LAB — CULTURE, BLOOD (ROUTINE X 2)
Culture: NO GROWTH
Culture: NO GROWTH
Special Requests: ADEQUATE
Special Requests: ADEQUATE

## 2024-01-31 NOTE — Progress Notes (Signed)
 No arrhythmia recurrence. No new recommendations. August appointment scheduled with Dr. [name] to discuss further evaluation and treatment of MR. Cassie Balding, MD, [facility] [phone] office [phone] pager

## 2024-01-31 NOTE — Progress Notes (Signed)
 PROGRESS NOTE    Cassie White  FMW:[id number] DOB: 1941/07/22 DOA: 01/26/2024 PCP: Candise [name], MD   Brief Narrative:  This 83 yrs old female with PMH significant of paroxysmal atrial fibrillation, mitral regurgitation, hyperlipidemia, GERD, osteoporosis, essential hypertension, chronic thrombocytopenia, GERD, and Parkinson disease presented to the ED with confusion this morning, cough and shortness of breath for last few days.  Family also noticed right-sided weakness, Patient unable to feed herself and has missed doses of Sinemet  for last few days.  She was hypoxic with SpO2 of 88% on room air in the ED requiring 3.5 L of supplemental oxygen to maintain saturation above 93%.  CTA chest shows no evidence of PE but shows nodular airspace disease concerning for pneumonia. Family reports patient initially had left-sided upper extremity weakness which resolved in the ED.  EDP initially approached neurologist Dr. Jerrie who states symptoms are not related to TIA,  there is no need to pursue TIA workup.  Patient was admitted for acute hypoxic respiratory failure secondary to community-acquired pneumonia and started on empiric antibiotics.  Assessment & Plan:   Principal Problem:   CAP (community acquired pneumonia) Active Problems:   Acute respiratory failure with hypoxia (HCC)   Acute metabolic encephalopathy   Hyperlipidemia   QT prolongation   Essential hypertension   Paroxysmal atrial fibrillation (HCC)   Elevated troponin   Chronic idiopathic thrombocytopenia (HCC)   GERD (gastroesophageal reflux disease)   History of Parkinson disease   Elevated brain natriuretic peptide (BNP) level   Acute hypoxic respiratory failure: Sepsis secondary to pneumonia: Concern for aspiration pneumonia: Patient presented in the ED with productive cough, shortness of breath, confusion, generalized weakness and increasing tremor of both upper extremities. She was hypotensive, tachycardic, tachypneic  and hypoxic in the ED. Chest x-ray unremarkable.  CTA chest showed bilateral lung pneumonia. Patient meets sepsis criteria  (hypotension, tachycardia and having a source of infection). CT head > No acute intracranial abnormality. Initiated on empiric antibiotics IV ceftriaxone  and azithromycin . RVP shows metapneumovirus+, strep pneumo and Legionella urinary antigen negative. Blood cultures > No growth in 5 days Continue supplemental oxygen and wean as tolerated. Completed ceftriaxone  and doxycycline  for 5 days. Discontinue droplet precautions.  Elevated BNP: Elevated troponin-secondary to demand ischemia: Elevated BNP around 438.  No previous history of CHF.   Troponin 25 > 25 flat.  EKG showing atrial fibrillation, rate controlled.  2D echo shows LVEF 60 to 65%, no regional wall motion abnormalities.  Prolonged Qtc: Underlying prolonged QTc in the setting of chronic Sinemet  use.   At this time unable to discontinue the Sinemet  abruptly.   Need to seen by neurology clinic outpatient for dose adjustment. Continue to monitor electrolytes while patient here in the hospital and continue cardiac monitoring.   Acute metabolic encephalopathy / Confusion - resolved. Family reports that patient was confused and unable to feed herself with right hand due to increased tremor which is causing weakness, generalized weakness and fatigue.  Patient has underlying Parkinson disease and has tremor and bradykinesia at the baseline. Concern for acute metabolic encephalopathy specifically confusion in the setting of acute hypoxic respiratory failure due to pneumonia. CT head > No acute intracranial abnormality. At this time there is no concern for TIA given patient has generalized weakness, increased tremor in the setting of acute hypoxic respiratory failure and missing dose of Sinemet .  Physical exam did not appreciate any evidence of upper and lower extremity weakness and per family patient is at  baseline. Dr.  Lawaing saw discussed with neurology Dr. Jerrie  >No concern for TIA at this time and there is no need to pursue TIA workup as well. Continue to treat for pneumonia and hypoxic respiratory failure as mentioned above.   Essential hypertension: Held Lopressor  in the setting of hypotension. Blood pressure now improved, metoprolol  resumed.   Paroxysmal atrial fibrillation: Continue flecainide  and Eliquis .   Metoprolol  now resumed.   Heart rate continued to remain elevated.  Cardiology started Cardizem infusion. She converted to normal sinus rhythm.  Postconversion she has some pauses. Cardizem drip discontinued, metoprolol  changed to the prior dosing. Cardiology signed off,  outpatient follow-up as scheduled.   Chronic idiopathic thrombocytopenia: Stable platelet count,  continue to monitor.   History of Parkinson's disease: Continue Sinemet .     DVT prophylaxis: Eliquis  Code Status: Full code Family Communication: No family at bed side Disposition Plan:    Status is: Inpatient Remains inpatient appropriate because: Admitted for Acute hypoxic respiratory failure, secondary to community acquired PNA.   Now went into A-fib with RVR. Now converted to NSR.  Patient now medically clear, awaiting SNF insurance authorization.   Consultants:  Neurology Cardiology  Procedures: CTA Chest  Antimicrobials:  Anti-infectives (From admission, onward)    Start     Dose/Rate Route Frequency Ordered Stop   01/28/24 0708  doxycycline  (VIBRA -TABS) tablet 100 mg  Status:  Discontinued        100 mg Oral Every 12 hours 01/28/24 0708 01/31/24 1123   01/27/24 2200  doxycycline  (VIBRAMYCIN ) 100 mg in sodium chloride  0.9 % 250 mL IVPB  Status:  Discontinued        100 mg 125 mL/hr over 120 Minutes Intravenous Every 12 hours 01/26/24 2202 01/28/24 0708   01/27/24 0800  cefTRIAXone  (ROCEPHIN ) 2 g in sodium chloride  0.9 % 100 mL IVPB  Status:  Discontinued        2 g 200 mL/hr over 30  Minutes Intravenous Every 24 hours 01/26/24 2202 01/31/24 1123   01/27/24 0000  Ampicillin-Sulbactam (UNASYN) 3 g in sodium chloride  0.9 % 100 mL IVPB  Status:  Discontinued        3 g 200 mL/hr over 30 Minutes Intravenous Every 6 hours 01/26/24 2034 01/26/24 2200   01/26/24 2000  cefTRIAXone  (ROCEPHIN ) 1 g in sodium chloride  0.9 % 100 mL IVPB        1 g 200 mL/hr over 30 Minutes Intravenous  Once 01/26/24 1948 01/26/24 2145   01/26/24 2000  azithromycin  (ZITHROMAX ) 500 mg in sodium chloride  0.9 % 250 mL IVPB        500 mg 250 mL/hr over 60 Minutes Intravenous  Once 01/26/24 1948 01/26/24 2311      Subjective: Patient was seen and examined at bedside. Overnight events noted. Patient reports feeling well today. She was lying comfortably flat on the bed.   She is converted to normal sinus rhythm. Patient is back to her baseline mental status.    Objective: Vitals:   01/31/24 0800 01/31/24 0822 01/31/24 0900 01/31/24 1000  BP:  (!) 125/47    Pulse: 66 70 65 64  Resp: (!) 28 (!) 23 16 (!) 28  Temp:  98.3 F (36.8 C)    TempSrc:  Oral    SpO2: 93% 93% 99% 97%  Weight:      Height:        Intake/Output Summary (Last 24 hours) at 01/31/2024 1128 Last data filed at 01/31/2024 0529 Gross per 24 hour  Intake 420  ml  Output 800 ml  Net -380 ml   Filed Weights   01/26/24 1703 01/26/24 2300  Weight: 60.8 kg 64.2 kg    Examination:  General exam: Appears calm and comfortable, deconditioned, not in any acute distress. Respiratory system: CTA Bilaterally. Respiratory effort normal.  RR 14 Cardiovascular system: S1 & S2 heard, RRR. No JVD, murmurs, rubs, gallops or clicks.  Gastrointestinal system: Abdomen is non distended, soft and non tender. Normal bowel sounds heard. Central nervous system: Alert and oriented X 3. No focal neurological deficits. Extremities: No Edema, no cyanosis, no clubbing. Skin: No rashes, lesions or ulcers Psychiatry: Judgement and insight appear normal.  Mood & affect appropriate.     Data Reviewed: I have personally reviewed following labs and imaging studies  CBC: Recent Labs  Lab 01/26/24 1645 01/27/24 0241  WBC 6.0 4.3  NEUTROABS 3.7  --   HGB 12.5 12.4  HCT 38.7 37.4  MCV 96.5 94.2  PLT 101* 107*   Basic Metabolic Panel: Recent Labs  Lab 01/26/24 1645 01/27/24 0241 01/29/24 1546  NA 137 136 136  K 3.8 4.2 4.1  CL 101 103 100  CO2 21* 23 27  GLUCOSE 129* 195* 100*  BUN 12 11 11   CREATININE 0.76 0.79 0.67  CALCIUM  8.6* 8.7* 8.9  MG  --   --  2.2   GFR: Estimated Creatinine Clearance: 46.6 mL/min (by C-G formula based on SCr of 0.67 mg/dL). Liver Function Tests: No results for input(s): AST, ALT, ALKPHOS, BILITOT, PROT, ALBUMIN in the last 168 hours. No results for input(s): LIPASE, AMYLASE in the last 168 hours. No results for input(s): AMMONIA in the last 168 hours. Coagulation Profile: No results for input(s): INR, PROTIME in the last 168 hours. Cardiac Enzymes: No results for input(s): CKTOTAL, CKMB, CKMBINDEX, TROPONINI in the last 168 hours. BNP (last 3 results) No results for input(s): PROBNP in the last 8760 hours. HbA1C: No results for input(s): HGBA1C in the last 72 hours. CBG: Recent Labs  Lab 01/26/24 1816  GLUCAP 151*   Lipid Profile: No results for input(s): CHOL, HDL, LDLCALC, TRIG, CHOLHDL, LDLDIRECT in the last 72 hours. Thyroid  Function Tests: Recent Labs    01/29/24 1546  TSH 2.776   Anemia Panel: No results for input(s): VITAMINB12, FOLATE, FERRITIN, TIBC, IRON, RETICCTPCT in the last 72 hours. Sepsis Labs: Recent Labs  Lab 01/26/24 1818  PROCALCITON <0.10    Recent Results (from the past 240 hours)  Resp panel by RT-PCR (RSV, Flu A&B, Covid) Anterior Nasal Swab     Status: None   Collection Time: 01/25/24 11:34 AM   Specimen: Anterior Nasal Swab  Result Value Ref Range Status   SARS Coronavirus 2 by RT PCR  NEGATIVE NEGATIVE Final    Comment: (NOTE) SARS-CoV-2 target nucleic acids are NOT DETECTED.  The SARS-CoV-2 RNA is generally detectable in upper respiratory specimens during the acute phase of infection. The lowest concentration of SARS-CoV-2 viral copies this assay can detect is 138 copies/mL. A negative result does not preclude SARS-Cov-2 infection and should not be used as the sole basis for treatment or other patient management decisions. A negative result may occur with  improper specimen collection/handling, submission of specimen other than nasopharyngeal swab, presence of viral mutation(s) within the areas targeted by this assay, and inadequate number of viral copies(<138 copies/mL). A negative result must be combined with clinical observations, patient history, and epidemiological information. The expected result is Negative.  Fact Sheet for Patients:  BloggerCourse.com  Fact Sheet for Healthcare Providers:  SeriousBroker.it  This test is no t yet approved or cleared by the [place]  FDA and  has been authorized for detection and/or diagnosis of SARS-CoV-2 by FDA under an Emergency Use Authorization (EUA). This EUA will remain  in effect (meaning this test can be used) for the duration of the COVID-19 declaration under Section 564(b)(1) of the Act, 21 U.S.C.section 360bbb-3(b)(1), unless the authorization is terminated  or revoked sooner.       Influenza A by PCR NEGATIVE NEGATIVE Final   Influenza B by PCR NEGATIVE NEGATIVE Final    Comment: (NOTE) The Xpert Xpress SARS-CoV-2/FLU/RSV plus assay is intended as an aid in the diagnosis of influenza from Nasopharyngeal swab specimens and should not be used as a sole basis for treatment. Nasal washings and aspirates are unacceptable for Xpert Xpress SARS-CoV-2/FLU/RSV testing.  Fact Sheet for Patients: BloggerCourse.com  Fact Sheet for  Healthcare Providers: SeriousBroker.it  This test is not yet approved or cleared by the [place]  FDA and has been authorized for detection and/or diagnosis of SARS-CoV-2 by FDA under an Emergency Use Authorization (EUA). This EUA will remain in effect (meaning this test can be used) for the duration of the COVID-19 declaration under Section 564(b)(1) of the Act, 21 U.S.C. section 360bbb-3(b)(1), unless the authorization is terminated or revoked.     Resp Syncytial Virus by PCR NEGATIVE NEGATIVE Final    Comment: (NOTE) Fact Sheet for Patients: BloggerCourse.com  Fact Sheet for Healthcare Providers: SeriousBroker.it  This test is not yet approved or cleared by the [place]  FDA and has been authorized for detection and/or diagnosis of SARS-CoV-2 by FDA under an Emergency Use Authorization (EUA). This EUA will remain in effect (meaning this test can be used) for the duration of the COVID-19 declaration under Section 564(b)(1) of the Act, 21 U.S.C. section 360bbb-3(b)(1), unless the authorization is terminated or revoked.  Performed at [facility], [address] [place], [place] [address]   Expectorated Sputum Assessment w Gram Stain, Rflx to Resp Cult     Status: None   Collection Time: 01/26/24  8:18 PM   Specimen: Sputum  Result Value Ref Range Status   Specimen Description SPUTUM  Final   Special Requests NONE  Final   Sputum evaluation   Final    Sputum specimen not acceptable for testing.  Please recollect.   Gram Stain Report Called to,Read Back By and Verified With: RN C. FRANKLIN [id number] @ [phone] FH Performed at [facility], [address], [place], [place] [address]    Report Status 01/27/2024 FINAL  Final  Respiratory (~20 pathogens) panel by PCR     Status: Abnormal   Collection Time: 01/26/24  8:45 PM   Specimen: Nasopharyngeal Swab; Respiratory  Result Value  Ref Range Status   Adenovirus NOT DETECTED NOT DETECTED Final   Coronavirus 229E NOT DETECTED NOT DETECTED Final    Comment: (NOTE) The Coronavirus on the Respiratory Panel, DOES NOT test for the novel  Coronavirus (2019 nCoV)    Coronavirus HKU1 NOT DETECTED NOT DETECTED Final   Coronavirus NL63 NOT DETECTED NOT DETECTED Final   Coronavirus OC43 NOT DETECTED NOT DETECTED Final   Metapneumovirus DETECTED (A) NOT DETECTED Final   Rhinovirus / Enterovirus NOT DETECTED NOT DETECTED Final   Influenza A NOT DETECTED NOT DETECTED Final   Influenza B NOT DETECTED NOT DETECTED Final   Parainfluenza Virus 1 NOT DETECTED NOT DETECTED Final  Parainfluenza Virus 2 NOT DETECTED NOT DETECTED Final   Parainfluenza Virus 3 NOT DETECTED NOT DETECTED Final   Parainfluenza Virus 4 NOT DETECTED NOT DETECTED Final   Respiratory Syncytial Virus NOT DETECTED NOT DETECTED Final   Bordetella pertussis NOT DETECTED NOT DETECTED Final   Bordetella Parapertussis NOT DETECTED NOT DETECTED Final   Chlamydophila pneumoniae NOT DETECTED NOT DETECTED Final   Mycoplasma pneumoniae NOT DETECTED NOT DETECTED Final    Comment: Performed at [facility], [address] [place], [place] [address]  Culture, blood (routine x 2) Call MD if unable to obtain prior to antibiotics being given     Status: None   Collection Time: 01/26/24  8:55 PM   Specimen: BLOOD  Result Value Ref Range Status   Specimen Description BLOOD RIGHT ANTECUBITAL  Final   Special Requests   Final    BOTTLES DRAWN AEROBIC AND ANAEROBIC Blood Culture adequate volume   Culture   Final    NO GROWTH 5 DAYS Performed at [facility], [address] [place], [place] [address]    Report Status 01/31/2024 FINAL  Final  Culture, blood (routine x 2) Call MD if unable to obtain prior to antibiotics being given     Status: None   Collection Time: 01/26/24  9:01 PM   Specimen: BLOOD RIGHT WRIST  Result Value Ref Range Status   Specimen  Description BLOOD RIGHT WRIST  Final   Special Requests   Final    BOTTLES DRAWN AEROBIC AND ANAEROBIC Blood Culture adequate volume   Culture   Final    NO GROWTH 5 DAYS Performed at [facility], [address] [place], [place] [address]    Report Status 01/31/2024 FINAL  Final  MRSA Next Gen by PCR, Nasal     Status: None   Collection Time: 01/26/24 11:05 PM   Specimen: Nasal Mucosa; Nasal Swab  Result Value Ref Range Status   MRSA by PCR Next Gen NOT DETECTED NOT DETECTED Final    Comment: (NOTE) The GeneXpert MRSA Assay (FDA approved for NASAL specimens only), is one component of a comprehensive MRSA colonization surveillance program. It is not intended to diagnose MRSA infection nor to guide or monitor treatment for MRSA infections. Test performance is not FDA approved in patients less than 81 years old. Performed at [facility], [address] [place], [place] [address]     Radiology Studies: No results found.  Scheduled Meds:  apixaban   5 mg Oral BID   carbidopa -levodopa   1 tablet Oral QHS   carbidopa -levodopa   1 tablet Oral Q24H   carbidopa -levodopa   2 tablet Oral TID   flecainide   100 mg Oral BID   guaiFENesin   600 mg Oral BID   hydrALAZINE   25 mg Oral Q8H   metoprolol  tartrate  25 mg Oral BID   pravastatin   40 mg Oral Daily   sodium chloride  flush  3 mL Intravenous Q12H   sodium chloride  flush  3 mL Intravenous Q12H   traZODone   100 mg Oral QHS   Continuous Infusions:     LOS: 5 days    Time spent: 35 mins    Darcel Dawley, MD [facility]   If 7PM-7AM, please contact night-coverage

## 2024-02-01 DIAGNOSIS — N3000 Acute cystitis without hematuria: Secondary | ICD-10-CM

## 2024-02-01 DIAGNOSIS — D693 Immune thrombocytopenic purpura: Secondary | ICD-10-CM | POA: Diagnosis not present

## 2024-02-01 DIAGNOSIS — J9601 Acute respiratory failure with hypoxia: Secondary | ICD-10-CM | POA: Diagnosis not present

## 2024-02-01 DIAGNOSIS — J189 Pneumonia, unspecified organism: Secondary | ICD-10-CM | POA: Diagnosis not present

## 2024-02-01 MED ORDER — CARMEX CLASSIC LIP BALM EX OINT
1.0000 | TOPICAL_OINTMENT | CUTANEOUS | Status: DC | PRN
  Filled 2024-02-01: qty 10

## 2024-02-01 NOTE — Progress Notes (Signed)
 Physical Therapy Treatment Patient Details Name: Cassie White MRN: [id number] DOB: Sep 11, 1940 Today's Date: 02/01/2024   History of Present Illness Pt is 83 yo presenting to [facility] ED due to confusion, cough and shortness of breathe with R sided weakness which seems to have resolved. Pt admitted for acute hypoxic respiratory failure secondary to CAP. PMH: paroxysmal a-fib, mitral regurgitation, hyperlipidemia, GERD, osteoporosis, HTN, chronic thrombocytopenia, GERD and parkinson's disease.    PT Comments  Eager to work with therapy. Min assist for bed mobility, mod assist to stand from edge of bed with heavy posterior lean, and min assist for RW control to ambulate short distance in room. Very slow, shuffled, trunk flexed, with initial posterior lean but improved with training. No overt buckling with distance. SpO2 96% on 3L while ambulating. Patient will benefit from continued inpatient follow up therapy, <3 hours/day     If plan is discharge home, recommend the following: Assist for transportation;Help with stairs or ramp for entrance;A lot of help with walking and/or transfers;A lot of help with bathing/dressing/bathroom   Can travel by private vehicle     Yes  Equipment Recommendations  Other (comment) (TBD next venue)    Recommendations for Other Services       Precautions / Restrictions Precautions Precautions: Fall Recall of Precautions/Restrictions: Intact Restrictions Weight Bearing Restrictions Per Provider Order: No     Mobility  Bed Mobility Overal bed mobility: Needs Assistance Bed Mobility: Supine to Sit     Supine to sit: Min assist     General bed mobility comments: Min assist to scoot to EOB. Cues for technique    Transfers Overall transfer level: Needs assistance Equipment used: Rolling walker (2 wheels), None Transfers: Sit to/from Stand, Bed to chair/wheelchair/BSC Sit to Stand: Mod assist           General transfer comment: Mod assist for boost  to stand, performed x2 from edge of bed with significant retropulsion. Fair control with descent into recliner. Cues for hand placement.    Ambulation/Gait Ambulation/Gait assistance: Min assist Gait Distance (Feet): 15 Feet Assistive device: Rolling walker (2 wheels) Gait Pattern/deviations: Step-through pattern, Decreased stride length, Shuffle, Drifts right/left Gait velocity: dec     General Gait Details: Intermittent cues for upright posture, min assist for RW control and posterior instability initially but progressed throughout session. Very slow, shuffled steps. Trunk flexed significantlly Max cues for larger step length and foot clearance. SpO2 up to 96% on 3L while walking.   Stairs             Wheelchair Mobility     Tilt Bed    Modified Rankin (Stroke Patients Only)       Balance Overall balance assessment: Needs assistance Sitting-balance support: No upper extremity supported, Feet supported Sitting balance-Leahy Scale: Fair Sitting balance - Comments: CGA   Standing balance support: Bilateral upper extremity supported, During functional activity, Reliant on assistive device for balance Standing balance-Leahy Scale: Poor                              Communication Communication Communication: Impaired Factors Affecting Communication: Hearing impaired  Cognition Arousal: Alert Behavior During Therapy: WFL for tasks assessed/performed   PT - Cognitive impairments: No apparent impairments                         Following commands: Intact      Cueing Cueing Techniques: Verbal  cues  Exercises      General Comments General comments (skin integrity, edema, etc.): VSS on 3L      Pertinent Vitals/Pain Pain Assessment Pain Assessment: No/denies pain    Home Living                          Prior Function            PT Goals (current goals can now be found in the care plan section) Acute Rehab PT Goals Patient  Stated Goal: To improve mobility PT Goal Formulation: With patient/family Time For Goal Achievement: 02/11/24 Potential to Achieve Goals: Good Progress towards PT goals: Progressing toward goals    Frequency    Min 1X/week      PT Plan      Co-evaluation              AM-PAC PT 6 Clicks Mobility   Outcome Measure  Help needed turning from your back to your side while in a flat bed without using bedrails?: A Little Help needed moving from lying on your back to sitting on the side of a flat bed without using bedrails?: A Little Help needed moving to and from a bed to a chair (including a wheelchair)?: A Lot Help needed standing up from a chair using your arms (e.g., wheelchair or bedside chair)?: A Lot Help needed to walk in hospital room?: A Lot Help needed climbing 3-5 steps with a railing? : Total 6 Click Score: 13    End of Session Equipment Utilized During Treatment: Gait belt Activity Tolerance: Patient tolerated treatment well Patient left: in chair;with call bell/phone within reach Nurse Communication: Mobility status PT Visit Diagnosis: Other abnormalities of gait and mobility (R26.89);Muscle weakness (generalized) (M62.81);History of falling (Z91.81)     Time: 8550-8484 PT Time Calculation (min) (ACUTE ONLY): 26 min  Charges:    $Gait Training: 8-22 mins $Therapeutic Activity: 8-22 mins PT General Charges $$ ACUTE PT VISIT: 1 Visit                     Cassie White, PT, DPT [facility]  Rehabilitation Services Physical Therapist Office: [phone] Website: [email or web address]    Cassie White 02/01/2024, 4:32 PM

## 2024-02-01 NOTE — TOC Progression Note (Signed)
 Transition of Care [facility]) - Progression Note    Patient Details  Name: Cassie White MRN: [id number] Date of Birth: Mar 11, 1941  [facility] of [facility]) CM/SW Contact  Amauri Keefe A [name], LCSW Phone Number: 02/01/2024, 2:01 PM  Clinical Narrative:     CSW notified by provided of possible DC for tomorrow.   CSW to start insurance authorization and provide updated PT note when available.   Auth status pending, [id number].  CSW notified country side of possible DC Tuesday.   TOC will continue to follow.   Expected Discharge Plan: Skilled Nursing Facility Barriers to Discharge: Continued Medical Work up  Expected Discharge Plan and Services                                               Social Determinants of Health (SDOH) Interventions SDOH Screenings   Food Insecurity: No Food Insecurity (01/27/2024)  Housing: Patient Declined (01/27/2024)  Transportation Needs: Patient Declined (01/27/2024)  Utilities: Patient Declined (01/27/2024)  Alcohol Screen: Low Risk  (07/22/2023)  Depression (PHQ2-9): Low Risk  (09/18/2023)  Financial Resource Strain: Low Risk  (07/22/2023)  Physical Activity: Inactive (07/22/2023)  Social Connections: Moderately Isolated (07/22/2023)  Stress: No Stress Concern Present (07/22/2023)  Tobacco Use: Low Risk  (01/26/2024)  Health Literacy: Adequate Health Literacy (07/22/2023)    Readmission Risk Interventions     No data to display

## 2024-02-01 NOTE — Progress Notes (Signed)
 [facility]                                                                              Cassie White, is a 83 y.o. female, DOB - May 08, 1941, FMW:[id number] Admit date - 01/26/2024    Outpatient Primary MD for the patient is [name], [name], MD  LOS - 6  days  Chief Complaint  Patient presents with   Shortness of Breath   Altered Mental Status   Respiratory Distress       Brief summary   Patient is a 83 yrs old female with paroxysmal atrial fibrillation, mitral regurgitation,  GERD, osteoporosis, HTN, HLP, chronic thrombocytopenia, GERD, and Parkinson disease presented to the ED with confusion on the morning of admission, cough, shortness of breath for the last few days.  Family also noticed right-sided weakness, Patient unable to feed herself and has missed doses of Sinemet  for last few days.  She was hypoxic with SpO2 of 88% on room air in the ED requiring 3.5 L of supplemental oxygen to maintain saturation above 93%.  CTA chest shows no evidence of PE but shows nodular airspace disease concerning for pneumonia. Family reports patient initially had left-sided upper extremity weakness which resolved in the ED.  EDP initially approached neurologist Dr. Jerrie who states symptoms are not related to TIA,  there is no need to pursue TIA workup.  Patient was admitted for acute hypoxic respiratory failure secondary to community-acquired pneumonia and started on empiric antibiotics.   Assessment & Plan      Acute hypoxic respiratory failure: Sepsis secondary to pneumonia: Concern for aspiration pneumonia: - Presented to ED with productive cough, shortness of breath, confusion, generalized weakness and increasing tremor of both upper extremities. She was hypotensive, tachycardic, tachypneic and hypoxic in the ED. -Chest x-ray unremarkable.  CTA chest showed bilateral lung pneumonia. -Patient meets sepsis criteria  (hypotension, tachycardia and having a source of  infection). -CT head > No acute intracranial abnormality. -Initiated on empiric antibiotics IV ceftriaxone  and azithromycin , transition to p.o. antibiotics, completed ceftriaxone  and doxycycline  for 5 days. -RVP shows metapneumovirus+, strep pneumo and Legionella urinary antigen negative. -Blood cultures > No growth in 5 days -Wean O2 as tolerated, 4.5 L this morning,, O2 sats 96%, titrated down to 4 L wean down as tolerated, home O2 evaluation    Elevated BNP: Elevated troponin-secondary to demand ischemia: Elevated BNP around 438.  No previous history of CHF.   Troponin 25 > 25 flat.  EKG showing atrial fibrillation, rate controlled.  2D echo shows LVEF 60 to 65%, no regional wall motion abnormalities.   Prolonged Qtc: -Underlying prolonged QTc in the setting of chronic Sinemet  use.   At this time unable to discontinue the Sinemet  abruptly.  She needs to be seen by neurology outpatient for dose adjustment if needed.   Acute metabolic encephalopathy / Confusion - resolved. -Family reports that patient was confused and unable to feed herself with right hand due to increased tremor which is causing weakness, generalized weakness and fatigue.  Patient has underlying Parkinson disease and has tremor and bradykinesia at the baseline. -Concern for acute metabolic encephalopathy specifically  confusion in the setting of acute hypoxic respiratory failure due to pneumonia. -CT head > No acute intracranial abnormality. -Patient has generalized weakness, increased tremor in the setting of missed Sinemet  doses and acute respiratory failure with hypoxia.  EDP discussed with neurology, Dr. Jerrie,  no concern for TIA and no need to pursue stroke workup.    Essential hypertension: BP stable, Lopressor  resumed   Paroxysmal atrial fibrillation with RVR: Continue flecainide  and Eliquis .   -Cardiology consulted, had paroxysmal A-fib with RVR on 6/27, converted back to NSR.  Postconversion had some pauses.   Cardizem infusion was discontinued. -Per cardiology, no arrhythmia recurrence, no new recommendations, outpatient follow-up appointment with Dr. [name].     Chronic idiopathic thrombocytopenia: Stable platelet count,  continue to monitor.   History of Parkinson's disease: Continue Sinemet .   Estimated body mass index is 26.74 kg/m as calculated from the following:   Height as of this encounter: 5' 1 (1.549 m).   Weight as of this encounter: 64.2 kg.  Code Status: Full code DVT Prophylaxis:  SCDs Start: 01/26/24 2018 Place TED hose Start: 01/26/24 2018 apixaban  (ELIQUIS ) tablet 5 mg   Level of Care: Level of care: Telemetry Cardiac Family Communication: Updated patient Disposition Plan:      Remains inpatient appropriate: Try to wean O2 down, home O2 evaluation, hopefully DC to SNF in a.m. if insurance authorization received   Procedures:    Consultants:   Cardiology  Antimicrobials:   Anti-infectives (From admission, onward)    Start     Dose/Rate Route Frequency Ordered Stop   01/28/24 0708  doxycycline  (VIBRA -TABS) tablet 100 mg  Status:  Discontinued        100 mg Oral Every 12 hours 01/28/24 0708 01/31/24 1123   01/27/24 2200  doxycycline  (VIBRAMYCIN ) 100 mg in sodium chloride  0.9 % 250 mL IVPB  Status:  Discontinued        100 mg 125 mL/hr over 120 Minutes Intravenous Every 12 hours 01/26/24 2202 01/28/24 0708   01/27/24 0800  cefTRIAXone  (ROCEPHIN ) 2 g in sodium chloride  0.9 % 100 mL IVPB  Status:  Discontinued        2 g 200 mL/hr over 30 Minutes Intravenous Every 24 hours 01/26/24 2202 01/31/24 1123   01/27/24 0000  Ampicillin-Sulbactam (UNASYN) 3 g in sodium chloride  0.9 % 100 mL IVPB  Status:  Discontinued        3 g 200 mL/hr over 30 Minutes Intravenous Every 6 hours 01/26/24 2034 01/26/24 2200   01/26/24 2000  cefTRIAXone  (ROCEPHIN ) 1 g in sodium chloride  0.9 % 100 mL IVPB        1 g 200 mL/hr over 30 Minutes Intravenous  Once 01/26/24 1948 01/26/24 2145    01/26/24 2000  azithromycin  (ZITHROMAX ) 500 mg in sodium chloride  0.9 % 250 mL IVPB        500 mg 250 mL/hr over 60 Minutes Intravenous  Once 01/26/24 1948 01/26/24 2311          Medications  apixaban   5 mg Oral BID   carbidopa -levodopa   1 tablet Oral QHS   carbidopa -levodopa   1 tablet Oral Q24H   carbidopa -levodopa   2 tablet Oral TID   flecainide   100 mg Oral BID   guaiFENesin   600 mg Oral BID   hydrALAZINE   25 mg Oral Q8H   metoprolol  tartrate  25 mg Oral BID   pravastatin   40 mg Oral Daily   sodium chloride  flush  3 mL Intravenous Q12H  sodium chloride  flush  3 mL Intravenous Q12H   traZODone   100 mg Oral QHS      Subjective:   Ceana Menn was seen and examined today.  No acute complaints, no acute chest pain, shortness of breath, dizziness, lightheadedness.  On O2 via [place], 4.5 L.  No acute events overnight.    Objective:   Vitals:   02/01/24 0402 02/01/24 0723 02/01/24 0740 02/01/24 1129  BP: (!) 122/51 125/64 (!) 131/56 (!) 146/56  Pulse: 63  63   Resp: 20  (!) 21   Temp: 98.1 F (36.7 C)  97.6 F (36.4 C) 98.2 F (36.8 C)  TempSrc: Oral  Oral Oral  SpO2: 94%  93%   Weight:      Height:        Intake/Output Summary (Last 24 hours) at 02/01/2024 1212 Last data filed at 02/01/2024 0900 Gross per 24 hour  Intake 253 ml  Output 600 ml  Net -347 ml     Wt Readings from Last 3 Encounters:  01/26/24 64.2 kg  01/25/24 60.8 kg  12/16/23 62.6 kg     Exam General: Alert and oriented x 3, NAD Cardiovascular: S1 S2 auscultated,  RRR, apical holosystolic murmur, Respiratory: Clear to auscultation bilaterally, no wheezing Gastrointestinal: Soft, nontender, nondistended, + bowel sounds Ext: no pedal edema bilaterally Neuro: Strength 5/5 upper and lower extremities bilaterally Psych: Normal affect     Data Reviewed:  I have personally reviewed following labs    CBC Lab Results  Component Value Date   WBC 4.3 01/27/2024   RBC 3.97 01/27/2024    HGB 12.4 01/27/2024   HCT 37.4 01/27/2024   MCV 94.2 01/27/2024   MCH 31.2 01/27/2024   PLT 107 (L) 01/27/2024   MCHC 33.2 01/27/2024   RDW 13.2 01/27/2024   LYMPHSABS 1.6 01/26/2024   MONOABS 0.7 01/26/2024   EOSABS 0.0 01/26/2024   BASOSABS 0.0 01/26/2024     Last metabolic panel Lab Results  Component Value Date   NA 136 01/29/2024   K 4.1 01/29/2024   CL 100 01/29/2024   CO2 27 01/29/2024   BUN 11 01/29/2024   CREATININE 0.67 01/29/2024   GLUCOSE 100 (H) 01/29/2024   GFRNONAA >60 01/29/2024   GFRAA >60 03/12/2019   CALCIUM  8.9 01/29/2024   PROT 6.3 09/18/2023   ALBUMIN 4.0 09/18/2023   BILITOT 0.6 09/18/2023   ALKPHOS 67 09/18/2023   AST 17 09/18/2023   ALT 5 09/18/2023   ANIONGAP 9 01/29/2024    CBG (last 3)  No results for input(s): GLUCAP in the last 72 hours.    Coagulation Profile: No results for input(s): INR, PROTIME in the last 168 hours.   Radiology Studies: I have personally reviewed the imaging studies  No results found.     Nydia Distance M.D. [facility] 02/01/2024, 12:12 PM  Available via Epic secure chat 7am-7pm After 7 pm, please refer to night coverage provider listed on amion.

## 2024-02-01 NOTE — Plan of Care (Signed)
  Problem: Education: Goal: Knowledge of General Education information will improve Description: Including pain rating scale, medication(s)/side effects and non-pharmacologic comfort measures Outcome: Progressing   Problem: Health Behavior/Discharge Planning: Goal: Ability to manage health-related needs will improve Outcome: Progressing   Problem: Clinical Measurements: Goal: Ability to maintain clinical measurements within normal limits will improve Outcome: Progressing Goal: Will remain free from infection Outcome: Progressing Goal: Diagnostic test results will improve Outcome: Progressing Goal: Respiratory complications will improve Outcome: Progressing Goal: Cardiovascular complication will be avoided Outcome: Progressing   Problem: Activity: Goal: Risk for activity intolerance will decrease Outcome: Progressing   Problem: Nutrition: Goal: Adequate nutrition will be maintained Outcome: Progressing   Problem: Coping: Goal: Level of anxiety will decrease Outcome: Progressing   Problem: Elimination: Goal: Will not experience complications related to bowel motility Outcome: Progressing Goal: Will not experience complications related to urinary retention Outcome: Progressing   Problem: Pain Managment: Goal: General experience of comfort will improve and/or be controlled Outcome: Progressing   Problem: Safety: Goal: Ability to remain free from injury will improve Outcome: Progressing   Problem: Activity: Goal: Ability to tolerate increased activity will improve Outcome: Progressing   Problem: Skin Integrity: Goal: Risk for impaired skin integrity will decrease Outcome: Progressing   Problem: Clinical Measurements: Goal: Ability to maintain a body temperature in the normal range will improve Outcome: Progressing   Problem: Respiratory: Goal: Ability to maintain adequate ventilation will improve Outcome: Progressing Goal: Ability to maintain a clear airway  will improve Outcome: Progressing

## 2024-02-02 DIAGNOSIS — G250 Essential tremor: Secondary | ICD-10-CM | POA: Diagnosis not present

## 2024-02-02 DIAGNOSIS — I4581 Long QT syndrome: Secondary | ICD-10-CM | POA: Diagnosis not present

## 2024-02-02 DIAGNOSIS — G629 Polyneuropathy, unspecified: Secondary | ICD-10-CM | POA: Diagnosis not present

## 2024-02-02 DIAGNOSIS — M545 Low back pain, unspecified: Secondary | ICD-10-CM | POA: Diagnosis not present

## 2024-02-02 DIAGNOSIS — G20A1 Parkinson's disease without dyskinesia, without mention of fluctuations: Secondary | ICD-10-CM | POA: Diagnosis not present

## 2024-02-02 DIAGNOSIS — Z7901 Long term (current) use of anticoagulants: Secondary | ICD-10-CM | POA: Diagnosis not present

## 2024-02-02 DIAGNOSIS — M6281 Muscle weakness (generalized): Secondary | ICD-10-CM | POA: Diagnosis not present

## 2024-02-02 DIAGNOSIS — G8929 Other chronic pain: Secondary | ICD-10-CM | POA: Diagnosis not present

## 2024-02-02 DIAGNOSIS — G9341 Metabolic encephalopathy: Secondary | ICD-10-CM | POA: Diagnosis not present

## 2024-02-02 DIAGNOSIS — I480 Paroxysmal atrial fibrillation: Secondary | ICD-10-CM | POA: Diagnosis not present

## 2024-02-02 DIAGNOSIS — R7989 Other specified abnormal findings of blood chemistry: Secondary | ICD-10-CM | POA: Diagnosis not present

## 2024-02-02 DIAGNOSIS — R2681 Unsteadiness on feet: Secondary | ICD-10-CM | POA: Diagnosis not present

## 2024-02-02 DIAGNOSIS — E785 Hyperlipidemia, unspecified: Secondary | ICD-10-CM | POA: Diagnosis not present

## 2024-02-02 DIAGNOSIS — M810 Age-related osteoporosis without current pathological fracture: Secondary | ICD-10-CM | POA: Diagnosis not present

## 2024-02-02 DIAGNOSIS — J9601 Acute respiratory failure with hypoxia: Secondary | ICD-10-CM | POA: Diagnosis not present

## 2024-02-02 DIAGNOSIS — K219 Gastro-esophageal reflux disease without esophagitis: Secondary | ICD-10-CM | POA: Diagnosis not present

## 2024-02-02 DIAGNOSIS — K5730 Diverticulosis of large intestine without perforation or abscess without bleeding: Secondary | ICD-10-CM | POA: Diagnosis not present

## 2024-02-02 DIAGNOSIS — I10 Essential (primary) hypertension: Secondary | ICD-10-CM | POA: Diagnosis not present

## 2024-02-02 DIAGNOSIS — D693 Immune thrombocytopenic purpura: Secondary | ICD-10-CM | POA: Diagnosis not present

## 2024-02-02 MED ORDER — CARBIDOPA-LEVODOPA 25-100 MG PO TABS
2.0000 | ORAL_TABLET | ORAL | Status: DC
  Administered 2024-02-02: 2 via ORAL
  Filled 2024-02-02 (×2): qty 2

## 2024-02-02 MED ORDER — ORAL CARE MOUTH RINSE
15.0000 mL | OROMUCOSAL | Status: DC | PRN
Start: 2024-02-02 — End: 2024-02-02

## 2024-02-02 MED ORDER — GUAIFENESIN ER 600 MG PO TB12
600.0000 mg | ORAL_TABLET | Freq: Two times a day (BID) | ORAL | Status: AC
Start: 2024-02-02 — End: ?

## 2024-02-02 NOTE — Plan of Care (Signed)

## 2024-02-02 NOTE — Plan of Care (Signed)
  Problem: Education: Goal: Knowledge of General Education information will improve Description: Including pain rating scale, medication(s)/side effects and non-pharmacologic comfort measures Outcome: Progressing   Problem: Clinical Measurements: Goal: Respiratory complications will improve Outcome: Progressing Goal: Cardiovascular complication will be avoided Outcome: Progressing   Problem: Pain Managment: Goal: General experience of comfort will improve and/or be controlled Outcome: Progressing

## 2024-02-02 NOTE — Discharge Summary (Signed)
 Physician Discharge Summary  Cassie White FMW:[id number] DOB: [birth year] DOA: 01/26/2024  PCP: Candise [name], MD  Admit date: 01/26/2024 Discharge date: 02/02/2024  Admitted From: Home Disposition: Skilled nursing facility  Recommendations for Outpatient Follow-up:  Follow up with PCP in 1-2 weeks Please obtain BMP/CBC in one week   Home Health: N/A Equipment/Devices: N/A  Discharge Condition: Stable CODE STATUS: Full code Diet recommendation: Low-salt diet, nutritional supplements  Discharge summary: 83 year old with paroxysmal A-fib, mitral regurgitation, GERD, hypertension, hyperlipidemia, chronic thrombocytopenia and Parkinson's disease presented with confusion, cough and shortness of breath for the last few days.  In the emergency room patient was 88% on room air, CTA showed nodular airspace disease concerning for pneumonia.  Initially reported left-sided upper extremity weakness however resolved.  Admitted due to pneumonia and hypoxemia.  Patient was found to have metapneumovirus infection.  Ultimately improving.  Going to skilled nursing facility for rehab today.  Acute hypoxemic respiratory failure, sepsis secondary to pneumonia due to metapneumovirus: Presented with hypotension, lactic acidosis and confusion.  Treated symptomatically with bronchodilator therapy, IV fluids and antibiotics.  Blood cultures negative.  Strep and Legionella negative.  RVP positive for metapneumovirus. Completed course of antibiotics. Continue incentive spirometry, cough medications and mobility at rehab. Currently on room air.  Acute metabolic encephalopathy with confusion: Improved.  CT head negative.  No focal deficits since arrival to the hospital.  Essential hypertension blood pressure stable on Lopressor .  Paroxysmal A-fib with RVR: Currently sinus rhythm on flecainide  and Eliquis .  Parkinson's disease: On Sinemet .  Deconditioning: Will benefit with rehab.  Transferring to a skilled  nursing facility for inpatient therapies today.  Stable for transfer.   Discharge Diagnoses:  Principal Problem:   CAP (community acquired pneumonia) Active Problems:   Acute respiratory failure with hypoxia (HCC)   Acute metabolic encephalopathy   Hyperlipidemia   QT prolongation   Essential hypertension   Paroxysmal atrial fibrillation (HCC)   Elevated troponin   Chronic idiopathic thrombocytopenia (HCC)   GERD (gastroesophageal reflux disease)   History of Parkinson disease   Elevated brain natriuretic peptide (BNP) level    Discharge Instructions  Discharge Instructions     Diet - low sodium heart healthy   Complete by: As directed    Increase activity slowly   Complete by: As directed       Allergies as of 02/02/2024       Reactions   Sulfa Antibiotics Swelling   face   Tizanidine  Other (See Comments)   delirium        Medication List     STOP taking these medications    benzonatate  100 MG capsule Commonly known as: TESSALON    betamethasone dipropionate 0.05 % lotion   fluticasone  50 MCG/ACT nasal spray Commonly known as: FLONASE        TAKE these medications    Biotin  5000 MCG Caps Take 5,000 mcg by mouth daily.   CALCIUM  600 PO Take 600 mg by mouth 2 (two) times daily.   carbidopa -levodopa  25-100 MG tablet Commonly known as: SINEMET  IR Take  2 at 8am, Take 2 at 11 am,  Take 2 at 2 pm and 1 at 5pm   carbidopa -levodopa  50-200 MG tablet Commonly known as: SINEMET  CR TAKE 1 TABLET BY MOUTH EVERYDAY AT BEDTIME   cyanocobalamin  1000 MCG/ML injection Commonly known as: VITAMIN B12 INJECT 1 ML (1,000 MCG) INTRAMUSCULARLY EVERY 30 DAYS   Eliquis  5 MG Tabs tablet Generic drug: apixaban  TAKE 1 TABLET BY MOUTH TWICE A DAY  Fish Oil  1200 MG Caps Take 1,000 mg by mouth 2 (two) times daily.   flecainide  100 MG tablet Commonly known as: TAMBOCOR  Take 1 tablet (100 mg total) by mouth 2 (two) times daily.   gabapentin  300 MG  capsule Commonly known as: NEURONTIN  TAKE 2 CAPSULES BY MOUTH TWICE A DAY   guaiFENesin  600 MG 12 hr tablet Commonly known as: MUCINEX  Take 1 tablet (600 mg total) by mouth 2 (two) times daily.   metoprolol  tartrate 25 MG tablet Commonly known as: LOPRESSOR  Take 1 tablet (25 mg total) by mouth 2 (two) times daily.   nitroGLYCERIN  0.4 MG SL tablet Commonly known as: NITROSTAT  PLACE 1 TABLET UNDER THE TONGUE EVERY 5 MINUTES AS NEEDED FOR CHEST PAIN.   pravastatin  40 MG tablet Commonly known as: PRAVACHOL  TAKE 1 TABLET BY MOUTH EVERY DAY   raloxifene  60 MG tablet Commonly known as: EVISTA  TAKE 1 TABLET BY MOUTH EVERY DAY   traZODone  100 MG tablet Commonly known as: DESYREL  TAKE 1 TABLET BY MOUTH EVERYDAY AT BEDTIME        Contact information for follow-up providers     [name], Peter M, MD Follow up on 04/01/2024.   Specialty: Cardiology Why: Cardiology Follow-up with Dr. [name] on 04/01/2024 at 11:40 AM. Contact information: [address] [place] [address] [phone]              Contact information for after-discharge care     Destination     [facility] .   Service: Skilled Nursing Contact information: [address] [place]  [address] [phone]                    Allergies  Allergen Reactions   Sulfa Antibiotics Swelling    face   Tizanidine  Other (See Comments)    delirium    Consultations: Cardiology   Procedures/Studies: ECHOCARDIOGRAM COMPLETE Result Date: 01/27/2024    ECHOCARDIOGRAM REPORT   Patient Name:   Cassie White Date of Exam: 01/27/2024 Medical Rec #:  [id number]       Height:       61.0 in Accession #:    [id number]      Weight:       141.5 lb Date of Birth:  12-15-1940        BSA:          1.631 m Patient Age:    82 years        BP:           134/77 mmHg Patient Gender: F               HR:           72 bpm. Exam Location:  Inpatient Procedure: 2D  Echo, Strain Analysis, Cardiac Doppler and Color Doppler (Both            Spectral and Color Flow Doppler were utilized during procedure). Indications:    Elevated troponin  History:        Patient has prior history of Echocardiogram examinations, most                 recent 09/27/2015. Mitral Valve Prolapse and Mitral                 Regurgitation; Risk Factors:Dyslipidemia and Hypertension.  Sonographer:    Koleen Popper RDCS Referring Phys: [phone] SUBRINA SUNDIL  Sonographer Comments: Global longitudinal strain was attempted. IMPRESSIONS  1. Left ventricular  ejection fraction, by estimation, is 60 to 65%. The left ventricle has normal function. The left ventricle has no regional wall motion abnormalities. There is mild left ventricular hypertrophy. Left ventricular diastolic parameters are indeterminate. The average left ventricular global longitudinal strain is 15.8 %. The global longitudinal strain is abnormal.  2. Right ventricular systolic function is normal. The right ventricular size is normal. There is moderately elevated pulmonary artery systolic pressure. The estimated right ventricular systolic pressure is 58.4 mmHg.  3. Left atrial size was moderately dilated.  4. Right atrial size was mildly dilated.  5. There is severe 4+ mitral regurgitation around A3/P3. There is calcification & thickening of the mitral valve leaflets with mild annular calcification. There is mild prolapse. Consider TEE for further evaluation.  6. Tricuspid valve regurgitation is moderate to severe.  7. The aortic valve is normal in structure. There is mild calcification of the aortic valve. Aortic valve regurgitation is mild. No aortic stenosis is present.  8. The inferior vena cava is normal in size with greater than 50% respiratory variability, suggesting right atrial pressure of 3 mmHg. FINDINGS  Left Ventricle: Left ventricular ejection fraction, by estimation, is 60 to 65%. The left ventricle has normal function. The left  ventricle has no regional wall motion abnormalities. The average left ventricular global longitudinal strain is 15.8 %. Strain was performed and the global longitudinal strain is abnormal. The left ventricular internal cavity size was normal in size. There is mild left ventricular hypertrophy. Left ventricular diastolic parameters are indeterminate. Right Ventricle: The right ventricular size is normal. No increase in right ventricular wall thickness. Right ventricular systolic function is normal. There is moderately elevated pulmonary artery systolic pressure. The tricuspid regurgitant velocity is 3.55 m/s, and with an assumed right atrial pressure of 8 mmHg, the estimated right ventricular systolic pressure is 58.4 mmHg. Left Atrium: Left atrial size was moderately dilated. Right Atrium: Right atrial size was mildly dilated. Pericardium: There is no evidence of pericardial effusion. Mitral Valve: The mitral valve is normal in structure. There is mild prolapse of of the mitral valve. There is mild thickening of the mitral valve leaflet(s). Mild mitral annular calcification. Severe mitral valve regurgitation. No evidence of mitral valve stenosis. Tricuspid Valve: The tricuspid valve is normal in structure. Tricuspid valve regurgitation is moderate to severe. No evidence of tricuspid stenosis. Aortic Valve: The aortic valve is normal in structure. There is mild calcification of the aortic valve. There is mild aortic valve annular calcification. Aortic valve regurgitation is mild. No aortic stenosis is present. Pulmonic Valve: The pulmonic valve was normal in structure. Pulmonic valve regurgitation is not visualized. No evidence of pulmonic stenosis. Aorta: The aortic root is normal in size and structure. Venous: The inferior vena cava is normal in size with greater than 50% respiratory variability, suggesting right atrial pressure of 3 mmHg. IAS/Shunts: No atrial level shunt detected by color flow Doppler. Additional  Comments: 3D was performed not requiring image post processing on an independent workstation and was indeterminate.  LEFT VENTRICLE PLAX 2D LVIDd:         4.60 cm   Diastology LVIDs:         2.90 cm   LV e' medial:    6.53 cm/s LV PW:         1.20 cm   LV E/e' medial:  18.5 LV IVS:        1.30 cm   LV e' lateral:   12.40 cm/s LVOT diam:  2.00 cm   LV E/e' lateral: 9.8 LV SV:         68 LV SV Index:   41        2D Longitudinal Strain LVOT Area:     3.14 cm  2D Strain GLS Avg:     15.8 %  RIGHT VENTRICLE             IVC RV Basal diam:  3.80 cm     IVC diam: 2.15 cm RV Mid diam:    2.60 cm RV S prime:     17.40 cm/s TAPSE (M-mode): 1.8 cm LEFT ATRIUM             Index        RIGHT ATRIUM           Index LA diam:        4.90 cm 3.00 cm/m   RA Area:     12.20 cm LA Vol (A2C):   79.5 ml 48.75 ml/m  RA Volume:   25.20 ml  15.45 ml/m LA Vol (A4C):   78.2 ml 47.96 ml/m LA Biplane Vol: 83.9 ml 51.45 ml/m  AORTIC VALVE LVOT Vmax:   92.80 cm/s LVOT Vmean:  66.000 cm/s LVOT VTI:    0.215 m  AORTA Ao Root diam: 3.10 cm Ao Asc diam:  3.20 cm MITRAL VALVE                TRICUSPID VALVE MV Area (PHT): 3.72 cm     TR Peak grad:   50.4 mmHg MV Decel Time: 204 msec     TR Vmax:        355.00 cm/s MV E velocity: 121.00 cm/s MV A velocity: 81.20 cm/s   SHUNTS MV E/A ratio:  1.49         Systemic VTI:  0.22 m                             Systemic Diam: 2.00 cm Aditya Sabharwal Electronically signed by Ria Commander Signature Date/Time: 01/27/2024/3:29:23 PM    Final    CT HEAD WO CONTRAST Result Date: 01/26/2024 CLINICAL DATA:  Mental status change EXAM: CT HEAD WITHOUT CONTRAST TECHNIQUE: Contiguous axial images were obtained from the base of the skull through the vertex without intravenous contrast. RADIATION DOSE REDUCTION: This exam was performed according to the departmental dose-optimization program which includes automated exposure control, adjustment of the mA and/or kV according to patient size and/or use of  iterative reconstruction technique. COMPARISON:  CT head 03/17/2023. FINDINGS: Brain: No acute intracranial hemorrhage. No CT evidence of acute infarct. Nonspecific hypoattenuation in the periventricular and subcortical white matter favored to reflect chronic microvascular ischemic changes. No edema, mass effect, or midline shift. The basilar cisterns are patent. Ventricles: Prominence of the ventricles suggesting underlying parenchymal volume loss. Vascular: Atherosclerotic calcifications of the carotid siphons and intracranial vertebral arteries. No hyperdense vessel. Skull: No acute or aggressive finding. Orbits: Bilateral lens replacement. Orbits are otherwise unremarkable. Sinuses: Mucosal thickening throughout the paranasal sinuses with air-fluid levels in the bilateral maxillary sinuses. Other: Mastoid air cells are clear. IMPRESSION: No CT evidence of acute intracranial abnormality. Paranasal sinus disease with air-fluid levels in the bilateral maxillary sinuses. Recommend clinical correlation for acute sinusitis. Chronic microvascular ischemic changes and mild parenchymal volume loss. Electronically Signed   By: Donnice Mania M.D.   On: 01/26/2024 19:28   CT Angio Chest PE W and/or Wo  Contrast Result Date: 01/26/2024 CLINICAL DATA:  Pulmonary embolism (PE) suspected, high prob EXAM: CT ANGIOGRAPHY CHEST WITH CONTRAST TECHNIQUE: Multidetector CT imaging of the chest was performed using the standard protocol during bolus administration of intravenous contrast. Multiplanar CT image reconstructions and MIPs were obtained to evaluate the vascular anatomy. RADIATION DOSE REDUCTION: This exam was performed according to the departmental dose-optimization program which includes automated exposure control, adjustment of the mA and/or kV according to patient size and/or use of iterative reconstruction technique. CONTRAST:  75mL OMNIPAQUE  IOHEXOL  350 MG/ML SOLN COMPARISON:  None Available. FINDINGS: Cardiovascular:  No filling defects in the pulmonary arteries to suggest pulmonary emboli. Heart is normal size. Aorta is normal caliber. Moderate aortic atherosclerosis. Scattered coronary artery calcifications. Mediastinum/Nodes: No mediastinal, hilar, or axillary adenopathy. Trachea and esophagus are unremarkable. Thyroid  unremarkable. Lungs/Pleura: Nodular airspace opacity noted in the lingula as well as bilateral lower lobes. This likely reflects multifocal pneumonia. Bibasilar atelectasis. No effusions. Upper Abdomen: No acute findings Musculoskeletal: Chest wall soft tissues are unremarkable. No acute bony abnormality. Review of the MIP images confirms the above findings. IMPRESSION: Nodular airspace disease noted in the lingula and both lower lobes, right greater than left concerning for pneumonia. No evidence of pulmonary embolus. Aortic Atherosclerosis (ICD10-I70.0). Electronically Signed   By: Franky Crease M.D.   On: 01/26/2024 19:26   DG Chest Port 1 View Result Date: 01/26/2024 CLINICAL DATA:  Shortness of breath EXAM: PORTABLE CHEST 1 VIEW COMPARISON:  Chest x-ray 01/25/2024 FINDINGS: The heart size and mediastinal contours are within normal limits. Both lungs are clear. Right shoulder arthroplasty is present. IMPRESSION: No active disease. Electronically Signed   By: Greig Pique M.D.   On: 01/26/2024 17:18   DG Chest 2 View Result Date: 01/25/2024 CLINICAL DATA:  ShOB EXAM: CHEST - 2 VIEW COMPARISON:  03/17/2023 FINDINGS: Lungs are clear. Subcentimeter calcified granuloma in the posterior right lower lobe. Heart size and mediastinal contours are within normal limits. Aortic Atherosclerosis (ICD10-170.0). No effusion. Compression deformity T12 stable since 04/21/2021. IMPRESSION: No acute cardiopulmonary disease. Electronically Signed   By: JONETTA Faes M.D.   On: 01/25/2024 13:21   MM 3D SCREENING MAMMOGRAM BILATERAL BREAST Result Date: 01/19/2024 CLINICAL DATA:  Screening. EXAM: DIGITAL SCREENING BILATERAL  MAMMOGRAM WITH TOMOSYNTHESIS AND CAD TECHNIQUE: Bilateral screening digital craniocaudal and mediolateral oblique mammograms were obtained. Bilateral screening digital breast tomosynthesis was performed. The images were evaluated with computer-aided detection. COMPARISON:  Previous exam(s). ACR Breast Density Category b: There are scattered areas of fibroglandular density. FINDINGS: There are no findings suspicious for malignancy. IMPRESSION: No mammographic evidence of malignancy. A result letter of this screening mammogram will be mailed directly to the patient. RECOMMENDATION: Screening mammogram in one year. (Code:[id number]) BI-RADS CATEGORY  1: Negative. Electronically Signed   By: Alm Parkins M.D.   On: 01/19/2024 14:54   (Echo, Carotid, EGD, Colonoscopy, ERCP)    Subjective: Patient seen and examined.  She has some dry cough but denies any complaints.  She tells me she is going to a nursing home.   Discharge Exam: Vitals:   02/02/24 0440 02/02/24 0742  BP: (!) 105/52 (!) 114/48  Pulse:  64  Resp:  20  Temp:  98.5 F (36.9 C)  SpO2:  91%   Vitals:   02/01/24 2308 02/02/24 0423 02/02/24 0440 02/02/24 0742  BP: (!) 115/46 (!) 105/52 (!) 105/52 (!) 114/48  Pulse: 60 61  64  Resp: 17 18  20   Temp: 97.6 F (36.4 C) 97.9  F (36.6 C)  98.5 F (36.9 C)  TempSrc: Oral Oral  Oral  SpO2: 95% 93%  91%  Weight:      Height:        General: Pt is alert, awake, not in acute distress Pleasant to interaction.  On room air. Cardiovascular: RRR, S1/S2 +, no rubs, no gallops Respiratory: CTA bilaterally, no wheezing, no rhonchi, no added sounds. Abdominal: Soft, NT, ND, bowel sounds + Extremities: no edema, no cyanosis    The results of significant diagnostics from this hospitalization (including imaging, microbiology, ancillary and laboratory) are listed below for reference.     Microbiology: Recent Results (from the past 240 hours)  Resp panel by RT-PCR (RSV, Flu A&B, Covid)  Anterior Nasal Swab     Status: None   Collection Time: 01/25/24 11:34 AM   Specimen: Anterior Nasal Swab  Result Value Ref Range Status   SARS Coronavirus 2 by RT PCR NEGATIVE NEGATIVE Final    Comment: (NOTE) SARS-CoV-2 target nucleic acids are NOT DETECTED.  The SARS-CoV-2 RNA is generally detectable in upper respiratory specimens during the acute phase of infection. The lowest concentration of SARS-CoV-2 viral copies this assay can detect is 138 copies/mL. A negative result does not preclude SARS-Cov-2 infection and should not be used as the sole basis for treatment or other patient management decisions. A negative result may occur with  improper specimen collection/handling, submission of specimen other than nasopharyngeal swab, presence of viral mutation(s) within the areas targeted by this assay, and inadequate number of viral copies(<138 copies/mL). A negative result must be combined with clinical observations, patient history, and epidemiological information. The expected result is Negative.  Fact Sheet for Patients:  BloggerCourse.com  Fact Sheet for Healthcare Providers:  SeriousBroker.it  This test is no t yet approved or cleared by the [place]  FDA and  has been authorized for detection and/or diagnosis of SARS-CoV-2 by FDA under an Emergency Use Authorization (EUA). This EUA will remain  in effect (meaning this test can be used) for the duration of the COVID-19 declaration under Section 564(b)(1) of the Act, 21 U.S.C.section 360bbb-3(b)(1), unless the authorization is terminated  or revoked sooner.       Influenza A by PCR NEGATIVE NEGATIVE Final   Influenza B by PCR NEGATIVE NEGATIVE Final    Comment: (NOTE) The Xpert Xpress SARS-CoV-2/FLU/RSV plus assay is intended as an aid in the diagnosis of influenza from Nasopharyngeal swab specimens and should not be used as a sole basis for treatment. Nasal washings  and aspirates are unacceptable for Xpert Xpress SARS-CoV-2/FLU/RSV testing.  Fact Sheet for Patients: BloggerCourse.com  Fact Sheet for Healthcare Providers: SeriousBroker.it  This test is not yet approved or cleared by the [place]  FDA and has been authorized for detection and/or diagnosis of SARS-CoV-2 by FDA under an Emergency Use Authorization (EUA). This EUA will remain in effect (meaning this test can be used) for the duration of the COVID-19 declaration under Section 564(b)(1) of the Act, 21 U.S.C. section 360bbb-3(b)(1), unless the authorization is terminated or revoked.     Resp Syncytial Virus by PCR NEGATIVE NEGATIVE Final    Comment: (NOTE) Fact Sheet for Patients: BloggerCourse.com  Fact Sheet for Healthcare Providers: SeriousBroker.it  This test is not yet approved or cleared by the [place]  FDA and has been authorized for detection and/or diagnosis of SARS-CoV-2 by FDA under an Emergency Use Authorization (EUA). This EUA will remain in effect (meaning this test can be used) for the duration of  the COVID-19 declaration under Section 564(b)(1) of the Act, 21 U.S.C. section 360bbb-3(b)(1), unless the authorization is terminated or revoked.  Performed at [facility], [address] [place], [place] [address]   Expectorated Sputum Assessment w Gram Stain, Rflx to Resp Cult     Status: None   Collection Time: 01/26/24  8:18 PM   Specimen: Sputum  Result Value Ref Range Status   Specimen Description SPUTUM  Final   Special Requests NONE  Final   Sputum evaluation   Final    Sputum specimen not acceptable for testing.  Please recollect.   Gram Stain Report Called to,Read Back By and Verified With: RN C. FRANKLIN [id number] @ [phone] Performed at [facility], [address] [place], [place] [address]    Report Status 01/27/2024 FINAL   Final  Respiratory (~20 pathogens) panel by PCR     Status: Abnormal   Collection Time: 01/26/24  8:45 PM   Specimen: Nasopharyngeal Swab; Respiratory  Result Value Ref Range Status   Adenovirus NOT DETECTED NOT DETECTED Final   Coronavirus 229E NOT DETECTED NOT DETECTED Final    Comment: (NOTE) The Coronavirus on the Respiratory Panel, DOES NOT test for the novel  Coronavirus (2019 nCoV)    Coronavirus HKU1 NOT DETECTED NOT DETECTED Final   Coronavirus NL63 NOT DETECTED NOT DETECTED Final   Coronavirus OC43 NOT DETECTED NOT DETECTED Final   Metapneumovirus DETECTED (A) NOT DETECTED Final   Rhinovirus / Enterovirus NOT DETECTED NOT DETECTED Final   Influenza A NOT DETECTED NOT DETECTED Final   Influenza B NOT DETECTED NOT DETECTED Final   Parainfluenza Virus 1 NOT DETECTED NOT DETECTED Final   Parainfluenza Virus 2 NOT DETECTED NOT DETECTED Final   Parainfluenza Virus 3 NOT DETECTED NOT DETECTED Final   Parainfluenza Virus 4 NOT DETECTED NOT DETECTED Final   Respiratory Syncytial Virus NOT DETECTED NOT DETECTED Final   Bordetella pertussis NOT DETECTED NOT DETECTED Final   Bordetella Parapertussis NOT DETECTED NOT DETECTED Final   Chlamydophila pneumoniae NOT DETECTED NOT DETECTED Final   Mycoplasma pneumoniae NOT DETECTED NOT DETECTED Final    Comment: Performed at [facility], [address], [place], [place] [address]  Culture, blood (routine x 2) Call MD if unable to obtain prior to antibiotics being given     Status: None   Collection Time: 01/26/24  8:55 PM   Specimen: BLOOD  Result Value Ref Range Status   Specimen Description BLOOD RIGHT ANTECUBITAL  Final   Special Requests   Final    BOTTLES DRAWN AEROBIC AND ANAEROBIC Blood Culture adequate volume   Culture   Final    NO GROWTH 5 DAYS Performed at [facility], [address], [place], [place] [address]    Report Status 01/31/2024 FINAL  Final  Culture, blood (routine x 2) Call MD if unable to obtain  prior to antibiotics being given     Status: None   Collection Time: 01/26/24  9:01 PM   Specimen: BLOOD RIGHT WRIST  Result Value Ref Range Status   Specimen Description BLOOD RIGHT WRIST  Final   Special Requests   Final    BOTTLES DRAWN AEROBIC AND ANAEROBIC Blood Culture adequate volume   Culture   Final    NO GROWTH 5 DAYS Performed at [facility], [address], [place], [place] [address]    Report Status 01/31/2024 FINAL  Final  MRSA Next Gen by  PCR, Nasal     Status: None   Collection Time: 01/26/24 11:05 PM   Specimen: Nasal Mucosa; Nasal Swab  Result Value Ref Range Status   MRSA by PCR Next Gen NOT DETECTED NOT DETECTED Final    Comment: (NOTE) The GeneXpert MRSA Assay (FDA approved for NASAL specimens only), is one component of a comprehensive MRSA colonization surveillance program. It is not intended to diagnose MRSA infection nor to guide or monitor treatment for MRSA infections. Test performance is not FDA approved in patients less than 64 years old. Performed at [facility], [address], [place], [place] [address]      Labs: BNP (last 3 results) Recent Labs    01/26/24 1645  BNP 338.0*   Basic Metabolic Panel: Recent Labs  Lab 01/26/24 1645 01/27/24 0241 01/29/24 1546  NA 137 136 136  K 3.8 4.2 4.1  CL 101 103 100  CO2 21* 23 27  GLUCOSE 129* 195* 100*  BUN 12 11 11   CREATININE 0.76 0.79 0.67  CALCIUM  8.6* 8.7* 8.9  MG  --   --  2.2   Liver Function Tests: No results for input(s): AST, ALT, ALKPHOS, BILITOT, PROT, ALBUMIN in the last 168 hours. No results for input(s): LIPASE, AMYLASE in the last 168 hours. No results for input(s): AMMONIA in the last 168 hours. CBC: Recent Labs  Lab 01/26/24 1645 01/27/24 0241  WBC 6.0 4.3  NEUTROABS 3.7  --   HGB 12.5 12.4  HCT 38.7 37.4  MCV 96.5 94.2  PLT 101* 107*   Cardiac Enzymes: No results for input(s): CKTOTAL, CKMB, CKMBINDEX, TROPONINI in the  last 168 hours. BNP: Invalid input(s): POCBNP CBG: Recent Labs  Lab 01/26/24 1816  GLUCAP 151*   D-Dimer No results for input(s): DDIMER in the last 72 hours. Hgb A1c No results for input(s): HGBA1C in the last 72 hours. Lipid Profile No results for input(s): CHOL, HDL, LDLCALC, TRIG, CHOLHDL, LDLDIRECT in the last 72 hours. Thyroid  function studies No results for input(s): TSH, T4TOTAL, T3FREE, THYROIDAB in the last 72 hours.  Invalid input(s): FREET3 Anemia work up No results for input(s): VITAMINB12, FOLATE, FERRITIN, TIBC, IRON, RETICCTPCT in the last 72 hours. Urinalysis    Component Value Date/Time   COLORURINE YELLOW 01/26/2024 2003   APPEARANCEUR HAZY (A) 01/26/2024 2003   LABSPEC 1.021 01/26/2024 2003   PHURINE 6.0 01/26/2024 2003   GLUCOSEU NEGATIVE 01/26/2024 2003   HGBUR NEGATIVE 01/26/2024 2003   BILIRUBINUR NEGATIVE 01/26/2024 2003   BILIRUBINUR negative 02/28/2022 1420   KETONESUR 5 (A) 01/26/2024 2003   PROTEINUR NEGATIVE 01/26/2024 2003   UROBILINOGEN 0.2 02/28/2022 1420   UROBILINOGEN 0.2 09/07/2013 0006   NITRITE NEGATIVE 01/26/2024 2003   LEUKOCYTESUR TRACE (A) 01/26/2024 2003   Sepsis Labs Recent Labs  Lab 01/26/24 1645 01/27/24 0241  WBC 6.0 4.3   Microbiology Recent Results (from the past 240 hours)  Resp panel by RT-PCR (RSV, Flu A&B, Covid) Anterior Nasal Swab     Status: None   Collection Time: 01/25/24 11:34 AM   Specimen: Anterior Nasal Swab  Result Value Ref Range Status   SARS Coronavirus 2 by RT PCR NEGATIVE NEGATIVE Final    Comment: (NOTE) SARS-CoV-2 target nucleic acids are NOT DETECTED.  The SARS-CoV-2 RNA is generally detectable in upper respiratory specimens during the acute phase of infection. The lowest concentration of SARS-CoV-2 viral copies this assay can detect is 138 copies/mL. A negative result does not preclude SARS-Cov-2 infection and  should not be used as the sole basis  for treatment or other patient management decisions. A negative result may occur with  improper specimen collection/handling, submission of specimen other than nasopharyngeal swab, presence of viral mutation(s) within the areas targeted by this assay, and inadequate number of viral copies(<138 copies/mL). A negative result must be combined with clinical observations, patient history, and epidemiological information. The expected result is Negative.  Fact Sheet for Patients:  BloggerCourse.com  Fact Sheet for Healthcare Providers:  SeriousBroker.it  This test is no t yet approved or cleared by the [place]  FDA and  has been authorized for detection and/or diagnosis of SARS-CoV-2 by FDA under an Emergency Use Authorization (EUA). This EUA will remain  in effect (meaning this test can be used) for the duration of the COVID-19 declaration under Section 564(b)(1) of the Act, 21 U.S.C.section 360bbb-3(b)(1), unless the authorization is terminated  or revoked sooner.       Influenza A by PCR NEGATIVE NEGATIVE Final   Influenza B by PCR NEGATIVE NEGATIVE Final    Comment: (NOTE) The Xpert Xpress SARS-CoV-2/FLU/RSV plus assay is intended as an aid in the diagnosis of influenza from Nasopharyngeal swab specimens and should not be used as a sole basis for treatment. Nasal washings and aspirates are unacceptable for Xpert Xpress SARS-CoV-2/FLU/RSV testing.  Fact Sheet for Patients: BloggerCourse.com  Fact Sheet for Healthcare Providers: SeriousBroker.it  This test is not yet approved or cleared by the [place]  FDA and has been authorized for detection and/or diagnosis of SARS-CoV-2 by FDA under an Emergency Use Authorization (EUA). This EUA will remain in effect (meaning this test can be used) for the duration of the COVID-19 declaration under Section 564(b)(1) of the Act, 21  U.S.C. section 360bbb-3(b)(1), unless the authorization is terminated or revoked.     Resp Syncytial Virus by PCR NEGATIVE NEGATIVE Final    Comment: (NOTE) Fact Sheet for Patients: BloggerCourse.com  Fact Sheet for Healthcare Providers: SeriousBroker.it  This test is not yet approved or cleared by the [place]  FDA and has been authorized for detection and/or diagnosis of SARS-CoV-2 by FDA under an Emergency Use Authorization (EUA). This EUA will remain in effect (meaning this test can be used) for the duration of the COVID-19 declaration under Section 564(b)(1) of the Act, 21 U.S.C. section 360bbb-3(b)(1), unless the authorization is terminated or revoked.  Performed at [facility], [address] [place], [place] [address]   Expectorated Sputum Assessment w Gram Stain, Rflx to Resp Cult     Status: None   Collection Time: 01/26/24  8:18 PM   Specimen: Sputum  Result Value Ref Range Status   Specimen Description SPUTUM  Final   Special Requests NONE  Final   Sputum evaluation   Final    Sputum specimen not acceptable for testing.  Please recollect.   Gram Stain Report Called to,Read Back By and Verified With: RN C. FRANKLIN [id number] @ [phone] FH Performed at [facility], [address] [place], [place] [address]    Report Status 01/27/2024 FINAL  Final  Respiratory (~20 pathogens) panel by PCR     Status: Abnormal   Collection Time: 01/26/24  8:45 PM   Specimen: Nasopharyngeal Swab; Respiratory  Result Value Ref Range Status   Adenovirus NOT DETECTED NOT DETECTED Final   Coronavirus 229E NOT DETECTED NOT DETECTED Final    Comment: (NOTE) The Coronavirus on the Respiratory Panel, DOES NOT test for the novel  Coronavirus (2019  nCoV)    Coronavirus HKU1 NOT DETECTED NOT DETECTED Final   Coronavirus NL63 NOT DETECTED NOT DETECTED Final   Coronavirus OC43 NOT DETECTED NOT DETECTED Final    Metapneumovirus DETECTED (A) NOT DETECTED Final   Rhinovirus / Enterovirus NOT DETECTED NOT DETECTED Final   Influenza A NOT DETECTED NOT DETECTED Final   Influenza B NOT DETECTED NOT DETECTED Final   Parainfluenza Virus 1 NOT DETECTED NOT DETECTED Final   Parainfluenza Virus 2 NOT DETECTED NOT DETECTED Final   Parainfluenza Virus 3 NOT DETECTED NOT DETECTED Final   Parainfluenza Virus 4 NOT DETECTED NOT DETECTED Final   Respiratory Syncytial Virus NOT DETECTED NOT DETECTED Final   Bordetella pertussis NOT DETECTED NOT DETECTED Final   Bordetella Parapertussis NOT DETECTED NOT DETECTED Final   Chlamydophila pneumoniae NOT DETECTED NOT DETECTED Final   Mycoplasma pneumoniae NOT DETECTED NOT DETECTED Final    Comment: Performed at [facility], [address], [place], [place] [address]  Culture, blood (routine x 2) Call MD if unable to obtain prior to antibiotics being given     Status: None   Collection Time: 01/26/24  8:55 PM   Specimen: BLOOD  Result Value Ref Range Status   Specimen Description BLOOD RIGHT ANTECUBITAL  Final   Special Requests   Final    BOTTLES DRAWN AEROBIC AND ANAEROBIC Blood Culture adequate volume   Culture   Final    NO GROWTH 5 DAYS Performed at [facility], [address] [place], [place] [address]    Report Status 01/31/2024 FINAL  Final  Culture, blood (routine x 2) Call MD if unable to obtain prior to antibiotics being given     Status: None   Collection Time: 01/26/24  9:01 PM   Specimen: BLOOD RIGHT WRIST  Result Value Ref Range Status   Specimen Description BLOOD RIGHT WRIST  Final   Special Requests   Final    BOTTLES DRAWN AEROBIC AND ANAEROBIC Blood Culture adequate volume   Culture   Final    NO GROWTH 5 DAYS Performed at [facility], [address] [place], [place] [address]    Report Status 01/31/2024 FINAL  Final  MRSA Next Gen by PCR, Nasal     Status: None   Collection Time: 01/26/24 11:05 PM   Specimen: Nasal  Mucosa; Nasal Swab  Result Value Ref Range Status   MRSA by PCR Next Gen NOT DETECTED NOT DETECTED Final    Comment: (NOTE) The GeneXpert MRSA Assay (FDA approved for NASAL specimens only), is one component of a comprehensive MRSA colonization surveillance program. It is not intended to diagnose MRSA infection nor to guide or monitor treatment for MRSA infections. Test performance is not FDA approved in patients less than 85 years old. Performed at [facility], [address] [place], [place] [address]      Time coordinating discharge: 40 minutes  SIGNED:   Renato Applebaum, MD  [facility] 02/02/2024, 10:29 AM

## 2024-02-02 NOTE — TOC Progression Note (Signed)
 Transition of Care [facility]) - Progression Note    Patient Details  Name: Cassie White MRN: [id number] Date of Birth: January 11, 1941  [facility] CM/SW Contact  Lauraine FORBES Saa, LCSW Phone Number: 02/02/2024, 10:26 AM  Clinical Narrative:     10:27 AM Patient's insurance authorization for SNF was approved and is valid 02/01/2024-02/03/2024. CSW made medical team aware. [facility] SNF admissions made aware of patient's discharge today.  Expected Discharge Plan: Skilled Nursing Facility Barriers to Discharge: Continued Medical Work up  Expected Discharge Plan and Services                                               Social Determinants of Health (SDOH) Interventions SDOH Screenings   Food Insecurity: No Food Insecurity (01/27/2024)  Housing: Patient Declined (01/27/2024)  Transportation Needs: Patient Declined (01/27/2024)  Utilities: Patient Declined (01/27/2024)  Alcohol Screen: Low Risk  (07/22/2023)  Depression (PHQ2-9): Low Risk  (09/18/2023)  Financial Resource Strain: Low Risk  (07/22/2023)  Physical Activity: Inactive (07/22/2023)  Social Connections: Moderately Isolated (07/22/2023)  Stress: No Stress Concern Present (07/22/2023)  Tobacco Use: Low Risk  (01/26/2024)  Health Literacy: Adequate Health Literacy (07/22/2023)    Readmission Risk Interventions     No data to display

## 2024-02-02 NOTE — Progress Notes (Signed)
 Report called to [facility] at [phone]

## 2024-02-02 NOTE — TOC Transition Note (Signed)
 Transition of Care [facility]) - Discharge Note   Patient Details  Name: Cassie White MRN: [id number] Date of Birth: 06-14-1941  Transition of Care [facility]) CM/SW Contact:  Lauraine FORBES Saa, LCSW Phone Number: 02/02/2024, 11:43 AM   Clinical Narrative:     Patient will DC to: [facility] SNF Anticipated DC date: 02/02/2024 Family notified: Arland Speaks; Daughter; [phone] Transport by: [facility]   Per MD patient ready for DC to [facility]. RN to call report prior to discharge [phone]). RN, patient, patient's family, and facility notified of DC. Discharge Summary and FL2 sent to facility. DC packet on chart. Ambulance transport requested for patient at 11:43.  CSW will sign off for now as social work intervention is no longer needed. Please consult us  again if new needs arise.    Final next level of care: Skilled Nursing Facility Barriers to Discharge: Barriers Resolved   Patient Goals and CMS Choice Patient states their goals for this hospitalization and ongoing recovery are:: plan to go [facility]          Discharge Placement              Patient chooses bed at: [facility] Patient to be transferred to facility by: [facility] Name of family member notified: Arland Speaks; Daughter; [phone] Patient and family notified of of transfer: 02/02/24  Discharge Plan and Services Additional resources added to the After Visit Summary for                                       Social Drivers of Health (SDOH) Interventions SDOH Screenings   Food Insecurity: No Food Insecurity (01/27/2024)  Housing: Patient Declined (01/27/2024)  Transportation Needs: Patient Declined (01/27/2024)  Utilities: Patient Declined (01/27/2024)  Alcohol Screen: Low Risk  (07/22/2023)  Depression (PHQ2-9): Low Risk  (09/18/2023)  Financial Resource Strain: Low Risk  (07/22/2023)  Physical Activity: Inactive (07/22/2023)  Social Connections:  Moderately Isolated (07/22/2023)  Stress: No Stress Concern Present (07/22/2023)  Tobacco Use: Low Risk  (01/26/2024)  Health Literacy: Adequate Health Literacy (07/22/2023)     Readmission Risk Interventions     No data to display

## 2024-02-03 DIAGNOSIS — G629 Polyneuropathy, unspecified: Secondary | ICD-10-CM | POA: Diagnosis not present

## 2024-02-03 DIAGNOSIS — R5381 Other malaise: Secondary | ICD-10-CM | POA: Diagnosis not present

## 2024-02-03 DIAGNOSIS — E538 Deficiency of other specified B group vitamins: Secondary | ICD-10-CM | POA: Diagnosis not present

## 2024-02-03 DIAGNOSIS — I10 Essential (primary) hypertension: Secondary | ICD-10-CM | POA: Diagnosis not present

## 2024-02-03 DIAGNOSIS — E785 Hyperlipidemia, unspecified: Secondary | ICD-10-CM | POA: Diagnosis not present

## 2024-02-03 DIAGNOSIS — I480 Paroxysmal atrial fibrillation: Secondary | ICD-10-CM | POA: Diagnosis not present

## 2024-02-03 DIAGNOSIS — G20A1 Parkinson's disease without dyskinesia, without mention of fluctuations: Secondary | ICD-10-CM | POA: Diagnosis not present

## 2024-02-03 DIAGNOSIS — G4700 Insomnia, unspecified: Secondary | ICD-10-CM | POA: Diagnosis not present

## 2024-02-03 LAB — CBC: RBC: 3.73 — AB (ref 3.87–5.11)

## 2024-02-03 LAB — CBC AND DIFFERENTIAL
HCT: 94 — AB (ref 36–46)
Hemoglobin: 11.7 — AB (ref 12.0–16.0)
Platelets: 229 K/uL (ref 150–400)
WBC: 7.1

## 2024-02-03 LAB — BASIC METABOLIC PANEL WITH GFR
BUN: 12 (ref 4–21)
CO2: 24 — AB (ref 13–22)
Chloride: 103 (ref 99–108)
Creatinine: 0.8 (ref 0.5–1.1)
Glucose: 84
Potassium: 4.3 meq/L (ref 3.5–5.1)
Sodium: 138 (ref 137–147)

## 2024-02-03 LAB — COMPREHENSIVE METABOLIC PANEL WITH GFR
Albumin: 2.9 — AB (ref 3.5–5.0)
Globulin: 2.6
eGFR: 80

## 2024-02-03 LAB — IRON,TIBC AND FERRITIN PANEL
%SAT: 28.84
Ferritin: 457
Iron: 46
TIBC: 159
UIBC: 113

## 2024-02-03 LAB — HEPATIC FUNCTION PANEL
ALT: 9 U/L (ref 7–35)
AST: 24 (ref 13–35)
Alkaline Phosphatase: 63 (ref 25–125)

## 2024-02-03 LAB — TSH: TSH: 1.5 (ref 0.41–5.90)

## 2024-02-03 LAB — HEMOGLOBIN A1C: Hemoglobin A1C: 5.4

## 2024-02-03 LAB — VITAMIN D 25 HYDROXY (VIT D DEFICIENCY, FRACTURES): Vit D, 25-Hydroxy: 62.1

## 2024-02-03 LAB — VITAMIN B12: Vitamin B-12: 1536

## 2024-02-04 DIAGNOSIS — G629 Polyneuropathy, unspecified: Secondary | ICD-10-CM | POA: Diagnosis not present

## 2024-02-04 DIAGNOSIS — E538 Deficiency of other specified B group vitamins: Secondary | ICD-10-CM | POA: Diagnosis not present

## 2024-02-04 DIAGNOSIS — E785 Hyperlipidemia, unspecified: Secondary | ICD-10-CM | POA: Diagnosis not present

## 2024-02-04 DIAGNOSIS — R5381 Other malaise: Secondary | ICD-10-CM | POA: Diagnosis not present

## 2024-02-04 DIAGNOSIS — G4700 Insomnia, unspecified: Secondary | ICD-10-CM | POA: Diagnosis not present

## 2024-02-04 DIAGNOSIS — G20A1 Parkinson's disease without dyskinesia, without mention of fluctuations: Secondary | ICD-10-CM | POA: Diagnosis not present

## 2024-02-04 DIAGNOSIS — I10 Essential (primary) hypertension: Secondary | ICD-10-CM | POA: Diagnosis not present

## 2024-02-04 DIAGNOSIS — I480 Paroxysmal atrial fibrillation: Secondary | ICD-10-CM | POA: Diagnosis not present

## 2024-02-04 DIAGNOSIS — J9601 Acute respiratory failure with hypoxia: Secondary | ICD-10-CM | POA: Diagnosis not present

## 2024-02-10 DIAGNOSIS — M7918 Myalgia, other site: Secondary | ICD-10-CM | POA: Diagnosis not present

## 2024-02-10 DIAGNOSIS — G4700 Insomnia, unspecified: Secondary | ICD-10-CM | POA: Diagnosis not present

## 2024-02-10 DIAGNOSIS — G629 Polyneuropathy, unspecified: Secondary | ICD-10-CM | POA: Diagnosis not present

## 2024-02-10 DIAGNOSIS — R5381 Other malaise: Secondary | ICD-10-CM | POA: Diagnosis not present

## 2024-02-10 DIAGNOSIS — I10 Essential (primary) hypertension: Secondary | ICD-10-CM | POA: Diagnosis not present

## 2024-02-10 DIAGNOSIS — E785 Hyperlipidemia, unspecified: Secondary | ICD-10-CM | POA: Diagnosis not present

## 2024-02-10 DIAGNOSIS — I480 Paroxysmal atrial fibrillation: Secondary | ICD-10-CM | POA: Diagnosis not present

## 2024-02-10 DIAGNOSIS — G20A1 Parkinson's disease without dyskinesia, without mention of fluctuations: Secondary | ICD-10-CM | POA: Diagnosis not present

## 2024-02-10 DIAGNOSIS — E538 Deficiency of other specified B group vitamins: Secondary | ICD-10-CM | POA: Diagnosis not present

## 2024-02-15 DIAGNOSIS — I10 Essential (primary) hypertension: Secondary | ICD-10-CM | POA: Diagnosis not present

## 2024-02-15 DIAGNOSIS — G20A1 Parkinson's disease without dyskinesia, without mention of fluctuations: Secondary | ICD-10-CM | POA: Diagnosis not present

## 2024-02-15 DIAGNOSIS — R5381 Other malaise: Secondary | ICD-10-CM | POA: Diagnosis not present

## 2024-02-15 DIAGNOSIS — E785 Hyperlipidemia, unspecified: Secondary | ICD-10-CM | POA: Diagnosis not present

## 2024-02-15 DIAGNOSIS — I480 Paroxysmal atrial fibrillation: Secondary | ICD-10-CM | POA: Diagnosis not present

## 2024-02-20 DIAGNOSIS — G20A1 Parkinson's disease without dyskinesia, without mention of fluctuations: Secondary | ICD-10-CM | POA: Diagnosis not present

## 2024-02-20 DIAGNOSIS — R0782 Intercostal pain: Secondary | ICD-10-CM | POA: Diagnosis not present

## 2024-02-20 DIAGNOSIS — E538 Deficiency of other specified B group vitamins: Secondary | ICD-10-CM | POA: Diagnosis not present

## 2024-02-22 DIAGNOSIS — E538 Deficiency of other specified B group vitamins: Secondary | ICD-10-CM | POA: Diagnosis not present

## 2024-02-22 DIAGNOSIS — N3281 Overactive bladder: Secondary | ICD-10-CM | POA: Diagnosis not present

## 2024-02-22 DIAGNOSIS — G20A1 Parkinson's disease without dyskinesia, without mention of fluctuations: Secondary | ICD-10-CM | POA: Diagnosis not present

## 2024-02-22 DIAGNOSIS — I10 Essential (primary) hypertension: Secondary | ICD-10-CM | POA: Diagnosis not present

## 2024-02-22 DIAGNOSIS — G8929 Other chronic pain: Secondary | ICD-10-CM | POA: Diagnosis not present

## 2024-02-22 DIAGNOSIS — G4700 Insomnia, unspecified: Secondary | ICD-10-CM | POA: Diagnosis not present

## 2024-02-22 DIAGNOSIS — I480 Paroxysmal atrial fibrillation: Secondary | ICD-10-CM | POA: Diagnosis not present

## 2024-02-22 DIAGNOSIS — E785 Hyperlipidemia, unspecified: Secondary | ICD-10-CM | POA: Diagnosis not present

## 2024-02-22 DIAGNOSIS — G629 Polyneuropathy, unspecified: Secondary | ICD-10-CM | POA: Diagnosis not present

## 2024-02-22 DIAGNOSIS — M549 Dorsalgia, unspecified: Secondary | ICD-10-CM | POA: Diagnosis not present

## 2024-02-24 DIAGNOSIS — G20A1 Parkinson's disease without dyskinesia, without mention of fluctuations: Secondary | ICD-10-CM | POA: Diagnosis not present

## 2024-02-24 DIAGNOSIS — G8929 Other chronic pain: Secondary | ICD-10-CM | POA: Diagnosis not present

## 2024-02-24 DIAGNOSIS — I480 Paroxysmal atrial fibrillation: Secondary | ICD-10-CM | POA: Diagnosis not present

## 2024-02-24 DIAGNOSIS — M549 Dorsalgia, unspecified: Secondary | ICD-10-CM | POA: Diagnosis not present

## 2024-02-24 DIAGNOSIS — E785 Hyperlipidemia, unspecified: Secondary | ICD-10-CM | POA: Diagnosis not present

## 2024-02-24 DIAGNOSIS — G629 Polyneuropathy, unspecified: Secondary | ICD-10-CM | POA: Diagnosis not present

## 2024-02-24 DIAGNOSIS — E538 Deficiency of other specified B group vitamins: Secondary | ICD-10-CM | POA: Diagnosis not present

## 2024-02-24 DIAGNOSIS — G4700 Insomnia, unspecified: Secondary | ICD-10-CM | POA: Diagnosis not present

## 2024-02-25 DIAGNOSIS — I480 Paroxysmal atrial fibrillation: Secondary | ICD-10-CM | POA: Diagnosis not present

## 2024-02-25 DIAGNOSIS — E785 Hyperlipidemia, unspecified: Secondary | ICD-10-CM | POA: Diagnosis not present

## 2024-02-25 DIAGNOSIS — I10 Essential (primary) hypertension: Secondary | ICD-10-CM | POA: Diagnosis not present

## 2024-02-26 DIAGNOSIS — M6281 Muscle weakness (generalized): Secondary | ICD-10-CM | POA: Diagnosis not present

## 2024-02-27 DIAGNOSIS — M6281 Muscle weakness (generalized): Secondary | ICD-10-CM | POA: Diagnosis not present

## 2024-02-29 DIAGNOSIS — M6281 Muscle weakness (generalized): Secondary | ICD-10-CM | POA: Diagnosis not present

## 2024-03-02 DIAGNOSIS — M6281 Muscle weakness (generalized): Secondary | ICD-10-CM | POA: Diagnosis not present

## 2024-03-03 DIAGNOSIS — M6281 Muscle weakness (generalized): Secondary | ICD-10-CM | POA: Diagnosis not present

## 2024-03-04 DIAGNOSIS — R2681 Unsteadiness on feet: Secondary | ICD-10-CM | POA: Diagnosis not present

## 2024-03-06 DIAGNOSIS — R2681 Unsteadiness on feet: Secondary | ICD-10-CM | POA: Diagnosis not present

## 2024-03-08 DIAGNOSIS — M6281 Muscle weakness (generalized): Secondary | ICD-10-CM | POA: Diagnosis not present

## 2024-03-08 DIAGNOSIS — R2681 Unsteadiness on feet: Secondary | ICD-10-CM | POA: Diagnosis not present

## 2024-03-09 DIAGNOSIS — R2681 Unsteadiness on feet: Secondary | ICD-10-CM | POA: Diagnosis not present

## 2024-03-11 ENCOUNTER — Ambulatory Visit (INDEPENDENT_AMBULATORY_CARE_PROVIDER_SITE_OTHER): Payer: Medicare Other | Admitting: Family Medicine

## 2024-03-11 ENCOUNTER — Encounter: Payer: Self-pay | Admitting: Family Medicine

## 2024-03-11 VITALS — BP 113/63 | HR 61 | Temp 97.5°F | Ht 61.0 in | Wt 137.4 lb

## 2024-03-11 DIAGNOSIS — I10 Essential (primary) hypertension: Secondary | ICD-10-CM | POA: Diagnosis not present

## 2024-03-11 DIAGNOSIS — M810 Age-related osteoporosis without current pathological fracture: Secondary | ICD-10-CM | POA: Diagnosis not present

## 2024-03-11 DIAGNOSIS — Z8709 Personal history of other diseases of the respiratory system: Secondary | ICD-10-CM

## 2024-03-11 DIAGNOSIS — E7800 Pure hypercholesterolemia, unspecified: Secondary | ICD-10-CM

## 2024-03-11 NOTE — Progress Notes (Signed)
 OFFICE VISIT  03/20/2024  CC:  Chief Complaint  Patient presents with   Medical Management of Chronic Issues    Patient is a 83 y.o. female who presents accompanied by her daughter for 13-month follow-up hypertension and hyperlipidemia and osteoporosis. A/P as of last visit: 1 hypertension, well-controlled on Lopressor  25 mg twice daily. Electrolytes and creatinine today.   2.  Hypercholesterolemia, doing well on pravastatin  40 mg a day. Not fasting today. Lipid panel and hepatic panel today.   3.  Osteoporosis.  October 2022 started raloxifene  for her osteoporosis. Tolerating this well.  rpt DEXA ordered today, to be scheduled with her mammogram in June this year.  INTERIM HX:  She was admitted to the hospital about 6 weeks ago for pneumonia and sepsis.  Patient was found to have metapneumovirus infection.  She was discharged to rehab center without the need for oxygen. She has been doing well, living at [facility].  Eating and drinking well.  No shortness of breath.  No cough, no chest pain.  No dizziness.  She usually ambulates with a cane or walker.  ROS as above, plus--> no fevers,  no HAs, no rashes, no melena/hematochezia.  No polyuria or polydipsia.  No myalgias or arthralgias.    No acute vision or hearing abnormalities.  No dysuria or unusual/new urinary urgency or frequency.  No recent changes in lower legs. No n/v/d or abd pain.  No palpitations.    Past Medical History:  Diagnosis Date   Abnormal mammogram of left breast    Likely benign microcalcifications--repeat L diag mammo 03/24/2017.  COMPLEX SCLEROSING LESION WITH CALCIFICATIONS --no sign of malignancy.  Excision recommended--pt has been referred to Dr. Ebbie, who recommended f/u mammo and this was normal 12/2017--consider rpt 1 yr per rad.   Arthritis    DJD, back and both hips   Chronic low back pain without sciatica    Lumbar spondylosis + scoliosis.  Summer 2017 Dr. Bonner did  ESI and pt got no relief.  Pt then saw Dr. Darlean [facility]: felt she had facet mediated pain; plan for B L345 MBB (??).   COVID 08/2022   Cystocele, midline    Diverticulosis    on colonoscopy 2004   GERD (gastroesophageal reflux disease)    Hx of cardiovascular stress test    Lexiscan  Myoview  (2/14):  Low risk, small ant defect likely breast attenuation, no ischemia, EF 69% (no change from 2010).     Hyperlipidemia    Hypertension    per pt   Idiopathic scoliosis of thoracolumbar region    Lumbar back pain    Mitral regurgitation    Echo (2/14):  EF 60-65%, Gr 1 DD, mild AI, mild bileaflet MVP, mod post directed MR, mild LAE, PASP 35.   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  Responding to gabapentin  (Dr. Chalice).   Osteoporosis, senile    stable left hip 2015 (took fosamax  x 36yrs, T-score -2.7 when she stopped bisphosphonate).  Rpt DEXA 85mo later (09/2016) T-score -.3.1 (different machine, though). 11/2019 DEXA T-score -4. Recommended restart of fosamax    PAF (paroxysmal atrial fibrillation) (HCC)    Dr. [name]: Flecainide , metopr, apxiaban.  08/2017-->persistent a-fib w/RVR, flecainide  increased and she converted back to NSR and felt better.  100mg  bid flecainide  continued.   Parkinson's disease (HCC) 2020 dx   started sinemet  05/2019 (Dr. Evonnie)   Peripheral neuropathy 2008  Idiopathic vs familial (motor>sensory) vs lumbar disc dz: gabapentin  helpful   Shoulder pain left   RC surg 09/2014   Unsteady gait     Past Surgical History:  Procedure Laterality Date   BREAST BIOPSY  04/10/2017   Left breast core needle biopsy of calcifications.  COMPLEX SCLEROSING LESION WITH CALCIFICATIONS --no sign of malignancy.  Excision recommended--pt has been referred to Dr. Ebbie, who recommended rpt mammo and this was NORMAL 12/2017.   BREAST EXCISIONAL BIOPSY Right 2010   Benign   BREAST  SURGERY  Nov 2010   Benign biopsy, right   CHOLECYSTECTOMY  03/2010   Dr.Gross   COLONOSCOPY  10/03/2002   No polyps.  Repeat 10 yrs recommended but pt declines.  Pt declined cologuard 08/2016 but changed her mind and this test was NEG on 03/23/17.   COMBINED HYSTERECTOMY VAGINAL / OOPHORECTOMY / A&P REPAIR  1990   uncertain if ovaries were removed or not   CYSTOCELE REPAIR     DEXA  11/2019   T score -4   Lumpectomy  06/2009   Fatty Necrosis   PFT's  2014   NORMAL   REVERSE SHOULDER ARTHROPLASTY Right 03/11/2019   Procedure: REVERSE TOTAL SHOULDER ARTHROPLASTY;  Surgeon: Kay Kemps, MD;  Location: [facility];  Service: Orthopedics;  Laterality: Right;  Intersclene block   ROTATOR CUFF REPAIR Left 10/02/14   Dr. Kay   TONSILLECTOMY     TOTAL KNEE ARTHROPLASTY  09/2009   Right ;Dr Ernie   TOTAL KNEE ARTHROPLASTY Left 03/10/2016   Procedure: TOTAL KNEE ARTHROPLASTY;  Surgeon: Donnice Ernie, MD;  Location: [facility];  Service: Orthopedics;  Laterality: Left;   TRANSTHORACIC ECHOCARDIOGRAM  09/2012; 09/27/15   2014: EF 60-65%, Gr 1 DD, mild AI, mild bileaflet MVP, mod post directed MR, mild LAE, PASP 35.  Repeat 2017: EF 55-60%, mild AR, mild MVP and mild MV regurg.   VAGINAL HYSTERECTOMY     For Uterine Deviation     Outpatient Medications Prior to Visit  Medication Sig Dispense Refill   betamethasone dipropionate 0.05 % lotion Apply topically as needed.     Biotin  5000 MCG CAPS Take 5,000 mcg by mouth daily.     Calcium  Carbonate (CALCIUM  600 PO) Take 600 mg by mouth 2 (two) times daily.      carbidopa -levodopa  (SINEMET  CR) 50-200 MG tablet TAKE 1 TABLET BY MOUTH EVERYDAY AT BEDTIME 90 tablet 1   carbidopa -levodopa  (SINEMET  IR) 25-100 MG tablet Take  2 at 8am, Take 2 at 11 am,  Take 2 at 2 pm and 1 at 5pm 630 tablet 0   cyanocobalamin  (VITAMIN B12) 1000 MCG/ML injection INJECT 1 ML (1,000 MCG) INTRAMUSCULARLY EVERY 30 DAYS 3 mL 6   ELIQUIS  5 MG TABS tablet TAKE 1 TABLET BY MOUTH TWICE A DAY  180 tablet 3   flecainide  (TAMBOCOR ) 100 MG tablet Take 1 tablet (100 mg total) by mouth 2 (two) times daily. 180 tablet 3   gabapentin  (NEURONTIN ) 300 MG capsule TAKE 2 CAPSULES BY MOUTH TWICE A DAY 360 capsule 1   guaiFENesin  (MUCINEX ) 600 MG 12 hr tablet Take 1 tablet (600 mg total) by mouth 2 (two) times daily.     metoprolol  tartrate (LOPRESSOR ) 25 MG tablet Take 1 tablet (25 mg total) by mouth 2 (two) times daily. 180 tablet 3   Omega-3 Fatty Acids (FISH OIL ) 1200 MG CAPS Take 1,000 mg by mouth 2 (two) times daily.      pravastatin  (PRAVACHOL ) 40 MG tablet  TAKE 1 TABLET BY MOUTH EVERY DAY 90 tablet 3   raloxifene  (EVISTA ) 60 MG tablet TAKE 1 TABLET BY MOUTH EVERY DAY 90 tablet 0   traZODone  (DESYREL ) 100 MG tablet TAKE 1 TABLET BY MOUTH EVERYDAY AT BEDTIME 90 tablet 0   nitroGLYCERIN  (NITROSTAT ) 0.4 MG SL tablet PLACE 1 TABLET UNDER THE TONGUE EVERY 5 MINUTES AS NEEDED FOR CHEST PAIN. (Patient not taking: Reported on 03/11/2024) 25 tablet 1   No facility-administered medications prior to visit.    Allergies  Allergen Reactions   Sulfa Antibiotics Swelling    face   Tizanidine  Other (See Comments)    delirium    Review of Systems As per HPI  PE:    03/11/2024    1:02 PM 02/02/2024    7:42 AM 02/02/2024    4:40 AM  Vitals with BMI  Height 5' 1    Weight 137 lbs 6 oz    BMI 25.97    Systolic 113 114 894  Diastolic 63 48 52  Pulse 61 64      Physical Exam  General: Alert and well-appearing. Affect: Pleasant, lucid thought and speech. Cardiovascular: Regular rhythm and rate with a 2 out of 6 systolic murmur.  S2 is obscured.  S1 is clear.  No diastolic murmur. Lung bases with trace end inspiratory soft crackles, left greater than right.  Good aeration.  Breathing is nonlabored. Extremities: No pitting edema.  LABS:  Last CBC Lab Results  Component Value Date   WBC 7.1 02/03/2024   HGB 11.7 (A) 02/03/2024   HCT 94 (A) 02/03/2024   MCV 94.2 01/27/2024   MCH 31.2  01/27/2024   RDW 13.2 01/27/2024   PLT 229 02/03/2024   Last metabolic panel Lab Results  Component Value Date   GLUCOSE 100 (H) 01/29/2024   NA 138 02/03/2024   K 4.3 02/03/2024   CL 103 02/03/2024   CO2 24 (A) 02/03/2024   BUN 12 02/03/2024   CREATININE 0.8 02/03/2024   GFRNONAA >60 01/29/2024   CALCIUM  8.9 01/29/2024   PROT 6.3 09/18/2023   ALBUMIN 2.9 (A) 02/03/2024   BILITOT 0.6 09/18/2023   ALKPHOS 63 02/03/2024   AST 24 02/03/2024   ALT 9 02/03/2024   ANIONGAP 9 01/29/2024   Last lipids Lab Results  Component Value Date   CHOL 154 09/18/2023   HDL 71.80 09/18/2023   LDLCALC 62 09/18/2023   LDLDIRECT 87.5 09/07/2012   TRIG 99.0 09/18/2023   CHOLHDL 2 09/18/2023   Last hemoglobin A1c Lab Results  Component Value Date   HGBA1C 5.4 02/03/2024   Last thyroid  functions Lab Results  Component Value Date   TSH 1.50 02/03/2024   Last vitamin D  Lab Results  Component Value Date   VD25OH 62.1 02/03/2024   Last vitamin B12 and Folate Lab Results  Component Value Date   VITAMINB12 1,536 02/03/2024   IMPRESSION AND PLAN:  1 hypertension, well-controlled on Lopressor  25 mg twice daily. Complete metabolic panel normal on 01/29/2024.  2.  Hypercholesterolemia, doing well on pravastatin  40 mg a day. Not fasting today. LDL was 62 approx 6 mo ago.  Hepatic panel normal on 01/29/2024. Plan rpt 6 mo.   3.  Osteoporosis.  October 2022 started raloxifene  for her osteoporosis. Tolerating this well.  DEXA in 6 mo.  4. Hx of acute hypoxic respiratory failure due to metapneumovirus. She has recovered completely and is back to baseline. Of note, she does sound like she has some scarring in the  bases of her lungs.  She is thriving at [facility].  An After Visit Summary was printed and given to the patient.  FOLLOW UP: Return in about 6 months (around 09/11/2024) for annual CPE (fasting).  Signed:  Gerlene Hockey, MD           03/20/2024

## 2024-03-14 DIAGNOSIS — I480 Paroxysmal atrial fibrillation: Secondary | ICD-10-CM | POA: Diagnosis not present

## 2024-03-14 DIAGNOSIS — G20A1 Parkinson's disease without dyskinesia, without mention of fluctuations: Secondary | ICD-10-CM | POA: Diagnosis not present

## 2024-03-14 DIAGNOSIS — E785 Hyperlipidemia, unspecified: Secondary | ICD-10-CM | POA: Diagnosis not present

## 2024-03-14 DIAGNOSIS — I10 Essential (primary) hypertension: Secondary | ICD-10-CM | POA: Diagnosis not present

## 2024-03-15 DIAGNOSIS — R2681 Unsteadiness on feet: Secondary | ICD-10-CM | POA: Diagnosis not present

## 2024-03-16 DIAGNOSIS — R2681 Unsteadiness on feet: Secondary | ICD-10-CM | POA: Diagnosis not present

## 2024-03-17 DIAGNOSIS — R2681 Unsteadiness on feet: Secondary | ICD-10-CM | POA: Diagnosis not present

## 2024-03-19 DIAGNOSIS — M6281 Muscle weakness (generalized): Secondary | ICD-10-CM | POA: Diagnosis not present

## 2024-03-19 DIAGNOSIS — R2681 Unsteadiness on feet: Secondary | ICD-10-CM | POA: Diagnosis not present

## 2024-03-20 DIAGNOSIS — R2681 Unsteadiness on feet: Secondary | ICD-10-CM | POA: Diagnosis not present

## 2024-03-21 DIAGNOSIS — R2681 Unsteadiness on feet: Secondary | ICD-10-CM | POA: Diagnosis not present

## 2024-03-22 DIAGNOSIS — G20A1 Parkinson's disease without dyskinesia, without mention of fluctuations: Secondary | ICD-10-CM | POA: Diagnosis not present

## 2024-03-22 DIAGNOSIS — R0782 Intercostal pain: Secondary | ICD-10-CM | POA: Diagnosis not present

## 2024-03-24 DIAGNOSIS — R2681 Unsteadiness on feet: Secondary | ICD-10-CM | POA: Diagnosis not present

## 2024-03-25 ENCOUNTER — Other Ambulatory Visit: Payer: Self-pay | Admitting: Neurology

## 2024-03-25 DIAGNOSIS — G20A1 Parkinson's disease without dyskinesia, without mention of fluctuations: Secondary | ICD-10-CM

## 2024-03-26 DIAGNOSIS — M6281 Muscle weakness (generalized): Secondary | ICD-10-CM | POA: Diagnosis not present

## 2024-03-26 DIAGNOSIS — R2681 Unsteadiness on feet: Secondary | ICD-10-CM | POA: Diagnosis not present

## 2024-03-29 DIAGNOSIS — M6281 Muscle weakness (generalized): Secondary | ICD-10-CM | POA: Diagnosis not present

## 2024-03-29 DIAGNOSIS — R2681 Unsteadiness on feet: Secondary | ICD-10-CM | POA: Diagnosis not present

## 2024-03-30 DIAGNOSIS — I480 Paroxysmal atrial fibrillation: Secondary | ICD-10-CM | POA: Diagnosis not present

## 2024-03-30 DIAGNOSIS — E785 Hyperlipidemia, unspecified: Secondary | ICD-10-CM | POA: Diagnosis not present

## 2024-03-30 DIAGNOSIS — R2681 Unsteadiness on feet: Secondary | ICD-10-CM | POA: Diagnosis not present

## 2024-03-30 DIAGNOSIS — I10 Essential (primary) hypertension: Secondary | ICD-10-CM | POA: Diagnosis not present

## 2024-03-30 NOTE — Progress Notes (Unsigned)
 Cardiology Office Note   Date:  04/01/2024   ID:  Cassie White, DOB [birth year], MRN [id number]  PCP:  Candise [name], MD  Cardiologist:  Danille Oppedisano [name], MD EP: None  Chief Complaint  Patient presents with   Mitral Valve Prolapse      History of Present Illness: Cassie White is a 83 y.o. female with PMH of paroxysmal atrial fibrillation, MVP with mitral regurgitation, HLD, and GERD, who presents for follow up.  She has a history of Afib managed with  flecainide . She is on Eliquis  for anticoagulation.  Her last ischemic evaluation was a NST in 2017 to rule out pro arrhythmia and the ischemic heart disease, there was moderate sized and intensity partially reversible anterior and apical defect, EF was 64%, likely representing shifting breast artifact, overall it was considered intermediate risk study. I personally reviewed the images and felt this was low risk. She has no anginal symptoms.  Her last echocardiogram in 2017 showed EF 55-60%, mild AI, mild MR, with peak PA pressures 36 mmHg.  She is followed by Neurology for Parkinson's disease.   She was admitted in late June with acute hypoxemic respiratory failure, sepsis secondary to pneumonia due to metapneumovirus:Presented with hypotension, lactic acidosis and confusion.  Treated symptomatically with bronchodilator therapy, IV fluids and antibiotics. Echo was done and was remarkable for MVP with severe MR, pulmonary HTN with estimated RV systolic pressure of 58 mm Hg. Normal LV function.  On follow up today she is at [facility]. Notes she is still weak and unable to walk. Getting PT - able to use a walker. Denies any chest pain, SOB, edema or palpitations.   Past Medical History:  Diagnosis Date   Abnormal mammogram of left breast    Likely benign microcalcifications--repeat L diag mammo 03/24/2017.  COMPLEX SCLEROSING LESION WITH CALCIFICATIONS --no sign of malignancy.  Excision recommended--pt has been referred to Dr.  Ebbie, who recommended f/u mammo and this was normal 12/2017--consider rpt 1 yr per rad.   Arthritis    DJD, back and both hips   Chronic low back pain without sciatica    Lumbar spondylosis + scoliosis.  Summer 2017 Dr. Bonner did ESI and pt got no relief.  Pt then saw Dr. Darlean [facility]: felt she had facet mediated pain; plan for B L345 MBB (??).   COVID 08/2022   Cystocele, midline    Diverticulosis    on colonoscopy 2004   GERD (gastroesophageal reflux disease)    Hx of cardiovascular stress test    Lexiscan  Myoview  (2/14):  Low risk, small ant defect likely breast attenuation, no ischemia, EF 69% (no change from 2010).     Hyperlipidemia    Hypertension    per pt   Idiopathic scoliosis of thoracolumbar region    Lumbar back pain    Mitral regurgitation    Echo (2/14):  EF 60-65%, Gr 1 DD, mild AI, mild bileaflet MVP, mod post directed MR, mild LAE, PASP 35.   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  Responding to gabapentin  (Dr. Chalice).   Osteoporosis, senile    stable left hip 2015 (took fosamax  x 42yrs, T-score -2.7 when she stopped bisphosphonate).  Rpt DEXA 18mo later (09/2016) T-score -.3.1 (different machine, though). 11/2019 DEXA T-score -4. Recommended restart of fosamax    PAF (paroxysmal atrial fibrillation) (HCC)    Dr. [name]: Flecainide , metopr, apxiaban.  08/2017-->persistent  a-fib w/RVR, flecainide  increased and she converted back to NSR and felt better.  100mg  bid flecainide  continued.   Parkinson's disease (HCC) 2020 dx   started sinemet  05/2019 (Dr. Evonnie)   Peripheral neuropathy 2008   Idiopathic vs familial (motor>sensory) vs lumbar disc dz: gabapentin  helpful   Shoulder pain left   RC surg 09/2014   Unsteady gait     Past Surgical History:  Procedure Laterality Date   BREAST BIOPSY  04/10/2017   Left breast core needle biopsy of calcifications.  COMPLEX  SCLEROSING LESION WITH CALCIFICATIONS --no sign of malignancy.  Excision recommended--pt has been referred to Dr. Ebbie, who recommended rpt mammo and this was NORMAL 12/2017.   BREAST EXCISIONAL BIOPSY Right 2010   Benign   BREAST SURGERY  Nov 2010   Benign biopsy, right   CHOLECYSTECTOMY  03/2010   Dr.Gross   COLONOSCOPY  10/03/2002   No polyps.  Repeat 10 yrs recommended but pt declines.  Pt declined cologuard 08/2016 but changed her mind and this test was NEG on 03/23/17.   COMBINED HYSTERECTOMY VAGINAL / OOPHORECTOMY / A&P REPAIR  1990   uncertain if ovaries were removed or not   CYSTOCELE REPAIR     DEXA  11/2019   T score -4   Lumpectomy  06/2009   Fatty Necrosis   PFT's  2014   NORMAL   REVERSE SHOULDER ARTHROPLASTY Right 03/11/2019   Procedure: REVERSE TOTAL SHOULDER ARTHROPLASTY;  Surgeon: Kay Kemps, MD;  Location: [facility];  Service: Orthopedics;  Laterality: Right;  Intersclene block   ROTATOR CUFF REPAIR Left 10/02/14   Dr. Kay   TONSILLECTOMY     TOTAL KNEE ARTHROPLASTY  09/2009   Right ;Dr Ernie   TOTAL KNEE ARTHROPLASTY Left 03/10/2016   Procedure: TOTAL KNEE ARTHROPLASTY;  Surgeon: Donnice Ernie, MD;  Location: [facility];  Service: Orthopedics;  Laterality: Left;   TRANSTHORACIC ECHOCARDIOGRAM  09/2012; 09/27/15   2014: EF 60-65%, Gr 1 DD, mild AI, mild bileaflet MVP, mod post directed MR, mild LAE, PASP 35.  Repeat 2017: EF 55-60%, mild AR, mild MVP and mild MV regurg.   VAGINAL HYSTERECTOMY     For Uterine Deviation      Current Outpatient Medications  Medication Sig Dispense Refill   acetaminophen  (TYLENOL ) 325 MG tablet Take 650 mg by mouth 2 (two) times daily.     betamethasone dipropionate 0.05 % lotion Apply topically as needed.     Biotin  5000 MCG CAPS Take 5,000 mcg by mouth daily.     Calcium  Carbonate (CALCIUM  600 PO) Take 600 mg by mouth 2 (two) times daily.      carbidopa -levodopa  (SINEMET  CR) 50-200 MG tablet TAKE 1 TABLET BY MOUTH EVERYDAY AT  BEDTIME 90 tablet 1   carbidopa -levodopa  (SINEMET  IR) 25-100 MG tablet TAKE 2 TABS AT 8AM, TAKE 2 TABS AT 11AM, TAKE 2 TABS AT 2PM, TAKE 1 TAB AT 5PM 630 tablet 0   cyanocobalamin  (VITAMIN B12) 1000 MCG/ML injection INJECT 1 ML (1,000 MCG) INTRAMUSCULARLY EVERY 30 DAYS 3 mL 6   ELIQUIS  5 MG TABS tablet TAKE 1 TABLET BY MOUTH TWICE A DAY 180 tablet 3   flecainide  (TAMBOCOR ) 100 MG tablet Take 1 tablet (100 mg total) by mouth 2 (two) times daily. 180 tablet 3   gabapentin  (NEURONTIN ) 300 MG capsule TAKE 2 CAPSULES BY MOUTH TWICE A DAY 360 capsule 1   guaiFENesin  (MUCINEX ) 600 MG 12 hr tablet Take 1 tablet (600 mg total) by mouth 2 (two)  times daily.     metoprolol  tartrate (LOPRESSOR ) 25 MG tablet Take 1 tablet (25 mg total) by mouth 2 (two) times daily. 180 tablet 3   Omega-3 Fatty Acids (FISH OIL ) 1200 MG CAPS Take 1,000 mg by mouth 2 (two) times daily.      pravastatin  (PRAVACHOL ) 40 MG tablet TAKE 1 TABLET BY MOUTH EVERY DAY 90 tablet 3   raloxifene  (EVISTA ) 60 MG tablet TAKE 1 TABLET BY MOUTH EVERY DAY 90 tablet 0   senna (SENOKOT) 8.6 MG TABS tablet Take 1 tablet by mouth daily as needed for mild constipation.     traZODone  (DESYREL ) 100 MG tablet TAKE 1 TABLET BY MOUTH EVERYDAY AT BEDTIME 90 tablet 0   nitroGLYCERIN  (NITROSTAT ) 0.4 MG SL tablet PLACE 1 TABLET UNDER THE TONGUE EVERY 5 MINUTES AS NEEDED FOR CHEST PAIN. (Patient not taking: Reported on 04/01/2024) 25 tablet 1   No current facility-administered medications for this visit.    Allergies:   Sulfa antibiotics and Tizanidine     Social History:  The patient  reports that she has never smoked. She has never used smokeless tobacco. She reports that she does not drink alcohol and does not use drugs.   Family History:  The patient's family history includes COPD in her brother; Coronary artery disease in her brother; Healthy in her daughter and son; Heart attack in her father; Heart attack (age of onset: 21) in her son; Ovarian cancer in  her mother; Stroke (age of onset: 37) in her father.    ROS:  Please see the history of present illness.   Otherwise, review of systems are positive for none.   All other systems are reviewed and negative.    PHYSICAL EXAM: VS:  BP (!) 124/56 (BP Location: Left Arm, Patient Position: Sitting, Cuff Size: Normal)   Pulse (!) 55   Ht 5' 1 (1.549 m)   Wt 134 lb (60.8 kg)   SpO2 94%   BMI 25.32 kg/m  , BMI Body mass index is 25.32 kg/m. GEN: Well nourished, well developed, in no acute distress HEENT: normal Neck: no JVD, carotid bruits, or masses Cardiac: bradycardic, regular rhythm, gr 2/6  systolic murmur at apex Respiratory:  clear to auscultation bilaterally, normal work of breathing GI: soft, nontender, nondistended, + BS MS: no deformity or atrophy Skin: warm and dry, no rash Neuro:  Strength and sensation are intact Psych: euthymic mood, full affect       Recent Labs: 01/26/2024: B Natriuretic Peptide 338.0 01/29/2024: Magnesium  2.2 02/03/2024: ALT 9; BUN 12; Creatinine 0.8; Hemoglobin 11.7; Platelets 229; Potassium 4.3; Sodium 138; TSH 1.50    Lipid Panel    Component Value Date/Time   CHOL 154 09/18/2023 1343   CHOL 211 (H) 04/28/2014 1026   TRIG 99.0 09/18/2023 1343   TRIG 95 04/28/2014 1026   HDL 71.80 09/18/2023 1343   HDL 101 04/28/2014 1026   CHOLHDL 2 09/18/2023 1343   VLDL 19.8 09/18/2023 1343   LDLCALC 62 09/18/2023 1343   LDLCALC 91 04/28/2014 1026   LDLDIRECT 87.5 09/07/2012 1022      Wt Readings from Last 3 Encounters:  04/01/24 134 lb (60.8 kg)  03/11/24 137 lb 6.4 oz (62.3 kg)  01/26/24 141 lb 8.6 oz (64.2 kg)      Other studies Reviewed: Additional studies/ records that were reviewed today include:     Echo 09/27/2015 LV EF: 55% -   60%   Study Conclusions   - Left ventricle: The cavity  size was normal. There was mild   concentric hypertrophy. Systolic function was normal. The   estimated ejection fraction was in the range of 55% to  60%. - Aortic valve: There was mild regurgitation. - Mitral valve: Mild anterior leafelt prolapse. There was mild   regurgitation. - Atrial septum: No defect or patent foramen ovale was identified. - Pulmonary arteries: PA peak pressure: 36 mm Hg (S).     Myoview  10/18/2015 Study Highlights    The left ventricular ejection fraction is moderately decreased (30-44%). Nuclear stress EF: 64%. There was no ST segment deviation noted during stress. No T wave inversion was noted during stress. Defect 1: There is a medium defect of moderate severity. This is an intermediate risk study. Findings consistent with prior myocardial infarction with peri-infarct ischemia.   Moderate size and intensity partially reversible anterior and apical wall defect (SDS 4). LVEF 64% with normal wall motion. This likely represents shifting breast artifact given normal wall motion and LV function. Clinical correlation is advised. This is an intermediate risk study.    Echo 01/27/24:  IMPRESSIONS     1. Left ventricular ejection fraction, by estimation, is 60 to 65%. The  left ventricle has normal function. The left ventricle has no regional  wall motion abnormalities. There is mild left ventricular hypertrophy.  Left ventricular diastolic parameters  are indeterminate. The average left ventricular global longitudinal strain  is 15.8 %. The global longitudinal strain is abnormal.   2. Right ventricular systolic function is normal. The right ventricular  size is normal. There is moderately elevated pulmonary artery systolic  pressure. The estimated right ventricular systolic pressure is 58.4 mmHg.   3. Left atrial size was moderately dilated.   4. Right atrial size was mildly dilated.   5. There is severe 4+ mitral regurgitation around A3/P3. There is  calcification & thickening of the mitral valve leaflets with mild annular  calcification. There is mild prolapse. Consider TEE for further  evaluation.   6.  Tricuspid valve regurgitation is moderate to severe.   7. The aortic valve is normal in structure. There is mild calcification  of the aortic valve. Aortic valve regurgitation is mild. No aortic  stenosis is present.   8. The inferior vena cava is normal in size with greater than 50%  respiratory variability, suggesting right atrial pressure of 3 mmHg.    ASSESSMENT AND PLAN:  1.  Paroxysmal atrial fibrillation: well controlled on Flecainide  and metoprolol . No complaints of bleeding on eliquis  - Continue flecainide  for rhythm control - Continue metoprolol  for rate control - Continue eliquis  for stroke ppx  2. HLD: LDL 62 - Continue pravastatin  and omega 3  3. MVP with mitral regurgitation: severe MR on recent Echo with evidence of pulmonary HTN. This in the setting of PNA and sepsis.  - by exam MR doesn't sound severe. Will repeat Echo and follow.    Current medicines are reviewed at length with the patient today.  The patient does not have concerns regarding medicines.  The following changes have been made:  As above  Labs/ tests ordered today include:   No orders of the defined types were placed in this encounter.    Disposition:   FU 6 months   Signed, Fredric Slabach [name], MD  04/01/2024 12:11 PM

## 2024-04-01 ENCOUNTER — Encounter: Payer: Self-pay | Admitting: Cardiology

## 2024-04-01 ENCOUNTER — Ambulatory Visit: Attending: Cardiology | Admitting: Cardiology

## 2024-04-01 VITALS — BP 124/56 | HR 55 | Ht 61.0 in | Wt 134.0 lb

## 2024-04-01 DIAGNOSIS — I341 Nonrheumatic mitral (valve) prolapse: Secondary | ICD-10-CM | POA: Diagnosis not present

## 2024-04-01 DIAGNOSIS — I340 Nonrheumatic mitral (valve) insufficiency: Secondary | ICD-10-CM | POA: Diagnosis not present

## 2024-04-01 DIAGNOSIS — I480 Paroxysmal atrial fibrillation: Secondary | ICD-10-CM

## 2024-04-01 DIAGNOSIS — R2681 Unsteadiness on feet: Secondary | ICD-10-CM | POA: Diagnosis not present

## 2024-04-01 NOTE — Patient Instructions (Signed)
 Medication Instructions:  Continue same medications *If you need a refill on your cardiac medications before your next appointment, please call your pharmacy*  Lab Work: None ordered  Testing/Procedures: Echo   First available   Follow-Up: At [facility], you and your health needs are our priority.  As part of our continuing mission to provide you with exceptional heart care, our providers are all part of one team.  This team includes your primary Cardiologist (physician) and Advanced Practice Providers or APPs (Physician Assistants and Nurse Practitioners) who all work together to provide you with the care you need, when you need it.  Your next appointment:  6 months    Call in Oct to schedule Feb appointment     Provider:  Dr.Jordan    We recommend signing up for the patient portal called MyChart.  Sign up information is provided on this After Visit Summary.  MyChart is used to connect with patients for Virtual Visits (Telemedicine).  Patients are able to view lab/test results, encounter notes, upcoming appointments, etc.  Non-urgent messages can be sent to your provider as well.   To learn more about what you can do with MyChart, go to [email or web address].

## 2024-04-02 DIAGNOSIS — R2681 Unsteadiness on feet: Secondary | ICD-10-CM | POA: Diagnosis not present

## 2024-04-02 DIAGNOSIS — M6281 Muscle weakness (generalized): Secondary | ICD-10-CM | POA: Diagnosis not present

## 2024-04-05 DIAGNOSIS — M6281 Muscle weakness (generalized): Secondary | ICD-10-CM | POA: Diagnosis not present

## 2024-04-05 DIAGNOSIS — B351 Tinea unguium: Secondary | ICD-10-CM | POA: Diagnosis not present

## 2024-04-05 DIAGNOSIS — I7091 Generalized atherosclerosis: Secondary | ICD-10-CM | POA: Diagnosis not present

## 2024-04-05 DIAGNOSIS — R2681 Unsteadiness on feet: Secondary | ICD-10-CM | POA: Diagnosis not present

## 2024-04-06 ENCOUNTER — Telehealth: Payer: Self-pay

## 2024-04-06 NOTE — Telephone Encounter (Signed)
 Copied from CRM [id number]. Topic: Clinical - Medical Advice >> Apr 06, 2024  2:13 PM Viola F wrote: Reason for CRM: Patient daughter Arland wants to know if patient received a pneumonia vaccine in the past, her nursing home needs to know. Please call Arland at [phone] [facility])  Patient received Prevnar 13 12/25/14 and Pneumovax 23 09/07/12. LVM for return call

## 2024-04-07 ENCOUNTER — Ambulatory Visit (HOSPITAL_COMMUNITY)
Admission: RE | Admit: 2024-04-07 | Discharge: 2024-04-07 | Disposition: A | Discharge status: Home / Self Care | Source: Ambulatory Visit | Attending: Cardiology | Admitting: Cardiology

## 2024-04-07 DIAGNOSIS — I341 Nonrheumatic mitral (valve) prolapse: Secondary | ICD-10-CM | POA: Diagnosis not present

## 2024-04-07 DIAGNOSIS — I480 Paroxysmal atrial fibrillation: Secondary | ICD-10-CM | POA: Diagnosis not present

## 2024-04-07 DIAGNOSIS — G20A1 Parkinson's disease without dyskinesia, without mention of fluctuations: Secondary | ICD-10-CM | POA: Diagnosis not present

## 2024-04-07 DIAGNOSIS — I10 Essential (primary) hypertension: Secondary | ICD-10-CM | POA: Diagnosis not present

## 2024-04-07 DIAGNOSIS — R5381 Other malaise: Secondary | ICD-10-CM | POA: Diagnosis not present

## 2024-04-07 LAB — ECHOCARDIOGRAM COMPLETE
Area-P 1/2: 2.83 cm2
P 1/2 time: 485 ms
S' Lateral: 2.6 cm

## 2024-04-08 ENCOUNTER — Ambulatory Visit: Payer: Self-pay | Admitting: Cardiology

## 2024-04-12 DIAGNOSIS — R2681 Unsteadiness on feet: Secondary | ICD-10-CM | POA: Diagnosis not present

## 2024-04-12 DIAGNOSIS — M6281 Muscle weakness (generalized): Secondary | ICD-10-CM | POA: Diagnosis not present

## 2024-04-15 ENCOUNTER — Other Ambulatory Visit: Payer: Self-pay | Admitting: Neurology

## 2024-04-15 DIAGNOSIS — G20A1 Parkinson's disease without dyskinesia, without mention of fluctuations: Secondary | ICD-10-CM

## 2024-04-15 DIAGNOSIS — K5900 Constipation, unspecified: Secondary | ICD-10-CM | POA: Diagnosis not present

## 2024-04-15 DIAGNOSIS — R351 Nocturia: Secondary | ICD-10-CM

## 2024-04-15 DIAGNOSIS — G4700 Insomnia, unspecified: Secondary | ICD-10-CM

## 2024-04-15 DIAGNOSIS — G2581 Restless legs syndrome: Secondary | ICD-10-CM

## 2024-04-15 DIAGNOSIS — E785 Hyperlipidemia, unspecified: Secondary | ICD-10-CM | POA: Diagnosis not present

## 2024-04-15 DIAGNOSIS — G629 Polyneuropathy, unspecified: Secondary | ICD-10-CM | POA: Diagnosis not present

## 2024-04-15 DIAGNOSIS — I10 Essential (primary) hypertension: Secondary | ICD-10-CM | POA: Diagnosis not present

## 2024-04-19 DIAGNOSIS — R2681 Unsteadiness on feet: Secondary | ICD-10-CM | POA: Diagnosis not present

## 2024-04-19 DIAGNOSIS — M6281 Muscle weakness (generalized): Secondary | ICD-10-CM | POA: Diagnosis not present

## 2024-04-19 NOTE — Telephone Encounter (Signed)
 Daughter, Arland, is returning call. Please return call to [phone].

## 2024-04-22 DIAGNOSIS — G20A1 Parkinson's disease without dyskinesia, without mention of fluctuations: Secondary | ICD-10-CM | POA: Diagnosis not present

## 2024-04-22 DIAGNOSIS — R0782 Intercostal pain: Secondary | ICD-10-CM | POA: Diagnosis not present

## 2024-04-22 DIAGNOSIS — E538 Deficiency of other specified B group vitamins: Secondary | ICD-10-CM | POA: Diagnosis not present

## 2024-04-27 DIAGNOSIS — R2681 Unsteadiness on feet: Secondary | ICD-10-CM | POA: Diagnosis not present

## 2024-04-28 DIAGNOSIS — I10 Essential (primary) hypertension: Secondary | ICD-10-CM | POA: Diagnosis not present

## 2024-04-28 DIAGNOSIS — E785 Hyperlipidemia, unspecified: Secondary | ICD-10-CM | POA: Diagnosis not present

## 2024-04-28 DIAGNOSIS — I480 Paroxysmal atrial fibrillation: Secondary | ICD-10-CM | POA: Diagnosis not present

## 2024-05-01 DIAGNOSIS — R2681 Unsteadiness on feet: Secondary | ICD-10-CM | POA: Diagnosis not present

## 2024-05-02 ENCOUNTER — Ambulatory Visit: Admitting: Neurology

## 2024-05-03 DIAGNOSIS — M6281 Muscle weakness (generalized): Secondary | ICD-10-CM | POA: Diagnosis not present

## 2024-05-03 DIAGNOSIS — R2681 Unsteadiness on feet: Secondary | ICD-10-CM | POA: Diagnosis not present

## 2024-05-10 NOTE — Progress Notes (Signed)
 Pharmacy Quality Measure Review  This patient is appearing on a report for being at risk of failing the adherence measure for cholesterol (statin) medications this calendar year.   Medication: pravastatin  40 mg daily Last fill date: 04/30/2024 for 30 day supply  Insurance report was not up to date. No action needed at this time.   Woodie Jock, PharmD PGY1 Pharmacy Resident  05/10/2024

## 2024-05-13 ENCOUNTER — Other Ambulatory Visit: Payer: Medicare Other

## 2024-05-16 ENCOUNTER — Telehealth: Payer: Self-pay | Admitting: Cardiology

## 2024-05-16 DIAGNOSIS — I10 Essential (primary) hypertension: Secondary | ICD-10-CM | POA: Diagnosis not present

## 2024-05-16 DIAGNOSIS — G20A1 Parkinson's disease without dyskinesia, without mention of fluctuations: Secondary | ICD-10-CM | POA: Diagnosis not present

## 2024-05-16 DIAGNOSIS — I480 Paroxysmal atrial fibrillation: Secondary | ICD-10-CM | POA: Diagnosis not present

## 2024-05-16 DIAGNOSIS — K5900 Constipation, unspecified: Secondary | ICD-10-CM | POA: Diagnosis not present

## 2024-05-16 DIAGNOSIS — R001 Bradycardia, unspecified: Secondary | ICD-10-CM | POA: Diagnosis not present

## 2024-05-16 NOTE — Telephone Encounter (Signed)
 Spoke with pt's daughter per DPR. Daughter notified of parameters. Daughter verbalized understanding. All questions if any were answered.

## 2024-05-16 NOTE — Telephone Encounter (Signed)
 STAT if HR is under 50 or over 120  (normal HR is 60-100 beats per minute)  What is your heart rate? 30  Do you have a log of your heart rate readings (document readings)? no  Do you have any other symptoms? No    Pt daughter wanted to know if her mother could have a half dose of the metoprolol  and would like a c/b Please Advise

## 2024-05-16 NOTE — Telephone Encounter (Signed)
 Spoke with pt's daughter per [facility] regarding her heart rate and metoprolol . Daughter stated he got a call from the pt's nursing home that her heart rate was 30. The caretaker wanted to know if it was ok to give her a half tablet of metoprolol . Daughter was advised to have the caretaker hold the metoprolol  and monitor the pt. Daughter stated the pt's pulse typically runs low and she is not symptomatic. Pt was heading out to play bingo when the daughter last spoke with her. Daughter stated the caretaker was monitoring the pt's pulse and it had come up to 54. Daughter did not have any blood pressure readings to share. Daughter is interested in having some parameters for the metoprolol . Daughter was given ED precautions for the pt and told that her message would be sent to Dr. [name] for his suggestions. Daughter was told to have caretaker continue to monitor the pt.Daughter verbalized understanding. All questions if any were answered.

## 2024-05-17 NOTE — Addendum Note (Signed)
 Addended by: CHRISTIANNE CHANNING PARAS on: 05/17/2024 08:16 AM   Modules accepted: Orders

## 2024-05-26 ENCOUNTER — Ambulatory Visit: Admitting: Neurology

## 2024-07-04 ENCOUNTER — Encounter: Payer: Self-pay | Admitting: Cardiology

## 2024-07-04 NOTE — Telephone Encounter (Signed)
 Daughter is following up requesting a call back regarding this matter.

## 2024-07-05 NOTE — Telephone Encounter (Signed)
 Spoke to patient's daughter Arland.Dr.Jordan advised to stop metoprolol  for now.

## 2024-07-06 NOTE — Telephone Encounter (Signed)
 Med list updated

## 2024-07-07 ENCOUNTER — Encounter: Payer: Self-pay | Admitting: Cardiology

## 2024-07-07 NOTE — Telephone Encounter (Signed)
 Order to stop metoprolol  faxed to [facility] at fax # [phone].

## 2024-07-18 NOTE — Progress Notes (Unsigned)
 Cardiology Office Note   Date:  07/20/2024   ID:  Cassie White, DOB 1941/01/27, MRN [id number]  PCP:  Candise [name], MD  Cardiologist:  Armend Hochstatter, MD EP: None  Chief Complaint  Patient presents with   Atrial Fibrillation   mitral insufficiency]      History of Present Illness: Cassie White is a 83 y.o. female with PMH of paroxysmal atrial fibrillation, MVP with mitral regurgitation, HLD, and GERD, who presents for follow up.  She has a history of Afib managed with  flecainide . She is on Eliquis  for anticoagulation.  Her last ischemic evaluation was a NST in 2017 to rule out pro arrhythmia and the ischemic heart disease, there was moderate sized and intensity partially reversible anterior and apical defect, EF was 64%, likely representing shifting breast artifact, overall it was considered intermediate risk study. I personally reviewed the images and felt this was low risk. She has no anginal symptoms.  Her last echocardiogram in 2017 showed EF 55-60%, mild AI, mild MR, with peak PA pressures 36 mmHg.  She is followed by Neurology for Parkinson's disease.   She was admitted in late June with acute hypoxemic respiratory failure, sepsis secondary to pneumonia due to metapneumovirus:Presented with hypotension, lactic acidosis and confusion.  Treated symptomatically with bronchodilator therapy, IV fluids and antibiotics. Echo was done and was remarkable for MVP with severe MR, pulmonary HTN with estimated RV systolic pressure of 58 mm Hg. Normal LV function. She was in Afib at the time but converted to NSR. Noted post termination pauses. Metoprolol  dose was decreased to 25 mg bid and continued on Flecainide .   She is at [facility] Notes she is still weak and unable to walk. Gets around in wheelchair. Was having significant dizziness so we discontinued metoprolol  altogether and this did improve. Not aware of any Afib. Weight is down. BP is normal.  Denies any chest pain,  SOB, edema or palpitations.   Past Medical History:  Diagnosis Date   Abnormal mammogram of left breast    Likely benign microcalcifications--repeat L diag mammo 03/24/2017.  COMPLEX SCLEROSING LESION WITH CALCIFICATIONS --no sign of malignancy.  Excision recommended--pt has been referred to Dr. Ebbie, who recommended f/u mammo and this was normal 12/2017--consider rpt 1 yr per rad.   Arthritis    DJD, back and both hips   Chronic low back pain without sciatica    Lumbar spondylosis + scoliosis.  Summer 2017 Dr. Bonner did ESI and pt got no relief.  Pt then saw Dr. Darlean [facility]: felt she had facet mediated pain; plan for B L345 MBB (??).   COVID 08/2022   Cystocele, midline    Diverticulosis    on colonoscopy 2004   GERD (gastroesophageal reflux disease)    Hx of cardiovascular stress test    Lexiscan  Myoview  (2/14):  Low risk, small ant defect likely breast attenuation, no ischemia, EF 69% (no change from 2010).     Hyperlipidemia    Hypertension    per pt   Idiopathic scoliosis of thoracolumbar region    Lumbar back pain    Mitral regurgitation    Echo (2/14):  EF 60-65%, Gr 1 DD, mild AI, mild bileaflet MVP, mod post directed MR, mild LAE, PASP 35.   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  Responding to gabapentin  (Dr. Chalice).   Osteoporosis, senile    stable left  hip 2015 (took fosamax  x 80yrs, T-score -2.7 when she stopped bisphosphonate).  Rpt DEXA 13mo later (09/2016) T-score -.3.1 (different machine, though). 11/2019 DEXA T-score -4. Recommended restart of fosamax    PAF (paroxysmal atrial fibrillation) (HCC)    Dr. Terri Malerba: Flecainide , metopr, apxiaban.  08/2017-->persistent a-fib w/RVR, flecainide  increased and she converted back to NSR and felt better.  100mg  bid flecainide  continued.   Parkinson's disease (HCC) 2020 dx   started sinemet  05/2019 (Dr. Evonnie)   Peripheral  neuropathy 2008   Idiopathic vs familial (motor>sensory) vs lumbar disc dz: gabapentin  helpful   Shoulder pain left   RC surg 09/2014   Unsteady gait     Past Surgical History:  Procedure Laterality Date   BREAST BIOPSY  04/10/2017   Left breast core needle biopsy of calcifications.  COMPLEX SCLEROSING LESION WITH CALCIFICATIONS --no sign of malignancy.  Excision recommended--pt has been referred to Dr. Ebbie, who recommended rpt mammo and this was NORMAL 12/2017.   BREAST EXCISIONAL BIOPSY Right 2010   Benign   BREAST SURGERY  Nov 2010   Benign biopsy, right   CHOLECYSTECTOMY  03/2010   Dr.Gross   COLONOSCOPY  10/03/2002   No polyps.  Repeat 10 yrs recommended but pt declines.  Pt declined cologuard 08/2016 but changed her mind and this test was NEG on 03/23/17.   COMBINED HYSTERECTOMY VAGINAL / OOPHORECTOMY / A&P REPAIR  1990   uncertain if ovaries were removed or not   CYSTOCELE REPAIR     DEXA  11/2019   T score -4   Lumpectomy  06/2009   Fatty Necrosis   PFT's  2014   NORMAL   REVERSE SHOULDER ARTHROPLASTY Right 03/11/2019   Procedure: REVERSE TOTAL SHOULDER ARTHROPLASTY;  Surgeon: Kay Kemps, MD;  Location: [facility];  Service: Orthopedics;  Laterality: Right;  Intersclene block   ROTATOR CUFF REPAIR Left 10/02/14   Dr. Kay   TONSILLECTOMY     TOTAL KNEE ARTHROPLASTY  09/2009   Right ;Dr Ernie   TOTAL KNEE ARTHROPLASTY Left 03/10/2016   Procedure: TOTAL KNEE ARTHROPLASTY;  Surgeon: Donnice Ernie, MD;  Location: [facility];  Service: Orthopedics;  Laterality: Left;   TRANSTHORACIC ECHOCARDIOGRAM  09/2012; 09/27/15   2014: EF 60-65%, Gr 1 DD, mild AI, mild bileaflet MVP, mod post directed MR, mild LAE, PASP 35.  Repeat 2017: EF 55-60%, mild AR, mild MVP and mild MV regurg.   VAGINAL HYSTERECTOMY     For Uterine Deviation      Current Outpatient Medications  Medication Sig Dispense Refill   acetaminophen  (TYLENOL ) 325 MG tablet Take 650 mg by mouth 2 (two) times daily.      betamethasone dipropionate 0.05 % lotion Apply topically as needed.     Biotin  5000 MCG CAPS Take 5,000 mcg by mouth daily.     Calcium  Carbonate (CALCIUM  600 PO) Take 600 mg by mouth 2 (two) times daily.      carbidopa -levodopa  (SINEMET  CR) 50-200 MG tablet TAKE 1 TABLET BY MOUTH EVERYDAY AT BEDTIME 90 tablet 1   carbidopa -levodopa  (SINEMET  IR) 25-100 MG tablet TAKE 2 TABS AT 8AM, TAKE 2 TABS AT 11AM, TAKE 2 TABS AT 2PM, TAKE 1 TAB AT 5PM 630 tablet 0   cyanocobalamin  (VITAMIN B12) 1000 MCG/ML injection INJECT 1 ML (1,000 MCG) INTRAMUSCULARLY EVERY 30 DAYS 3 mL 6   ELIQUIS  5 MG TABS tablet TAKE 1 TABLET BY MOUTH TWICE A DAY 180 tablet 3   flecainide  (TAMBOCOR ) 100 MG tablet Take 1 tablet (100  mg total) by mouth 2 (two) times daily. 180 tablet 3   gabapentin  (NEURONTIN ) 300 MG capsule TAKE 2 CAPSULES BY MOUTH TWICE A DAY 360 capsule 1   guaiFENesin  (MUCINEX ) 600 MG 12 hr tablet Take 1 tablet (600 mg total) by mouth 2 (two) times daily.     nitroGLYCERIN  (NITROSTAT ) 0.4 MG SL tablet PLACE 1 TABLET UNDER THE TONGUE EVERY 5 MINUTES AS NEEDED FOR CHEST PAIN. (Patient taking differently: Place 0.4 mg under the tongue every 5 (five) minutes as needed for chest pain.) 25 tablet 1   Omega-3 Fatty Acids (FISH OIL ) 1200 MG CAPS Take 1,000 mg by mouth 2 (two) times daily.      pravastatin  (PRAVACHOL ) 40 MG tablet TAKE 1 TABLET BY MOUTH EVERY DAY 90 tablet 3   raloxifene  (EVISTA ) 60 MG tablet TAKE 1 TABLET BY MOUTH EVERY DAY 90 tablet 0   senna (SENOKOT) 8.6 MG TABS tablet Take 1 tablet by mouth daily as needed for mild constipation.     traZODone  (DESYREL ) 100 MG tablet TAKE 1 TABLET BY MOUTH EVERYDAY AT BEDTIME 90 tablet 0   No current facility-administered medications for this visit.    Allergies:   Sulfa antibiotics and Tizanidine     Social History:  The patient  reports that she has never smoked. She has never used smokeless tobacco. She reports that she does not drink alcohol and does not use drugs.    Family History:  The patient's family history includes COPD in her brother; Coronary artery disease in her brother; Healthy in her daughter and son; Heart attack in her father; Heart attack (age of onset: 11) in her son; Ovarian cancer in her mother; Stroke (age of onset: 60) in her father.    ROS:  Please see the history of present illness.   Otherwise, review of systems are positive for none.   All other systems are reviewed and negative.    PHYSICAL EXAM: VS:  BP 118/64   Pulse 74   Ht 5' 1 (1.549 m)   Wt 130 lb (59 kg)   SpO2 95%   BMI 24.56 kg/m  , BMI Body mass index is 24.56 kg/m. GEN: Well nourished, well developed, in no acute distress HEENT: normal Neck: no JVD, carotid bruits, or masses Cardiac: RRR, gr 2/6  systolic murmur at apex Respiratory:  clear to auscultation bilaterally, normal work of breathing GI: soft, nontender, nondistended, + BS MS: no deformity or atrophy Skin: warm and dry, no rash Neuro:  Strength and sensation are intact Psych: euthymic mood, full affect       Recent Labs: 01/26/2024: B Natriuretic Peptide 338.0 01/29/2024: Magnesium  2.2 02/03/2024: ALT 9; BUN 12; Creatinine 0.8; Hemoglobin 11.7; Platelets 229; Potassium 4.3; Sodium 138; TSH 1.50    Lipid Panel    Component Value Date/Time   CHOL 154 09/18/2023 1343   CHOL 211 (H) 04/28/2014 1026   TRIG 99.0 09/18/2023 1343   TRIG 95 04/28/2014 1026   HDL 71.80 09/18/2023 1343   HDL 101 04/28/2014 1026   CHOLHDL 2 09/18/2023 1343   VLDL 19.8 09/18/2023 1343   LDLCALC 62 09/18/2023 1343   LDLCALC 91 04/28/2014 1026   LDLDIRECT 87.5 09/07/2012 1022      Wt Readings from Last 3 Encounters:  07/20/24 130 lb (59 kg)  04/01/24 134 lb (60.8 kg)  03/11/24 137 lb 6.4 oz (62.3 kg)      Other studies Reviewed: Additional studies/ records that were reviewed today include:  Echo 09/27/2015 LV EF: 55% -   60%   Study Conclusions   - Left ventricle: The cavity size was normal.  There was mild   concentric hypertrophy. Systolic function was normal. The   estimated ejection fraction was in the range of 55% to 60%. - Aortic valve: There was mild regurgitation. - Mitral valve: Mild anterior leafelt prolapse. There was mild   regurgitation. - Atrial septum: No defect or patent foramen ovale was identified. - Pulmonary arteries: PA peak pressure: 36 mm Hg (S).     Myoview  10/18/2015 Study Highlights    The left ventricular ejection fraction is moderately decreased (30-44%). Nuclear stress EF: 64%. There was no ST segment deviation noted during stress. No T wave inversion was noted during stress. Defect 1: There is a medium defect of moderate severity. This is an intermediate risk study. Findings consistent with prior myocardial infarction with peri-infarct ischemia.   Moderate size and intensity partially reversible anterior and apical wall defect (SDS 4). LVEF 64% with normal wall motion. This likely represents shifting breast artifact given normal wall motion and LV function. Clinical correlation is advised. This is an intermediate risk study.    Echo 01/27/24:  IMPRESSIONS     1. Left ventricular ejection fraction, by estimation, is 60 to 65%. The  left ventricle has normal function. The left ventricle has no regional  wall motion abnormalities. There is mild left ventricular hypertrophy.  Left ventricular diastolic parameters  are indeterminate. The average left ventricular global longitudinal strain  is 15.8 %. The global longitudinal strain is abnormal.   2. Right ventricular systolic function is normal. The right ventricular  size is normal. There is moderately elevated pulmonary artery systolic  pressure. The estimated right ventricular systolic pressure is 58.4 mmHg.   3. Left atrial size was moderately dilated.   4. Right atrial size was mildly dilated.   5. There is severe 4+ mitral regurgitation around A3/P3. There is  calcification & thickening of  the mitral valve leaflets with mild annular  calcification. There is mild prolapse. Consider TEE for further  evaluation.   6. Tricuspid valve regurgitation is moderate to severe.   7. The aortic valve is normal in structure. There is mild calcification  of the aortic valve. Aortic valve regurgitation is mild. No aortic  stenosis is present.   8. The inferior vena cava is normal in size with greater than 50%  respiratory variability, suggesting right atrial pressure of 3 mmHg.   Echo 04/07/24: IMPRESSIONS     1. Left ventricular ejection fraction, by estimation, is 60 to 65%. Left  ventricular ejection fraction by 3D volume is 66 %. The left ventricle has  normal function. The left ventricle has no regional wall motion  abnormalities. There is moderate  concentric left ventricular hypertrophy. The average left ventricular  global longitudinal strain is -19.4 %. The global longitudinal strain is  normal.   2. Right ventricular systolic function is normal. The right ventricular  size is normal.   3. Left atrial size was mildly dilated.   4. There is severe eccentric posteriorly directed mitral regurgitation  with prolapse of the anterior mitral valve leaflet. Consider TEE for  further evaluation.   5. Tricuspid valve regurgitation is mild to moderate.   6. The aortic valve is normal in structure. Aortic valve regurgitation is  mild. No aortic stenosis is present.   7. The inferior vena cava is normal in size with greater than 50%  respiratory variability, suggesting  right atrial pressure of 3 mmHg.   ASSESSMENT AND PLAN:  1.  Paroxysmal atrial fibrillation: well controlled on Flecainide   - metoprolol  discontinued due to symptoms of dizziness and this has improved. Noted to have post termination pauses in hospital.continue Flecainide  alone. - No complaints of bleeding on eliquis  - Continue flecainide  for rhythm control - Continue eliquis  for stroke ppx  2. HLD: LDL 62 - Continue  pravastatin  and omega 3  3. MVP with mitral regurgitation: moderate to severe MR on last Echo.  - she has no significant symptoms of CHF. Activity is very limited. Not a candidate for MV repair surgically. If she were to develop significant symptoms could consider Mitraclip but not indicated currently.     No orders of the defined types were placed in this encounter.    Disposition:   FU 6 months   Signed, Zahlia Deshazer, MD  07/20/2024 9:53 AM

## 2024-07-19 ENCOUNTER — Encounter: Payer: Self-pay | Admitting: Pharmacist

## 2024-07-19 NOTE — Progress Notes (Signed)
 Pharmacy Quality Measure Review  This patient is appearing on a report for being at risk of failing the adherence measure for cholesterol (statin) medications this calendar year.   Medication: pravastatin   Last fill date: 06/28/2024 for 30 day supply. Patient is using adherence packs from [employer] and has filled every 30 days since 02/28/2024  Fill dates in 2025:  03/26 - 90 days 07/27, 08/23, 09/27, 10/27, 11/25 - 30 days Currently has filled 240/280 days = 85.7%  Insurance report was not up to date. No action needed at this time.   Madelin Ray, PharmD Clinical Pharmacist [facility]  Population Health [phone]

## 2024-07-20 ENCOUNTER — Ambulatory Visit: Attending: Cardiology | Admitting: Cardiology

## 2024-07-20 ENCOUNTER — Encounter: Payer: Self-pay | Admitting: Cardiology

## 2024-07-20 VITALS — BP 118/64 | HR 74 | Ht 61.0 in | Wt 130.0 lb

## 2024-07-20 DIAGNOSIS — I341 Nonrheumatic mitral (valve) prolapse: Secondary | ICD-10-CM | POA: Diagnosis not present

## 2024-07-20 DIAGNOSIS — I480 Paroxysmal atrial fibrillation: Secondary | ICD-10-CM | POA: Diagnosis not present

## 2024-07-20 DIAGNOSIS — I340 Nonrheumatic mitral (valve) insufficiency: Secondary | ICD-10-CM

## 2024-07-20 NOTE — Patient Instructions (Addendum)
 Medication Instructions:  Stop Toprol  Continue all other medications *If you need a refill on your cardiac medications before your next appointment, please call your pharmacy*  Lab Work: None ordered  Testing/Procedures: None ordered  Follow-Up: At [facility], you and your health needs are our priority.  As part of our continuing mission to provide you with exceptional heart care, our providers are all part of one team.  This team includes your primary Cardiologist (physician) and Advanced Practice Providers or APPs (Physician Assistants and Nurse Practitioners) who all work together to provide you with the care you need, when you need it.  Your next appointment:  6 months   Call in April to schedule June appointment     Provider:  Dr.Jordan   We recommend signing up for the patient portal called MyChart.  Sign up information is provided on this After Visit Summary.  MyChart is used to connect with patients for Virtual Visits (Telemedicine).  Patients are able to view lab/test results, encounter notes, upcoming appointments, etc.  Non-urgent messages can be sent to your provider as well.   To learn more about what you can do with MyChart, go to [email or web address].

## 2024-09-09 NOTE — Progress Notes (Unsigned)
 "    Cardiology Office Note   Date:  09/09/2024   ID:  Cassie White, DOB 1941/06/11, MRN [id number]  PCP:  Candise [name], MD  Cardiologist:  Chaylee Ehrsam, MD EP: None  No chief complaint on file.     History of Present Illness: Cassie White is a 84 y.o. female with PMH of paroxysmal atrial fibrillation, MVP with mitral regurgitation, HLD, and GERD, who presents for follow up.  She has a history of Afib managed with  flecainide . She is on Eliquis  for anticoagulation.  Her last ischemic evaluation was a NST in 2017 to rule out pro arrhythmia and the ischemic heart disease, there was moderate sized and intensity partially reversible anterior and apical defect, EF was 64%, likely representing shifting breast artifact, overall it was considered intermediate risk study. I personally reviewed the images and felt this was low risk. She has no anginal symptoms.  Her last echocardiogram in 2017 showed EF 55-60%, mild AI, mild MR, with peak PA pressures 36 mmHg.  She is followed by Neurology for Parkinson's disease.   She was admitted in late June with acute hypoxemic respiratory failure, sepsis secondary to pneumonia due to metapneumovirus:Presented with hypotension, lactic acidosis and confusion.  Treated symptomatically with bronchodilator therapy, IV fluids and antibiotics. Echo was done and was remarkable for MVP with severe MR, pulmonary HTN with estimated RV systolic pressure of 58 mm Hg. Normal LV function. She was in Afib at the time but converted to NSR. Noted post termination pauses. Metoprolol  dose was decreased to 25 mg bid and continued on Flecainide .   She is at [facility]. Notes she is still weak and unable to walk. Gets around in wheelchair. Was having significant dizziness so we discontinued metoprolol  altogether and this did improve. Not aware of any Afib. Weight is down. BP is normal.  Denies any chest pain, SOB, edema or palpitations.   Past Medical History:   Diagnosis Date   Abnormal mammogram of left breast    Likely benign microcalcifications--repeat L diag mammo 03/24/2017.  COMPLEX SCLEROSING LESION WITH CALCIFICATIONS --no sign of malignancy.  Excision recommended--pt has been referred to Dr. Ebbie, who recommended f/u mammo and this was normal 12/2017--consider rpt 1 yr per rad.   Arthritis    DJD, back and both hips   Chronic low back pain without sciatica    Lumbar spondylosis + scoliosis.  Summer 2017 Dr. Bonner did ESI and pt got no relief.  Pt then saw Dr. Darlean [facility]: felt she had facet mediated pain; plan for B L345 MBB (??).   COVID 08/2022   Cystocele, midline    Diverticulosis    on colonoscopy 2004   GERD (gastroesophageal reflux disease)    Hx of cardiovascular stress test    Lexiscan  Myoview  (2/14):  Low risk, small ant defect likely breast attenuation, no ischemia, EF 69% (no change from 2010).     Hyperlipidemia    Hypertension    per pt   Idiopathic scoliosis of thoracolumbar region    Lumbar back pain    Mitral regurgitation    Echo (2/14):  EF 60-65%, Gr 1 DD, mild AI, mild bileaflet MVP, mod post directed MR, mild LAE, PASP 35.   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  Responding to gabapentin  (Dr. Chalice).   Osteoporosis, senile    stable left hip 2015 (took fosamax  x 34yrs, T-score -2.7  when she stopped bisphosphonate).  Rpt DEXA 9mo later (09/2016) T-score -.3.1 (different machine, though). 11/2019 DEXA T-score -4. Recommended restart of fosamax    PAF (paroxysmal atrial fibrillation) (HCC)    Dr. Deliliah Spranger: Flecainide , metopr, apxiaban.  08/2017-->persistent a-fib w/RVR, flecainide  increased and she converted back to NSR and felt better.  100mg  bid flecainide  continued.   Parkinson's disease (HCC) 2020 dx   started sinemet  05/2019 (Dr. Evonnie)   Peripheral neuropathy 2008   Idiopathic vs familial (motor>sensory)  vs lumbar disc dz: gabapentin  helpful   Shoulder pain left   RC surg 09/2014   Unsteady gait     Past Surgical History:  Procedure Laterality Date   BREAST BIOPSY  04/10/2017   Left breast core needle biopsy of calcifications.  COMPLEX SCLEROSING LESION WITH CALCIFICATIONS --no sign of malignancy.  Excision recommended--pt has been referred to Dr. Ebbie, who recommended rpt mammo and this was NORMAL 12/2017.   BREAST EXCISIONAL BIOPSY Right 2010   Benign   BREAST SURGERY  Nov 2010   Benign biopsy, right   CHOLECYSTECTOMY  03/2010   Dr.Gross   COLONOSCOPY  10/03/2002   No polyps.  Repeat 10 yrs recommended but pt declines.  Pt declined cologuard 08/2016 but changed her mind and this test was NEG on 03/23/17.   COMBINED HYSTERECTOMY VAGINAL / OOPHORECTOMY / A&P REPAIR  1990   uncertain if ovaries were removed or not   CYSTOCELE REPAIR     DEXA  11/2019   T score -4   Lumpectomy  06/2009   Fatty Necrosis   PFT's  2014   NORMAL   REVERSE SHOULDER ARTHROPLASTY Right 03/11/2019   Procedure: REVERSE TOTAL SHOULDER ARTHROPLASTY;  Surgeon: Kay Kemps, MD;  Location: [facility];  Service: Orthopedics;  Laterality: Right;  Intersclene block   ROTATOR CUFF REPAIR Left 10/02/14   Dr. Kay   TONSILLECTOMY     TOTAL KNEE ARTHROPLASTY  09/2009   Right ;Dr Ernie   TOTAL KNEE ARTHROPLASTY Left 03/10/2016   Procedure: TOTAL KNEE ARTHROPLASTY;  Surgeon: Donnice Ernie, MD;  Location: [facility];  Service: Orthopedics;  Laterality: Left;   TRANSTHORACIC ECHOCARDIOGRAM  09/2012; 09/27/15   2014: EF 60-65%, Gr 1 DD, mild AI, mild bileaflet MVP, mod post directed MR, mild LAE, PASP 35.  Repeat 2017: EF 55-60%, mild AR, mild MVP and mild MV regurg.   VAGINAL HYSTERECTOMY     For Uterine Deviation      Current Outpatient Medications  Medication Sig Dispense Refill   acetaminophen  (TYLENOL ) 325 MG tablet Take 650 mg by mouth 2 (two) times daily.     betamethasone dipropionate 0.05 % lotion Apply topically as  needed.     Biotin  5000 MCG CAPS Take 5,000 mcg by mouth daily.     Calcium  Carbonate (CALCIUM  600 PO) Take 600 mg by mouth 2 (two) times daily.      carbidopa -levodopa  (SINEMET  CR) 50-200 MG tablet TAKE 1 TABLET BY MOUTH EVERYDAY AT BEDTIME 90 tablet 1   carbidopa -levodopa  (SINEMET  IR) 25-100 MG tablet TAKE 2 TABS AT 8AM, TAKE 2 TABS AT 11AM, TAKE 2 TABS AT 2PM, TAKE 1 TAB AT 5PM 630 tablet 0   cyanocobalamin  (VITAMIN B12) 1000 MCG/ML injection INJECT 1 ML (1,000 MCG) INTRAMUSCULARLY EVERY 30 DAYS 3 mL 6   ELIQUIS  5 MG TABS tablet TAKE 1 TABLET BY MOUTH TWICE A DAY 180 tablet 3   flecainide  (TAMBOCOR ) 100 MG tablet Take 1 tablet (100 mg total) by mouth 2 (two) times daily.  180 tablet 3   gabapentin  (NEURONTIN ) 300 MG capsule TAKE 2 CAPSULES BY MOUTH TWICE A DAY 360 capsule 1   guaiFENesin  (MUCINEX ) 600 MG 12 hr tablet Take 1 tablet (600 mg total) by mouth 2 (two) times daily.     nitroGLYCERIN  (NITROSTAT ) 0.4 MG SL tablet PLACE 1 TABLET UNDER THE TONGUE EVERY 5 MINUTES AS NEEDED FOR CHEST PAIN. (Patient taking differently: Place 0.4 mg under the tongue every 5 (five) minutes as needed for chest pain.) 25 tablet 1   Omega-3 Fatty Acids (FISH OIL ) 1200 MG CAPS Take 1,000 mg by mouth 2 (two) times daily.      pravastatin  (PRAVACHOL ) 40 MG tablet TAKE 1 TABLET BY MOUTH EVERY DAY 90 tablet 3   raloxifene  (EVISTA ) 60 MG tablet TAKE 1 TABLET BY MOUTH EVERY DAY 90 tablet 0   senna (SENOKOT) 8.6 MG TABS tablet Take 1 tablet by mouth daily as needed for mild constipation.     traZODone  (DESYREL ) 100 MG tablet TAKE 1 TABLET BY MOUTH EVERYDAY AT BEDTIME 90 tablet 0   No current facility-administered medications for this visit.    Allergies:   Sulfa antibiotics and Tizanidine     Social History:  The patient  reports that she has never smoked. She has never used smokeless tobacco. She reports that she does not drink alcohol and does not use drugs.   Family History:  The patient's family history includes  COPD in her brother; Coronary artery disease in her brother; Healthy in her daughter and son; Heart attack in her father; Heart attack (age of onset: 73) in her son; Ovarian cancer in her mother; Stroke (age of onset: 60) in her father.    ROS:  Please see the history of present illness.   Otherwise, review of systems are positive for none.   All other systems are reviewed and negative.    PHYSICAL EXAM: VS:  There were no vitals taken for this visit. , BMI There is no height or weight on file to calculate BMI. GEN: Well nourished, well developed, in no acute distress HEENT: normal Neck: no JVD, carotid bruits, or masses Cardiac: RRR, gr 2/6  systolic murmur at apex Respiratory:  clear to auscultation bilaterally, normal work of breathing GI: soft, nontender, nondistended, + BS MS: no deformity or atrophy Skin: warm and dry, no rash Neuro:  Strength and sensation are intact Psych: euthymic mood, full affect       Recent Labs: 01/26/2024: B Natriuretic Peptide 338.0 01/29/2024: Magnesium  2.2 02/03/2024: ALT 9; BUN 12; Creatinine 0.8; Hemoglobin 11.7; Platelets 229; Potassium 4.3; Sodium 138; TSH 1.50    Lipid Panel    Component Value Date/Time   CHOL 154 09/18/2023 1343   CHOL 211 (H) 04/28/2014 1026   TRIG 99.0 09/18/2023 1343   TRIG 95 04/28/2014 1026   HDL 71.80 09/18/2023 1343   HDL 101 04/28/2014 1026   CHOLHDL 2 09/18/2023 1343   VLDL 19.8 09/18/2023 1343   LDLCALC 62 09/18/2023 1343   LDLCALC 91 04/28/2014 1026   LDLDIRECT 87.5 09/07/2012 1022      Wt Readings from Last 3 Encounters:  07/20/24 130 lb (59 kg)  04/01/24 134 lb (60.8 kg)  03/11/24 137 lb 6.4 oz (62.3 kg)      Other studies Reviewed: Additional studies/ records that were reviewed today include:     Echo 09/27/2015 LV EF: 55% -   60%   Study Conclusions   - Left ventricle: The cavity size was normal. There  was mild   concentric hypertrophy. Systolic function was normal. The   estimated  ejection fraction was in the range of 55% to 60%. - Aortic valve: There was mild regurgitation. - Mitral valve: Mild anterior leafelt prolapse. There was mild   regurgitation. - Atrial septum: No defect or patent foramen ovale was identified. - Pulmonary arteries: PA peak pressure: 36 mm Hg (S).     Myoview  10/18/2015 Study Highlights    The left ventricular ejection fraction is moderately decreased (30-44%). Nuclear stress EF: 64%. There was no ST segment deviation noted during stress. No T wave inversion was noted during stress. Defect 1: There is a medium defect of moderate severity. This is an intermediate risk study. Findings consistent with prior myocardial infarction with peri-infarct ischemia.   Moderate size and intensity partially reversible anterior and apical wall defect (SDS 4). LVEF 64% with normal wall motion. This likely represents shifting breast artifact given normal wall motion and LV function. Clinical correlation is advised. This is an intermediate risk study.    Echo 01/27/24:  IMPRESSIONS     1. Left ventricular ejection fraction, by estimation, is 60 to 65%. The  left ventricle has normal function. The left ventricle has no regional  wall motion abnormalities. There is mild left ventricular hypertrophy.  Left ventricular diastolic parameters  are indeterminate. The average left ventricular global longitudinal strain  is 15.8 %. The global longitudinal strain is abnormal.   2. Right ventricular systolic function is normal. The right ventricular  size is normal. There is moderately elevated pulmonary artery systolic  pressure. The estimated right ventricular systolic pressure is 58.4 mmHg.   3. Left atrial size was moderately dilated.   4. Right atrial size was mildly dilated.   5. There is severe 4+ mitral regurgitation around A3/P3. There is  calcification & thickening of the mitral valve leaflets with mild annular  calcification. There is mild prolapse.  Consider TEE for further  evaluation.   6. Tricuspid valve regurgitation is moderate to severe.   7. The aortic valve is normal in structure. There is mild calcification  of the aortic valve. Aortic valve regurgitation is mild. No aortic  stenosis is present.   8. The inferior vena cava is normal in size with greater than 50%  respiratory variability, suggesting right atrial pressure of 3 mmHg.   Echo 04/07/24: IMPRESSIONS     1. Left ventricular ejection fraction, by estimation, is 60 to 65%. Left  ventricular ejection fraction by 3D volume is 66 %. The left ventricle has  normal function. The left ventricle has no regional wall motion  abnormalities. There is moderate  concentric left ventricular hypertrophy. The average left ventricular  global longitudinal strain is -19.4 %. The global longitudinal strain is  normal.   2. Right ventricular systolic function is normal. The right ventricular  size is normal.   3. Left atrial size was mildly dilated.   4. There is severe eccentric posteriorly directed mitral regurgitation  with prolapse of the anterior mitral valve leaflet. Consider TEE for  further evaluation.   5. Tricuspid valve regurgitation is mild to moderate.   6. The aortic valve is normal in structure. Aortic valve regurgitation is  mild. No aortic stenosis is present.   7. The inferior vena cava is normal in size with greater than 50%  respiratory variability, suggesting right atrial pressure of 3 mmHg.   ASSESSMENT AND PLAN:  1.  Paroxysmal atrial fibrillation: well controlled on Flecainide   - metoprolol   discontinued due to symptoms of dizziness and this has improved. Noted to have post termination pauses in hospital.continue Flecainide  alone. - No complaints of bleeding on eliquis  - Continue flecainide  for rhythm control - Continue eliquis  for stroke ppx  2. HLD: LDL 62 - Continue pravastatin  and omega 3  3. MVP with mitral regurgitation: moderate to severe MR on  last Echo.  - she has no significant symptoms of CHF. Activity is very limited. Not a candidate for MV repair surgically. If she were to develop significant symptoms could consider Mitraclip but not indicated currently.     No orders of the defined types were placed in this encounter.    Disposition:   FU 6 months   Signed, Liylah Najarro, MD  09/09/2024 7:38 AM    "

## 2024-09-12 ENCOUNTER — Ambulatory Visit: Admitting: Cardiology

## 2024-09-14 ENCOUNTER — Ambulatory Visit: Admitting: Family Medicine

## 2024-09-29 ENCOUNTER — Ambulatory Visit: Admitting: Family Medicine

## 2024-11-10 ENCOUNTER — Ambulatory Visit: Admitting: Neurology
